# Patient Record
Sex: Male | Born: 1935 | Race: White | Hispanic: No | Marital: Married | Smoking: Never smoker
Health system: Southern US, Community
[De-identification: ages and names within clinical notes are randomized; demographics above are authoritative.]

## PROBLEM LIST (undated history)

## (undated) DIAGNOSIS — Z950 Presence of cardiac pacemaker: Secondary | ICD-10-CM

## (undated) DIAGNOSIS — R7881 Bacteremia: Secondary | ICD-10-CM

## (undated) DIAGNOSIS — I4892 Unspecified atrial flutter: Secondary | ICD-10-CM

## (undated) DIAGNOSIS — Z9981 Dependence on supplemental oxygen: Secondary | ICD-10-CM

## (undated) DIAGNOSIS — I219 Acute myocardial infarction, unspecified: Secondary | ICD-10-CM

## (undated) DIAGNOSIS — J189 Pneumonia, unspecified organism: Secondary | ICD-10-CM

## (undated) DIAGNOSIS — I2510 Atherosclerotic heart disease of native coronary artery without angina pectoris: Secondary | ICD-10-CM

## (undated) DIAGNOSIS — K5790 Diverticulosis of intestine, part unspecified, without perforation or abscess without bleeding: Secondary | ICD-10-CM

## (undated) DIAGNOSIS — Z87442 Personal history of urinary calculi: Secondary | ICD-10-CM

## (undated) DIAGNOSIS — M1990 Unspecified osteoarthritis, unspecified site: Secondary | ICD-10-CM

## (undated) DIAGNOSIS — G629 Polyneuropathy, unspecified: Secondary | ICD-10-CM

## (undated) DIAGNOSIS — C61 Malignant neoplasm of prostate: Secondary | ICD-10-CM

## (undated) DIAGNOSIS — F32A Depression, unspecified: Secondary | ICD-10-CM

## (undated) DIAGNOSIS — T8859XA Other complications of anesthesia, initial encounter: Secondary | ICD-10-CM

## (undated) DIAGNOSIS — I10 Essential (primary) hypertension: Secondary | ICD-10-CM

## (undated) DIAGNOSIS — J45909 Unspecified asthma, uncomplicated: Secondary | ICD-10-CM

## (undated) DIAGNOSIS — I472 Ventricular tachycardia: Secondary | ICD-10-CM

## (undated) DIAGNOSIS — F329 Major depressive disorder, single episode, unspecified: Secondary | ICD-10-CM

## (undated) DIAGNOSIS — I5020 Unspecified systolic (congestive) heart failure: Secondary | ICD-10-CM

## (undated) DIAGNOSIS — D649 Anemia, unspecified: Secondary | ICD-10-CM

## (undated) DIAGNOSIS — E785 Hyperlipidemia, unspecified: Secondary | ICD-10-CM

## (undated) DIAGNOSIS — G4700 Insomnia, unspecified: Secondary | ICD-10-CM

## (undated) DIAGNOSIS — T4145XA Adverse effect of unspecified anesthetic, initial encounter: Secondary | ICD-10-CM

## (undated) DIAGNOSIS — K759 Inflammatory liver disease, unspecified: Secondary | ICD-10-CM

## (undated) DIAGNOSIS — K219 Gastro-esophageal reflux disease without esophagitis: Secondary | ICD-10-CM

## (undated) DIAGNOSIS — K449 Diaphragmatic hernia without obstruction or gangrene: Secondary | ICD-10-CM

## (undated) DIAGNOSIS — I739 Peripheral vascular disease, unspecified: Secondary | ICD-10-CM

## (undated) HISTORY — DX: Acute myocardial infarction, unspecified: I21.9

## (undated) HISTORY — PX: FRACTURE SURGERY: SHX138

## (undated) HISTORY — PX: COLONOSCOPY: SHX174

## (undated) HISTORY — DX: Polyneuropathy, unspecified: G62.9

## (undated) HISTORY — PX: CATARACT EXTRACTION W/ INTRAOCULAR LENS  IMPLANT, BILATERAL: SHX1307

## (undated) HISTORY — PX: TRANSURETHRAL RESECTION OF PROSTATE: SHX73

## (undated) HISTORY — PX: CORONARY ARTERY BYPASS GRAFT: SHX141

## (undated) HISTORY — PX: KNEE ARTHROSCOPY: SHX127

## (undated) HISTORY — PX: HAMMER TOE SURGERY: SHX385

## (undated) HISTORY — PX: RHINOPLASTY: SUR1284

## (undated) HISTORY — PX: OTHER SURGICAL HISTORY: SHX169

## (undated) HISTORY — PX: JOINT REPLACEMENT: SHX530

---
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
## 1959-05-01 DIAGNOSIS — J189 Pneumonia, unspecified organism: Secondary | ICD-10-CM

## 1959-05-01 HISTORY — DX: Pneumonia, unspecified organism: J18.9

## 1998-02-28 ENCOUNTER — Other Ambulatory Visit: Admission: RE | Admit: 1998-02-28 | Discharge: 1998-02-28 | Payer: Self-pay | Admitting: Cardiology

## 1998-08-30 HISTORY — PX: FOOT FUSION: SHX956

## 1999-04-17 ENCOUNTER — Encounter: Payer: Self-pay | Admitting: *Deleted

## 1999-04-17 ENCOUNTER — Ambulatory Visit (HOSPITAL_COMMUNITY): Admission: RE | Admit: 1999-04-17 | Discharge: 1999-04-17 | Payer: Self-pay | Admitting: *Deleted

## 1999-09-25 ENCOUNTER — Encounter: Payer: Self-pay | Admitting: Orthopedic Surgery

## 1999-09-25 ENCOUNTER — Ambulatory Visit (HOSPITAL_COMMUNITY): Admission: RE | Admit: 1999-09-25 | Discharge: 1999-09-25 | Payer: Self-pay | Admitting: Orthopedic Surgery

## 1999-10-19 ENCOUNTER — Encounter: Payer: Self-pay | Admitting: Orthopedic Surgery

## 1999-10-23 ENCOUNTER — Inpatient Hospital Stay (HOSPITAL_COMMUNITY): Admission: RE | Admit: 1999-10-23 | Discharge: 1999-10-24 | Payer: Self-pay | Admitting: Orthopedic Surgery

## 1999-10-23 ENCOUNTER — Encounter: Payer: Self-pay | Admitting: Orthopedic Surgery

## 2000-08-30 HISTORY — PX: HARDWARE REMOVAL: SHX979

## 2001-08-03 ENCOUNTER — Ambulatory Visit (HOSPITAL_COMMUNITY): Admission: RE | Admit: 2001-08-03 | Discharge: 2001-08-03 | Payer: Self-pay | Admitting: Cardiology

## 2001-10-02 ENCOUNTER — Encounter: Payer: Self-pay | Admitting: Emergency Medicine

## 2001-10-02 ENCOUNTER — Emergency Department (HOSPITAL_COMMUNITY): Admission: EM | Admit: 2001-10-02 | Discharge: 2001-10-02 | Payer: Self-pay | Admitting: Emergency Medicine

## 2001-10-03 ENCOUNTER — Ambulatory Visit (HOSPITAL_COMMUNITY): Admission: RE | Admit: 2001-10-03 | Discharge: 2001-10-03 | Payer: Self-pay | Admitting: Gastroenterology

## 2001-10-03 ENCOUNTER — Encounter: Payer: Self-pay | Admitting: Gastroenterology

## 2001-10-24 ENCOUNTER — Encounter: Payer: Self-pay | Admitting: Gastroenterology

## 2001-10-24 ENCOUNTER — Ambulatory Visit (HOSPITAL_COMMUNITY): Admission: RE | Admit: 2001-10-24 | Discharge: 2001-10-24 | Payer: Self-pay | Admitting: Gastroenterology

## 2001-11-28 ENCOUNTER — Encounter: Admission: RE | Admit: 2001-11-28 | Discharge: 2001-12-21 | Payer: Self-pay | Admitting: Family Medicine

## 2003-12-27 ENCOUNTER — Encounter: Admission: RE | Admit: 2003-12-27 | Discharge: 2003-12-27 | Payer: Self-pay | Admitting: Family Medicine

## 2004-04-02 ENCOUNTER — Ambulatory Visit (HOSPITAL_BASED_OUTPATIENT_CLINIC_OR_DEPARTMENT_OTHER): Admission: RE | Admit: 2004-04-02 | Discharge: 2004-04-02 | Payer: Self-pay | Admitting: Orthopedic Surgery

## 2004-04-02 ENCOUNTER — Ambulatory Visit (HOSPITAL_COMMUNITY): Admission: RE | Admit: 2004-04-02 | Discharge: 2004-04-02 | Payer: Self-pay | Admitting: Orthopedic Surgery

## 2004-05-21 ENCOUNTER — Ambulatory Visit (HOSPITAL_COMMUNITY): Admission: RE | Admit: 2004-05-21 | Discharge: 2004-05-21 | Payer: Self-pay | Admitting: Orthopedic Surgery

## 2004-07-15 ENCOUNTER — Ambulatory Visit (HOSPITAL_BASED_OUTPATIENT_CLINIC_OR_DEPARTMENT_OTHER): Admission: RE | Admit: 2004-07-15 | Discharge: 2004-07-15 | Payer: Self-pay | Admitting: Orthopedic Surgery

## 2004-07-15 ENCOUNTER — Ambulatory Visit (HOSPITAL_COMMUNITY): Admission: RE | Admit: 2004-07-15 | Discharge: 2004-07-15 | Payer: Self-pay | Admitting: Orthopedic Surgery

## 2004-12-29 ENCOUNTER — Ambulatory Visit: Payer: Self-pay | Admitting: Family Medicine

## 2005-01-05 ENCOUNTER — Ambulatory Visit: Payer: Self-pay | Admitting: Family Medicine

## 2005-01-07 ENCOUNTER — Ambulatory Visit: Payer: Self-pay | Admitting: Family Medicine

## 2005-01-22 ENCOUNTER — Ambulatory Visit: Payer: Self-pay | Admitting: Gastroenterology

## 2005-02-09 ENCOUNTER — Ambulatory Visit: Payer: Self-pay | Admitting: Gastroenterology

## 2005-12-31 ENCOUNTER — Ambulatory Visit: Payer: Self-pay | Admitting: Family Medicine

## 2007-01-05 DIAGNOSIS — K219 Gastro-esophageal reflux disease without esophagitis: Secondary | ICD-10-CM

## 2007-01-05 DIAGNOSIS — I2583 Coronary atherosclerosis due to lipid rich plaque: Secondary | ICD-10-CM

## 2007-01-05 DIAGNOSIS — E785 Hyperlipidemia, unspecified: Secondary | ICD-10-CM | POA: Insufficient documentation

## 2007-01-05 DIAGNOSIS — I2510 Atherosclerotic heart disease of native coronary artery without angina pectoris: Secondary | ICD-10-CM

## 2007-01-05 DIAGNOSIS — K469 Unspecified abdominal hernia without obstruction or gangrene: Secondary | ICD-10-CM | POA: Insufficient documentation

## 2007-04-04 ENCOUNTER — Ambulatory Visit: Payer: Self-pay | Admitting: Family Medicine

## 2007-04-04 DIAGNOSIS — G609 Hereditary and idiopathic neuropathy, unspecified: Secondary | ICD-10-CM | POA: Insufficient documentation

## 2007-04-04 DIAGNOSIS — L408 Other psoriasis: Secondary | ICD-10-CM

## 2007-04-04 DIAGNOSIS — M810 Age-related osteoporosis without current pathological fracture: Secondary | ICD-10-CM | POA: Insufficient documentation

## 2007-04-04 DIAGNOSIS — Z8546 Personal history of malignant neoplasm of prostate: Secondary | ICD-10-CM | POA: Insufficient documentation

## 2007-04-04 DIAGNOSIS — M1990 Unspecified osteoarthritis, unspecified site: Secondary | ICD-10-CM | POA: Insufficient documentation

## 2007-04-04 DIAGNOSIS — I10 Essential (primary) hypertension: Secondary | ICD-10-CM

## 2007-04-04 LAB — CONVERTED CEMR LAB
Basophils Relative: 1.4 % — ABNORMAL HIGH (ref 0.0–1.0)
Eosinophils Absolute: 0.3 10*3/uL (ref 0.0–0.6)
Eosinophils Relative: 4.6 % (ref 0.0–5.0)
HCT: 43.3 % (ref 39.0–52.0)
Neutro Abs: 3.8 10*3/uL (ref 1.4–7.7)
TSH: 2.25 microintl units/mL (ref 0.35–5.50)
WBC: 6.9 10*3/uL (ref 4.5–10.5)

## 2007-06-18 ENCOUNTER — Inpatient Hospital Stay (HOSPITAL_COMMUNITY): Admission: EM | Admit: 2007-06-18 | Discharge: 2007-06-21 | Payer: Self-pay | Admitting: *Deleted

## 2007-06-18 ENCOUNTER — Ambulatory Visit: Payer: Self-pay | Admitting: Cardiology

## 2008-10-21 ENCOUNTER — Inpatient Hospital Stay (HOSPITAL_COMMUNITY): Admission: EM | Admit: 2008-10-21 | Discharge: 2008-10-25 | Payer: Self-pay | Admitting: Emergency Medicine

## 2009-04-14 ENCOUNTER — Inpatient Hospital Stay (HOSPITAL_COMMUNITY): Admission: AD | Admit: 2009-04-14 | Discharge: 2009-04-18 | Payer: Self-pay | Admitting: Cardiology

## 2009-04-14 ENCOUNTER — Ambulatory Visit: Payer: Self-pay | Admitting: Pulmonary Disease

## 2009-04-15 ENCOUNTER — Ambulatory Visit: Payer: Self-pay | Admitting: Internal Medicine

## 2009-04-17 ENCOUNTER — Encounter: Payer: Self-pay | Admitting: Cardiology

## 2009-05-27 ENCOUNTER — Ambulatory Visit: Payer: Self-pay | Admitting: Family Medicine

## 2010-03-30 ENCOUNTER — Ambulatory Visit: Payer: Self-pay | Admitting: Cardiology

## 2010-05-25 ENCOUNTER — Ambulatory Visit: Payer: Self-pay | Admitting: Cardiology

## 2010-06-04 ENCOUNTER — Ambulatory Visit: Payer: Self-pay | Admitting: Family Medicine

## 2010-06-29 ENCOUNTER — Ambulatory Visit: Payer: Self-pay | Admitting: Family Medicine

## 2010-07-02 LAB — CONVERTED CEMR LAB
Basophils Absolute: 0.1 10*3/uL (ref 0.0–0.1)
HCT: 39.8 % (ref 39.0–52.0)
Hemoglobin: 13.7 g/dL (ref 13.0–17.0)
Lymphs Abs: 1.5 10*3/uL (ref 0.7–4.0)
MCHC: 34.4 g/dL (ref 30.0–36.0)
Monocytes Absolute: 0.5 10*3/uL (ref 0.1–1.0)
Monocytes Relative: 9.1 % (ref 3.0–12.0)
RBC: 3.97 M/uL — ABNORMAL LOW (ref 4.22–5.81)
WBC: 5 10*3/uL (ref 4.5–10.5)

## 2010-07-29 ENCOUNTER — Ambulatory Visit: Payer: Self-pay | Admitting: Cardiology

## 2010-07-30 ENCOUNTER — Ambulatory Visit: Payer: Self-pay | Admitting: Cardiology

## 2010-09-23 ENCOUNTER — Other Ambulatory Visit: Payer: Self-pay | Admitting: Dermatology

## 2010-09-29 NOTE — Assessment & Plan Note (Signed)
Summary: FLU-SHOT/RCD  Nurse Visit   Review of Systems       Flu Vaccine Consent Questions     Do you have a history of severe allergic reactions to this vaccine? no    Any prior history of allergic reactions to egg and/or gelatin? no    Do you have a sensitivity to the preservative Thimersol? no    Do you have a past history of Guillan-Barre Syndrome? no    Do you currently have an acute febrile illness? no    Have you ever had a severe reaction to latex? no    Vaccine information given and explained to patient? yes    Are you currently pregnant? no    Lot Number:[id number]   Exp Date:02/27/2011   Site Given  Left Deltoid IM    Allergies: No Known Drug Allergies  Orders Added: 1)  Flu Vaccine 3yrs + MEDICARE PATIENTS [Q2039] 2)  Administration Flu vaccine - MCR [G0008] 

## 2010-09-29 NOTE — Assessment & Plan Note (Signed)
Summary: CPX (PT WILL COME IN FASTING) // RS   Vital Signs:  Patient profile:   77 year old male Height:      57 inches Weight:      171 pounds BMI:     37.14 Temp:     97.8 degrees F oral BP sitting:   108 / 68  (left arm) Cuff size:   regular  Vitals Entered By: Kern Reap CMA Duncan Dull) (June 29, 2010 9:26 AM) CC: annual wellness exam Is Patient Diabetic? No Pain Assessment Patient in pain? no        CC:  annual wellness exam.  History of Present Illness: [name] is an 77 year old, married man nonsmoker, who comes in today for Medicare wellness exam.  He has a history of underlying coronary artery disease.  He had bypass surgery and stents.  His cardiologist currently has him on Lasix 20 mg daily, Plavix, 75 mg daily, nitroglycerin daily, lisinopril, 10 mg daily, beta-blocker, 25 mg daily, nitroglycerin sublingual p.r.n., aspirin, potassium 20 mEq daily.  He also takes finasteride 5 mg for BPH and urinary retention, Zantac, 150 mg daily for reflux, Crestor 20 mg nightly for hyperlipidemia, Tegretol 600 mg b.i.d. for peripheral neuropathy.  He takes temazepam 15 mg nightly for sleep along with Zoloft 100 mg nightly.  He sees his dermatologist on a regular basis because history of skin cancers.  Both basal cells and squamous cells.  He was recently hospitalized in August for bacteremia, group G. strep.  Tetanus 2003, seasonal flu 2011, Pneumovax 2007, information given on shingles Here for Medicare AWV:  1.   Risk factors based on Past M, S, F history:........Marland Kitchensee above 2.   Physical Activities: walks daily, but has to use nitroglycerin after one block 3.   Depression/mood: good mood.  No depression 4.   Hearing: diminished referred to ENT 5.   ADL's: walks daily functions independently 6.   Fall Risk: reviewed.  None identified 7.   Home Safety: reviewed.  No guns in the house 8.   Height, weight, &visual acuity:height weight, normal bilateral cataracts removed with lens  implants 9.   Counseling: continue good health habits 10.   Labs ordered based on risk factors: done today 11.           Referral Coordination...Marland KitchenMarland KitchenMarland Kitchennone indicated 12.           Care Plan......Marland Kitchenreviewed all medications.  He also goes to the [name] [facility].            Cognitive Assessment   Preventive Screening-Counseling & Management  Alcohol-Tobacco     Smoking Status: never      Drug Use:  no.    Allergies (verified): No Known Drug Allergies  Past History:  Past medical, surgical, family and social histories (including risk factors) reviewed, and no changes noted (except as noted below).  Past Medical History: Reviewed history from 04/04/2007 and no changes required. GERD Hyperlipidemia Coronary artery disease Prostate cancer, hx of Hypertension Peripheral neuropathy Osteoarthritis Osteoporosis  Past Surgical History: Reviewed history from 01/05/2007 and no changes required. Transurethral resection of prostate CABS X2 PROSE CANCER L FOOT SURG R TOE SURG   Family History: Reviewed history and no changes required.  Social History: Reviewed history and no changes required. Retired Married Never Smoked Alcohol use-no Drug use-no  Review of Systems      See HPI  Physical Exam  General:  Well-developed,well-nourished,in no acute distress; alert,appropriate and cooperative throughout examination Head:  Normocephalic and atraumatic without obvious  abnormalities. No apparent alopecia or balding. Eyes:  No corneal or conjunctival inflammation noted. EOMI. Perrla. Funduscopic exam benign, without hemorrhages, exudates or papilledema. Vision grossly normal...Marland KitchenMarland KitchenMarland Kitchenbilateral lens implants Ears:  External ear exam shows no significant lesions or deformities.  Otoscopic examination reveals clear canals, tympanic membranes are intact bilaterally without bulging, retraction, inflammation or discharge. Hearing is grossly normal bilaterally. Nose:  External nasal examination shows no  deformity or inflammation. Nasal mucosa are pink and moist without lesions or exudates. Mouth:  Oral mucosa and oropharynx without lesions or exudates.  Teeth in good repair. Neck:  No deformities, masses, or tenderness noted. Chest Wall:  No deformities, masses, tenderness or gynecomastia noted. Breasts:  No masses or gynecomastia noted Lungs:  Normal respiratory effort, chest expands symmetrically. Lungs are clear to auscultation, no crackles or wheezes. Heart:  PMI not palpable.  Heart sounds distant.  No obvious murmur Abdomen:  Bowel sounds positive,abdomen soft and non-tender without masses, organomegaly or hernias noted. Msk:  No deformity or scoliosis noted of thoracic or lumbar spine.   Pulses:  R and L carotid,radial,femoral,dorsalis pedis and posterior tibial pulses are full and equal bilaterally Extremities:  No clubbing, cyanosis, edema, or deformity noted with normal full range of motion of all joints.   Neurologic:  No cranial nerve deficits noted. Station and gait are normal. Plantar reflexes are down-going bilaterally. DTRs are symmetrical throughout. Sensory, motor and coordinative functions appear intact. Skin:  Intact without suspicious lesions or rashes Cervical Nodes:  No lymphadenopathy noted Axillary Nodes:  No palpable lymphadenopathy Inguinal Nodes:  No significant adenopathy Psych:  Cognition and judgment appear intact. Alert and cooperative with normal attention span and concentration. No apparent delusions, illusions, hallucinations   Impression & Recommendations:  Problem # 1:  HYPERTENSION (ICD-401.9) Assessment Improved  His updated medication list for this problem includes:    Lisinopril 20 Mg Tabs (Lisinopril) .[name]... Take o1/2 tablet by mouth daily    Metoprolol Succinate 50 Mg Tb24 (Metoprolol succinate) .[name]... Take 1/2 tablet by mouth daily    Furosemide 20 Mg Tabs (Furosemide) .[name]... Take one tab by mouth once daily  Orders: Venipuncture  (29562) TLB-CBC Platelet - w/Differential (85025-CBCD) TLB-TSH (Thyroid Stimulating Hormone) (84443-TSH) Medicare -1st Annual Wellness Visit [phone])  Problem # 2:  PERIPHERAL NEUROPATHY (ICD-356.9) Assessment: Improved  Orders: Venipuncture (57846) TLB-CBC Platelet - w/Differential (85025-CBCD) TLB-TSH (Thyroid Stimulating Hormone) (84443-TSH) Medicare -1st Annual Wellness Visit [phone])  Problem # 3:  CORONARY ARTERY DISEASE (ICD-414.00) Assessment: Unchanged  His updated medication list for this problem includes:    Isosorbide Mononitrate Cr 60 Mg Tb24 (Isosorbide mononitrate) .[name]... Take one tablet by mouth daily    Lisinopril 20 Mg Tabs (Lisinopril) .[name]... Take o1/2 tablet by mouth daily    Metoprolol Succinate 50 Mg Tb24 (Metoprolol succinate) .[name]... Take 1/2 tablet by mouth daily    Nitro-dur 0.4 Mg/hr Pt24 (Nitroglycerin) ..... One sl as needed    [employer] Aspirin 325 Mg Tabs (Aspirin) ..... One daily    Furosemide 20 Mg Tabs (Furosemide) .[name]... Take one tab by mouth once daily    Plavix 75 Mg Tabs (Clopidogrel bisulfate) .[name]... Take one tab by mouth once daily  Orders: Venipuncture (28413) TLB-CBC Platelet - w/Differential (85025-CBCD) TLB-TSH (Thyroid Stimulating Hormone) (84443-TSH) Medicare -1st Annual Wellness Visit [phone])  Problem # 4:  HYPERLIPIDEMIA (ICD-272.4) Assessment: Improved  The following medications were removed from the medication list:    Simvastatin 80 Mg Tabs (Simvastatin) .[name]... Take 1/2 tablet by mouth daily His updated medication list  for this problem includes:    Crestor 20 Mg Tabs (Rosuvastatin calcium) .[name]... Take one tab by mouth once daily  Orders: Venipuncture (09811) TLB-CBC Platelet - w/Differential (85025-CBCD) TLB-TSH (Thyroid Stimulating Hormone) (84443-TSH) Medicare -1st Annual Wellness Visit [phone])  Problem # 5:  Preventive Health Care (ICD-V70.0) Assessment: Unchanged  Complete Medication List: 1)  Isosorbide Mononitrate Cr 60  Mg Tb24 (Isosorbide mononitrate) .... Take one tablet by mouth daily 2)  Lisinopril 20 Mg Tabs (Lisinopril) .... Take o1/2 tablet by mouth daily 3)  Metoprolol Succinate 50 Mg Tb24 (Metoprolol succinate) .... Take 1/2 tablet by mouth daily 4)  Omeprazole 20 Mg Tbec (Omeprazole) .... Take one tablet by mouth q am 5)  Temazepam 15 Mg Caps (Temazepam) .... Take one capsule by mouth at bedtime 6)  Nitro-dur 0.4 Mg/hr Pt24 (Nitroglycerin) .... One sl as needed 7)  [employer] Aspirin 325 Mg Tabs (Aspirin) .... One daily 8)  Calcium 600/vitamin D 600-200 Mg-unit Tabs (Calcium carbonate-vitamin d) .... One daily 9)  Triamcinolone Acetonide 0.1 % Lotn (Triamcinolone acetonide) .... Twice daily 10)  Glucosamine-chondroitin 1500-1200 Mg/88ml Liqd (Glucosamine-chondroitin) .... One daily 11)  Furosemide 20 Mg Tabs (Furosemide) .... Take one tab by mouth once daily 12)  Finasteride 5 Mg Tabs (Finasteride) .... Take one tab by mouth once daily 13)  Ranitidine Hcl 150 Mg Caps (Ranitidine hcl) .... Take one cap by mouth once daily 14)  Crestor 20 Mg Tabs (Rosuvastatin calcium) .... Take one tab by mouth once daily 15)  Plavix 75 Mg Tabs (Clopidogrel bisulfate) .... Take one tab by mouth once daily 16)  Carbatrol 300 Mg Xr12h-cap (Carbamazepine) .... Take 2 tabs by mouth two times a day 17)  Klor-con M20 20 Meq Cr-tabs (Potassium chloride crys cr) .... Take one tab by mouth once daily 18)  Sertraline Hcl 100 Mg Tabs (Sertraline hcl) .... Take one tab by mouth once daily 19)  Atrovent Hfa 17 Mcg/act Aers (Ipratropium bromide hfa) .... Use 2 puffs as needed  Patient Instructions: 1)  Please schedule a follow-up appointment in 1 year.   Orders Added: 1)  Venipuncture [29562] 2)  TLB-CBC Platelet - w/Differential [85025-CBCD] 3)  TLB-TSH (Thyroid Stimulating Hormone) [84443-TSH] 4)  Medicare -1st Annual Wellness Visit [G0438]

## 2010-11-03 ENCOUNTER — Encounter: Payer: Self-pay | Admitting: Cardiology

## 2010-11-03 DIAGNOSIS — I5021 Acute systolic (congestive) heart failure: Secondary | ICD-10-CM | POA: Insufficient documentation

## 2010-11-03 DIAGNOSIS — J45909 Unspecified asthma, uncomplicated: Secondary | ICD-10-CM | POA: Insufficient documentation

## 2010-11-03 DIAGNOSIS — I259 Chronic ischemic heart disease, unspecified: Secondary | ICD-10-CM | POA: Insufficient documentation

## 2010-11-03 DIAGNOSIS — J9610 Chronic respiratory failure, unspecified whether with hypoxia or hypercapnia: Secondary | ICD-10-CM | POA: Insufficient documentation

## 2010-11-03 DIAGNOSIS — R42 Dizziness and giddiness: Secondary | ICD-10-CM | POA: Insufficient documentation

## 2010-11-03 DIAGNOSIS — G629 Polyneuropathy, unspecified: Secondary | ICD-10-CM | POA: Insufficient documentation

## 2010-11-03 DIAGNOSIS — I219 Acute myocardial infarction, unspecified: Secondary | ICD-10-CM | POA: Insufficient documentation

## 2010-11-03 DIAGNOSIS — E785 Hyperlipidemia, unspecified: Secondary | ICD-10-CM | POA: Insufficient documentation

## 2010-11-25 ENCOUNTER — Other Ambulatory Visit (INDEPENDENT_AMBULATORY_CARE_PROVIDER_SITE_OTHER): Payer: Medicare Other | Admitting: *Deleted

## 2010-11-25 DIAGNOSIS — R0789 Other chest pain: Secondary | ICD-10-CM

## 2010-11-25 DIAGNOSIS — E7800 Pure hypercholesterolemia, unspecified: Secondary | ICD-10-CM

## 2010-11-25 LAB — COMPREHENSIVE METABOLIC PANEL
ALT: 14 U/L (ref 0–53)
AST: 26 U/L (ref 0–37)
Albumin: 4.1 g/dL (ref 3.5–5.2)
Alkaline Phosphatase: 73 U/L (ref 39–117)
Creat: 0.98 mg/dL (ref 0.40–1.50)
Sodium: 138 mEq/L (ref 135–145)
Total Protein: 7.4 g/dL (ref 6.0–8.3)

## 2010-11-25 LAB — LIPID PANEL
HDL: 62 mg/dL (ref >39)
LDL Cholesterol: 93 mg/dL (ref 0–99)
Total CHOL/HDL Ratio: 2.8 Ratio

## 2010-11-30 ENCOUNTER — Ambulatory Visit (INDEPENDENT_AMBULATORY_CARE_PROVIDER_SITE_OTHER): Payer: Medicare Other | Admitting: Cardiology

## 2010-11-30 ENCOUNTER — Encounter: Payer: Self-pay | Admitting: Cardiology

## 2010-11-30 DIAGNOSIS — R42 Dizziness and giddiness: Secondary | ICD-10-CM

## 2010-11-30 DIAGNOSIS — I259 Chronic ischemic heart disease, unspecified: Secondary | ICD-10-CM

## 2010-11-30 DIAGNOSIS — I2510 Atherosclerotic heart disease of native coronary artery without angina pectoris: Secondary | ICD-10-CM

## 2010-11-30 DIAGNOSIS — E785 Hyperlipidemia, unspecified: Secondary | ICD-10-CM

## 2010-11-30 NOTE — Assessment & Plan Note (Signed)
We reviewed his labs which are satisfactory.  Continue same dose of Crestor 20 mg daily.

## 2010-11-30 NOTE — Progress Notes (Signed)
History of Present Illness: This 77 year old gentleman is seen for a scheduled 4 month followup office visit.  He has a history of significant ischemic heart disease he has had a history of multiple myocardial infarctions in the past.  He had his initial CABG in 1986.  He had a redo CABG in 1993.  He had PTCA with stent in October of 2008.  And he had a drug-eluting stent in February 2010.  That was his last cardiac catheterization.  Her last stress test was 10/27/09 and he does have a known abnormal scan with moderate area of reversible ischemia in the inferolateral wall with an ejection fraction of 64%.  Slight the findings on stress test we felt further cardiac catheter was not indicated since his symptoms were being well controlled with medication including sublingual he is known to have limited targets remaining.  Current Outpatient Prescriptions  Medication Sig Dispense Refill  . ALBUTEROL IN Inhale into the lungs as needed.        [name] aspirin 325 MG tablet Take 325 mg by mouth daily.        . calcium-vitamin D (OSCAL WITH D 500-200) 500-200 MG-UNIT per tablet Take 1 tablet by mouth 2 (two) times daily.        . CarBAMazepine (TEGRETOL PO) Take 300 mg by mouth 2 (two) times daily.       . clopidogrel (PLAVIX) 75 MG tablet Take 75 mg by mouth daily.        . finasteride (PROSCAR) 5 MG tablet Take 5 mg by mouth daily.        . furosemide (LASIX) 40 MG tablet Take 20 mg by mouth daily.        . GuaiFENesin (MUCINEX PO) Take by mouth as needed.        . isosorbide mononitrate (IMDUR) 60 MG 24 hr tablet Take 60 mg by mouth daily.        [name] L-Methylfolate-B6-B12 (METANX PO) Take by mouth 2 (two) times daily as needed.        . metoprolol (TOPROL-XL) 50 MG 24 hr tablet Take 50 mg by mouth daily.        . nitroGLYCERIN (NITROSTAT) 0.4 MG SL tablet Place 0.4 mg under the tongue every 5 (five) minutes as needed.        . Polyethylene Glycol 3350 (MIRALAX PO) Take by mouth as needed.        . potassium  chloride SA (K-DUR,KLOR-CON) 20 MEQ tablet Take 20 mEq by mouth daily.        . ranitidine (ZANTAC) 150 MG tablet Take 150 mg by mouth daily.        . rosuvastatin (CRESTOR) 20 MG tablet Take 20 mg by mouth daily.        . sertraline (ZOLOFT) 100 MG tablet Take 100 mg by mouth daily.        . temazepam (RESTORIL) 15 MG capsule Take 15 mg by mouth at bedtime.        . Pseudoephedrine-Guaifenesin (MUCINEX D PO) Take by mouth.          No Known Allergies  Patient Active Problem List  Diagnoses  . HYPERLIPIDEMIA  . PERIPHERAL NEUROPATHY  . HYPERTENSION  . Cor Athrscl-Uns Vessel  . GERD  . HERNIA  . PSORIASIS  . OSTEOARTHRITIS  . OSTEOPOROSIS  . PROSTATE CANCER, HX OF  . Ischemic heart disease  . Myocardial infarction  . CHF (congestive heart failure)  . Hypoxia  . COPD (  chronic obstructive pulmonary disease)  . Peripheral neuropathy  . Dizziness  . Dyslipidemia (high LDL; low HDL)    History  Smoking status  . Never Smoker   Smokeless tobacco  . Not on file    History  Alcohol Use No    Family History  Problem Relation Age of Onset  . Heart attack Mother   . Addison's disease Father   . Heart attack Sister     Review of Systems: Constitutional: no fever chills diaphoresis or fatigue or change in weight.  Head and neck: no hearing loss, no epistaxis, no photophobia or visual disturbance. Respiratory: No cough, shortness of breath or wheezing. Cardiovascular: No chest pain peripheral edema, palpitations. Gastrointestinal: No abdominal distention, no abdominal pain, no change in bowel habits hematochezia or melena. Genitourinary: No dysuria, no frequency, no urgency, no nocturia. Musculoskeletal:No arthralgias, no back pain, no gait disturbance or myalgias. Neurological: No dizziness, no headaches, no numbness, no seizures, no syncope, no weakness, no tremors. Hematologic: No lymphadenopathy, no easy bruising. Psychiatric: No confusion, no hallucinations, no sleep  disturbance.    Physical Exam: Filed Vitals:   11/30/10 0921  BP: 110/60  Pulse: 64  Weight is 167, down 4 pounds.Pupils equal and reactive.   Extraocular Movements are full.  There is no scleral icterus.  The mouth and pharynx are normal.  The neck is supple.  The carotids reveal no bruits.  The jugular venous pressure is normal.  The thyroid is not enlarged.  There is no lymphadenopathy.The chest is clear to percussion and auscultation. There are no rales or rhonchi. Expansion of the chest is symmetrical.The precordium is quiet.  The first heart sound is normal.  The second heart sound is physiologically split.  There is no murmur gallop rub or click.  There is no abnormal lift or heave.The abdomen is soft and nontender. Bowel sounds are normal. The liver and spleen are not enlarged. There Are no abdominal masses. There are no bruits.The pedal pulses are good.  There is no phlebitis or edema.  There is no cyanosis or clubbing.The skin is warm and dry.  There is no rash.Strength is normal and symmetrical in all extremities.  There is no lateralizing weakness.  There are no sensory deficits.   Assessment / Plan:

## 2010-11-30 NOTE — Assessment & Plan Note (Addendum)
The patient has been having episodes of dizziness off and in the morning after he takes his medication frequently his blood pressure systolic would drop to 85.  Sometimes he will become diaphoretic and had to lie down on the bed.  He has not been experiencing any increase in chest pain or angina.He wonders whether a change in the timing of his medicines might help and I certainly agree that that would be reasonable.  Instead of taking his and/or first thing in the morning he will take it at lunchtime and see if the hypotensive episodes decrease.

## 2010-11-30 NOTE — Assessment & Plan Note (Signed)
The frequency and severity of his angina pectoris has remained stable.  Continue present regimen.

## 2010-12-05 LAB — COMPREHENSIVE METABOLIC PANEL
ALT: 21 U/L (ref 0–53)
AST: 42 U/L — ABNORMAL HIGH (ref 0–37)
AST: 43 U/L — ABNORMAL HIGH (ref 0–37)
BUN: 14 mg/dL (ref 6–23)
CO2: 26 mEq/L (ref 19–32)
CO2: 26 mEq/L (ref 19–32)
Calcium: 8.8 mg/dL (ref 8.4–10.5)
Chloride: 103 mEq/L (ref 96–112)
Chloride: 105 mEq/L (ref 96–112)
Creatinine, Ser: 1.16 mg/dL (ref 0.4–1.5)
Creatinine, Ser: 1.23 mg/dL (ref 0.4–1.5)
GFR calc Af Amer: >60 mL/min (ref >60)
GFR calc Af Amer: >60 mL/min (ref >60)
GFR calc non Af Amer: 56 mL/min — ABNORMAL LOW (ref >60)
GFR calc non Af Amer: >60 mL/min (ref >60)
Glucose, Bld: 119 mg/dL — ABNORMAL HIGH (ref 70–99)
Total Bilirubin: 0.6 mg/dL (ref 0.3–1.2)
Total Bilirubin: 0.8 mg/dL (ref 0.3–1.2)

## 2010-12-05 LAB — BASIC METABOLIC PANEL
BUN: 11 mg/dL (ref 6–23)
CO2: 28 mEq/L (ref 19–32)
Calcium: 8.2 mg/dL — ABNORMAL LOW (ref 8.4–10.5)
Calcium: 8.4 mg/dL (ref 8.4–10.5)
Creatinine, Ser: 0.88 mg/dL (ref 0.4–1.5)
Creatinine, Ser: 1 mg/dL (ref 0.4–1.5)
GFR calc Af Amer: >60 mL/min (ref >60)
Glucose, Bld: 121 mg/dL — ABNORMAL HIGH (ref 70–99)

## 2010-12-05 LAB — CBC
HCT: 35.6 % — ABNORMAL LOW (ref 39.0–52.0)
HCT: 35.8 % — ABNORMAL LOW (ref 39.0–52.0)
HCT: 36.8 % — ABNORMAL LOW (ref 39.0–52.0)
Hemoglobin: 12.2 g/dL — ABNORMAL LOW (ref 13.0–17.0)
Hemoglobin: 12.4 g/dL — ABNORMAL LOW (ref 13.0–17.0)
MCHC: 34.2 g/dL (ref 30.0–36.0)
MCHC: 34.3 g/dL (ref 30.0–36.0)
MCHC: 34.8 g/dL (ref 30.0–36.0)
MCV: 100.6 fL — ABNORMAL HIGH (ref 78.0–100.0)
MCV: 101.3 fL — ABNORMAL HIGH (ref 78.0–100.0)
MCV: 99.3 fL (ref 78.0–100.0)
Platelets: 168 10*3/uL (ref 150–400)
RBC: 3.55 MIL/uL — ABNORMAL LOW (ref 4.22–5.81)
RBC: 3.59 MIL/uL — ABNORMAL LOW (ref 4.22–5.81)
RDW: 13.3 % (ref 11.5–15.5)
WBC: 11.7 10*3/uL — ABNORMAL HIGH (ref 4.0–10.5)
WBC: 13 10*3/uL — ABNORMAL HIGH (ref 4.0–10.5)

## 2010-12-05 LAB — C-REACTIVE PROTEIN: CRP: 0.7 mg/dL — ABNORMAL HIGH (ref <0.6)

## 2010-12-05 LAB — URINE CULTURE
Colony Count: NO GROWTH
Culture: NO GROWTH

## 2010-12-05 LAB — BLOOD GAS, ARTERIAL
Acid-base deficit: 0.4 mmol/L (ref 0.0–2.0)
Drawn by: 24485
FIO2: 0.36 %
O2 Content: 4 L/min
pCO2 arterial: 32.7 mmHg — ABNORMAL LOW (ref 35.0–45.0)
pH, Arterial: 7.46 — ABNORMAL HIGH (ref 7.350–7.450)

## 2010-12-05 LAB — BRAIN NATRIURETIC PEPTIDE
Pro B Natriuretic peptide (BNP): 213 pg/mL — ABNORMAL HIGH (ref 0.0–100.0)
Pro B Natriuretic peptide (BNP): 373 pg/mL — ABNORMAL HIGH (ref 0.0–100.0)

## 2010-12-05 LAB — URINALYSIS, ROUTINE W REFLEX MICROSCOPIC
Glucose, UA: NEGATIVE mg/dL
Hgb urine dipstick: NEGATIVE
pH: 5.5 (ref 5.0–8.0)

## 2010-12-05 LAB — CULTURE, BLOOD (ROUTINE X 2): Culture: NO GROWTH

## 2010-12-05 LAB — SEDIMENTATION RATE: Sed Rate: 4 mm/hr (ref 0–16)

## 2010-12-05 LAB — CARDIAC PANEL(CRET KIN+CKTOT+MB+TROPI): Troponin I: 0.52 ng/mL (ref 0.00–0.06)

## 2010-12-05 LAB — ROCKY MTN SPOTTED FVR AB, IGG-BLOOD: RMSF IgG: 0.1 IV

## 2010-12-05 LAB — ROCKY MTN SPOTTED FVR AB, IGM-BLOOD: RMSF IgM: 0.35 IV (ref 0.00–0.89)

## 2010-12-15 LAB — CBC
HCT: 35.9 % — ABNORMAL LOW (ref 39.0–52.0)
HCT: 37.2 % — ABNORMAL LOW (ref 39.0–52.0)
HCT: 39.6 % (ref 39.0–52.0)
HCT: 40.3 % (ref 39.0–52.0)
HCT: 41.4 % (ref 39.0–52.0)
Hemoglobin: 13.8 g/dL (ref 13.0–17.0)
Hemoglobin: 14.1 g/dL (ref 13.0–17.0)
Hemoglobin: 14.4 g/dL (ref 13.0–17.0)
MCV: 99.5 fL (ref 78.0–100.0)
MCV: 99.7 fL (ref 78.0–100.0)
Platelets: 165 10*3/uL (ref 150–400)
Platelets: 178 10*3/uL (ref 150–400)
Platelets: 243 10*3/uL (ref 150–400)
RBC: 4.08 MIL/uL — ABNORMAL LOW (ref 4.22–5.81)
RDW: 13 % (ref 11.5–15.5)
RDW: 13.2 % (ref 11.5–15.5)
RDW: 13.3 % (ref 11.5–15.5)
WBC: 11.8 10*3/uL — ABNORMAL HIGH (ref 4.0–10.5)
WBC: 13.2 10*3/uL — ABNORMAL HIGH (ref 4.0–10.5)
WBC: 5.9 10*3/uL (ref 4.0–10.5)
WBC: 6.7 10*3/uL (ref 4.0–10.5)
WBC: 6.9 10*3/uL (ref 4.0–10.5)

## 2010-12-15 LAB — COMPREHENSIVE METABOLIC PANEL
ALT: 33 U/L (ref 0–53)
AST: 71 U/L — ABNORMAL HIGH (ref 0–37)
Albumin: 3 g/dL — ABNORMAL LOW (ref 3.5–5.2)
Albumin: 3.3 g/dL — ABNORMAL LOW (ref 3.5–5.2)
Alkaline Phosphatase: 74 U/L (ref 39–117)
BUN: 10 mg/dL (ref 6–23)
BUN: 14 mg/dL (ref 6–23)
Chloride: 106 mEq/L (ref 96–112)
Chloride: 99 mEq/L (ref 96–112)
Creatinine, Ser: 0.9 mg/dL (ref 0.4–1.5)
GFR calc Af Amer: >60 mL/min (ref >60)
Glucose, Bld: 93 mg/dL (ref 70–99)
Potassium: 3.9 mEq/L (ref 3.5–5.1)
Sodium: 131 mEq/L — ABNORMAL LOW (ref 135–145)
Total Bilirubin: 0.6 mg/dL (ref 0.3–1.2)
Total Bilirubin: 0.9 mg/dL (ref 0.3–1.2)

## 2010-12-15 LAB — BASIC METABOLIC PANEL
BUN: 8 mg/dL (ref 6–23)
Chloride: 105 mEq/L (ref 96–112)
Potassium: 3.9 mEq/L (ref 3.5–5.1)

## 2010-12-15 LAB — URINE MICROSCOPIC-ADD ON

## 2010-12-15 LAB — DIFFERENTIAL
Basophils Absolute: 0.1 10*3/uL (ref 0.0–0.1)
Basophils Relative: 1 % (ref 0–1)
Lymphocytes Relative: 22 % (ref 12–46)
Monocytes Absolute: 0.5 10*3/uL (ref 0.1–1.0)
Neutro Abs: 3.9 10*3/uL (ref 1.7–7.7)
Neutrophils Relative %: 67 % (ref 43–77)

## 2010-12-15 LAB — URINALYSIS, ROUTINE W REFLEX MICROSCOPIC
Bilirubin Urine: NEGATIVE
Protein, ur: 30 mg/dL — AB
Specific Gravity, Urine: 1.019 (ref 1.005–1.030)
Urobilinogen, UA: 1 mg/dL (ref 0.0–1.0)

## 2010-12-15 LAB — URINE CULTURE

## 2010-12-15 LAB — LIPID PANEL
HDL: 51 mg/dL (ref >39)
Total CHOL/HDL Ratio: 3.2 RATIO
Triglycerides: 65 mg/dL (ref <150)
VLDL: 13 mg/dL (ref 0–40)

## 2010-12-15 LAB — CARDIAC PANEL(CRET KIN+CKTOT+MB+TROPI)
CK, MB: 59.9 ng/mL — ABNORMAL HIGH (ref 0.3–4.0)
CK, MB: 91.9 ng/mL — ABNORMAL HIGH (ref 0.3–4.0)
Relative Index: 11.6 — ABNORMAL HIGH (ref 0.0–2.5)
Relative Index: 5.9 — ABNORMAL HIGH (ref 0.0–2.5)
Total CK: 659 U/L — ABNORMAL HIGH (ref 7–232)
Troponin I: 12.67 ng/mL (ref 0.00–0.06)
Troponin I: 16.57 ng/mL (ref 0.00–0.06)

## 2010-12-15 LAB — BRAIN NATRIURETIC PEPTIDE: Pro B Natriuretic peptide (BNP): 97 pg/mL (ref 0.0–100.0)

## 2010-12-15 LAB — D-DIMER, QUANTITATIVE: D-Dimer, Quant: 0.67 ug/mL-FEU — ABNORMAL HIGH (ref 0.00–0.48)

## 2010-12-15 LAB — PROTIME-INR: Prothrombin Time: 14.2 seconds (ref 11.6–15.2)

## 2010-12-15 LAB — HEPARIN LEVEL (UNFRACTIONATED): Heparin Unfractionated: 0.34 IU/mL (ref 0.30–0.70)

## 2010-12-15 LAB — HEMOGLOBIN A1C
Hgb A1c MFr Bld: 5.7 % (ref 4.6–6.1)
Mean Plasma Glucose: 117 mg/dL

## 2010-12-15 LAB — CK TOTAL AND CKMB (NOT AT ARMC): Total CK: 124 U/L (ref 7–232)

## 2010-12-16 ENCOUNTER — Encounter: Payer: Self-pay | Admitting: Cardiology

## 2010-12-30 ENCOUNTER — Other Ambulatory Visit: Payer: Self-pay | Admitting: *Deleted

## 2010-12-31 ENCOUNTER — Other Ambulatory Visit: Payer: Self-pay | Admitting: *Deleted

## 2010-12-31 DIAGNOSIS — G4700 Insomnia, unspecified: Secondary | ICD-10-CM

## 2010-12-31 MED ORDER — TEMAZEPAM 15 MG PO CAPS: 15.0000 mg | ORAL_CAPSULE | Freq: Every evening | ORAL | Status: DC | PRN

## 2010-12-31 NOTE — Telephone Encounter (Signed)
Refilled meds per fax request.  

## 2011-01-08 ENCOUNTER — Other Ambulatory Visit: Payer: Self-pay | Admitting: *Deleted

## 2011-01-08 MED ORDER — NITROGLYCERIN 0.4 MG SL SUBL: 0.4000 mg | SUBLINGUAL_TABLET | SUBLINGUAL | Status: DC | PRN

## 2011-01-08 NOTE — Telephone Encounter (Signed)
Refilled meds per fax request.  

## 2011-01-12 NOTE — Discharge Summary (Signed)
NAMEMarland Kitchen  [name], [name] NO.:  [id number]   MEDICAL RECORD NO.:  [id number]          PATIENT TYPE:  INP   LOCATION:  2011                         FACILITY:  [facility]   PHYSICIAN:  Cassell Clement, M.D. DATE OF BIRTH:  [birth year]   DATE OF ADMISSION:  06/18/2007  DATE OF DISCHARGE:  06/21/2007                               DISCHARGE SUMMARY   FINAL DIAGNOSES:  1. Coronary artery disease with acute myocardial infarction secondary      to occluded saphenous vein graft to the obtuse marginal 2/3 and      with Fetch thrombectomy and insertion of a 3.0 Vision stent to the      occluded saphenous vein graft to the obtuse marginal.  2. Known coronary artery disease with previous coronary artery bypass      graft surgery in 1986 with a redo coronary artery bypass grafting      in 1993.  3. Severe disease of native coronary arteries.  4. Hyperlipidemia.  5. Osteoarthritis and osteoporosis.  6. Past history of prostate cancer status post transurethral resection      of the prostate.  7. Gastroesophageal reflux disease.  8. Chronic constipation.   OPERATIONS PERFORMED:  Emergency cardiac catheterization by Dr. Donato Schultz and interventional cardiology PCI by Dr. Bonnee Quin.   HISTORY:  This pleasant 77 year old gentleman with known coronary artery  disease came to [facility] emergency room with chest pain which began  while he was at church at about 9:00 a.m. Sunday morning, October 19.  Pain began while walking and failed to resolve when he sat down.  He  took nitroglycerin with very little relief, and after taking 2  nitroglycerin, he left church and called the on-call physician, Dr.  Anne Fu, who advised him to come to the emergency department.  In the  emergency apartment, pain had reduced from 9/10 to 4-5/10.  He was  started on the IV nitroglycerin drip and received additional aspirin in  the emergency room.   The details of the history, past medical history, past  surgical history,  operations, allergies, medications, social history and review of systems  are as noted on Dr. Anne Fu detailed note.   PHYSICAL EXAMINATION:  On admission showed a blood pressure of 154/78  which was high for him, pulse 58, O2 saturations were 99%.  He is alert  and oriented.  SKIN:  Is well-perfused.  LUNGS:  Are clear.  The heart reveals a soft systolic murmur at the apex and base.  There  was no gallop.  ABDOMEN:  Negative.  EXTREMITIES:  No phlebitis.  Pedal pulses were difficult to palpate.   His electrocardiogram showed a chronic left bundle branch block with  right axis deviation.  Repeat showed normal axis with a left bundle  branch block.  His initial set of biomarkers were drawn at 12:41 p.m.  He was initially felt to be a satisfactory candidate for admission and  for catheterization the following day.  However, the biomarkers did show  elevation with a troponin of 2.5 and a MB of 50, and at 7:00 p.m. in  the  evening, he was still having pain of 5/10 on a nitroglycerin drip, and  therefore, a discussion was made with interventional cardiologist, Dr.  Pete Glatter and Dr. Anne Fu, and it was felt that he should be taken  urgently to the catheterization lab.  He was given Plavix precath and  was taken to the catheterization lab where he was found to have an  occluded saphenous vein graft to the obtuse marginal 2/3.  Dr. Riley Kill  intervened with a Fetch thrombectomy, removed a large thrombus from the  saphenous vein graft and then applied a 3.0 Vision stent with reduction  of his occlusion from 100% to 0%.  The patient tolerated the procedure  well.  He did have a large bump in his enzymes, however, with CK-MB of  305 and a troponin of more than 100 at 1:00 a.m. on Monday morning,  June 19, 2007.  Clinically, he did not show any evidence of  congestive heart failure or cardiogenic shock.  His activity was  gradually increased.  His rhythm remained stable, left  bundle branch  block.  He was placed back on Tegretol which he had been on  preoperatively for his peripheral neuropathy in a dose of 200 mg twice a  day, and with the Tegretol, he had a slight increase in his PR interval,  but it increased only to a maximum of 202 milliseconds.  Phase 1 cardiac  rehab was instituted.  A two-dimensional echocardiogram was requested on  June 20, 2007 but was not accomplished and will be done as an  outpatient.  At the time of discharge, vital signs are stable. At  discharge, blood pressure is 107/63, pulse 68, temperature 97, O2 sat is  96% on room air.   ACCESSORY LABORATORY DATA:  CBC on discharge is 13.3.  Sodium 134,  potassium 4.3, BUN 12, creatinine 1.12.  INR is 1.0.  Liver function  studies are normal.  One day prior to discharge, troponin was down to  19, total CK of 562 with CK-MB of 25.  TSH was 2.587.  B natriuretic  peptide is 112.   The patient is being discharged on a low-sodium, heart-healthy diet on  June 21, 2007.  He was seen in the office in 1 week for follow-up  office visit and EKG.   DISCHARGE MEDICATIONS:  1. Toprol XL 25 one daily.  2. Aspirin 325 daily.  3. Plavix 75 mg daily.  4. Zocor 40 mg daily.  5. Imdur 60 mg daily.  6. Stool softener 100 mg 2 daily.  7. Actonel 35 mg weekly.  8. Calcium with vitamin D 500 mg twice a day.  9. MiraLax 17 grams daily p.r.n. for constipation.  10.Plavix 75 mg daily.  11.Generic Restoril 15 mg h.s. p.r.n. for sleep.  12.Omeprazole 20 mg p.o. daily.  13.Tegretol generic 200 mg twice a day for peripheral neuropathy.  14.Senokot-S 1 daily at bedtime.  15.Nitrostat 150 sublingual p.r.n.   On this admission, his cholesterol was 131, LDL 85, HDL 35,  triglycerides 57.   The patient is not being discharged on ACE inhibitor because of a  previous intolerance with hypotension.   CONDITION ON DISCHARGE:  Is improved.           ______________________________  Cassell Clement,  M.D.     TB/MEDQ  D:  06/21/2007  T:  06/21/2007  Job:  [id number]   cc:   Gilmer Mor, M.D.

## 2011-01-12 NOTE — Discharge Summary (Signed)
NAMEMarland Perez  [name], [name] NO.:  [id number]   MEDICAL RECORD NO.:  [id number]          PATIENT TYPE:  INP   LOCATION:  2023                         FACILITY:  [facility]   PHYSICIAN:  Cassell Clement, M.D. DATE OF BIRTH:  1925-04-23   DATE OF ADMISSION:  04/14/2009  DATE OF DISCHARGE:  04/18/2009                               DISCHARGE SUMMARY   DISCHARGE DIAGNOSES:  1. Bacteremia and fever secondary to group G streptococcus, PENICILLIN      sensitive.  2. Chest pain with slight bump in cardiac enzymes consistent with very      small non-Q myocardial infarction.  3. Dyspnea with elevated B-natriuretic peptide consistent with      congestive heart failure with normal left ventricular systolic      function and mild mitral regurgitation.  4. Known ischemic heart disease, status post coronary artery bypass      graft surgery in 1986 and redo coronary artery bypass graft surgery      in 1993, and stenting of vein graft in February 2010, to his      diagonal using a drug-eluting stent.  5. Hyperlipidemia.  6. Past history of prostate cancer and past history of prostatitis and      epididymitis, followed by Dr. Aldean Ast.   OPERATIONS PERFORMED:  CT angiogram of the chest.   HISTORY:  This 77 year old married Caucasian gentleman presented as an  urgent walk-in in my office at noon on April 14, 2009.  He complained  of sudden onset of left-sided chest pain radiating to the center of his  chest and also into the left upper back.  Pain was sudden in onset, but  grew gradually worse.  He did not take any nitroglycerin at home because  he could not find his nitroglycerin bottle.  He presented to our office  with weakness and shaking chills and his temperature in our office was  101.9.  He denied any dysuria, but he has had a slow urinary stream.  He  has had no cough.  He has had no change in bowel habits, nausea,  vomiting, or diarrhea, and he has not had any history of tick  bite  exposure.  He had been to our office earlier that day for fasting blood  work, which showed normal function of the liver and kidneys.  Blood  sugar of 93, total cholesterol 168, triglycerides 94, HDL 52, LDL 88,  and VLDL of 19, on Crestor 20 mg daily.   PHYSICAL EXAMINATION:  VITAL SIGNS:  Blood pressure was 140/60, pulse  was 90 and regular, and temperature was 101.9.  GENERAL:  He was uncomfortable having a shaking chill.  There was no  skin rash.  He complained of left-sided chest pain.  HEAD AND NECK:  Unremarkable.  LUNGS:  Clear.  HEART:  No gallop.  ABDOMEN:  Soft and nontender.  EXTREMITIES:  No phlebitis or edema.  Pedal pulses were good.   HOSPITAL COURSE:  The patient was admitted to telemetry.  Serial enzymes  were obtained.  The patient was cultured blood cultures x2 and urine  culture,  and then started empirically on antibiotics in the form of  Avelox.  Rickettsial antibodies were also obtained.  His EKG on  admission showed left bundle-branch block with secondary ST-T wave  changes, and a left bundle-branch block is old.  His initial cardiac  enzymes were not elevated, but a D-dimer was elevated, and we went ahead  and got a CT angiogram of the chest.  This was negative for pulmonary  emboli or dissection.  Cardiac enzymes came back subsequently borderline  positive with CK-MB of 0.9, 3.3, and 3.1 with troponins of 0.3, 0.42,  and 0.53 consistent with a small myocardial enzyme leak secondary to the  stress of his acute illness.  The patient did have a white count  elevation of 18,000 on the morning after admission.  One of two blood  cultures grew Gram-positive cocci, which grew out a group G strep.  Infectious Disease was asked to see the patient, and Dr. Orvan Falconer saw  the patient and continued the Avelox pending final blood culture results  since the patient was improving clinically.  On the afternoon of August  17, the patient developed acute dyspnea with  wheezes and was seen as an  emergency by Dr. Reyes Ivan on-call for Dr. Patty Sermons.  Dr. Reyes Ivan started  him on Xopenex nebulizer and gave him one dose of Solu-Medrol, and he  did improve.  The next day, we did ask Pulmonary to see him.  The blood  culture results came back as group G strep, and Infectious Disease  changed him from Avelox to Rocephin to narrow the spectrum.  Pulmonary  noted that his B-natriuretic peptide was 400, and felt that a lot of his  shortness of breath was multifactorial, but probably primarily cardiac  and secondary also to his bacteremia, and also related was his  deconditioning, and then did not find any inherent pulmonary disease.  His followup chest x-ray was unremarkable.  We checked his oxygen while  walking and his O2 saturations dropped to 69%, so he did qualify for  home oxygen.  His white count remained normal.  Two-dimensional  echocardiogram showed left ventricular ejection fraction of 60-65% with  mild paradoxical septal motion secondary to his left bundle and mild  mitral regurgitation and mild left atrial enlargement.   By April 18, 2009, the patient had improved to the point that he could  be discharged home.  Infectious Disease recommended an additional 10  days of oral amoxicillin 500 mg 3 times a day.  During the latter  portion of the hospital course, the patient did have some blotchy red  areas on his lower legs, which might indicate some low-grade skin  cellulitis.  The patient does swim in public swimming pools, and may  have contracted a skin infection in that regard.  B-natriuretic peptide  responded to oral Lasix.   The patient is being discharged improved on the following regimen.  Low-  sodium diet.  Oxygen at 2 L a minute.  He will be seen by Dr. Patty Sermons  back in the office on September 1 or September 2 for an office visit, B-  natriuretic peptide, CBC, and CMET.   DISCHARGE MEDICATIONS:  1. Tegretol 300 mg twice a day.  2.  Ranitidine 150 mg daily.  3. Aspirin 325 one daily.  4. Plavix 75 mg 1 daily.  5. Imdur 60 mg 1 daily.  6. Os-Cal with vitamin D 500 mg twice a day.  7. Toprol-XL 50 mg daily.  8. Crestor  20 mg 1 daily.  9. Finasteride 5 mg daily.  10.Mucinex 600 mg twice a day p.r.n.  11.Atrovent metered-dose inhaler 2 puffs 4 times a day.  12.Albuterol metered-dose inhaler 2 puffs every 6 hours p.r.n. for      acute wheezing.  13.MiraLax 17 g daily if needed for constipation.  14.Furosemide 40 mg 1 daily.  15.K-Dur 20 mEq twice a day.  16.Amoxicillin 500 mg 3 times a day for 10 days.  17.Temazepam 15 mg p.o. for sleep.  18.Nitrostat 0.4 mg p.r.n.  19.Stool softener 1 or 2 daily p.r.n.   CONDITION ON DISCHARGE:  Improved.   TIME SPENT IN PREPARATION OF DISCHARGE PAPERS:  More than 30 minutes.           ______________________________  Cassell Clement, M.D.     TB/MEDQ  D:  04/18/2009  T:  04/18/2009  Job:  [id number]   cc:   Cliffton Asters, M.D.  Pulmonary Critical Care

## 2011-01-12 NOTE — Cardiovascular Report (Signed)
[name], [name] NO.:  [id number]   MEDICAL RECORD NO.:  [id number]          PATIENT TYPE:  INP   LOCATION:  2905                         FACILITY:  [facility]   PHYSICIAN:  Shane Bathe, MD      DATE OF BIRTH:  02/11/1925   DATE OF PROCEDURE:  06/18/2007  DATE OF DISCHARGE:                            CARDIAC CATHETERIZATION   PROCEDURE:  Left heart catheterization, selective coronary angiography,  selective bypass angiography, LIMA angiography and left ventriculogram.   OPERATOR:  Shane Bathe, MD   INTERVENTIONALIST:  Arturo Morton. Riley Kill, MD, [facility]   INDICATIONS:  [name] is an 77 year old male with coronary artery  disease status post bypass with non-ST elevation MI.  Troponin elevated  at 2.4, MB elevated at 58.  He had ongoing pain despite nitroglycerin  and was taken emergently to the cardiac catheterization lab.   DESCRIPTION OF PROCEDURE:  The patient was consented, risks and benefits  explained.  He as prepped and draped in a sterile fashion, placed on the  catheterization table, 1% lidocaine was used to infiltrate the right  groin.  A 6 French sheath was placed in the right femoral artery.  Utilizing fluoroscopic guidance, a Judkins left #4 catheter was used to  selectively cannulate the left main artery.  Several views with hand  injection of Omnipaque were obtained.  This catheter was then exchanged  from a Judkins right #4 catheter which was used to selectively cannulate  the right coronary artery.  SVG to obtuse marginal graft and the free  RIMA to diagonal graft.  Several views with hand injection were used.  This catheter was exchanged for a LIMA catheter and wire was passed down  the left subclavian artery under fluoroscopic guidance.  The LIMA  catheter was selectively engaged into the LIMA graft.  Multiple views  were obtained with hand injection.  This catheter was exchanged for an  angled pigtail which was used to cross the aortic valve  into the left  ventricle.  Hemodynamics were obtained. Left ventriculogram using 30 mL  of dye was power injected in the RAO position.  Catheters were then  removed and findings were discussed with Dr. Shawnie Pons and  intervention was planned.   FINDINGS:  Native vessels:  1. Left main artery, distal 60% stenosis.  2. Left anterior descending artery, 100% proximal stenosis. Native      vessel continues after supply from the LIMA to LAD to wrap around      the apex.  There are 2 diagonal branches of small caliber.  There      is late collateral flow from the LAD territory into the obtuse      marginal territory supplied by the SVG graft.  3. Left circumflex flex artery, there is 80% proximal stenosis.  There      are 4 obtuse marginal branches.  Branches 2 and 3 are supplied by      the vein graft.  The first obtuse marginal is small in caliber and      diffusely diseased.  The 4th obtuse marginal also  is small in      caliber distally and has minor irregularities.  The circumflex then      continues in the AV groove.  There is a large atrial branch from      the mid-circumflex.  4. Right coronary artery, this is 100% proximally occluded with right-      to-right collaterals filling 2 acute marginal branches distally.      The distal right coronary artery also appears to be occluded.   GRAFTS:  1. Free RIMA graft to first diagonal, this graft is patent with 40%      proximal and distal stenosis. At the anastomosis site, there is      some tenting of the first diagonal artery and some minor narrowing.      This graft also fills via collaterals, the second diagonal branch      which is proximally occluded.  2. SVG to obtuse marginal 2 and 3, sequential graft, proximally      occluded with heavy thrombus burden.  3. SVG to PDA, this graft is widely patent.  There is 40% stenosis at      the anastomosis site of the PDA.  This graft also supplies a large      posterolateral branch.   4. LIMA to LAD, widely patent.  The LAD then continues to wrap around      the apex.  There are mild irregularities of the remainder of the      LAD artery.  This graft also fills the retrograde flow, the second      diagonal branch.   LEFT VENTRICULOGRAM:  Normal left ventricular ejection fraction of 60%  with no wall-motion abnormalities.  No mitral regurgitation noted.   HEMODYNAMICS:  Left ventricular systolic pressure was 123.  Left  ventricular end-diastolic pressure was 20 mmHg.  Aortic pressure was  123/65 mmHg.  There was no gradient detected.   IMPRESSION:  1. Severe native 3-vessel coronary artery disease.  2. Occluded saphenous vein graft to obtuse marginal 2 and 3 with heavy      thrombus burden.  3. Patent free right internal mammary artery to first diagonal graft      and patent saphenous vein graft to patent ductus arteriosus graft.  4. Normal left ventricular ejection fraction of 60% with no wall-      motion abnormalities.   RECOMMENDATIONS:  The film was discussed with Dr. Bonnee Quin who will  perform intervention on the saphenous vein graft to obtuse marginal 2  and 3.  Plavix load was administered as well as Angiomax.      Shane Bathe, MD  Electronically Signed     MCS/MEDQ  D:  06/18/2007  T:  06/19/2007  Job:  [id number]   cc:   Cassell Clement, M.D.  Arturo Morton. Riley Kill, MD, [facility]

## 2011-01-12 NOTE — H&P (Signed)
NAME:  KAIMEN, PEINE NO.:  [id number]   MEDICAL RECORD NO.:  [id number]          PATIENT TYPE:  INP   LOCATION:  2921                         FACILITY:  [facility]   PHYSICIAN:  Cassell Clement, M.D. DATE OF BIRTH:  1925-05-10   DATE OF ADMISSION:  10/21/2008  DATE OF DISCHARGE:                              HISTORY & PHYSICAL   CHIEF COMPLAINT:  Chest pain.   HISTORY:  This is an 77 year old married Caucasian gentleman who  presents with 2-3 days history of stuttering chest pain.  He does have  known coronary artery disease.  On Saturday, October 19, 2008, he was  feeling well and did some yard work including clipping some bushes with  an Estate agent.  That night, he felt well but the next morning had  chest pain getting ready to go to church.  He was able to go to church  nonetheless and felt okay the rest of the day, but last night he again  had chest pain in the substernal area with radiation down the left arm.  There was no vomiting but there was mild nausea.  There was no  diaphoresis.  He had some old nitroglycerin in hand which he took  without much improvement.  He also took aspirin and Zantac without much  improvement.  Called my home this morning at about 7 with the history  and was advised to come to the emergency room.  He has also taken his  Imdur and Toprol this morning with some moderation of pain.  The patient  has a history of having had coronary artery bypass graft surgery in 1986  and a redo CABG in 1993.  His last cardiac catheterization was in  October 2008 by Dr. Anne Fu and Dr. Riley Kill did intervention with finding  of a large clot burden in a saphenous vein graft to the obtuse marginal  1 and obtuse marginal 2 and the clot was removed and he also had a Multi-  Link Vision stent to the saphenous vein graft.  The patient has done  well since that time.  He has been on Plavix for a year.  He gets his  medicines through the [name].  After 1  year, the [name] would no longer allowed  him to stay on the Plavix.  They removed the Plavix since October 2009.   FAMILY HISTORY:  Positive for heart problems.  He has a brother who has  also had coronary artery disease.   SOCIAL HISTORY:  He is married.  He is active in his local church.  He  does not use alcohol or tobacco.   ALLERGIES:  He denies any allergies but he is intolerant to ACE  inhibitors which cause excessive hypotension and orthostatic  hypotension.   CURRENT MEDICATIONS:  1. Aspirin 325 daily.  2. Generic Imdur 60 mg daily.  3. Generic Zocor 80 mg daily.  4. OsCal 500 with vitamin D twice a day.  5. Toprol-XL 25 mg daily.  6. Nitrostat sublingually p.r.n.  7. Restoril 15 mg at bedtime p.r.n.  8. Stool softener p.r.n.  9. MiraLax  p.r.n.  10.Tegretol 300 mg b.i.d. for neuropathy.  11.Ranitidine 150 mg daily p.r.n.   REVIEW OF SYSTEMS:  The patient has had normal bowel movements.  He has  had no melena.  He does have a history of hyperacidity and symptoms of  GERD.  Genitourinary history reveals that he had prostate cancer  approximately 17 years ago treated with radiation therapy.  He sees Dr.  Aldean Ast.  He still has a lot of problem with recurrent prostatitis and  he has nocturia 1-2 times a night.  Respiratory reveals no recent chest  x-ray.  He has had a recent cough with congestion.  Neuro history  reveals that he has been on Tegretol for neuropathy.  All other systems  negative in detail.   PHYSICAL EXAMINATION:  VITAL SIGNS:  Blood pressure 137/71, pulse 60 and  normal sinus rhythm, respirations normal, and O2 saturation 94% on room  air.  GENERAL:  He is a well-developed, well-nourished white male in no acute  distress.  Color is good.  HEENT:  Negative.  The pupils are equal and reactive.  Sclerae  nonicteric.  Mouth and pharynx are normal.  NECK:  Jugular venous pressure normal.  Carotids normal.  The thyroid is  not enlarged.  There is no  lymphadenopathy.  LUNGS:  Clear to percussion and auscultation.  HEART:  A quiet precordium without murmur, gallop, rub, or click.  ABDOMEN:  Soft.  Bowel sounds are active.  There is no  hepatosplenomegaly or mass.  EXTREMITIES:  Good peripheral pulses.  There is no phlebitis.  No edema.  NEUROLOGIC:  Exam is physiologic.  SKIN:  No rash.   LABORATORY DATA:  Laboratory studies are pending.  Chest x-ray is  pending at the time of this dictation.  EKG shows normal sinus rhythm on  the old left bundle-branch block and no significant change since prior  tracing.   IMPRESSION:  1. Chest pain consistent with unstable angina.  2. Status post coronary artery bypass graft in 1986, redo coronary      artery bypass graft in 1993 with last catheterization in October      2008, at which time an occluded saphenous vein graft was opened up      and stented with a Multi-Link Vision stent.  3. Hyperlipidemia.  4. Gastroesophageal reflux disease.  5. Remote history of prostate cancer.   DISPOSITION:  Admit to telemetry.  Treat with oxygen, statins,  metoprolol, nitrates, IV heparin, and aspirin.  We will get serial  cardiac enzymes and consider possible cardiac catheterization likely  this admission.           ______________________________  Cassell Clement, M.D.     TB/MEDQ  D:  10/21/2008  T:  10/21/2008  Job:  [id number]

## 2011-01-12 NOTE — Discharge Summary (Signed)
NAMEMarland Kitchen  MONTA, POLICE NO.:  [id number]   MEDICAL RECORD NO.:  [id number]          PATIENT TYPE:  INP   LOCATION:  2028                         FACILITY:  [facility]   PHYSICIAN:  Cassell Clement, M.D. DATE OF BIRTH:  [birth year]   DATE OF ADMISSION:  10/21/2008  DATE OF DISCHARGE:  10/25/2008                               DISCHARGE SUMMARY   FINAL DIAGNOSES:  1. Non-Q-wave acute myocardial infarction.  2. Status post coronary artery bypass graft in 1986 and redo coronary      artery bypass graft in 1993.  3. Hyperlipidemia.  4. Gastroesophageal reflux disease.  5. Remote history of prostate cancer.  6. Urinary tract infection with prostatitis.   OPERATIONS PERFORMED:  Cardiac catheterization by Dr. Peter Swaziland on  October 22, 2008, with the finding of an occluded saphenous vein graft  to the second and third marginal branches, a patent left internal  mammary artery graft to the left anterior descending, a patent right  internal mammary artery graft to the right coronary artery as a free  graft, and a patent vein graft to the first diagonal with 2 sequential  high-grade stenoses.  The patient underwent successful intracoronary  stenting of the 2 sequential high-grade stenoses and the patent vein  graft to the first diagonal using a drug-eluting stent.   HISTORY:  This 77 year old gentleman presents with a 2- to 3-day history  of stuttering chest pain.  He does have known coronary artery disease as  noted above.  He came to the emergency room and was admitted with a  suspicion of acute coronary syndrome.  His electrocardiogram on  admission showed an old left bundle-branch block.  Of note is the fact  that the patient had been on Plavix until October 2009 when he was no  longer able to obtain Plavix from his [facility] because he had  been on it for 12 months.  The patient was admitted to telemetry.  He  was placed on IV heparin, IV nitroglycerin, and  aspirin.  He continued  to have pain and was also given IV Integrilin.  On the day after  admission, the patient was taken to the cath lab by Dr. Peter Swaziland,  and underwent successful stenting of saphenous vein graft stenoses to  the diagonal x2 with drug-eluting stents.  Post-cath, the patient did  well.  He was treated with IV Integrilin for 18 hours.  He was begun on  aspirin and Plavix.  The patient did well and was gradually ambulated.  He did have some chest wall pain, which delayed his discharge for a day.  He also complained of urinary frequency and urinalysis and culture were  obtained.   By October 25, 2008, the patient was able to be discharged home.   ACCESSORY LABORATORY STUDIES:  CBC at discharge:  Hemoglobin of 12.5,  white count 11,800, and platelets 165000.  Sodium 131, potassium 3.9,  BUN 14, creatinine 0.98, blood sugar 103, AST 71, and bilirubin 0.9.  Cardiac enzymes showed a peak troponin of 23, peak CK-MB of 91, and  total peak CK  of 790.  At discharge, troponin was 3.3 and CK-MB was  15.8.  His cholesterol was 162, LDL 98, HDL 51, and triglycerides 65,  and we are increasing his Zocor from 40-80 mg to get his LDL down  further.  Glycosylated hemoglobin was 5.7.  B-natriuretic peptide was  97.  Fibrin derivatives were 0.67.  Sed rate was 20.   The patient is being discharged improved on a low-sodium heart-healthy  diet.  He is to avoid heavy lifting.  His groin is fine at the time of  discharge.   He will be discharged on:  1. Simvastatin 80 mg daily.  2. Tegretol 300 mg twice a day.  3. Ranitidine 150 mg daily.  4. Aspirin 325 daily.  5. Plavix 75 daily.  6. Imdur 60 mg daily.  7. Mucinex 600 mg twice a day p.r.n.  8. Os-Cal 500 with vitamin D twice a day.  9. Toprol-XL 50 mg one daily.  10.Restoril 15 mg at bedtime p.r.n. for sleep.  11.Nitrostat 1/150 p.r.n.  12.He will also be given a 5-day course of Cipro 500 mg b.i.d.   CONDITION ON DISCHARGE:   Improved.   TIME SPENT IN PREPARATION DISCHARGE:  30 minutes.           ______________________________  Cassell Clement, M.D.     TB/MEDQ  D:  10/25/2008  T:  10/25/2008  Job:  [id number]   cc:   Peter M. Swaziland, M.D.  Courtney Paris, M.D.  Dr. Gilmer Mor

## 2011-01-12 NOTE — Cardiovascular Report (Signed)
NAMEMarland Kitchen  JAEVIN, MEDEARIS NO.:  [id number]   MEDICAL RECORD NO.:  [id number]          PATIENT TYPE:  INP   LOCATION:  2011                         FACILITY:  [facility]   PHYSICIAN:  Arturo Morton. Riley Kill, MD, FACCDATE OF BIRTH:  [birth year]   DATE OF PROCEDURE:  06/18/2007  DATE OF DISCHARGE:                            CARDIAC CATHETERIZATION   INDICATIONS:  Mr. Cothron is an 77 year old gentleman well-known to [place].  He has had prior revascularization surgery.  He had a coronary bypass x5  in March 1986, which at time he had a left internal mammary to the LAD,  saphenous vein graft to the diagonal, sequential saphenous vein graft to  the OM-1 and OM-2 and saphenous vein graft to the PDA.  He had redo CABG  in 1993, at which time he had free RIMA placed to the right coronary  artery.  He presented with some ongoing chest pain which was not  relieved and enzymes turned out to be positive.  As a result, diagnostic  catheterization was recommended.  He was brought to the catheterization  lab emergently.  The patient underwent diagnostic cardiac  catheterization by Dr. Anne Fu with graft to the diagonal, free RIMA to  the right and internal mammary were all patent.  His overall LV function  was preserved.  The vein graft to the OM-1 and OM-2 was occluded  proximally.  As a result, urgent intervention was recommended.   PROCEDURE:  Percutaneous stenting of the saphenous vein graft to the  obtuse marginal utilizing distal protection with the EV-3 spider device.   DESCRIPTION OF PROCEDURE:  A JR-4 guiding catheter with side holes was  utilized.  Bivalirudin was given according to protocol.  ACT was checked  and found to be appropriate.  Clopidogrel was administered in the  appropriate fashion.  We initially crossed with a 180 cm, Prowater wire.  Following this, predilatation was done with a 2 x 15-mm, Maverick  balloon to 6 atmospheres.  Following this, we successfully deployed an  EV-3 spider, FX, 4-mm, distal protection catheter.  We then performed  fetch aspiration using a fetch catheter and significant clot was removed  from the hazy thrombotic lesion.  Beyond this, the vessel was then  dilated with a 2.5-mm balloon and then definitively stented using a  Multilink, 30 x 28, nondrug-eluting stent platform.  This was taken to  16 atmospheres.  As per our protocol, this was not post dilated in an  old atheromatous saphenous vein graft.  There was filling of the spider  device.  We subsequently removed the spider device without difficulty.  There was marked improvement in the appearance of the artery with  excellent TIMI-3 flow.  Intracoronary Verapamil was administered.   ANGIOGRAPHIC DATA:  The saphenous vein graft was occluded proximally.  Following reperfusion with the wire and small balloon, there was  evidence of extensive thrombus.  Utilizing distal protection, we were  able to aspirate a significant amount of the thrombus.  Following this,  dilatation was redone with a balloon and subsequently with a stent.  Some material was retrieved with the  spider catheter.  At the completion  of the procedure there was TIMI-3 flow into the distal vessel without  evidence of no reflow.  The lesion site went from 100% to 0% residual  luminal narrowing.  There is about a 40% area of narrowing just distal  to the stent site and this was followed by a valve.  The vessel then  opens up with a beautiful sequential graft into an OM-1 and OM-2 and has  some modest distal disease.   CONCLUSION:  Successful percutaneous stenting emergently of the  saphenous vein graft to the obtuse marginal branches.   DISPOSITION:  The patient will need aspirin and Plavix.  The minimum  period for Plavix will be 1 month, but likely, the patient would benefit  from continued use given the old age of his grafts.  We will defer this  to Dr. Patty Sermons.      Arturo Morton. Riley Kill, MD, [facility]   Electronically Signed     TDS/MEDQ  D:  06/28/2007  T:  06/29/2007  Job:  [id number]

## 2011-01-12 NOTE — Cardiovascular Report (Signed)
NAME:  [name], [name] NO.:  [id number]   MEDICAL RECORD NO.:  [id number]          PATIENT TYPE:  INP   LOCATION:  2921                         FACILITY:  [facility]   PHYSICIAN:  [name], M.D.  DATE OF BIRTH:  Jun 03, 1925   DATE OF PROCEDURE:  10/22/2008  DATE OF DISCHARGE:                            CARDIAC CATHETERIZATION   INDICATION FOR PROCEDURE:  [name] is an 77 year old white male who  has known history of coronary artery disease.  He is status post initial  bypass surgery in 1986 and subsequent redo surgery in 1993.  He presents  with a non-Q-wave myocardial infarction.  He had had previous myocardial  infarction on October 2008 and underwent stenting of the vein graft to  the marginal branches at that time with a bare-metal stent.   PROCEDURE:  Left heart catheterization, coronary and left ventricular  angiography, saphenous vein graft angiography x3, LIMA graft angiography  and intracoronary stenting of the saphenous vein graft to the diagonal  branch.   ACCESS:  Via the right femoral artery using the standard Seldinger  technique.   EQUIPMENT:  6-French 4 cm right and left Judkins catheter, 6-French  pigtail catheter, 6-French Multipurpose catheter, 6-French arterial  sheath, 6-French left Amplatz 1 guide, a 2.0 x 12-mm Apex balloon, a 2.5  x 15-mm Xience stent and a Prowater wire.   MEDICATIONS:  Local anesthesia 1% Xylocaine, Versed 2 mg IV, Integrilin  was continued to infuse during the procedure.  He received a heparin  bolus of 3800 units with subsequent ACT of 331 seconds.   CONTRAST:  225 mL of Omnipaque.   HEMODYNAMIC DATA:  Aortic pressure was 124/61 with a mean of 88 mmHg,  left ventricular pressure was 119 with an EDP of 11 mmHg.   ANGIOGRAPHIC DATA:  Left coronary artery arises and distributes  normally.  The left main coronary has a 50% stenosis proximally.   Left anterior descending artery is occluded proximally.   There  is a very small intermediate branch which has a 95% stenosis  proximally.   The left circumflex coronary artery gives rise to very small first  marginal branch and then terminates in two small marginal branches.  The  second and third marginal branches were occluded.  There is an 80%  stenosis in the proximal circumflex after the takeoff of the first small  marginal branch.   The right coronary artery is occluded proximally.  It gives bridging  collaterals to the mid right coronary artery and right ventricular  marginal branch.   The saphenous vein graft to the diagonal branch supplies the first  diagonal.  There is retrograde flow to the mid-LAD and there is  collateral flow to a second diagonal branch.  The vein graft to the  diagonal has two sequential high-grade stenoses.  There is an 80%  stenosis in the proximal graft at the aortic end.  There is a 90% focal  stenosis in the distal vein graft.   The saphenous vein graft to the first and second obtuse marginal vessels  is occluded proximally within the stented  segment.  No collaterals were  seen in any view to the marginal branches.   The free RIMA graft to the distal right coronary artery is widely  patent.   The LIMA graft to the LAD is widely patent.   Left ventricular angiography was performed in the RAO view.  This  demonstrates normal left ventricular size and contractility with normal  systolic function.  Ejection fraction is estimated at 60%.  No regional  wall motion abnormalities were seen.   It was felt at this point that the patient's culprit lesion for his  admission was occlusion of the vein graft to the obtuse marginal  vessels.  I do not feel that it was beneficial at this point to open  this graft since it was a 75 year old graft and already had previously  been stented at one time.  There were no collaterals to indicate any  residual healthy myocardium in this region.  We next addressed the vein  graft  to the diagonal which was suitable for intervention.  Using a left  Amplatz guide, we were able to cross these lesions easily with a  Prowater wire.  We predilated the lesion using a 2.0 x 12-mm Apex  balloon up to 6 atmospheres each.  We then stented the more distal  lesion in the vein graft using a 2.5 x 12-mm Xience stent deploying this  at 8 atmospheres and then 12 atmospheres to the stent balloon.  This  yielded excellent angiographic result with 0% residual stenosis and TIMI  grade 3 flow.  We next stented the more proximal or aortic lesion using  a 2.5 x-15 mm Xience stent also deploying this at 8 and then 12  atmospheres.  This again gave an excellent angiographic result with 0%  residual stenosis and TIMI grade 3 flow.  In keeping with protocol for  vein grafts, we did not postdilate to high inflation pressures.   FINAL INTERPRETATION:  1. Severe three-vessel obstructive atherosclerotic coronary artery      disease.  2. Patent LIMA graft to LAD.  3. Patent RIMA graft to the right coronary artery as a free graft.  4. Occluded saphenous vein graft to the second and third marginal      branches.  5. Patent vein graft to the first diagonal with two sequential high-      grade stenoses.  6. Good left ventricular systolic function.  7. Successful intracoronary stenting of the two lesions in the vein      graft to the diagonal branch using drug-eluting stents.           ______________________________  [name], M.D.     PMJ/MEDQ  D:  10/22/2008  T:  10/23/2008  Job:  [id number]

## 2011-01-12 NOTE — Consult Note (Signed)
NAME:  [name], [name] NO.:  [id number]   MEDICAL RECORD NO.:  [id number]          PATIENT TYPE:  INP   LOCATION:  2023                         FACILITY:  [facility]   PHYSICIAN:  Felipa Evener, MD  DATE OF BIRTH:  03/11/1925   DATE OF CONSULTATION:  DATE OF DISCHARGE:                                 CONSULTATION   IDENTIFICATION:  The patient is 77 year old male with past medical  history significant for cardiac disease who presented to Dr. Yevonne Pax  office with a chief complaint of chest pain and he was unable to locate  his nitroglycerin pills, but the patient was found to be febrile in the  office and he was sent to the [facility] for inpatient  admission for his further evaluation and treatment.  While in the  hospital, the patient became afebrile; however, his cultures did grow a  group B strep for which an Infectious Disease Service is following.  The  patient continues to have wheezing and persistent shortness of breath in  need of oxygen for which pulmonary critical care was consulted.  Upon  evaluating the patient, the patient denies any recurrent fever, chills,  nausea, vomiting, or abdominal pain, does report intermittent chest  pain.  Denies any cough or any sputum production.  Does report some  orthopnea and paroxysmal nocturnal dyspnea.  Now, the shortness of  breath is mostly on exertion.   MEDICATIONS:  Tegretol 30 mg twice a day for peripheral neuropathy,  aspirin 325 mg daily, Plavix 75 mg daily, Imdur 60 mg daily, Os-Cal,  Vitamin D, Toprol-XL 50 mg once a day, Restoril 50 mg nightly,  ranitidine 150 mg daily on a p.r.n. basis, finasteride 5 mg daily,  Crestor 20 mg daily.   ALLERGIES:  No known drug allergies.   FAMILY HISTORY:  Noncontributory at this stage for the patient's life.   SOCIAL HISTORY:  She is a lifelong nonsmoker, nondrinker.  No history of  drug use.  He is a Charity fundraiser, used to work in the lab at [employer].   REVIEW OF SYSTEMS:  Twelve-point review of system was performed that was  negative other than mentioned in the HPI.   PHYSICAL EXAMINATION:  GENERAL:  This is an elderly-appearing male  sitting comfortably in exam bed, in no acute distress.  VITAL SIGNS:  He is afebrile.  Temperature 98.4, heart rate is 79,  respiratory rate is 20, blood pressure 135/70, and saturation 95% on  room air.  HEENT:  Normocephalic and atraumatic.  Pupils equal, round, and reactive  to light.  Extraocular movements are intact.  Oral and nasal mucosa  within normal limits.  NECK:  No thyromegaly.  No lymphadenopathy.  Normal jugular reflux  appreciated.  HEART:  Regular rate and rhythm.  Normal S1 and S2.  No murmurs, rubs,  or gallops appreciated.  LUNGS:  Diffuse end expiratory wheezes that are not localizable, minimal  changes with fremitus.  ABDOMEN:  Soft, nontender, and nondistended.  Positive bowel sounds.  EXTREMITIES:  A 1+ edema and no tenderness.   LABORATORY STUDIES:  Reviewed, significant blood culture that showed the  group B strep.  His urinalysis was negative.  [facility] spotted  fever was negative.  His CRP was 0.70.  ESR was 4.  Most recent lab had  a BNP 409.  Troponin is 0.48.  CMP, sodium 136, potassium 3.7, chloride  is 105, CO2 is 26, glucose is 114, BUN is 12, creatinine is 1.16, AST  43, ALT 21, total protein is 6.2, calcium is 8.6, creatinine kinase  total was 305, MB is 2.8, index of 0.9 and ABG was reviewed that showed  a pH 7.46, CO2 of 33, PO2 of 63, and a bicarb of 23 with the patient on  4 L nasal cannula.  Chest x-ray reviewed by myself showed pulmonary  vascular congestion, known cardiomegaly, had a questionable evidence of  right lower lobe infiltrate.  Chest CT were reviewed showed no evidence  of PE.  No eczematous changes, essentially some minimal bronchiectasis,  and some dependent atelectasis.  No other abnormalities.   ASSESSMENT AND  PLAN:  The patient is an 77 year old male with past  medical history significant for cardiac disease presenting to the  pulmonary service with fever, chest pain, wheezing, and shortness of  breath.  His BNP and troponins are positive indicating cardiac event.  The patient has been moved back to cardiology service.  The shortness of  breath is multifactorial, primarily cardiac, secondary reason will be  septicemia as evident by the blood culture, fever, and elevated white  counts and deconditioning is the third consideration.  Therefore, at  this point, we will diurese the patient with the Lasix 40 mg IV x1.  We  will replace potassium preoperatively.  We will change the nebs to  Xopenex q.6 h. scheduled q.4 h. p.r.n.  We will titrate O2 for  saturation of 88-92.  After diuresis if the patient remains hypoxic, we  will need to improve the saturation to assist the hypoxia.      Felipa Evener, MD  Electronically Signed     WJY/MEDQ  D:  04/16/2009  T:  04/17/2009  Job:  [id number]

## 2011-01-12 NOTE — H&P (Signed)
NAME:  ITALO, BANTON NO.:  [id number]   MEDICAL RECORD NO.:  [id number]          PATIENT TYPE:  EMS   LOCATION:  MAJO                         FACILITY:  [facility]   PHYSICIAN:  Jake Bathe, MD      DATE OF BIRTH:  03/16/1925   DATE OF ADMISSION:  06/18/2007  DATE OF DISCHARGE:                              HISTORY & PHYSICAL   PRIMARY CARDIOLOGIST:  Cassell Clement, M.D.   CHIEF COMPLAINT:  Chest pain.   HISTORY OF PRESENT ILLNESS:  Mr. Heppler is a pleasant 77 year old male  with coronary artery disease status post coronary artery bypass graft x2  with hyperlipidemia who reports to [facility] ED with chest pain.  Earlier today at church at 9 a.m. while walking, he developed left-sided  chest pain with radiation down his left arm which was intense, rated as  an 8/10.  Usually when he has his stable angina, he is able to sit down  and it goes away after 5 or 10 minutes.  After rest here, the pain  persisted, and he took a nitroglycerin from his pill bottle in his  pocket with very subtle relief.  After 2 nitroglycerins, he left church  and gave me a call.  I asked him to come to the emergency department.  He had subtle shortness of breath and diaphoresis with this discomfort  but denies any palpitations or presyncope, syncope.  He felt as though  he had to slow up and was with increased fatigue.  Currently in the  emergency department, pain was from an 8-9/10 down to a 4-5/10 and  improving.   Here in the emergency department, he received aspirin once again and was  started on nitroglycerin drip.   PAST MEDICAL HISTORY:  1. Coronary artery disease.  Coronary artery bypass graft x2, first      one in 1986, second in 1993.  LIMA to LAD, free RIMA to RCA.  SVG      to diagonal, sequential SVG to obtuse marginal 1 and 2.  Last      catheterization in 2002 by Dr. Peter Swaziland showed severe three-      vessel coronary artery disease with all four grafts patent and      normal left ventricular function.  2. Hyperlipidemia - on Zocor.  3. Osteoarthritis - calcaneus fracture.  4. History of prostate cancer status post TURP.   PAST SURGICAL HISTORY:  1. CABG (see above for details).  2. Hernia repair.  3. Appendectomy.   ALLERGIES:  NO KNOWN DRUG ALLERGIES.   MEDICATIONS:  1. Aspirin 325 mg once a day.  2. Imdur 60 mg once a day.  3. Toprol-XL 25 mg once a day.  4. Omeprazole 20 mg once a day.  5. Zocor 40 mg once a day.  6. Calcium/vitamin D/glucosamine.  7. Temazepam 15 mg q.h.s.  8. Risedronate 35 mg once a week for osteoporosis.  9. Stopped lisinopril recently secondary to low blood pressure.   SOCIAL HISTORY:  Denies any former or current tobacco use, alcohol use,  or illicit drug use.  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  He has known Dr. Patty Sermons for several years.  His wife is Georgeann Oppenheim  and is here with him.  They have been married for 52 years.   REVIEW OF SYSTEMS:  No fevers, chills, cough, headache.  Has noted  constipation and chest pain, as noted above.  Unless stated above, all  other 12 review of systems negative.   PHYSICAL EXAMINATION:  VITAL SIGNS:  On arrival, blood pressure 154/78,  pulse 58, respirations 20, saturating 99% on room air, afebrile.  GENERAL:  Alert and oriented x3.  No acute distress.  Pleasant.  Laying  in bed.  HEENT:  Eyes with well-perfused conjunctivae.  EOMI.  No scleral  icterus.  Moist mucous membranes.  NECK:  Supple.  No thyromegaly.  No carotid bruits heard.  CARDIOVASCULAR:  Bradycardic, regular rhythm, with soft systolic murmur  heard at apex.  Quite distant heart sounds.  Laterally displaced PMI.  No JVD.  LUNGS:  Clear to auscultation bilaterally.  Normal respiratory effort.  ABDOMEN:  Soft, nontender.  Normoactive bowel sounds.  No bruits.  EXTREMITIES:  No clubbing, cyanosis, or edema.  Support stockings in  place.  Difficult to palpate distal pulses.  NEUROLOGIC:   Nonfocal.  No tremors.  SKIN:  Warm, dry, and intact.   DATA:  ECG demonstrates sinus bradycardia with left bundle branch block  and right axis deviation.  I doubt this is true right axis deviation,  may be limb lead reversal.  When compared to prior ECG from August of  2005, there is no significant change, except for the right axis  deviation in lead I.  Chest x-ray noted mild stable cardiomegaly without  any other active disease.   Hgb 14.  TSH pending.  First set of cardiac biomarkers normal at 12:41  p.m.  Sodium 137, potassium 4.5, BUN 15, creatinine 1.0, glucose 117.  Prior cardiac catheterization, as described above.   ASSESSMENT AND PLAN:  77 year old male with coronary artery disease  status post bypass with hyperlipidemia and osteoporosis with current  chest pain, possible unstable angina.  1. Unstable angina.  Has received aspirin, beta-blocker, oxygen as      needed, nitroglycerin drip which I will increase to help alleviate      chest pain, Zocor.  No angiotensin-converting enzyme inhibitor      secondary to recent hypotension.  Pain has improved with      nitroglycerin.  Will continue Imdur in the morning.  I will make      nothing by mouth past midnight in case further procedure, that is      cardiac catheterization or nuclear stress test, is requested.  Will      continue to cycle cardiac enzymes.  Request a telemetry bed.  Note      that left bundle branch block is old.  Once again, prior      catheterization in 2002 demonstrated all 4 grafts patent.  2. Hyperlipidemia.  Zocor.  3. Osteoporosis.  Risedronate weekly.  4. Gastroesophageal reflux disease.  Prilosec.      Jake Bathe, MD  Electronically Signed     MCS/MEDQ  D:  06/18/2007  T:  06/18/2007  Job:  [id number]   cc:   Cassell Clement, M.D.

## 2011-01-12 NOTE — H&P (Signed)
NAMEMarland Kitchen  STEEL, KERNEY NO.:  [id number]   MEDICAL RECORD NO.:  [id number]          PATIENT TYPE:  INP   LOCATION:  2023                         FACILITY:  [facility]   PHYSICIAN:  Cassell Clement, M.D. DATE OF BIRTH:  1925/06/28   DATE OF ADMISSION:  04/14/2009  DATE OF DISCHARGE:                              HISTORY & PHYSICAL   CHIEF COMPLAINT:  Chest pain.   HISTORY OF PRESENT ILLNESS:  This is an 77 year old married Caucasian  gentleman who was seen in my office at noontime as an urgent walk-in.  He came to the office because of sudden onset of left-sided chest pain  radiating to the center of his chest and into the left upper back.  The  pain was sudden in onset, but grew gradually worse.  He did not take any  nitroglycerin at home because he could not find his nitroglycerin  bottle.  He has not had any cough or hemoptysis or sputum production.  He checked his temperature at home and it was low, but we rechecked it  in our office and it was elevated at 101.9.  When the patient arrived in  our office, he was having shaking chills.  He denies any dysuria but has  had a slow urinary stream, and he has had a past history of prostate  cancer treated with radiation and he has also had 2 prior transurethral  resections of the prostate.  He has also had a past history of  epididymitis.  Dr. Aldean Ast is his urologist.  The patient has not had  any change in bowel habits, nausea, or vomiting.  He has had no  diarrhea.  There is no history of any tick bite exposure.   His past cardiac history reveals that he had his first coronary artery  bypass graft surgery in 1986, and he had a redo coronary artery bypass  graft surgery in 1993.  His last Cardiolite stress test was in February  2008 and showed no reversible ischemia.  However, later that same year,  he had to have stenting of the vein graft and then in February 2010, he  had recurrent angina and had stenting of a  saphenous vein graft to his  diagonal by Dr. Swaziland using a drug-eluting stent.  Since then, he has  had no awareness of any exertional chest pain.  The pain today occurred  at rest while he was working at his computer in the middle of the  morning.  He had been to our office earlier this morning to get fasting  blood work and that fasting blood work came out okay including normal  liver function, normal kidney function, normal blood sugar of 93, and a  total cholesterol of 168, triglycerides of 94, HDL of 62, LDL of 88, and  a VLDL of 19.   PRESENT MEDICATIONS:  1. Tegretol 300 mg twice a day for peripheral neuropathy.  2. Aspirin 325 mg daily.  3. Plavix 75 mg daily.  4. Imdur 60 mg daily.  5. Os-Cal with vitamin D one twice a day.  6. Toprol-XL 50 mg  one daily.  7. Restoril 15 mg nightly.  8. Ranitidine 150 mg daily p.r.n.  9. Finasteride 5 mg daily.  10.Crestor 20 mg daily.   FAMILY HISTORY:  His mother died of heart attack.  Father died of  Addison disease.  He has one sister who died of a heart attack.   REVIEW OF SYSTEMS:  Negative except as noted in the present illness.  All other systems negative in detail.   ALLERGIES:  The patient has no known drug allergies.   PHYSICAL EXAMINATION:  VITAL SIGNS:  Blood pressure is 140/60, pulse is  90 and regular, temperature is 101.9.  GENERAL:  This is a middle-aged gentleman who is acutely uncomfortable  having a shaking chill and skin is moist.  He is not nauseated.  Complains of left-sided chest pain.  SKIN:  No rash.  HEAD AND NECK:  Pupils were equal and reactive.  Extraocular movements  were full.  Fundi showed no hemorrhages or exudates.  Ears are normal.  Mouth and pharynx normal.  Tongue normal.  Carotids normal.  No bruits.  CHEST:  Clear.  The thyroid is not enlarged.  There is no  lymphadenopathy.  HEART:  Quiet precordium.  There is no murmur, gallop, or pericardial  rub.  ABDOMEN:  Soft and nontender.  There is  no hepatosplenomegaly or mass.  EXTREMITIES:  No phlebitis or edema.  Pedal pulses are good.  NEUROLOGIC:  Physiologic.  Integument is negative for rash.   IMPRESSION:  Chest pain, uncertain etiology.  I suspect that this is  noncardiac versus cardiac.  The pain is different from his usual angina  pectoris.  We are going to consider other etiologies such as pulmonary  embolus and/or dissection.  The presence of the shaking chills is also  somewhat atypical.  The etiology of the fever is not presently known.  We will rule out urinary tract infection and/or consider pulmonary  source.   DISPOSITION:  The patient is being admitted urgently to telemetry.  Serial cardiac enzymes will be obtained.  We will culture blood and  urine and start empiric antibiotics in the form of Avelox.  We will get  rickettsial antibodies even though there is no history of tick bite.  We  will continue his other home medications at this point.           ______________________________  Cassell Clement, M.D.     TB/MEDQ  D:  04/14/2009  T:  04/15/2009  Job:  [id number]

## 2011-01-15 NOTE — H&P (Signed)
[place]  Patient:    [name], [name] Visit Number: [id number] MRN: [id number]          Service Type: Attending:  Peter M. Swaziland, M.D. Dictated by:   Peter M. [place]azil[place], M.D.   CC:         Clovis Pu. Patty Sermons, M.D.  Terald Sleeper, M.D.   History and Physical  HISTORY OF PRESENT ILLNESS:  Mr. Whitham is a 77 year old white male who presents with symptoms of increasing chest pain.  Patient has known history of coronary artery disease, is status post coronary artery bypass surgery in 1986.  He had redo coronary artery bypass surgery in 1993 with placement at that time of a free right internal mammary artery graft to the right coronary artery.  His last cardiac evaluation in 1997 with cardiac catheterization showed that all these grafts were patent.  He did have some moderate disease in the posterolateral branch of the distal right coronary artery.  He has been treated medically since then.  His last stress test was an adenosine Cardiolite study in January 2000 which was normal.  The patient now presents with a three-week history of increasing chest pain.  His pain is described in the left lower anterior chest, radiating up the side and into his left shoulder and arm, as well as his left neck.  Sometimes he has had associated jaw pain and a couple of occasions he has had some mild shortness of breath. He initially felt that this may have been related to Fosamax - which he was taking for osteoporosis.  He tried antacids without relief.  He stopped his Fosamax two weeks ago but still has had no relief and feels that his pain is worsening.  He did try two sublingual nitroglycerin last night and he did get relief.  He also reports that he had severe shortness of breath when he was hauling leaves two weeks ago.  Because of his refractory symptoms, patient is now admitted for cardiac catheterization.  PAST MEDICAL HISTORY:  Significant for ASCAD  status post CABG in 1986 with revision in 1993.  Patient has a LIMA graft to the LAD, free RIMA graft to the right coronary artery,  saphenous vein graft to the diagonal branch, and a sequential saphenous vein graft to the first and second obtuse marginal branches.  The patient has a history of prostate CA and is status post radiation therapy for that.  He has a history of diverticulosis, history of appendectomy.  He has a chronic left bundle-branch block.  History of hypercholesterolemia.  He has had previous arthroscopic surgery on his knee. He has history of osteoporosis.  He is status post fusion of his left heel for fracture.  He has chronic low back pain.  ALLERGIES:  No known allergies.  CURRENT MEDICATIONS: 1. Cardizem CD 180 mg per day. 2. Multivitamin daily. 3. Aspirin 325 mg per day. 4. Imdur 60 mg per day. 5. Restoril 15 mg q.h.s. 6. Zocor 40 mg per day.  The patient was on Fosamax until two weeks ago.  He is also on glucosamine chondroitin and B vitamins.  SOCIAL HISTORY:  Patient is married.  Denies history of tobacco or alcohol use.  He has two children.  FAMILY HISTORY:  Father died at age 13 with Addisons disease.  Mother died at age 58 with heart disease.  One brother died with lung cancer and one brother with prostate cancer.  One brother has had open heart surgery and  two sisters have also had heart disease.  REVIEW OF SYSTEMS:  Is as noted in HPI.  Otherwise, review of systems is negative.  PHYSICAL EXAMINATION:  GENERAL:  Patient is a pleasant white male in no apparent distress.  VITAL SIGNS:  Weight is 178.  Blood pressure 130/80, pulse 65 and regular.  HEENT:  Pupils equal, round, and reactive to light and accommodation. Extraocular movements are full.  Conjunctivae are clear.  Oropharynx is clear.  NECK:  Without JVD, adenopathy, thyromegaly, or bruits.  LUNGS:  Clear to auscultation and percussion.  CARDIAC:  Reveals a regular rate and rhythm  without gallops, murmurs, or clicks.  ABDOMEN:  Soft and nontender.  He has no hepatosplenomegaly, masses, or bruits.  EXTREMITIES:  Femoral and pedal pulses are 2+ and symmetric.  He had no edema.  NEUROLOGIC:  Nonfocal.  LABORATORY DATA:  Chest x-ray shows changes of old bypass surgery with no active disease.  ECG shows normal sinus rhythm, left bundle-branch block.  IMPRESSION: 1. Crescendo angina. 2. Status post coronary artery bypass grafting with revision in 1993. 3. History of prostate cancer. 4. Hypercholesterolemia. 5. Hypertension. 6. Osteoporosis.  PLAN:  The patient will be admitted for cardiac catheterization with further therapy pending these results. Dictated by:   Peter M. [place]azil[place], M.D. Attending:  Peter M. [place], M.D. DD:  08/02/01 TD:  08/02/01 Job: [id number]

## 2011-01-15 NOTE — Op Note (Signed)
NAME:  [name], [name]                         ACCOUNT NO.:  [id number]   MEDICAL RECORD NO.:  [id number]                   PATIENT TYPE:  AMB   LOCATION:  DSC                                  FACILITY:  [facility]   PHYSICIAN:  Nadara Mustard, M.D.                DATE OF BIRTH:  1924/10/19   DATE OF PROCEDURE:  04/02/2004  DATE OF DISCHARGE:                                 OPERATIVE REPORT   PREOPERATIVE DIAGNOSIS:  Painful hallux rigidus deformity, right great toe.   POSTOPERATIVE DIAGNOSIS:  Painful hallux rigidus deformity, right great toe.   PROCEDURE:  Right great toe metatarsophalangeal fusion.   SURGEON:  Nadara Mustard, M.D.   ANESTHESIA:  Popliteal block.   ESTIMATED BLOOD LOSS:  Minimal.   ANTIBIOTICS:  1 g Kefzol.   TOURNIQUET TIME:  Esmarch at the ankle for approximately 30 minutes.   DISPOSITION:  To PACU in stable condition.  Plan for discharge to home when  stable.   HISTORY OF PRESENT ILLNESS:  The patient is a 77 year old gentleman with a  painful hallux rigidus of the right great toe.  He has good dorsalis pedis  pulse.  He has pain with maximum plantar flexion and dorsiflexion of 20  degrees.  The patient has failed conservative care and presents at this time  for surgical intervention.  The risks and benefits were discussed, including  infection, neurovascular injury, persistent pain, failure of the fusion,  need for additional surgery.  The patient states he understands and wishes  to proceed at this time.   DESCRIPTION OF PROCEDURE:  The patient was brought to OR room 5 after  undergoing a popliteal block.  After adequate level of anesthesia obtained,  the patient's right lower extremity was prepped using Duraprep and draped  into a sterile field.  A dorsal medial incision was made over the great toe.  This was carried down through the retinaculum medially of the EHL.  This was  carried down to the retinaculum and the subperiosteal dissection was used  to  free up the joint.  Using the cup and cone reamers, 20 mm reamer, the cup  reamer was used on the metatarsal head and the cone reamer used on the  proximal phalanx.  These were aligned and the precontoured plate was placed  and provisionally fixed with a K-wire.  This was secured proximally and  distally with three screws and a compression screw was placed perpendicular  to the compression plate.  C-arm fluoroscopy was used to verify reduction in  both AP and lateral planes.  The deep fascia was closed using 2-0 Vicryl,  the skin was closed using 2-0 nylon in vertical mattress.  The wounds were  covered with Adaptic, orthopedic sponges, sterile Webril, and a Coban  dressing.  Esmarch was released after approximately 30 minutes. The patient  was then taken to the PACU in stable condition, planned for  discharge to  home, nonweightbearing for two weeks, prescription for Vicodin for  postoperative pain.                                               Nadara Mustard, M.D.    MVD/MEDQ  D:  04/02/2004  T:  04/03/2004  Job:  [id number]

## 2011-01-15 NOTE — Op Note (Signed)
NAME:  [name], [name]               ACCOUNT NO.:  [id number]   MEDICAL RECORD NO.:  [id number]          PATIENT TYPE:  AMB   LOCATION:  DSC                          FACILITY:  [facility]   PHYSICIAN:  Nadara Mustard, MD     DATE OF BIRTH:  Oct 27, 1924   DATE OF PROCEDURE:  07/15/2004  DATE OF DISCHARGE:                                 OPERATIVE REPORT   PREOPERATIVE DIAGNOSIS:  Infection, right great toe metatarsophalangeal  joint, status post open reduction internal fixation with fusion and a dorsal  plate.   POSTOPERATIVE DIAGNOSIS:  Infection, right great toe metatarsophalangeal  joint, status post open reduction internal fixation with fusion and a dorsal  plate.   OPERATION PERFORMED:  1.  Removal of retained hardware.  2.  Debridement of skin, soft tissue and bone.  3.  Obtain aerobic and anaerobic as well as fungal cultures.  4.  Fixation of metatarsophalangeal joint with two K-wires.   SURGEON:  Nadara Mustard, M.D.   ANESTHESIA:  Ankle block.   ESTIMATED BLOOD LOSS:  Minimal.   ANTIBIOTICS:  Vancomycin 1 g preoperatively.   TOURNIQUET TIME:  Esmarch to the ankle for approximately 35 minutes.   DISPOSITION:  To post anesthesia care unit in stable condition.  Plan for 23  hour observation and discharge by nursing in the morning.   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  He underwent previous internal fixation and fusion.  The patient did  develop some infection postoperatively and this was treated with IV  antibiotics.  The patient had all symptoms resolve.  However, he has now had  a recurrent swelling, redness and presents at this time with chronic  infection for removal of retained hardware.  Risks and benefits were  discussed including infection, neurovascular injury, persistent pain, need  for additional surgery.  Patient states he understands and  wishes to proceed  at this time.   DESCRIPTION OF PROCEDURE:  The patient underwent ankle block and then was  brought to operating room 5.  After adequate level of anesthesia obtained,  the patient's right lower extremity was prepped using DuraPrep and draped  into a sterile field.  The leg was elevated and an Esmarch was wrapped  around the ankle for tourniquet control.  His previous dorsal incision was  used and this was carried down to the hardware. There was a serous fluid  collection.  No gross purulence, but there was serous fluid around the  plate.  This was cultured both aerobic, anaerobically and fungal.  The plate  was removed.  The fusion site had a nonunion.  Using the cup and cone  reamers, the ends of the bone were again reamed with the cup and cone 16 mm  reamers.  All soft tissue was debrided.  The bone and soft tissue was  irrigated with normal saline.  The fusion site was then aligned and  stabilized with two K-wires.  C-arm fluoroscopy verified reduction.  The  wound was again irrigated.  The Esmarch was released and hemostasis was  obtained.  Total tourniquet time approximately 35 minutes.  Using a far-near-  near-far suture with 2-0 nylon the wound was closed without tension on the  skin.  The wound was covered with Adaptic orthopedic sponges, sterile Webril  and a Coban dressing.  The patient was then taken to PACU in stable  condition, plan for 23 hour observation and discharge by nursing in the  morning.      Vernia Buff   MVD/MEDQ  D:  07/15/2004  T:  07/15/2004  Job:  [id number]

## 2011-01-15 NOTE — Cardiovascular Report (Signed)
[place]  Patient:    [name], [name] Visit Number: [id number] MRN: [id number]          Service Type: CAT Location: [facility] 2859 [id number] Attending Physician:  Swazil[place], Peter Manning Dictated by:   Peter M. Sw[place]il[place], M.D. Proc. Date: 08/03/01 Admit Date:  08/03/2001 Discharge Date: 08/03/2001   CC:         Terald Sleeper, M.D.  Thomas A. Patty Sermons, M.D.   Cardiac Catheterization  INDICATIONS FOR PROCEDURE: The patient is a 77 year old white male, who is status post coronary artery bypass surgery in 1986 with re-do in 1993 who presents with symptoms of refractory chest pain.  ACCESS: Via the right femoral artery using the standard Seldinger technique.  EQUIPMENT: The 6 French 4 cm right and left Judkins catheter, 6 French pigtail catheter, 6 French arterial sheath.  MEDICATIONS: Local anesthesia with 1% Xylocaine.  CONTRAST: Omnipaque 150 cc.  HEMODYNAMIC DATA: Aortic pressure is 112/65 with a mean of 87.  Left ventricular pressure is 124 with an EDP of 9 mmHg.  ANGIOGRAPHIC DATA: Left coronary artery: The left coronary artery arises and distributes normally.  Left main: The left main coronary artery is short without significant disease.  Left anterior descending: The left anterior descending artery is occluded at the ostium.  Left circumflex: The left circumflex coronary artery demonstrates 60-70% stenosis proximally. The first and second obtuse marginal branches are occluded.  Right coronary artery: The right coronary artery is occluded proximally with some bridging collaterals to the mid right coronary artery.  The saphenous vein graft to the first and second obtuse marginal branch is widely patent.  The saphenous vein graft to the first diagonal branch is widely patent.  There is a free right internal mammary artery graft to the right coronary artery and this is widely patent as well.  There is a 70% stenosis in  the posterolateral branch of the right coronary artery.  The LIMA graft to the LAD is widely patent.  LEFT VENTRICULAR ANGIOGRAPHY: Left ventricular angiography performed in the RAO view demonstrates normal left ventricular size and contractility with normal systolic function. Ejection fraction is estimated at 65%. There is no mitral regurgitation or prolapse.  FINAL INTERPRETATION: 1. Severe three-vessel obstructive atherosclerotic coronary artery disease. 2. All bypass grafts remain patent. 3. Normal left ventricular function. 4. No significant change since prior study in August of 1997. Dictated by:   Peter M. Swaziland, M.D. Attending Physician:  [place], Peter Manning DD:  08/03/01 TD:  08/03/01 Job: [phone]

## 2011-03-09 ENCOUNTER — Other Ambulatory Visit: Payer: Self-pay | Admitting: *Deleted

## 2011-03-09 DIAGNOSIS — E785 Hyperlipidemia, unspecified: Secondary | ICD-10-CM

## 2011-03-26 ENCOUNTER — Other Ambulatory Visit (INDEPENDENT_AMBULATORY_CARE_PROVIDER_SITE_OTHER): Payer: Medicare Other | Admitting: *Deleted

## 2011-03-26 DIAGNOSIS — E785 Hyperlipidemia, unspecified: Secondary | ICD-10-CM

## 2011-03-26 LAB — LIPID PANEL
Cholesterol: 177 mg/dL (ref 0–200)
LDL Cholesterol: 90 mg/dL (ref 0–99)
Triglycerides: 83 mg/dL (ref 0.0–149.0)
VLDL: 16.6 mg/dL (ref 0.0–40.0)

## 2011-03-26 LAB — BASIC METABOLIC PANEL
BUN: 17 mg/dL (ref 6–23)
Calcium: 9.2 mg/dL (ref 8.4–10.5)
Creatinine, Ser: 1 mg/dL (ref 0.4–1.5)
GFR: 77.44 mL/min (ref >60.00)
Glucose, Bld: 95 mg/dL (ref 70–99)

## 2011-03-26 LAB — HEPATIC FUNCTION PANEL
Albumin: 4.1 g/dL (ref 3.5–5.2)
Total Bilirubin: 0.5 mg/dL (ref 0.3–1.2)

## 2011-03-29 ENCOUNTER — Ambulatory Visit (INDEPENDENT_AMBULATORY_CARE_PROVIDER_SITE_OTHER): Payer: Medicare Other | Admitting: Cardiology

## 2011-03-29 ENCOUNTER — Encounter: Payer: Self-pay | Admitting: Cardiology

## 2011-03-29 VITALS — BP 130/70 | HR 55 | Wt 165.0 lb

## 2011-03-29 DIAGNOSIS — E785 Hyperlipidemia, unspecified: Secondary | ICD-10-CM

## 2011-03-29 DIAGNOSIS — I509 Heart failure, unspecified: Secondary | ICD-10-CM

## 2011-03-29 DIAGNOSIS — Z951 Presence of aortocoronary bypass graft: Secondary | ICD-10-CM

## 2011-03-29 DIAGNOSIS — I519 Heart disease, unspecified: Secondary | ICD-10-CM

## 2011-03-29 DIAGNOSIS — Z9889 Other specified postprocedural states: Secondary | ICD-10-CM

## 2011-03-29 DIAGNOSIS — I447 Left bundle-branch block, unspecified: Secondary | ICD-10-CM

## 2011-03-29 NOTE — Assessment & Plan Note (Signed)
This patient has a history of known ischemic heart disease.  He has a history of a left bundle-branch block.  EKG today shows left bundle-branch block unchanged from 03/30/10.  The patient is not having any Stokes-Adams symptoms

## 2011-03-29 NOTE — Assessment & Plan Note (Signed)
The patient has a past history of dyslipidemia.  We reviewed his blood work from 03/26/11 which is excellent and shows a good response to therapy.  He is not having any myalgias from his therapy

## 2011-03-29 NOTE — Progress Notes (Signed)
[name] Date of Birth:  [birth year] [facility] / [facility] [address]   [address] [place], [place]  [address] [phone]           Fax   [phone]  History of Present Illness: This pleasant 77 year old gentleman is seen for a scheduled 4 month followup office visit.  He has a history of ischemic ischemic heart disease.  He had an initial CABG in 1986 and a redo CABG in 1993.  In October of 2008 he had a PTCA with stent.  In February 2010 he was treated with a drug-eluting stent.  In August 2010 he had a bacteremia secondary to streptococcal skin infection at that time had a slight bump in his cardiac enzymes consistent with a small non-Q-wave myocardial infarction.  At that time he also had some evidence of congestive heart failure and chronic hypoxia secondary to COPD and went home on home oxygen which she no longer requires.  He is a nonsmoker.  His last nuclear stress test was 10/27/09 and did show moderate area of reversible ischemia in the inferolateral wall.  His ejection fraction is 64%.  He has had very little angina recently.  He does get some slight angina with walking relieved by a single nitroglycerin.  His had problems with low blood pressure secondary to medication associated with symptoms of dizziness but he has not had syncope.  He admits to a lot of recent stress.  He's had some very close family members and close friends who have died in the past several months.  Current Outpatient Prescriptions  Medication Sig Dispense Refill  . ALBUTEROL IN Inhale into the lungs as needed.        [name] aspirin 325 MG tablet Take 325 mg by mouth daily.        . calcium-vitamin D (OSCAL WITH D 500-200) 500-200 MG-UNIT per tablet Take 1 tablet by mouth 2 (two) times daily.        . CarBAMazepine (TEGRETOL PO) Take 300 mg by mouth 2 (two) times daily.       . clopidogrel (PLAVIX) 75 MG tablet Take 75 mg by mouth daily.        . finasteride (PROSCAR) 5 MG tablet Take 5 mg by  mouth daily.        . furosemide (LASIX) 40 MG tablet Take 20 mg by mouth daily.        . GuaiFENesin (MUCINEX PO) Take by mouth as needed.        . isosorbide mononitrate (IMDUR) 60 MG 24 hr tablet Take 60 mg by mouth daily.        [name] L-Methylfolate-B6-B12 (METANX PO) Take by mouth 2 (two) times daily as needed.        . metoprolol (TOPROL-XL) 50 MG 24 hr tablet Take 50 mg by mouth daily.        . nitroGLYCERIN (NITROSTAT) 0.4 MG SL tablet Place 1 tablet (0.4 mg total) under the tongue every 5 (five) minutes as needed.  25 tablet  11  . Polyethylene Glycol 3350 (MIRALAX PO) Take by mouth as needed.        . potassium chloride SA (K-DUR,KLOR-CON) 20 MEQ tablet Take 20 mEq by mouth every Monday, Wednesday, and Friday.       . Pseudoephedrine-Guaifenesin (MUCINEX D PO) Take by mouth.        . ranitidine (ZANTAC) 150 MG tablet Take 150 mg by mouth daily.        [name]  rosuvastatin (CRESTOR) 20 MG tablet Take 20 mg by mouth daily.        . sertraline (ZOLOFT) 100 MG tablet Take 150 mg by mouth daily.       . temazepam (RESTORIL) 15 MG capsule Take 1 capsule (15 mg total) by mouth at bedtime as needed for sleep.  30 capsule  5    No Known Allergies  Patient Active Problem List  Diagnoses  . HYPERLIPIDEMIA  . PERIPHERAL NEUROPATHY  . HYPERTENSION  . Cor Athrscl-Uns Vessel  . GERD  . HERNIA  . PSORIASIS  . OSTEOARTHRITIS  . OSTEOPOROSIS  . PROSTATE CANCER, HX OF  . Ischemic heart disease  . Myocardial infarction  . CHF (congestive heart failure)  . Hypoxia  . COPD (chronic obstructive pulmonary disease)  . Peripheral neuropathy  . Dizziness  . Dyslipidemia (high LDL; low HDL)  . LBBB (left bundle branch block)  . Hx of CABG    History  Smoking status  . Never Smoker   Smokeless tobacco  . Not on file    History  Alcohol Use No    Family History  Problem Relation Age of Onset  . Heart attack Mother   . Addison's disease Father   . Heart attack Sister     Review of  Systems: Constitutional: no fever chills diaphoresis or fatigue or change in weight.  Head and neck: no hearing loss, no epistaxis, no photophobia or visual disturbance. Respiratory: No cough, shortness of breath or wheezing. Cardiovascular: No chest pain peripheral edema, palpitations. Gastrointestinal: No abdominal distention, no abdominal pain, no change in bowel habits hematochezia or melena. Genitourinary: No dysuria, no frequency, no urgency, He has nocturia 2-3 times a night and is followed by urology.  He recently finished up a one-month course of antibiotic treatment for a bladder infection.  He has had some urinary incontinence which he attributes to the furosemide. Musculoskeletal:No arthralgias, no back pain, no gait disturbance or myalgias. Neurological: No dizziness, no headaches, no numbness, no seizures, no syncope, no weakness, no tremors. Hematologic: No lymphadenopathy, no easy bruising. Psychiatric: No confusion, no hallucinations, no sleep disturbance.    Physical Exam: Filed Vitals:   03/29/11 0925  BP: 130/70  Pulse: 55   The general appearance reveals a well-developed well-nourished gentleman in no distress.Pupils equal and reactive.   Extraocular Movements are full.  There is no scleral icterus.  The mouth and pharynx are normal.  The neck is supple.  The carotids reveal no bruits.  The jugular venous pressure is normal.  The thyroid is not enlarged.  There is no lymphadenopathy.  The chest is clear to percussion and auscultation. There are no rales or rhonchi. Expansion of the chest is symmetrical.  The precordium is quiet.  The first heart sound is normal.  The second heart sound is physiologically split.  There is no murmur gallop rub or click.  There is no abnormal lift or heave.The heart rate is 55 and regular The abdomen is soft and nontender. Bowel sounds are normal. The liver and spleen are not enlarged. There Are no abdominal masses. There are no  bruits.  Normal extremity without phlebitis or edema and the pedal pulses are present.Strength is normal and symmetrical in all extremities.  There is no lateralizing weakness.  There are no sensory deficits.  The skin is warm and dry.  There is no rash.    EKG today shows sinus bradycardia and left bundle branch block    Assessment /  Plan:  He is going to reduce his dose of furosemide by half.  At the present time he does not have any signs or symptoms of congestive heart failure.  He will check on his present dose of furosemide at home and bring all of his pills with him next time.  We will also cut back on his potassium tablets to half the present dose.  He'll be rechecked in 4 months for followup office visit

## 2011-03-29 NOTE — Assessment & Plan Note (Signed)
The patient has known ischemic heart disease.  He had his first CABG in 1986 and he had a redo CABG in 1993.  He had PTCA with a stent in October 2008.  In February 2010 he had a drug-eluting stent.He has not been expressing any recent significant chest pain or angina.  He has had some mild dizziness which occurs in the afternoon now that he takes his isosorbide mononitrate at previously when he took it at breakfast his blood pressure in the morning was much too low.

## 2011-03-29 NOTE — Assessment & Plan Note (Signed)
The patient has a past history of congestive heart failure.  Recently he has not been experiencing any symptoms of heart failure.  The Lasix causes him to have a lot of urinary urgency and nocturia.  He would like to cut back on the dose.  He is not certain whether he is taking 40 mg of Lasix or 20 mg of Lasix at the present time.  He will check at home and which ever he is taking we will have him take just half his present dose and observe response.  If he is taking just 20 mg of Lasix daily now, he will decrease that to every other day.  At the same time he will decrease his potassium supplement to 3 times a week.

## 2011-03-30 ENCOUNTER — Encounter: Payer: Self-pay | Admitting: Cardiology

## 2011-05-13 ENCOUNTER — Ambulatory Visit: Payer: Medicare Other | Attending: Family Medicine | Admitting: Physical Therapy

## 2011-05-13 DIAGNOSIS — M545 Low back pain, unspecified: Secondary | ICD-10-CM | POA: Insufficient documentation

## 2011-05-13 DIAGNOSIS — M6281 Muscle weakness (generalized): Secondary | ICD-10-CM | POA: Insufficient documentation

## 2011-05-13 DIAGNOSIS — IMO0001 Reserved for inherently not codable concepts without codable children: Secondary | ICD-10-CM | POA: Insufficient documentation

## 2011-05-13 DIAGNOSIS — R262 Difficulty in walking, not elsewhere classified: Secondary | ICD-10-CM | POA: Insufficient documentation

## 2011-05-18 ENCOUNTER — Ambulatory Visit: Payer: Medicare Other | Admitting: Physical Therapy

## 2011-05-20 ENCOUNTER — Telehealth: Payer: Self-pay | Admitting: Cardiology

## 2011-05-20 ENCOUNTER — Encounter: Payer: Medicare Other | Admitting: Physical Therapy

## 2011-05-20 NOTE — Telephone Encounter (Signed)
Is it ok for him to come off Plavix prior to epidural?  How long?

## 2011-05-20 NOTE — Telephone Encounter (Signed)
[name], stated she would let patient know

## 2011-05-20 NOTE — Telephone Encounter (Signed)
Stop Plavix one week prior to epidural

## 2011-05-20 NOTE — Telephone Encounter (Signed)
Per [place] calling pt need an epidural injection. Need to discuss coming off plavix.

## 2011-05-21 ENCOUNTER — Other Ambulatory Visit: Payer: Self-pay | Admitting: Family Medicine

## 2011-05-21 DIAGNOSIS — M5126 Other intervertebral disc displacement, lumbar region: Secondary | ICD-10-CM

## 2011-05-25 ENCOUNTER — Encounter: Payer: Medicare Other | Admitting: Physical Therapy

## 2011-05-26 ENCOUNTER — Ambulatory Visit
Admission: RE | Admit: 2011-05-26 | Discharge: 2011-05-26 | Disposition: A | Discharge status: Home / Self Care | Payer: Medicare Other | Source: Ambulatory Visit | Attending: Family Medicine | Admitting: Family Medicine

## 2011-05-26 DIAGNOSIS — M5126 Other intervertebral disc displacement, lumbar region: Secondary | ICD-10-CM

## 2011-05-26 MED ORDER — METHYLPREDNISOLONE ACETATE 40 MG/ML INJ SUSP (RADIOLOG
120.0000 mg | Freq: Once | INTRAMUSCULAR | Status: AC
  Administered 2011-05-26: 120 mg via EPIDURAL

## 2011-05-26 MED ORDER — IOHEXOL 180 MG/ML  SOLN
1.0000 mL | Freq: Once | INTRAMUSCULAR | Status: AC | PRN
  Administered 2011-05-26: 1 mL via EPIDURAL

## 2011-05-27 ENCOUNTER — Encounter: Payer: Medicare Other | Admitting: Physical Therapy

## 2011-06-01 ENCOUNTER — Other Ambulatory Visit: Payer: Self-pay | Admitting: Cardiology

## 2011-06-01 ENCOUNTER — Encounter: Payer: Medicare Other | Admitting: Physical Therapy

## 2011-06-01 ENCOUNTER — Other Ambulatory Visit: Payer: Self-pay | Admitting: Family Medicine

## 2011-06-01 DIAGNOSIS — M549 Dorsalgia, unspecified: Secondary | ICD-10-CM

## 2011-06-01 MED ORDER — RANITIDINE HCL 150 MG PO TABS: 150.0000 mg | ORAL_TABLET | Freq: Every day | ORAL | Status: DC

## 2011-06-03 ENCOUNTER — Encounter: Payer: Medicare Other | Admitting: Physical Therapy

## 2011-06-08 ENCOUNTER — Encounter: Payer: Medicare Other | Admitting: Physical Therapy

## 2011-06-09 ENCOUNTER — Ambulatory Visit
Admission: RE | Admit: 2011-06-09 | Discharge: 2011-06-09 | Disposition: A | Discharge status: Home / Self Care | Payer: Medicare Other | Source: Ambulatory Visit | Attending: Family Medicine | Admitting: Family Medicine

## 2011-06-09 ENCOUNTER — Other Ambulatory Visit: Payer: Self-pay | Admitting: Family Medicine

## 2011-06-09 DIAGNOSIS — M549 Dorsalgia, unspecified: Secondary | ICD-10-CM

## 2011-06-09 LAB — URINE CULTURE: Colony Count: 10000

## 2011-06-09 LAB — CBC
HCT: 39.5
HCT: 42.5
Hemoglobin: 14.5
MCHC: 33.7
MCV: 96.3
Platelets: 211
Platelets: 212
RDW: 13.4
RDW: 13.5
WBC: 6.8

## 2011-06-09 LAB — BASIC METABOLIC PANEL
BUN: 12
BUN: 8
CO2: 28
Calcium: 8.8
GFR calc non Af Amer: >60
Glucose, Bld: 119 — ABNORMAL HIGH
Glucose, Bld: 96
Potassium: 4.3
Sodium: 134 — ABNORMAL LOW
Sodium: 137

## 2011-06-09 LAB — I-STAT 8, (EC8 V) (CONVERTED LAB)
Acid-Base Excess: 2
Bicarbonate: 27.7 — ABNORMAL HIGH
Glucose, Bld: 117 — ABNORMAL HIGH
Potassium: 4.5
TCO2: 29
pCO2, Ven: 48
pH, Ven: 7.37 — ABNORMAL HIGH

## 2011-06-09 LAB — LIPID PANEL
Cholesterol: 131
HDL: 35 — ABNORMAL LOW
LDL Cholesterol: 85
Triglycerides: 57

## 2011-06-09 LAB — COMPREHENSIVE METABOLIC PANEL
ALT: 16
BUN: 9
CO2: 28
Calcium: 9.6
GFR calc non Af Amer: >60
Glucose, Bld: 97
Sodium: 139
Total Protein: 6.7

## 2011-06-09 LAB — CK TOTAL AND CKMB (NOT AT ARMC)
CK, MB: 56.4 — ABNORMAL HIGH
Relative Index: 11.4 — ABNORMAL HIGH
Relative Index: 12.4 — ABNORMAL HIGH
Relative Index: 13.1 — ABNORMAL HIGH

## 2011-06-09 LAB — POCT CARDIAC MARKERS
CKMB, poc: 1.3
Myoglobin, poc: 157
Troponin i, poc: <0.05

## 2011-06-09 LAB — CARDIAC PANEL(CRET KIN+CKTOT+MB+TROPI)
CK, MB: 141.1 — ABNORMAL HIGH
CK, MB: 25.4 — ABNORMAL HIGH
Relative Index: 8.8 — ABNORMAL HIGH
Troponin I: 19.86

## 2011-06-09 LAB — URINALYSIS, ROUTINE W REFLEX MICROSCOPIC
Glucose, UA: NEGATIVE
Hgb urine dipstick: NEGATIVE
Specific Gravity, Urine: 1.015
pH: 6

## 2011-06-09 LAB — PROTIME-INR: Prothrombin Time: 13.5

## 2011-06-09 LAB — TROPONIN I: Troponin I: >100

## 2011-06-09 LAB — VITAMIN B12: Vitamin B-12: 316 (ref 211–911)

## 2011-06-09 LAB — APTT: aPTT: 28

## 2011-06-09 MED ORDER — METHYLPREDNISOLONE ACETATE 40 MG/ML INJ SUSP (RADIOLOG
120.0000 mg | Freq: Once | INTRAMUSCULAR | Status: AC
  Administered 2011-06-09: 120 mg via INTRA_ARTICULAR

## 2011-06-09 MED ORDER — IOHEXOL 180 MG/ML  SOLN
1.0000 mL | Freq: Once | INTRAMUSCULAR | Status: AC | PRN
  Administered 2011-06-09: 1 mL via INTRA_ARTICULAR

## 2011-06-10 ENCOUNTER — Other Ambulatory Visit: Payer: Self-pay | Admitting: Family Medicine

## 2011-06-10 ENCOUNTER — Encounter: Payer: Medicare Other | Admitting: Physical Therapy

## 2011-06-15 ENCOUNTER — Other Ambulatory Visit: Payer: Self-pay | Admitting: Family Medicine

## 2011-06-15 ENCOUNTER — Encounter: Payer: Medicare Other | Admitting: Physical Therapy

## 2011-06-15 ENCOUNTER — Ambulatory Visit (INDEPENDENT_AMBULATORY_CARE_PROVIDER_SITE_OTHER): Payer: Medicare Other

## 2011-06-15 DIAGNOSIS — Z23 Encounter for immunization: Secondary | ICD-10-CM

## 2011-06-15 DIAGNOSIS — M545 Low back pain, unspecified: Secondary | ICD-10-CM

## 2011-06-17 ENCOUNTER — Encounter: Payer: Medicare Other | Admitting: Physical Therapy

## 2011-06-22 ENCOUNTER — Other Ambulatory Visit: Payer: Self-pay | Admitting: Family Medicine

## 2011-06-22 ENCOUNTER — Encounter: Payer: Medicare Other | Admitting: Physical Therapy

## 2011-06-22 ENCOUNTER — Ambulatory Visit
Admission: RE | Admit: 2011-06-22 | Discharge: 2011-06-22 | Disposition: A | Discharge status: Home / Self Care | Payer: Medicare Other | Source: Ambulatory Visit | Attending: Family Medicine | Admitting: Family Medicine

## 2011-06-22 DIAGNOSIS — M545 Low back pain: Secondary | ICD-10-CM

## 2011-06-22 DIAGNOSIS — M79604 Pain in right leg: Secondary | ICD-10-CM

## 2011-06-22 DIAGNOSIS — M25551 Pain in right hip: Secondary | ICD-10-CM

## 2011-06-24 ENCOUNTER — Encounter: Payer: Medicare Other | Admitting: Physical Therapy

## 2011-06-25 ENCOUNTER — Other Ambulatory Visit: Payer: Self-pay | Admitting: Family Medicine

## 2011-06-25 ENCOUNTER — Ambulatory Visit
Admission: RE | Admit: 2011-06-25 | Discharge: 2011-06-25 | Disposition: A | Discharge status: Home / Self Care | Payer: Medicare Other | Source: Ambulatory Visit | Attending: Family Medicine | Admitting: Family Medicine

## 2011-06-25 DIAGNOSIS — M25551 Pain in right hip: Secondary | ICD-10-CM

## 2011-06-25 DIAGNOSIS — M79604 Pain in right leg: Secondary | ICD-10-CM

## 2011-06-25 DIAGNOSIS — G609 Hereditary and idiopathic neuropathy, unspecified: Secondary | ICD-10-CM

## 2011-06-25 DIAGNOSIS — M549 Dorsalgia, unspecified: Secondary | ICD-10-CM

## 2011-06-25 MED ORDER — CEFAZOLIN SODIUM 1-5 GM-% IV SOLN
1.0000 g | Freq: Three times a day (TID) | INTRAVENOUS | Status: DC
  Administered 2011-06-25: 1 g via INTRAVENOUS

## 2011-06-25 MED ORDER — ONDANSETRON HCL 4 MG/2ML IJ SOLN
4.0000 mg | Freq: Once | INTRAMUSCULAR | Status: AC
  Administered 2011-06-25: 4 mg via INTRAMUSCULAR

## 2011-06-25 MED ORDER — KETOROLAC TROMETHAMINE 30 MG/ML IJ SOLN
30.0000 mg | Freq: Once | INTRAMUSCULAR | Status: AC
  Administered 2011-06-25: 30 mg via INTRAVENOUS

## 2011-06-25 MED ORDER — MEPERIDINE HCL 100 MG/ML IJ SOLN
75.0000 mg | Freq: Once | INTRAMUSCULAR | Status: AC
  Administered 2011-06-25: 75 mg via INTRAMUSCULAR

## 2011-06-25 MED ORDER — SODIUM CHLORIDE 0.9 % IV SOLN
INTRAVENOUS | Status: DC
  Administered 2011-06-25: 09:00:00 via INTRAVENOUS

## 2011-06-25 MED ORDER — FENTANYL CITRATE 0.05 MG/ML IJ SOLN
25.0000 ug | INTRAMUSCULAR | Status: DC | PRN
  Administered 2011-06-25: 25 ug via INTRAVENOUS

## 2011-06-25 MED ORDER — METHYLPREDNISOLONE ACETATE 40 MG/ML INJ SUSP (RADIOLOG: 120.0000 mg | Freq: Once | INTRAMUSCULAR | Status: DC

## 2011-06-25 MED ORDER — MIDAZOLAM HCL 2 MG/2ML IJ SOLN
1.0000 mg | INTRAMUSCULAR | Status: DC | PRN
  Administered 2011-06-25: 1 mg via INTRAVENOUS

## 2011-06-25 NOTE — Patient Instructions (Signed)
Radio Frequency Ablation Post Procedure Discharge Instructions  1. May resume a regular diet and any medications that you routinely take (including pain medications). 2. No driving day of procedure. 3. Upon discharge go home and rest for at least 4 hours.  May use an ice pack as needed to injection sites on back.    Please contact our office at [phone] for the following symptoms:   Fever greater than 100 degrees  Increased swelling, pain, or redness at injection site.  May remove bandades later today.   Thank you for visiting [facility].

## 2011-07-01 ENCOUNTER — Telehealth: Payer: Self-pay

## 2011-07-01 ENCOUNTER — Other Ambulatory Visit: Payer: Self-pay | Admitting: Cardiology

## 2011-07-01 ENCOUNTER — Other Ambulatory Visit: Payer: Self-pay | Admitting: Family Medicine

## 2011-07-01 DIAGNOSIS — M549 Dorsalgia, unspecified: Secondary | ICD-10-CM

## 2011-07-01 NOTE — Telephone Encounter (Signed)
The patient does not need Lovenox bridge.  Just stop Plavix 6 days before surgery and resume one day after surgery

## 2011-07-01 NOTE — Telephone Encounter (Signed)
Dr Jorge Mandril called the office to get Cardiology's opinion about the pt having a spinal injection.  The pt is having low back pain and Dr Jorge Mandril feels that the pt has a pinched nerve.  The pt has had multiple injections in the past and has previously held his plavix 6 days prior to injection.  Dr Jorge Mandril was wondering if the pt needs to be bridged with Lovenox 6 days prior to injection, stop Lovenox the day of injection and resume plavix the day after injection.  I will forward this information to Dr Patty Sermons and his nurse Juliette Alcide to review.

## 2011-07-02 ENCOUNTER — Other Ambulatory Visit: Payer: Self-pay | Admitting: *Deleted

## 2011-07-02 DIAGNOSIS — G4700 Insomnia, unspecified: Secondary | ICD-10-CM

## 2011-07-02 MED ORDER — TEMAZEPAM 15 MG PO CAPS: 15.0000 mg | ORAL_CAPSULE | Freq: Every evening | ORAL | Status: DC | PRN

## 2011-07-02 NOTE — Telephone Encounter (Signed)
Advised Dr Jorge Mandril office and faxed

## 2011-07-09 ENCOUNTER — Ambulatory Visit
Admission: RE | Admit: 2011-07-09 | Discharge: 2011-07-09 | Disposition: A | Discharge status: Home / Self Care | Payer: Medicare Other | Source: Ambulatory Visit | Attending: Family Medicine | Admitting: Family Medicine

## 2011-07-09 DIAGNOSIS — M549 Dorsalgia, unspecified: Secondary | ICD-10-CM

## 2011-07-09 MED ORDER — METHYLPREDNISOLONE ACETATE 40 MG/ML INJ SUSP (RADIOLOG
120.0000 mg | Freq: Once | INTRAMUSCULAR | Status: AC
  Administered 2011-07-09: 120 mg via EPIDURAL

## 2011-07-09 MED ORDER — IOHEXOL 180 MG/ML  SOLN
1.0000 mL | Freq: Once | INTRAMUSCULAR | Status: AC | PRN
  Administered 2011-07-09: 1 mL via EPIDURAL

## 2011-07-26 ENCOUNTER — Ambulatory Visit (INDEPENDENT_AMBULATORY_CARE_PROVIDER_SITE_OTHER): Payer: Medicare Other | Admitting: *Deleted

## 2011-07-26 DIAGNOSIS — I509 Heart failure, unspecified: Secondary | ICD-10-CM

## 2011-07-26 LAB — LIPID PANEL
HDL: 61.8 mg/dL (ref >39.00)
Total CHOL/HDL Ratio: 3
VLDL: 26 mg/dL (ref 0.0–40.0)

## 2011-07-26 LAB — HEPATIC FUNCTION PANEL
Bilirubin, Direct: 0 mg/dL (ref 0.0–0.3)
Total Bilirubin: 0.6 mg/dL (ref 0.3–1.2)

## 2011-07-26 LAB — BASIC METABOLIC PANEL
BUN: 14 mg/dL (ref 6–23)
CO2: 27 mEq/L (ref 19–32)
Glucose, Bld: 99 mg/dL (ref 70–99)
Potassium: 4.2 mEq/L (ref 3.5–5.1)
Sodium: 141 mEq/L (ref 135–145)

## 2011-07-27 ENCOUNTER — Other Ambulatory Visit: Payer: Self-pay | Admitting: Dermatology

## 2011-07-28 ENCOUNTER — Encounter: Payer: Self-pay | Admitting: Cardiology

## 2011-07-28 ENCOUNTER — Ambulatory Visit (INDEPENDENT_AMBULATORY_CARE_PROVIDER_SITE_OTHER): Payer: Medicare Other | Admitting: Cardiology

## 2011-07-28 VITALS — BP 120/70 | HR 60 | Ht 70.0 in | Wt 166.0 lb

## 2011-07-28 DIAGNOSIS — I259 Chronic ischemic heart disease, unspecified: Secondary | ICD-10-CM

## 2011-07-28 DIAGNOSIS — E7800 Pure hypercholesterolemia, unspecified: Secondary | ICD-10-CM

## 2011-07-28 DIAGNOSIS — M21371 Foot drop, right foot: Secondary | ICD-10-CM | POA: Insufficient documentation

## 2011-07-28 DIAGNOSIS — M216X9 Other acquired deformities of unspecified foot: Secondary | ICD-10-CM

## 2011-07-28 DIAGNOSIS — I10 Essential (primary) hypertension: Secondary | ICD-10-CM

## 2011-07-28 DIAGNOSIS — I509 Heart failure, unspecified: Secondary | ICD-10-CM

## 2011-07-28 DIAGNOSIS — I447 Left bundle-branch block, unspecified: Secondary | ICD-10-CM

## 2011-07-28 NOTE — Assessment & Plan Note (Signed)
No recent symptoms of congestive failure or orthopnea or paroxysmal nocturnal dyspnea.

## 2011-07-28 NOTE — Patient Instructions (Signed)
Your physician recommends that you continue on your current medications as directed. Please refer to the Current Medication list given to you today.  Your physician recommends that you schedule a follow-up appointment in: 4 months with fasting labs (lp/bmet/hfp)  

## 2011-07-28 NOTE — Assessment & Plan Note (Signed)
Blood pressure has been stable on current therapy.  No dizziness or syncope. 

## 2011-07-28 NOTE — Progress Notes (Signed)
[name] Date of Birth:  06/27/1925 [facility] / [facility] [address]   [address] [place], [place]  [address] [phone]           Fax   [phone]  History of Present Illness: This pleasant 77 year old gentleman is seen for a scheduled followup office visit.  He has known ischemic heart disease per he had coronary artery bypass graft surgery in 1986.  He had a redo CABG in 1993.  He had PTCA with stent in October 2008.  He had a drug-eluting stent in February 2010.  He had a small non-Q myocardial infarction in August 2010.  History of congestive heart failure and COPD.  His last stress test was 10/27/09 there was a moderate area of reversible ischemia in the inferolateral wall with an ejection fraction of 64% and he is being treated medically.  Symptoms are well controlled with sublingual nitroglycerin.  He has not been expressing any recent chest pain and has been less physically active.  Current Outpatient Prescriptions  Medication Sig Dispense Refill  . ALBUTEROL IN Inhale into the lungs as needed.        [name] aspirin 325 MG tablet Take 325 mg by mouth daily.        . calcium-vitamin D (OSCAL WITH D 500-200) 500-200 MG-UNIT per tablet Take 1 tablet by mouth 2 (two) times daily.        . CarBAMazepine (TEGRETOL PO) Take 300 mg by mouth 2 (two) times daily.       . clopidogrel (PLAVIX) 75 MG tablet Take 75 mg by mouth daily.        . finasteride (PROSCAR) 5 MG tablet Take 5 mg by mouth daily.        . furosemide (LASIX) 40 MG tablet Take 20 mg by mouth daily. 20mg  every other day      . GuaiFENesin (MUCINEX PO) Take by mouth as needed.        . isosorbide mononitrate (IMDUR) 60 MG 24 hr tablet TAKE 1 TABLET BY MOUTH EVERY MORNING  30 tablet  6  . metoprolol (TOPROL-XL) 50 MG 24 hr tablet Take 50 mg by mouth daily.        . nitroGLYCERIN (NITROSTAT) 0.4 MG SL tablet Place 1 tablet (0.4 mg total) under the tongue every 5 (five) minutes as needed.  25 tablet  11  .  Polyethylene Glycol 3350 (MIRALAX PO) Take by mouth as needed.        . potassium chloride SA (K-DUR,KLOR-CON) 20 MEQ tablet Take 20 mEq by mouth every Monday, Wednesday, and Friday.       . ranitidine (ZANTAC) 150 MG tablet Take 1 tablet (150 mg total) by mouth daily.  90 tablet  3  . rosuvastatin (CRESTOR) 20 MG tablet Take 20 mg by mouth daily.        . sertraline (ZOLOFT) 100 MG tablet Take 150 mg by mouth daily.       . temazepam (RESTORIL) 15 MG capsule Take 1 capsule (15 mg total) by mouth at bedtime as needed for sleep.  30 capsule  5    No Known Allergies  Patient Active Problem List  Diagnoses  . HYPERLIPIDEMIA  . PERIPHERAL NEUROPATHY  . HYPERTENSION  . Cor Athrscl-Uns Vessel  . GERD  . HERNIA  . PSORIASIS  . OSTEOARTHRITIS  . OSTEOPOROSIS  . PROSTATE CANCER, HX OF  . Ischemic heart disease  . Myocardial infarction  . CHF (congestive heart failure)  .  Hypoxia  . COPD (chronic obstructive pulmonary disease)  . Peripheral neuropathy  . Dizziness  . Dyslipidemia (high LDL; low HDL)  . LBBB (left bundle branch block)  . Hx of CABG    History  Smoking status  . Never Smoker   Smokeless tobacco  . Not on file    History  Alcohol Use No    Family History  Problem Relation Age of Onset  . Heart attack Mother   . Addison's disease Father   . Heart attack Sister     Review of Systems: Constitutional: no fever chills diaphoresis or fatigue or change in weight.  Head and neck: no hearing loss, no epistaxis, no photophobia or visual disturbance. Respiratory: No cough, shortness of breath or wheezing. Cardiovascular: No chest pain peripheral edema, palpitations. Gastrointestinal: No abdominal distention, no abdominal pain, no change in bowel habits hematochezia or melena. Genitourinary: No dysuria, no frequency, no urgency, no nocturia. Musculoskeletal:No arthralgias, no back pain, no gait disturbance or myalgias. Neurological: No dizziness, no headaches, no  numbness, no seizures, no syncope, no weakness, no tremors. Hematologic: No lymphadenopathy, no easy bruising. Psychiatric: No confusion, no hallucinations, no sleep disturbance.    Physical Exam: Filed Vitals:   07/28/11 0922  BP: 120/70  Pulse: 60   general appearance reveals a well-developed elderly gentleman in no distress.  He has a right foot drop.Pupils equal and reactive.   Extraocular Movements are full.  There is no scleral icterus.  The mouth and pharynx are normal.  The neck is supple.  The carotids reveal no bruits.  The jugular venous pressure is normal.  The thyroid is not enlarged.  There is no lymphadenopathy.  The chest is clear to percussion and auscultation. There are no rales or rhonchi. Expansion of the chest is symmetrical.  The precordium is quiet.  The first heart sound is normal.  The second heart sound is physiologically split.  There is no murmur gallop rub or click.  There is no abnormal lift or heave.  The abdomen is soft and nontender. Bowel sounds are normal. The liver and spleen are not enlarged. There Are no abdominal masses. There are no bruits.  The pedal pulses are good.  There is no phlebitis or edema.  There is no cyanosis or clubbing.  His right ankle is swollen from a recent sprain suffered a recent fall. Neurologic is unremarkable except for right foot drop.The skin is warm and dry.  There is no rash.     Assessment / Plan: Continue same cardiac meds.  Recheck in 4 months for followup office visit lipid panel and chemistries.  His lipids this time are not as good but he has been inactive because of his neurosurgical problems.

## 2011-07-28 NOTE — Assessment & Plan Note (Signed)
The patient has had severe back pain for the past several months.  He has had 3 epidural injections which have been ineffective.  He has developed a right foot drop.  He is scheduled to see a neurosurgeon Dr. Dutch Quint next week.  The cardiac standpoint he has been stable for a possible surgery.

## 2011-08-05 ENCOUNTER — Encounter (HOSPITAL_COMMUNITY): Payer: Self-pay

## 2011-08-09 ENCOUNTER — Ambulatory Visit (HOSPITAL_COMMUNITY)
Admission: RE | Admit: 2011-08-09 | Discharge: 2011-08-09 | Disposition: A | Discharge status: Home / Self Care | Payer: Medicare Other | Source: Ambulatory Visit | Attending: Anesthesiology | Admitting: Anesthesiology

## 2011-08-09 ENCOUNTER — Encounter (HOSPITAL_COMMUNITY): Payer: Self-pay

## 2011-08-09 ENCOUNTER — Encounter (HOSPITAL_COMMUNITY)
Admission: RE | Admit: 2011-08-09 | Discharge: 2011-08-09 | Disposition: A | Discharge status: Home / Self Care | Payer: Medicare Other | Source: Ambulatory Visit | Attending: Neurosurgery | Admitting: Neurosurgery

## 2011-08-09 HISTORY — DX: Inflammatory liver disease, unspecified: K75.9

## 2011-08-09 HISTORY — DX: Atherosclerotic heart disease of native coronary artery without angina pectoris: I25.10

## 2011-08-09 HISTORY — DX: Unspecified osteoarthritis, unspecified site: M19.90

## 2011-08-09 HISTORY — DX: Essential (primary) hypertension: I10

## 2011-08-09 HISTORY — DX: Depression, unspecified: F32.A

## 2011-08-09 HISTORY — DX: Diaphragmatic hernia without obstruction or gangrene: K44.9

## 2011-08-09 HISTORY — DX: Major depressive disorder, single episode, unspecified: F32.9

## 2011-08-09 HISTORY — DX: Peripheral vascular disease, unspecified: I73.9

## 2011-08-09 HISTORY — DX: Pneumonia, unspecified organism: J18.9

## 2011-08-09 HISTORY — DX: Gastro-esophageal reflux disease without esophagitis: K21.9

## 2011-08-09 LAB — COMPREHENSIVE METABOLIC PANEL
ALT: 14 U/L (ref 0–53)
AST: 25 U/L (ref 0–37)
Albumin: 3.4 g/dL — ABNORMAL LOW (ref 3.5–5.2)
Alkaline Phosphatase: 76 U/L (ref 39–117)
BUN: 15 mg/dL (ref 6–23)
Chloride: 106 mEq/L (ref 96–112)
Potassium: 4.2 mEq/L (ref 3.5–5.1)
Sodium: 141 mEq/L (ref 135–145)
Total Bilirubin: 0.3 mg/dL (ref 0.3–1.2)

## 2011-08-09 LAB — CBC
MCH: 33.6 pg (ref 26.0–34.0)
MCHC: 33.8 g/dL (ref 30.0–36.0)
MCV: 99.5 fL (ref 78.0–100.0)
Platelets: 228 10*3/uL (ref 150–400)
RDW: 12.5 % (ref 11.5–15.5)
WBC: 5.6 10*3/uL (ref 4.0–10.5)

## 2011-08-09 NOTE — Pre-Procedure Instructions (Signed)
20 [name]  08/09/2011   Your procedure is scheduled on:  08/10/11  Report to [facility] at 10:30 AM.  Call this number if you have problems the morning of surgery: [phone]   Remember:   Do not eat food:After Midnight.  May have clear liquids: up to 4 Hours before arrival.  Clear liquids include soda, tea, black coffee, apple or grape juice, broth.  Take these medicines the morning of surgery with A SIP OF WATER: metoprolol, carbatrol, proscar, zantac, zoloft   Do not wear jewelry, make-up or nail polish.  Do not wear lotions, powders, or perfumes. You may wear deodorant.  Do not shave 48 hours prior to surgery.  Do not bring valuables to the hospital.  Contacts, dentures or bridgework may not be worn into surgery.  Leave suitcase in the car. After surgery it may be brought to your room.  For patients admitted to the hospital, checkout time is 11:00 AM the day of discharge.   Patients discharged the day of surgery will not be allowed to drive home.  Name and phone number of your driver: [name]  Special Instructions: CHG Shower Use Special Wash: 1/2 bottle night before surgery and 1/2 bottle morning of surgery.   Please read over the following fact sheets that you were given: Pain Booklet, Coughing and Deep Breathing, MRSA Information and Surgical Site Infection Prevention

## 2011-08-09 NOTE — Progress Notes (Signed)
Call to Dr.Pool's office for orders, Erie Noe is aware that this chart  Still needs orders

## 2011-08-10 ENCOUNTER — Ambulatory Visit (HOSPITAL_COMMUNITY)
Admission: RE | Admit: 2011-08-10 | Discharge: 2011-08-11 | Disposition: A | Discharge status: Home / Self Care | Payer: Medicare Other | Source: Ambulatory Visit | Attending: Neurosurgery | Admitting: Neurosurgery

## 2011-08-10 ENCOUNTER — Inpatient Hospital Stay (HOSPITAL_COMMUNITY): Payer: Medicare Other

## 2011-08-10 ENCOUNTER — Encounter (HOSPITAL_COMMUNITY): Payer: Self-pay | Admitting: Anesthesiology

## 2011-08-10 ENCOUNTER — Encounter (HOSPITAL_COMMUNITY): Payer: Self-pay | Admitting: Surgery

## 2011-08-10 ENCOUNTER — Inpatient Hospital Stay (HOSPITAL_COMMUNITY): Payer: Medicare Other | Admitting: Anesthesiology

## 2011-08-10 ENCOUNTER — Encounter (HOSPITAL_COMMUNITY)
Admission: RE | Disposition: A | Discharge status: Home / Self Care | Payer: Self-pay | Source: Ambulatory Visit | Attending: Neurosurgery

## 2011-08-10 DIAGNOSIS — Z01812 Encounter for preprocedural laboratory examination: Secondary | ICD-10-CM | POA: Insufficient documentation

## 2011-08-10 DIAGNOSIS — Z01818 Encounter for other preprocedural examination: Secondary | ICD-10-CM | POA: Insufficient documentation

## 2011-08-10 DIAGNOSIS — K219 Gastro-esophageal reflux disease without esophagitis: Secondary | ICD-10-CM | POA: Insufficient documentation

## 2011-08-10 DIAGNOSIS — M5126 Other intervertebral disc displacement, lumbar region: Principal | ICD-10-CM | POA: Insufficient documentation

## 2011-08-10 DIAGNOSIS — M216X9 Other acquired deformities of unspecified foot: Secondary | ICD-10-CM | POA: Insufficient documentation

## 2011-08-10 DIAGNOSIS — K759 Inflammatory liver disease, unspecified: Secondary | ICD-10-CM | POA: Insufficient documentation

## 2011-08-10 DIAGNOSIS — J4489 Other specified chronic obstructive pulmonary disease: Secondary | ICD-10-CM | POA: Insufficient documentation

## 2011-08-10 DIAGNOSIS — I209 Angina pectoris, unspecified: Secondary | ICD-10-CM | POA: Insufficient documentation

## 2011-08-10 DIAGNOSIS — M47817 Spondylosis without myelopathy or radiculopathy, lumbosacral region: Secondary | ICD-10-CM | POA: Insufficient documentation

## 2011-08-10 DIAGNOSIS — F329 Major depressive disorder, single episode, unspecified: Secondary | ICD-10-CM | POA: Insufficient documentation

## 2011-08-10 DIAGNOSIS — I252 Old myocardial infarction: Secondary | ICD-10-CM | POA: Insufficient documentation

## 2011-08-10 DIAGNOSIS — Q762 Congenital spondylolisthesis: Secondary | ICD-10-CM | POA: Insufficient documentation

## 2011-08-10 DIAGNOSIS — I509 Heart failure, unspecified: Secondary | ICD-10-CM | POA: Insufficient documentation

## 2011-08-10 DIAGNOSIS — K449 Diaphragmatic hernia without obstruction or gangrene: Secondary | ICD-10-CM | POA: Insufficient documentation

## 2011-08-10 DIAGNOSIS — J449 Chronic obstructive pulmonary disease, unspecified: Secondary | ICD-10-CM | POA: Insufficient documentation

## 2011-08-10 DIAGNOSIS — M5116 Intervertebral disc disorders with radiculopathy, lumbar region: Secondary | ICD-10-CM | POA: Diagnosis present

## 2011-08-10 DIAGNOSIS — I499 Cardiac arrhythmia, unspecified: Secondary | ICD-10-CM | POA: Insufficient documentation

## 2011-08-10 DIAGNOSIS — G4700 Insomnia, unspecified: Secondary | ICD-10-CM

## 2011-08-10 DIAGNOSIS — R0602 Shortness of breath: Secondary | ICD-10-CM | POA: Insufficient documentation

## 2011-08-10 DIAGNOSIS — I2510 Atherosclerotic heart disease of native coronary artery without angina pectoris: Secondary | ICD-10-CM | POA: Insufficient documentation

## 2011-08-10 DIAGNOSIS — I10 Essential (primary) hypertension: Secondary | ICD-10-CM | POA: Insufficient documentation

## 2011-08-10 DIAGNOSIS — F3289 Other specified depressive episodes: Secondary | ICD-10-CM | POA: Insufficient documentation

## 2011-08-10 HISTORY — PX: LUMBAR LAMINECTOMY/DECOMPRESSION MICRODISCECTOMY: SHX5026

## 2011-08-10 LAB — CBC
HCT: 36.8 % — ABNORMAL LOW (ref 39.0–52.0)
Hemoglobin: 12.7 g/dL — ABNORMAL LOW (ref 13.0–17.0)
MCH: 34 pg (ref 26.0–34.0)
MCHC: 34.5 g/dL (ref 30.0–36.0)
RBC: 3.73 MIL/uL — ABNORMAL LOW (ref 4.22–5.81)

## 2011-08-10 LAB — DIFFERENTIAL
Basophils Relative: 1 % (ref 0–1)
Eosinophils Absolute: 0.3 10*3/uL (ref 0.0–0.7)
Eosinophils Relative: 6 % — ABNORMAL HIGH (ref 0–5)
Lymphs Abs: 1.2 10*3/uL (ref 0.7–4.0)
Monocytes Absolute: 0.5 10*3/uL (ref 0.1–1.0)
Monocytes Relative: 10 % (ref 3–12)

## 2011-08-10 SURGERY — LUMBAR LAMINECTOMY/DECOMPRESSION MICRODISCECTOMY
Anesthesia: General | Site: Back | Laterality: Right | Wound class: Clean

## 2011-08-10 MED ORDER — POTASSIUM CHLORIDE CRYS ER 20 MEQ PO TBCR
20.0000 meq | EXTENDED_RELEASE_TABLET | ORAL | Status: DC
  Administered 2011-08-11: 20 meq via ORAL
  Filled 2011-08-10: qty 1

## 2011-08-10 MED ORDER — DEXAMETHASONE SODIUM PHOSPHATE 10 MG/ML IJ SOLN
INTRAMUSCULAR | Status: DC | PRN
  Administered 2011-08-10: 10 mg via INTRAVENOUS

## 2011-08-10 MED ORDER — SODIUM CHLORIDE 0.9 % IR SOLN
Status: DC | PRN
  Administered 2011-08-10: 14:00:00

## 2011-08-10 MED ORDER — PHENYLEPHRINE HCL 10 MG/ML IJ SOLN
10.0000 mg | INTRAVENOUS | Status: DC | PRN
  Administered 2011-08-10: 20 ug/min via INTRAVENOUS

## 2011-08-10 MED ORDER — ROCURONIUM BROMIDE 100 MG/10ML IV SOLN
INTRAVENOUS | Status: DC | PRN
  Administered 2011-08-10: 35 mg via INTRAVENOUS

## 2011-08-10 MED ORDER — CEFAZOLIN SODIUM 1-5 GM-% IV SOLN
1.0000 g | Freq: Three times a day (TID) | INTRAVENOUS | Status: AC
  Administered 2011-08-10 – 2011-08-11 (×2): 1 g via INTRAVENOUS
  Filled 2011-08-10 (×2): qty 50

## 2011-08-10 MED ORDER — ZOLPIDEM TARTRATE 5 MG PO TABS
5.0000 mg | ORAL_TABLET | Freq: Every evening | ORAL | Status: DC | PRN
  Administered 2011-08-10: 5 mg via ORAL
  Filled 2011-08-10: qty 1

## 2011-08-10 MED ORDER — THROMBIN 5000 UNITS EX KIT
PACK | CUTANEOUS | Status: DC | PRN
  Administered 2011-08-10 (×2): 5000 [IU] via TOPICAL

## 2011-08-10 MED ORDER — HYDROMORPHONE HCL PF 1 MG/ML IJ SOLN: 0.5000 mg | INTRAMUSCULAR | Status: DC | PRN

## 2011-08-10 MED ORDER — FAMOTIDINE 20 MG PO TABS
20.0000 mg | ORAL_TABLET | Freq: Every day | ORAL | Status: DC
  Administered 2011-08-11: 20 mg via ORAL
  Filled 2011-08-10 (×2): qty 1

## 2011-08-10 MED ORDER — ACETAMINOPHEN 325 MG PO TABS: 650.0000 mg | ORAL_TABLET | ORAL | Status: DC | PRN

## 2011-08-10 MED ORDER — PROMETHAZINE HCL 25 MG/ML IJ SOLN: 6.2500 mg | INTRAMUSCULAR | Status: DC | PRN

## 2011-08-10 MED ORDER — FINASTERIDE 5 MG PO TABS
5.0000 mg | ORAL_TABLET | Freq: Every day | ORAL | Status: DC
  Administered 2011-08-11: 5 mg via ORAL
  Filled 2011-08-10 (×2): qty 1

## 2011-08-10 MED ORDER — TEMAZEPAM 15 MG PO CAPS: 15.0000 mg | ORAL_CAPSULE | Freq: Every evening | ORAL | Status: DC | PRN

## 2011-08-10 MED ORDER — PROPOFOL 10 MG/ML IV EMUL
INTRAVENOUS | Status: DC | PRN
  Administered 2011-08-10: 90 mg via INTRAVENOUS

## 2011-08-10 MED ORDER — BUPIVACAINE HCL (PF) 0.25 % IJ SOLN
INTRAMUSCULAR | Status: DC | PRN
  Administered 2011-08-10: 20 mL

## 2011-08-10 MED ORDER — ISOSORBIDE MONONITRATE ER 60 MG PO TB24
60.0000 mg | ORAL_TABLET | Freq: Every day | ORAL | Status: DC
  Administered 2011-08-10 – 2011-08-11 (×2): 60 mg via ORAL
  Filled 2011-08-10 (×2): qty 1

## 2011-08-10 MED ORDER — SERTRALINE HCL 50 MG PO TABS
150.0000 mg | ORAL_TABLET | Freq: Every day | ORAL | Status: DC
  Administered 2011-08-11: 150 mg via ORAL
  Filled 2011-08-10 (×2): qty 1

## 2011-08-10 MED ORDER — PHENOL 1.4 % MT LIQD: 1.0000 | OROMUCOSAL | Status: DC | PRN

## 2011-08-10 MED ORDER — ALUM & MAG HYDROXIDE-SIMETH 400-400-40 MG/5ML PO SUSP
30.0000 mL | Freq: Four times a day (QID) | ORAL | Status: DC | PRN
  Filled 2011-08-10: qty 30

## 2011-08-10 MED ORDER — BACITRACIN 50000 UNITS IM SOLR
INTRAMUSCULAR | Status: AC
  Filled 2011-08-10: qty 50000

## 2011-08-10 MED ORDER — SODIUM CHLORIDE 0.9 % IJ SOLN: 3.0000 mL | INTRAMUSCULAR | Status: DC | PRN

## 2011-08-10 MED ORDER — OXYCODONE-ACETAMINOPHEN 5-325 MG PO TABS: 1.0000 | ORAL_TABLET | ORAL | Status: DC | PRN

## 2011-08-10 MED ORDER — MENTHOL 3 MG MT LOZG: 1.0000 | LOZENGE | OROMUCOSAL | Status: DC | PRN

## 2011-08-10 MED ORDER — SODIUM CHLORIDE 0.9 % IJ SOLN
3.0000 mL | Freq: Two times a day (BID) | INTRAMUSCULAR | Status: DC
  Administered 2011-08-10 – 2011-08-11 (×2): 3 mL via INTRAVENOUS

## 2011-08-10 MED ORDER — HYDROMORPHONE HCL PF 1 MG/ML IJ SOLN: 0.2500 mg | INTRAMUSCULAR | Status: DC | PRN

## 2011-08-10 MED ORDER — KETOROLAC TROMETHAMINE 15 MG/ML IJ SOLN
INTRAMUSCULAR | Status: DC | PRN
  Administered 2011-08-10: 15 mg via INTRAVENOUS

## 2011-08-10 MED ORDER — CARBAMAZEPINE ER 100 MG PO TB12
300.0000 mg | ORAL_TABLET | Freq: Two times a day (BID) | ORAL | Status: DC
  Administered 2011-08-10 – 2011-08-11 (×2): 300 mg via ORAL
  Filled 2011-08-10 (×3): qty 3

## 2011-08-10 MED ORDER — NEOSTIGMINE METHYLSULFATE 1 MG/ML IJ SOLN
INTRAMUSCULAR | Status: DC | PRN
  Administered 2011-08-10: 3 mg via INTRAVENOUS

## 2011-08-10 MED ORDER — DIPHENHYDRAMINE HCL 50 MG/ML IJ SOLN
INTRAMUSCULAR | Status: AC
  Filled 2011-08-10: qty 1

## 2011-08-10 MED ORDER — CEFAZOLIN SODIUM 1-5 GM-% IV SOLN
1.0000 g | INTRAVENOUS | Status: AC
  Administered 2011-08-10: 1 g via INTRAVENOUS

## 2011-08-10 MED ORDER — FENTANYL CITRATE 0.05 MG/ML IJ SOLN
INTRAMUSCULAR | Status: DC | PRN
  Administered 2011-08-10: 100 ug via INTRAVENOUS

## 2011-08-10 MED ORDER — NITROGLYCERIN 0.4 MG SL SUBL: 0.4000 mg | SUBLINGUAL_TABLET | SUBLINGUAL | Status: DC | PRN

## 2011-08-10 MED ORDER — HYDROCODONE-ACETAMINOPHEN 5-325 MG PO TABS
1.0000 | ORAL_TABLET | ORAL | Status: DC | PRN
  Administered 2011-08-11: 2 via ORAL
  Filled 2011-08-10: qty 2

## 2011-08-10 MED ORDER — MEPERIDINE HCL 25 MG/ML IJ SOLN: 6.2500 mg | INTRAMUSCULAR | Status: DC | PRN

## 2011-08-10 MED ORDER — PHENYLEPHRINE HCL 10 MG/ML IJ SOLN
INTRAMUSCULAR | Status: DC | PRN
  Administered 2011-08-10 (×3): 40 ug via INTRAVENOUS

## 2011-08-10 MED ORDER — CLOPIDOGREL BISULFATE 75 MG PO TABS
75.0000 mg | ORAL_TABLET | Freq: Every day | ORAL | Status: DC
  Administered 2011-08-10 – 2011-08-11 (×2): 75 mg via ORAL
  Filled 2011-08-10 (×2): qty 1

## 2011-08-10 MED ORDER — CEFAZOLIN SODIUM 1-5 GM-% IV SOLN
INTRAVENOUS | Status: AC
  Filled 2011-08-10: qty 50

## 2011-08-10 MED ORDER — HYDROXYZINE HCL 50 MG/ML IM SOLN: 50.0000 mg | INTRAMUSCULAR | Status: DC | PRN

## 2011-08-10 MED ORDER — HEMOSTATIC AGENTS (NO CHARGE) OPTIME
TOPICAL | Status: DC | PRN
  Administered 2011-08-10: 1 via TOPICAL

## 2011-08-10 MED ORDER — CYCLOBENZAPRINE HCL 10 MG PO TABS: 10.0000 mg | ORAL_TABLET | Freq: Three times a day (TID) | ORAL | Status: DC | PRN

## 2011-08-10 MED ORDER — GLYCOPYRROLATE 0.2 MG/ML IJ SOLN
INTRAMUSCULAR | Status: DC | PRN
  Administered 2011-08-10: .6 mg via INTRAVENOUS

## 2011-08-10 MED ORDER — CALCIUM CARBONATE-VITAMIN D 500-200 MG-UNIT PO TABS
1.0000 | ORAL_TABLET | Freq: Two times a day (BID) | ORAL | Status: DC
  Administered 2011-08-10 – 2011-08-11 (×2): 1 via ORAL
  Filled 2011-08-10 (×3): qty 1

## 2011-08-10 MED ORDER — ROSUVASTATIN CALCIUM 20 MG PO TABS
20.0000 mg | ORAL_TABLET | Freq: Every day | ORAL | Status: DC
  Administered 2011-08-10 – 2011-08-11 (×2): 20 mg via ORAL
  Filled 2011-08-10 (×2): qty 1

## 2011-08-10 MED ORDER — EPHEDRINE SULFATE 50 MG/ML IJ SOLN
INTRAMUSCULAR | Status: DC | PRN
  Administered 2011-08-10: 5 mg via INTRAVENOUS

## 2011-08-10 MED ORDER — ASPIRIN 325 MG PO TABS
325.0000 mg | ORAL_TABLET | Freq: Every day | ORAL | Status: DC
  Administered 2011-08-10 – 2011-08-11 (×2): 325 mg via ORAL
  Filled 2011-08-10 (×2): qty 1

## 2011-08-10 MED ORDER — FUROSEMIDE 20 MG PO TABS
20.0000 mg | ORAL_TABLET | ORAL | Status: DC
  Administered 2011-08-10: 20 mg via ORAL
  Filled 2011-08-10: qty 1

## 2011-08-10 MED ORDER — METOPROLOL SUCCINATE ER 50 MG PO TB24
50.0000 mg | ORAL_TABLET | Freq: Every day | ORAL | Status: DC
  Administered 2011-08-11: 50 mg via ORAL
  Filled 2011-08-10 (×2): qty 1

## 2011-08-10 MED ORDER — SODIUM CHLORIDE 0.9 % IV SOLN: 250.0000 mL | INTRAVENOUS | Status: DC

## 2011-08-10 MED ORDER — SODIUM CHLORIDE 0.9 % IR SOLN
Status: DC | PRN
  Administered 2011-08-10: 1000 mL

## 2011-08-10 MED ORDER — ALBUTEROL SULFATE HFA 108 (90 BASE) MCG/ACT IN AERS
2.0000 | INHALATION_SPRAY | Freq: Four times a day (QID) | RESPIRATORY_TRACT | Status: DC | PRN
  Filled 2011-08-10: qty 6.7

## 2011-08-10 MED ORDER — LACTATED RINGERS IV SOLN
INTRAVENOUS | Status: DC | PRN
  Administered 2011-08-10 (×2): via INTRAVENOUS

## 2011-08-10 MED ORDER — SODIUM CHLORIDE 0.9 % IV SOLN
INTRAVENOUS | Status: AC
  Filled 2011-08-10: qty 500

## 2011-08-10 MED ORDER — ACETAMINOPHEN 650 MG RE SUPP: 650.0000 mg | RECTAL | Status: DC | PRN

## 2011-08-10 MED ORDER — HYDROXYZINE HCL 25 MG PO TABS: 50.0000 mg | ORAL_TABLET | ORAL | Status: DC | PRN

## 2011-08-10 SURGICAL SUPPLY — 56 items
ADH SKN CLS APL DERMABOND .7 (GAUZE/BANDAGES/DRESSINGS) ×1
APL SKNCLS STERI-STRIP NONHPOA (GAUZE/BANDAGES/DRESSINGS) ×1
BAG DECANTER FOR FLEXI CONT (MISCELLANEOUS) ×2 IMPLANT
BENZOIN TINCTURE PRP APPL 2/3 (GAUZE/BANDAGES/DRESSINGS) ×2 IMPLANT
BLADE SURG ROTATE 9660 (MISCELLANEOUS) IMPLANT
BRUSH SCRUB EZ PLAIN DRY (MISCELLANEOUS) ×2 IMPLANT
BUR CUTTER 7.0 ROUND (BURR) ×2 IMPLANT
BUR MATCHSTICK NEURO 3.0 LAGG (BURR) ×1 IMPLANT
CANISTER SUCTION 2500CC (MISCELLANEOUS) ×2 IMPLANT
CLOTH BEACON ORANGE TIMEOUT ST (SAFETY) ×2 IMPLANT
CONT SPEC 4OZ CLIKSEAL STRL BL (MISCELLANEOUS) ×2 IMPLANT
DECANTER SPIKE VIAL GLASS SM (MISCELLANEOUS) ×2 IMPLANT
DERMABOND ADVANCED (GAUZE/BANDAGES/DRESSINGS) ×1
DERMABOND ADVANCED .7 DNX12 (GAUZE/BANDAGES/DRESSINGS) ×1 IMPLANT
DRAPE LAPAROTOMY 100X72X124 (DRAPES) ×2 IMPLANT
DRAPE MICROSCOPE ZEISS OPMI (DRAPES) ×2 IMPLANT
DRAPE POUCH INSTRU U-SHP 10X18 (DRAPES) ×2 IMPLANT
DRAPE PROXIMA HALF (DRAPES) IMPLANT
DRAPE SURG 17X23 STRL (DRAPES) ×4 IMPLANT
ELECT REM PT RETURN 9FT ADLT (ELECTROSURGICAL) ×2
ELECTRODE REM PT RTRN 9FT ADLT (ELECTROSURGICAL) ×1 IMPLANT
GAUZE SPONGE 4X4 12PLY STRL LF (GAUZE/BANDAGES/DRESSINGS) ×1 IMPLANT
GAUZE SPONGE 4X4 16PLY XRAY LF (GAUZE/BANDAGES/DRESSINGS) IMPLANT
GLOVE BIOGEL PI IND STRL 7.0 (GLOVE) IMPLANT
GLOVE BIOGEL PI INDICATOR 7.0 (GLOVE) ×1
GLOVE ECLIPSE 7.5 STRL STRAW (GLOVE) ×1 IMPLANT
GLOVE ECLIPSE 8.5 STRL (GLOVE) ×2 IMPLANT
GLOVE EXAM NITRILE LRG STRL (GLOVE) IMPLANT
GLOVE EXAM NITRILE MD LF STRL (GLOVE) ×1 IMPLANT
GLOVE EXAM NITRILE XL STR (GLOVE) IMPLANT
GLOVE EXAM NITRILE XS STR PU (GLOVE) IMPLANT
GLOVE SS BIOGEL STRL SZ 6.5 (GLOVE) IMPLANT
GLOVE SUPERSENSE BIOGEL SZ 6.5 (GLOVE) ×1
GLOVE SURG SS PI 6.5 STRL IVOR (GLOVE) ×2 IMPLANT
GOWN BRE IMP SLV AUR LG STRL (GOWN DISPOSABLE) ×1 IMPLANT
GOWN BRE IMP SLV AUR XL STRL (GOWN DISPOSABLE) ×3 IMPLANT
GOWN STRL REIN 2XL LVL4 (GOWN DISPOSABLE) IMPLANT
KIT BASIN OR (CUSTOM PROCEDURE TRAY) ×2 IMPLANT
KIT ROOM TURNOVER OR (KITS) ×2 IMPLANT
NDL SPNL 22GX3.5 QUINCKE BK (NEEDLE) ×1 IMPLANT
NEEDLE HYPO 22GX1.5 SAFETY (NEEDLE) ×2 IMPLANT
NEEDLE SPNL 22GX3.5 QUINCKE BK (NEEDLE) ×2 IMPLANT
NS IRRIG 1000ML POUR BTL (IV SOLUTION) ×2 IMPLANT
PACK LAMINECTOMY NEURO (CUSTOM PROCEDURE TRAY) ×2 IMPLANT
PAD ARMBOARD 7.5X6 YLW CONV (MISCELLANEOUS) ×6 IMPLANT
RUBBERBAND STERILE (MISCELLANEOUS) ×4 IMPLANT
SPONGE GAUZE 4X4 12PLY (GAUZE/BANDAGES/DRESSINGS) ×2 IMPLANT
SPONGE SURGIFOAM ABS GEL SZ50 (HEMOSTASIS) ×2 IMPLANT
STRIP CLOSURE SKIN 1/2X4 (GAUZE/BANDAGES/DRESSINGS) ×2 IMPLANT
SUT VIC AB 2-0 CT1 18 (SUTURE) ×2 IMPLANT
SUT VIC AB 3-0 SH 8-18 (SUTURE) ×2 IMPLANT
SYR 20ML ECCENTRIC (SYRINGE) ×2 IMPLANT
TAPE HYPAFIX 4 X10 (GAUZE/BANDAGES/DRESSINGS) ×1 IMPLANT
TOWEL OR 17X24 6PK STRL BLUE (TOWEL DISPOSABLE) ×2 IMPLANT
TOWEL OR 17X26 10 PK STRL BLUE (TOWEL DISPOSABLE) ×2 IMPLANT
WATER STERILE IRR 1000ML POUR (IV SOLUTION) ×2 IMPLANT

## 2011-08-10 NOTE — Progress Notes (Signed)
Pt reports difficulty urinating at times due to enlarged prostate-normally has to catherized.//L. Leone Mobley,rn

## 2011-08-10 NOTE — Brief Op Note (Signed)
08/10/2011  3:16 PM  PATIENT:  [name]  77 y.o. male  PRE-OPERATIVE DIAGNOSIS:  Right lumbar five-sacral one herniated nucleus pulposus RIGHT SIDE  POST-OPERATIVE DIAGNOSIS:  Right lumbar five-sacral one herniated nucleus pulposus   PROCEDURE:  Procedure(s): LUMBAR LAMINECTOMY/DECOMPRESSION MICRODISCECTOMY  SURGEON:  Surgeon(s): Sherilyn Cooter A Jarius Dieudonne Randy O Kritzer  PHYSICIAN ASSISTANT:   ASSISTANTS: Kritzer   ANESTHESIA:   general  EBL:  Total I/O In: 1000 [I.V.:1000] Out: 100 [Blood:100]  BLOOD ADMINISTERED:none  DRAINS: none   LOCAL MEDICATIONS USED:  MARCAINE 20CC  SPECIMEN:  No Specimen  DISPOSITION OF SPECIMEN:  N/A  COUNTS:  YES  TOURNIQUET:  * No tourniquets in log *  DICTATION: .Dragon Dictation  PLAN OF CARE: Admit for overnight observation  PATIENT DISPOSITION:  PACU - hemodynamically stable.   Delay start of Pharmacological VTE agent (>24hrs) due to surgical blood loss or risk of bleeding:  Yes

## 2011-08-10 NOTE — Transfer of Care (Signed)
Immediate Anesthesia Transfer of Care Note  Patient: [name]  Procedure(s) Performed:  LUMBAR LAMINECTOMY/DECOMPRESSION MICRODISCECTOMY - RIGHT Lumbar five-sacral one LAMINECTOMY, MICRODISCECTOMY  Patient Location: PACU  Anesthesia Type: General  Level of Consciousness: awake, alert  and oriented  Airway & Oxygen Therapy: Patient Spontanous Breathing  Post-op Assessment: Report given to PACU RN, Post -op Vital signs reviewed and stable and Patient moving all extremities  Post vital signs: Reviewed and stable  Complications: No apparent anesthesia complications

## 2011-08-10 NOTE — Op Note (Signed)
Date of procedure: 08/10/2011  Date of dictation: Same  Service: Neurosurgery  Preoperative diagnosis: Right L5-S1 herniated nucleus pulposus with radiculopathy.  Postoperative diagnosis: Same  Procedure Name: Right L5-S1 laminotomy and microdiscectomy.   Surgeon:Larry Alcock A.Kirstin Kugler, M.D.  Asst. Surgeon: Gerlene Fee  Anesthesia: General  Indication: 78 year old male with right lower chamois pain and a complete right-sided foot drop. 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 Patient has minimal back pain. He has no symptoms of instability. Plan is for simple decompressive surgery in hopes of improving his situation.  Operative note: Patient taken operating placed on table in the supine position. After adequate level of anesthesia achieved patient is positioned prone on the Wilson frame appropriate padded. Patient lumbar regions prepped draped sterilely. 10 blade used to make a linear skin incision overlying the L5-S1 interspace. This carried down sharply in the midline. Subperiosteal section of the form of the right side was the lamina facet joints of L5 and S1. Deep self-retaining retractors placed intraoperative x-rays taken level was confirmed. Right-sided hemilaminectomy of L5 was then performed using high-speed drill and Kerrison rongeurs to remove the hemilamina of L5 and the medial aspect the L5-S1 facet joint. Overgrown tissue within the pars interarticularis defect was resected. Underlying thecal sac and exiting right L5 and S1 nerve roots were decompressed. Microscope brought field these might resection of the right-sided L5 nerve root and underlying disc.[name] Disc space was identified. A superior disc herniation was dissected free and removed using pituitary rongeurs. All meds of the compression upon the exiting L5 nerve root were removed. This point very thorough  discectomy and foraminotomy been achieved. There is no varus of injury to thecal sac and nerve roots. Wound is then copiously. Out solution. Gelfoam postoperative hemostasis the needed. Microscope and retractor system were removed. Hemostasis of the muscle was achieved with electric cautery. Wounds and close in layers. Steri-Strips and sterile dressing were applied. There were no apparent locations. Patient tolerated the procedure well and returned to recovery postop.

## 2011-08-10 NOTE — Anesthesia Postprocedure Evaluation (Signed)
  Anesthesia Post-op Note  Patient: [name]  Procedure(s) Performed:  LUMBAR LAMINECTOMY/DECOMPRESSION MICRODISCECTOMY - RIGHT Lumbar five-sacral one LAMINECTOMY, MICRODISCECTOMY  Patient Location: PACU and Short Stay  Anesthesia Type: General  Level of Consciousness: awake  Airway and Oxygen Therapy: Patient Spontanous Breathing  Post-op Pain: mild  Post-op Assessment: Post-op Vital signs reviewed  Post-op Vital Signs: stable  Complications: No apparent anesthesia complications

## 2011-08-10 NOTE — Anesthesia Preprocedure Evaluation (Addendum)
Anesthesia Evaluation  Patient identified by MRN, date of birth, ID band Patient awake    Reviewed: Allergy & Precautions, H&P , NPO status , Patient's Chart, lab work & pertinent test results  History of Anesthesia Complications Negative for: history of anesthetic complications  Airway Mallampati: I  Neck ROM: Full    Dental  (+) Teeth Intact and Dental Advisory Given   Pulmonary shortness of breath, pneumonia , COPD COPD inhaler,  clear to auscultation        Cardiovascular hypertension, Pt. on medications + angina + CAD, + Past MI and +CHF + dysrhythmias + Valvular Problems/Murmurs MR Regular Normal+ Systolic murmurs    Neuro/Psych  Neuromuscular disease    GI/Hepatic hiatal hernia, GERD-  ,(+) Hepatitis -, Unspecified  Endo/Other    Renal/GU      Musculoskeletal   Abdominal   Peds  Hematology   Anesthesia Other Findings   Reproductive/Obstetrics                          Anesthesia Physical Anesthesia Plan  ASA: III  Anesthesia Plan: General   Post-op Pain Management:    Induction: Intravenous  Airway Management Planned: Oral ETT  Additional Equipment:   Intra-op Plan:   Post-operative Plan: Extubation in OR  Informed Consent: I have reviewed the patients History and Physical, chart, labs and discussed the procedure including the risks, benefits and alternatives for the proposed anesthesia with the patient or authorized representative who has indicated his/her understanding and acceptance.   Dental advisory given  Plan Discussed with: CRNA and Surgeon  Anesthesia Plan Comments: (Multiple cardiac interventions, significant cardiac history. Dr Yevonne Pax notes reviewed. Stabe to proceed.)        Anesthesia Quick Evaluation

## 2011-08-10 NOTE — H&P (Signed)
[name] is an 77 y.o. male.   Chief Complaint: Right lower extremity pain and weakness. HPI: 77 year old male patient with right lower chamois pain and weakness consistent with a right-sided L5 radiculopathy. Patient has a near-total footdrop on the right side. He has no left-sided symptoms. He has failed conservative management. Workup demonstrates evidence of a right-sided L5-S1 disc herniation with superior free fragment causing compression of the right-sided L5 nerve root. Patient is admitted for microdiscectomy in hopes of improving his symptoms.  Past Medical History  Diagnosis Date  . Ischemic heart disease   . Myocardial infarction   . CHF (congestive heart failure)   . Hypoxia     chronic  . Angina     when walking  . Dizziness     with low blood pressure  . Dyslipidemia (high LDL; low HDL)   . Hypertension   . Coronary artery disease   . Heart murmur   . Peripheral vascular disease     thrombus- in leg- many yrs. ago- ?R- coumadin x1 mth  . Pneumonia     hosp. long time ago   . Shortness of breath   . COPD (chronic obstructive pulmonary disease)     Dec. 2010- d/c O2(had been using for 2 mths)  . Blood transfusion     post CABG  . Hepatitis     hepatitis- long time ago, occupational contamination   . GERD (gastroesophageal reflux disease)   . Cancer     prostate - Ca- radiation therapy- 2002  . Peripheral neuropathy   . Hiatal hernia   . Arthritis     hnp, lumbar, ankle (foot drop-R), wears a splint, arthritis - "all over", low bone density   . Depression     uses Zoloft     Past Surgical History  Procedure Date  . Coronary artery bypass graft 1986 and 1993  . Ptca 2008  . Cardiac catheterization     2010- last cath, two stents at that time  . Eye surgery     cataracts - bilateral , IOL  . Hernia repair     bilateral inguiinal repair   . Tonsillectomy   . Fracture surgery     heel- crushed -2002,  (hardware)   . Joint replacement     R knee-  arthroscopic, x2, R great toe- 2 surgeries- /w hardware & thenlater fused by Dr. Lajoyce Corners    . Appendectomy   . Rhinoplasty     as a teen, followed by same procedure at 1970's     Family History  Problem Relation Age of Onset  . Heart attack Mother   . Addison's disease Father   . Heart attack Sister   . Anesthesia problems Neg Hx   . Hypotension Neg Hx   . Malignant hyperthermia Neg Hx   . Pseudochol deficiency Neg Hx    Social History:  reports that he has never smoked. He does not have any smokeless tobacco history on file. He reports that he does not drink alcohol or use illicit drugs.  Allergies: No Known Allergies  No current facility-administered medications on file as of 08/10/2011.   Medications Prior to Admission  Medication Sig Dispense Refill  . calcium-vitamin D (OSCAL WITH D 500-200) 500-200 MG-UNIT per tablet Take 1 tablet by mouth 2 (two) times daily.        . clopidogrel (PLAVIX) 75 MG tablet Take 75 mg by mouth daily.        . finasteride (  PROSCAR) 5 MG tablet Take 5 mg by mouth daily.        . isosorbide mononitrate (IMDUR) 60 MG 24 hr tablet TAKE 1 TABLET BY MOUTH EVERY MORNING  30 tablet  6  . metoprolol (TOPROL-XL) 50 MG 24 hr tablet Take 50 mg by mouth daily.        . nitroGLYCERIN (NITROSTAT) 0.4 MG SL tablet Place 1 tablet (0.4 mg total) under the tongue every 5 (five) minutes as needed.  25 tablet  11  . potassium chloride SA (K-DUR,KLOR-CON) 20 MEQ tablet Take 20 mEq by mouth every Monday, Wednesday, and Friday.       . ranitidine (ZANTAC) 150 MG tablet Take 1 tablet (150 mg total) by mouth daily.  90 tablet  3  . rosuvastatin (CRESTOR) 20 MG tablet Take 20 mg by mouth daily.        . sertraline (ZOLOFT) 100 MG tablet Take 150 mg by mouth daily.       . temazepam (RESTORIL) 15 MG capsule Take 1 capsule (15 mg total) by mouth at bedtime as needed for sleep.  30 capsule  5  . aspirin 325 MG tablet Take 325 mg by mouth daily.          Results for orders  placed during the hospital encounter of 08/09/11 (from the past 48 hour(s))  COMPREHENSIVE METABOLIC PANEL     Status: Abnormal   Collection Time   08/09/11  9:06 AM      Component Value Range Comment   Sodium 141  135 - 145 (mEq/L)    Potassium 4.2  3.5 - 5.1 (mEq/L)    Chloride 106  96 - 112 (mEq/L)    CO2 28  19 - 32 (mEq/L)    Glucose, Bld 97  70 - 99 (mg/dL)    BUN 15  6 - 23 (mg/dL)    Creatinine, Ser 4.09  0.50 - 1.35 (mg/dL)    Calcium 9.9  8.4 - 10.5 (mg/dL)    Total Protein 6.9  6.0 - 8.3 (g/dL)    Albumin 3.4 (*) 3.5 - 5.2 (g/dL)    AST 25  0 - 37 (U/L)    ALT 14  0 - 53 (U/L)    Alkaline Phosphatase 76  39 - 117 (U/L)    Total Bilirubin 0.3  0.3 - 1.2 (mg/dL)    GFR calc non Af Amer 80 (*) >90 (mL/min)    GFR calc Af Amer >90  >90 (mL/min)   CBC     Status: Abnormal   Collection Time   08/09/11  9:06 AM      Component Value Range Comment   WBC 5.6  4.0 - 10.5 (K/uL)    RBC 3.90 (*) 4.22 - 5.81 (MIL/uL)    Hemoglobin 13.1  13.0 - 17.0 (g/dL)    HCT 81.1 (*) 91.4 - 52.0 (%)    MCV 99.5  78.0 - 100.0 (fL)    MCH 33.6  26.0 - 34.0 (pg)    MCHC 33.8  30.0 - 36.0 (g/dL)    RDW 78.2  95.6 - 21.3 (%)    Platelets 228  150 - 400 (K/uL)   SURGICAL PCR SCREEN     Status: Normal   Collection Time   08/09/11  9:08 AM      Component Value Range Comment   MRSA, PCR NEGATIVE  NEGATIVE     Staphylococcus aureus NEGATIVE  NEGATIVE     Dg Chest 2 View  08/09/2011  *RADIOLOGY REPORT*  Clinical Data: Preop for lumbar laminectomy.  CHEST - 2 VIEW  Comparison: Chest x-ray 04/17/2009.  Findings: The heart is borderline enlarged but stable.  There is tortuosity and mild calcification of the thoracic aorta.  Stable surgical changes from bypass surgery.  The lungs demonstrate mild chronic bronchitic changes and right basilar scarring.  No acute overlying pulmonary process.  No pleural effusion.  The bony thorax is intact.  IMPRESSION: Chronic lung changes but no acute overlying pulmonary  process.  Original Report Authenticated By: P. Loralie Champagne, M.D.    Review of Systems  All other systems reviewed and are negative.    Blood pressure 155/73, pulse 56, temperature 97.4 F (36.3 C), temperature source Oral, resp. rate 16, SpO2 97.00%. Physical Exam  Constitutional: He is oriented to person, place, and time. He appears well-developed and well-nourished.  HENT:  Head: Normocephalic and atraumatic.  Right Ear: External ear normal.  Left Ear: External ear normal.  Eyes: Conjunctivae and EOM are normal. Pupils are equal, round, and reactive to light.  Neck: Normal range of motion. Neck supple. No tracheal deviation present. No thyromegaly present.  Cardiovascular: Normal rate, regular rhythm, normal heart sounds and intact distal pulses.   Respiratory: Effort normal and breath sounds normal. No stridor. No respiratory distress. He has no wheezes.  GI: Soft. Bowel sounds are normal. He exhibits no distension. There is no tenderness.  Musculoskeletal: Normal range of motion. He exhibits no edema and no tenderness.  Neurological: He is alert and oriented to person, place, and time. He has normal reflexes. He displays normal reflexes. No cranial nerve deficit. He exhibits normal muscle tone. Coordination normal.  Skin: Skin is warm. No rash noted. No erythema. No pallor.  Psychiatric: He has a normal mood and affect. His behavior is normal. Judgment and thought content normal.   motor strength reveals marked weakness of dorsiflexion his right lower chamois. Right anterior tibialis..At 0/5. Right extensor hallucis longus graded out at 0/5. Sensory examination was decreased sensation over light touch in his right L5 dermatome.  Assessment/Plan Right L5-S1 herniated nucleus pulposus with radiculopathy. Situation complicated by long-standing spondylolysis with and early grade 1 L5-S1 spondylolisthesis. Given advanced age and current functionality plan is for simple decompressive  surgery by means of a microdiscectomy in hopes of improving his symptoms. Patient aware the risks and benefits and wishes to proceed.  Dusten Ellinwood A 08/10/2011, 11:14 AM

## 2011-08-11 MED ORDER — HYDROCODONE-ACETAMINOPHEN 5-325 MG PO TABS: 1.0000 | ORAL_TABLET | ORAL | Status: DC | PRN

## 2011-08-11 MED ORDER — CYCLOBENZAPRINE HCL 10 MG PO TABS: 10.0000 mg | ORAL_TABLET | Freq: Three times a day (TID) | ORAL | Status: DC | PRN

## 2011-08-11 NOTE — Discharge Summary (Signed)
Physician Discharge Summary  Patient ID: [name] MRN: [id number] DOB/AGE: Dec 14, 1924 77 y.o.  Admit date: 08/10/2011 Discharge date: 08/11/2011  Admission Diagnoses:  Discharge Diagnoses:  Active Problems:  * No active hospital problems. *    Discharged Condition: good  Hospital Course: Patient is admitted and taken to the operating room where he underwent an uncomplicated right-sided L5-S1 foraminotomies and microdiscectomy. Postoperative patient has done well. His right lower extremity pain is improved. Still has a near-complete right-sided foot drop which is unchanged from preop. He has diminished sensation in his right L5 dermatome otherwise he is neurologically intact. His wound is healing well. No other complaints.  Consults: none  Significant Diagnostic Studies: None  Treatments: surgery: Right L5-S1 laminotomy and microdiscectomy.  Discharge Exam: Blood pressure 116/62, pulse 57, temperature 97.9 F (36.6 C), temperature source Oral, resp. rate 20, SpO2 97.00%. Patient is awake and alert oriented appropriate. Cranial nerve function is intact. Motor examination is intact except his right anterior tibialis graded out at 0-1/5 and his right extensor hallucis longus grade sided 0/5. Sensory examination reveals decreased sensation to pinprick and light touch in his right L5 dermatome. Wound is healing well. Chest and abdominal examination are  unremarkable.  Disposition:   Discharge Orders    Future Appointments: Provider: Department: Dept Phone: Center:   11/02/2011 9:00 AM Lorenda Cahill [facility] [phone] [facility]   11/09/2011 9:45 AM Cassell Clement, MD [facility] [phone] None     Current Discharge Medication List    START taking these medications   Details  cyclobenzaprine (FLEXERIL) 10 MG tablet Take 1 tablet (10 mg total) by mouth 3 (three) times daily as needed for muscle spasms. Qty: 30 tablet, Refills: 1      HYDROcodone-acetaminophen (NORCO) 5-325 MG per tablet Take 1-2 tablets by mouth every 4 (four) hours as needed. Qty: 60 tablet, Refills: 1      CONTINUE these medications which have NOT CHANGED   Details  albuterol (PROVENTIL HFA;VENTOLIN HFA) 108 (90 BASE) MCG/ACT inhaler Inhale 2 puffs into the lungs every 6 (six) hours as needed. For shortness of breath.     calcium-vitamin D (OSCAL WITH D 500-200) 500-200 MG-UNIT per tablet Take 1 tablet by mouth 2 (two) times daily.      carbamazepine (CARBATROL) 300 MG 12 hr capsule Take 300 mg by mouth 2 (two) times daily.      clopidogrel (PLAVIX) 75 MG tablet Take 75 mg by mouth daily.      finasteride (PROSCAR) 5 MG tablet Take 5 mg by mouth daily.      isosorbide mononitrate (IMDUR) 60 MG 24 hr tablet TAKE 1 TABLET BY MOUTH EVERY MORNING Qty: 30 tablet, Refills: 6    metoprolol (TOPROL-XL) 50 MG 24 hr tablet Take 50 mg by mouth daily.      nitroGLYCERIN (NITROSTAT) 0.4 MG SL tablet Place 1 tablet (0.4 mg total) under the tongue every 5 (five) minutes as needed. Qty: 25 tablet, Refills: 11   Associated Diagnoses: Chest pain    potassium chloride SA (K-DUR,KLOR-CON) 20 MEQ tablet Take 20 mEq by mouth every Monday, Wednesday, and Friday.     ranitidine (ZANTAC) 150 MG tablet Take 1 tablet (150 mg total) by mouth daily. Qty: 90 tablet, Refills: 3    rosuvastatin (CRESTOR) 20 MG tablet Take 20 mg by mouth daily.      sertraline (ZOLOFT) 100 MG tablet Take 150 mg by mouth daily.     temazepam (RESTORIL) 15 MG capsule  Take 1 capsule (15 mg total) by mouth at bedtime as needed for sleep. Qty: 30 capsule, Refills: 5   Associated Diagnoses: Insomnia    aspirin 325 MG tablet Take 325 mg by mouth daily.      furosemide (LASIX) 20 MG tablet Take 20 mg by mouth every other day.         Follow-up Information    Follow up with Eleftheria Taborn A. Call in 1 week.   Contact information:   [address] [place]  [address] [phone]          Signed: Temple Pacini 08/11/2011, 8:25 AM

## 2011-08-12 ENCOUNTER — Encounter (HOSPITAL_COMMUNITY): Payer: Self-pay | Admitting: Neurosurgery

## 2011-08-22 ENCOUNTER — Inpatient Hospital Stay (HOSPITAL_COMMUNITY): Payer: Medicare Other | Admitting: Anesthesiology

## 2011-08-22 ENCOUNTER — Other Ambulatory Visit: Payer: Self-pay

## 2011-08-22 ENCOUNTER — Inpatient Hospital Stay (HOSPITAL_COMMUNITY)
Admission: AD | Admit: 2011-08-22 | Discharge: 2011-08-25 | DRG: 030 | Disposition: A | Discharge status: Home / Self Care | Payer: Medicare Other | Source: Ambulatory Visit | Attending: Neurosurgery | Admitting: Neurosurgery

## 2011-08-22 ENCOUNTER — Encounter (HOSPITAL_COMMUNITY)
Admission: AD | Disposition: A | Discharge status: Home / Self Care | Payer: Self-pay | Source: Ambulatory Visit | Attending: Neurological Surgery

## 2011-08-22 ENCOUNTER — Encounter (HOSPITAL_COMMUNITY): Payer: Self-pay | Admitting: Anesthesiology

## 2011-08-22 DIAGNOSIS — Z7902 Long term (current) use of antithrombotics/antiplatelets: Secondary | ICD-10-CM

## 2011-08-22 DIAGNOSIS — I2510 Atherosclerotic heart disease of native coronary artery without angina pectoris: Secondary | ICD-10-CM | POA: Diagnosis present

## 2011-08-22 DIAGNOSIS — Z951 Presence of aortocoronary bypass graft: Secondary | ICD-10-CM

## 2011-08-22 DIAGNOSIS — K219 Gastro-esophageal reflux disease without esophagitis: Secondary | ICD-10-CM | POA: Diagnosis present

## 2011-08-22 DIAGNOSIS — I10 Essential (primary) hypertension: Secondary | ICD-10-CM | POA: Diagnosis present

## 2011-08-22 DIAGNOSIS — F329 Major depressive disorder, single episode, unspecified: Secondary | ICD-10-CM | POA: Diagnosis present

## 2011-08-22 DIAGNOSIS — E785 Hyperlipidemia, unspecified: Secondary | ICD-10-CM | POA: Diagnosis present

## 2011-08-22 DIAGNOSIS — G609 Hereditary and idiopathic neuropathy, unspecified: Secondary | ICD-10-CM | POA: Diagnosis present

## 2011-08-22 DIAGNOSIS — Z7982 Long term (current) use of aspirin: Secondary | ICD-10-CM

## 2011-08-22 DIAGNOSIS — Z96659 Presence of unspecified artificial knee joint: Secondary | ICD-10-CM

## 2011-08-22 DIAGNOSIS — G969 Disorder of central nervous system, unspecified: Principal | ICD-10-CM | POA: Diagnosis present

## 2011-08-22 DIAGNOSIS — Y92009 Unspecified place in unspecified non-institutional (private) residence as the place of occurrence of the external cause: Secondary | ICD-10-CM

## 2011-08-22 DIAGNOSIS — F3289 Other specified depressive episodes: Secondary | ICD-10-CM | POA: Diagnosis present

## 2011-08-22 DIAGNOSIS — J4489 Other specified chronic obstructive pulmonary disease: Secondary | ICD-10-CM | POA: Diagnosis present

## 2011-08-22 DIAGNOSIS — Z9889 Other specified postprocedural states: Secondary | ICD-10-CM

## 2011-08-22 DIAGNOSIS — Z79899 Other long term (current) drug therapy: Secondary | ICD-10-CM

## 2011-08-22 DIAGNOSIS — G96198 Other disorders of meninges, not elsewhere classified: Secondary | ICD-10-CM | POA: Diagnosis present

## 2011-08-22 DIAGNOSIS — Z9861 Coronary angioplasty status: Secondary | ICD-10-CM

## 2011-08-22 DIAGNOSIS — J449 Chronic obstructive pulmonary disease, unspecified: Secondary | ICD-10-CM | POA: Diagnosis present

## 2011-08-22 DIAGNOSIS — I252 Old myocardial infarction: Secondary | ICD-10-CM

## 2011-08-22 DIAGNOSIS — I739 Peripheral vascular disease, unspecified: Secondary | ICD-10-CM | POA: Diagnosis present

## 2011-08-22 DIAGNOSIS — G4700 Insomnia, unspecified: Secondary | ICD-10-CM

## 2011-08-22 DIAGNOSIS — Y834 Other reconstructive surgery as the cause of abnormal reaction of the patient, or of later complication, without mention of misadventure at the time of the procedure: Secondary | ICD-10-CM | POA: Diagnosis present

## 2011-08-22 HISTORY — PX: LUMBAR WOUND DEBRIDEMENT: SHX1988

## 2011-08-22 LAB — POCT I-STAT 4, (NA,K, GLUC, HGB,HCT)
HCT: 41 % (ref 39.0–52.0)
Sodium: 139 mEq/L (ref 135–145)

## 2011-08-22 SURGERY — LUMBAR WOUND DEBRIDEMENT
Anesthesia: General | Site: Back | Wound class: Clean

## 2011-08-22 MED ORDER — METOPROLOL SUCCINATE ER 50 MG PO TB24
50.0000 mg | ORAL_TABLET | Freq: Every day | ORAL | Status: DC
  Administered 2011-08-24: 50 mg via ORAL
  Filled 2011-08-22 (×4): qty 1

## 2011-08-22 MED ORDER — FINASTERIDE 5 MG PO TABS
5.0000 mg | ORAL_TABLET | Freq: Every day | ORAL | Status: DC
  Administered 2011-08-23 – 2011-08-24 (×2): 5 mg via ORAL
  Filled 2011-08-22 (×4): qty 1

## 2011-08-22 MED ORDER — PROPOFOL 10 MG/ML IV EMUL
INTRAVENOUS | Status: DC | PRN
  Administered 2011-08-22: 90 mg via INTRAVENOUS

## 2011-08-22 MED ORDER — ONDANSETRON HCL 4 MG/2ML IJ SOLN
4.0000 mg | INTRAMUSCULAR | Status: DC | PRN
  Filled 2011-08-22: qty 2

## 2011-08-22 MED ORDER — EPHEDRINE SULFATE 50 MG/ML IJ SOLN
INTRAMUSCULAR | Status: DC | PRN
  Administered 2011-08-22: 10 mg via INTRAVENOUS

## 2011-08-22 MED ORDER — MENTHOL 3 MG MT LOZG
1.0000 | LOZENGE | OROMUCOSAL | Status: DC | PRN
  Filled 2011-08-22: qty 9

## 2011-08-22 MED ORDER — POTASSIUM CHLORIDE IN NACL 20-0.9 MEQ/L-% IV SOLN
INTRAVENOUS | Status: DC
  Filled 2011-08-22 (×5): qty 1000

## 2011-08-22 MED ORDER — FAMOTIDINE 20 MG PO TABS
20.0000 mg | ORAL_TABLET | Freq: Every day | ORAL | Status: DC
  Administered 2011-08-22 – 2011-08-24 (×3): 20 mg via ORAL
  Filled 2011-08-22 (×4): qty 1

## 2011-08-22 MED ORDER — SODIUM CHLORIDE 0.9 % IV SOLN: 250.0000 mL | INTRAVENOUS | Status: DC

## 2011-08-22 MED ORDER — NEOSTIGMINE METHYLSULFATE 1 MG/ML IJ SOLN
INTRAMUSCULAR | Status: DC | PRN
  Administered 2011-08-22: 4 mg via INTRAVENOUS

## 2011-08-22 MED ORDER — SODIUM CHLORIDE 0.9 % IJ SOLN: 3.0000 mL | INTRAMUSCULAR | Status: DC | PRN

## 2011-08-22 MED ORDER — LACTATED RINGERS IV SOLN: INTRAVENOUS | Status: DC

## 2011-08-22 MED ORDER — ACETAMINOPHEN 650 MG RE SUPP: 650.0000 mg | RECTAL | Status: DC | PRN

## 2011-08-22 MED ORDER — FENTANYL CITRATE 0.05 MG/ML IJ SOLN
INTRAMUSCULAR | Status: DC | PRN
  Administered 2011-08-22: 100 ug via INTRAVENOUS

## 2011-08-22 MED ORDER — CEFAZOLIN SODIUM-DEXTROSE 2-3 GM-% IV SOLR
INTRAVENOUS | Status: AC
  Administered 2011-08-22: 2 g via INTRAVENOUS
  Filled 2011-08-22: qty 50

## 2011-08-22 MED ORDER — ISOSORBIDE MONONITRATE ER 60 MG PO TB24
60.0000 mg | ORAL_TABLET | Freq: Every day | ORAL | Status: DC
  Administered 2011-08-22 – 2011-08-24 (×3): 60 mg via ORAL
  Filled 2011-08-22 (×4): qty 1

## 2011-08-22 MED ORDER — SODIUM CHLORIDE 0.9 % IJ SOLN
3.0000 mL | Freq: Two times a day (BID) | INTRAMUSCULAR | Status: DC
  Administered 2011-08-23 – 2011-08-24 (×4): 3 mL via INTRAVENOUS

## 2011-08-22 MED ORDER — PHENOL 1.4 % MT LIQD
1.0000 | OROMUCOSAL | Status: DC | PRN
  Filled 2011-08-22: qty 177

## 2011-08-22 MED ORDER — POTASSIUM CHLORIDE CRYS ER 20 MEQ PO TBCR
20.0000 meq | EXTENDED_RELEASE_TABLET | ORAL | Status: DC
  Administered 2011-08-23: 20 meq via ORAL
  Filled 2011-08-22 (×2): qty 1

## 2011-08-22 MED ORDER — CALCIUM CARBONATE-VITAMIN D 500-200 MG-UNIT PO TABS
1.0000 | ORAL_TABLET | Freq: Two times a day (BID) | ORAL | Status: DC
  Administered 2011-08-23 – 2011-08-24 (×4): 1 via ORAL
  Filled 2011-08-22 (×7): qty 1

## 2011-08-22 MED ORDER — PHENYLEPHRINE HCL 10 MG/ML IJ SOLN
INTRAMUSCULAR | Status: DC | PRN
  Administered 2011-08-22 (×2): 80 ug via INTRAVENOUS

## 2011-08-22 MED ORDER — TEMAZEPAM 15 MG PO CAPS: 15.0000 mg | ORAL_CAPSULE | Freq: Every evening | ORAL | Status: DC | PRN

## 2011-08-22 MED ORDER — HYDROCODONE-ACETAMINOPHEN 5-325 MG PO TABS
1.0000 | ORAL_TABLET | Freq: Four times a day (QID) | ORAL | Status: DC | PRN
  Administered 2011-08-22 (×2): 1 via ORAL
  Administered 2011-08-23 – 2011-08-24 (×3): 2 via ORAL
  Filled 2011-08-22: qty 1
  Filled 2011-08-22: qty 2
  Filled 2011-08-22: qty 1
  Filled 2011-08-22 (×2): qty 2
  Filled 2011-08-22: qty 1

## 2011-08-22 MED ORDER — NITROGLYCERIN 0.4 MG SL SUBL
0.4000 mg | SUBLINGUAL_TABLET | SUBLINGUAL | Status: DC | PRN
  Administered 2011-08-22 (×2): 0.4 mg via SUBLINGUAL

## 2011-08-22 MED ORDER — FUROSEMIDE 20 MG PO TABS
20.0000 mg | ORAL_TABLET | ORAL | Status: DC
Start: 2011-08-23 — End: 2011-08-25
  Administered 2011-08-23: 20 mg via ORAL
  Filled 2011-08-22 (×2): qty 1

## 2011-08-22 MED ORDER — CARBAMAZEPINE ER 300 MG PO CP12
300.0000 mg | ORAL_CAPSULE | Freq: Two times a day (BID) | ORAL | Status: DC
  Administered 2011-08-22 – 2011-08-23 (×2): 300 mg via ORAL
  Filled 2011-08-22 (×3): qty 1

## 2011-08-22 MED ORDER — 0.9 % SODIUM CHLORIDE (POUR BTL) OPTIME
TOPICAL | Status: DC | PRN
  Administered 2011-08-22: 1000 mL

## 2011-08-22 MED ORDER — NITROGLYCERIN 0.4 MG SL SUBL: 0.4000 mg | SUBLINGUAL_TABLET | SUBLINGUAL | Status: DC | PRN

## 2011-08-22 MED ORDER — ASPIRIN 325 MG PO TABS
325.0000 mg | ORAL_TABLET | Freq: Every day | ORAL | Status: DC
  Administered 2011-08-22 – 2011-08-24 (×3): 325 mg via ORAL
  Filled 2011-08-22 (×4): qty 1

## 2011-08-22 MED ORDER — ALBUTEROL SULFATE HFA 108 (90 BASE) MCG/ACT IN AERS
2.0000 | INHALATION_SPRAY | Freq: Four times a day (QID) | RESPIRATORY_TRACT | Status: DC | PRN
  Administered 2011-08-23: 2 via RESPIRATORY_TRACT
  Filled 2011-08-22: qty 6.7

## 2011-08-22 MED ORDER — CEFAZOLIN SODIUM 1-5 GM-% IV SOLN
1.0000 g | Freq: Three times a day (TID) | INTRAVENOUS | Status: AC
  Administered 2011-08-23: 1 g via INTRAVENOUS
  Filled 2011-08-22 (×2): qty 50

## 2011-08-22 MED ORDER — FENTANYL CITRATE 0.05 MG/ML IJ SOLN: 25.0000 ug | INTRAMUSCULAR | Status: DC | PRN

## 2011-08-22 MED ORDER — LACTATED RINGERS IV SOLN
INTRAVENOUS | Status: DC | PRN
  Administered 2011-08-22: 13:00:00 via INTRAVENOUS

## 2011-08-22 MED ORDER — MORPHINE SULFATE 4 MG/ML IJ SOLN
1.0000 mg | INTRAMUSCULAR | Status: DC | PRN
  Administered 2011-08-22: 2 mg via INTRAVENOUS
  Administered 2011-08-23 (×2): 4 mg via INTRAVENOUS
  Administered 2011-08-23: 2 mg via INTRAVENOUS
  Filled 2011-08-22 (×3): qty 1

## 2011-08-22 MED ORDER — GLYCOPYRROLATE 0.2 MG/ML IJ SOLN
INTRAMUSCULAR | Status: DC | PRN
  Administered 2011-08-22: .6 mg via INTRAVENOUS

## 2011-08-22 MED ORDER — ACETAMINOPHEN 325 MG PO TABS: 650.0000 mg | ORAL_TABLET | ORAL | Status: DC | PRN

## 2011-08-22 MED ORDER — SUCCINYLCHOLINE CHLORIDE 20 MG/ML IJ SOLN
INTRAMUSCULAR | Status: DC | PRN
  Administered 2011-08-22: 100 mg via INTRAVENOUS

## 2011-08-22 MED ORDER — ROSUVASTATIN CALCIUM 20 MG PO TABS
20.0000 mg | ORAL_TABLET | Freq: Every day | ORAL | Status: DC
  Administered 2011-08-22 – 2011-08-24 (×3): 20 mg via ORAL
  Filled 2011-08-22 (×4): qty 1

## 2011-08-22 MED ORDER — ONDANSETRON HCL 4 MG/2ML IJ SOLN
INTRAMUSCULAR | Status: DC | PRN
  Administered 2011-08-22: 4 mg via INTRAVENOUS

## 2011-08-22 MED ORDER — ZOLPIDEM TARTRATE 5 MG PO TABS
5.0000 mg | ORAL_TABLET | Freq: Every evening | ORAL | Status: DC | PRN
  Administered 2011-08-22: 5 mg via ORAL
  Filled 2011-08-22: qty 1

## 2011-08-22 MED ORDER — PROMETHAZINE HCL 25 MG/ML IJ SOLN
6.2500 mg | INTRAMUSCULAR | Status: DC | PRN
  Filled 2011-08-22: qty 1

## 2011-08-22 MED ORDER — ROCURONIUM BROMIDE 100 MG/10ML IV SOLN
INTRAVENOUS | Status: DC | PRN
  Administered 2011-08-22: 30 mg via INTRAVENOUS

## 2011-08-22 MED ORDER — THROMBIN 20000 UNITS EX KIT
PACK | CUTANEOUS | Status: DC | PRN
  Administered 2011-08-22: 14:00:00 via TOPICAL

## 2011-08-22 MED ORDER — SERTRALINE HCL 50 MG PO TABS
150.0000 mg | ORAL_TABLET | Freq: Every day | ORAL | Status: DC
  Administered 2011-08-23 – 2011-08-24 (×2): 150 mg via ORAL
  Filled 2011-08-22 (×4): qty 1

## 2011-08-22 SURGICAL SUPPLY — 45 items
APL SKNCLS STERI-STRIP NONHPOA (GAUZE/BANDAGES/DRESSINGS) ×1
BAG DECANTER FOR FLEXI CONT (MISCELLANEOUS) ×4 IMPLANT
BENZOIN TINCTURE PRP APPL 2/3 (GAUZE/BANDAGES/DRESSINGS) ×2 IMPLANT
CANISTER SUCTION 2500CC (MISCELLANEOUS) ×2 IMPLANT
CLOTH BEACON ORANGE TIMEOUT ST (SAFETY) ×2 IMPLANT
DRAPE LAPAROTOMY 100X72X124 (DRAPES) ×2 IMPLANT
DRAPE POUCH INSTRU U-SHP 10X18 (DRAPES) ×2 IMPLANT
DRESSING TELFA 8X3 (GAUZE/BANDAGES/DRESSINGS) ×2 IMPLANT
DRSG OPSITE 4X5.5 SM (GAUZE/BANDAGES/DRESSINGS) ×2 IMPLANT
DURAPREP 26ML APPLICATOR (WOUND CARE) ×1 IMPLANT
DURAPREP 6ML APPLICATOR 50/CS (WOUND CARE) IMPLANT
ELECT REM PT RETURN 9FT ADLT (ELECTROSURGICAL) ×2
ELECTRODE REM PT RTRN 9FT ADLT (ELECTROSURGICAL) ×1 IMPLANT
GAUZE SPONGE 4X4 16PLY XRAY LF (GAUZE/BANDAGES/DRESSINGS) IMPLANT
GLOVE BIO SURGEON STRL SZ7 (GLOVE) ×1 IMPLANT
GLOVE BIO SURGEON STRL SZ8 (GLOVE) ×2 IMPLANT
GLOVE INDICATOR 7.5 STRL GRN (GLOVE) ×1 IMPLANT
GOWN BRE IMP SLV AUR LG STRL (GOWN DISPOSABLE) ×1 IMPLANT
GOWN BRE IMP SLV AUR XL STRL (GOWN DISPOSABLE) ×1 IMPLANT
GOWN STRL REIN 2XL LVL4 (GOWN DISPOSABLE) ×1 IMPLANT
KIT BASIN OR (CUSTOM PROCEDURE TRAY) ×2 IMPLANT
KIT ROOM TURNOVER OR (KITS) ×2 IMPLANT
NDL HYPO 18GX1.5 BLUNT FILL (NEEDLE) IMPLANT
NDL HYPO 25X1 1.5 SAFETY (NEEDLE) ×1 IMPLANT
NDL SPNL 20GX3.5 QUINCKE YW (NEEDLE) IMPLANT
NEEDLE HYPO 18GX1.5 BLUNT FILL (NEEDLE) IMPLANT
NEEDLE HYPO 25X1 1.5 SAFETY (NEEDLE) ×2 IMPLANT
NEEDLE SPNL 20GX3.5 QUINCKE YW (NEEDLE) IMPLANT
NS IRRIG 1000ML POUR BTL (IV SOLUTION) ×2 IMPLANT
PACK LAMINECTOMY NEURO (CUSTOM PROCEDURE TRAY) ×2 IMPLANT
PAD ARMBOARD 7.5X6 YLW CONV (MISCELLANEOUS) ×7 IMPLANT
SEALANT DURASEAL SPINE 3ML (Neuro Prosthesis/Implant) ×1 IMPLANT
STRIP CLOSURE SKIN 1/2X4 (GAUZE/BANDAGES/DRESSINGS) ×2 IMPLANT
SUT PROLENE 6 0 BV (SUTURE) ×1 IMPLANT
SUT VIC AB 0 CT1 18XCR BRD8 (SUTURE) ×1 IMPLANT
SUT VIC AB 0 CT1 8-18 (SUTURE) ×2
SUT VIC AB 2-0 CP2 18 (SUTURE) ×2 IMPLANT
SUT VIC AB 3-0 SH 8-18 (SUTURE) ×2 IMPLANT
SWAB CULTURE LIQ STUART DBL (MISCELLANEOUS) ×1 IMPLANT
SYR 20ML ECCENTRIC (SYRINGE) ×2 IMPLANT
SYR 3ML LL SCALE MARK (SYRINGE) IMPLANT
TOWEL OR 17X24 6PK STRL BLUE (TOWEL DISPOSABLE) ×2 IMPLANT
TOWEL OR 17X26 10 PK STRL BLUE (TOWEL DISPOSABLE) ×2 IMPLANT
TUBE ANAEROBIC SPECIMEN COL (MISCELLANEOUS) ×1 IMPLANT
WATER STERILE IRR 1000ML POUR (IV SOLUTION) ×3 IMPLANT

## 2011-08-22 NOTE — Addendum Note (Signed)
Addendum  created 08/22/11 1516 by Germaine Pomfret, MD   Modules edited:Orders

## 2011-08-22 NOTE — Anesthesia Postprocedure Evaluation (Signed)
  Anesthesia Post-op Note  Patient: [name]  Procedure(s) Performed:  LUMBAR WOUND DEBRIDEMENT - Repair of CSF Leak requiring laminectomy  Patient Location: PACU  Anesthesia Type: General  Level of Consciousness: awake, alert  and oriented  Airway and Oxygen Therapy: Patient Spontanous Breathing and Patient connected to nasal cannula oxygen  Post-op Pain: none  Post-op Assessment: Post-op Vital signs reviewed, Patient's Cardiovascular Status Stable, Respiratory Function Stable, Patent Airway, No signs of Nausea or vomiting and Pain level controlled  Post-op Vital Signs: Reviewed and stable  Complications: No apparent anesthesia complications

## 2011-08-22 NOTE — Transfer of Care (Signed)
Immediate Anesthesia Transfer of Care Note  Patient: [name]  Procedure(s) Performed:  LUMBAR WOUND DEBRIDEMENT - Repair of CSF Leak requiring laminectomy  Patient Location: PACU  Anesthesia Type: General  Level of Consciousness: awake, alert  and oriented  Airway & Oxygen Therapy: Patient Spontanous Breathing and Patient connected to nasal cannula oxygen  Post-op Assessment: Report given to PACU RN, Post -op Vital signs reviewed and stable and Patient moving all extremities X 4  Post vital signs: Reviewed and stable  Complications: No apparent anesthesia complications

## 2011-08-22 NOTE — Progress Notes (Signed)
3000 staff to send family to central waiting 2nd floor.

## 2011-08-22 NOTE — Progress Notes (Signed)
Pt c/o "heart burn" in the left chest. No heart rhythm changes noticed. Pt states it is the same that he experiences at home and if he was at home he would take a nitroglycerin tablet. Called Dr. Jean Rosenthal who came to the bedside. Orders rec'd.

## 2011-08-22 NOTE — Preoperative (Signed)
Beta Blockers   Reason not to administer Beta Blockers:Not Applicable 

## 2011-08-22 NOTE — H&P (Signed)
Subjective: Patient is a 77 y.o. male admitted for CSF leak. Onset of symptoms was a few days ago, gradually worsening since that time.  The pain is rated mild, and is located at the head when standing. The pain is described as aching and occurs all day. The symptoms have been progressive. Symptoms are exacerbated by standing. S/P R L5-S1 microdiskectomy 11 days ago.  Past Medical History  Diagnosis Date  . Ischemic heart disease   . Myocardial infarction   . CHF (congestive heart failure)   . Hypoxia     chronic  . Angina     when walking  . Dizziness     with low blood pressure  . Dyslipidemia (high LDL; low HDL)   . Hypertension   . Coronary artery disease   . Heart murmur   . Peripheral vascular disease     thrombus- in leg- many yrs. ago- ?R- coumadin x1 mth  . Pneumonia     hosp. long time ago   . Shortness of breath   . COPD (chronic obstructive pulmonary disease)     Dec. 2010- d/c O2(had been using for 2 mths)  . Blood transfusion     post CABG  . Hepatitis     hepatitis- long time ago, occupational contamination   . GERD (gastroesophageal reflux disease)   . Cancer     prostate - Ca- radiation therapy- 2002  . Peripheral neuropathy   . Hiatal hernia   . Arthritis     hnp, lumbar, ankle (foot drop-R), wears a splint, arthritis - "all over", low bone density   . Depression     uses Zoloft     Past Surgical History  Procedure Date  . Coronary artery bypass graft 1986 and 1993  . Ptca 2008  . Cardiac catheterization     2010- last cath, two stents at that time  . Eye surgery     cataracts - bilateral , IOL  . Hernia repair     bilateral inguiinal repair   . Tonsillectomy   . Fracture surgery     heel- crushed -2002,  (hardware)   . Joint replacement     R knee- arthroscopic, x2, R great toe- 2 surgeries- /w hardware & thenlater fused by Dr. Lajoyce Corners    . Appendectomy   . Rhinoplasty     as a teen, followed by same procedure at 1970's   . Lumbar  laminectomy/decompression microdiscectomy 08/10/2011    Procedure: LUMBAR LAMINECTOMY/DECOMPRESSION MICRODISCECTOMY;  Surgeon: Kathaleen Maser Pool;  Location: [facility];  Service: Neurosurgery;  Laterality: Right;  RIGHT Lumbar five-sacral one LAMINECTOMY, MICRODISCECTOMY    Prior to Admission medications   Medication Sig Start Date End Date Taking? Authorizing Provider  albuterol (PROVENTIL HFA;VENTOLIN HFA) 108 (90 BASE) MCG/ACT inhaler Inhale 2 puffs into the lungs every 6 (six) hours as needed. For shortness of breath.     Historical Provider, MD  aspirin 325 MG tablet Take 325 mg by mouth daily.      Historical Provider, MD  calcium-vitamin D (OSCAL WITH D 500-200) 500-200 MG-UNIT per tablet Take 1 tablet by mouth 2 (two) times daily.      Historical Provider, MD  carbamazepine (CARBATROL) 300 MG 12 hr capsule Take 300 mg by mouth 2 (two) times daily.      Historical Provider, MD  clopidogrel (PLAVIX) 75 MG tablet Take 75 mg by mouth daily.      Historical Provider, MD  cyclobenzaprine (FLEXERIL) 10 MG tablet  Take 1 tablet (10 mg total) by mouth 3 (three) times daily as needed for muscle spasms. 08/11/11 08/21/11  Sherilyn Cooter A Pool  finasteride (PROSCAR) 5 MG tablet Take 5 mg by mouth daily.      Historical Provider, MD  furosemide (LASIX) 20 MG tablet Take 20 mg by mouth every other day.      Historical Provider, MD  HYDROcodone-acetaminophen (NORCO) 5-325 MG per tablet Take 1-2 tablets by mouth every 4 (four) hours as needed. 08/11/11 08/21/11  Sherilyn Cooter A Pool  isosorbide mononitrate (IMDUR) 60 MG 24 hr tablet TAKE 1 TABLET BY MOUTH EVERY MORNING 07/01/11   Cassell Clement, MD  metoprolol (TOPROL-XL) 50 MG 24 hr tablet Take 50 mg by mouth daily.      Historical Provider, MD  nitroGLYCERIN (NITROSTAT) 0.4 MG SL tablet Place 1 tablet (0.4 mg total) under the tongue every 5 (five) minutes as needed. 01/08/11   Cassell Clement, MD  potassium chloride SA (K-DUR,KLOR-CON) 20 MEQ tablet Take 20 mEq by mouth  every Monday, Wednesday, and Friday.     Historical Provider, MD  ranitidine (ZANTAC) 150 MG tablet Take 1 tablet (150 mg total) by mouth daily. 06/01/11   Cassell Clement, MD  rosuvastatin (CRESTOR) 20 MG tablet Take 20 mg by mouth daily.      Historical Provider, MD  sertraline (ZOLOFT) 100 MG tablet Take 150 mg by mouth daily.     Historical Provider, MD  temazepam (RESTORIL) 15 MG capsule Take 1 capsule (15 mg total) by mouth at bedtime as needed for sleep. 07/02/11   Cassell Clement, MD   No Known Allergies  History  Substance Use Topics  . Smoking status: Never Smoker   . Smokeless tobacco: Not on file  . Alcohol Use: No    Family History  Problem Relation Age of Onset  . Heart attack Mother   . Addison's disease Father   . Heart attack Sister   . Anesthesia problems Neg Hx   . Hypotension Neg Hx   . Malignant hyperthermia Neg Hx   . Pseudochol deficiency Neg Hx      Review of Systems  Positive ROS: neg  All other systems have been reviewed and were otherwise negative with the exception of those mentioned in the HPI and as above.  Objective: Vital signs in last 24 hours:    General Appearance: Alert, cooperative, no distress, appears stated age Head: Normocephalic, without obvious abnormality, atraumatic Eyes: PERRL, conjunctiva/corneas clear, EOM's intact, fundi benign, both eyes      Ears: Normal TM's and external ear canals, both ears Throat: Lips, mucosa, and tongue normal; teeth and gums normal Neck: Supple, symmetrical, trachea midline, no adenopathy; thyroid: No enlargement/tenderness/nodules; no carotid bruit or JVD Back: clear liquid draining from wound Lungs: Clear to auscultation bilaterally, respirations unlabored Heart: Regular rate and rhythm, S1 and S2 normal, no murmur, rub or gallop Abdomen: Soft, non-tender, bowel sounds active all four quadrants, no masses, no organomegaly Extremities: Extremities normal, atraumatic, no cyanosis or edema Pulses:  2+ and symmetric all extremities Skin: Skin color, texture, turgor normal, no rashes or lesions  NEUROLOGIC:   Mental status: Alert and oriented x4,  no aphasia, good attention span, fund of knowledge, and memory Motor Exam - grossly normal except R foot drop Sensory Exam - grossly normal Reflexes: decreased Coordination - grossly normal Gait - grossly normal except R foot drop/ walker Balance - grossly normal Cranial Nerves: I: smell Not tested  II: visual acuity  OS: nl  OD: nl  II: visual fields Full to confrontation  II: pupils Equal, round, reactive to light  III,VII: ptosis None  III,IV,VI: extraocular muscles  Full ROM  V: mastication Normal  V: facial light touch sensation  Normal  V,VII: corneal reflex  Present  VII: facial muscle function - upper  Normal  VII: facial muscle function - lower Normal  VIII: hearing Not tested  IX: soft palate elevation  Normal  IX,X: gag reflex Present  XI: trapezius strength  5/5  XI: sternocleidomastoid strength 5/5  XI: neck flexion strength  5/5  XII: tongue strength  Normal    Data Review Lab Results  Component Value Date   WBC 5.2 08/10/2011   HGB 12.7* 08/10/2011   HCT 36.8* 08/10/2011   MCV 98.7 08/10/2011   PLT 219 08/10/2011   Lab Results  Component Value Date   NA 141 08/09/2011   K 4.2 08/09/2011   CL 106 08/09/2011   CO2 28 08/09/2011   BUN 15 08/09/2011   CREATININE 0.80 08/09/2011   GLUCOSE 97 08/09/2011   Lab Results  Component Value Date   INR 1.2 04/14/2009    Assessment/Plan: Patient admitted for lumbar exploration for CSF leak.  I explained the condition and procedure to the patient and answered any questions.  Patient wishes to proceed with procedure as planned. Understands risks/ benefits and typical outcomes of procedure.   Myishia Kasik S 08/22/2011 12:18 PM

## 2011-08-22 NOTE — Op Note (Signed)
08/22/2011  2:03 PM  PATIENT:  [name]  77 y.o. male  PRE-OPERATIVE DIAGNOSIS:  Lumbar pseudomeningocele  POST-OPERATIVE DIAGNOSIS:  Same  PROCEDURE:  Lumbar reexploration with repair of pseudomeningocele requiring laminectomy  SURGEON:  Marikay Alar, MD  ASSISTANTS: None  ANESTHESIA:   General  EBL: Minimal ml  Total I/O In: 700 [I.V.:700] Out: 20 [Blood:20]  BLOOD ADMINISTERED:none  DRAINS: None   SPECIMEN:  No Specimen  INDICATION FOR PROCEDURE: 77 year old male who underwent a microdiscectomy at L5-S1 on the right by Dr. Jordan Likes about 11 days ago. Patient presented with a several-day history of drainage from his wound, with development of a clear drainage today with associated positional headache. Recommended a lumbar reexploration with repair of a probable pseudomeningocele. Patient understood the risks, benefits, and alternatives and potential outcomes and wished to proceed.  PROCEDURE DETAILS: Patient was taken to the operating room and after induction of adequate left endotracheal anesthesia was rolled into the prone position on the Wilson frame. All pressure points were padded. His lumbar region was prepped with DuraPrep and draped usual sterile fashion. His old incision was opened with release of some clear CSF. The fascia was opened, the laminotomy was exposed, and a self-retaining retractor was placed. I removed the Gelfoam and found that the leak was coming from the midline under the spinous process. Therefore I removed the retractor and took down the paraspinous musculature on the left and replaced my retractor. I removed the inferior half of the spinous process and undercut the spinous process and the left-sided lamina to expose the durotomy. I was then able to close the durotomy with a single 6-0 Prolene suture. Valsalva maneuver ensured good closure. I lined the dura with Gelfoam and Tisseel fibrin glue after irrigating with saline solution. I then closed the  fascia with 0 Vicryl, subcutaneous tissues with 2-0 Vicryl, and the subcuticular tissues with 3-0 Vicryl. Skin was closed with benzoin Steri-Strips. A sterile dressing was applied. She was awake of joint space and transferred to the recovery in stable condition. at the end of the procedure all sponge, needle and instrument counts are correct.  PLAN OF CARE: Admit to inpatient   PATIENT DISPOSITION:  PACU - hemodynamically stable.   Delay start of Pharmacological VTE agent (>24hrs) due to surgical blood loss or risk of bleeding:  yes

## 2011-08-22 NOTE — Anesthesia Preprocedure Evaluation (Addendum)
Anesthesia Evaluation  Patient identified by MRN, date of birth, ID band Patient awake    Reviewed: Allergy & Precautions, H&P , NPO status , Patient's Chart, lab work & pertinent test results, reviewed documented beta blocker date and time   Airway Mallampati: II TM Distance: >3 FB Neck ROM: Full    Dental No notable dental hx. (+) Teeth Intact and Dental Advisory Given   Pulmonary COPD (albuterol almost daily, off home O2 since 2010) COPD inhaler,  clear to auscultation  Pulmonary exam normal       Cardiovascular hypertension (took metoprolol today), Pt. on medications and Pt. on home beta blockers + angina (last NTG a few months ago) with exertion + CAD (extensive ASCADZ, last cath 2010 showed 3 vessel dz and received stents to vein grafts, remains on Plavix), + Past MI (s/p MI 2010, has LBBB) and +CHF (h/o chronic CHF, well controlled since 2010, ECHO 03/2009 EF >50%) - dysrhythmias Regular Normal    Neuro/Psych Depression    GI/Hepatic hiatal hernia, GERD-  Medicated and Controlled,(+) Hepatitis -  Endo/Other  Negative Endocrine ROS  Renal/GU negative Renal ROS     Musculoskeletal   Abdominal   Peds  Hematology   Anesthesia Other Findings   Reproductive/Obstetrics                          Anesthesia Physical Anesthesia Plan  ASA: III and Emergent  Anesthesia Plan: General   Post-op Pain Management:    Induction: Intravenous  Airway Management Planned: Oral ETT  Additional Equipment:   Intra-op Plan:   Post-operative Plan: Extubation in OR  Informed Consent: I have reviewed the patients History and Physical, chart, labs and discussed the procedure including the risks, benefits and alternatives for the proposed anesthesia with the patient or authorized representative who has indicated his/her understanding and acceptance.   Dental advisory given  Plan Discussed with: CRNA and  Surgeon  Anesthesia Plan Comments: (Plan routine monitors, GETA)       Anesthesia Quick Evaluation

## 2011-08-22 NOTE — Anesthesia Procedure Notes (Signed)
Procedure Name: Intubation Date/Time: 08/22/2011 1:11 PM Performed by: Rosita Fire Pre-anesthesia Checklist: Emergency Drugs available, Suction available, Patient being monitored, Patient identified and Timeout performed Patient Re-evaluated:Patient Re-evaluated prior to inductionOxygen Delivery Method: Circle System Utilized Preoxygenation: Pre-oxygenation with 100% oxygen Intubation Type: IV induction, Circoid Pressure applied and Rapid sequence Laryngoscope Size: Miller and 2 Grade View: Grade II Tube type: Oral Tube size: 7.5 mm Number of attempts: 1 Placement Confirmation: ETT inserted through vocal cords under direct vision,  breath sounds checked- equal and bilateral and positive ETCO2 Secured at: 23 cm Tube secured with: Tape Dental Injury: Teeth and Oropharynx as per pre-operative assessment

## 2011-08-23 MED ORDER — CARBAMAZEPINE ER 200 MG PO TB12
300.0000 mg | ORAL_TABLET | Freq: Two times a day (BID) | ORAL | Status: DC
  Administered 2011-08-23 – 2011-08-24 (×3): 300 mg via ORAL
  Filled 2011-08-23 (×5): qty 1

## 2011-08-23 NOTE — Progress Notes (Signed)
Patient ID: [name], male   DOB: Jan 22, 1925, 77 y.o.   MRN: [id number] Subjective: Patient reports back is quite sore. Mild headache. No leg pain.  Objective: Vital signs in last 24 hours: Temp:  [97.4 F (36.3 C)-98.4 F (36.9 C)] 98 F (36.7 C) (12/24 0458) Pulse Rate:  [59-72] 59  (12/24 0458) Resp:  [6-22] 16  (12/24 0458) BP: (95-131)/(32-67) 108/50 mmHg (12/24 0635) SpO2:  [92 %-100 %] 93 % (12/24 0458) Weight:  [74.844 kg (165 lb)] 165 lb (74.844 kg) (12/24 0635)  Intake/Output from previous day: 12/23 0701 - 12/24 0700 In: 800 [I.V.:800] Out: 220 [Urine:200; Blood:20] Intake/Output this shift: Total I/O In: -  Out: 1100 [Urine:1100]  Neurologic: Grossly normal Dressing dry old R foot drop unchanged  Lab Results: Lab Results  Component Value Date   WBC 5.2 08/10/2011   HGB 13.9 08/22/2011   HCT 41.0 08/22/2011   MCV 98.7 08/10/2011   PLT 219 08/10/2011   Lab Results  Component Value Date   INR 1.2 04/14/2009   BMET Lab Results  Component Value Date   NA 139 08/22/2011   K 4.3 08/22/2011   CL 106 08/09/2011   CO2 28 08/09/2011   GLUCOSE 98 08/22/2011   BUN 15 08/09/2011   CREATININE 0.80 08/09/2011   CALCIUM 9.9 08/09/2011    Studies/Results: No results found.  Assessment/Plan: Doing ok. Wound dry. Flat one more day, then OOB.   LOS: 1 day    Yexalen Deike S 08/23/2011, 8:12 AM

## 2011-08-24 NOTE — Progress Notes (Signed)
Subjective: Patient reports No complaints. Anxious to mobilize  Objective: Vital signs in last 24 hours: Temp:  [96.8 F (36 C)-98.3 F (36.8 C)] 97.8 F (36.6 C) (12/25 1100) Pulse Rate:  [58-64] 60  (12/25 1100) Resp:  [19-20] 20  (12/25 1100) BP: (105-154)/(54-77) 154/77 mmHg (12/25 1100) SpO2:  [93 %-96 %] 96 % (12/25 1100)  Intake/Output from previous day: 12/24 0701 - 12/25 0700 In: -  Out: 2900 [Urine:2900] Intake/Output this shift:    OpSite dressing with blood soaked Telfa intact. Motor function intact in lower extremities.  Lab Results:  Basename 08/22/11 1236  WBC --  HGB 13.9  HCT 41.0  PLT --   BMET  Basename 08/22/11 1236  NA 139  K 4.3  CL --  CO2 --  GLUCOSE 98  BUN --  CREATININE --  CALCIUM --    Studies/Results: No results found.  Assessment/Plan: Stable on bedrest. To start mobilization today.  LOS: 2 days  Mobilize progressively. Maintain on telemetry until ambulatory.   Zaion Hreha J 08/24/2011, 12:59 PM

## 2011-08-24 NOTE — Progress Notes (Signed)
Pt had tolerated slow progression from lay flat orders to dangle feet orders over the past few hours.  Slight nausea and abdominal discomfort from lack of BM, otherwise no complaints.  VSS, pt resting in bed.  Will continue to monitor and encourage. Ave Filter

## 2011-08-25 NOTE — Discharge Summary (Signed)
Physician Discharge Summary  Patient ID: [name] MRN: [id number] DOB/AGE: 10/21/1924 77 y.o.  Admit date: 08/22/2011 Discharge date: 08/25/2011  Admission Diagnoses: CSF leak after microdiskectomy   Discharge Diagnoses: same   Discharged Condition: good  Hospital Course: The patient was admitted on 08/22/2011 and taken to the operating room where the patient underwent laminectomy for pseudomeningocele repair. The patient tolerated the procedure well and was taken to the recovery room and then to the floor in stable condition. The hospital course was routine. Remained at flat bedrest for 2 days, then mobilized. No headache or drainage.  There were no complications. The wound remained clean dry and intact. The patient remained afebrile with stable vital signs, and tolerated a regular diet. The patient continued to increase activities, and pain was well controlled with oral pain medications.   Consults: none  Significant Diagnostic Studies:  Results for orders placed during the hospital encounter of 08/22/11  POCT I-STAT 4, (NA,K, GLUC, HGB,HCT)      Component Value Range   Sodium 139  135 - 145 (mEq/L)   Potassium 4.3  3.5 - 5.1 (mEq/L)   Glucose, Bld 98  70 - 99 (mg/dL)   HCT 27.2  53.6 - 64.4 (%)   Hemoglobin 13.9  13.0 - 17.0 (g/dL)    Dg Chest 2 View  03/47/4259  *RADIOLOGY REPORT*  Clinical Data: Preop for lumbar laminectomy.  CHEST - 2 VIEW  Comparison: Chest x-ray 04/17/2009.  Findings: The heart is borderline enlarged but stable.  There is tortuosity and mild calcification of the thoracic aorta.  Stable surgical changes from bypass surgery.  The lungs demonstrate mild chronic bronchitic changes and right basilar scarring.  No acute overlying pulmonary process.  No pleural effusion.  The bony thorax is intact.  IMPRESSION: Chronic lung changes but no acute overlying pulmonary process.  Original Report Authenticated By: P. Loralie Champagne, M.D.   Dg Lumbar Spine 1  View  08/10/2011  *RADIOLOGY REPORT*  Clinical Data: L5-S1 microdiskectomy.  LUMBAR SPINE - 1 VIEW  Comparison: Lower 05/26/2011  Findings: Posterior surgical instruments are directed at the L5-S1 level.  IMPRESSION: Posterior surgical instruments at L5 S1.  Original Report Authenticated By: Cyndie Chime, M.D.    Antibiotics:  Anti-infectives     Start     Dose/Rate Route Frequency Ordered Stop   08/22/11 1800   ceFAZolin (ANCEF) IVPB 1 g/50 mL premix        1 g 100 mL/hr over 30 Minutes Intravenous Every 8 hours 08/22/11 1711 08/23/11 0959   08/22/11 1251   ceFAZolin (ANCEF) 2-3 GM-% IVPB SOLR     Comments: BECKNER, ZACK: cabinet override         08/22/11 1251 08/22/11 1257          Discharge Exam: Blood pressure 106/58, pulse 54, temperature 97.5 F (36.4 C), temperature source Oral, resp. rate 18, height 5\' 10"  (1.778 m), weight 74.844 kg (165 lb), SpO2 95.00%.  Neurologic: Grossly normal Except old footdrop on R  Discharge Medications:   Current Discharge Medication List    CONTINUE these medications which have NOT CHANGED   Details  albuterol (PROVENTIL HFA;VENTOLIN HFA) 108 (90 BASE) MCG/ACT inhaler Inhale 2 puffs into the lungs every 6 (six) hours as needed. For shortness of breath.     aspirin 325 MG tablet Take 325 mg by mouth daily.      calcium-vitamin D (OSCAL WITH D 500-200) 500-200 MG-UNIT per tablet Take 1 tablet by mouth 2 (  two) times daily.      carbamazepine (CARBATROL) 300 MG 12 hr capsule Take 300 mg by mouth 2 (two) times daily.      clopidogrel (PLAVIX) 75 MG tablet Take 75 mg by mouth daily.      finasteride (PROSCAR) 5 MG tablet Take 5 mg by mouth daily.      furosemide (LASIX) 20 MG tablet Take 20 mg by mouth every other day.      isosorbide mononitrate (IMDUR) 60 MG 24 hr tablet TAKE 1 TABLET BY MOUTH EVERY MORNING Qty: 30 tablet, Refills: 6    metoprolol (TOPROL-XL) 50 MG 24 hr tablet Take 50 mg by mouth daily.      nitroGLYCERIN  (NITROSTAT) 0.4 MG SL tablet Place 0.4 mg under the tongue every 5 (five) minutes as needed. For chest pain      potassium chloride SA (K-DUR,KLOR-CON) 20 MEQ tablet Take 20 mEq by mouth every Monday, Wednesday, and Friday.     ranitidine (ZANTAC) 150 MG tablet Take 1 tablet (150 mg total) by mouth daily. Qty: 90 tablet, Refills: 3    rosuvastatin (CRESTOR) 20 MG tablet Take 40 mg by mouth daily.      sertraline (ZOLOFT) 100 MG tablet Take 150 mg by mouth daily.     temazepam (RESTORIL) 15 MG capsule Take 1 capsule (15 mg total) by mouth at bedtime as needed for sleep. Qty: 30 capsule, Refills: 5   Associated Diagnoses: Insomnia        Disposition: home   Final Dx: pseudomeningocele repair  Discharge Orders    Future Appointments: Provider: Department: Dept Phone: Center:   11/02/2011 9:00 AM Lorenda Cahill [facility] [phone] [facility]   11/09/2011 9:45 AM Cassell Clement, MD [facility] [phone] None     Future Orders Please Complete By Expires   Diet - low sodium heart healthy      Increase activity slowly      No wound care      Call MD for:  temperature >100.4      Call MD for:  persistant nausea and vomiting      Call MD for:  severe uncontrolled pain      Call MD for:  redness, tenderness, or signs of infection (pain, swelling, redness, odor or green/yellow discharge around incision site)         Follow-up Information    Follow up with POOL,HENRY A in 1 week.   Contact information:   [address] [place] [address] [phone]           Signed: Tia Alert 08/25/2011, 8:44 AM

## 2011-08-25 NOTE — Progress Notes (Signed)
Foley removed at 0645 per MD order and per facility policy. Pt tolerated well. No complaints. Will continue to monitor

## 2011-08-26 ENCOUNTER — Encounter (HOSPITAL_COMMUNITY): Payer: Self-pay | Admitting: Neurological Surgery

## 2011-09-08 ENCOUNTER — Ambulatory Visit: Payer: Medicare Other | Attending: Neurosurgery | Admitting: Physical Therapy

## 2011-09-08 DIAGNOSIS — IMO0001 Reserved for inherently not codable concepts without codable children: Secondary | ICD-10-CM | POA: Insufficient documentation

## 2011-09-08 DIAGNOSIS — M545 Low back pain, unspecified: Secondary | ICD-10-CM | POA: Insufficient documentation

## 2011-09-08 DIAGNOSIS — R262 Difficulty in walking, not elsewhere classified: Secondary | ICD-10-CM | POA: Insufficient documentation

## 2011-09-08 DIAGNOSIS — M6281 Muscle weakness (generalized): Secondary | ICD-10-CM | POA: Insufficient documentation

## 2011-09-14 ENCOUNTER — Ambulatory Visit: Payer: Medicare Other | Admitting: Physical Therapy

## 2011-09-16 ENCOUNTER — Ambulatory Visit: Payer: Medicare Other | Admitting: Physical Therapy

## 2011-09-17 ENCOUNTER — Ambulatory Visit: Payer: Medicare Other | Admitting: Physical Therapy

## 2011-09-20 ENCOUNTER — Ambulatory Visit: Payer: Medicare Other | Admitting: Physical Therapy

## 2011-09-22 ENCOUNTER — Ambulatory Visit: Payer: Medicare Other | Admitting: Physical Therapy

## 2011-09-24 ENCOUNTER — Encounter: Payer: Medicare Other | Admitting: Physical Therapy

## 2011-09-27 ENCOUNTER — Ambulatory Visit: Payer: Medicare Other | Admitting: Physical Therapy

## 2011-09-29 ENCOUNTER — Ambulatory Visit: Payer: Medicare Other | Admitting: Physical Therapy

## 2011-10-01 ENCOUNTER — Encounter: Payer: Medicare Other | Admitting: Physical Therapy

## 2011-10-05 ENCOUNTER — Ambulatory Visit: Payer: Medicare Other | Attending: Neurosurgery | Admitting: Physical Therapy

## 2011-10-05 DIAGNOSIS — IMO0001 Reserved for inherently not codable concepts without codable children: Secondary | ICD-10-CM | POA: Insufficient documentation

## 2011-10-05 DIAGNOSIS — M545 Low back pain, unspecified: Secondary | ICD-10-CM | POA: Insufficient documentation

## 2011-10-05 DIAGNOSIS — M6281 Muscle weakness (generalized): Secondary | ICD-10-CM | POA: Insufficient documentation

## 2011-10-05 DIAGNOSIS — R262 Difficulty in walking, not elsewhere classified: Secondary | ICD-10-CM | POA: Insufficient documentation

## 2011-10-07 ENCOUNTER — Encounter: Payer: Medicare Other | Admitting: Physical Therapy

## 2011-11-02 ENCOUNTER — Ambulatory Visit (INDEPENDENT_AMBULATORY_CARE_PROVIDER_SITE_OTHER): Payer: Medicare Other | Admitting: *Deleted

## 2011-11-02 DIAGNOSIS — I10 Essential (primary) hypertension: Secondary | ICD-10-CM

## 2011-11-02 DIAGNOSIS — E785 Hyperlipidemia, unspecified: Secondary | ICD-10-CM

## 2011-11-02 LAB — HEPATIC FUNCTION PANEL
AST: 23 U/L (ref 0–37)
Albumin: 3.7 g/dL (ref 3.5–5.2)

## 2011-11-02 LAB — LIPID PANEL
HDL: 60.5 mg/dL (ref >39.00)
LDL Cholesterol: 89 mg/dL (ref 0–99)
Total CHOL/HDL Ratio: 3
Triglycerides: 93 mg/dL (ref 0.0–149.0)

## 2011-11-02 LAB — BASIC METABOLIC PANEL
CO2: 26 mEq/L (ref 19–32)
Chloride: 102 mEq/L (ref 96–112)
Potassium: 3.8 mEq/L (ref 3.5–5.1)
Sodium: 133 mEq/L — ABNORMAL LOW (ref 135–145)

## 2011-11-02 NOTE — Progress Notes (Signed)
Quick Note:  Please make copy of labs for patient visit. ______ 

## 2011-11-09 ENCOUNTER — Encounter: Payer: Self-pay | Admitting: Cardiology

## 2011-11-09 ENCOUNTER — Ambulatory Visit (INDEPENDENT_AMBULATORY_CARE_PROVIDER_SITE_OTHER): Payer: Medicare Other | Admitting: Cardiology

## 2011-11-09 VITALS — BP 130/68 | HR 54 | Ht 70.0 in | Wt 167.6 lb

## 2011-11-09 DIAGNOSIS — I509 Heart failure, unspecified: Secondary | ICD-10-CM

## 2011-11-09 DIAGNOSIS — M216X9 Other acquired deformities of unspecified foot: Secondary | ICD-10-CM

## 2011-11-09 DIAGNOSIS — M21371 Foot drop, right foot: Secondary | ICD-10-CM

## 2011-11-09 DIAGNOSIS — E7800 Pure hypercholesterolemia, unspecified: Secondary | ICD-10-CM

## 2011-11-09 DIAGNOSIS — I2510 Atherosclerotic heart disease of native coronary artery without angina pectoris: Secondary | ICD-10-CM

## 2011-11-09 DIAGNOSIS — E785 Hyperlipidemia, unspecified: Secondary | ICD-10-CM

## 2011-11-09 NOTE — Assessment & Plan Note (Signed)
The patient has a history of chronic diastolic congestive heart failure.  He is not having any active symptoms of CHF at this time.  No orthopnea or paroxysmal nocturnal dyspnea and he has only trace right ankle edema.

## 2011-11-09 NOTE — Progress Notes (Signed)
[name] Date of Birth:  [birth year] [facility] [address] [place], [place]  [address] [phone]         Fax   [phone]  History of Present Illness: This pleasant 78 year old gentleman is seen for a scheduled four-month followup office visit.  He has a history of extensive ischemic heart disease.  He had coronary artery bypass graft surgery in 1986.  He had a redo CABG in 1993.  He had a PTCA with stent in October of 2008.  He had a drug-eluting stent in February 2010.  He had a small non-Q MI in August 2010 he said history of congestive heart failure and COPD.  His last nuclear stress test was 10/27/09 showing a moderate area of reversible ischemia in the inferolateral wall with an ejection fraction of 64% and we aren't treating this medically.  No further cardiac interventions are planned.  His symptoms are well controlled with sublingual nitroglycerin.  Since her last saw him he had lumbosacral back surgery in early December.  Following the surgery he had a spinal fluid leak and had to be rehospitalized.  He did have angina postoperatively but none since.  Current Outpatient Prescriptions  Medication Sig Dispense Refill  . albuterol (PROVENTIL HFA;VENTOLIN HFA) 108 (90 BASE) MCG/ACT inhaler Inhale 2 puffs into the lungs every 6 (six) hours as needed. For shortness of breath.       [name] aspirin 325 MG tablet Take 325 mg by mouth daily.        . calcium-vitamin D (OSCAL WITH D 500-200) 500-200 MG-UNIT per tablet Take 1 tablet by mouth 2 (two) times daily.        . carbamazepine (CARBATROL) 300 MG 12 hr capsule Take 300 mg by mouth 2 (two) times daily.        . clopidogrel (PLAVIX) 75 MG tablet Take 75 mg by mouth daily.        . finasteride (PROSCAR) 5 MG tablet Take 5 mg by mouth daily.        . furosemide (LASIX) 20 MG tablet Take 20 mg by mouth every other day.        [name] glucosamine-chondroitin 500-400 MG tablet Take 1 tablet by mouth 2 (two) times daily.        . isosorbide mononitrate (IMDUR) 60 MG 24 hr tablet TAKE 1 TABLET BY MOUTH EVERY MORNING  30 tablet  6  . metoprolol (TOPROL-XL) 50 MG 24 hr tablet Take 50 mg by mouth daily.        . nitroGLYCERIN (NITROSTAT) 0.4 MG SL tablet Place 0.4 mg under the tongue every 5 (five) minutes as needed. For chest pain        . potassium chloride SA (K-DUR,KLOR-CON) 20 MEQ tablet Take 20 mEq by mouth every Monday, Wednesday, and Friday.       . ranitidine (ZANTAC) 150 MG tablet Take 1 tablet (150 mg total) by mouth daily.  90 tablet  3  . rosuvastatin (CRESTOR) 20 MG tablet Take 40 mg by mouth daily.        . sertraline (ZOLOFT) 100 MG tablet Take 150 mg by mouth daily.       . temazepam (RESTORIL) 15 MG capsule Take 1 capsule (15 mg total) by mouth at bedtime as needed for sleep.  30 capsule  5    No Known Allergies  Patient Active Problem List  Diagnoses  . HYPERLIPIDEMIA  . PERIPHERAL NEUROPATHY  . HYPERTENSION  . Cor Athrscl-Uns  Vessel  . GERD  . HERNIA  . PSORIASIS  . OSTEOARTHRITIS  . OSTEOPOROSIS  . PROSTATE CANCER, HX OF  . Ischemic heart disease  . Myocardial infarction  . CHF (congestive heart failure)  . Hypoxia  . COPD (chronic obstructive pulmonary disease)  . Peripheral neuropathy  . Dizziness  . Dyslipidemia (high LDL; low HDL)  . LBBB (left bundle branch block)  . Hx of CABG  . Foot drop, right    History  Smoking status  . Never Smoker   Smokeless tobacco  . Not on file    History  Alcohol Use No    Family History  Problem Relation Age of Onset  . Heart attack Mother   . Addison's disease Father   . Heart attack Sister   . Anesthesia problems Neg Hx   . Hypotension Neg Hx   . Malignant hyperthermia Neg Hx   . Pseudochol deficiency Neg Hx     Review of Systems: Constitutional: no fever chills diaphoresis or fatigue or change in weight.  Head and neck: no hearing loss, no epistaxis, no photophobia or visual disturbance. Respiratory: No cough, shortness  of breath or wheezing. Cardiovascular: No chest pain peripheral edema, palpitations. Gastrointestinal: No abdominal distention, no abdominal pain, no change in bowel habits hematochezia or melena. Genitourinary: No dysuria, no frequency, no urgency, no nocturia. Musculoskeletal:No arthralgias, no back pain, no gait disturbance or myalgias. Neurological: No dizziness, no headaches, no numbness, no seizures, no syncope, no weakness, no tremors. Hematologic: No lymphadenopathy, no easy bruising. Psychiatric: No confusion, no hallucinations, no sleep disturbance.    Physical Exam: Filed Vitals:   11/09/11 0950  BP: 130/68  Pulse: 54   the general appearance feels a well-developed well-nourished elderly gentleman in no distress.Pupils equal and reactive.   Extraocular Movements are full.  There is no scleral icterus.  The mouth and pharynx are normal.  The neck is supple.  The carotids reveal no bruits.  The jugular venous pressure is normal.  The thyroid is not enlarged.  There is no lymphadenopathy.  The chest is clear to percussion and auscultation. There are no rales or rhonchi. Expansion of the chest is symmetrical.  The precordium is quiet.  The first heart sound is normal.  The second heart sound is physiologically split.  There is no murmur gallop rub or click.  There is no abnormal lift or heave.  The abdomen is soft and nontender. Bowel sounds are normal. The liver and spleen are not enlarged. There Are no abdominal masses. There are no bruits.  Extremities reveal right foot drop and trace right ankle edema.The skin is warm and dry.  There is no rash. Strength is normal and symmetrical in all extremities.  There is no lateralizing weakness.  There are no sensory deficits.     Assessment / Plan:  continue same medication.  Recheck in 4 months for office visit and he'll get fasting lab work ahead of time

## 2011-11-09 NOTE — Assessment & Plan Note (Signed)
Patient has a history of dyslipidemia.  He remains on Crestor 20 mg daily which he is tolerating

## 2011-11-09 NOTE — Assessment & Plan Note (Signed)
The patient continues to have a right foot drop postoperatively.  He wears a foot brace and uses a rolling walker.  He is able to get exercise by walking up a hill in his neighborhood.  He has not been experiencing any recurrent angina with walking uphill.

## 2011-11-09 NOTE — Patient Instructions (Signed)
Your physician recommends that you continue on your current medications as directed. Please refer to the Current Medication list given to you today. Your physician wants you to follow-up in: 4 months You will receive a reminder letter in the mail two months in advance. If you don't receive a letter, please call our office to schedule the follow-up appointment.  

## 2012-01-05 ENCOUNTER — Other Ambulatory Visit: Payer: Self-pay | Admitting: *Deleted

## 2012-01-05 DIAGNOSIS — G4700 Insomnia, unspecified: Secondary | ICD-10-CM

## 2012-01-12 MED ORDER — TEMAZEPAM 15 MG PO CAPS: 15.0000 mg | ORAL_CAPSULE | Freq: Every evening | ORAL | Status: DC | PRN

## 2012-02-01 ENCOUNTER — Other Ambulatory Visit: Payer: Self-pay | Admitting: Cardiology

## 2012-02-02 NOTE — Telephone Encounter (Signed)
Refilled isosorbide  

## 2012-03-06 ENCOUNTER — Other Ambulatory Visit (INDEPENDENT_AMBULATORY_CARE_PROVIDER_SITE_OTHER): Payer: Medicare Other

## 2012-03-06 DIAGNOSIS — I2510 Atherosclerotic heart disease of native coronary artery without angina pectoris: Secondary | ICD-10-CM

## 2012-03-06 DIAGNOSIS — E7800 Pure hypercholesterolemia, unspecified: Secondary | ICD-10-CM

## 2012-03-06 LAB — BASIC METABOLIC PANEL
BUN: 16 mg/dL (ref 6–23)
Calcium: 9.2 mg/dL (ref 8.4–10.5)
Creatinine, Ser: 1 mg/dL (ref 0.4–1.5)
GFR: 78.18 mL/min (ref >60.00)
Potassium: 4.3 mEq/L (ref 3.5–5.1)

## 2012-03-06 LAB — HEPATIC FUNCTION PANEL
ALT: 12 U/L (ref 0–53)
Bilirubin, Direct: 0 mg/dL (ref 0.0–0.3)
Total Protein: 7.1 g/dL (ref 6.0–8.3)

## 2012-03-06 LAB — LIPID PANEL
Cholesterol: 179 mg/dL (ref 0–200)
HDL: 67.6 mg/dL (ref >39.00)
VLDL: 17.4 mg/dL (ref 0.0–40.0)

## 2012-03-06 NOTE — Progress Notes (Signed)
Quick Note:  Please make copy of labs for patient visit. ______ 

## 2012-03-08 ENCOUNTER — Encounter: Payer: Self-pay | Admitting: Cardiology

## 2012-03-08 ENCOUNTER — Ambulatory Visit (INDEPENDENT_AMBULATORY_CARE_PROVIDER_SITE_OTHER): Payer: Medicare Other | Admitting: Cardiology

## 2012-03-08 ENCOUNTER — Other Ambulatory Visit: Payer: Self-pay | Admitting: *Deleted

## 2012-03-08 VITALS — BP 128/70 | HR 60 | Ht 70.0 in | Wt 169.0 lb

## 2012-03-08 DIAGNOSIS — I10 Essential (primary) hypertension: Secondary | ICD-10-CM

## 2012-03-08 DIAGNOSIS — E785 Hyperlipidemia, unspecified: Secondary | ICD-10-CM

## 2012-03-08 DIAGNOSIS — I2510 Atherosclerotic heart disease of native coronary artery without angina pectoris: Secondary | ICD-10-CM

## 2012-03-08 DIAGNOSIS — I259 Chronic ischemic heart disease, unspecified: Secondary | ICD-10-CM

## 2012-03-08 DIAGNOSIS — E7800 Pure hypercholesterolemia, unspecified: Secondary | ICD-10-CM

## 2012-03-08 DIAGNOSIS — M6281 Muscle weakness (generalized): Secondary | ICD-10-CM

## 2012-03-08 NOTE — Assessment & Plan Note (Signed)
The patient walks each day for exercise.  He tries to walk when the temperature is less than 80 outside.  Normally partway through his walk he will have to take 1 or 2 sublingual nitroglycerin which relieved his chest pain.

## 2012-03-08 NOTE — Progress Notes (Signed)
[name] Date of Birth:  [birth year] [facility] [address] [place], [place]  [address] [phone]         Fax   [phone]  History of Present Illness: This pleasant 78 year old gentleman is seen for a scheduled four-month followup visit.  He has a history of extensive ischemic heart disease.  He had coronary artery bypass graft surgery in 1986.  He had a redo CABG in 1993.  He had PTCA with stent in October 2008.  He had catheter with a drug-eluting stent in February 2010.  He had a small non-Q MI in August 2010.  His last nuclear stress test was 10/27/09 showing a moderate area of reversible ischemia in the inferolateral wall with an ejection fraction of 64% he does not have good target vessels for any additional interventions and we are treating him medically.  Symptoms are responsive to sublingual nitroglycerin.  The patient also has a history of diastolic congestive heart failure and a history of COPD.  The patient has had chronic back pain and has had lumbosacral surgery in December 2012.  Following the surgery he had a spinal fluid leak and had to be rehospitalized.  He is now seeing a chiropractor in [facility] for his back.  Patient is also followed at the [facility] in [place] and was seen yesterday.  [facility] was complaining of some weakness [facility] results are pending.  Current Outpatient Prescriptions  Medication Sig Dispense Refill  . albuterol (PROVENTIL HFA;VENTOLIN HFA) 108 (90 BASE) MCG/ACT inhaler Inhale 2 puffs into the lungs every 6 (six) hours as needed. For shortness of breath.       [name] aspirin 325 MG tablet Take 325 mg by mouth daily.        . calcium-vitamin D (OSCAL WITH D 500-200) 500-200 MG-UNIT per tablet Take 1 tablet by mouth 2 (two) times daily.        . carbamazepine (CARBATROL) 300 MG 12 hr capsule Take 300 mg by mouth 2 (two) times daily.        . clopidogrel (PLAVIX) 75 MG tablet Take 75 mg by mouth  daily.        . finasteride (PROSCAR) 5 MG tablet Take 5 mg by mouth daily.        . furosemide (LASIX) 20 MG tablet Take 20 mg by mouth every other day.        [name] glucosamine-chondroitin 500-400 MG tablet Take 1 tablet by mouth 2 (two) times daily.      . isosorbide mononitrate (IMDUR) 60 MG 24 hr tablet TAKE 1 TABLET BY MOUTH EVERY MORNING  30 tablet  6  . metoprolol (TOPROL-XL) 50 MG 24 hr tablet Take 50 mg by mouth daily.        . nitroGLYCERIN (NITROSTAT) 0.4 MG SL tablet Place 0.4 mg under the tongue every 5 (five) minutes as needed. For chest pain        . potassium chloride SA (K-DUR,KLOR-CON) 20 MEQ tablet Take 20 mEq by mouth every Monday, Wednesday, and Friday.       . ranitidine (ZANTAC) 150 MG tablet Take 1 tablet (150 mg total) by mouth daily.  90 tablet  3  . rosuvastatin (CRESTOR) 40 MG tablet Take 40 mg by mouth daily.      . sertraline (ZOLOFT) 100 MG tablet Take 150 mg by mouth daily.       . temazepam (RESTORIL) 15 MG capsule Take 1  capsule (15 mg total) by mouth at bedtime as needed for sleep.  30 capsule  2    No Known Allergies  Patient Active Problem List  Diagnosis  . HYPERLIPIDEMIA  . PERIPHERAL NEUROPATHY  . HYPERTENSION  . Cor Athrscl-Uns Vessel  . GERD  . HERNIA  . PSORIASIS  . OSTEOARTHRITIS  . OSTEOPOROSIS  . PROSTATE CANCER, HX OF  . Ischemic heart disease  . Myocardial infarction  . CHF (congestive heart failure)  . Hypoxia  . COPD (chronic obstructive pulmonary disease)  . Peripheral neuropathy  . Dizziness  . Dyslipidemia (high LDL; low HDL)  . LBBB (left bundle branch block)  . Hx of CABG  . Foot drop, right    History  Smoking status  . Never Smoker   Smokeless tobacco  . Not on file    History  Alcohol Use No    Family History  Problem Relation Age of Onset  . Heart attack Mother   . Addison's disease Father   . Heart attack Sister   . Anesthesia problems Neg Hx   . Hypotension Neg Hx   . Malignant hyperthermia Neg Hx     . Pseudochol deficiency Neg Hx     Review of Systems: Constitutional: no fever chills diaphoresis or fatigue or change in weight.  Head and neck: no hearing loss, no epistaxis, no photophobia or visual disturbance. Respiratory: No cough, shortness of breath or wheezing. Cardiovascular: No chest pain peripheral edema, palpitations. Gastrointestinal: No abdominal distention, no abdominal pain, no change in bowel habits hematochezia or melena. Genitourinary: No dysuria, no frequency, no urgency, no nocturia. Musculoskeletal:No arthralgias, no back pain, no gait disturbance or myalgias. Neurological: No dizziness, no headaches, no numbness, no seizures, no syncope, no weakness, no tremors. Hematologic: No lymphadenopathy, no easy bruising. Psychiatric: No confusion, no hallucinations, no sleep disturbance.    Physical Exam: Filed Vitals:   03/08/12 0903  BP: 128/70  Pulse: 60   the general appearance reveals a well-developed well-nourished elderly gentleman in no distress.  He has gained 2 pounds since last visit.The head and neck exam reveals pupils equal and reactive.  Extraocular movements are full.  There is no scleral icterus.  The mouth and pharynx are normal.  The neck is supple.  The carotids reveal no bruits.  The jugular venous pressure is normal.  The  thyroid is not enlarged.  There is no lymphadenopathy.  The chest is clear to percussion and auscultation.  There are no rales or rhonchi.  Expansion of the chest is symmetrical.  The precordium is quiet.  The first heart sound is normal.  The second heart sound is physiologically split.  There is no murmur gallop rub or click.  There is no abnormal lift or heave.  The abdomen is soft and nontender.  The bowel sounds are normal.  The liver and spleen are not enlarged.  There are no abdominal masses.  There are no abdominal bruits.  Extremities reveal good pedal pulses.  There is no phlebitis or edema.  There is no cyanosis or clubbing.   Strength is normal and symmetrical in all extremities.  There is no lateralizing weakness.  There are no sensory deficits.  The skin is warm and dry.  There is no rash.     Assessment / Plan: The patient is to continue same medication.  Recheck in 4 months for followup office visit lipid panel hepatic function panel nasal metabolic panel and total CK to evaluate muscle weakness.

## 2012-03-08 NOTE — Assessment & Plan Note (Signed)
The blood pressure is stable on current therapy.  He is not having any active symptoms of congestive heart failure at this time.  No dizziness or syncope no awareness of tachycardia palpitations

## 2012-03-08 NOTE — Assessment & Plan Note (Signed)
The patient is on high-dose Crestor 40 mg daily.  He does complain of some weakness which is probably multifactorial.  Is not having any muscle tenderness.  His lipids are satisfactory on this dose.

## 2012-03-08 NOTE — Patient Instructions (Signed)
Your physician recommends that you continue on your current medications as directed. Please refer to the Current Medication list given to you today.  Your physician recommends that you schedule a follow-up appointment in: 4 months with fasting labs (lp/bmet/hfp)  

## 2012-03-13 ENCOUNTER — Other Ambulatory Visit: Payer: Self-pay | Admitting: Cardiology

## 2012-03-13 NOTE — Telephone Encounter (Signed)
Fax Received. Refill Completed. Tullio Chausse Chowoe (R.M.A)   

## 2012-04-05 ENCOUNTER — Other Ambulatory Visit: Payer: Self-pay | Admitting: Cardiology

## 2012-04-05 ENCOUNTER — Other Ambulatory Visit: Payer: Self-pay | Admitting: Dermatology

## 2012-04-05 DIAGNOSIS — G4700 Insomnia, unspecified: Secondary | ICD-10-CM

## 2012-04-05 MED ORDER — TEMAZEPAM 15 MG PO CAPS: 15.0000 mg | ORAL_CAPSULE | Freq: Every evening | ORAL | Status: DC | PRN

## 2012-04-05 NOTE — Telephone Encounter (Signed)
Refilled temazepam and notified patient

## 2012-04-05 NOTE — Telephone Encounter (Signed)
Pt has been trying to get his refill since the weekend and he is almost out of medication pls let him know when this is done

## 2012-05-25 ENCOUNTER — Other Ambulatory Visit: Payer: Self-pay | Admitting: Cardiology

## 2012-05-25 NOTE — Telephone Encounter (Signed)
Refilled zantac.

## 2012-05-26 ENCOUNTER — Ambulatory Visit: Payer: Medicare Other

## 2012-05-26 ENCOUNTER — Ambulatory Visit (INDEPENDENT_AMBULATORY_CARE_PROVIDER_SITE_OTHER): Payer: Medicare Other

## 2012-05-26 DIAGNOSIS — Z23 Encounter for immunization: Secondary | ICD-10-CM

## 2012-06-22 ENCOUNTER — Other Ambulatory Visit: Payer: Self-pay | Admitting: *Deleted

## 2012-06-22 DIAGNOSIS — G4700 Insomnia, unspecified: Secondary | ICD-10-CM

## 2012-06-25 MED ORDER — TEMAZEPAM 15 MG PO CAPS: 15.0000 mg | ORAL_CAPSULE | Freq: Every evening | ORAL | Status: DC | PRN

## 2012-06-26 ENCOUNTER — Telehealth: Payer: Self-pay | Admitting: Family Medicine

## 2012-06-26 NOTE — Telephone Encounter (Signed)
Also we do have a new physician that they might be interested in Dr. Selena Batten

## 2012-06-26 NOTE — Telephone Encounter (Signed)
Pt called and is req a call back from Dr Tawanna Cooler re: personal matter. Pls call.

## 2012-06-26 NOTE — Telephone Encounter (Signed)
Explain,,,,,,,,,,,,,i am  not taking any new patients.

## 2012-06-26 NOTE — Telephone Encounter (Signed)
Patient would like to know if you could possibly take his sister as a new patient.  She will have [employer] medicare in a few months.

## 2012-06-27 NOTE — Telephone Encounter (Signed)
Spoke with wife

## 2012-06-30 DIAGNOSIS — I472 Ventricular tachycardia, unspecified: Secondary | ICD-10-CM

## 2012-06-30 HISTORY — DX: Ventricular tachycardia: I47.2

## 2012-06-30 HISTORY — DX: Ventricular tachycardia, unspecified: I47.20

## 2012-07-04 ENCOUNTER — Other Ambulatory Visit (INDEPENDENT_AMBULATORY_CARE_PROVIDER_SITE_OTHER): Payer: Medicare Other

## 2012-07-04 DIAGNOSIS — M6281 Muscle weakness (generalized): Secondary | ICD-10-CM

## 2012-07-04 DIAGNOSIS — E7800 Pure hypercholesterolemia, unspecified: Secondary | ICD-10-CM

## 2012-07-04 LAB — BASIC METABOLIC PANEL
BUN: 21 mg/dL (ref 6–23)
Calcium: 9.3 mg/dL (ref 8.4–10.5)
Creatinine, Ser: 0.9 mg/dL (ref 0.4–1.5)
GFR: 85.17 mL/min (ref >60.00)

## 2012-07-04 LAB — LIPID PANEL
Cholesterol: 181 mg/dL (ref 0–200)
VLDL: 17.8 mg/dL (ref 0.0–40.0)

## 2012-07-04 LAB — HEPATIC FUNCTION PANEL
ALT: 14 U/L (ref 0–53)
AST: 26 U/L (ref 0–37)
Alkaline Phosphatase: 67 U/L (ref 39–117)
Bilirubin, Direct: 0.1 mg/dL (ref 0.0–0.3)
Total Protein: 6.8 g/dL (ref 6.0–8.3)

## 2012-07-04 LAB — CK: Total CK: 135 U/L (ref 7–232)

## 2012-07-04 NOTE — Progress Notes (Signed)
Quick Note:  Please make copy of labs for patient visit. ______ 

## 2012-07-06 ENCOUNTER — Ambulatory Visit (INDEPENDENT_AMBULATORY_CARE_PROVIDER_SITE_OTHER): Payer: Medicare Other | Admitting: Cardiology

## 2012-07-06 ENCOUNTER — Encounter: Payer: Self-pay | Admitting: Cardiology

## 2012-07-06 VITALS — BP 130/62 | HR 61 | Resp 18 | Ht 70.0 in | Wt 169.1 lb

## 2012-07-06 DIAGNOSIS — M1990 Unspecified osteoarthritis, unspecified site: Secondary | ICD-10-CM

## 2012-07-06 DIAGNOSIS — E785 Hyperlipidemia, unspecified: Secondary | ICD-10-CM

## 2012-07-06 DIAGNOSIS — I259 Chronic ischemic heart disease, unspecified: Secondary | ICD-10-CM

## 2012-07-06 NOTE — Patient Instructions (Addendum)
Your physician recommends that you continue on your current medications as directed. Please refer to the Current Medication list given to you today. Your physician recommends that you schedule a follow-up appointment in: 4 months with fasting labs (lp/bmet/hfp) and ekg  

## 2012-07-06 NOTE — Assessment & Plan Note (Signed)
The patient continues to be disabled by his generalized osteoarthritis.  He walks with a walker.  He will continue to use Tylenol on a when necessary basis

## 2012-07-06 NOTE — Addendum Note (Signed)
Addended by: Regis Bill B on: 07/06/2012 10:10 AM   Modules accepted: Orders

## 2012-07-06 NOTE — Progress Notes (Signed)
[name] Date of Birth:  April 16, 1925 [facility] [address] [place], [place]  [address] [phone]         Fax   [phone]  History of Present Illness:  This pleasant 78 year old gentleman is seen for a scheduled four-month followup visit. He has a history of extensive ischemic heart disease. He had coronary artery bypass graft surgery in 1986. He had a redo CABG in 1993. He had PTCA with stent in October 2008. He had catheter with a drug-eluting stent in February 2010. He had a small non-Q MI in August 2010. His last nuclear stress test was 10/27/09 showing a moderate area of reversible ischemia in the inferolateral wall with an ejection fraction of 64% he does not have good target vessels for any additional interventions and we are treating him medically. Symptoms are responsive to sublingual nitroglycerin. The patient also has a history of diastolic congestive heart failure and a history of COPD. The patient has had chronic back pain and has had lumbosacral surgery in December 2012. Following the surgery he had a spinal fluid leak and had to be rehospitalized.  He has had a lot of osteoarthritis.  He is on high-dose Crestor but his CK levels are normal.  Current Outpatient Prescriptions  Medication Sig Dispense Refill  . albuterol (PROVENTIL HFA;VENTOLIN HFA) 108 (90 BASE) MCG/ACT inhaler Inhale 2 puffs into the lungs every 6 (six) hours as needed. For shortness of breath.       [name] aspirin 325 MG tablet Take 325 mg by mouth daily.        . calcium-vitamin D (OSCAL WITH D 500-200) 500-200 MG-UNIT per tablet Take 1 tablet by mouth 2 (two) times daily.        . carbamazepine (CARBATROL) 300 MG 12 hr capsule Take 300 mg by mouth 2 (two) times daily.        . clopidogrel (PLAVIX) 75 MG tablet Take 75 mg by mouth daily.        . finasteride (PROSCAR) 5 MG tablet Take 5 mg by mouth daily.        . furosemide (LASIX) 20 MG tablet Take 20 mg by mouth every other day.         [name] glucosamine-chondroitin 500-400 MG tablet Take 1 tablet by mouth 2 (two) times daily.      . isosorbide mononitrate (IMDUR) 60 MG 24 hr tablet TAKE 1 TABLET BY MOUTH EVERY MORNING  30 tablet  6  . metoprolol (TOPROL-XL) 50 MG 24 hr tablet Take 50 mg by mouth daily.        [name] NITROSTAT 0.4 MG SL tablet PLACE 1 TABLET UNDER THE TONGUE EVERY 5 MINUTES ASDIRECTED BY PHYSICIAN  25 tablet  11  . potassium chloride SA (K-DUR,KLOR-CON) 20 MEQ tablet Take 20 mEq by mouth every Monday, Wednesday, and Friday.       . ranitidine (ZANTAC) 150 MG tablet TAKE 1 TABLET BY MOUTH ONCE A DAY  90 tablet  3  . rosuvastatin (CRESTOR) 40 MG tablet Take 40 mg by mouth daily.      . sertraline (ZOLOFT) 100 MG tablet Take 150 mg by mouth daily.       . temazepam (RESTORIL) 15 MG capsule Take 1 capsule (15 mg total) by mouth at bedtime as needed for sleep.  30 capsule  5    No Known Allergies  Patient Active Problem List  Diagnosis  . HYPERLIPIDEMIA  . PERIPHERAL NEUROPATHY  . HYPERTENSION  .  Cor Athrscl-Uns Vessel  . GERD  . HERNIA  . PSORIASIS  . OSTEOARTHRITIS  . OSTEOPOROSIS  . PROSTATE CANCER, HX OF  . Ischemic heart disease  . Myocardial infarction  . CHF (congestive heart failure)  . Hypoxia  . COPD (chronic obstructive pulmonary disease)  . Peripheral neuropathy  . Dizziness  . Dyslipidemia (high LDL; low HDL)  . LBBB (left bundle branch block)  . Hx of CABG  . Foot drop, right    History  Smoking status  . Never Smoker   Smokeless tobacco  . Not on file    History  Alcohol Use No    Family History  Problem Relation Age of Onset  . Heart attack Mother   . Addison's disease Father   . Heart attack Sister   . Anesthesia problems Neg Hx   . Hypotension Neg Hx   . Malignant hyperthermia Neg Hx   . Pseudochol deficiency Neg Hx     Review of Systems: Constitutional: no fever chills diaphoresis or fatigue or change in weight.  Head and neck: no hearing loss, no epistaxis, no  photophobia or visual disturbance. Respiratory: No cough, shortness of breath or wheezing. Cardiovascular: No chest pain peripheral edema, palpitations. Gastrointestinal: No abdominal distention, no abdominal pain, no change in bowel habits hematochezia or melena. Genitourinary: No dysuria, no frequency, no urgency, no nocturia. Musculoskeletal:No arthralgias, no back pain, no gait disturbance or myalgias. Neurological: No dizziness, no headaches, no numbness, no seizures, no syncope, no weakness, no tremors. Hematologic: No lymphadenopathy, no easy bruising. Psychiatric: No confusion, no hallucinations, no sleep disturbance.    Physical Exam: Filed Vitals:   07/06/12 0853  BP: 130/62  Pulse: 61  Resp: 18   the general appearance reveals an elderly gentleman who is using a rolling walker.The head and neck exam reveals pupils equal and reactive.  Extraocular movements are full.  There is no scleral icterus.  The mouth and pharynx are normal.  The neck is supple.  The carotids reveal no bruits.  The jugular venous pressure is normal.  The  thyroid is not enlarged.  There is no lymphadenopathy.  The chest is clear to percussion and auscultation.  There are no rales or rhonchi.  Expansion of the chest is symmetrical.  The precordium is quiet.  The first heart sound is normal.  The second heart sound is physiologically split.  There is no murmur gallop rub or click.  There is no abnormal lift or heave.  The abdomen is soft and nontender.  The bowel sounds are normal.  The liver and spleen are not enlarged.  There are no abdominal masses.  There are no abdominal bruits.  Extremities reveal good pedal pulses.  There is no phlebitis or edema.  There is no cyanosis or clubbing.  Strength is normal and symmetrical in all extremities.  There is no lateralizing weakness.  There are no sensory deficits.  The skin is warm and dry.  There is no rash.     Assessment / Plan: The patient is to continue same  medication.  Recheck in 4 months for followup office visit EKG lipid panel hepatic function panel and basal metabolic panel

## 2012-07-06 NOTE — Assessment & Plan Note (Signed)
Blood work is satisfactory on current dose of Crestor 40 mg daily.  He is not having any myalgias and his CK level and liver function studies are normal

## 2012-07-06 NOTE — Assessment & Plan Note (Signed)
The patient continues to have occasional chest pain which is responsive to sublingual nitroglycerin.  There has been no pattern of an increased chest pain over the past 4 months

## 2012-07-15 ENCOUNTER — Encounter (HOSPITAL_COMMUNITY): Payer: Self-pay | Admitting: Emergency Medicine

## 2012-07-15 ENCOUNTER — Ambulatory Visit (HOSPITAL_COMMUNITY): Admit: 2012-07-15 | Payer: Self-pay | Admitting: Interventional Cardiology

## 2012-07-15 ENCOUNTER — Encounter (HOSPITAL_COMMUNITY)
Admission: EM | Disposition: A | Discharge status: Home / Self Care | Payer: Self-pay | Source: Home / Self Care | Attending: Cardiology

## 2012-07-15 ENCOUNTER — Inpatient Hospital Stay (HOSPITAL_COMMUNITY)
Admission: EM | Admit: 2012-07-15 | Discharge: 2012-07-22 | DRG: 248 | Disposition: A | Discharge status: Home / Self Care | Payer: Medicare Other | Attending: Cardiology | Admitting: Cardiology

## 2012-07-15 ENCOUNTER — Emergency Department (HOSPITAL_COMMUNITY): Payer: Medicare Other

## 2012-07-15 ENCOUNTER — Inpatient Hospital Stay (HOSPITAL_COMMUNITY): Payer: Medicare Other

## 2012-07-15 DIAGNOSIS — R7302 Impaired glucose tolerance (oral): Secondary | ICD-10-CM | POA: Diagnosis present

## 2012-07-15 DIAGNOSIS — I2589 Other forms of chronic ischemic heart disease: Secondary | ICD-10-CM | POA: Diagnosis present

## 2012-07-15 DIAGNOSIS — I214 Non-ST elevation (NSTEMI) myocardial infarction: Principal | ICD-10-CM | POA: Diagnosis present

## 2012-07-15 DIAGNOSIS — J45909 Unspecified asthma, uncomplicated: Secondary | ICD-10-CM | POA: Diagnosis present

## 2012-07-15 DIAGNOSIS — I2109 ST elevation (STEMI) myocardial infarction involving other coronary artery of anterior wall: Secondary | ICD-10-CM | POA: Diagnosis present

## 2012-07-15 DIAGNOSIS — F3289 Other specified depressive episodes: Secondary | ICD-10-CM | POA: Diagnosis present

## 2012-07-15 DIAGNOSIS — Z8546 Personal history of malignant neoplasm of prostate: Secondary | ICD-10-CM

## 2012-07-15 DIAGNOSIS — K449 Diaphragmatic hernia without obstruction or gangrene: Secondary | ICD-10-CM | POA: Diagnosis present

## 2012-07-15 DIAGNOSIS — S0101XA Laceration without foreign body of scalp, initial encounter: Secondary | ICD-10-CM

## 2012-07-15 DIAGNOSIS — Z955 Presence of coronary angioplasty implant and graft: Secondary | ICD-10-CM

## 2012-07-15 DIAGNOSIS — D649 Anemia, unspecified: Secondary | ICD-10-CM | POA: Diagnosis present

## 2012-07-15 DIAGNOSIS — I252 Old myocardial infarction: Secondary | ICD-10-CM

## 2012-07-15 DIAGNOSIS — Z96659 Presence of unspecified artificial knee joint: Secondary | ICD-10-CM

## 2012-07-15 DIAGNOSIS — Z7902 Long term (current) use of antithrombotics/antiplatelets: Secondary | ICD-10-CM

## 2012-07-15 DIAGNOSIS — A419 Sepsis, unspecified organism: Secondary | ICD-10-CM | POA: Diagnosis present

## 2012-07-15 DIAGNOSIS — E785 Hyperlipidemia, unspecified: Secondary | ICD-10-CM | POA: Diagnosis present

## 2012-07-15 DIAGNOSIS — I509 Heart failure, unspecified: Secondary | ICD-10-CM | POA: Diagnosis present

## 2012-07-15 DIAGNOSIS — N419 Inflammatory disease of prostate, unspecified: Secondary | ICD-10-CM | POA: Diagnosis present

## 2012-07-15 DIAGNOSIS — A4151 Sepsis due to Escherichia coli [E. coli]: Secondary | ICD-10-CM | POA: Diagnosis present

## 2012-07-15 DIAGNOSIS — Z7982 Long term (current) use of aspirin: Secondary | ICD-10-CM

## 2012-07-15 DIAGNOSIS — R7881 Bacteremia: Secondary | ICD-10-CM

## 2012-07-15 DIAGNOSIS — R6510 Systemic inflammatory response syndrome (SIRS) of non-infectious origin without acute organ dysfunction: Secondary | ICD-10-CM

## 2012-07-15 DIAGNOSIS — W19XXXA Unspecified fall, initial encounter: Secondary | ICD-10-CM | POA: Diagnosis present

## 2012-07-15 DIAGNOSIS — G609 Hereditary and idiopathic neuropathy, unspecified: Secondary | ICD-10-CM | POA: Diagnosis present

## 2012-07-15 DIAGNOSIS — I2510 Atherosclerotic heart disease of native coronary artery without angina pectoris: Secondary | ICD-10-CM | POA: Diagnosis present

## 2012-07-15 DIAGNOSIS — F329 Major depressive disorder, single episode, unspecified: Secondary | ICD-10-CM | POA: Diagnosis present

## 2012-07-15 DIAGNOSIS — N390 Urinary tract infection, site not specified: Secondary | ICD-10-CM | POA: Diagnosis present

## 2012-07-15 DIAGNOSIS — I213 ST elevation (STEMI) myocardial infarction of unspecified site: Secondary | ICD-10-CM

## 2012-07-15 DIAGNOSIS — I10 Essential (primary) hypertension: Secondary | ICD-10-CM | POA: Diagnosis present

## 2012-07-15 DIAGNOSIS — S0191XA Laceration without foreign body of unspecified part of head, initial encounter: Secondary | ICD-10-CM | POA: Diagnosis present

## 2012-07-15 DIAGNOSIS — Z951 Presence of aortocoronary bypass graft: Secondary | ICD-10-CM

## 2012-07-15 DIAGNOSIS — S0990XA Unspecified injury of head, initial encounter: Secondary | ICD-10-CM

## 2012-07-15 DIAGNOSIS — B9620 Unspecified Escherichia coli [E. coli] as the cause of diseases classified elsewhere: Secondary | ICD-10-CM

## 2012-07-15 DIAGNOSIS — J4489 Other specified chronic obstructive pulmonary disease: Secondary | ICD-10-CM | POA: Diagnosis present

## 2012-07-15 DIAGNOSIS — K219 Gastro-esophageal reflux disease without esophagitis: Secondary | ICD-10-CM | POA: Diagnosis present

## 2012-07-15 DIAGNOSIS — I472 Ventricular tachycardia, unspecified: Secondary | ICD-10-CM | POA: Diagnosis present

## 2012-07-15 DIAGNOSIS — E876 Hypokalemia: Secondary | ICD-10-CM | POA: Diagnosis present

## 2012-07-15 DIAGNOSIS — S0100XA Unspecified open wound of scalp, initial encounter: Secondary | ICD-10-CM | POA: Diagnosis present

## 2012-07-15 DIAGNOSIS — D72829 Elevated white blood cell count, unspecified: Secondary | ICD-10-CM | POA: Diagnosis present

## 2012-07-15 DIAGNOSIS — I5023 Acute on chronic systolic (congestive) heart failure: Secondary | ICD-10-CM | POA: Diagnosis present

## 2012-07-15 DIAGNOSIS — Z9861 Coronary angioplasty status: Secondary | ICD-10-CM

## 2012-07-15 DIAGNOSIS — I5021 Acute systolic (congestive) heart failure: Secondary | ICD-10-CM | POA: Diagnosis present

## 2012-07-15 DIAGNOSIS — J449 Chronic obstructive pulmonary disease, unspecified: Secondary | ICD-10-CM | POA: Diagnosis present

## 2012-07-15 DIAGNOSIS — I4891 Unspecified atrial fibrillation: Secondary | ICD-10-CM | POA: Diagnosis present

## 2012-07-15 DIAGNOSIS — I4729 Other ventricular tachycardia: Secondary | ICD-10-CM | POA: Diagnosis present

## 2012-07-15 DIAGNOSIS — R079 Chest pain, unspecified: Secondary | ICD-10-CM

## 2012-07-15 HISTORY — DX: Anemia, unspecified: D64.9

## 2012-07-15 HISTORY — DX: Bacteremia: R78.81

## 2012-07-15 HISTORY — PX: PERCUTANEOUS CORONARY STENT INTERVENTION (PCI-S): SHX5485

## 2012-07-15 HISTORY — DX: Ventricular tachycardia: I47.2

## 2012-07-15 HISTORY — PX: LEFT AND RIGHT HEART CATHETERIZATION WITH CORONARY ANGIOGRAM: SHX5449

## 2012-07-15 HISTORY — DX: Unspecified systolic (congestive) heart failure: I50.20

## 2012-07-15 LAB — CBC WITH DIFFERENTIAL/PLATELET
Basophils Absolute: 0 10*3/uL (ref 0.0–0.1)
Basophils Absolute: 0 10*3/uL (ref 0.0–0.1)
Basophils Relative: 0 % (ref 0–1)
Eosinophils Relative: 0 % (ref 0–5)
Eosinophils Relative: 0 % (ref 0–5)
HCT: 36.8 % — ABNORMAL LOW (ref 39.0–52.0)
HCT: 33.9 % — ABNORMAL LOW (ref 39.0–52.0)
Lymphocytes Relative: 2 % — ABNORMAL LOW (ref 12–46)
Lymphs Abs: 0.3 10*3/uL — ABNORMAL LOW (ref 0.7–4.0)
MCHC: 34.5 g/dL (ref 30.0–36.0)
MCV: 97.4 fL (ref 78.0–100.0)
MCV: 97.4 fL (ref 78.0–100.0)
Monocytes Absolute: 1 10*3/uL (ref 0.1–1.0)
Monocytes Absolute: 0.2 10*3/uL (ref 0.1–1.0)
Neutro Abs: 8.1 10*3/uL — ABNORMAL HIGH (ref 1.7–7.7)
Neutro Abs: 12.5 10*3/uL — ABNORMAL HIGH (ref 1.7–7.7)
Platelets: 198 10*3/uL (ref 150–400)
RBC: 3.48 MIL/uL — ABNORMAL LOW (ref 4.22–5.81)
RDW: 12.6 % (ref 11.5–15.5)
RDW: 12.8 % (ref 11.5–15.5)
WBC: 13 10*3/uL — ABNORMAL HIGH (ref 4.0–10.5)

## 2012-07-15 LAB — POCT I-STAT 3, ART BLOOD GAS (G3+)
O2 Saturation: 96 %
pCO2 arterial: 29.3 mmHg — ABNORMAL LOW (ref 35.0–45.0)
pH, Arterial: 7.445 (ref 7.350–7.450)

## 2012-07-15 LAB — BASIC METABOLIC PANEL
Calcium: 9.3 mg/dL (ref 8.4–10.5)
Creatinine, Ser: 1.06 mg/dL (ref 0.50–1.35)
GFR calc Af Amer: 72 mL/min — ABNORMAL LOW (ref >90)
Sodium: 135 mEq/L (ref 135–145)

## 2012-07-15 LAB — COMPREHENSIVE METABOLIC PANEL
Albumin: 2.7 g/dL — ABNORMAL LOW (ref 3.5–5.2)
BUN: 16 mg/dL (ref 6–23)
Calcium: 8.3 mg/dL — ABNORMAL LOW (ref 8.4–10.5)
Creatinine, Ser: 1.04 mg/dL (ref 0.50–1.35)
GFR calc Af Amer: 73 mL/min — ABNORMAL LOW (ref >90)
Glucose, Bld: 144 mg/dL — ABNORMAL HIGH (ref 70–99)
Total Protein: 6 g/dL (ref 6.0–8.3)

## 2012-07-15 LAB — CK TOTAL AND CKMB (NOT AT ARMC)
CK, MB: 3.9 ng/mL (ref 0.3–4.0)
Relative Index: 1.5 (ref 0.0–2.5)

## 2012-07-15 LAB — URINALYSIS, ROUTINE W REFLEX MICROSCOPIC
Ketones, ur: NEGATIVE mg/dL
Nitrite: POSITIVE — AB
Protein, ur: NEGATIVE mg/dL
Urobilinogen, UA: 0.2 mg/dL (ref 0.0–1.0)

## 2012-07-15 LAB — TROPONIN I
Troponin I: <0.3 ng/mL (ref <0.30)
Troponin I: 1.22 ng/mL (ref <0.30)

## 2012-07-15 LAB — LACTIC ACID, PLASMA
Lactic Acid, Venous: 1.2 mmol/L (ref 0.5–2.2)
Lactic Acid, Venous: 2.1 mmol/L (ref 0.5–2.2)

## 2012-07-15 SURGERY — LEFT AND RIGHT HEART CATHETERIZATION WITH CORONARY ANGIOGRAM
Anesthesia: LOCAL

## 2012-07-15 MED ORDER — POTASSIUM CHLORIDE CRYS ER 20 MEQ PO TBCR
20.0000 meq | EXTENDED_RELEASE_TABLET | ORAL | Status: DC
  Administered 2012-07-17 – 2012-07-21 (×3): 20 meq via ORAL
  Filled 2012-07-15: qty 2
  Filled 2012-07-15 (×3): qty 1

## 2012-07-15 MED ORDER — NITROGLYCERIN 0.2 MG/ML ON CALL CATH LAB
INTRAVENOUS | Status: AC
  Filled 2012-07-15: qty 1

## 2012-07-15 MED ORDER — SODIUM CHLORIDE 0.9 % IV SOLN
INTRAVENOUS | Status: DC
  Administered 2012-07-15: 09:00:00 via INTRAVENOUS

## 2012-07-15 MED ORDER — ASPIRIN 81 MG PO CHEW
CHEWABLE_TABLET | ORAL | Status: AC
  Administered 2012-07-15: 324 mg via ORAL
  Filled 2012-07-15: qty 4

## 2012-07-15 MED ORDER — ALBUTEROL SULFATE (5 MG/ML) 0.5% IN NEBU
INHALATION_SOLUTION | RESPIRATORY_TRACT | Status: AC
  Administered 2012-07-15: 5 mg via RESPIRATORY_TRACT
  Filled 2012-07-15: qty 1

## 2012-07-15 MED ORDER — CLOPIDOGREL BISULFATE 75 MG PO TABS
ORAL_TABLET | ORAL | Status: AC
  Filled 2012-07-15: qty 2

## 2012-07-15 MED ORDER — IPRATROPIUM BROMIDE 0.02 % IN SOLN
0.5000 mg | Freq: Once | RESPIRATORY_TRACT | Status: AC
  Administered 2012-07-15: 0.5 mg via RESPIRATORY_TRACT

## 2012-07-15 MED ORDER — ALBUTEROL SULFATE (5 MG/ML) 0.5% IN NEBU
5.0000 mg | INHALATION_SOLUTION | Freq: Once | RESPIRATORY_TRACT | Status: AC
  Administered 2012-07-15: 5 mg via RESPIRATORY_TRACT

## 2012-07-15 MED ORDER — CIPROFLOXACIN IN D5W 400 MG/200ML IV SOLN
400.0000 mg | Freq: Two times a day (BID) | INTRAVENOUS | Status: DC
  Administered 2012-07-15 – 2012-07-17 (×4): 400 mg via INTRAVENOUS
  Filled 2012-07-15 (×4): qty 200

## 2012-07-15 MED ORDER — NITROGLYCERIN IN D5W 200-5 MCG/ML-% IV SOLN
2.0000 ug/min | Freq: Once | INTRAVENOUS | Status: AC
  Administered 2012-07-15: 10 ug/min via INTRAVENOUS

## 2012-07-15 MED ORDER — TEMAZEPAM 15 MG PO CAPS: 15.0000 mg | ORAL_CAPSULE | Freq: Every evening | ORAL | Status: DC | PRN

## 2012-07-15 MED ORDER — CARBAMAZEPINE ER 300 MG PO CP12: 300.0000 mg | ORAL_CAPSULE | Freq: Two times a day (BID) | ORAL | Status: DC

## 2012-07-15 MED ORDER — NITROGLYCERIN IN D5W 200-5 MCG/ML-% IV SOLN
INTRAVENOUS | Status: AC
  Administered 2012-07-15: 10 ug/min via INTRAVENOUS
  Filled 2012-07-15: qty 250

## 2012-07-15 MED ORDER — CLOPIDOGREL BISULFATE 75 MG PO TABS: 75.0000 mg | ORAL_TABLET | Freq: Every day | ORAL | Status: DC

## 2012-07-15 MED ORDER — NITROGLYCERIN 0.4 MG SL SUBL
SUBLINGUAL_TABLET | SUBLINGUAL | Status: AC
  Administered 2012-07-15: 0.4 mg via SUBLINGUAL
  Filled 2012-07-15: qty 25

## 2012-07-15 MED ORDER — AMIODARONE HCL IN DEXTROSE 360-4.14 MG/200ML-% IV SOLN
60.0000 mg/h | INTRAVENOUS | Status: AC
  Administered 2012-07-15 – 2012-07-16 (×3): 60 mg/h via INTRAVENOUS
  Filled 2012-07-15 (×4): qty 200

## 2012-07-15 MED ORDER — ONDANSETRON HCL 4 MG/2ML IJ SOLN: 4.0000 mg | Freq: Four times a day (QID) | INTRAMUSCULAR | Status: DC | PRN

## 2012-07-15 MED ORDER — FAMOTIDINE 20 MG PO TABS
20.0000 mg | ORAL_TABLET | Freq: Every day | ORAL | Status: DC
  Administered 2012-07-15 – 2012-07-22 (×8): 20 mg via ORAL
  Filled 2012-07-15 (×10): qty 1

## 2012-07-15 MED ORDER — DEXTROSE 5 % IV SOLN: 300.0000 mg | Freq: Once | INTRAVENOUS | Status: DC

## 2012-07-15 MED ORDER — TEMAZEPAM 15 MG PO CAPS
15.0000 mg | ORAL_CAPSULE | Freq: Every evening | ORAL | Status: DC | PRN
  Administered 2012-07-16 – 2012-07-21 (×7): 15 mg via ORAL
  Filled 2012-07-15 (×7): qty 1

## 2012-07-15 MED ORDER — ASPIRIN 81 MG PO CHEW
81.0000 mg | CHEWABLE_TABLET | Freq: Every day | ORAL | Status: DC
  Administered 2012-07-16 – 2012-07-22 (×7): 81 mg via ORAL
  Filled 2012-07-15 (×7): qty 1

## 2012-07-15 MED ORDER — LIDOCAINE HCL (PF) 1 % IJ SOLN
INTRAMUSCULAR | Status: AC
  Filled 2012-07-15: qty 30

## 2012-07-15 MED ORDER — NITROGLYCERIN 0.4 MG SL SUBL: 0.4000 mg | SUBLINGUAL_TABLET | SUBLINGUAL | Status: DC | PRN

## 2012-07-15 MED ORDER — ATROPINE SULFATE 1 MG/ML IJ SOLN
INTRAMUSCULAR | Status: AC
  Filled 2012-07-15: qty 1

## 2012-07-15 MED ORDER — SODIUM CHLORIDE 0.9 % IV BOLUS (SEPSIS)
500.0000 mL | Freq: Once | INTRAVENOUS | Status: AC
  Administered 2012-07-15: 500 mL via INTRAVENOUS

## 2012-07-15 MED ORDER — FINASTERIDE 5 MG PO TABS
5.0000 mg | ORAL_TABLET | Freq: Every day | ORAL | Status: DC
  Administered 2012-07-16 – 2012-07-22 (×7): 5 mg via ORAL
  Filled 2012-07-15 (×7): qty 1

## 2012-07-15 MED ORDER — IPRATROPIUM BROMIDE 0.02 % IN SOLN
RESPIRATORY_TRACT | Status: AC
  Administered 2012-07-15: 0.5 mg via RESPIRATORY_TRACT
  Filled 2012-07-15: qty 2.5

## 2012-07-15 MED ORDER — ALBUTEROL SULFATE HFA 108 (90 BASE) MCG/ACT IN AERS: 2.0000 | INHALATION_SPRAY | Freq: Four times a day (QID) | RESPIRATORY_TRACT | Status: DC | PRN

## 2012-07-15 MED ORDER — SODIUM CHLORIDE 0.9 % IV SOLN
INTRAVENOUS | Status: AC
  Administered 2012-07-15: 16:00:00 via INTRAVENOUS

## 2012-07-15 MED ORDER — ACETAMINOPHEN 325 MG PO TABS
650.0000 mg | ORAL_TABLET | ORAL | Status: DC | PRN
  Administered 2012-07-17: 650 mg via ORAL
  Filled 2012-07-15: qty 2

## 2012-07-15 MED ORDER — NITROGLYCERIN 0.4 MG SL SUBL
0.4000 mg | SUBLINGUAL_TABLET | SUBLINGUAL | Status: DC | PRN
  Administered 2012-07-20 (×3): 0.4 mg via SUBLINGUAL
  Filled 2012-07-15 (×2): qty 25

## 2012-07-15 MED ORDER — HEPARIN (PORCINE) IN NACL 2-0.9 UNIT/ML-% IJ SOLN
INTRAMUSCULAR | Status: AC
  Filled 2012-07-15: qty 1000

## 2012-07-15 MED ORDER — METOPROLOL SUCCINATE ER 50 MG PO TB24
50.0000 mg | ORAL_TABLET | Freq: Every day | ORAL | Status: DC
  Administered 2012-07-16 – 2012-07-20 (×5): 50 mg via ORAL
  Filled 2012-07-15 (×6): qty 1

## 2012-07-15 MED ORDER — CALCIUM CARBONATE-VITAMIN D 500-200 MG-UNIT PO TABS
1.0000 | ORAL_TABLET | Freq: Two times a day (BID) | ORAL | Status: DC
  Administered 2012-07-15 – 2012-07-22 (×14): 1 via ORAL
  Filled 2012-07-15 (×15): qty 1

## 2012-07-15 MED ORDER — SERTRALINE HCL 50 MG PO TABS
150.0000 mg | ORAL_TABLET | Freq: Every day | ORAL | Status: DC
  Administered 2012-07-16 – 2012-07-22 (×7): 150 mg via ORAL
  Filled 2012-07-15 (×7): qty 1

## 2012-07-15 MED ORDER — ASPIRIN 81 MG PO CHEW
324.0000 mg | CHEWABLE_TABLET | Freq: Once | ORAL | Status: AC
  Administered 2012-07-15: 324 mg via ORAL

## 2012-07-15 MED ORDER — BIVALIRUDIN 250 MG IV SOLR
INTRAVENOUS | Status: AC
  Filled 2012-07-15: qty 250

## 2012-07-15 MED ORDER — ISOSORBIDE MONONITRATE ER 60 MG PO TB24
60.0000 mg | ORAL_TABLET | Freq: Every day | ORAL | Status: DC
  Administered 2012-07-16 – 2012-07-20 (×5): 60 mg via ORAL
  Filled 2012-07-15 (×5): qty 1

## 2012-07-15 MED ORDER — CLOPIDOGREL BISULFATE 75 MG PO TABS
75.0000 mg | ORAL_TABLET | Freq: Every day | ORAL | Status: DC
  Administered 2012-07-16 – 2012-07-22 (×7): 75 mg via ORAL
  Filled 2012-07-15 (×7): qty 1

## 2012-07-15 MED ORDER — CIPROFLOXACIN HCL 500 MG PO TABS
500.0000 mg | ORAL_TABLET | Freq: Two times a day (BID) | ORAL | Status: DC
  Filled 2012-07-15 (×2): qty 1

## 2012-07-15 MED ORDER — CARBAMAZEPINE ER 100 MG PO TB12
300.0000 mg | ORAL_TABLET | Freq: Two times a day (BID) | ORAL | Status: DC
Start: 2012-07-15 — End: 2012-07-22
  Administered 2012-07-15 – 2012-07-22 (×14): 300 mg via ORAL
  Filled 2012-07-15 (×15): qty 1

## 2012-07-15 MED ORDER — ATORVASTATIN CALCIUM 80 MG PO TABS
80.0000 mg | ORAL_TABLET | Freq: Every day | ORAL | Status: DC
  Administered 2012-07-15 – 2012-07-21 (×6): 80 mg via ORAL
  Filled 2012-07-15 (×8): qty 1

## 2012-07-15 NOTE — CV Procedure (Signed)
PROCEDURE:  Left heart catheterization with selective coronary angiography, left ventriculogram. Arterial conduit angiography, vein graft angiography  INDICATIONS:  Cardiac arrest; NSTEMI  The risks, benefits, and details of the procedure were explained to the patient.  The patient verbalized understanding and wanted to proceed.  Informed written consent was obtained.  PROCEDURE TECHNIQUE:  After Xylocaine anesthesia a 58F sheath was placed in the right femoral artery with a single anterior needle wall stick.   Left coronary angiography was done using a Judkins L4 guide catheter.  Right coronary angiography was done using a Judkins R4 guide catheter.  Left ventriculography was done using a pigtail catheter.    CONTRAST:  Total of 160 cc.  COMPLICATIONS:  None.    HEMODYNAMICS:  Aortic pressure was 94/42; LV pressure was 102/3; LVEDP 11.  There was no gradient between the left ventricle and aorta.    ANGIOGRAPHIC DATA:   The left main coronary artery is diffusely diseased, including an ostial 50% lesion.  The left anterior descending artery is occluded proximally.  The distal vessel fills from the LIMA to LAD.  There is an 90% stenosis in the mid LAD past the insertion of the LIMA to the LAD.  The left circumflex artery has a hazy, proximal 50-60% stenosis.  There is a large OM 2 that fills by left to left collaterals, retrograde.  The right coronary artery is occluded in the proximal portion.  There are bridging collaterals filling several RV marginal branches.  The distal RCA territory fills by a free RIMA graft.  The SVG to OM is occluded proximally.  The previously placed stents are occluded.    The SVG to diagonal is occluded proximally.  Of note, this was the vessel that was revascularized in 2010.  The RIMA to RCA appears widely patent.  The posterior descending artery has mild disease.  The posterior lateral artery has moderate stenosis proximally.  LEFT VENTRICULOGRAM:  Left  ventricular angiogram was done in the 30 RAO projection and revealed mid anterior wall hypokinesis with overall normal systolic function with an estimated ejection fraction of 50%.  LVEDP was 11 mmHg.  PCI NARRATIVE: Angiomax was used for anticoagulation.  An ACT was used to ensure therapeutic result.  An ALT guide was used to engage the SVG to diagonal.  It was thought that this was the culprit initially.  We tried a Designer, industrial/product and a Nature conservation officer.  Both wires were unable to cross the occlusion, as it appeared chronically occluded.  An IMA guide was used to engage the LIMA.  A pro-water wire was placed through the LIMA and across the stenosis in the mid LAD.  A 2.0 x 12 balloon was used to predilate the lesion.  This is inflated 12 atmospheres.  A 2.5 x 15 vision bare metal stent was deployed at 10 atmospheres.  The midportion of the stent was postdilated with a 2.5 x 8 noncompliant balloon, inflated to 14 atmospheres.  There is no residual stenosis.  Several doses of intracoronary nitroglycerin were used to treat vasospasm in the distal LAD.  TIMI 3 flow was maintained throughout.  A bare metal stent was used to prevent the need for prolonged dual antiplatelet therapy, given his age and comorbidities.    IMPRESSIONS:  1. Moderate disease in the left main coronary artery. 2. Occluded proximal left anterior descending artery.  LIMA to LAD is widely patent.  90% mid LAD lesion was successfully stented with a 2.5 x 15 vision bare metal  stent. 3. Moderate disease in the proximal left circumflex.  Large OM 2 is occluded and fills retrograde, by left to left collaterals. 4. Occluded native proximal right coronary artery.  Patent free RIMA to distal RCA.  Moderate disease in the posterior lateral artery. 5. Overall normal left ventricular systolic function.  Mild, mid anterior hypokinesis.  LVEDP 11 mmHg.  Ejection fraction 50 %.  RECOMMENDATION:  He'll be watched in the ICU.  Continue IV amiodarone  until tomorrow morning.  He requested a Foley catheter so this was placed.  He believes he has prostatitis.  Once the urinalysis is back, an antibiotic may need to be started.  Continue beta blocker and statin.  Post cath hydration.

## 2012-07-15 NOTE — Progress Notes (Signed)
Chaplain checked with cath lab and learned procedure had just been completed. Pt was smiling and talking with medical staff. Doctor said he would come soon to consult with family. I informed family of this and stayed with them while doctor gave his report. They were very pleased pt had tolerated the procedure so well. Pt's pastor was with family and led prayer of thanks. I escorted family to 2900 waiting area and coordinated with 2900 secretary to let family know once pt is situated in his room. I informed pt's wife that pt will be in 2908. Pt's family thanked chaplain for his assistance.

## 2012-07-15 NOTE — Progress Notes (Signed)
Chaplain responded to Code STEMI page for pt in room 4 in ED. Chaplain provided emotional support for pt's wife, son, and grandson. Pt had fallen at home this morning and cut his head. Head wound was stitched up here. Pt has history of heart issues. His heart stopped here in ED today. [facility] staff shocked his heart and restarted it. Plans are to send pt to cath lab. Chaplain learned family had some reservations about sending pt to cath lab and facilitated discussion with Dr. Daleen Squibb. After this consultation, family was on board with cath lab. Pt was taken to cath lab and chaplain escorted family to consult room 4 to await cath lab results. Chaplain informed cath lab of family's location.

## 2012-07-15 NOTE — ED Notes (Signed)
Pt tried but was unable to void.

## 2012-07-15 NOTE — Progress Notes (Signed)
Pt c/o chest pressure.Pt noted to be chattering teeth and c/o suddenly  feeling very cold.Skin color w/grayish hue.Hr dropped to 58 ( from 80's).B/P = 94/43 (sudden drop in sb/p from 120's) . NTG not given SL due to b/p .E-link Dr called and primary Dr paged.12-lead EKG done. Then , @ 17:59,HR increased to 90's w/PVC couplets ,sbp increased to > 100 .Amiodarone drip begun at 60mg /hr. Rt groin sheath still in place .

## 2012-07-15 NOTE — ED Notes (Signed)
Received pt from home with c/o family was going to bring pt to Dr for fever, urinary symptoms and intermittent left sided chest pain. Pt was on toilet and fell forward. Pt denies +LOC. Per EMS blood noted on tub. Pt has laceration to top of head and avulsion to scalp area. Pt took 1 Nitro at home and was given 324 mg of ASA by EMS.

## 2012-07-15 NOTE — Progress Notes (Signed)
Patient ID: [name], male   DOB: [birth year], 78 y.o.   MRN: [id number]   Patient ID: [name] MRN: [id number], DOB/AGE: [birth year]   Admit date: 07/15/2012   Primary Physician: Evette Georges, MD Primary Cardiologist: Cassell Clement MD  Pt. Profile:  78 year old married white male presents with a fall, subsequent chest pain, and sustained V. tach requiring DC cardioversion. EKG is worrisome as is his story for an acute MI. He is baseline interventricular conduction delay but ST segments are now elevated anterio- laterally.  Problem List  Past Medical History  Diagnosis Date  . Ischemic heart disease   . Myocardial infarction   . CHF (congestive heart failure)   . Hypoxia     chronic  . Angina     when walking  . Dizziness     with low blood pressure  . Dyslipidemia (high LDL; low HDL)   . Hypertension   . Coronary artery disease   . Heart murmur   . Peripheral vascular disease     thrombus- in leg- many yrs. ago- ?R- coumadin x1 mth  . Pneumonia     hosp. long time ago   . Shortness of breath   . COPD (chronic obstructive pulmonary disease)     Dec. 2010- d/c O2(had been using for 2 mths)  . Blood transfusion     post CABG  . Hepatitis     hepatitis- long time ago, occupational contamination   . GERD (gastroesophageal reflux disease)   . Cancer     prostate - Ca- radiation therapy- 2002  . Peripheral neuropathy   . Hiatal hernia   . Arthritis     hnp, lumbar, ankle (foot drop-R), wears a splint, arthritis - "all over", low bone density   . Depression     uses Zoloft     Past Surgical History  Procedure Date  . Coronary artery bypass graft 1986 and 1993  . Ptca 2008  . Cardiac catheterization     2010- last cath, two stents at that time  . Eye surgery     cataracts - bilateral , IOL  . Hernia repair     bilateral inguiinal repair   . Tonsillectomy   . Fracture surgery     heel- crushed -2002,  (hardware)   . Joint replacement     R  knee- arthroscopic, x2, R great toe- 2 surgeries- /w hardware & thenlater fused by Dr. Lajoyce Corners    . Appendectomy   . Rhinoplasty     as a teen, followed by same procedure at 1970's   . Lumbar laminectomy/decompression microdiscectomy 08/10/2011    Procedure: LUMBAR LAMINECTOMY/DECOMPRESSION MICRODISCECTOMY;  Surgeon: Kathaleen Maser Pool;  Location: [facility];  Service: Neurosurgery;  Laterality: Right;  RIGHT Lumbar five-sacral one LAMINECTOMY, MICRODISCECTOMY  . Lumbar wound debridement 08/22/2011    Procedure: LUMBAR WOUND DEBRIDEMENT;  Surgeon: Tia Alert;  Location: [facility];  Service: Neurosurgery;  Laterality: N/A;  Repair of CSF Leak requiring laminectomy     Allergies  No Known Allergies  HPI  This morning, while bathing, he fell backwards out of the tub. He struck his head with subsequent laceration. His wife says that they were going to call 911 E. before he fell because of a temperature as high as 103 this week. He apparently was evaluated by urology this week and told he had prostatitis. However he is not on any antibiotics.   In  the emergency room, he developed chest pain.  EKG was repeated and though he has baseline interventricular conduction delay he had ST elevation increased in 1 aVL and V  2 and 3. Code STEMI was called and I responded. Shortly after I arrived, he went into sustained V. tach and lost his pulse. He was cardioverted with 120 J by transcutaneous pads. He was loaded with amiodarone 300mg  stabilized. He is now A. fib. Taken directly to the catheterization lab thereafter for emergent catheterization and PCI.  Chest x-ray shows no acute cardiopulmonary disease. His white blood cell count is not elevated. His creatinine is relatively normal. He is not acidotic.  His CT showed no acute pathology or bleed  Home Medications  Prior to Admission medications   Medication Sig Start Date End Date Taking? Authorizing Provider  albuterol (PROVENTIL HFA;VENTOLIN HFA) 108  (90 BASE) MCG/ACT inhaler Inhale 2 puffs into the lungs every 6 (six) hours as needed. For shortness of breath.    Yes Historical Provider, MD  calcium-vitamin D (OSCAL WITH D 500-200) 500-200 MG-UNIT per tablet Take 1 tablet by mouth 2 (two) times daily.     Yes Historical Provider, MD  carbamazepine (CARBATROL) 300 MG 12 hr capsule Take 300 mg by mouth 2 (two) times daily.     Yes Historical Provider, MD  clopidogrel (PLAVIX) 75 MG tablet Take 75 mg by mouth daily.     Yes Historical Provider, MD  finasteride (PROSCAR) 5 MG tablet Take 5 mg by mouth daily.     Yes Historical Provider, MD  furosemide (LASIX) 20 MG tablet Take 20 mg by mouth every other day.     Yes Historical Provider, MD  glucosamine-chondroitin 500-400 MG tablet Take 1 tablet by mouth 2 (two) times daily.   Yes Historical Provider, MD  isosorbide mononitrate (IMDUR) 60 MG 24 hr tablet Take 60 mg by mouth daily.   Yes Historical Provider, MD  metoprolol (TOPROL-XL) 50 MG 24 hr tablet Take 50 mg by mouth daily.    Yes Historical Provider, MD  nitroGLYCERIN (NITROSTAT) 0.4 MG SL tablet Place 0.4 mg under the tongue every 5 (five) minutes as needed. For chest pain   Yes Historical Provider, MD  potassium chloride SA (K-DUR,KLOR-CON) 20 MEQ tablet Take 20 mEq by mouth every Monday, Wednesday, and Friday.    Yes Historical Provider, MD  ranitidine (ZANTAC) 150 MG capsule Take 150 mg by mouth daily.   Yes Historical Provider, MD  rosuvastatin (CRESTOR) 40 MG tablet Take 40 mg by mouth daily.   Yes Historical Provider, MD  sertraline (ZOLOFT) 100 MG tablet Take 150 mg by mouth daily.    Yes Historical Provider, MD  temazepam (RESTORIL) 15 MG capsule Take 15 mg by mouth at bedtime as needed. For sleep 06/22/12  Yes Cassell Clement, MD    Family History  Family History  Problem Relation Age of Onset  . Heart attack Mother   . Addison's disease Father   . Heart attack Sister   . Anesthesia problems Neg Hx   . Hypotension Neg Hx     . Malignant hyperthermia Neg Hx   . Pseudochol deficiency Neg Hx     Social History  History   Social History  . Marital Status: Married    Spouse Name: N/A    Number of Children: N/A  . Years of Education: N/A   Occupational History  . Not on file.   Social History Main Topics  . Smoking status: Never Smoker   . Smokeless tobacco: Not on file  .  Alcohol Use: No  . Drug Use: No  . Sexually Active:    Other Topics Concern  . Not on file   Social History Narrative  . No narrative on file     Review of Systems General:  No chills,  night sweats or weight changes.  Cardiovascular: No dyspnea on exertion, edema, orthopnea, palpitations, paroxysmal nocturnal dyspnea. Dermatological: No rash, lesions/masses Respiratory: No cough, dyspnea Urologic: No hematuria, dysuria Abdominal:   No nausea, vomiting, diarrhea, bright red blood per rectum, melena, or hematemesis Neurologic:  No visual changes, wkns, changes in mental status. All other systems reviewed and are otherwise negative except as noted above.  Physical Exam  Blood pressure 92/52, pulse 99, temperature 100.3 F (37.9 C), temperature source Rectal, resp. rate 32, SpO2 99.00%.  General: Pleasant, NAD, frail Psych: Normal affect. Neuro: Alert and oriented X 3. Moves all extremities spontaneously. HEENT: Normal, teeth are all intact  Neck: Supple without bruits or JVD. Lungs:  Resp regular and unlabored, CTA. Heart: RRR no s3, s4, or murmurs. Abdomen: Soft, non-tender, non-distended, BS + x 4.  Extremities: No clubbing, cyanosis or edema. DP/PT/Radials 2+ except 1+ dorsalis pedis the right lower extremity.  Labs   Basename 07/15/12 0805  CKTOTAL --  CKMB --  TROPONINI <0.30   Lab Results  Component Value Date   WBC 9.6 07/15/2012   HGB 12.7* 07/15/2012   HCT 36.8* 07/15/2012   MCV 97.4 07/15/2012   PLT 198 07/15/2012    Lab 07/15/12 0804  NA 135  K 4.0  CL 100  CO2 26  BUN 14  CREATININE  1.06  CALCIUM 9.3  PROT --  BILITOT --  ALKPHOS --  ALT --  AST --  GLUCOSE 131*   Lab Results  Component Value Date   CHOL 181 07/04/2012   HDL 57.20 07/04/2012   LDLCALC 106* 07/04/2012   TRIG 89.0 07/04/2012   Lab Results  Component Value Date   DDIMER  Value: 1.56        AT THE INHOUSE ESTABLISHED CUTOFF VALUE OF 0.48 ug/mL FEU, THIS ASSAY HAS BEEN DOCUMENTED IN THE LITERATURE TO HAVE A SENSITIVITY AND NEGATIVE PREDICTIVE VALUE OF AT LEAST 98 TO 99%.  THE TEST RESULT SHOULD BE CORRELATED WITH AN ASSESSMENT OF THE CLINICAL PROBABILITY OF DVT / VTE.* 04/14/2009     Radiology/Studies  Dg Chest 2 View  07/15/2012  *RADIOLOGY REPORT*  Clinical Data: Weakness.  Fall.  Head laceration.  Chest pain and shortness of breath.  CHEST - 2 VIEW  Comparison: 08/09/2011.  Findings: Chronic bronchitic changes are present at the lung bases. CABG/median sternotomy.  Cardiopericardial silhouette is upper limits of normal for projection and appears similar to the prior exam. Monitoring leads are projected over the chest.  There is no airspace disease.  No pleural effusion.  Aortic atherosclerosis. Small high area of pulmonary parenchymal scarring lateral to the left hilum appears similar to the prior exams.  No displaced rib fractures. No pneumothorax.  IMPRESSION: Chronic changes of the chest without acute cardiopulmonary disease.   Original Report Authenticated By: Andreas Newport, M.D.    Ct Head Wo Contrast  07/15/2012  *RADIOLOGY REPORT*  Clinical Data: The patient fell hitting head.  Positive loss of consciousness with a frontal scalp laceration.  CT HEAD WITHOUT CONTRAST,CT CERVICAL SPINE WITHOUT CONTRAST  Technique:  Contiguous axial images were obtained from the base of the skull through the vertex without contrast.,Technique: Multidetector CT imaging of the cervical spine was performed.  Multiplanar CT image reconstructions were also generated.  Comparison: None.  Findings: Head CT:  The ventricles  are normal in configuration. There is ventricular and sulcal enlargement reflecting moderate age related atrophy.  No parenchymal masses or mass effect.  Mild patchy white matter hypoattenuation is noted most consistent with chronic microvascular ischemic change.  There is no evidence of a recent infarct.  No intracranial hemorrhage.  No extra-axial masses or abnormal fluid collections.  No skull fracture.  The visualized sinuses and mastoid air cells are clear.  There is a fracture of the base of the left nasal bone.  This could be chronic or acute.  Cervical spine CT:  There is no fracture or spondylolisthesis. There is some resorption at the tip of the odontoid process that appears chronic.  There is loss of disc height with endplate osteophytes throughout most of the cervical spine with endplate spurring and facet spurring leading to varying degrees of neural foraminal narrowing.  Neural foraminal narrowing is greatest bilaterally at C5-C6, moderate to severe bilaterally.  The bones are demineralized.  The surrounding soft tissues are unremarkable.  The lung apices are clear.  IMPRESSION: Head CT:  No acute intracranial abnormality.  Fracture at the base of the left nasal bone may be acute or chronic.  Cervical spine CT:  No fracture or acute finding   Original Report Authenticated By: Amie Portland, M.D.    Ct Cervical Spine Wo Contrast  07/15/2012  *RADIOLOGY REPORT*  Clinical Data: The patient fell hitting head.  Positive loss of consciousness with a frontal scalp laceration.  CT HEAD WITHOUT CONTRAST,CT CERVICAL SPINE WITHOUT CONTRAST  Technique:  Contiguous axial images were obtained from the base of the skull through the vertex without contrast.,Technique: Multidetector CT imaging of the cervical spine was performed. Multiplanar CT image reconstructions were also generated.  Comparison: None.  Findings: Head CT:  The ventricles are normal in configuration. There is ventricular and sulcal enlargement  reflecting moderate age related atrophy.  No parenchymal masses or mass effect.  Mild patchy white matter hypoattenuation is noted most consistent with chronic microvascular ischemic change.  There is no evidence of a recent infarct.  No intracranial hemorrhage.  No extra-axial masses or abnormal fluid collections.  No skull fracture.  The visualized sinuses and mastoid air cells are clear.  There is a fracture of the base of the left nasal bone.  This could be chronic or acute.  Cervical spine CT:  There is no fracture or spondylolisthesis. There is some resorption at the tip of the odontoid process that appears chronic.  There is loss of disc height with endplate osteophytes throughout most of the cervical spine with endplate spurring and facet spurring leading to varying degrees of neural foraminal narrowing.  Neural foraminal narrowing is greatest bilaterally at C5-C6, moderate to severe bilaterally.  The bones are demineralized.  The surrounding soft tissues are unremarkable.  The lung apices are clear.  IMPRESSION: Head CT:  No acute intracranial abnormality.  Fracture at the base of the left nasal bone may be acute or chronic.  Cervical spine CT:  No fracture or acute finding   Original Report Authenticated By: Amie Portland, M.D.     ECG  Sinus rhythm with ST segment elevation in 1 aVL V2 through V3 with baseline interventricular conduction delay  ASSESSMENT AND PLAN an [name] is an 78 year old very fragile white male who presents with a weeklong of fever of unknown etiology, subsequent fall this morning with scalp laceration,  now with chest pain, sustained V. tach requiring cardioversion in the emergency department with ST segment elevation anterolaterally. His complex three-vessel disease and has had previous bypass and stents as late as 2010. At long discussion with the patient as well as his wife and son. They agree with emergent catheterization with Dr Eldridge Dace for possible culprit PCI.  They  understand increased risk secondary to his clinical instability as well as his other multiple comorbidities.    Signed, Valera Castle, MD 07/15/2012, @[facility]

## 2012-07-15 NOTE — ED Notes (Signed)
Dr. Daleen Squibb and Dr. Effie Shy at bedside, pt in V-tach. Pt shocked with sync at 150J. Pt given amiodorone 300mg  at 1335. Nitro started at 27mcg/min at 1345.

## 2012-07-15 NOTE — ED Provider Notes (Signed)
History     CSN: [id number]  Arrival date & time 07/15/12  6578   First MD Initiated Contact with Patient 07/15/12 0805      Chief Complaint  Patient presents with  . Fall  . Head Injury    (Consider location/radiation/quality/duration/timing/severity/associated sxs/prior treatment) HPI Comments: [name] is a 78 y.o. Male who was sitting on a commode, and fell forward. He has been weak for several days due to to what he suspects is a prostate infection. He saw his PCP, had a urine sent, and it did not show infection. He was treated with urinary analgesic. He had a fever. During the night of 102. He took Tylenol for the fever. The fever got better. He denies nausea, vomiting, chest pain, shortness of breath. There has been no seizure or suspected syncope. There are no other modifying factors.  Patient is a 78 y.o. male presenting with fall and head injury. The history is provided by the patient.  Fall  Head Injury     Past Medical History  Diagnosis Date  . Ischemic heart disease   . Myocardial infarction   . CHF (congestive heart failure)   . Hypoxia     chronic  . Angina     when walking  . Dizziness     with low blood pressure  . Dyslipidemia (high LDL; low HDL)   . Hypertension   . Coronary artery disease   . Heart murmur   . Peripheral vascular disease     thrombus- in leg- many yrs. ago- ?R- coumadin x1 mth  . Pneumonia     hosp. long time ago   . Shortness of breath   . COPD (chronic obstructive pulmonary disease)     Dec. 2010- d/c O2(had been using for 2 mths)  . Blood transfusion     post CABG  . Hepatitis     hepatitis- long time ago, occupational contamination   . GERD (gastroesophageal reflux disease)   . Cancer     prostate - Ca- radiation therapy- 2002  . Peripheral neuropathy   . Hiatal hernia   . Arthritis     hnp, lumbar, ankle (foot drop-R), wears a splint, arthritis - "all over", low bone density   . Depression     uses Zoloft      Past Surgical History  Procedure Date  . Coronary artery bypass graft 1986 and 1993  . Ptca 2008  . Cardiac catheterization     2010- last cath, two stents at that time  . Eye surgery     cataracts - bilateral , IOL  . Hernia repair     bilateral inguiinal repair   . Tonsillectomy   . Fracture surgery     heel- crushed -2002,  (hardware)   . Joint replacement     R knee- arthroscopic, x2, R great toe- 2 surgeries- /w hardware & thenlater fused by Dr. Lajoyce Corners    . Appendectomy   . Rhinoplasty     as a teen, followed by same procedure at 1970's   . Lumbar laminectomy/decompression microdiscectomy 08/10/2011    Procedure: LUMBAR LAMINECTOMY/DECOMPRESSION MICRODISCECTOMY;  Surgeon: Kathaleen Maser Pool;  Location: [facility];  Service: Neurosurgery;  Laterality: Right;  RIGHT Lumbar five-sacral one LAMINECTOMY, MICRODISCECTOMY  . Lumbar wound debridement 08/22/2011    Procedure: LUMBAR WOUND DEBRIDEMENT;  Surgeon: Tia Alert;  Location: [facility];  Service: Neurosurgery;  Laterality: N/A;  Repair of CSF Leak requiring laminectomy  Family History  Problem Relation Age of Onset  . Heart attack Mother   . Addison's disease Father   . Heart attack Sister   . Anesthesia problems Neg Hx   . Hypotension Neg Hx   . Malignant hyperthermia Neg Hx   . Pseudochol deficiency Neg Hx     History  Substance Use Topics  . Smoking status: Never Smoker   . Smokeless tobacco: Not on file  . Alcohol Use: No      Review of Systems  All other systems reviewed and are negative.    Allergies  Review of patient's allergies indicates no known allergies.  Home Medications   No current outpatient prescriptions on file.  BP 104/44  Pulse 84  Temp 99.7 F (37.6 C) (Oral)  Resp 21  SpO2 95%  Physical Exam  Nursing note and vitals reviewed. Constitutional: He is oriented to person, place, and time. He appears well-developed and well-nourished.       Frail and elderly  HENT:  Head:  Normocephalic and atraumatic.  Right Ear: External ear normal.  Left Ear: External ear normal.       Gaping laceration, left parietal, with palpable subcutaneous hematoma. Midline laceration, midfrontal area.   Eyes: Conjunctivae normal and EOM are normal. Pupils are equal, round, and reactive to light.  Neck: Normal range of motion and phonation normal. Neck supple.  Cardiovascular: Normal rate, regular rhythm, normal heart sounds and intact distal pulses.   Pulmonary/Chest: Effort normal and breath sounds normal. He exhibits no bony tenderness.  Abdominal: Soft. Normal appearance. There is no tenderness.  Genitourinary:       Normal anus. Prostate without swelling, nodularity or tenderness  Musculoskeletal: Normal range of motion.  Neurological: He is alert and oriented to person, place, and time. He has normal strength. No cranial nerve deficit or sensory deficit. He exhibits normal muscle tone. Coordination normal.  Skin: Skin is warm, dry and intact.  Psychiatric: He has a normal mood and affect. His behavior is normal. Judgment and thought content normal.    ED Course  Procedures (including critical care time)  Patient was removed from cervical immobilization on a backboard by myself with assistance of nursing staff.  LACERATION REPAIR Performed by: Flint Melter Consent: Verbal consent obtained. Risks and benefits: risks, benefits and alternatives were discussed Patient identity confirmed: provided demographic data Time out performed prior to procedure Prepped and Draped in normal sterile fashion Wound explored Laceration Location: left parietal scalp Laceration Length: 8.5cm No Foreign Bodies seen or palpated Anesthesia: local infiltration Local anesthetic: lidocaine 2% with epinephrine Anesthetic total: 8 ml Irrigation method: syringe Amount of cleaning: standard Skin closure: regular staples Number of sutures or staples: 13 Technique: staple Patient tolerance:  Patient tolerated the procedure well with no immediate complications.  LACERATION REPAIR Performed by: Flint Melter Consent: Verbal consent obtained. Risks and benefits: risks, benefits and alternatives were discussed Patient identity confirmed: provided demographic data Time out performed prior to procedure Prepped and Draped in normal sterile fashion Wound explored Laceration Location: mid-forehead/scalp Laceration Length: 2.5cm No Foreign Bodies seen or palpated Anesthesia: local infiltration Local anesthetic: lidocaine 2% with epinephrine Anesthetic total: 2 ml Irrigation method: syringe Amount of cleaning: standard Skin closure: suture Number of sutures or staples: 2 Technique: simple Patient tolerance: Patient tolerated the procedure well with no immediate complications.    Date: 06/16/2012- 08:03  Rate: 86  Rhythm: normal sinus rhythm  QRS Axis: normal  PR and QT Intervals: normal  ST/T Wave  abnormalities: nonspecific ST changes  PR and QRS Conduction Disutrbances:left bundle branch block  Narrative Interpretation:   Old EKG Reviewed: unchanged    Date: 06/16/2012- # 2- 13:12  Rate: 102  Rhythm: sinus tachy  QRS Axis: normal  PR and QT Intervals: normal  ST/T Wave abnormalities: Anterior ST elevation and inferior ST depression  PR and QRS Conduction Disutrbances: LBBB  Narrative Interpretation:   Old EKG Reviewed: compared to EKG at 0803, new ST elevation anteriorly and inferior reciprocal ST depression.  13:20- patient has been wheezing for about 30 minutes. He is having gradually worsening, shortness of breath. His chest feels tight. On anterior lung auscultation his air movement is diminished left greater than right, and he has end-expiratory wheezes. He, states that he has 8/10, chest pain, now. Repeat 12-lead EKG shows ST elevation in V2 and 3 greater than earlier. This would be consistent with STEMI. Initial treatment, aspirin, and nitroglycerin,  sublingual. Constantly, paged.  Additional interventions, check ABG, chest x-ray. Give albuterol nebulizer.   Cardiopulmonary Resuscitation (CPR) Procedure Note Directed/Performed by: Flint Melter I personally directed ancillary staff and/or performed CPR in an effort to regain return of spontaneous circulation and to maintain cardiac, neuro and systemic perfusion.    Patient developed cardiac 160 beats per minute (Ventricular Tachycardia). He he continued to have a pulse, but it became thready. He was cardioverted successfully, to junctional bradycardia. He was given amiodarone 300 mg IVP.  ABG shows mild decreased CO2, and normal pO2.  CRITICAL CARE Performed by: Flint Melter   Total critical care time: 75 minutes  Critical care time was exclusive of separately billable procedures and treating other patients.  Critical care was necessary to treat or prevent imminent or life-threatening deterioration.  Critical care was time spent personally by me on the following activities: development of treatment plan with patient and/or surrogate as well as nursing, discussions with consultants, evaluation of patient's response to treatment, examination of patient, obtaining history from patient or surrogate, ordering and performing treatments and interventions, ordering and review of laboratory studies, ordering and review of radiographic studies, pulse oximetry and re-evaluation of patient's condition.   Labs Reviewed  CBC WITH DIFFERENTIAL - Abnormal; Notable for the following:    RBC 3.78 (*)     Hemoglobin 12.7 (*)     HCT 36.8 (*)     Neutrophils Relative 85 (*)     Neutro Abs 8.1 (*)     Lymphocytes Relative 5 (*)     Lymphs Abs 0.4 (*)     All other components within normal limits  BASIC METABOLIC PANEL - Abnormal; Notable for the following:    Glucose, Bld 131 (*)     GFR calc non Af Amer 62 (*)     GFR calc Af Amer 72 (*)     All other components within normal limits    URINALYSIS, ROUTINE W REFLEX MICROSCOPIC - Abnormal; Notable for the following:    Color, Urine GREEN (*)  BIOCHEMICALS MAY BE AFFECTED BY COLOR   APPearance CLOUDY (*)     Hgb urine dipstick LARGE (*)     Nitrite POSITIVE (*)     Leukocytes, UA MODERATE (*)     All other components within normal limits  POCT I-STAT 3, BLOOD GAS (G3+) - Abnormal; Notable for the following:    pCO2 arterial 29.3 (*)     pO2, Arterial 79.0 (*)     Acid-base deficit 3.0 (*)     All other components  within normal limits  CK TOTAL AND CKMB - Abnormal; Notable for the following:    Total CK 259 (*)     All other components within normal limits  TROPONIN I - Abnormal; Notable for the following:    Troponin I 1.22 (*)     All other components within normal limits  URINE MICROSCOPIC-ADD ON - Abnormal; Notable for the following:    Bacteria, UA MANY (*)     All other components within normal limits  COMPREHENSIVE METABOLIC PANEL - Abnormal; Notable for the following:    Glucose, Bld 144 (*)     Calcium 8.3 (*)     Albumin 2.7 (*)     AST 49 (*)     GFR calc non Af Amer 63 (*)     GFR calc Af Amer 73 (*)     All other components within normal limits  CBC WITH DIFFERENTIAL - Abnormal; Notable for the following:    WBC 13.0 (*)     RBC 3.48 (*)     Hemoglobin 11.5 (*)     HCT 33.9 (*)     Neutrophils Relative 96 (*)     Neutro Abs 12.5 (*)     Lymphocytes Relative 2 (*)     Lymphs Abs 0.3 (*)     Monocytes Relative 2 (*)     All other components within normal limits  LACTIC ACID, PLASMA  TROPONIN I  LACTIC ACID, PLASMA  URINE CULTURE  CK TOTAL AND CKMB  CK TOTAL AND CKMB  CBC  BASIC METABOLIC PANEL  CULTURE, BLOOD (ROUTINE X 2)  CULTURE, BLOOD (ROUTINE X 2)  URINE CULTURE  TROPONIN I  TROPONIN I   Dg Chest 2 View  07/15/2012  *RADIOLOGY REPORT*  Clinical Data: Weakness.  Fall.  Head laceration.  Chest pain and shortness of breath.  CHEST - 2 VIEW  Comparison: 08/09/2011.  Findings:  Chronic bronchitic changes are present at the lung bases. CABG/median sternotomy.  Cardiopericardial silhouette is upper limits of normal for projection and appears similar to the prior exam. Monitoring leads are projected over the chest.  There is no airspace disease.  No pleural effusion.  Aortic atherosclerosis. Small high area of pulmonary parenchymal scarring lateral to the left hilum appears similar to the prior exams.  No displaced rib fractures. No pneumothorax.  IMPRESSION: Chronic changes of the chest without acute cardiopulmonary disease.   Original Report Authenticated By: Andreas Newport, M.D.    Ct Head Wo Contrast  07/15/2012  *RADIOLOGY REPORT*  Clinical Data: The patient fell hitting head.  Positive loss of consciousness with a frontal scalp laceration.  CT HEAD WITHOUT CONTRAST,CT CERVICAL SPINE WITHOUT CONTRAST  Technique:  Contiguous axial images were obtained from the base of the skull through the vertex without contrast.,Technique: Multidetector CT imaging of the cervical spine was performed. Multiplanar CT image reconstructions were also generated.  Comparison: None.  Findings: Head CT:  The ventricles are normal in configuration. There is ventricular and sulcal enlargement reflecting moderate age related atrophy.  No parenchymal masses or mass effect.  Mild patchy white matter hypoattenuation is noted most consistent with chronic microvascular ischemic change.  There is no evidence of a recent infarct.  No intracranial hemorrhage.  No extra-axial masses or abnormal fluid collections.  No skull fracture.  The visualized sinuses and mastoid air cells are clear.  There is a fracture of the base of the left nasal bone.  This could be chronic or acute.  Cervical  spine CT:  There is no fracture or spondylolisthesis. There is some resorption at the tip of the odontoid process that appears chronic.  There is loss of disc height with endplate osteophytes throughout most of the cervical spine with  endplate spurring and facet spurring leading to varying degrees of neural foraminal narrowing.  Neural foraminal narrowing is greatest bilaterally at C5-C6, moderate to severe bilaterally.  The bones are demineralized.  The surrounding soft tissues are unremarkable.  The lung apices are clear.  IMPRESSION: Head CT:  No acute intracranial abnormality.  Fracture at the base of the left nasal bone may be acute or chronic.  Cervical spine CT:  No fracture or acute finding   Original Report Authenticated By: Amie Portland, M.D.    Ct Cervical Spine Wo Contrast  07/15/2012  *RADIOLOGY REPORT*  Clinical Data: The patient fell hitting head.  Positive loss of consciousness with a frontal scalp laceration.  CT HEAD WITHOUT CONTRAST,CT CERVICAL SPINE WITHOUT CONTRAST  Technique:  Contiguous axial images were obtained from the base of the skull through the vertex without contrast.,Technique: Multidetector CT imaging of the cervical spine was performed. Multiplanar CT image reconstructions were also generated.  Comparison: None.  Findings: Head CT:  The ventricles are normal in configuration. There is ventricular and sulcal enlargement reflecting moderate age related atrophy.  No parenchymal masses or mass effect.  Mild patchy white matter hypoattenuation is noted most consistent with chronic microvascular ischemic change.  There is no evidence of a recent infarct.  No intracranial hemorrhage.  No extra-axial masses or abnormal fluid collections.  No skull fracture.  The visualized sinuses and mastoid air cells are clear.  There is a fracture of the base of the left nasal bone.  This could be chronic or acute.  Cervical spine CT:  There is no fracture or spondylolisthesis. There is some resorption at the tip of the odontoid process that appears chronic.  There is loss of disc height with endplate osteophytes throughout most of the cervical spine with endplate spurring and facet spurring leading to varying degrees of neural  foraminal narrowing.  Neural foraminal narrowing is greatest bilaterally at C5-C6, moderate to severe bilaterally.  The bones are demineralized.  The surrounding soft tissues are unremarkable.  The lung apices are clear.  IMPRESSION: Head CT:  No acute intracranial abnormality.  Fracture at the base of the left nasal bone may be acute or chronic.  Cervical spine CT:  No fracture or acute finding   Original Report Authenticated By: Amie Portland, M.D.    Dg Chest Portable 1 View  07/15/2012  *RADIOLOGY REPORT*  Clinical Data: Fall.  Head injury  PORTABLE CHEST - 1 VIEW  Comparison: .  07/15/2012.  Findings: Rotation.  Tortuous aorta.  Cardiomegaly.  Central pulmonary vascular prominence.  No segmental consolidation, pulmonary edema or gross pneumothorax.  IMPRESSION:  Tortuous aorta.  Cardiomegaly.  Central pulmonary vascular prominence.  No segmental consolidation, pulmonary edema or gross pneumothorax.   Original Report Authenticated By: Lacy Duverney, M.D.    Nursing notes, applicable records and vitals reviewed.  Radiologic Images/Reports reviewed.   1. STEMI (ST elevation myocardial infarction)   2. Head injury   3. Laceration of scalp   4. Prostatitis   5. Fall       MDM  Fall with head injury. No evidence for syncope. Patient improved, with treatment in the ED. No clear source of infection, with most likely being prostatitis. Sudden worsening in ED, with Acute MI, and  EKG changes c/w STEMI. Pt developed unstable cardiac rhythm and was chemically and cardioversion controlled. He required urgent assessment by Cardiology services, and diagnostic/therapeutic cardiac catheterization.    Plan: Admit- Admit to Cardiology.   Note to care team: Staple removal recommended, in 10 days       Flint Melter, MD 07/16/12 1031

## 2012-07-15 NOTE — ED Notes (Signed)
Patient transported to X-ray 

## 2012-07-16 ENCOUNTER — Inpatient Hospital Stay (HOSPITAL_COMMUNITY): Payer: Medicare Other

## 2012-07-16 DIAGNOSIS — S0191XA Laceration without foreign body of unspecified part of head, initial encounter: Secondary | ICD-10-CM | POA: Diagnosis present

## 2012-07-16 DIAGNOSIS — I2109 ST elevation (STEMI) myocardial infarction involving other coronary artery of anterior wall: Secondary | ICD-10-CM | POA: Diagnosis present

## 2012-07-16 DIAGNOSIS — N419 Inflammatory disease of prostate, unspecified: Secondary | ICD-10-CM | POA: Diagnosis present

## 2012-07-16 DIAGNOSIS — N390 Urinary tract infection, site not specified: Secondary | ICD-10-CM | POA: Diagnosis present

## 2012-07-16 DIAGNOSIS — D72829 Elevated white blood cell count, unspecified: Secondary | ICD-10-CM | POA: Diagnosis present

## 2012-07-16 LAB — CBC
HCT: 34.7 % — ABNORMAL LOW (ref 39.0–52.0)
Hemoglobin: 11.4 g/dL — ABNORMAL LOW (ref 13.0–17.0)
MCV: 98.6 fL (ref 78.0–100.0)
RDW: 13.3 % (ref 11.5–15.5)
WBC: 13.8 10*3/uL — ABNORMAL HIGH (ref 4.0–10.5)

## 2012-07-16 LAB — BASIC METABOLIC PANEL
BUN: 19 mg/dL (ref 6–23)
Chloride: 103 mEq/L (ref 96–112)
Creatinine, Ser: 1.26 mg/dL (ref 0.50–1.35)
GFR calc Af Amer: 58 mL/min — ABNORMAL LOW (ref >90)
Glucose, Bld: 117 mg/dL — ABNORMAL HIGH (ref 70–99)

## 2012-07-16 LAB — TSH: TSH: 2.667 u[IU]/mL (ref 0.350–4.500)

## 2012-07-16 LAB — TROPONIN I: Troponin I: 1.97 ng/mL (ref <0.30)

## 2012-07-16 LAB — CK TOTAL AND CKMB (NOT AT ARMC)
Relative Index: 1.1 (ref 0.0–2.5)
Total CK: 407 U/L — ABNORMAL HIGH (ref 7–232)

## 2012-07-16 LAB — HEMOGLOBIN A1C
Hgb A1c MFr Bld: 5.8 % — ABNORMAL HIGH (ref <5.7)
Mean Plasma Glucose: 120 mg/dL — ABNORMAL HIGH (ref <117)

## 2012-07-16 MED ORDER — ACETAMINOPHEN 325 MG PO TABS
650.0000 mg | ORAL_TABLET | Freq: Once | ORAL | Status: AC
  Administered 2012-07-16: 650 mg via ORAL
  Filled 2012-07-16: qty 2

## 2012-07-16 MED ORDER — LEVALBUTEROL HCL 0.63 MG/3ML IN NEBU
0.6300 mg | INHALATION_SOLUTION | Freq: Four times a day (QID) | RESPIRATORY_TRACT | Status: DC
  Administered 2012-07-16 (×2): 0.63 mg via RESPIRATORY_TRACT
  Filled 2012-07-16 (×4): qty 3

## 2012-07-16 MED ORDER — LEVALBUTEROL HCL 0.63 MG/3ML IN NEBU: 0.6300 mg | INHALATION_SOLUTION | Freq: Four times a day (QID) | RESPIRATORY_TRACT | Status: DC | PRN

## 2012-07-16 MED ORDER — MOMETASONE FURO-FORMOTEROL FUM 100-5 MCG/ACT IN AERO
2.0000 | INHALATION_SPRAY | Freq: Two times a day (BID) | RESPIRATORY_TRACT | Status: DC
  Administered 2012-07-17 – 2012-07-20 (×7): 2 via RESPIRATORY_TRACT
  Filled 2012-07-16: qty 8.8

## 2012-07-16 MED ORDER — IPRATROPIUM BROMIDE 0.02 % IN SOLN: 0.5000 mg | Freq: Once | RESPIRATORY_TRACT | Status: DC

## 2012-07-16 MED ORDER — LEVALBUTEROL HCL 0.63 MG/3ML IN NEBU
0.6300 mg | INHALATION_SOLUTION | Freq: Four times a day (QID) | RESPIRATORY_TRACT | Status: DC | PRN
  Administered 2012-07-17: 0.63 mg via RESPIRATORY_TRACT
  Filled 2012-07-16: qty 3

## 2012-07-16 MED ORDER — ALPRAZOLAM 0.25 MG PO TABS
0.2500 mg | ORAL_TABLET | Freq: Two times a day (BID) | ORAL | Status: DC | PRN
  Administered 2012-07-16: 0.25 mg via ORAL
  Filled 2012-07-16: qty 1

## 2012-07-16 MED ORDER — TIOTROPIUM BROMIDE MONOHYDRATE 18 MCG IN CAPS
18.0000 ug | ORAL_CAPSULE | Freq: Every day | RESPIRATORY_TRACT | Status: DC
  Administered 2012-07-17 – 2012-07-18 (×2): 18 ug via RESPIRATORY_TRACT
  Filled 2012-07-16: qty 5

## 2012-07-16 MED ORDER — IPRATROPIUM BROMIDE 0.02 % IN SOLN
0.5000 mg | Freq: Four times a day (QID) | RESPIRATORY_TRACT | Status: DC
  Administered 2012-07-16: 0.5 mg via RESPIRATORY_TRACT
  Filled 2012-07-16: qty 2.5

## 2012-07-16 NOTE — Progress Notes (Signed)
CRITICAL VALUE ALERT  Critical value received: blood culture Date of notification:  07/16/2012 Time of notification:  2040  Critical value read back:yes   Nurse who received alert:  Anselm Lis  MD notified (1st page):  Tereso Newcomer  Time of first page:  2045 MD notified (2nd page):  Time of second page:  Responding MD:  Tereso Newcomer  Time MD responded:  2055

## 2012-07-16 NOTE — Progress Notes (Signed)
After pt was educated about procedure and pt's questions answered, Rt femoral arterial sheath was taken out at 2332 and pressure held at site until 2350. Hemostasis was achieved promptly and  groin is soft and non tender to touch.

## 2012-07-16 NOTE — Progress Notes (Signed)
Patient: [name] Date of Encounter: 07/16/2012, 7:25 AM Admit date: 07/15/2012     Subjective  Patient had transient episode of hypotension and bradycardia last night before sheath was pulled. Non acute EKG. His symptoms were resolved spontaneously and Amiodarone drip was started for PVCs.  Patient doing well since sheath removal. He denies chest pain, chest pressure or  Dyspnea on rest. He reports to me mild SOB when eating breakfast this am. Denies dysphagia or any other discomfort    Objective  Physical Exam: Vitals: BP 115/45  Pulse 53  Temp 97.5 F (36.4 C) (Oral)  Resp 26  Ht 5' 10.08" (1.78 m)  Wt 172 lb 6.4 oz (78.2 kg)  BMI 24.68 kg/m2  SpO2 95% General: Pleasant, NAD, frail  Psych: Normal affect.  Neuro: Alert and oriented X 3. Moves all extremities spontaneously.  HEENT: Normal, teeth are all intact  Neck: Supple without bruits or JVD.  Lungs: Resp regular and unlabored, CTA.  Heart: RRR no s3, s4, or murmurs.  Abdomen: Soft, non-tender, non-distended, BS + x 4.  Extremities: No clubbing, cyanosis or edema. DP/PT/Radials 2+ except 1+ dorsalis pedis the right lower extremity.   Intake/Output:  Intake/Output Summary (Last 24 hours) at 07/16/12 0725 Last data filed at 07/16/12 0600  Gross per 24 hour  Intake 2057.9 ml  Output    920 ml  Net 1137.9 ml    Inpatient Medications:     . [COMPLETED] albuterol  5 mg Nebulization Once  . amiodarone  300 mg Intravenous Once  . [COMPLETED] aspirin  324 mg Oral Once  . aspirin  81 mg Oral Daily  . atorvastatin  80 mg Oral q1800  . [COMPLETED] bivalirudin      . calcium-vitamin D  1 tablet Oral BID  . carbamazepine  300 mg Oral BID  . ciprofloxacin  400 mg Intravenous Q12H  . [COMPLETED] clopidogrel      . clopidogrel  75 mg Oral QAC breakfast  . famotidine  20 mg Oral Daily  . finasteride  5 mg Oral Daily  . [COMPLETED] heparin      . [COMPLETED] ipratropium  0.5 mg Nebulization Once  .  isosorbide mononitrate  60 mg Oral Daily  . [COMPLETED] lidocaine      . metoprolol succinate  50 mg Oral Daily  . [COMPLETED] nitroGLYCERIN      . [COMPLETED] nitroGLYCERIN      . [COMPLETED] nitroGLYCERIN  2-200 mcg/min Intravenous Once  . potassium chloride SA  20 mEq Oral Q M,W,F  . sertraline  150 mg Oral Daily  . [COMPLETED] sodium chloride  500 mL Intravenous Once  . [DISCONTINUED] amiodarone (CORDARONE) infusion  300 mg Intravenous Once  . [DISCONTINUED] carbamazepine  300 mg Oral BID  . [DISCONTINUED] ciprofloxacin  500 mg Oral BID  . [DISCONTINUED] clopidogrel  75 mg Oral Q breakfast      . sodium chloride 125 mL/hr at 07/16/12 0000  . [EXPIRED] sodium chloride 75 mL/hr at 07/15/12 1600  . amiodarone (NEXTERONE PREMIX) 360 mg/200 mL dextrose 30 mg/hr (07/16/12 0717)    Labs:  [facility] 07/16/12 0605 07/15/12 1854  NA 137 136  K 4.3 3.9  CL 103 103  CO2 24 19  GLUCOSE 117* 144*  BUN 19 16  CREATININE 1.26 1.04  CALCIUM 8.4 8.3*  MG -- --  PHOS -- --    Basename 07/15/12 1854  AST 49*  ALT 30  ALKPHOS 64  BILITOT 0.4  PROT 6.0  ALBUMIN 2.7*    Basename 07/16/12 0605 07/15/12 1854 07/15/12 0804  WBC 13.8* 13.0* --  NEUTROABS -- 12.5* 8.1*  HGB 11.4* 11.5* --  HCT 34.7* 33.9* --  MCV 98.6 97.4 --  PLT 168 162 --    Basename 07/16/12 0605 07/15/12 2350 07/15/12 1840 07/15/12 0805  CKTOTAL 407* -- 259* --  CKMB 4.5* -- 3.9 --  TROPONINI 1.94* 1.97* 1.22* <0.30    Radiology/Studies: Dg Chest 2 View  07/15/2012  *RADIOLOGY REPORT*  Clinical Data: Weakness.  Fall.  Head laceration.  Chest pain and shortness of breath.  CHEST - 2 VIEW  Comparison: 08/09/2011.  Findings: Chronic bronchitic changes are present at the lung bases. CABG/median sternotomy.  Cardiopericardial silhouette is upper limits of normal for projection and appears similar to the prior exam. Monitoring leads are projected over the chest.  There is no airspace disease.  No pleural  effusion.  Aortic atherosclerosis. Small high area of pulmonary parenchymal scarring lateral to the left hilum appears similar to the prior exams.  No displaced rib fractures. No pneumothorax.  IMPRESSION: Chronic changes of the chest without acute cardiopulmonary disease.   Original Report Authenticated By: Andreas Newport, M.D.    Ct Head Wo Contrast  07/15/2012  *RADIOLOGY REPORT*  Clinical Data: The patient fell hitting head.  Positive loss of consciousness with a frontal scalp laceration.  CT HEAD WITHOUT CONTRAST,CT CERVICAL SPINE WITHOUT CONTRAST  Technique:  Contiguous axial images were obtained from the base of the skull through the vertex without contrast.,Technique: Multidetector CT imaging of the cervical spine was performed. Multiplanar CT image reconstructions were also generated.  Comparison: None.  Findings: Head CT:  The ventricles are normal in configuration. There is ventricular and sulcal enlargement reflecting moderate age related atrophy.  No parenchymal masses or mass effect.  Mild patchy white matter hypoattenuation is noted most consistent with chronic microvascular ischemic change.  There is no evidence of a recent infarct.  No intracranial hemorrhage.  No extra-axial masses or abnormal fluid collections.  No skull fracture.  The visualized sinuses and mastoid air cells are clear.  There is a fracture of the base of the left nasal bone.  This could be chronic or acute.  Cervical spine CT:  There is no fracture or spondylolisthesis. There is some resorption at the tip of the odontoid process that appears chronic.  There is loss of disc height with endplate osteophytes throughout most of the cervical spine with endplate spurring and facet spurring leading to varying degrees of neural foraminal narrowing.  Neural foraminal narrowing is greatest bilaterally at C5-C6, moderate to severe bilaterally.  The bones are demineralized.  The surrounding soft tissues are unremarkable.  The lung apices  are clear.  IMPRESSION: Head CT:  No acute intracranial abnormality.  Fracture at the base of the left nasal bone may be acute or chronic.  Cervical spine CT:  No fracture or acute finding   Original Report Authenticated By: Amie Portland, M.D.    Ct Cervical Spine Wo Contrast  07/15/2012  *RADIOLOGY REPORT*  Clinical Data: The patient fell hitting head.  Positive loss of consciousness with a frontal scalp laceration.  CT HEAD WITHOUT CONTRAST,CT CERVICAL SPINE WITHOUT CONTRAST  Technique:  Contiguous axial images were obtained from the base of the skull through the vertex without contrast.,Technique: Multidetector CT imaging of the cervical spine was performed. Multiplanar CT image reconstructions were also generated.  Comparison: None.  Findings: Head CT:  The ventricles are  normal in configuration. There is ventricular and sulcal enlargement reflecting moderate age related atrophy.  No parenchymal masses or mass effect.  Mild patchy white matter hypoattenuation is noted most consistent with chronic microvascular ischemic change.  There is no evidence of a recent infarct.  No intracranial hemorrhage.  No extra-axial masses or abnormal fluid collections.  No skull fracture.  The visualized sinuses and mastoid air cells are clear.  There is a fracture of the base of the left nasal bone.  This could be chronic or acute.  Cervical spine CT:  There is no fracture or spondylolisthesis. There is some resorption at the tip of the odontoid process that appears chronic.  There is loss of disc height with endplate osteophytes throughout most of the cervical spine with endplate spurring and facet spurring leading to varying degrees of neural foraminal narrowing.  Neural foraminal narrowing is greatest bilaterally at C5-C6, moderate to severe bilaterally.  The bones are demineralized.  The surrounding soft tissues are unremarkable.  The lung apices are clear.  IMPRESSION: Head CT:  No acute intracranial abnormality.   Fracture at the base of the left nasal bone may be acute or chronic.  Cervical spine CT:  No fracture or acute finding   Original Report Authenticated By: Amie Portland, M.D.    Dg Chest Portable 1 View  07/15/2012  *RADIOLOGY REPORT*  Clinical Data: Fall.  Head injury  PORTABLE CHEST - 1 VIEW  Comparison: .  07/15/2012.  Findings: Rotation.  Tortuous aorta.  Cardiomegaly.  Central pulmonary vascular prominence.  No segmental consolidation, pulmonary edema or gross pneumothorax.  IMPRESSION:  Tortuous aorta.  Cardiomegaly.  Central pulmonary vascular prominence.  No segmental consolidation, pulmonary edema or gross pneumothorax.   Original Report Authenticated By: Lacy Duverney, M.D.     Echocardiogram: Telemetry: NSR, LBBB   Assessment and Plan  78 year old white male with PMH of CAD/MI/CABG x 2 /CHF with EF 60-65% in 2010, HTN, HLD, COPD and prostate cancer presents with a fall, subsequent chest pain, and sustained V. tach and lost pulse requiring DC cardioversion 120 J by transcutaneous pads. He is baseline interventricular conduction delay but ST segments elevated anterio- laterally on Leads I, AVL, V1-3.  Cath on 07/15/12-  1. Moderate disease in the left main coronary artery. 2. Occluded proximal left anterior descending artery. LIMA to LAD is widely patent. 90% mid LAD lesion was successfully stented with a 2.5 x 15 vision bare metal stent. 3. Moderate disease in the proximal left circumflex. Large OM 2 is occluded and fills retrograde, by left to left collaterals. 4. Occluded native proximal right coronary artery. Patent free RIMA to distal RCA. Moderate disease in the posterior lateral artery. 5. Overall normal left ventricular systolic function. Mild, mid anterior hypokinesis. LVEDP 11 mmHg. Ejection fraction 50 %.  1. Acute STEMI, anterial lateral Mikail Goostree. Mildly elevated troponin 1.94< 1.97< 1.22)     Cath and BMS to mild LAD 07/15/12. Tolerated well. Good EF 50%  - ASA, Plavix, Statin,  Betablocker, Nitrates ( Isosorbide) - risk stratification--LDL goal < 70, BP < 140/90, < 130/80 if DM or CKD, TSH, HBA1C, exercise and weight control. - Will D/C Amiodarone gtt ( had x 16 hours and no PVCs noted) and no oral for now-- if need antiarrhythmic medication in future, avoid Amiodarone give history of lung disease.   2. Head laceration 2/2 fall, sutured and stapled at ED on 07/15/12     Will need to remove sutures and Staple in ~ 1  weeks.   3. Prostitis/UTI--Cipro  4. Leukocytosis, likely associated with de margination and/or AMI, Mild fever on admission. Aferile > 12 hours.  No leukocytosis on admission in the setting of self-reported fever of 103 F and possible diagnosis of prostatitis at home. ABX started for Prostatitis/UTI on admission.   5. Elevated transaminase, likely associated with AMI. Will trend  6. COPD. Only on PRN Atrovent neb at home   Never had PFTs or pulmonary followup. He reports that his SOB is at his baseline and does not wear O2 at home.  -  O2 - Bronchodilator -Xopenex and Atrovent PRN - Will start inhalers-- Dulera (Steroids/LABA)  and Spiriva ( LA anticholinergic)  - will need evaluation of his Oxygen status upon discharge ( rest and ambulating O2 Sat to determine whether he needs home O2) - Will need PFT as outpatient in near future - He will benefit from cardiopulmonary rehab as outpatient.   5. HTN, on Beta blocker and Nitrates (Home meds)  6. HLD, LDL 106, Goal is < 70. On Lipitor  7. Code status: Full  8. VTE: SCDs  Patient reports mild SOB which is at his baseline. He is asymptomatic at rest. No s/s fluid overload and his EF 50%. No chest pain or chest pressure. Non acute PCXR. Given his history of COPD with only treatment of Atrovent Neb, the etiology of his SOB is most likely associated with his COPD. See above discussion  Signed, LI, NA PGY-2 7:25 AM   Patient examined and agree except changes made. Discussed plan with patient and  family.  Valera Castle, MD 08/02/2012 9:49 AM

## 2012-07-17 ENCOUNTER — Inpatient Hospital Stay (HOSPITAL_COMMUNITY): Payer: Medicare Other

## 2012-07-17 DIAGNOSIS — I219 Acute myocardial infarction, unspecified: Secondary | ICD-10-CM

## 2012-07-17 DIAGNOSIS — R6510 Systemic inflammatory response syndrome (SIRS) of non-infectious origin without acute organ dysfunction: Secondary | ICD-10-CM

## 2012-07-17 DIAGNOSIS — I509 Heart failure, unspecified: Secondary | ICD-10-CM

## 2012-07-17 DIAGNOSIS — N390 Urinary tract infection, site not specified: Secondary | ICD-10-CM

## 2012-07-17 DIAGNOSIS — R7881 Bacteremia: Secondary | ICD-10-CM

## 2012-07-17 DIAGNOSIS — J449 Chronic obstructive pulmonary disease, unspecified: Secondary | ICD-10-CM

## 2012-07-17 LAB — HEPATIC FUNCTION PANEL
AST: 60 U/L — ABNORMAL HIGH (ref 0–37)
Albumin: 2.5 g/dL — ABNORMAL LOW (ref 3.5–5.2)
Total Bilirubin: 0.2 mg/dL — ABNORMAL LOW (ref 0.3–1.2)

## 2012-07-17 LAB — BASIC METABOLIC PANEL
Chloride: 103 mEq/L (ref 96–112)
GFR calc Af Amer: 69 mL/min — ABNORMAL LOW (ref >90)
GFR calc non Af Amer: 60 mL/min — ABNORMAL LOW (ref >90)
Glucose, Bld: 115 mg/dL — ABNORMAL HIGH (ref 70–99)
Potassium: 4 mEq/L (ref 3.5–5.1)
Sodium: 136 mEq/L (ref 135–145)

## 2012-07-17 LAB — POCT ACTIVATED CLOTTING TIME: Activated Clotting Time: 299 seconds

## 2012-07-17 LAB — URINE CULTURE

## 2012-07-17 LAB — CBC
HCT: 32.4 % — ABNORMAL LOW (ref 39.0–52.0)
Hemoglobin: 10.9 g/dL — ABNORMAL LOW (ref 13.0–17.0)
RBC: 3.33 MIL/uL — ABNORMAL LOW (ref 4.22–5.81)

## 2012-07-17 MED ORDER — PIPERACILLIN-TAZOBACTAM 3.375 G IVPB
3.3750 g | Freq: Three times a day (TID) | INTRAVENOUS | Status: DC
  Administered 2012-07-17 – 2012-07-18 (×4): 3.375 g via INTRAVENOUS
  Filled 2012-07-17 (×6): qty 50

## 2012-07-17 MED ORDER — BIOTENE DRY MOUTH MT LIQD
15.0000 mL | Freq: Two times a day (BID) | OROMUCOSAL | Status: DC
  Administered 2012-07-17 – 2012-07-22 (×10): 15 mL via OROMUCOSAL

## 2012-07-17 MED ORDER — LEVALBUTEROL HCL 0.63 MG/3ML IN NEBU
0.6300 mg | INHALATION_SOLUTION | RESPIRATORY_TRACT | Status: DC
  Administered 2012-07-17 – 2012-07-21 (×24): 0.63 mg via RESPIRATORY_TRACT
  Filled 2012-07-17 (×30): qty 3

## 2012-07-17 MED ORDER — PIPERACILLIN-TAZOBACTAM 3.375 G IVPB: 3.3750 g | Freq: Four times a day (QID) | INTRAVENOUS | Status: DC

## 2012-07-17 MED FILL — Medication: Qty: 1 | Status: AC

## 2012-07-17 NOTE — Progress Notes (Signed)
Patient: [name] Date of Encounter: 07/17/2012, 6:47 AM Admit date: 07/15/2012     Subjective  Patient was noted to have elevated temp 102.1 yesterday afternoon. Blood cultures overnight reports GNR 2/2. On Cipro now. Patient continues to reports SOB and wheezing. States that his symptoms are a little better with Bronchodilator therapy.    Objective  Physical Exam: Vitals: BP 137/56  Pulse 76  Temp 99.6 F (37.6 C) (Oral)  Resp 24  Ht 5' 10.08" (1.78 m)  Wt 173 lb 8 oz (78.7 kg)  BMI 24.84 kg/m2  SpO2 92% General: Pleasant, NAD, frail .  He feels very warm to the touch ( I suspect his temp is higher than the above 99.6) Psych: Normal affect.  Neuro: Alert and oriented X 3. Moves all extremities spontaneously.  HEENT: Normal, teeth are all intact  Neck: Supple without bruits or JVD.  Lungs: Bibasialr lungs sound diminished with rales noted. Tight wheezing noted. Heart: RRR no s3, s4, or murmurs.  Abdomen: Soft, non-tender, non-distended, BS + x 4.  Extremities: No clubbing, cyanosis or edema. DP/PT/Radials 2+ except 1+ dorsalis pedis the right lower extremity.   Intake/Output:  Intake/Output Summary (Last 24 hours) at 07/17/12 0647 Last data filed at 07/17/12 0600  Gross per 24 hour  Intake 1646.8 ml  Output    985 ml  Net  661.8 ml    Inpatient Medications:     . [COMPLETED] acetaminophen  650 mg Oral Once  . amiodarone  300 mg Intravenous Once  . aspirin  81 mg Oral Daily  . atorvastatin  80 mg Oral q1800  . calcium-vitamin D  1 tablet Oral BID  . carbamazepine  300 mg Oral BID  . clopidogrel  75 mg Oral QAC breakfast  . famotidine  20 mg Oral Daily  . finasteride  5 mg Oral Daily  . isosorbide mononitrate  60 mg Oral Daily  . metoprolol succinate  50 mg Oral Daily  . mometasone-formoterol  2 puff Inhalation BID  . piperacillin-tazobactam (ZOSYN)  IV  3.375 g Intravenous Q6H  . potassium chloride SA  20 mEq Oral Q M,W,F  . sertraline  150 mg  Oral Daily  . tiotropium  18 mcg Inhalation Daily  . [DISCONTINUED] ciprofloxacin  400 mg Intravenous Q12H  . [DISCONTINUED] ipratropium  0.5 mg Nebulization Q6H  . [DISCONTINUED] ipratropium  0.5 mg Nebulization Once  . [DISCONTINUED] levalbuterol  0.63 mg Nebulization Q6H      . [EXPIRED] amiodarone (NEXTERONE PREMIX) 360 mg/200 mL dextrose 30 mg/hr (07/16/12 0717)  . [DISCONTINUED] sodium chloride 125 mL/hr at 07/16/12 0000    Labs:  [facility] 07/17/12 0520 07/16/12 0605  NA 136 137  K 4.0 4.3  CL 103 103  CO2 26 24  GLUCOSE 115* 117*  BUN 21 19  CREATININE 1.09 1.26  CALCIUM 8.7 8.4  MG -- --  PHOS -- --    Basename 07/17/12 0520 07/15/12 1854  AST 60* 49*  ALT 35 30  ALKPHOS 56 64  BILITOT 0.2* 0.4  PROT 6.0 6.0  ALBUMIN 2.5* 2.7*    Basename 07/17/12 0520 07/16/12 0605 07/15/12 1854 07/15/12 0804  WBC 7.4 13.8* -- --  NEUTROABS -- -- 12.5* 8.1*  HGB 10.9* 11.4* -- --  HCT 32.4* 34.7* -- --  MCV 97.3 98.6 -- --  PLT 175 168 -- --    Basename 07/16/12 0605 07/15/12 2350 07/15/12 1840 07/15/12 0805  CKTOTAL 407* -- 259* --  CKMB 4.5* -- 3.9 --  TROPONINI 1.94* 1.97* 1.22* <0.30    Basename 07/16/12 1028  HGBA1C 5.8*    Basename 07/16/12 1028  TSH 2.667  T4TOTAL --  T3FREE --  THYROIDAB --    Radiology/Studies: Dg Chest 2 View  07/15/2012  *RADIOLOGY REPORT*  Clinical Data: Weakness.  Fall.  Head laceration.  Chest pain and shortness of breath.  CHEST - 2 VIEW  Comparison: 08/09/2011.  Findings: Chronic bronchitic changes are present at the lung bases. CABG/median sternotomy.  Cardiopericardial silhouette is upper limits of normal for projection and appears similar to the prior exam. Monitoring leads are projected over the chest.  There is no airspace disease.  No pleural effusion.  Aortic atherosclerosis. Small high area of pulmonary parenchymal scarring lateral to the left hilum appears similar to the prior exams.  No displaced rib fractures. No  pneumothorax.  IMPRESSION: Chronic changes of the chest without acute cardiopulmonary disease.   Original Report Authenticated By: Andreas Newport, M.D.    Ct Head Wo Contrast  07/15/2012  *RADIOLOGY REPORT*  Clinical Data: The patient fell hitting head.  Positive loss of consciousness with a frontal scalp laceration.  CT HEAD WITHOUT CONTRAST,CT CERVICAL SPINE WITHOUT CONTRAST  Technique:  Contiguous axial images were obtained from the base of the skull through the vertex without contrast.,Technique: Multidetector CT imaging of the cervical spine was performed. Multiplanar CT image reconstructions were also generated.  Comparison: None.  Findings: Head CT:  The ventricles are normal in configuration. There is ventricular and sulcal enlargement reflecting moderate age related atrophy.  No parenchymal masses or mass effect.  Mild patchy white matter hypoattenuation is noted most consistent with chronic microvascular ischemic change.  There is no evidence of a recent infarct.  No intracranial hemorrhage.  No extra-axial masses or abnormal fluid collections.  No skull fracture.  The visualized sinuses and mastoid air cells are clear.  There is a fracture of the base of the left nasal bone.  This could be chronic or acute.  Cervical spine CT:  There is no fracture or spondylolisthesis. There is some resorption at the tip of the odontoid process that appears chronic.  There is loss of disc height with endplate osteophytes throughout most of the cervical spine with endplate spurring and facet spurring leading to varying degrees of neural foraminal narrowing.  Neural foraminal narrowing is greatest bilaterally at C5-C6, moderate to severe bilaterally.  The bones are demineralized.  The surrounding soft tissues are unremarkable.  The lung apices are clear.  IMPRESSION: Head CT:  No acute intracranial abnormality.  Fracture at the base of the left nasal bone may be acute or chronic.  Cervical spine CT:  No fracture or  acute finding   Original Report Authenticated By: Amie Portland, M.D.    Ct Cervical Spine Wo Contrast  07/15/2012  *RADIOLOGY REPORT*  Clinical Data: The patient fell hitting head.  Positive loss of consciousness with a frontal scalp laceration.  CT HEAD WITHOUT CONTRAST,CT CERVICAL SPINE WITHOUT CONTRAST  Technique:  Contiguous axial images were obtained from the base of the skull through the vertex without contrast.,Technique: Multidetector CT imaging of the cervical spine was performed. Multiplanar CT image reconstructions were also generated.  Comparison: None.  Findings: Head CT:  The ventricles are normal in configuration. There is ventricular and sulcal enlargement reflecting moderate age related atrophy.  No parenchymal masses or mass effect.  Mild patchy white matter hypoattenuation is noted most consistent with chronic microvascular ischemic change.  There is no evidence of a recent infarct.  No intracranial hemorrhage.  No extra-axial masses or abnormal fluid collections.  No skull fracture.  The visualized sinuses and mastoid air cells are clear.  There is a fracture of the base of the left nasal bone.  This could be chronic or acute.  Cervical spine CT:  There is no fracture or spondylolisthesis. There is some resorption at the tip of the odontoid process that appears chronic.  There is loss of disc height with endplate osteophytes throughout most of the cervical spine with endplate spurring and facet spurring leading to varying degrees of neural foraminal narrowing.  Neural foraminal narrowing is greatest bilaterally at C5-C6, moderate to severe bilaterally.  The bones are demineralized.  The surrounding soft tissues are unremarkable.  The lung apices are clear.  IMPRESSION: Head CT:  No acute intracranial abnormality.  Fracture at the base of the left nasal bone may be acute or chronic.  Cervical spine CT:  No fracture or acute finding   Original Report Authenticated By: Amie Portland, M.D.    Dg  Chest Port 1 View  07/16/2012  *RADIOLOGY REPORT*  Clinical Data: Shortness of breath.  PORTABLE CHEST - 1 VIEW  Comparison: Chest x-ray 07/15/2012.  Findings: Lung volumes are low.  No definite consolidative airspace disease.  No definite pleural effusions.  No evidence of pulmonary edema.  Heart size is borderline enlarged (likely accentuated by low lung volumes).  Mediastinal contours are unremarkable. Atherosclerosis in the thoracic aorta.  Status post median sternotomy for CABG.  Transcutaneous defibrillator pads are seen projecting over the left hemithorax.  IMPRESSION: 1.  Low lung volumes without radiographic evidence of acute cardiopulmonary disease. 2.  Atherosclerosis. 3.  Postoperative changes and support apparatus, as above.   Original Report Authenticated By: Trudie Reed, M.D.    Dg Chest Portable 1 View  07/15/2012  *RADIOLOGY REPORT*  Clinical Data: Fall.  Head injury  PORTABLE CHEST - 1 VIEW  Comparison: .  07/15/2012.  Findings: Rotation.  Tortuous aorta.  Cardiomegaly.  Central pulmonary vascular prominence.  No segmental consolidation, pulmonary edema or gross pneumothorax.  IMPRESSION:  Tortuous aorta.  Cardiomegaly.  Central pulmonary vascular prominence.  No segmental consolidation, pulmonary edema or gross pneumothorax.   Original Report Authenticated By: Lacy Duverney, M.D.    Telemetry: NSR, LBBB   Assessment and Plan   78 year old white male with PMH of CAD/MI/CABG x 2 /CHF with EF 60-65% in 2010, HTN, HLD, COPD and prostate cancer presents with a fall, subsequent chest pain, and sustained V. tach and lost pulse requiring DC cardioversion 120 J by transcutaneous pads. He is baseline interventricular conduction delay but ST segments elevated anterio- laterally on Leads I, AVL, V1-3.  Cath on 07/15/12-  1. Moderate disease in the left main coronary artery. 2. Occluded proximal left anterior descending artery. LIMA to LAD is widely patent. 90% mid LAD lesion was successfully  stented with a 2.5 x 15 vision bare metal stent. 3. Moderate disease in the proximal left circumflex. Large OM 2 is occluded and fills retrograde, by left to left collaterals. 4. Occluded native proximal right coronary artery. Patent free RIMA to distal RCA. Moderate disease in the posterior lateral artery. 5. Overall normal left ventricular systolic function. Mild, mid anterior hypokinesis. LVEDP 11 mmHg. Ejection fraction 50 %. 6.  1. Acute STEMI, anterial lateral wall. Mildly elevated troponin 1.94< 1.97< 1.22)  Cath and BMS to mild LAD 07/15/12. Tolerated well. Good EF 50%   -  ASA, Plavix, Statin, Betablocker, Nitrates ( Isosorbide)  - risk stratification--LDL goal < 70, BP < 140/90, < 130/80 if DM or CKD, TSH, HBA1C, exercise and weight control.  - off amiodarone drip. No ectopies.   2. SIRS and Bacteremia, GNR    Blood culture 2/2 positive for GNR. Fever 102.1 on Cipro yesterday. WBC trending down. No acidosis noted. - D/C Cipro and change it to Zosyn. Pending sensitivity to further narrow.   3. Prostitis/UTI--Zosyn  4. Leukocytosis, likely associated with bacteremia, de margination and AMI,  5. Head laceration 2/2 fall, sutured and stapled at ED on 07/15/12  Will need to remove sutures and Staple in ~ 1 weeks.   6. Elevated transaminase, likely associated with AMI. Will trend   7. COPD. Only on PRN Atrovent neb at home  Never had PFTs or pulmonary followup. He reports that his SOB is at his baseline and does not wear O2 at home.   - O2  - Bronchodilator -Xopenex and Atrovent PRN  - Dulera (Steroids/LABA) and Spiriva ( LA anticholinergic)  - will need evaluation of his home oxygen needs upon discharge ( rest and ambulating O2 Sat) - Will need PFT as outpatient in near future  - He will benefit from cardiopulmonary rehab as outpatient.   8. HTN, on Beta blocker and Nitrates (Home meds)   9. HLD, LDL 106, Goal is < 70. On Lipitor   10. Prediabetes. HB A1C 5.8  11. Code  status: Full   12. VTE: SCDs  Patient denies chest pain or chest pressure. Reports SOB and wheezing. Fever 102.1 noted yesterday with positive blood cultures 2/2 for GNR. Wheezing and rales noted on exam today. His QTc 502/484 yesterday. Will D/C Cipro and start Zosyn for more broaden coverage for bacteremia and avoid QT prolongation. Will repeat PCXR today for evaluation of his respiratory symptoms.    His is stable at cardiac stand point.   Signed, LI, NA PGY-2 7:09 AM  Attending Note:   The patient was seen and examined.  Agree with assessment and plan as noted above.  Changes made to the H&P as needed.  Pt remains quite uncomfortable from a pulmonary standpoint.  He is working fairly hard to breath.  Stable from CAD standpoint - no angina.  SIRS:  Will ask PCCM to help [place] manage his COPD.  I do not think we should transfer him out of the ICU yet given his pulmonary issues.     His recheck temp is 102.6.    Vesta Mixer, [name]., MD, [facility] 07/17/2012, 8:34 AM

## 2012-07-17 NOTE — Progress Notes (Signed)
[id number] Introduced self to pt and let him know we will walk with him as activity progresses. Education with pt and wife started. Reviewed NTG use, plavix,stent, MI restrictions. Gave MI booklet. Will followup tomorrow. Pt states he does not need foot splint to walk in hospital. Westley Hummer DunlapRN

## 2012-07-17 NOTE — Care Management Note (Addendum)
    Page 1 of 1   07/20/2012     11:29:52 AM   CARE MANAGEMENT NOTE 07/20/2012  Patient:  [name], [name]   Account Number:  [id number]  Date Initiated:  07/17/2012  Documentation initiated by:  Junius Creamer  Subjective/Objective Assessment:   adm w mi     Action/Plan:   lives w wife, pcp dr Tinnie Gens todd      DC Planning Services  CM consult      [facility] [place] Choice  HOME HEALTH   Choice offered to / List presented to:  C-1 Patient    HH arranged  HH-1 RN  HH-10 DISEASE MANAGEMENT  HH-2 PT      HH agency  [facility]   Per UR Regulation:  Reviewed for med. necessity/level of care/duration of stay  Comments:  07/20/12 1100 Haitham Dolinsky RN BSN MSN CCM Discussed post-d/c options with pt and spouse.  Pt requests home health RN and PT safety eval as he prefers to remain home until he feels stronger, reports that [facility] community case manager will assist with arrangements for transportation to cardiac rehab in the future.  Provided list of [facility] home health agencies, pt states [facility] provided services previously and he requests referral to that agency.  [facility] liaison notified. Will assess need for home O2 tonight and in a.m.  11/18 10:17a debbie dowell rn,bsn [phone]

## 2012-07-17 NOTE — Consult Note (Signed)
Name: [name] MRN: [id number] DOB: 03-30-1925    LOS: 2  Referring Physician: Dr. Elease Hashimoto, Cardiology  History of Present Illness: [name] is a 78 year old man with a PMH significant for CAD, prior MI, CABG x2, chronic diastolic CHF with an EF of 60-65% in 2010, HTN, HLD, COPD, and prostate cancer s/p TURP x2 and radiation therapy sometime prior to 2002.  He presented to the [facility] ED on 11/16 after a fall at home during which he struck his head and had a laceration.  During his ED stay he developed progressive chest pain and was noted to have ST elevation in leads I, aVL, V2 and V3.  He subsequently developed V tach and was give 300 mg IV amiodarone and was converted with DC cardioversion.  He was then urgently taken to the cath lab by Dr. Eldridge Dace which found diffuse disease with a 90% mid LAD lesion that was stented with a BMS.  He had patent grafts.  EF of 50% on the catheterization.    Since admission he has had some mild progressive SOB.  He has a diagnosis of COPD in his chart but no previous PFTs.  He uses atrovent at home and states that over the last several weeks he has had to increase his usage of the atrovent to 2-3 times daily.  He denies cough but has had fevers and dyuria for several days prior to admission.  He states that he sometimes get short of breath with chest pain when he would have fevers or when he walks around his block.  He denies PND or orthopnea.    Lines / Drains: none  Cultures: Urine 11/16: >100k E. Coli sensitivities pending BCx2 11/16: GNR UC 11/17: >100k E. Coli  Sensitivites pending BCx2 11/17: NGTD  Antibiotics: Cipro: 11/16 --> 11/18 Zosyn: 11/18  Tests / Events: Cath 11/16:  1. Moderate disease in the left main coronary artery. 2. Occluded proximal left anterior descending artery. LIMA to LAD is widely patent. 90% mid LAD lesion was successfully stented with a 2.5 x 15 vision bare metal stent. 3. Moderate disease in the proximal left  circumflex. Large OM 2 is occluded and fills retrograde, by left to left collaterals. 4. Occluded native proximal right coronary artery. Patent free RIMA to distal RCA. Moderate disease in the posterior lateral artery. 5. Overall normal left ventricular systolic function. Mild, mid anterior hypokinesis. LVEDP 11 mmHg. Ejection fraction 50 %.  Past Medical History  Diagnosis Date  . Ischemic heart disease   . Myocardial infarction   . CHF (congestive heart failure)   . Hypoxia     chronic  . Angina     when walking  . Dizziness     with low blood pressure  . Dyslipidemia (high LDL; low HDL)   . Hypertension   . Coronary artery disease   . Heart murmur   . Peripheral vascular disease     thrombus- in leg- many yrs. ago- ?R- coumadin x1 mth  . Pneumonia     hosp. long time ago   . Shortness of breath   . COPD (chronic obstructive pulmonary disease)     Dec. 2010- d/c O2(had been using for 2 mths)  . Blood transfusion     post CABG  . Hepatitis     hepatitis- long time ago, occupational contamination   . GERD (gastroesophageal reflux disease)   . Cancer     prostate - Ca- radiation therapy-  2002  . Peripheral neuropathy   . Hiatal hernia   . Arthritis     hnp, lumbar, ankle (foot drop-R), wears a splint, arthritis - "all over", low bone density   . Depression     uses Zoloft    Past Surgical History  Procedure Date  . Coronary artery bypass graft 1986 and 1993  . Ptca 2008  . Cardiac catheterization     2010- last cath, two stents at that time  . Eye surgery     cataracts - bilateral , IOL  . Hernia repair     bilateral inguiinal repair   . Tonsillectomy   . Fracture surgery     heel- crushed -2002,  (hardware)   . Joint replacement     R knee- arthroscopic, x2, R great toe- 2 surgeries- /w hardware & thenlater fused by Dr. Lajoyce Corners    . Appendectomy   . Rhinoplasty     as a teen, followed by same procedure at 1970's   . Lumbar laminectomy/decompression  microdiscectomy 08/10/2011    Procedure: LUMBAR LAMINECTOMY/DECOMPRESSION MICRODISCECTOMY;  Surgeon: Kathaleen Maser Pool;  Location: [facility];  Service: Neurosurgery;  Laterality: Right;  RIGHT Lumbar five-sacral one LAMINECTOMY, MICRODISCECTOMY  . Lumbar wound debridement 08/22/2011    Procedure: LUMBAR WOUND DEBRIDEMENT;  Surgeon: Tia Alert;  Location: [facility];  Service: Neurosurgery;  Laterality: N/A;  Repair of CSF Leak requiring laminectomy   Prior to Admission medications   Medication Sig Start Date End Date Taking? Authorizing Provider  albuterol (PROVENTIL HFA;VENTOLIN HFA) 108 (90 BASE) MCG/ACT inhaler Inhale 2 puffs into the lungs every 6 (six) hours as needed. For shortness of breath.    Yes Historical Provider, MD  calcium-vitamin D (OSCAL WITH D 500-200) 500-200 MG-UNIT per tablet Take 1 tablet by mouth 2 (two) times daily.     Yes Historical Provider, MD  carbamazepine (CARBATROL) 300 MG 12 hr capsule Take 300 mg by mouth 2 (two) times daily.     Yes Historical Provider, MD  clopidogrel (PLAVIX) 75 MG tablet Take 75 mg by mouth daily.     Yes Historical Provider, MD  finasteride (PROSCAR) 5 MG tablet Take 5 mg by mouth daily.     Yes Historical Provider, MD  furosemide (LASIX) 20 MG tablet Take 20 mg by mouth every other day.     Yes Historical Provider, MD  glucosamine-chondroitin 500-400 MG tablet Take 1 tablet by mouth 2 (two) times daily.   Yes Historical Provider, MD  isosorbide mononitrate (IMDUR) 60 MG 24 hr tablet Take 60 mg by mouth daily.   Yes Historical Provider, MD  metoprolol (TOPROL-XL) 50 MG 24 hr tablet Take 50 mg by mouth daily.    Yes Historical Provider, MD  nitroGLYCERIN (NITROSTAT) 0.4 MG SL tablet Place 0.4 mg under the tongue every 5 (five) minutes as needed. For chest pain   Yes Historical Provider, MD  potassium chloride SA (K-DUR,KLOR-CON) 20 MEQ tablet Take 20 mEq by mouth every Monday, Wednesday, and Friday.    Yes Historical Provider, MD  ranitidine  (ZANTAC) 150 MG capsule Take 150 mg by mouth daily.   Yes Historical Provider, MD  rosuvastatin (CRESTOR) 40 MG tablet Take 40 mg by mouth daily.   Yes Historical Provider, MD  sertraline (ZOLOFT) 100 MG tablet Take 150 mg by mouth daily.    Yes Historical Provider, MD  temazepam (RESTORIL) 15 MG capsule Take 15 mg by mouth at bedtime as needed. For sleep 06/22/12  Yes Cassell Clement, MD   Allergies No Known Allergies  Family History Family History  Problem Relation Age of Onset  . Heart attack Mother   . Addison's disease Father   . Heart attack Sister   . Anesthesia problems Neg Hx   . Hypotension Neg Hx   . Malignant hyperthermia Neg Hx   . Pseudochol deficiency Neg Hx    Social History  reports that he has never smoked. He does not have any smokeless tobacco history on file. He reports that he does not drink alcohol or use illicit drugs.  Review Of Systems  11 points review of systems is negative with an exception of listed in HPI.  Vital Signs: BP 149/59  Pulse 79  Temp 98.4 F (36.9 C) (Oral)  Resp 18  Ht 5' 10.08" (1.78 m)  Wt 173 lb 8 oz (78.7 kg)  BMI 24.84 kg/m2  SpO2 96%       . [EXPIRED] amiodarone (NEXTERONE PREMIX) 360 mg/200 mL dextrose 30 mg/hr (07/16/12 0717)  . [DISCONTINUED] sodium chloride 125 mL/hr at 07/16/12 0000     Intake/Output Summary (Last 24 hours) at 07/17/12 0950 Last data filed at 07/17/12 0600  Gross per 24 hour  Intake 1101.7 ml  Output    985 ml  Net  116.7 ml   Physical Examination: Vitals reviewed. General: resting in bed, speaking in shortened sentences secondary to respiratory distress HEENT: PERRL, EOMI, no scleral icterus Cardiac: Midline sternotomy scar noted.  RRR, 3/6 systolic ejection murmur best heard in the LLSB. no rubs, or gallops Pulm: clear to auscultation bilaterally, no wheezes, rales, or rhonchi Abd: soft, nontender, nondistended, BS present Ext: warm and well perfused, no pedal edema,  Neuro: alert and  oriented X3, cranial nerves II-XII grossly intact, strength and sensation to light touch equal in bilateral upper and lower extremities  Labs and Imaging:   Lab 07/17/12 0520 07/16/12 0605 07/15/12 1854  NA 136 137 136  K 4.0 4.3 3.9  CL 103 103 103  CO2 26 24 19   BUN 21 19 16   CREATININE 1.09 1.26 1.04  GLUCOSE 115* 117* 144*    Lab 07/17/12 0520 07/16/12 0605 07/15/12 1854  HGB 10.9* 11.4* 11.5*  HCT 32.4* 34.7* 33.9*  WBC 7.4 13.8* 13.0*  PLT 175 168 162   Assessment and Plan: 1. Increased SOB:   We were asked to evaluate Mr. Smellie for increased shortness of breath with concern for COPD.  He has no current indication for intubation or other respiratory support.  He states he has not had a cough, though he does have increased shortness of breath on exertion and currently at rest.  He has had a fever but there is no definite infiltrate or indication of edema on his chest x-ray.  Differential diagnosis for his increased SOB include COPD exacerbation vs acute heart failure secondary to his MI.  He had an EF of about 50% on the Cath which is less then his previous echo which was from 2010.  His history of heart failure is diastolic heart failure.  He has not had formal PFTs but states that he has used Atrovent for sometime.  On my exam there were no wheezes but prior notes indicate wheezes which seem to be well controlled with his nebulizer treatments.  He has no indication for systemic corticosteroid treatment at this time for a COPD exacerbation.    Recommendations:  1. Continue inhaled corticosteroid treatment as well as Spiriva.   2. Outpatient  PFTs when more stable 3. Agree with need for cardiopulmonary rehabilitation. 4. Continue to monitor for possible need for home oxygen. He states that he has had to have home oxygen previously following a CHF exacerbation.  PRIBULA,CHRISTOPHER 07/17/2012, 9:50 AM  Patient seen and examined, agree with above note, no indication for steroids  at this time, continue bronchodilators and supplemental O2.  Zosyn per primary service.  Will f/u.  Patient seen and examined, agree with above note.  I dictated the care and orders written for this patient under my direction.  Koren Bound, M.D. [phone]

## 2012-07-18 DIAGNOSIS — I2109 ST elevation (STEMI) myocardial infarction involving other coronary artery of anterior wall: Secondary | ICD-10-CM

## 2012-07-18 DIAGNOSIS — I517 Cardiomegaly: Secondary | ICD-10-CM

## 2012-07-18 DIAGNOSIS — I10 Essential (primary) hypertension: Secondary | ICD-10-CM

## 2012-07-18 DIAGNOSIS — N419 Inflammatory disease of prostate, unspecified: Secondary | ICD-10-CM

## 2012-07-18 LAB — PRO B NATRIURETIC PEPTIDE: Pro B Natriuretic peptide (BNP): 5879 pg/mL — ABNORMAL HIGH (ref 0–450)

## 2012-07-18 LAB — COMPREHENSIVE METABOLIC PANEL
Alkaline Phosphatase: 60 U/L (ref 39–117)
BUN: 18 mg/dL (ref 6–23)
CO2: 25 mEq/L (ref 19–32)
Chloride: 103 mEq/L (ref 96–112)
GFR calc Af Amer: 86 mL/min — ABNORMAL LOW (ref >90)
GFR calc non Af Amer: 74 mL/min — ABNORMAL LOW (ref >90)
Glucose, Bld: 100 mg/dL — ABNORMAL HIGH (ref 70–99)
Potassium: 3.9 mEq/L (ref 3.5–5.1)
Total Bilirubin: 0.2 mg/dL — ABNORMAL LOW (ref 0.3–1.2)

## 2012-07-18 LAB — URINE CULTURE: Colony Count: >=100000

## 2012-07-18 LAB — CULTURE, BLOOD (ROUTINE X 2)

## 2012-07-18 LAB — CBC
HCT: 32.1 % — ABNORMAL LOW (ref 39.0–52.0)
Hemoglobin: 11.1 g/dL — ABNORMAL LOW (ref 13.0–17.0)
MCV: 97.6 fL (ref 78.0–100.0)
WBC: 5.6 10*3/uL (ref 4.0–10.5)

## 2012-07-18 MED ORDER — FUROSEMIDE 10 MG/ML IJ SOLN
40.0000 mg | Freq: Every day | INTRAMUSCULAR | Status: DC
  Filled 2012-07-18 (×2): qty 4

## 2012-07-18 MED ORDER — FUROSEMIDE 10 MG/ML IJ SOLN
40.0000 mg | Freq: Three times a day (TID) | INTRAMUSCULAR | Status: AC
  Administered 2012-07-18: 40 mg via INTRAVENOUS

## 2012-07-18 MED ORDER — FUROSEMIDE 10 MG/ML IJ SOLN
40.0000 mg | Freq: Once | INTRAMUSCULAR | Status: AC
  Administered 2012-07-18: 40 mg via INTRAVENOUS
  Filled 2012-07-18: qty 4

## 2012-07-18 MED ORDER — IPRATROPIUM BROMIDE 0.02 % IN SOLN
0.5000 mg | RESPIRATORY_TRACT | Status: DC
  Administered 2012-07-18 – 2012-07-21 (×18): 0.5 mg via RESPIRATORY_TRACT
  Filled 2012-07-18 (×18): qty 2.5

## 2012-07-18 MED ORDER — CEFTRIAXONE SODIUM 2 G IJ SOLR
2.0000 g | Freq: Every day | INTRAMUSCULAR | Status: DC
  Administered 2012-07-18 – 2012-07-19 (×2): 2 g via INTRAVENOUS
  Filled 2012-07-18 (×3): qty 2

## 2012-07-18 MED ORDER — PREDNISONE 50 MG PO TABS
60.0000 mg | ORAL_TABLET | Freq: Every day | ORAL | Status: DC
  Administered 2012-07-18 – 2012-07-20 (×3): 60 mg via ORAL
  Filled 2012-07-18 (×4): qty 1

## 2012-07-18 MED ORDER — POTASSIUM CHLORIDE CRYS ER 20 MEQ PO TBCR
40.0000 meq | EXTENDED_RELEASE_TABLET | Freq: Once | ORAL | Status: AC
  Administered 2012-07-18: 40 meq via ORAL

## 2012-07-18 NOTE — Progress Notes (Signed)
  Echocardiogram 2D Echocardiogram has been performed.  [name] 07/18/2012, 2:57 PM

## 2012-07-18 NOTE — Progress Notes (Signed)
CARDIAC REHAB PHASE I   PRE:  Rate/Rhythm: 63 SR    BP: sitting 103/50    SaO2: 91-92 RA, 94 3L  MODE:  Ambulation: 190 ft   POST:  Rate/Rhythm: 77 SR    BP: sitting 111/56     SaO2: 100 4L  Pt did surprisingly well. Used rollator, 4L O2, assist x2. Steady with rollator. Breathing well. No c/o except right calf soreness. Return to recliner, SaO2 excellent on 4L. Will decrease O2 next walk.  [id number]  Harriet Masson CES, ACSM

## 2012-07-18 NOTE — Progress Notes (Signed)
Patient: [name] Date of Encounter: 07/18/2012, 6:44 AM Admit date: 07/15/2012     Subjective  Afebrile x 24 hours. States SOB is better. Denies cough, chest pain or chest pressure. He has good O2 Sat at rest. However, he desat to 70% with minimum activity on 2 L O2.   Objective  Physical Exam: Vitals: BP 133/59  Pulse 78  Temp 98.8 F (37.1 C) (Oral)  Resp 19  Ht 5' 10.08" (1.78 m)  Wt 173 lb 8 oz (78.7 kg)  BMI 24.84 kg/m2  SpO2 92% General: Pleasant, NAD, frail Psych: Normal affect.  Neuro: Alert and oriented X 3. Moves all extremities spontaneously.  HEENT: Normal, teeth are all intact  Neck: Supple without bruits or JVD.  Lungs: Bibasialr lungs sound diminished with rales noted. Scattered exp. wheezing noted.  Heart: RRR no s3, s4, or murmurs.  Abdomen: Soft, non-tender, non-distended, BS + x 4.  Extremities: No clubbing, cyanosis or edema. DP/PT/Radials 2+ except 1+ dorsalis pedis the right lower extremity.   Intake/Output:  Intake/Output Summary (Last 24 hours) at 07/18/12 0644 Last data filed at 07/18/12 0300  Gross per 24 hour  Intake   1100 ml  Output    600 ml  Net    500 ml    Inpatient Medications:     . amiodarone  300 mg Intravenous Once  . antiseptic oral rinse  15 mL Mouth Rinse BID  . aspirin  81 mg Oral Daily  . atorvastatin  80 mg Oral q1800  . calcium-vitamin D  1 tablet Oral BID  . carbamazepine  300 mg Oral BID  . clopidogrel  75 mg Oral QAC breakfast  . famotidine  20 mg Oral Daily  . finasteride  5 mg Oral Daily  . isosorbide mononitrate  60 mg Oral Daily  . levalbuterol  0.63 mg Nebulization Q4H  . metoprolol succinate  50 mg Oral Daily  . mometasone-formoterol  2 puff Inhalation BID  . piperacillin-tazobactam (ZOSYN)  IV  3.375 g Intravenous Q8H  . potassium chloride SA  20 mEq Oral Q M,W,F  . sertraline  150 mg Oral Daily  . tiotropium  18 mcg Inhalation Daily  . [DISCONTINUED] ciprofloxacin  400 mg Intravenous Q12H   . [DISCONTINUED] piperacillin-tazobactam (ZOSYN)  IV  3.375 g Intravenous Q6H      Labs:  [facility] 07/17/12 0520 07/16/12 0605  NA 136 137  K 4.0 4.3  CL 103 103  CO2 26 24  GLUCOSE 115* 117*  BUN 21 19  CREATININE 1.09 1.26  CALCIUM 8.7 8.4  MG -- --  PHOS -- --    Basename 07/17/12 0520 07/15/12 1854  AST 60* 49*  ALT 35 30  ALKPHOS 56 64  BILITOT 0.2* 0.4  PROT 6.0 6.0  ALBUMIN 2.5* 2.7*    Basename 07/17/12 0520 07/16/12 0605 07/15/12 1854 07/15/12 0804  WBC 7.4 13.8* -- --  NEUTROABS -- -- 12.5* 8.1*  HGB 10.9* 11.4* -- --  HCT 32.4* 34.7* -- --  MCV 97.3 98.6 -- --  PLT 175 168 -- --    Basename 07/16/12 0605 07/15/12 2350 07/15/12 1840 07/15/12 0805  CKTOTAL 407* -- 259* --  CKMB 4.5* -- 3.9 --  TROPONINI 1.94* 1.97* 1.22* <0.30    Basename 07/16/12 1028  HGBA1C 5.8*    Basename 07/16/12 1028  TSH 2.667  T4TOTAL --  T3FREE --  THYROIDAB --   Radiology/Studies: Dg Chest 2 View  07/15/2012  *RADIOLOGY  REPORT*  Clinical Data: Weakness.  Fall.  Head laceration.  Chest pain and shortness of breath.  CHEST - 2 VIEW  Comparison: 08/09/2011.  Findings: Chronic bronchitic changes are present at the lung bases. CABG/median sternotomy.  Cardiopericardial silhouette is upper limits of normal for projection and appears similar to the prior exam. Monitoring leads are projected over the chest.  There is no airspace disease.  No pleural effusion.  Aortic atherosclerosis. Small high area of pulmonary parenchymal scarring lateral to the left hilum appears similar to the prior exams.  No displaced rib fractures. No pneumothorax.  IMPRESSION: Chronic changes of the chest without acute cardiopulmonary disease.   Original Report Authenticated By: Andreas Newport, M.D.    Ct Head Wo Contrast  07/15/2012  *RADIOLOGY REPORT*  Clinical Data: The patient fell hitting head.  Positive loss of consciousness with a frontal scalp laceration.  CT HEAD WITHOUT CONTRAST,CT CERVICAL  SPINE WITHOUT CONTRAST  Technique:  Contiguous axial images were obtained from the base of the skull through the vertex without contrast.,Technique: Multidetector CT imaging of the cervical spine was performed. Multiplanar CT image reconstructions were also generated.  Comparison: None.  Findings: Head CT:  The ventricles are normal in configuration. There is ventricular and sulcal enlargement reflecting moderate age related atrophy.  No parenchymal masses or mass effect.  Mild patchy white matter hypoattenuation is noted most consistent with chronic microvascular ischemic change.  There is no evidence of a recent infarct.  No intracranial hemorrhage.  No extra-axial masses or abnormal fluid collections.  No skull fracture.  The visualized sinuses and mastoid air cells are clear.  There is a fracture of the base of the left nasal bone.  This could be chronic or acute.  Cervical spine CT:  There is no fracture or spondylolisthesis. There is some resorption at the tip of the odontoid process that appears chronic.  There is loss of disc height with endplate osteophytes throughout most of the cervical spine with endplate spurring and facet spurring leading to varying degrees of neural foraminal narrowing.  Neural foraminal narrowing is greatest bilaterally at C5-C6, moderate to severe bilaterally.  The bones are demineralized.  The surrounding soft tissues are unremarkable.  The lung apices are clear.  IMPRESSION: Head CT:  No acute intracranial abnormality.  Fracture at the base of the left nasal bone may be acute or chronic.  Cervical spine CT:  No fracture or acute finding   Original Report Authenticated By: Amie Portland, M.D.    Ct Cervical Spine Wo Contrast  07/15/2012  *RADIOLOGY REPORT*  Clinical Data: The patient fell hitting head.  Positive loss of consciousness with a frontal scalp laceration.  CT HEAD WITHOUT CONTRAST,CT CERVICAL SPINE WITHOUT CONTRAST  Technique:  Contiguous axial images were obtained  from the base of the skull through the vertex without contrast.,Technique: Multidetector CT imaging of the cervical spine was performed. Multiplanar CT image reconstructions were also generated.  Comparison: None.  Findings: Head CT:  The ventricles are normal in configuration. There is ventricular and sulcal enlargement reflecting moderate age related atrophy.  No parenchymal masses or mass effect.  Mild patchy white matter hypoattenuation is noted most consistent with chronic microvascular ischemic change.  There is no evidence of a recent infarct.  No intracranial hemorrhage.  No extra-axial masses or abnormal fluid collections.  No skull fracture.  The visualized sinuses and mastoid air cells are clear.  There is a fracture of the base of the left nasal bone.  This could  be chronic or acute.  Cervical spine CT:  There is no fracture or spondylolisthesis. There is some resorption at the tip of the odontoid process that appears chronic.  There is loss of disc height with endplate osteophytes throughout most of the cervical spine with endplate spurring and facet spurring leading to varying degrees of neural foraminal narrowing.  Neural foraminal narrowing is greatest bilaterally at C5-C6, moderate to severe bilaterally.  The bones are demineralized.  The surrounding soft tissues are unremarkable.  The lung apices are clear.  IMPRESSION: Head CT:  No acute intracranial abnormality.  Fracture at the base of the left nasal bone may be acute or chronic.  Cervical spine CT:  No fracture or acute finding   Original Report Authenticated By: Amie Portland, M.D.    Dg Chest Port 1 View  07/17/2012  *RADIOLOGY REPORT*  Clinical Data: 78 year old male with fever, wheezing, chronic obstructive pulmonary disease.  Myocardial infarction.  PORTABLE CHEST - 1 VIEW  Comparison: 07/16/2012 and earlier.  Findings: Portable semi upright AP view 0715 hours.  Resuscitation pad remain in place.  Stable sequelae of CABG.  Stable  cardiac size and mediastinal contours.  No pneumothorax or pulmonary edema. Small right pleural effusion.  No consolidation.  Stable ventilation.  IMPRESSION: Small right pleural effusion.  No other acute cardiopulmonary abnormality.   Original Report Authenticated By: Erskine Speed, M.D.    Dg Chest Port 1 View  07/16/2012  *RADIOLOGY REPORT*  Clinical Data: Shortness of breath.  PORTABLE CHEST - 1 VIEW  Comparison: Chest x-ray 07/15/2012.  Findings: Lung volumes are low.  No definite consolidative airspace disease.  No definite pleural effusions.  No evidence of pulmonary edema.  Heart size is borderline enlarged (likely accentuated by low lung volumes).  Mediastinal contours are unremarkable. Atherosclerosis in the thoracic aorta.  Status post median sternotomy for CABG.  Transcutaneous defibrillator pads are seen projecting over the left hemithorax.  IMPRESSION: 1.  Low lung volumes without radiographic evidence of acute cardiopulmonary disease. 2.  Atherosclerosis. 3.  Postoperative changes and support apparatus, as above.   Original Report Authenticated By: Trudie Reed, M.D.    Dg Chest Portable 1 View  07/15/2012  *RADIOLOGY REPORT*  Clinical Data: Fall.  Head injury  PORTABLE CHEST - 1 VIEW  Comparison: .  07/15/2012.  Findings: Rotation.  Tortuous aorta.  Cardiomegaly.  Central pulmonary vascular prominence.  No segmental consolidation, pulmonary edema or gross pneumothorax.  IMPRESSION:  Tortuous aorta.  Cardiomegaly.  Central pulmonary vascular prominence.  No segmental consolidation, pulmonary edema or gross pneumothorax.   Original Report Authenticated By: Lacy Duverney, M.D.    Telemetry: SB   Assessment and Plan  78 year old white male with PMH of CAD/MI/CABG x 2 /CHF with EF 60-65% in 2010, HTN, HLD, COPD and prostate cancer presents with a fall, subsequent chest pain, and sustained V. tach and lost pulse requiring DC cardioversion 120 J by transcutaneous pads. He is baseline  interventricular conduction delay but ST segments elevated anterio- laterally on Leads I, AVL, V1-3.   Cath on 07/15/12-  LIMA to LAD is widely patent. BMS to 90% mid LADLVEDP 11 mmHg. Ejection fraction 50 %  1. 11. Acute STEMI, anterial lateral wall. Mildly elevated troponin 1.94< 1.97< 1.22)   - ASA, Plavix, Statin, Betablocker, Nitrates ( Isosorbide)  - risk stratification--LDL goal < 70, BP < 140/90, < 130/80 if DM or CKD, TSH, HBA1C, exercise and weight control.  - off amiodarone drip. No ectopies.   2.  SIRS and Bacteremia, E-coli  Bld Cx 2/2 GNR. Afebrile x 24 hours, WBC trending down. No acidosis noted.  - IV Zosyn. Pending sensitivity to further narrow.   3. Prostitis/UTI--Zosyn   4. Leukocytosis, resolved, likely associated with bacteremia, de margination and AMI,   5. Head laceration 2/2 fall, sutured and stapled at ED on 07/15/12  Will need to remove sutures and Staple in ~ 7-10 days.   6. Elevated transaminase, likely associated with AMI. Will trend   7. COPD. Only on PRN Atrovent neb at home  Never had PFTs or pulmonary followup. He reports that his SOB is at his baseline and does not wear O2 at home.  - O2  - Bronchodilator -Xopenex and Atrovent PRN  - Dulera (Steroids/LABA) and Spiriva ( LA anticholinergic)  - will need evaluation of his home oxygen needs upon discharge ( rest and ambulating O2 Sat)  - Will need PFT as outpatient in near future  - He will benefit from cardiopulmonary rehab as outpatient.   8. HTN, on Beta blocker and Nitrates (Home meds)   9. HLD, LDL 106, Goal is < 70. On Lipitor   10. Prediabetes. HB A1C 5.8   11. Code status: Full   12. VTE: SCDs   13. Disposition- will consult triad hospitalist for management of medical issues. Can transfer to SDU today  Patient denies chest pain or chest pressure. His is stable at cardiac stand point. With regards to his SOB, most likely due to COPD exacerbation, and he is better with treatment of COPD.  No S/S of PNA. However, He is ~ 5-10 lbs over his baseline with total net positive ~ 2500 since admission. Recs Lasix today and echo to evaluate his diastolic function.   His fever and bacteremia are likely related to his prostatitis/UTI. On Zosyn pending sensitivity report.  Signed, LI, NA PGY-2 6:45 AM   History and all data above reviewed.  Patient examined.  I agree with the findings as above. He is weak and still gets SOB with mild activity.  Desats.  No chest painThe patient exam reveals COR:RRR, systolic murmur  ,  Lungs: Wheezing  ,  Abd: Positive bowel sounds, no rebound no guarding, Ext No edema  .  All available labs, radiology testing, previous records reviewed. Agree with documented assessment and plan. Post v fib arrest.  PCI of LAD.  Continue current cardiac therapy.  The limiting issues will be lungs and weakness.  CCM help is appreciated.  Consult hospitalist service for help with management of chronic issues and ID issues.  Changing to Rocephin for treatment of E  Coli.  Check echocardiogram and proBNP. Transfer to step down.    Rollene Rotunda  9:25 AM  07/18/2012

## 2012-07-18 NOTE — Progress Notes (Signed)
Pulmonary Critical Care Progress Note  Referring Physician: Dr. Elease Hashimoto, Cardiology  Subjective: SOB better today but still desaturates with minimal exertion.  No cough, chills.  Mild fever yesterday morning but afebrile since then.  UC grew out E. Coli that was pan-sensitive.    Objective: Vital signs in last 24 hours: Filed Vitals:   07/18/12 0700 07/18/12 0731 07/18/12 0800 07/18/12 0844  BP: 133/61  128/64   Pulse: 71  71   Temp:  98.7 F (37.1 C)    TempSrc:  Oral    Resp:      Height:      Weight: 175 lb 11.3 oz (79.7 kg)     SpO2: 91%  90% 97%   Weight change: 2 lb 3.3 oz (1 kg)  Intake/Output Summary (Last 24 hours) at 07/18/12 1103 Last data filed at 07/18/12 0800  Gross per 24 hour  Intake  912.5 ml  Output    775 ml  Net  137.5 ml   Lines and Drains: None  ABX:  Cipro: 11/16 --> 11/18 Zosyn: 11/18 --> 11/19 Rocephin: 11/19 -->  Cultures:  Urine 11/16: >100k E. Coli pan- sensitive BCx2 11/16: E. Coli pan-sensitive UC 11/17: >100k E. Coli pan-sensitive BCx2 11/17: NGTD  Tests / Events:  Cath 11/16:  1. Moderate disease in the left main coronary artery.  2. Occluded proximal left anterior descending artery. LIMA to LAD is widely patent. 90% mid LAD lesion was successfully stented with a 2.5 x 15 vision bare metal stent.  3. Moderate disease in the proximal left circumflex. Large OM 2 is occluded and fills retrograde, by left to left collaterals.  4. Occluded native proximal right coronary artery. Patent free RIMA to distal RCA. Moderate disease in the posterior lateral artery.  5. Overall normal left ventricular systolic function. Mild, mid anterior hypokinesis. LVEDP 11 mmHg. Ejection fraction 50 %.  Physical Exam:  Vitals reviewed. General: resting in bed, NAD, speaking in full sentences but still occasionally using accessory muscles to breath.  HEENT: PERRL, EOMI, no scleral icterus Cardiac: Midline sternotomy scar noted. RRR, 3/6 systolic ejection  murmur best heard in the LLSB. no rubs, or gallops Pulm: mild bibasilar crackles noted today.  Very mild end expiratory wheeze, no rhonchi Abd: soft, nontender, nondistended, BS present Ext: warm and well perfused, no pedal edema Neuro: alert and oriented X3, cranial nerves II-XII grossly intact, strength and sensation to light touch equal in bilateral upper and lower extremities  Lab Results: Basic Metabolic Panel:  Lab 07/18/12 1610 07/17/12 0520  NA 136 136  K 3.9 4.0  CL 103 103  CO2 25 26  GLUCOSE 100* 115*  BUN 18 21  CREATININE 0.93 1.09  CALCIUM 8.9 8.7  MG -- --  PHOS -- --   Liver Function Tests:  Lab 07/18/12 0841 07/17/12 0520  AST 78* 60*  ALT 50 35  ALKPHOS 60 56  BILITOT 0.2* 0.2*  PROT 6.2 6.0  ALBUMIN 2.4* 2.5*   CBC:  Lab 07/18/12 0841 07/17/12 0520 07/15/12 1854 07/15/12 0804  WBC 5.6 7.4 -- --  NEUTROABS -- -- 12.5* 8.1*  HGB 11.1* 10.9* -- --  HCT 32.1* 32.4* -- --  MCV 97.6 97.3 -- --  PLT 179 175 -- --   Cardiac Enzymes:  Lab 07/16/12 0605 07/15/12 2350 07/15/12 1840  CKTOTAL 407* -- 259*  CKMB 4.5* -- 3.9  CKMBINDEX -- -- --  TROPONINI 1.94* 1.97* 1.22*   BNP:  Lab 07/18/12 0921  PROBNP  5879.0*   Hemoglobin A1C:  Lab 07/16/12 1028  HGBA1C 5.8*   Thyroid Function Tests:  Lab 07/16/12 1028  TSH 2.667  T4TOTAL --  FREET4 --  T3FREE --  THYROIDAB --   Urinalysis:  Lab 07/15/12 1516  COLORURINE GREEN*  LABSPEC 1.017  PHURINE 6.0  GLUCOSEU NEGATIVE  HGBUR LARGE*  BILIRUBINUR NEGATIVE  KETONESUR NEGATIVE  PROTEINUR NEGATIVE  UROBILINOGEN 0.2  NITRITE POSITIVE*  LEUKOCYTESUR MODERATE*   Micro Results: Recent Results (from the past 240 hour(s))  URINE CULTURE     Status: Normal   Collection Time   07/15/12  3:16 PM      Component Value Range Status Comment   Specimen Description URINE, CLEAN CATCH   Final    Special Requests NONE   Final    Culture  Setup Time 07/15/2012 21:42   Final    Colony Count >=100,000  COLONIES/ML   Final    Culture ESCHERICHIA COLI   Final    Report Status 07/17/2012 FINAL   Final    Organism ID, Bacteria ESCHERICHIA COLI   Final   CULTURE, BLOOD (ROUTINE X 2)     Status: Normal   Collection Time   07/15/12  6:39 PM      Component Value Range Status Comment   Specimen Description BLOOD RIGHT HAND   Final    Special Requests BOTTLES DRAWN AEROBIC AND ANAEROBIC 10 CC   Final    Culture  Setup Time 07/16/2012 13:16   Final    Culture     Final    Value: ESCHERICHIA COLI     Note: Gram Stain Report Called to,Read Back By and Verified With: NOLAN KUFFOUL 07/16/12 @ 8:30PM BY RUSCA.   Report Status 07/18/2012 FINAL   Final    Organism ID, Bacteria ESCHERICHIA COLI   Final   CULTURE, BLOOD (ROUTINE X 2)     Status: Normal   Collection Time   07/15/12  6:44 PM      Component Value Range Status Comment   Specimen Description BLOOD RIGHT ARM   Final    Special Requests BOTTLES DRAWN AEROBIC AND ANAEROBIC 10CC   Final    Culture  Setup Time 07/16/2012 13:16   Final    Culture     Final    Value: ESCHERICHIA COLI     Note: SUSCEPTIBILITIES PERFORMED ON PREVIOUS CULTURE WITHIN THE LAST 5 DAYS.     Note: Gram Stain Report Called to,Read Back By and Verified With: NOLAN KUFFOUL 07/16/12 @ 8:30PM BY RUSCA.   Report Status 07/18/2012 FINAL   Final   URINE CULTURE     Status: Normal   Collection Time   07/15/12  7:57 PM      Component Value Range Status Comment   Specimen Description URINE, CATHETERIZED   Final    Special Requests NONE   Final    Culture  Setup Time 07/16/2012 13:26   Final    Colony Count >=100,000 COLONIES/ML   Final    Culture ESCHERICHIA COLI   Final    Report Status 07/18/2012 FINAL   Final    Organism ID, Bacteria ESCHERICHIA COLI   Final   MRSA PCR SCREENING     Status: Normal   Collection Time   07/16/12  9:57 AM      Component Value Range Status Comment   MRSA by PCR NEGATIVE  NEGATIVE Final   CULTURE, BLOOD (ROUTINE X 2)     Status: Normal  (Preliminary  result)   Collection Time   07/16/12  5:06 PM      Component Value Range Status Comment   Specimen Description BLOOD RIGHT ARM   Final    Special Requests BOTTLES DRAWN AEROBIC AND ANAEROBIC 10CC   Final    Culture  Setup Time 07/16/2012 23:40   Final    Culture     Final    Value:        BLOOD CULTURE RECEIVED NO GROWTH TO DATE CULTURE WILL BE HELD FOR 5 DAYS BEFORE ISSUING A FINAL NEGATIVE REPORT   Report Status PENDING   Incomplete   CULTURE, BLOOD (ROUTINE X 2)     Status: Normal (Preliminary result)   Collection Time   07/16/12  5:15 PM      Component Value Range Status Comment   Specimen Description BLOOD RIGHT HAND   Final    Special Requests BOTTLES DRAWN AEROBIC ONLY 3CC   Final    Culture  Setup Time 07/16/2012 23:40   Final    Culture     Final    Value:        BLOOD CULTURE RECEIVED NO GROWTH TO DATE CULTURE WILL BE HELD FOR 5 DAYS BEFORE ISSUING A FINAL NEGATIVE REPORT   Report Status PENDING   Incomplete    Studies/Results: Dg Chest Port 1 View  07/17/2012  *RADIOLOGY REPORT*  Clinical Data: 78 year old male with fever, wheezing, chronic obstructive pulmonary disease.  Myocardial infarction.  PORTABLE CHEST - 1 VIEW  Comparison: 07/16/2012 and earlier.  Findings: Portable semi upright AP view 0715 hours.  Resuscitation pad remain in place.  Stable sequelae of CABG.  Stable cardiac size and mediastinal contours.  No pneumothorax or pulmonary edema. Small right pleural effusion.  No consolidation.  Stable ventilation.  IMPRESSION: Small right pleural effusion.  No other acute cardiopulmonary abnormality.   Original Report Authenticated By: Erskine Speed, M.D.    Medications: I have reviewed the patient's current medications. Scheduled Meds:   . amiodarone  300 mg Intravenous Once  . antiseptic oral rinse  15 mL Mouth Rinse BID  . aspirin  81 mg Oral Daily  . atorvastatin  80 mg Oral q1800  . calcium-vitamin D  1 tablet Oral BID  . carbamazepine  300 mg Oral  BID  . cefTRIAXone (ROCEPHIN)  IV  2 g Intravenous Daily  . clopidogrel  75 mg Oral QAC breakfast  . famotidine  20 mg Oral Daily  . finasteride  5 mg Oral Daily  . furosemide  40 mg Intravenous Once  . ipratropium  0.5 mg Nebulization Q4H  . isosorbide mononitrate  60 mg Oral Daily  . levalbuterol  0.63 mg Nebulization Q4H  . metoprolol succinate  50 mg Oral Daily  . mometasone-formoterol  2 puff Inhalation BID  . potassium chloride SA  20 mEq Oral Q M,W,F  . potassium chloride  40 mEq Oral Once  . predniSONE  60 mg Oral Q breakfast  . sertraline  150 mg Oral Daily  . [DISCONTINUED] piperacillin-tazobactam (ZOSYN)  IV  3.375 g Intravenous Q8H  . [DISCONTINUED] tiotropium  18 mcg Inhalation Daily   Continuous Infusions:  PRN Meds:.acetaminophen, ALPRAZolam, nitroGLYCERIN, ondansetron (ZOFRAN) IV, temazepam Assessment/Plan: 1. Increased SOB: Appears more comfortable today.  Still having problems with desaturations with minimal exertion.  Still no cough.  Likely cause of his increased SOB is an acute CHF exacerbation caused by his recent MI as well as exacerbation of his underlying COPD.  He  is improving on his current therapy.   Recommendations:  1. Continue inhaled corticosteroid treatment as well as Spiriva.  2. Outpatient PFTs when more stable  3. Agree with need for cardiopulmonary rehabilitation.  4. Continue to monitor for possible need for home oxygen. He states that he has had to have home oxygen previously following a CHF exacerbation.  Thank you for this consult.  PCCM will sign off today.    LOS: 3 days   PRIBULA,CHRISTOPHER 07/18/2012, 11:03 AM  PCCM signing off, please call back if needed.  Patient seen and examined, agree with above note.  I dictated the care and orders written for this patient under my direction.  Koren Bound, M.D. [phone]

## 2012-07-18 NOTE — Consult Note (Signed)
[facility] Medical Consultation  [name] ZOX:[id number] DOB: 12-23-1924 DOA: 07/15/2012 PCP: Evette Georges, MD   Requesting physician: Dr. Rollene Rotunda  Date of consultation: 07/18/2012.  Reason for consultation: Evaluation and management of bacteremia and other medical conditions.  Primary Urologist: Dr. Mena Goes.  Impression/Recommendations Principal Problem:  *Acute MI, anterolateral wall Active Problems:  HYPERLIPIDEMIA  HYPERTENSION  CHF (congestive heart failure)  COPD (chronic obstructive pulmonary disease)  Laceration of head  Prostatitis  UTI (lower urinary tract infection)  Leukocytosis  Elevated transaminase level  SIRS (systemic inflammatory response syndrome)  Bacteremia    1. Escherichia coli bacteremia/SIRS on admission, likely secondary to UTI/prostatitis: Urine cultures x1 and blood cultures x2 on 11/16 showed pansensitive Escherichia coli. Surveillance blood cultures x2 from 11/17 are negative to date. Was initially on IV Zosyn and Cipro and changed to Rocephin 2 g IV daily on 11/19. Patient had temperature of 102F on 11/18 at 9 AM and has been afebrile since. No leukocytosis. Patient does not look septic or toxic. If no further fevers, consider changing to oral antibiotics on 11/20. Discussed with ID MD on call: recommended total 2 weeks of oral Bactrim DS one twice a day.  2. Hypertension: Controlled. Continue beta blockers. 3. COPD: Patient still with mild dyspnea but better than this a.m., probably complicated by acute diastolic CHF. Diuresing per cardiology. Pulmonary recommendations appreciated-continue inhaled corticosteroids, Spiriva and evaluate need for home oxygen. Outpatient PFTs. 4. Anemia: Stable 5. Mild transaminitis: Unclear etiology. 6. Hyperlipidemia: Continue Lipitor. 7. Prediabetes: Hemoglobin A1c: 5.8. Diet and weight loss when able to 8. Acute STEMI, anterior lateral wall, status post PCI, NSVT on admission, acute  diastolic CHF: Management per primary/cardiology service.  The Hospitalist service will followup again tomorrow. Please contact us if we can be of assistance in the meanwhile. Thank you for this consultation.  Chief Complaint: Feels tired  HPI:  78 year old male patient with history of CAD, MI, status post CABG, chronic diastolic CHF with EF 60-65% in 2010, HTN, HL, COPD-not on home O2, chronic right foot drop, prior? Bacteremia, chronic back pain presented on 11/16 after a fall, subsequent chest pain and sustained V. tach requiring DC cardioversion. He was found to have acute STEMI-Cath on 07/15/12- LIMA to LAD is widely patent. BMS to 90% mid LADLVEDP 11 mmHg. Ejection fraction 50 %. Patient and family indicate that he had been having fevers and dysuria prior to admission, had been seen by urology and was told to have prostatitis but had not yet been started on antibiotics. In the hospital, he was initially started on Cipro and then switched to Zosyn on 11/18 due to fever 102F. Patient currently indicates that he feels tired. His dyspnea is significantly better compared to this morning. He denies chest pain. He denies any further dysuria.    Review of Systems:   All systems reviewed and apart from history of presenting illness, are negative   Past Medical History  Diagnosis Date  . Ischemic heart disease   . Myocardial infarction   . CHF (congestive heart failure)   . Hypoxia     chronic  . Angina     when walking  . Dizziness     with low blood pressure  . Dyslipidemia (high LDL; low HDL)   . Hypertension   . Coronary artery disease   . Heart murmur   . Peripheral vascular disease     thrombus- in leg- many yrs. ago- ?R- coumadin x1 mth  . Pneumonia  hosp. long time ago   . Shortness of breath   . COPD (chronic obstructive pulmonary disease)     Dec. 2010- d/c O2(had been using for 2 mths)  . Blood transfusion     post CABG  . Hepatitis     hepatitis- long time ago,  occupational contamination   . GERD (gastroesophageal reflux disease)   . Cancer     prostate - Ca- radiation therapy- 2002  . Peripheral neuropathy   . Hiatal hernia   . Arthritis     hnp, lumbar, ankle (foot drop-R), wears a splint, arthritis - "all over", low bone density   . Depression     uses Zoloft    Past Surgical History  Procedure Date  . Coronary artery bypass graft 1986 and 1993  . Ptca 2008  . Cardiac catheterization     2010- last cath, two stents at that time  . Eye surgery     cataracts - bilateral , IOL  . Hernia repair     bilateral inguiinal repair   . Tonsillectomy   . Fracture surgery     heel- crushed -2002,  (hardware)   . Joint replacement     R knee- arthroscopic, x2, R great toe- 2 surgeries- /w hardware & thenlater fused by Dr. Lajoyce Corners    . Appendectomy   . Rhinoplasty     as a teen, followed by same procedure at 1970's   . Lumbar laminectomy/decompression microdiscectomy 08/10/2011    Procedure: LUMBAR LAMINECTOMY/DECOMPRESSION MICRODISCECTOMY;  Surgeon: Kathaleen Maser Pool;  Location: [facility];  Service: Neurosurgery;  Laterality: Right;  RIGHT Lumbar five-sacral one LAMINECTOMY, MICRODISCECTOMY  . Lumbar wound debridement 08/22/2011    Procedure: LUMBAR WOUND DEBRIDEMENT;  Surgeon: Tia Alert;  Location: [facility];  Service: Neurosurgery;  Laterality: N/A;  Repair of CSF Leak requiring laminectomy   Social History:  reports that he has never smoked. He does not have any smokeless tobacco history on file. He reports that he does not drink alcohol or use illicit drugs.  Patient lives with his wife, grandson and ambulates with the help of a walker.  No Known Allergies Family History  Problem Relation Age of Onset  . Heart attack Mother   . Addison's disease Father   . Heart attack Sister   . Anesthesia problems Neg Hx   . Hypotension Neg Hx   . Malignant hyperthermia Neg Hx   . Pseudochol deficiency Neg Hx     Prior to Admission medications    Medication Sig Start Date End Date Taking? Authorizing Provider  albuterol (PROVENTIL HFA;VENTOLIN HFA) 108 (90 BASE) MCG/ACT inhaler Inhale 2 puffs into the lungs every 6 (six) hours as needed. For shortness of breath.    Yes Historical Provider, MD  calcium-vitamin D (OSCAL WITH D 500-200) 500-200 MG-UNIT per tablet Take 1 tablet by mouth 2 (two) times daily.     Yes Historical Provider, MD  carbamazepine (CARBATROL) 300 MG 12 hr capsule Take 300 mg by mouth 2 (two) times daily.     Yes Historical Provider, MD  clopidogrel (PLAVIX) 75 MG tablet Take 75 mg by mouth daily.     Yes Historical Provider, MD  finasteride (PROSCAR) 5 MG tablet Take 5 mg by mouth daily.     Yes Historical Provider, MD  furosemide (LASIX) 20 MG tablet Take 20 mg by mouth every other day.     Yes Historical Provider, MD  glucosamine-chondroitin 500-400 MG tablet Take 1 tablet by  mouth 2 (two) times daily.   Yes Historical Provider, MD  isosorbide mononitrate (IMDUR) 60 MG 24 hr tablet Take 60 mg by mouth daily.   Yes Historical Provider, MD  metoprolol (TOPROL-XL) 50 MG 24 hr tablet Take 50 mg by mouth daily.    Yes Historical Provider, MD  nitroGLYCERIN (NITROSTAT) 0.4 MG SL tablet Place 0.4 mg under the tongue every 5 (five) minutes as needed. For chest pain   Yes Historical Provider, MD  potassium chloride SA (K-DUR,KLOR-CON) 20 MEQ tablet Take 20 mEq by mouth every Monday, Wednesday, and Friday.    Yes Historical Provider, MD  ranitidine (ZANTAC) 150 MG capsule Take 150 mg by mouth daily.   Yes Historical Provider, MD  rosuvastatin (CRESTOR) 40 MG tablet Take 40 mg by mouth daily.   Yes Historical Provider, MD  sertraline (ZOLOFT) 100 MG tablet Take 150 mg by mouth daily.    Yes Historical Provider, MD  temazepam (RESTORIL) 15 MG capsule Take 15 mg by mouth at bedtime as needed. For sleep 06/22/12  Yes Cassell Clement, MD   Physical Exam: Blood pressure 116/60, pulse 86, temperature 98.4 F (36.9 C), temperature  source Oral, resp. rate 19, height 5' 10.08" (1.78 m), weight 79.7 kg (175 lb 11.3 oz), SpO2 94.00%. Filed Vitals:   07/18/12 1118 07/18/12 1200 07/18/12 1553 07/18/12 1633  BP: 114/48   116/60  Pulse:      Temp:  98 F (36.7 C)  98.4 F (36.9 C)  TempSrc:  Oral  Oral  Resp:      Height:      Weight:      SpO2:  93% 93% 94%     General exam: Moderately built and nourished male patient, lying comfortably supine in bed without any distress.  Head, eyes and ENT: Large upper parietal scalp laceration with staples and dried blood around-not actively bleeding. Pupils equally reacting to light and accommodation. Oral mucosa is moist.   Neck: Supple. No JVD, carotid bruit or thyromegaly  Lymphatics: no lymphadenopathy  Respiratory System: reduced breath sounds in bases with few basal crackles. Rest of lung files are clear to auscultation. No increased work of breathing.  Cardiovascular System: First and second heart sounds heard, regular rate and rhythm. No JVD, murmurs or pedal edema.  Gastrointestinal system: Abdomen is nondistended, soft and nontender. Normal bowel sounds heard.  Central nervous system: Alert and oriented. No focal neurological deficits.  Extremities: Symmetric 5 x 5 power. Right chronic foot drop. Peripheral pulses symmetrically felt.  Skin: No other acute findings.  Musculoskeletal system: Negative exam.  Psychiatric: Pleasant and cooperative.  Labs on Admission:  Basic Metabolic Panel:  Lab 07/18/12 9811 07/17/12 0520 07/16/12 0605 07/15/12 1854 07/15/12 0804  NA 136 136 137 136 135  K 3.9 4.0 4.3 3.9 4.0  CL 103 103 103 103 100  CO2 25 26 24 19 26   GLUCOSE 100* 115* 117* 144* 131*  BUN 18 21 19 16 14   CREATININE 0.93 1.09 1.26 1.04 1.06  CALCIUM 8.9 8.7 8.4 8.3* 9.3  MG 2.0 -- -- -- --  PHOS -- -- -- -- --   Liver Function Tests:  Lab 07/18/12 0841 07/17/12 0520 07/15/12 1854  AST 78* 60* 49*  ALT 50 35 30  ALKPHOS 60 56 64  BILITOT 0.2*  0.2* 0.4  PROT 6.2 6.0 6.0  ALBUMIN 2.4* 2.5* 2.7*   No results found for this basename: LIPASE:5,AMYLASE:5 in the last 168 hours No results found for this  basename: AMMONIA:5 in the last 168 hours CBC:  Lab 07/18/12 0841 07/17/12 0520 07/16/12 0605 07/15/12 1854 07/15/12 0804  WBC 5.6 7.4 13.8* 13.0* 9.6  NEUTROABS -- -- -- 12.5* 8.1*  HGB 11.1* 10.9* 11.4* 11.5* 12.7*  HCT 32.1* 32.4* 34.7* 33.9* 36.8*  MCV 97.6 97.3 98.6 97.4 97.4  PLT 179 175 168 162 198   Cardiac Enzymes:  Lab 07/16/12 0605 07/15/12 2350 07/15/12 1840 07/15/12 0805  CKTOTAL 407* -- 259* --  CKMB 4.5* -- 3.9 --  CKMBINDEX -- -- -- --  TROPONINI 1.94* 1.97* 1.22* <0.30   BNP: No components found with this basename: POCBNP:5 CBG: No results found for this basename: GLUCAP:5 in the last 168 hours  Radiological Exams on Admission: Dg Chest Port 1 View  07/17/2012  *RADIOLOGY REPORT*  Clinical Data: 78 year old male with fever, wheezing, chronic obstructive pulmonary disease.  Myocardial infarction.  PORTABLE CHEST - 1 VIEW  Comparison: 07/16/2012 and earlier.  Findings: Portable semi upright AP view 0715 hours.  Resuscitation pad remain in place.  Stable sequelae of CABG.  Stable cardiac size and mediastinal contours.  No pneumothorax or pulmonary edema. Small right pleural effusion.  No consolidation.  Stable ventilation.  IMPRESSION: Small right pleural effusion.  No other acute cardiopulmonary abnormality.   Original Report Authenticated By: Erskine Speed, M.D.       Time spent: 65 minutes.  [facility] Pager [phone]  If 7PM-7AM, please contact night-coverage [email or web address] Password [email or web address] 07/18/2012, 5:20 PM

## 2012-07-18 NOTE — Progress Notes (Signed)
Patient evaluated for long-term disease management services with [facility] Care Management Program as benefit of his [employer]. Spoke with patient and wife at bedside to explain [facility] Care Management services and that he will receive a post discharge transition of care call and will be evaluated for monthly home visits for assessments and for education. Explained how [facility] Care Management services do not interfere with any services that are arranged by inpatient case management or social work. Left [facility] Care Management packet and contact information at bedside. Mr Frazzini was appreciative of visit.  Raiford Noble, RN,BSN, MSN [facility] Liaison [phone]

## 2012-07-18 NOTE — Progress Notes (Signed)
#   fluid overload and ? Acute diastolic heart failure  SOB is most likely contributed by COPD exacerbation, which is better after threatment. However, given his weight gain and total positive Net and elevated Pro BNP, I suspect that he also develops acute diastolic heart failure ( EF 50% on cath).  - Will check his Mg level - will give him lasix 40 mg IV x 1 now along with K supplement - May increase diuretic therapy depending on UOP.

## 2012-07-19 DIAGNOSIS — R7302 Impaired glucose tolerance (oral): Secondary | ICD-10-CM | POA: Diagnosis present

## 2012-07-19 DIAGNOSIS — A498 Other bacterial infections of unspecified site: Secondary | ICD-10-CM

## 2012-07-19 DIAGNOSIS — D649 Anemia, unspecified: Secondary | ICD-10-CM | POA: Diagnosis present

## 2012-07-19 LAB — BASIC METABOLIC PANEL
BUN: 22 mg/dL (ref 6–23)
Chloride: 100 mEq/L (ref 96–112)
Creatinine, Ser: 0.94 mg/dL (ref 0.50–1.35)
GFR calc Af Amer: 86 mL/min — ABNORMAL LOW (ref >90)
Glucose, Bld: 104 mg/dL — ABNORMAL HIGH (ref 70–99)
Potassium: 3.7 mEq/L (ref 3.5–5.1)

## 2012-07-19 MED ORDER — DOCUSATE SODIUM 100 MG PO CAPS
200.0000 mg | ORAL_CAPSULE | Freq: Every day | ORAL | Status: DC
  Administered 2012-07-19 – 2012-07-21 (×3): 200 mg via ORAL
  Filled 2012-07-19 (×5): qty 2

## 2012-07-19 MED ORDER — FUROSEMIDE 40 MG PO TABS
40.0000 mg | ORAL_TABLET | Freq: Every day | ORAL | Status: DC
  Administered 2012-07-19: 40 mg via ORAL
  Filled 2012-07-19 (×2): qty 1

## 2012-07-19 NOTE — Progress Notes (Signed)
Patient: [name] / Admit Date: 07/15/2012 / Date of Encounter: 07/19/2012, 6:41 AM   Subjective  Feels well. No CP. SOB is better. He feels he is wheezing less.  Echo results yesterday: - Left ventricle: The cavity size was normal. Wall thickness was normal. Systolic function was mildly to moderately reduced. The estimated ejection fraction was in the range of 40% to 45%. - Left atrium: The atrium was moderately dilated.   Objective   Telemetry: NSR Physical Exam: Filed Vitals:   07/19/12 0430  BP: 126/60  Pulse: 65  Temp: 96.8 F (36 C)  Resp: 18   General: Well developed elderly WM in no acute distress. Head: Normocephalic, sclera non-icteric, no xanthomas, nares are without discharge. L semicircle scalp incision with staples, dried blood, no dehiscence or suppuration. Neck: JVD mildly elevated. Lungs: Faint crackles at bases. No wheezing or rhonchi. Breathing is unlabored. Heart: RRR S1 S2 without murmurs, rubs, or gallops.  Abdomen: Soft, non-tender, non-distended with normoactive bowel sounds. No hepatomegaly. No rebound/guarding. No obvious abdominal masses. Msk:  Strength and tone appear normal for age. Extremities: No clubbing or cyanosis. No edema.  Distal pedal pulses are 2+ and equal bilaterally. R groin with very minor bruising, no hematoma/suppuration/bruit/oozing Neuro: Alert and oriented X 3. Moves all extremities spontaneously. Psych:  Responds to questions appropriately with a normal affect.    Intake/Output Summary (Last 24 hours) at 07/19/12 0641 Last data filed at 07/19/12 0500  Gross per 24 hour  Intake    840 ml  Output   3460 ml  Net  -2620 ml    Inpatient Medications:    . antiseptic oral rinse  15 mL Mouth Rinse BID  . aspirin  81 mg Oral Daily  . atorvastatin  80 mg Oral q1800  . calcium-vitamin D  1 tablet Oral BID  . carbamazepine  300 mg Oral BID  . cefTRIAXone (ROCEPHIN)  IV  2 g Intravenous Daily  . clopidogrel  75 mg Oral QAC  breakfast  . famotidine  20 mg Oral Daily  . finasteride  5 mg Oral Daily  . [COMPLETED] furosemide  40 mg Intravenous Once  . furosemide  40 mg Intravenous Q8H  . furosemide  40 mg Intravenous Daily  . ipratropium  0.5 mg Nebulization Q4H  . isosorbide mononitrate  60 mg Oral Daily  . levalbuterol  0.63 mg Nebulization Q4H  . metoprolol succinate  50 mg Oral Daily  . mometasone-formoterol  2 puff Inhalation BID  . potassium chloride SA  20 mEq Oral Q M,W,F  . [COMPLETED] potassium chloride  40 mEq Oral Once  . predniSONE  60 mg Oral Q breakfast  . sertraline  150 mg Oral Daily  . [DISCONTINUED] amiodarone  300 mg Intravenous Once  . [DISCONTINUED] piperacillin-tazobactam (ZOSYN)  IV  3.375 g Intravenous Q8H  . [DISCONTINUED] tiotropium  18 mcg Inhalation Daily    Labs:  [facility] 07/19/12 0450 07/18/12 0841  NA 135 136  K 3.7 3.9  CL 100 103  CO2 27 25  GLUCOSE 104* 100*  BUN 22 18  CREATININE 0.94 0.93  CALCIUM 9.3 8.9  MG 2.2 2.0  PHOS -- --    Basename 07/18/12 0841 07/17/12 0520  AST 78* 60*  ALT 50 35  ALKPHOS 60 56  BILITOT 0.2* 0.2*  PROT 6.2 6.0  ALBUMIN 2.4* 2.5*    Basename 07/18/12 0841 07/17/12 0520  WBC 5.6 7.4  NEUTROABS -- --  HGB 11.1* 10.9*  HCT  32.1* 32.4*  MCV 97.6 97.3  PLT 179 175    Basename 07/16/12 1028  HGBA1C 5.8*    Radiology/Studies:  Echo as above  Ct Head Wo Contrast 07/15/2012  *RADIOLOGY REPORT*  Clinical Data: The patient fell hitting head.  Positive loss of consciousness with a frontal scalp laceration.  CT HEAD WITHOUT CONTRAST,CT CERVICAL SPINE WITHOUT CONTRAST  Technique:  Contiguous axial images were obtained from the base of the skull through the vertex without contrast.,Technique: Multidetector CT imaging of the cervical spine was performed. Multiplanar CT image reconstructions were also generated.  Comparison: None.  Findings: Head CT:  The ventricles are normal in configuration. There is ventricular and sulcal  enlargement reflecting moderate age related atrophy.  No parenchymal masses or mass effect.  Mild patchy white matter hypoattenuation is noted most consistent with chronic microvascular ischemic change.  There is no evidence of a recent infarct.  No intracranial hemorrhage.  No extra-axial masses or abnormal fluid collections.  No skull fracture.  The visualized sinuses and mastoid air cells are clear.  There is a fracture of the base of the left nasal bone.  This could be chronic or acute.  Cervical spine CT:  There is no fracture or spondylolisthesis. There is some resorption at the tip of the odontoid process that appears chronic.  There is loss of disc height with endplate osteophytes throughout most of the cervical spine with endplate spurring and facet spurring leading to varying degrees of neural foraminal narrowing.  Neural foraminal narrowing is greatest bilaterally at C5-C6, moderate to severe bilaterally.  The bones are demineralized.  The surrounding soft tissues are unremarkable.  The lung apices are clear.  IMPRESSION: Head CT:  No acute intracranial abnormality.  Fracture at the base of the left nasal bone may be acute or chronic.  Cervical spine CT:  No fracture or acute finding   Original Report Authenticated By: Amie Portland, M.D.    Ct Cervical Spine Wo Contrast 07/15/2012  *RADIOLOGY REPORT*  Clinical Data: The patient fell hitting head.  Positive loss of consciousness with a frontal scalp laceration.  CT HEAD WITHOUT CONTRAST,CT CERVICAL SPINE WITHOUT CONTRAST  Technique:  Contiguous axial images were obtained from the base of the skull through the vertex without contrast.,Technique: Multidetector CT imaging of the cervical spine was performed. Multiplanar CT image reconstructions were also generated.  Comparison: None.  Findings: Head CT:  The ventricles are normal in configuration. There is ventricular and sulcal enlargement reflecting moderate age related atrophy.  No parenchymal masses or  mass effect.  Mild patchy white matter hypoattenuation is noted most consistent with chronic microvascular ischemic change.  There is no evidence of a recent infarct.  No intracranial hemorrhage.  No extra-axial masses or abnormal fluid collections.  No skull fracture.  The visualized sinuses and mastoid air cells are clear.  There is a fracture of the base of the left nasal bone.  This could be chronic or acute.  Cervical spine CT:  There is no fracture or spondylolisthesis. There is some resorption at the tip of the odontoid process that appears chronic.  There is loss of disc height with endplate osteophytes throughout most of the cervical spine with endplate spurring and facet spurring leading to varying degrees of neural foraminal narrowing.  Neural foraminal narrowing is greatest bilaterally at C5-C6, moderate to severe bilaterally.  The bones are demineralized.  The surrounding soft tissues are unremarkable.  The lung apices are clear.  IMPRESSION: Head CT:  No acute  intracranial abnormality.  Fracture at the base of the left nasal bone may be acute or chronic.  Cervical spine CT:  No fracture or acute finding   Original Report Authenticated By: Amie Portland, M.D.    Dg Chest Port 1 View 07/17/2012  *RADIOLOGY REPORT*  Clinical Data: 78 year old male with fever, wheezing, chronic obstructive pulmonary disease.  Myocardial infarction.  PORTABLE CHEST - 1 VIEW  Comparison: 07/16/2012 and earlier.  Findings: Portable semi upright AP view 0715 hours.  Resuscitation pad remain in place.  Stable sequelae of CABG.  Stable cardiac size and mediastinal contours.  No pneumothorax or pulmonary edema. Small right pleural effusion.  No consolidation.  Stable ventilation.  IMPRESSION: Small right pleural effusion.  No other acute cardiopulmonary abnormality.   Original Report Authenticated By: Erskine Speed, M.D.     Assessment and Plan  1. Acute anterolateral STEMI s/p BMS to LAD 07/15/12 (hx of CABG) - continue ASA,  Lipitor, Plavix, Imdur, Toprol. 2. VT on admission s/p defib/amiodarone - quiescent, likely ischemic s/p revascularization. 3. Acute CHF ?systolic with EF 16-10% by echo 07/18/12 - 2 doses of Lasix yesterday - not yet weighed today, but downtrend noted by weights yesterday. Net -2620 yesterday. Continue IV Lasix through this morning - further diuretic mgmt per MD. 4. Escherichia coli bacteremia/SIRS on admission, likely secondary to UTI/prostatitis: appreciate IM assistance.  5. HTN - controlled. 6. Head laceration 2/2 fall s/p sutures/stables in ED 07/15/12 - will need removal of sutures 7-10 days (11/23-11/26). Will ask nursing to gently clean area today. 7. COPD - per PCCM, continue inhaled corticosteroid/Spiriva, plan outpatient PFTs, cardiopulm rehab, and follow for possible home oxygen needs (normal O2 sat at rest but reported desat with ambulation - patient has required home O2 in past after hospitalizations). 8. Anemia- stable. 9. Transaminitis - felt due to MI. 10. Hyperlipidemia - continue statin.  Signed, Ronie Spies PA-C  Agree with assessment and plan as noted above.  No respiratory distress this am.  Rhythm NSR .  Lungs no wheezing. Heart reveals grade 2/6 systolic murmur upper LSE and base. Extremities no edema.. Will change lasix to po today.  Continue cardiac rehab.

## 2012-07-19 NOTE — Progress Notes (Signed)
CARDIAC REHAB PHASE I   PRE:  Rate/Rhythm: 67 SR    BP: sitting 130/70    SaO2: 92 RA  MODE:  Ambulation: 240 ft   POST:  Rate/Rhythm: 77    BP: sitting 127/73     SaO2: 94 RA  Tolerated fairly well with assist x1, rollator. No O2 needed. No major c/o. Pt upset due to sick family member, quiet. To recliner. Will f/u. [id number]  [name] CES, ACSM

## 2012-07-19 NOTE — Consult Note (Signed)
[facility] Consult Note [place] TEAM 1 - Stepdown/ICU TEAM   FERN MARTHA UJW:[id number] DOB: [birth year] DOA: 07/15/2012 PCP: Evette Georges, MD  Brief narrative: 78 year old male patient with history of CAD, MI, status post CABG, chronic diastolic CHF with EF 60-65% in 2010, HTN, HL, COPD-not on home O2, chronic right foot drop, prior? Bacteremia, chronic back pain presented on 11/16 after a fall, subsequent chest pain and sustained V. tach requiring DC cardioversion. He was found to have acute STEMI-Cath on 07/15/12- LIMA to LAD is widely patent. BMS to 90% mid LAD. Ejection fraction by ventriculogram was 50 %. Patient and family indicated that he had been having fevers and dysuria prior to admission. He had been seen by urology and was diagnosed with prostatitis but had not yet been started on antibiotics. After admission to the hospital, he was initially started on Cipro and then switched to Zosyn on 11/18 due to fever 102F.    Assessment/Plan:  Acute MI, anterolateral wall/STEMI/status post bare-metal stent to LAD *Management per cardiology  Acute systolic congestive heart failure, NYHA class 2-EF 40-45% *Management per cardiology  Prostatitis/ E. coli UTI (urinary tract infection) *Continue Rocephin for now *Consulting internal medicine physician did discuss the case with infectious disease physician and recommendation is transition to oral Bactrim DS twice a day with a total of 2 weeks therapy to cover for prostatitis *Outpatient urologist is Dr. Mena Goes  SIRS (systemic inflammatory response syndrome) [place] due to Escherichia coli *Hemodynamically stable *Continue antibiotic coverage as above  HYPERLIPIDEMIA *Continue statin  HYPERTENSION *Continue metoprolol  Elevated transaminase level *Likely related to cardiac arrest time of admission and passive congestion secondary to heart failure   COPD (chronic obstructive pulmonary disease) *Continue  prednisone, dulera, and nebulizers  Laceration of head *Site unremarkable. Will need staple removal 7-10 days post initial laceration repair  Glucose intolerance (impaired glucose tolerance) *Hemoglobin A1c 5.8 - will need additional followup by primary care physician after discharge - follow CBG during hospital stay   Normocytic anemia *Hemoglobin stable and no evidence of active bleeding  Peripheral neuropathy *Continue Tegretol  DVT prophylaxis: SCDs Code Status: Full Family Communication: Spoke with patient and his wife Disposition Plan: At discretion of primary team. From an internal medicine standpoint he is appropriate to transfer to telemetry  Consultants: Internal medicine/[facility] Care medicine-signed off  Procedures: Left heart catheterization with selective coronary angiography and left ventriculogram with placement of bare-metal stent on 07/15/2012 by Dr. Eldridge Dace  Antibiotics: Zosyn 11/16 >>>11/19 Cipro 11/16 >>>11/19 Rocephin 11/19 >>>  HPI/Subjective: Patient alert and complaining of wheezing sensation when mouth breathing. Denies chest pain or shortness of breath.   Objective: Blood pressure 117/69, pulse 65, temperature 97.9 F (36.6 C), temperature source Oral, resp. rate 16, height 5' 10.08" (1.78 m), weight 77 kg (169 lb 12.1 oz), SpO2 92.00%.  Intake/Output Summary (Last 24 hours) at 07/19/12 1328 Last data filed at 07/19/12 1200  Gross per 24 hour  Intake    290 ml  Output   3060 ml  Net  -2770 ml     Exam: General: No acute respiratory distress Lungs: Clear to auscultation bilaterally without wheezes or crackles - upper airway wheezing, room air Cardiovascular: Regular rate and rhythm without murmur gallop or rub normal S1 and S2, sinus rhythm, trace peripheral edema of lower extremities Abdomen: Nontender, nondistended, soft, bowel sounds positive, no rebound, no ascites, no appreciable mass Musculoskeletal: No significant  cyanosis, clubbing of bilateral lower extremities Neurological: Patient is alert and  oriented x3, moves all extremities x4, exam is nonfocal  Data Reviewed: Basic Metabolic Panel:  Lab 07/19/12 4540 07/18/12 0841 07/17/12 0520 07/16/12 0605 07/15/12 1854  NA 135 136 136 137 136  K 3.7 3.9 4.0 4.3 3.9  CL 100 103 103 103 103  CO2 27 25 26 24 19   GLUCOSE 104* 100* 115* 117* 144*  BUN 22 18 21 19 16   CREATININE 0.94 0.93 1.09 1.26 1.04  CALCIUM 9.3 8.9 8.7 8.4 8.3*  MG 2.2 2.0 -- -- --  PHOS -- -- -- -- --   Liver Function Tests:  Lab 07/18/12 0841 07/17/12 0520 07/15/12 1854  AST 78* 60* 49*  ALT 50 35 30  ALKPHOS 60 56 64  BILITOT 0.2* 0.2* 0.4  PROT 6.2 6.0 6.0  ALBUMIN 2.4* 2.5* 2.7*   CBC:  Lab 07/18/12 0841 07/17/12 0520 07/16/12 0605 07/15/12 1854 07/15/12 0804  WBC 5.6 7.4 13.8* 13.0* 9.6  NEUTROABS -- -- -- 12.5* 8.1*  HGB 11.1* 10.9* 11.4* 11.5* 12.7*  HCT 32.1* 32.4* 34.7* 33.9* 36.8*  MCV 97.6 97.3 98.6 97.4 97.4  PLT 179 175 168 162 198   Cardiac Enzymes:  Lab 07/16/12 0605 07/15/12 2350 07/15/12 1840 07/15/12 0805  CKTOTAL 407* -- 259* --  CKMB 4.5* -- 3.9 --  CKMBINDEX -- -- -- --  TROPONINI 1.94* 1.97* 1.22* <0.30   BNP (last 3 results)  Basename 07/18/12 0921 07/26/11 1324  PROBNP 5879.0* 56.0    Recent Results (from the past 240 hour(s))  URINE CULTURE     Status: Normal   Collection Time   07/15/12  3:16 PM      Component Value Range Status Comment   Specimen Description URINE, CLEAN CATCH   Final    Special Requests NONE   Final    Culture  Setup Time 07/15/2012 21:42   Final    Colony Count >=100,000 COLONIES/ML   Final    Culture ESCHERICHIA COLI   Final    Report Status 07/17/2012 FINAL   Final    Organism ID, Bacteria ESCHERICHIA COLI   Final   CULTURE, BLOOD (ROUTINE X 2)     Status: Normal   Collection Time   07/15/12  6:39 PM      Component Value Range Status Comment   Specimen Description BLOOD RIGHT HAND   Final    Special  Requests BOTTLES DRAWN AEROBIC AND ANAEROBIC 10 CC   Final    Culture  Setup Time 07/16/2012 13:16   Final    Culture     Final    Value: ESCHERICHIA COLI     Note: Gram Stain Report Called to,Read Back By and Verified With: NOLAN KUFFOUL 07/16/12 @ 8:30PM BY RUSCA.   Report Status 07/18/2012 FINAL   Final    Organism ID, Bacteria ESCHERICHIA COLI   Final   CULTURE, BLOOD (ROUTINE X 2)     Status: Normal   Collection Time   07/15/12  6:44 PM      Component Value Range Status Comment   Specimen Description BLOOD RIGHT ARM   Final    Special Requests BOTTLES DRAWN AEROBIC AND ANAEROBIC 10CC   Final    Culture  Setup Time 07/16/2012 13:16   Final    Culture     Final    Value: ESCHERICHIA COLI     Note: SUSCEPTIBILITIES PERFORMED ON PREVIOUS CULTURE WITHIN THE LAST 5 DAYS.     Note: Gram Stain Report Called to,Read Back By  and Verified With: NOLAN KUFFOUL 07/16/12 @ 8:30PM BY RUSCA.   Report Status 07/18/2012 FINAL   Final   URINE CULTURE     Status: Normal   Collection Time   07/15/12  7:57 PM      Component Value Range Status Comment   Specimen Description URINE, CATHETERIZED   Final    Special Requests NONE   Final    Culture  Setup Time 07/16/2012 13:26   Final    Colony Count >=100,000 COLONIES/ML   Final    Culture ESCHERICHIA COLI   Final    Report Status 07/18/2012 FINAL   Final    Organism ID, Bacteria ESCHERICHIA COLI   Final   MRSA PCR SCREENING     Status: Normal   Collection Time   07/16/12  9:57 AM      Component Value Range Status Comment   MRSA by PCR NEGATIVE  NEGATIVE Final   CULTURE, BLOOD (ROUTINE X 2)     Status: Normal (Preliminary result)   Collection Time   07/16/12  5:06 PM      Component Value Range Status Comment   Specimen Description BLOOD RIGHT ARM   Final    Special Requests BOTTLES DRAWN AEROBIC AND ANAEROBIC 10CC   Final    Culture  Setup Time 07/16/2012 23:40   Final    Culture     Final    Value:        BLOOD CULTURE RECEIVED NO GROWTH TO  DATE CULTURE WILL BE HELD FOR 5 DAYS BEFORE ISSUING A FINAL NEGATIVE REPORT   Report Status PENDING   Incomplete   CULTURE, BLOOD (ROUTINE X 2)     Status: Normal (Preliminary result)   Collection Time   07/16/12  5:15 PM      Component Value Range Status Comment   Specimen Description BLOOD RIGHT HAND   Final    Special Requests BOTTLES DRAWN AEROBIC ONLY 3CC   Final    Culture  Setup Time 07/16/2012 23:40   Final    Culture     Final    Value:        BLOOD CULTURE RECEIVED NO GROWTH TO DATE CULTURE WILL BE HELD FOR 5 DAYS BEFORE ISSUING A FINAL NEGATIVE REPORT   Report Status PENDING   Incomplete      Studies:  Recent x-ray studies have been reviewed in detail by the Attending Physician  Scheduled Meds:  Reviewed in detail by the Attending Physician   [name], ANP [facility] Office  [phone] Pager [phone]  On-Call/Text Page:      [email or web address]      password [email or web address]  If 7PM-7AM, please contact night-coverage [email or web address] Password [email or web address] 07/19/2012, 1:28 PM   LOS: 4 days   I have personally examined this patient and reviewed the entire database. I have reviewed the above note, made any necessary editorial changes, and agree with its content.  Lonia Blood, MD [facility]

## 2012-07-20 ENCOUNTER — Inpatient Hospital Stay (HOSPITAL_COMMUNITY): Payer: Medicare Other

## 2012-07-20 LAB — BASIC METABOLIC PANEL
BUN: 23 mg/dL (ref 6–23)
CO2: 28 mEq/L (ref 19–32)
Chloride: 102 mEq/L (ref 96–112)
Creatinine, Ser: 0.82 mg/dL (ref 0.50–1.35)
GFR calc Af Amer: >90 mL/min (ref >90)
Glucose, Bld: 97 mg/dL (ref 70–99)
Potassium: 3.5 mEq/L (ref 3.5–5.1)

## 2012-07-20 LAB — GLUCOSE, CAPILLARY

## 2012-07-20 MED ORDER — PREDNISONE 20 MG PO TABS
30.0000 mg | ORAL_TABLET | Freq: Every day | ORAL | Status: DC
  Administered 2012-07-21: 30 mg via ORAL
  Filled 2012-07-20 (×3): qty 1

## 2012-07-20 MED ORDER — POTASSIUM CHLORIDE CRYS ER 20 MEQ PO TBCR
20.0000 meq | EXTENDED_RELEASE_TABLET | Freq: Every day | ORAL | Status: DC
  Administered 2012-07-20 – 2012-07-22 (×3): 20 meq via ORAL
  Filled 2012-07-20 (×3): qty 1

## 2012-07-20 MED ORDER — BISACODYL 10 MG RE SUPP: 10.0000 mg | Freq: Every day | RECTAL | Status: DC | PRN

## 2012-07-20 MED ORDER — MORPHINE SULFATE 2 MG/ML IJ SOLN
INTRAMUSCULAR | Status: AC
  Filled 2012-07-20: qty 1

## 2012-07-20 MED ORDER — ISOSORBIDE MONONITRATE ER 60 MG PO TB24
90.0000 mg | ORAL_TABLET | Freq: Every day | ORAL | Status: DC
  Administered 2012-07-21 – 2012-07-22 (×2): 90 mg via ORAL
  Filled 2012-07-20 (×2): qty 1

## 2012-07-20 MED ORDER — POLYETHYLENE GLYCOL 3350 17 G PO PACK
17.0000 g | PACK | Freq: Every day | ORAL | Status: DC
  Administered 2012-07-21: 17 g via ORAL
  Filled 2012-07-20 (×3): qty 1

## 2012-07-20 MED ORDER — MORPHINE SULFATE 2 MG/ML IJ SOLN
1.0000 mg | INTRAMUSCULAR | Status: DC | PRN
  Administered 2012-07-20 (×2): 1 mg via INTRAVENOUS
  Filled 2012-07-20: qty 1

## 2012-07-20 MED ORDER — MAGNESIUM HYDROXIDE 400 MG/5ML PO SUSP: 15.0000 mL | Freq: Every day | ORAL | Status: DC | PRN

## 2012-07-20 MED ORDER — SULFAMETHOXAZOLE-TMP DS 800-160 MG PO TABS
1.0000 | ORAL_TABLET | Freq: Two times a day (BID) | ORAL | Status: DC
  Administered 2012-07-20 – 2012-07-22 (×5): 1 via ORAL
  Filled 2012-07-20 (×6): qty 1

## 2012-07-20 MED ORDER — FLEET ENEMA 7-19 GM/118ML RE ENEM
1.0000 | ENEMA | Freq: Every day | RECTAL | Status: DC | PRN
  Filled 2012-07-20: qty 1

## 2012-07-20 MED ORDER — ISOSORBIDE MONONITRATE ER 30 MG PO TB24
30.0000 mg | ORAL_TABLET | Freq: Once | ORAL | Status: AC
  Administered 2012-07-20: 30 mg via ORAL
  Filled 2012-07-20 (×2): qty 1

## 2012-07-20 NOTE — Progress Notes (Signed)
No EKG changes, Pt c/o radiating, sharp pain to R neck. [name], PA in room now. Will cont to monitor pt. [name]

## 2012-07-20 NOTE — Progress Notes (Signed)
Pt c/o 5/10 left sided CP. VS stable. Nitro given x3, Morphine 1mg  given. Pt on 2L O2, EKG done, [place] notified.

## 2012-07-20 NOTE — Progress Notes (Signed)
Patient: [name] / Admit Date: 07/15/2012 / Date of Encounter: 07/20/2012, 7:54 AM   Subjective  Feels well. No CP.  Patient is still dyspneic with activity.  He states that his oxygen level dropped somewhat during the night.  This morning O2 saturations are satisfactory  Echo results yesterday: - Left ventricle: The cavity size was normal. Wall thickness was normal. Systolic function was mildly to moderately reduced. The estimated ejection fraction was in the range of 40% to 45%. - Left atrium: The atrium was moderately dilated.   Objective   Telemetry: NSR Physical Exam: Filed Vitals:   07/20/12 0700  BP: 142/64  Pulse: 66  Temp: 98 F (36.7 C)  Resp: 17   General: Well developed elderly WM in no acute distress. Head: Normocephalic, sclera non-icteric, no xanthomas, nares are without discharge. L semicircle scalp incision with staples, dried blood, no dehiscence or suppuration. Neck: JVD mildly elevated. Lungs: Faint crackles at bases.  Breathing is unlabored. Heart: RRR S1 S2 with soft systolic ejection murmur at the base, no rub  or gallops.  Abdomen: Soft, non-tender, non-distended with normoactive bowel sounds. No hepatomegaly. No rebound/guarding. No obvious abdominal masses. Msk:  Strength and tone appear normal for age. Extremities: No clubbing or cyanosis. No edema.  Distal pedal pulses are 2+ and equal bilaterally.  Neuro: Alert and oriented X 3. Moves all extremities spontaneously. Psych:  Responds to questions appropriately with a normal affect.    Intake/Output Summary (Last 24 hours) at 07/20/12 0754 Last data filed at 07/20/12 0400  Gross per 24 hour  Intake    770 ml  Output   1700 ml  Net   -930 ml    Inpatient Medications:     . antiseptic oral rinse  15 mL Mouth Rinse BID  . aspirin  81 mg Oral Daily  . atorvastatin  80 mg Oral q1800  . calcium-vitamin D  1 tablet Oral BID  . carbamazepine  300 mg Oral BID  . cefTRIAXone (ROCEPHIN)  IV  2  g Intravenous Daily  . clopidogrel  75 mg Oral QAC breakfast  . docusate sodium  200 mg Oral QHS  . famotidine  20 mg Oral Daily  . finasteride  5 mg Oral Daily  . furosemide  40 mg Oral Daily  . ipratropium  0.5 mg Nebulization Q4H  . isosorbide mononitrate  60 mg Oral Daily  . levalbuterol  0.63 mg Nebulization Q4H  . metoprolol succinate  50 mg Oral Daily  . mometasone-formoterol  2 puff Inhalation BID  . potassium chloride SA  20 mEq Oral Q M,W,F  . potassium chloride  20 mEq Oral Daily  . predniSONE  60 mg Oral Q breakfast  . sertraline  150 mg Oral Daily    Labs:  [facility] 07/20/12 0500 07/19/12 0450  NA 139 135  K 3.5 3.7  CL 102 100  CO2 28 27  GLUCOSE 97 104*  BUN 23 22  CREATININE 0.82 0.94  CALCIUM 9.7 9.3  MG 2.1 2.2  PHOS -- --    Basename 07/18/12 0841  AST 78*  ALT 50  ALKPHOS 60  BILITOT 0.2*  PROT 6.2  ALBUMIN 2.4*    Basename 07/18/12 0841  WBC 5.6  NEUTROABS --  HGB 11.1*  HCT 32.1*  MCV 97.6  PLT 179   No results found for this basename: HGBA1C in the last 72 hours  Radiology/Studies:  Echo as above  Ct Head Wo Contrast 07/15/2012  *RADIOLOGY  REPORT*  Clinical Data: The patient fell hitting head.  Positive loss of consciousness with a frontal scalp laceration.  CT HEAD WITHOUT CONTRAST,CT CERVICAL SPINE WITHOUT CONTRAST  Technique:  Contiguous axial images were obtained from the base of the skull through the vertex without contrast.,Technique: Multidetector CT imaging of the cervical spine was performed. Multiplanar CT image reconstructions were also generated.  Comparison: None.  Findings: Head CT:  The ventricles are normal in configuration. There is ventricular and sulcal enlargement reflecting moderate age related atrophy.  No parenchymal masses or mass effect.  Mild patchy white matter hypoattenuation is noted most consistent with chronic microvascular ischemic change.  There is no evidence of a recent infarct.  No intracranial  hemorrhage.  No extra-axial masses or abnormal fluid collections.  No skull fracture.  The visualized sinuses and mastoid air cells are clear.  There is a fracture of the base of the left nasal bone.  This could be chronic or acute.  Cervical spine CT:  There is no fracture or spondylolisthesis. There is some resorption at the tip of the odontoid process that appears chronic.  There is loss of disc height with endplate osteophytes throughout most of the cervical spine with endplate spurring and facet spurring leading to varying degrees of neural foraminal narrowing.  Neural foraminal narrowing is greatest bilaterally at C5-C6, moderate to severe bilaterally.  The bones are demineralized.  The surrounding soft tissues are unremarkable.  The lung apices are clear.  IMPRESSION: Head CT:  No acute intracranial abnormality.  Fracture at the base of the left nasal bone may be acute or chronic.  Cervical spine CT:  No fracture or acute finding   Original Report Authenticated By: Amie Portland, M.D.    Ct Cervical Spine Wo Contrast 07/15/2012  *RADIOLOGY REPORT*  Clinical Data: The patient fell hitting head.  Positive loss of consciousness with a frontal scalp laceration.  CT HEAD WITHOUT CONTRAST,CT CERVICAL SPINE WITHOUT CONTRAST  Technique:  Contiguous axial images were obtained from the base of the skull through the vertex without contrast.,Technique: Multidetector CT imaging of the cervical spine was performed. Multiplanar CT image reconstructions were also generated.  Comparison: None.  Findings: Head CT:  The ventricles are normal in configuration. There is ventricular and sulcal enlargement reflecting moderate age related atrophy.  No parenchymal masses or mass effect.  Mild patchy white matter hypoattenuation is noted most consistent with chronic microvascular ischemic change.  There is no evidence of a recent infarct.  No intracranial hemorrhage.  No extra-axial masses or abnormal fluid collections.  No skull  fracture.  The visualized sinuses and mastoid air cells are clear.  There is a fracture of the base of the left nasal bone.  This could be chronic or acute.  Cervical spine CT:  There is no fracture or spondylolisthesis. There is some resorption at the tip of the odontoid process that appears chronic.  There is loss of disc height with endplate osteophytes throughout most of the cervical spine with endplate spurring and facet spurring leading to varying degrees of neural foraminal narrowing.  Neural foraminal narrowing is greatest bilaterally at C5-C6, moderate to severe bilaterally.  The bones are demineralized.  The surrounding soft tissues are unremarkable.  The lung apices are clear.  IMPRESSION: Head CT:  No acute intracranial abnormality.  Fracture at the base of the left nasal bone may be acute or chronic.  Cervical spine CT:  No fracture or acute finding   Original Report Authenticated By: Amie Portland,  M.D.    Dg Chest Port 1 View 07/17/2012  *RADIOLOGY REPORT*  Clinical Data: 78 year old male with fever, wheezing, chronic obstructive pulmonary disease.  Myocardial infarction.  PORTABLE CHEST - 1 VIEW  Comparison: 07/16/2012 and earlier.  Findings: Portable semi upright AP view 0715 hours.  Resuscitation pad remain in place.  Stable sequelae of CABG.  Stable cardiac size and mediastinal contours.  No pneumothorax or pulmonary edema. Small right pleural effusion.  No consolidation.  Stable ventilation.  IMPRESSION: Small right pleural effusion.  No other acute cardiopulmonary abnormality.   Original Report Authenticated By: Erskine Speed, M.D.     Assessment and Plan  1. Acute anterolateral STEMI s/p BMS to LAD 07/15/12 (hx of CABG) - continue ASA, Lipitor, Plavix, Imdur, Toprol. 2. VT on admission s/p defib/amiodarone - quiescent, likely ischemic s/p revascularization. 3. Acute CHF ?systolic with EF 45-40% by echo 07/18/12 - 2 doses of Lasix yesterday - not yet weighed today, but downtrend noted by  weights yesterday. Net -2620 yesterday. Continue IV Lasix through this morning - further diuretic mgmt per MD. 4. Escherichia coli bacteremia/SIRS on admission, likely secondary to UTI/prostatitis: appreciate IM assistance.  5. HTN - controlled. 6. Head laceration 2/2 fall s/p sutures/stables in ED 07/15/12 - will need removal of sutures 7-10 days (11/23-11/26). Will ask nursing to gently clean area today. 7. COPD - per PCCM, continue inhaled corticosteroid/Spiriva, plan outpatient PFTs, cardiopulm rehab, and follow for possible home oxygen needs (normal O2 sat at rest but reported desat with ambulation - patient has required home O2 in past after hospitalizations). 8. Anemia- stable. 9. Transaminitis - felt due to MI. 10. Hyperlipidemia - continue statin.  Plan: Will increase potassium repletion.  Potassium today is 3.5 We'll get chest x-ray PA and lateral chest x-ray today. I discussed the outpatient cardiac rehabilitation with the patient.  He may have transportation issues.  He will discuss further with cardiac rehabilitation nurse today.  We will arrange for home health RN after discharge.  We will plan for outpatient pulmonary function studies and pulmonary followup after discharge.  We will switch him to Bactrim today.  He will need 2 weeks of Bactrim for his prostatitis.  Anticipate probable discharge home Friday, November 22.

## 2012-07-20 NOTE — Progress Notes (Signed)
Called by RN because patient was having chest pain. The pain had started prior to exertion. It was radiating up in to the right side of his neck. It was a 5/10 at its worst. He was started on oxygen and given sublingual nitroglycerin glycerin x2 as well as morphine. He is also currently getting a scheduled nebulizer. The patient states the pain has decreased significantly and almost completely resolved. It was his usual angina. ECG was performed and reviewed with no significant changes noted. His systolic blood pressure has been in the 120s to the 140s but decreased to the 90s after the nitroglycerin. His oxygen saturation was 89% but now is up to 93%. Results from his cardiac catheterization on 11/16 are summarized below. The full cath report also mentions distal vasospasm in the LAD treated with intracardiac nitroglycerin.   1. Moderate disease in the left main coronary artery. 2. Occluded proximal left anterior descending artery. LIMA to LAD is widely patent. 90% mid LAD lesion was successfully stented with a 2.5 x 15 vision bare metal stent. 3. Moderate disease in the proximal left circumflex. Large OM 2 is occluded and fills retrograde, by left to left collaterals. 4. Occluded native proximal right coronary artery. Patent free RIMA to distal RCA. Moderate disease in the posterior lateral artery. 5. Overall normal left ventricular systolic function. Mild, mid anterior hypokinesis. LVEDP 11 mmHg. Ejection fraction 50 %   Plan: Continue current medical therapy, will increase Imdur to 90 mg daily. Add continuous oxygen for now. Continue to follow and treat symptomatically.

## 2012-07-20 NOTE — Consult Note (Signed)
[facility] Consult Note [place] TEAM 1 - Stepdown/ICU TEAM   CIRO TASHIRO ZOX:[id number] DOB: 08-07-1925 DOA: 07/15/2012 PCP: Evette Georges, MD  Brief narrative: 78 year old male patient with history of CAD, MI, status post CABG, chronic diastolic CHF with EF 60-65% in 2010, HTN, HL, COPD-not on home O2, chronic right foot drop, prior? Bacteremia, chronic back pain presented on 11/16 after a fall, subsequent chest pain and sustained V. tach requiring DC cardioversion. He was found to have acute STEMI-Cath on 07/15/12- LIMA to LAD is widely patent. BMS to 90% mid LAD. Ejection fraction by ventriculogram was 50 %. Patient and family indicated that he had been having fevers and dysuria prior to admission. He had been seen by urology and was diagnosed with prostatitis but had not yet been started on antibiotics. After admission to the hospital, he was initially started on Cipro and then switched to Zosyn on 11/18 due to fever 102F.    Assessment/Plan:  Acute MI, anterolateral wall/STEMI/status post bare-metal stent to LAD *Management per cardiology  Acute systolic congestive heart failure, NYHA class 2-EF 40-45% *Lungs CTA and AM CXR shows improved aeration with small pleural effusions therefore will dc Lasix *Hypoxia improving and likely has component of deconditioning contributing- RA sats >90%  Prostatitis/ E. coli UTI (urinary tract infection) *Continue Rocephin for now *Consulting internal medicine physician did discuss the case with infectious disease physician and recommendation is transition to oral Bactrim DS twice a day with a total of 2 weeks therapy to cover for prostatitis *Outpatient urologist is Dr. Eskridge-recommend OP follow up  SIRS (systemic inflammatory response syndrome) [place] due to Escherichia coli *Hemodynamically stable *Continue antibiotic coverage as above  HYPERLIPIDEMIA *Continue statin  HYPERTENSION *Continue metoprolol  Elevated  transaminase level *Likely related to cardiac arrest time of admission and passive congestion secondary to heart failure  *AST remains elevated o/w LFT's WNL  ?COPD (chronic obstructive pulmonary disease) *Taper prednisone, dc dulera and nebulizers *No prior h/o smoking, asthma or COPD  Laceration of head *Site unremarkable. Will need staple removal 7-10 days post initial laceration repair estimated due 07/25/12  Glucose intolerance (impaired glucose tolerance) *Hemoglobin A1c 5.8 - will need additional followup by primary care physician after discharge - follow CBG during hospital stay   Normocytic anemia *Hemoglobin stable and no evidence of active bleeding  Peripheral neuropathy *Continue Tegretol  DVT prophylaxis: SCDs Code Status: Full Family Communication: Spoke with patient and his wife Disposition Plan: At discretion of primary team. From an internal medicine standpoint he is appropriate to transfer to telemetry  Consultants: Internal medicine/[facility] Care medicine-signed off  Procedures: Left heart catheterization with selective coronary angiography and left ventriculogram with placement of bare-metal stent on 07/15/2012 by Dr. Eldridge Dace  Antibiotics: Zosyn 11/16 >>>11/19 Cipro 11/16 >>>11/19 Rocephin 11/19 >>>11/21 Bactrim 11/21 >>>  HPI/Subjective: Patient alert and sitting on side of bed eating breakfast- no complaints.  Objective: Blood pressure 134/64, pulse 66, temperature 98 F (36.7 C), temperature source Oral, resp. rate 18, height 5' 10.08" (1.78 m), weight 79.1 kg (174 lb 6.1 oz), SpO2 93.00%.  Intake/Output Summary (Last 24 hours) at 07/20/12 1026 Last data filed at 07/20/12 0900  Gross per 24 hour  Intake    840 ml  Output   1700 ml  Net   -860 ml     Exam: General: No acute respiratory distress Lungs: Clear to auscultation bilaterally without wheezes or crackles - upper airway wheezing, room air Cardiovascular: Regular rate and  rhythm without murmur  gallop or rub normal S1 and S2, sinus rhythm, trace peripheral edema of lower extremities Abdomen: Nontender, nondistended, soft, bowel sounds positive, no rebound, no ascites, no appreciable mass Musculoskeletal: No significant cyanosis, clubbing of bilateral lower extremities Neurological: Patient is alert and oriented x3, moves all extremities x4, exam is nonfocal  Data Reviewed: Basic Metabolic Panel:  Lab 07/20/12 1610 07/19/12 0450 07/18/12 0841 07/17/12 0520 07/16/12 0605  NA 139 135 136 136 137  K 3.5 3.7 3.9 4.0 4.3  CL 102 100 103 103 103  CO2 28 27 25 26 24   GLUCOSE 97 104* 100* 115* 117*  BUN 23 22 18 21 19   CREATININE 0.82 0.94 0.93 1.09 1.26  CALCIUM 9.7 9.3 8.9 8.7 8.4  MG 2.1 2.2 2.0 -- --  PHOS -- -- -- -- --   Liver Function Tests:  Lab 07/18/12 0841 07/17/12 0520 07/15/12 1854  AST 78* 60* 49*  ALT 50 35 30  ALKPHOS 60 56 64  BILITOT 0.2* 0.2* 0.4  PROT 6.2 6.0 6.0  ALBUMIN 2.4* 2.5* 2.7*   CBC:  Lab 07/18/12 0841 07/17/12 0520 07/16/12 0605 07/15/12 1854 07/15/12 0804  WBC 5.6 7.4 13.8* 13.0* 9.6  NEUTROABS -- -- -- 12.5* 8.1*  HGB 11.1* 10.9* 11.4* 11.5* 12.7*  HCT 32.1* 32.4* 34.7* 33.9* 36.8*  MCV 97.6 97.3 98.6 97.4 97.4  PLT 179 175 168 162 198   Cardiac Enzymes:  Lab 07/16/12 0605 07/15/12 2350 07/15/12 1840 07/15/12 0805  CKTOTAL 407* -- 259* --  CKMB 4.5* -- 3.9 --  CKMBINDEX -- -- -- --  TROPONINI 1.94* 1.97* 1.22* <0.30   BNP (last 3 results)  Basename 07/18/12 0921 07/26/11 1324  PROBNP 5879.0* 56.0    Recent Results (from the past 240 hour(s))  URINE CULTURE     Status: Normal   Collection Time   07/15/12  3:16 PM      Component Value Range Status Comment   Specimen Description URINE, CLEAN CATCH   Final    Special Requests NONE   Final    Culture  Setup Time 07/15/2012 21:42   Final    Colony Count >=100,000 COLONIES/ML   Final    Culture ESCHERICHIA COLI   Final    Report Status 07/17/2012 FINAL    Final    Organism ID, Bacteria ESCHERICHIA COLI   Final   CULTURE, BLOOD (ROUTINE X 2)     Status: Normal   Collection Time   07/15/12  6:39 PM      Component Value Range Status Comment   Specimen Description BLOOD RIGHT HAND   Final    Special Requests BOTTLES DRAWN AEROBIC AND ANAEROBIC 10 CC   Final    Culture  Setup Time 07/16/2012 13:16   Final    Culture     Final    Value: ESCHERICHIA COLI     Note: Gram Stain Report Called to,Read Back By and Verified With: NOLAN KUFFOUL 07/16/12 @ 8:30PM BY RUSCA.   Report Status 07/18/2012 FINAL   Final    Organism ID, Bacteria ESCHERICHIA COLI   Final   CULTURE, BLOOD (ROUTINE X 2)     Status: Normal   Collection Time   07/15/12  6:44 PM      Component Value Range Status Comment   Specimen Description BLOOD RIGHT ARM   Final    Special Requests BOTTLES DRAWN AEROBIC AND ANAEROBIC 10CC   Final    Culture  Setup Time 07/16/2012 13:16  Final    Culture     Final    Value: ESCHERICHIA COLI     Note: SUSCEPTIBILITIES PERFORMED ON PREVIOUS CULTURE WITHIN THE LAST 5 DAYS.     Note: Gram Stain Report Called to,Read Back By and Verified With: NOLAN KUFFOUL 07/16/12 @ 8:30PM BY RUSCA.   Report Status 07/18/2012 FINAL   Final   URINE CULTURE     Status: Normal   Collection Time   07/15/12  7:57 PM      Component Value Range Status Comment   Specimen Description URINE, CATHETERIZED   Final    Special Requests NONE   Final    Culture  Setup Time 07/16/2012 13:26   Final    Colony Count >=100,000 COLONIES/ML   Final    Culture ESCHERICHIA COLI   Final    Report Status 07/18/2012 FINAL   Final    Organism ID, Bacteria ESCHERICHIA COLI   Final   MRSA PCR SCREENING     Status: Normal   Collection Time   07/16/12  9:57 AM      Component Value Range Status Comment   MRSA by PCR NEGATIVE  NEGATIVE Final   CULTURE, BLOOD (ROUTINE X 2)     Status: Normal (Preliminary result)   Collection Time   07/16/12  5:06 PM      Component Value Range Status  Comment   Specimen Description BLOOD RIGHT ARM   Final    Special Requests BOTTLES DRAWN AEROBIC AND ANAEROBIC 10CC   Final    Culture  Setup Time 07/16/2012 23:40   Final    Culture     Final    Value:        BLOOD CULTURE RECEIVED NO GROWTH TO DATE CULTURE WILL BE HELD FOR 5 DAYS BEFORE ISSUING A FINAL NEGATIVE REPORT   Report Status PENDING   Incomplete   CULTURE, BLOOD (ROUTINE X 2)     Status: Normal (Preliminary result)   Collection Time   07/16/12  5:15 PM      Component Value Range Status Comment   Specimen Description BLOOD RIGHT HAND   Final    Special Requests BOTTLES DRAWN AEROBIC ONLY 3CC   Final    Culture  Setup Time 07/16/2012 23:40   Final    Culture     Final    Value:        BLOOD CULTURE RECEIVED NO GROWTH TO DATE CULTURE WILL BE HELD FOR 5 DAYS BEFORE ISSUING A FINAL NEGATIVE REPORT   Report Status PENDING   Incomplete      Studies:  Recent x-ray studies have been reviewed in detail by the Attending Physician  Scheduled Meds:  Reviewed in detail by the Attending Physician   [name], ANP [facility] Office  [phone] Pager [phone]  On-Call/Text Page:      [email or web address]      password [email or web address]  If 7PM-7AM, please contact night-coverage [email or web address] Password [email or web address] 07/20/2012, 10:26 AM   LOS: 5 days   I have examined the patient, reviewed the chart and modified the above note which I agree with. D/c lasix as he is no longer fluid overloaded. No h/o COPD or asthma therefore will stop inhalers, nebs and start rapid taper of prednisone. Agree with cardiac rehab.    Calvert Cantor, MD [phone]

## 2012-07-20 NOTE — Progress Notes (Signed)
Chaplain visited pt while rounding in 2600. Pt's wife and son present also. Chaplain had been with pt last Saturday in ED and with family while pt was in Cath Lab. Chaplain offered spiritual and emotional support. Pt stated he anticipates going home tomorrow.

## 2012-07-20 NOTE — Progress Notes (Signed)
Agree with above assessment and plan 

## 2012-07-20 NOTE — Progress Notes (Signed)
CARDIAC REHAB PHASE I   PRE:  Rate/Rhythm: 68 SR    BP: sitting 131/64    SaO2: 92 1L, 89-91 RA on EOB  MODE:  Ambulation: 200 ft   POST:  Rate/Rhythm: 77 SR    BP: sitting 135/76     SaO2: 91-95 RA  Pt needing O2 this am in bed. On EOB SaO2 borderline. Pt sts also he is having left chest tightness. Worse with deep breath. Sts 5/10. Appears well, comfortable. Able to walk with his walker off O2, SaO2 good. Pt sts chest tightness did increase with walk but still sts 5/10. Pt seems to feel comfortable. To recliner. When further asked pt sts this sensation is like his previous angina and that he would like to try NTG. Applied 2L O2 and called RN who gave NTG x2. At this point no relief per pt. Interested in CRPII and will send referral to [place], who will call in 3 weeks to discuss logistics. [phone]  Harriet Masson CES, ACSM

## 2012-07-21 LAB — BASIC METABOLIC PANEL
BUN: 23 mg/dL (ref 6–23)
CO2: 27 mEq/L (ref 19–32)
Chloride: 100 mEq/L (ref 96–112)
GFR calc non Af Amer: 77 mL/min — ABNORMAL LOW (ref >90)
Glucose, Bld: 81 mg/dL (ref 70–99)
Potassium: 3.9 mEq/L (ref 3.5–5.1)
Sodium: 136 mEq/L (ref 135–145)

## 2012-07-21 LAB — GLUCOSE, CAPILLARY: Glucose-Capillary: 127 mg/dL — ABNORMAL HIGH (ref 70–99)

## 2012-07-21 MED ORDER — PREDNISONE 20 MG PO TABS
20.0000 mg | ORAL_TABLET | Freq: Once | ORAL | Status: DC
  Filled 2012-07-21: qty 1

## 2012-07-21 MED ORDER — LEVALBUTEROL HCL 0.63 MG/3ML IN NEBU
0.6300 mg | INHALATION_SOLUTION | Freq: Three times a day (TID) | RESPIRATORY_TRACT | Status: DC
  Administered 2012-07-21 – 2012-07-22 (×4): 0.63 mg via RESPIRATORY_TRACT
  Filled 2012-07-21 (×6): qty 3

## 2012-07-21 MED ORDER — METOPROLOL SUCCINATE ER 50 MG PO TB24
75.0000 mg | ORAL_TABLET | Freq: Every day | ORAL | Status: DC
  Administered 2012-07-21 – 2012-07-22 (×2): 75 mg via ORAL
  Filled 2012-07-21 (×2): qty 1

## 2012-07-21 MED ORDER — PREDNISONE 10 MG PO TABS
10.0000 mg | ORAL_TABLET | Freq: Once | ORAL | Status: DC
  Filled 2012-07-21: qty 1

## 2012-07-21 MED ORDER — FUROSEMIDE 40 MG PO TABS
40.0000 mg | ORAL_TABLET | Freq: Every day | ORAL | Status: DC
  Administered 2012-07-21 – 2012-07-22 (×2): 40 mg via ORAL
  Filled 2012-07-21 (×2): qty 1

## 2012-07-21 MED ORDER — IPRATROPIUM BROMIDE 0.02 % IN SOLN
0.5000 mg | Freq: Three times a day (TID) | RESPIRATORY_TRACT | Status: DC
  Administered 2012-07-21 – 2012-07-22 (×4): 0.5 mg via RESPIRATORY_TRACT
  Filled 2012-07-21 (×3): qty 2.5

## 2012-07-21 MED ORDER — PREDNISONE 20 MG PO TABS
20.0000 mg | ORAL_TABLET | Freq: Once | ORAL | Status: AC
  Administered 2012-07-22: 20 mg via ORAL
  Filled 2012-07-21: qty 1

## 2012-07-21 NOTE — Progress Notes (Signed)
Patient: [name] / Admit Date: 07/15/2012 / Date of Encounter: 07/21/2012, 8:37 AM   Subjective  Yesterday the patient had some chest pain not responsive to several nitroglycerin and he required morphine on 2 occasions yesterday for relief of chest pain.  This morning he has had no pain at rest.  His rhythm remains stable.  Blood pressure satisfactory.  Echo results yesterday: - Left ventricle: The cavity size was normal. Wall thickness was normal. Systolic function was mildly to moderately reduced. The estimated ejection fraction was in the range of 40% to 45%. - Left atrium: The atrium was moderately dilated.   Objective   Telemetry: NSR Physical Exam: Filed Vitals:   07/21/12 0826  BP: 138/97  Pulse: 70  Temp: 97.5 F (36.4 C)  Resp: 16   General: Well developed elderly WM in no acute distress. Head: Normocephalic, sclera non-icteric, no xanthomas, nares are without discharge. L semicircle scalp incision with staples, dried blood, no dehiscence or suppuration. Neck: JVD mildly elevated. Lungs: Faint crackles at bases.  Breathing is unlabored. Heart: RRR S1 S2 with soft systolic ejection murmur at the base, no rub  or gallops.  Abdomen: Soft, non-tender, non-distended with normoactive bowel sounds. No hepatomegaly. No rebound/guarding. No obvious abdominal masses. Msk:  Strength and tone appear normal for age. Extremities: No clubbing or cyanosis. No edema.  Distal pedal pulses are 2+ and equal bilaterally.  Neuro: Alert and oriented X 3. Moves all extremities spontaneously. Psych:  Responds to questions appropriately with a normal affect.    Intake/Output Summary (Last 24 hours) at 07/21/12 0837 Last data filed at 07/21/12 0418  Gross per 24 hour  Intake    720 ml  Output   1100 ml  Net   -380 ml    Inpatient Medications:     . antiseptic oral rinse  15 mL Mouth Rinse BID  . aspirin  81 mg Oral Daily  . atorvastatin  80 mg Oral q1800  . calcium-vitamin D  1  tablet Oral BID  . carbamazepine  300 mg Oral BID  . clopidogrel  75 mg Oral QAC breakfast  . docusate sodium  200 mg Oral QHS  . famotidine  20 mg Oral Daily  . finasteride  5 mg Oral Daily  . ipratropium  0.5 mg Nebulization TID  . [COMPLETED] isosorbide mononitrate  30 mg Oral Once  . isosorbide mononitrate  90 mg Oral Daily  . levalbuterol  0.63 mg Nebulization TID  . metoprolol succinate  50 mg Oral Daily  . [COMPLETED] morphine      . polyethylene glycol  17 g Oral Daily  . potassium chloride SA  20 mEq Oral Q M,W,F  . potassium chloride  20 mEq Oral Daily  . predniSONE  30 mg Oral Q breakfast  . sertraline  150 mg Oral Daily  . sulfamethoxazole-trimethoprim  1 tablet Oral Q12H  . [DISCONTINUED] furosemide  40 mg Oral Daily  . [DISCONTINUED] ipratropium  0.5 mg Nebulization Q4H  . [DISCONTINUED] isosorbide mononitrate  60 mg Oral Daily  . [DISCONTINUED] levalbuterol  0.63 mg Nebulization Q4H  . [DISCONTINUED] mometasone-formoterol  2 puff Inhalation BID  . [DISCONTINUED] predniSONE  60 mg Oral Q breakfast    Labs:  [facility] 07/21/12 0508 07/20/12 0500  NA 136 139  K 3.9 3.5  CL 100 102  CO2 27 28  GLUCOSE 81 97  BUN 23 23  CREATININE 0.87 0.82  CALCIUM 9.7 9.7  MG 2.1 2.1  PHOS -- --  Basename 07/18/12 0841  AST 78*  ALT 50  ALKPHOS 60  BILITOT 0.2*  PROT 6.2  ALBUMIN 2.4*    Basename 07/18/12 0841  WBC 5.6  NEUTROABS --  HGB 11.1*  HCT 32.1*  MCV 97.6  PLT 179   No results found for this basename: HGBA1C in the last 72 hours  Radiology/Studies:  Echo as above  Ct Head Wo Contrast 07/15/2012  *RADIOLOGY REPORT*  Clinical Data: The patient fell hitting head.  Positive loss of consciousness with a frontal scalp laceration.  CT HEAD WITHOUT CONTRAST,CT CERVICAL SPINE WITHOUT CONTRAST  Technique:  Contiguous axial images were obtained from the base of the skull through the vertex without contrast.,Technique: Multidetector CT imaging of the cervical  spine was performed. Multiplanar CT image reconstructions were also generated.  Comparison: None.  Findings: Head CT:  The ventricles are normal in configuration. There is ventricular and sulcal enlargement reflecting moderate age related atrophy.  No parenchymal masses or mass effect.  Mild patchy white matter hypoattenuation is noted most consistent with chronic microvascular ischemic change.  There is no evidence of a recent infarct.  No intracranial hemorrhage.  No extra-axial masses or abnormal fluid collections.  No skull fracture.  The visualized sinuses and mastoid air cells are clear.  There is a fracture of the base of the left nasal bone.  This could be chronic or acute.  Cervical spine CT:  There is no fracture or spondylolisthesis. There is some resorption at the tip of the odontoid process that appears chronic.  There is loss of disc height with endplate osteophytes throughout most of the cervical spine with endplate spurring and facet spurring leading to varying degrees of neural foraminal narrowing.  Neural foraminal narrowing is greatest bilaterally at C5-C6, moderate to severe bilaterally.  The bones are demineralized.  The surrounding soft tissues are unremarkable.  The lung apices are clear.  IMPRESSION: Head CT:  No acute intracranial abnormality.  Fracture at the base of the left nasal bone may be acute or chronic.  Cervical spine CT:  No fracture or acute finding   Original Report Authenticated By: Amie Portland, M.D.    Ct Cervical Spine Wo Contrast 07/15/2012  *RADIOLOGY REPORT*  Clinical Data: The patient fell hitting head.  Positive loss of consciousness with a frontal scalp laceration.  CT HEAD WITHOUT CONTRAST,CT CERVICAL SPINE WITHOUT CONTRAST  Technique:  Contiguous axial images were obtained from the base of the skull through the vertex without contrast.,Technique: Multidetector CT imaging of the cervical spine was performed. Multiplanar CT image reconstructions were also generated.   Comparison: None.  Findings: Head CT:  The ventricles are normal in configuration. There is ventricular and sulcal enlargement reflecting moderate age related atrophy.  No parenchymal masses or mass effect.  Mild patchy white matter hypoattenuation is noted most consistent with chronic microvascular ischemic change.  There is no evidence of a recent infarct.  No intracranial hemorrhage.  No extra-axial masses or abnormal fluid collections.  No skull fracture.  The visualized sinuses and mastoid air cells are clear.  There is a fracture of the base of the left nasal bone.  This could be chronic or acute.  Cervical spine CT:  There is no fracture or spondylolisthesis. There is some resorption at the tip of the odontoid process that appears chronic.  There is loss of disc height with endplate osteophytes throughout most of the cervical spine with endplate spurring and facet spurring leading to varying degrees of neural foraminal narrowing.  Neural foraminal narrowing is greatest bilaterally at C5-C6, moderate to severe bilaterally.  The bones are demineralized.  The surrounding soft tissues are unremarkable.  The lung apices are clear.  IMPRESSION: Head CT:  No acute intracranial abnormality.  Fracture at the base of the left nasal bone may be acute or chronic.  Cervical spine CT:  No fracture or acute finding   Original Report Authenticated By: Amie Portland, M.D.    Dg Chest Port 1 View 07/17/2012  *RADIOLOGY REPORT*  Clinical Data: 78 year old male with fever, wheezing, chronic obstructive pulmonary disease.  Myocardial infarction.  PORTABLE CHEST - 1 VIEW  Comparison: 07/16/2012 and earlier.  Findings: Portable semi upright AP view 0715 hours.  Resuscitation pad remain in place.  Stable sequelae of CABG.  Stable cardiac size and mediastinal contours.  No pneumothorax or pulmonary edema. Small right pleural effusion.  No consolidation.  Stable ventilation.  IMPRESSION: Small right pleural effusion.  No other acute  cardiopulmonary abnormality.   Original Report Authenticated By: Erskine Speed, M.D.     Assessment and Plan  1. Acute anterolateral STEMI s/p BMS to LAD 07/15/12 (hx of CABG) - continue ASA, Lipitor, Plavix, Imdur, Toprol. 2. VT on admission s/p defib/amiodarone - quiescent, likely ischemic s/p revascularization. 3. Acute CHF ?systolic with EF 16-10% by echo 07/18/12 - Continue lasix 40 mg daily. 4. Escherichia coli bacteremia/SIRS on admission, likely secondary to UTI/prostatitis: To have 2 weeks of Bactrim. 5. HTN - controlled. 6. Head laceration 2/2 fall s/p sutures/stables in ED 07/15/12 - will need removal of sutures 7-10 days (11/23-11/26). Will ask nursing to gently clean area today. 7. COPD - per PCCM, continue inhaled corticosteroid/Spiriva, plan outpatient PFTs, cardiopulm rehab, and follow for possible home oxygen needs (normal O2 sat at rest but reported desat with ambulation - patient has required home O2 in past after hospitalizations). 8. Anemia- stable. 9. Transaminitis - felt due to MI. 10. Hyperlipidemia - continue statin.  Plan: Will increase potassium repletion.  Potassium today is 3.9 Continue lasix 40 mg po. I discussed the outpatient cardiac rehabilitation with the patient.  He may have transportation issues.  He will discuss further with cardiac rehabilitation nurse today.  We will arrange for home health RN after discharge.  We will plan for outpatient pulmonary function studies and pulmonary followup after discharge.    He will need 2 weeks of Bactrim for his prostatitis.  With chest pain requiring morphine yesterday will delay discharge until this weekend. Will increase Toprol to 75 mg daily.

## 2012-07-21 NOTE — Progress Notes (Signed)
Stable from a medicine standpoint. Regarding the UTI/Prostatitis/Bacteremia: He will need a total of 14 days of twice daily Bactrim DS with last dose due 08/03/12. For his COPD we will order a taper of the Prednisone over next two days then stop. For his head laceration staples need to be removed on 07/25/12. We will signoff for now. He will need to see his PCP in 2 weeks for routine hospital follow up.  [name], ANP [phone]

## 2012-07-21 NOTE — Progress Notes (Signed)
.  CARDIAC REHAB PHASE I   PRE:  Rate/Rhythm: 67 SR    BP: sitting 130/61    SaO2: 92 2L RA  MODE:  Ambulation: 340 ft   POST:  Rate/Rhythm: 77    BP: sitting 137/65     SaO2: 87-91 RA  SATURATION QUALIFICATIONS:  Patient Saturations on Room Air at Rest = 91%  Patient Saturations on Room Air while Ambulating = 87%  Patient Saturations on 2 Liters of oxygen while Ambulating = 92%  Statement of medical necessity for home oxygen:  Pt able to ambulate today without CP using his rollator, assist x1. Walked without O2 but SaO2 down. Pt will benefit from home O2 for hypoxia and ischemia. No major c/o. Return to recliner. Discussed ed for home including body mechanics for standing as that is when pt is most unsteady. To have HHRN and PT. Gave HF packet and discussed sodium and daily wts. Pt feels well. [id number]  Harriet Masson CES, ACSM

## 2012-07-22 ENCOUNTER — Encounter (HOSPITAL_COMMUNITY): Payer: Self-pay | Admitting: Cardiology

## 2012-07-22 DIAGNOSIS — I2109 ST elevation (STEMI) myocardial infarction involving other coronary artery of anterior wall: Secondary | ICD-10-CM

## 2012-07-22 DIAGNOSIS — I5021 Acute systolic (congestive) heart failure: Secondary | ICD-10-CM

## 2012-07-22 LAB — BASIC METABOLIC PANEL
BUN: 23 mg/dL (ref 6–23)
CO2: 27 mEq/L (ref 19–32)
Glucose, Bld: 86 mg/dL (ref 70–99)
Potassium: 4.2 mEq/L (ref 3.5–5.1)
Sodium: 136 mEq/L (ref 135–145)

## 2012-07-22 LAB — CULTURE, BLOOD (ROUTINE X 2): Culture: NO GROWTH

## 2012-07-22 MED ORDER — MOMETASONE FURO-FORMOTEROL FUM 100-5 MCG/ACT IN AERO: 2.0000 | INHALATION_SPRAY | Freq: Two times a day (BID) | RESPIRATORY_TRACT | Status: DC

## 2012-07-22 MED ORDER — TIOTROPIUM BROMIDE MONOHYDRATE 18 MCG IN CAPS: 18.0000 ug | ORAL_CAPSULE | Freq: Every day | RESPIRATORY_TRACT | Status: DC

## 2012-07-22 MED ORDER — SULFAMETHOXAZOLE-TMP DS 800-160 MG PO TABS: 1.0000 | ORAL_TABLET | Freq: Two times a day (BID) | ORAL | Status: DC

## 2012-07-22 MED ORDER — FUROSEMIDE 40 MG PO TABS: 40.0000 mg | ORAL_TABLET | Freq: Every day | ORAL | Status: DC

## 2012-07-22 MED ORDER — ISOSORBIDE MONONITRATE ER 60 MG PO TB24: 90.0000 mg | ORAL_TABLET | Freq: Every day | ORAL | Status: DC

## 2012-07-22 MED ORDER — ASPIRIN 81 MG PO CHEW: 81.0000 mg | CHEWABLE_TABLET | Freq: Every day | ORAL | Status: DC

## 2012-07-22 MED ORDER — POTASSIUM CHLORIDE CRYS ER 20 MEQ PO TBCR: 20.0000 meq | EXTENDED_RELEASE_TABLET | Freq: Every day | ORAL | Status: DC

## 2012-07-22 MED ORDER — PREDNISONE 10 MG PO TABS: 10.0000 mg | ORAL_TABLET | Freq: Every day | ORAL | Status: DC

## 2012-07-22 MED ORDER — METOPROLOL SUCCINATE ER 50 MG PO TB24: ORAL_TABLET | ORAL | Status: DC

## 2012-07-22 NOTE — Progress Notes (Signed)
Pt discharged at 16:00 home in care of wife and adult grandson. Prior to d/c pt tolerated OOB in chair for 4 fours, two lengthy walks in unit with RN. Pt uses     3L liters oxygen continuously at home.

## 2012-07-22 NOTE — Progress Notes (Signed)
Cardiac Rehab Phase 1, to patient's room for ambulation.  Assessment prior to walk showed irregular heart rhythm.  Unable to detect p wave in most beats, question if this is a fib.  Reviewed with nurse at monitor and she is to let patients nurse know of this change.  She is currently busy with another patient.  We will check later in the day after physician visit and assessment.  Cathie Olden RN

## 2012-07-22 NOTE — Discharge Summary (Signed)
Discharge Summary   Patient ID: [name] MRN: [id number], DOB/AGE: February 20, 1925 78 y.o.  Primary MD: Evette Georges, MD Primary Cardiologist: Dr. Patty Sermons Admit date: 07/15/2012 D/C date:     07/22/2012      Primary Discharge Diagnoses:  1. Acute anterolateral STEMI  - s/p BMS to mid LAD 07/15/12   2. Ventricular Tachycardia  - s/p defib/amio, likely ischemic, quiescent s/p revascularization.  3. Acute systolic CHF   - EF 40-45% by echo 07/18/12   - ACEI not initiated due to soft BP, Will need to start as outpatient 4. Escherichia coli bacteremia/SIRS  - likely secondary to UTI/prostatitis  - To complete 2 weeks of Bactrim (08/02/12)   - F/u w/ Urologist Dr. Mena Goes 5. Hypertension 6. Head laceration 2/2 fall   s/p sutures/stables in ED 07/15/12 - will need removal of sutures 7-10 days (11/23-11/26) 7. COPD  - continue inhaled corticosteroid/Spiriva, plan outpatient PFTs, cardiopulm rehab, and home oxygen.  8. Anemia 9. Transaminitis  - felt due to MI.  10. Hyperlipidemia  11. Glucose Intolerance  - A1c 5.8, f/u w/ PCP 12. Hypokalemia, resolved   Secondary Discharge Diagnoses:  . Heart murmur   . Peripheral vascular disease     thrombus- in leg- many yrs. ago- ?R- coumadin x1 mth  . Pneumonia     hosp. long time ago   . Blood transfusion     post CABG  . Hepatitis     hepatitis- long time ago, occupational contamination   . GERD (gastroesophageal reflux disease)   . Cancer     prostate - Ca- radiation therapy- 2002  . Peripheral neuropathy   . Hiatal hernia   . Arthritis     hnp, lumbar, ankle (foot drop-R), wears a splint, arthritis - "all over", low bone density   . Depression     uses Zoloft      Allergies No Known Allergies  Diagnostic Studies/Procedures:   07/15/2012 -  CT HEAD WITHOUT CONTRAST,CT CERVICAL SPINE WITHOUT CONTRAST  Findings: Head CT:  The ventricles are normal in configuration. There is ventricular and sulcal enlargement  reflecting moderate age related atrophy.  No parenchymal masses or mass effect.  Mild patchy white matter hypoattenuation is noted most consistent with chronic microvascular ischemic change.  There is no evidence of a recent infarct.  No intracranial hemorrhage.  No extra-axial masses or abnormal fluid collections.  No skull fracture.  The visualized sinuses and mastoid air cells are clear.  There is a fracture of the base of the left nasal bone.  This could be chronic or acute.  Cervical spine CT:  There is no fracture or spondylolisthesis. There is some resorption at the tip of the odontoid process that appears chronic.  There is loss of disc height with endplate osteophytes throughout most of the cervical spine with endplate spurring and facet spurring leading to varying degrees of neural foraminal narrowing.  Neural foraminal narrowing is greatest bilaterally at C5-C6, moderate to severe bilaterally.  The bones are demineralized.  The surrounding soft tissues are unremarkable.  The lung apices are clear.  IMPRESSION: Head CT:  No acute intracranial abnormality.  Fracture at the base of the left nasal bone may be acute or chronic.  Cervical spine CT:  No fracture or acute finding   07/15/12 - Cardiac Cath HEMODYNAMICS: Aortic pressure was 94/42; LV pressure was 102/3; LVEDP 11. There was no gradient between the left ventricle and aorta.  ANGIOGRAPHIC DATA: The left main coronary artery  is diffusely diseased, including an ostial 50% lesion.  The left anterior descending artery is occluded proximally. The distal vessel fills from the LIMA to LAD. There is an 90% stenosis in the mid LAD past the insertion of the LIMA to the LAD.  The left circumflex artery has a hazy, proximal 50-60% stenosis. There is a large OM 2 that fills by left to left collaterals, retrograde.  The right coronary artery is occluded in the proximal portion. There are bridging collaterals filling several RV marginal branches. The distal  RCA territory fills by a free RIMA graft.  The SVG to OM is occluded proximally. The previously placed stents are occluded.  The SVG to diagonal is occluded proximally. Of note, this was the vessel that was revascularized in 2010.  The RIMA to RCA appears widely patent. The posterior descending artery has mild disease. The posterior lateral artery has moderate stenosis proximally.  LEFT VENTRICULOGRAM: Left ventricular angiogram was done in the 30 RAO projection and revealed mid anterior wall hypokinesis with overall normal systolic function with an estimated ejection fraction of 50%. LVEDP was 11 mmHg.  IMPRESSIONS:  1. Moderate disease in the left main coronary artery. 2. Occluded proximal left anterior descending artery. LIMA to LAD is widely patent. 90% mid LAD lesion was successfully stented with a 2.5 x 15 vision bare metal stent. 3. Moderate disease in the proximal left circumflex. Large OM 2 is occluded and fills retrograde, by left to left collaterals. 4. Occluded native proximal right coronary artery. Patent free RIMA to distal RCA. Moderate disease in the posterior lateral artery. 5. Overall normal left ventricular systolic function. Mild, mid anterior hypokinesis. LVEDP 11 mmHg. Ejection fraction 50 %. RECOMMENDATION: He'll be watched in the ICU. Continue IV amiodarone until tomorrow morning. He requested a Foley catheter so this was placed. He believes he has prostatitis. Once the urinalysis is back, an antibiotic may need to be started. Continue beta blocker and statin. Post cath hydration.  07/18/12 - Echo Study Conclusions: - Left ventricle: The cavity size was normal. Wall thickness was normal. Systolic function was mildly to moderately reduced. The estimated ejection fraction was in the range of 40% to 45%.    History of Present Illness: 78 y.o. male w/ the above medical problems who presented to [facility] on 07/15/12 after falling at home with subsequent head  laceration. While in the ED he developed chest pain followed by VT requiring cardioversion and an EKG showing anterolateral ST elevation. Earlier that week he was diagnosed with prostatitis and was febrile at home with temp up to 103.   Hospital Course: In the ED, initial EKG was without acute ST/T changes. CXR was without acute cardiopulmonary abnormalities. Head/Cervical CT were without acute abnormalities. He received sutures/staples to his head laceration. He complained of chest pain for which a repeat EKG was obtained showing ST elevation in 1, aVL, V2-3. He developed sustained VT without a pulse requiring cardioversion and amiodarone with return of spontaneous circulation. He was taken emergently to the cath lab (see findings above) and treated with BMS to the mid LAD. He tolerated the procedure well without complications. He went to the ICU and was continued on IV amiodarone. Amiodarone was stopped and he remained without further VT. Echo showed EF 40-45%, mod LAE.   He was started on inhaled Dulera and Spiriva for treatment of his COPD. He was also diuresed with improvement in volume statis and shortness of breath. Weight 167lbs at discharge. He continued to  require supplemental O2 due to desaturation with minimal activity. This will be continued at dc with plans to follow up with pulmonology for PFTs and cardiopulmonary rehab.   Given recent diagnosis of prostatitis and patient report of fevers he was started on Cipro. UCx revealed E.coli and blood cultures grew gram negative rods for which he was switched to Zosyn and later Rocephin. After consultation by ID he was placed on Bactrim on 11/21 and is to complete a 14 day course (12/4). He is to follow up with his Urologist (Dr. Mena Goes).   He was seen and evaluated by Dr. Shirlee Latch who felt he was stable for discharge home with plans for follow up as scheduled below. He will need his sutures/staples removed between 11/23-26. Home health RN and PT were  arranged.   Discharge Vitals: Blood pressure 107/57, pulse 60, temperature 97.7 F (36.5 C), temperature source Axillary, resp. rate 12, height 5' 10.08" (1.78 m), weight 167 lb 8.8 oz (76 kg), SpO2 96.00%.  Labs: Component Value Date   WBC 5.6 07/18/2012   HGB 11.1* 07/18/2012   HCT 32.1* 07/18/2012   MCV 97.6 07/18/2012   PLT 179 07/18/2012    Lab 07/22/12 0513 07/18/12 0841  NA 136 --  K 4.2 --  CL 101 --  CO2 27 --  BUN 23 --  CREATININE 0.99 --  CALCIUM 9.8 --  PROT -- 6.2  BILITOT -- 0.2*  ALKPHOS -- 60  ALT -- 50  AST -- 78*  GLUCOSE 86 --     07/15/2012 08:05 07/15/2012 18:40 07/15/2012 23:50 07/16/2012 06:05  CK, MB  3.9  4.5 (H)  CK Total  259 (H)  407 (H)  Troponin I <0.30 1.22 (HH) 1.97 (HH) 1.94 (HH)     07/18/2012 09:21  Pro B Natriuretic peptide (BNP) 5879.0 (H)     07/16/2012 10:28  TSH 2.667     07/16/2012 10:28  Hemoglobin A1C 5.8 (H)    Discharge Medications     Medication List     As of 07/22/2012  2:33 PM    TAKE these medications         albuterol 108 (90 BASE) MCG/ACT inhaler   Commonly known as: PROVENTIL HFA;VENTOLIN HFA   Inhale 2 puffs into the lungs every 6 (six) hours as needed. For shortness of breath.      aspirin 81 MG chewable tablet   Chew 1 tablet (81 mg total) by mouth daily.      calcium-vitamin D 500-200 MG-UNIT per tablet   Commonly known as: OSCAL WITH D   Take 1 tablet by mouth 2 (two) times daily.      carbamazepine 300 MG 12 hr capsule   Commonly known as: CARBATROL   Take 300 mg by mouth 2 (two) times daily.      clopidogrel 75 MG tablet   Commonly known as: PLAVIX   Take 75 mg by mouth daily.      finasteride 5 MG tablet   Commonly known as: PROSCAR   Take 5 mg by mouth daily.      furosemide 40 MG tablet   Commonly known as: LASIX   Take 1 tablet (40 mg total) by mouth daily.      glucosamine-chondroitin 500-400 MG tablet   Take 1 tablet by mouth 2 (two) times daily.      isosorbide  mononitrate 60 MG 24 hr tablet   Commonly known as: IMDUR   Take 1.5 tablets (90 mg total) by mouth  daily.      metoprolol succinate 50 MG 24 hr tablet   Commonly known as: TOPROL-XL   75mg  daily. Take 1 1/2 tabs daily for a total of 75mg .      mometasone-formoterol 100-5 MCG/ACT Aero   Commonly known as: DULERA   Inhale 2 puffs into the lungs 2 (two) times daily.      nitroGLYCERIN 0.4 MG SL tablet   Commonly known as: NITROSTAT   Place 0.4 mg under the tongue every 5 (five) minutes as needed. For chest pain      potassium chloride SA 20 MEQ tablet   Commonly known as: K-DUR,KLOR-CON   Take 1 tablet (20 mEq total) by mouth daily.      predniSONE 10 MG tablet   Commonly known as: DELTASONE   Take 1 tablet (10 mg total) by mouth daily.   Start taking on: 07/23/2012      ranitidine 150 MG capsule   Commonly known as: ZANTAC   Take 150 mg by mouth daily.      rosuvastatin 40 MG tablet   Commonly known as: CRESTOR   Take 40 mg by mouth daily.      sertraline 100 MG tablet   Commonly known as: ZOLOFT   Take 150 mg by mouth daily.      sulfamethoxazole-trimethoprim 800-160 MG per tablet   Commonly known as: BACTRIM DS   Take 1 tablet by mouth every 12 (twelve) hours.      temazepam 15 MG capsule   Commonly known as: RESTORIL   Take 15 mg by mouth at bedtime as needed. For sleep      tiotropium 18 MCG inhalation capsule   Commonly known as: SPIRIVA   Place 1 capsule (18 mcg total) into inhaler and inhale daily.          Disposition   Discharge Orders    Future Appointments: Provider: Department: Dept Phone: Center:   11/06/2012 8:00 AM [facility] Main Office [place]) [phone] [facility]   11/08/2012 9:00 AM Cassell Clement, MD [facility] Main Office [place]) [phone] [facility]     Future Orders Please Complete By Expires   Amb Referral to Cardiac Rehabilitation      Diet - low sodium heart healthy      Increase  activity slowly      Discharge instructions      Comments:   * You will need to have your stitches/staples removed by 07/25/12. Please go to your primary care office or return to the ED to have these removed. Keep clean and dry.   * Please take your antibiotics (Bactrim) as prescribed for your UTI/prostatitis and follow up with your urologist.  * KEEP GROIN SITE CLEAN AND DRY. Call the office for any signs of bleedings, pus, swelling, increased pain, or any other concerns. * NO HEAVY LIFTING (>10lbs) X 3 WEEKS. * NO SEXUAL ACTIVITY X 3 WEEKS. * NO DRIVING X 1 WEEK. * NO SOAKING BATHS, HOT TUBS, POOLS, ETC., X 1 DAY     Follow-up Information    Follow up with Cassell Clement, MD. In 1 week. (Our office will call you with your appointment time and lab work (BMET))    Contact information:   [employer] [address] [place] [address] [phone]       Schedule an appointment as soon as possible for a visit with [facility].   Contact information:   [address]  [place] [address] [phone]      Schedule an appointment as soon as possible for a visit with TODD,JEFFREY Freida Busman, MD.   Contact information:   [address] [place] [address] [phone]       Schedule an appointment as soon as possible for a visit with Antony Haste, MD.   Contact information:   [address] [place] [address] [phone]           Outstanding Labs/Studies:  BMET PFTs  Duration of Discharge Encounter: Greater than 30 minutes including physician and PA time.  Signed, Devyn Sheerin PA-C 07/22/2012, 2:33 PM

## 2012-07-22 NOTE — Progress Notes (Signed)
Patient ID: [name], male   DOB: 03/31/1925, 78 y.o.   MRN: [id number]      Subjective  No chest pain.  Patient has been walking with his walker.  Still on oxygen.   Echo results: - Left ventricle: The cavity size was normal. Wall thickness was normal. Systolic function was mildly to moderately reduced. The estimated ejection fraction was in the range of 40% to 45%. - Left atrium: The atrium was moderately dilated.   Objective   Telemetry: NSR Physical Exam: Filed Vitals:   07/22/12 0716  BP: 124/58  Pulse: 60  Temp: 98.4 F (36.9 C)  Resp: 12   General: Well developed elderly WM in no acute distress. Head: Normocephalic, sclera non-icteric, no xanthomas, nares are without discharge. L semicircle scalp incision with staples, dried blood, no dehiscence or suppuration. Neck: No JVD. Lungs: Faint crackles at bases.  Breathing is unlabored. Heart: RRR S1 S2 with soft systolic ejection murmur at the base, no rub  or gallops.  Abdomen: Soft, non-tender, non-distended with normoactive bowel sounds. No hepatomegaly. No rebound/guarding. No obvious abdominal masses. Extremities: No clubbing or cyanosis. No edema.   Neuro: Alert and oriented X 3. Moves all extremities spontaneously. Psych:  Responds to questions appropriately with a normal affect.    Intake/Output Summary (Last 24 hours) at 07/22/12 1105 Last data filed at 07/22/12 0900  Gross per 24 hour  Intake    720 ml  Output   2075 ml  Net  -1355 ml    Inpatient Medications:     . antiseptic oral rinse  15 mL Mouth Rinse BID  . aspirin  81 mg Oral Daily  . atorvastatin  80 mg Oral q1800  . calcium-vitamin D  1 tablet Oral BID  . carbamazepine  300 mg Oral BID  . clopidogrel  75 mg Oral QAC breakfast  . docusate sodium  200 mg Oral QHS  . famotidine  20 mg Oral Daily  . finasteride  5 mg Oral Daily  . furosemide  40 mg Oral Daily  . ipratropium  0.5 mg Nebulization TID  . isosorbide mononitrate  90 mg Oral  Daily  . levalbuterol  0.63 mg Nebulization TID  . metoprolol succinate  75 mg Oral Daily  . polyethylene glycol  17 g Oral Daily  . potassium chloride SA  20 mEq Oral Q M,W,F  . potassium chloride  20 mEq Oral Daily  . predniSONE  10 mg Oral Once  . [COMPLETED] predniSONE  20 mg Oral Once  . sertraline  150 mg Oral Daily  . sulfamethoxazole-trimethoprim  1 tablet Oral Q12H    Labs:  [facility] 07/22/12 0513 07/21/12 0508  NA 136 136  K 4.2 3.9  CL 101 100  CO2 27 27  GLUCOSE 86 81  BUN 23 23  CREATININE 0.99 0.87  CALCIUM 9.8 9.7  MG 2.2 2.1  PHOS -- --   No results found for this basename: AST:2,ALT:2,ALKPHOS:2,BILITOT:2,PROT:2,ALBUMIN:2 in the last 72 hours No results found for this basename: WBC:2,NEUTROABS:2,HGB:2,HCT:2,MCV:2,PLT:2 in the last 72 hours No results found for this basename: HGBA1C in the last 72 hours  Radiology/Studies:  Echo as above  Ct Head Wo Contrast 07/15/2012  *RADIOLOGY REPORT*  Clinical Data: The patient fell hitting head.  Positive loss of consciousness with a frontal scalp laceration.  CT HEAD WITHOUT CONTRAST,CT CERVICAL SPINE WITHOUT CONTRAST  Technique:  Contiguous axial images were obtained from the base of the skull through the vertex without contrast.,Technique: Multidetector  CT imaging of the cervical spine was performed. Multiplanar CT image reconstructions were also generated.  Comparison: None.  Findings: Head CT:  The ventricles are normal in configuration. There is ventricular and sulcal enlargement reflecting moderate age related atrophy.  No parenchymal masses or mass effect.  Mild patchy white matter hypoattenuation is noted most consistent with chronic microvascular ischemic change.  There is no evidence of a recent infarct.  No intracranial hemorrhage.  No extra-axial masses or abnormal fluid collections.  No skull fracture.  The visualized sinuses and mastoid air cells are clear.  There is a fracture of the base of the left nasal bone.   This could be chronic or acute.  Cervical spine CT:  There is no fracture or spondylolisthesis. There is some resorption at the tip of the odontoid process that appears chronic.  There is loss of disc height with endplate osteophytes throughout most of the cervical spine with endplate spurring and facet spurring leading to varying degrees of neural foraminal narrowing.  Neural foraminal narrowing is greatest bilaterally at C5-C6, moderate to severe bilaterally.  The bones are demineralized.  The surrounding soft tissues are unremarkable.  The lung apices are clear.  IMPRESSION: Head CT:  No acute intracranial abnormality.  Fracture at the base of the left nasal bone may be acute or chronic.  Cervical spine CT:  No fracture or acute finding   Original Report Authenticated By: Amie Portland, M.D.    Ct Cervical Spine Wo Contrast 07/15/2012  *RADIOLOGY REPORT*  Clinical Data: The patient fell hitting head.  Positive loss of consciousness with a frontal scalp laceration.  CT HEAD WITHOUT CONTRAST,CT CERVICAL SPINE WITHOUT CONTRAST  Technique:  Contiguous axial images were obtained from the base of the skull through the vertex without contrast.,Technique: Multidetector CT imaging of the cervical spine was performed. Multiplanar CT image reconstructions were also generated.  Comparison: None.  Findings: Head CT:  The ventricles are normal in configuration. There is ventricular and sulcal enlargement reflecting moderate age related atrophy.  No parenchymal masses or mass effect.  Mild patchy white matter hypoattenuation is noted most consistent with chronic microvascular ischemic change.  There is no evidence of a recent infarct.  No intracranial hemorrhage.  No extra-axial masses or abnormal fluid collections.  No skull fracture.  The visualized sinuses and mastoid air cells are clear.  There is a fracture of the base of the left nasal bone.  This could be chronic or acute.  Cervical spine CT:  There is no fracture or  spondylolisthesis. There is some resorption at the tip of the odontoid process that appears chronic.  There is loss of disc height with endplate osteophytes throughout most of the cervical spine with endplate spurring and facet spurring leading to varying degrees of neural foraminal narrowing.  Neural foraminal narrowing is greatest bilaterally at C5-C6, moderate to severe bilaterally.  The bones are demineralized.  The surrounding soft tissues are unremarkable.  The lung apices are clear.  IMPRESSION: Head CT:  No acute intracranial abnormality.  Fracture at the base of the left nasal bone may be acute or chronic.  Cervical spine CT:  No fracture or acute finding   Original Report Authenticated By: Amie Portland, M.D.    Dg Chest Port 1 View 07/17/2012  *RADIOLOGY REPORT*  Clinical Data: 78 year old male with fever, wheezing, chronic obstructive pulmonary disease.  Myocardial infarction.  PORTABLE CHEST - 1 VIEW  Comparison: 07/16/2012 and earlier.  Findings: Portable semi upright AP view 0715 hours.  Resuscitation pad remain in place.  Stable sequelae of CABG.  Stable cardiac size and mediastinal contours.  No pneumothorax or pulmonary edema. Small right pleural effusion.  No consolidation.  Stable ventilation.  IMPRESSION: Small right pleural effusion.  No other acute cardiopulmonary abnormality.   Original Report Authenticated By: Erskine Speed, M.D.     Assessment and Plan  1. Acute anterolateral STEMI s/p BMS to LAD 07/15/12 (hx of CABG) - continue ASA, Lipitor, Plavix, Imdur, Toprol.  No further chest pain.  2. VT on admission s/p defib/amiodarone - quiescent, likely ischemic s/p revascularization. 3. Acute systolic CHF with EF 40-45% by echo 07/18/12 - Continue lasix 40 mg daily, volume status looks good. He is also on Toprol XL.  Will hold off on ACEI initiation today with SBP in the 90s-100s.  Will need to start as outpatient.  4. Escherichia coli bacteremia/SIRS on admission, likely secondary to  UTI/prostatitis: To have 2 weeks of Bactrim. 5. HTN - controlled. 6. Head laceration 2/2 fall s/p sutures/stables in ED 07/15/12 - will need removal of sutures 7-10 days (11/23-11/26). Will ask nursing to gently clean area today. 7. COPD - per PCCM, continue inhaled corticosteroid/Spiriva, plan outpatient PFTs, cardiopulm rehab, and home oxygen. 8. Anemia- stable. 9. Transaminitis - felt due to MI. 10. Hyperlipidemia - continue statin. 11. Patient stable for discharge today.  Will need home health RN, PT, and home oxygen.  Should followup with Dr. Patty Sermons within a week.  Meds: ASA 81, atorva 80, Plavix 75, Tegretol per home dose, Lasix 40 daily, KCl 20 daily, Spiriva daily, current inhaled corticosteroid, prednisone 10 mg daily x 2 more days then stop completely, Imdur 90, Toprol XL 75, Bactrim to complete 14 day course.   Marca Ancona 07/22/2012 11:10 AM

## 2012-07-24 ENCOUNTER — Telehealth: Payer: Self-pay | Admitting: Cardiology

## 2012-07-24 ENCOUNTER — Telehealth: Payer: Self-pay | Admitting: Pharmacist

## 2012-07-24 MED ORDER — MOMETASONE FURO-FORMOTEROL FUM 100-5 MCG/ACT IN AERO: 2.0000 | INHALATION_SPRAY | Freq: Two times a day (BID) | RESPIRATORY_TRACT | Status: DC

## 2012-07-24 MED ORDER — TIOTROPIUM BROMIDE MONOHYDRATE 18 MCG IN CAPS: 18.0000 ug | ORAL_CAPSULE | Freq: Every day | RESPIRATORY_TRACT | Status: DC

## 2012-07-24 NOTE — Telephone Encounter (Signed)
Pt aware samples left upfront for pick up. Nothing further was needed 

## 2012-07-24 NOTE — Telephone Encounter (Signed)
New Problem:    Called in wanting to know which inhaler the patient was supposed to have.  Please call back.

## 2012-07-24 NOTE — Telephone Encounter (Signed)
Ok to give him samples for a week pending ov

## 2012-07-24 NOTE — Telephone Encounter (Signed)
**  Transitional Care Patient**  I spoke with the pt and made him aware of all his upcoming appointments with Cardiology, Dr Tawanna Cooler and Dr Sherene Sires.  The only medications that the pt did not get after his discharge were Dulera and Spiriva.  The pt states that he cannot afford these medications and is going to discuss these inhalers with Dr Sherene Sires at his upcoming appointment on 07/26/12.  The pt states that he is doing okay since discharge from the hospital.   I will forward this message to Vernie Murders CMA with Dr Sherene Sires to see if she can provide this pt with samples of Dulera and Spiriva prior to his appointment on 07/26/12.

## 2012-07-24 NOTE — Telephone Encounter (Signed)
Question on whether or not patient should be taking Spiriva and Dulera both. Reviewed by  Dr. Patty Sermons and said ok for him to have, discuss further with pulmonologist at follow up appointment

## 2012-07-24 NOTE — Telephone Encounter (Signed)
Samples up front for the pt ATC the pt and line busy, [facility]

## 2012-07-24 NOTE — Telephone Encounter (Signed)
Message copied by Velda Shell on Mon Jul 24, 2012 11:21 AM ------      Message from: [place] Miners B      Created: Mon Jul 24, 2012 10:09 AM      Regarding: 7 day transition of care appointment       I have scheduled the patient to be seen on 07/25/12 @ 10:30am by Jacolyn Reedy and to have a Bmet per After Hours Voicemail from [place].

## 2012-07-25 ENCOUNTER — Ambulatory Visit (INDEPENDENT_AMBULATORY_CARE_PROVIDER_SITE_OTHER): Payer: Medicare Other | Admitting: *Deleted

## 2012-07-25 ENCOUNTER — Encounter: Payer: Self-pay | Admitting: Physician Assistant

## 2012-07-25 ENCOUNTER — Encounter: Payer: Self-pay | Admitting: Family Medicine

## 2012-07-25 ENCOUNTER — Ambulatory Visit (INDEPENDENT_AMBULATORY_CARE_PROVIDER_SITE_OTHER): Payer: Medicare Other | Admitting: Physician Assistant

## 2012-07-25 ENCOUNTER — Ambulatory Visit (INDEPENDENT_AMBULATORY_CARE_PROVIDER_SITE_OTHER): Payer: Medicare Other | Admitting: Family Medicine

## 2012-07-25 ENCOUNTER — Telehealth: Payer: Self-pay | Admitting: *Deleted

## 2012-07-25 VITALS — BP 120/70 | Temp 97.9°F | Wt 167.0 lb

## 2012-07-25 VITALS — BP 110/58 | HR 60 | Ht 70.0 in | Wt 165.0 lb

## 2012-07-25 DIAGNOSIS — I498 Other specified cardiac arrhythmias: Secondary | ICD-10-CM

## 2012-07-25 DIAGNOSIS — I5021 Acute systolic (congestive) heart failure: Secondary | ICD-10-CM

## 2012-07-25 DIAGNOSIS — I509 Heart failure, unspecified: Secondary | ICD-10-CM

## 2012-07-25 DIAGNOSIS — S0190XA Unspecified open wound of unspecified part of head, initial encounter: Secondary | ICD-10-CM

## 2012-07-25 DIAGNOSIS — R9431 Abnormal electrocardiogram [ECG] [EKG]: Secondary | ICD-10-CM

## 2012-07-25 DIAGNOSIS — I259 Chronic ischemic heart disease, unspecified: Secondary | ICD-10-CM

## 2012-07-25 DIAGNOSIS — S0191XA Laceration without foreign body of unspecified part of head, initial encounter: Secondary | ICD-10-CM

## 2012-07-25 DIAGNOSIS — E785 Hyperlipidemia, unspecified: Secondary | ICD-10-CM

## 2012-07-25 DIAGNOSIS — R001 Bradycardia, unspecified: Secondary | ICD-10-CM | POA: Insufficient documentation

## 2012-07-25 DIAGNOSIS — I447 Left bundle-branch block, unspecified: Secondary | ICD-10-CM

## 2012-07-25 LAB — BASIC METABOLIC PANEL
BUN: 21 mg/dL (ref 6–23)
CO2: 24 mEq/L (ref 19–32)
Chloride: 102 mEq/L (ref 96–112)
Glucose, Bld: 98 mg/dL (ref 70–99)
Potassium: 4.1 mEq/L (ref 3.5–5.1)

## 2012-07-25 MED ORDER — METOPROLOL SUCCINATE ER 25 MG PO TB24: 25.0000 mg | ORAL_TABLET | Freq: Every day | ORAL | Status: DC

## 2012-07-25 NOTE — Patient Instructions (Addendum)
Your physician recommends that you schedule a follow-up appointment early next week with Dr Patty Sermons Your physician has recommended you make the following change in your medication: HOLD Metoprolol tonight and then RESUME at 25 mg daily Your physician recommends that you return for lab work in: WE WILL RESCHEDULE FASTING LABS (LIPID AND LIVER PROFILE)

## 2012-07-25 NOTE — Progress Notes (Signed)
  Subjective:    Patient ID: [name], male    DOB: 23-Dec-1924, 78 y.o.   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 It was closed with 14 staples. He's in today for followup and removal.   Review of Systems Review of systems otherwise negative    Objective:   Physical Exam Well-developed well-nourished male O2 dependent with staples in his scalp which were removed without complication       Assessment & Plan:  Staples removed no charge

## 2012-07-25 NOTE — Assessment & Plan Note (Signed)
chronic

## 2012-07-25 NOTE — Progress Notes (Signed)
HPI:  78 y.o. male who presented to [facility] on 07/15/12 after falling at home with subsequent head laceration. While in the ED he developed chest pain followed by VT requiring cardioversion and an EKG showing anterolateral ST elevation. Earlier that week he was diagnosed with prostatitis and was febrile at home with temp up to 103.     In the ED, initial EKG was without acute ST/T changes. CXR was without acute cardiopulmonary abnormalities. Head/Cervical CT were without acute abnormalities. He received sutures/staples to his head laceration. He complained of chest pain for which a repeat EKG was obtained showing ST elevation in 1, aVL, V2-3. He developed sustained VT without a pulse requiring cardioversion and amiodarone with return of spontaneous circulation. He was taken emergently to the cath lab (see findings below) and treated with BMS to the mid LAD. He tolerated the procedure well without complications. He went to the ICU and was continued on IV amiodarone. Amiodarone was stopped and he remained without further VT. Echo showed EF 40-45%, mod LAE.   He was started on inhaled Dulera and Spiriva for treatment of his COPD. He was also diuresed with improvement in volume statis and shortness of breath. Weight 167lbs at discharge. He continued to require supplemental O2 due to desaturation with minimal activity. This will be continued at dc with plans to follow up with pulmonology for PFTs and cardiopulmonary rehab.    Given recent diagnosis of prostatitis and patient report of fevers he was started on Cipro. UCx revealed E.coli and blood cultures grew gram negative rods for which he was switched to Zosyn and later Rocephin. After consultation by ID he was placed on Bactrim on 11/21 and is to complete a 14 day course (12/4). He is to follow up with his Urologist (Dr. Mena Goes).   The patient comes in today in a wheelchair on home oxygen. He says he has a little fullness in his chest if he  rushes around but overall he is doing well without chest pain. He has some dizziness which is not unusual for him. He is due to see the pulmonologist tomorrow and urologist he will schedule. He denies any significant dyspnea on exertion or orthopnea. He is using oxygen 24 hours a day. He denies any syncope or palpitations.  No Known Allergies  Current Outpatient Prescriptions on File Prior to Visit: albuterol (PROVENTIL HFA;VENTOLIN HFA) 108 (90 BASE) MCG/ACT inhaler, Inhale 2 puffs into the lungs every 6 (six) hours as needed. For shortness of breath. , Disp: , Rfl:  aspirin 81 MG chewable tablet, Chew 1 tablet (81 mg total) by mouth daily., Disp: , Rfl:  calcium-vitamin D (OSCAL WITH D 500-200) 500-200 MG-UNIT per tablet, Take 1 tablet by mouth 2 (two) times daily.  , Disp: , Rfl:  carbamazepine (CARBATROL) 300 MG 12 hr capsule, Take 300 mg by mouth 2 (two) times daily.  , Disp: , Rfl:  clopidogrel (PLAVIX) 75 MG tablet, Take 75 mg by mouth daily.  , Disp: , Rfl:  finasteride (PROSCAR) 5 MG tablet, Take 5 mg by mouth daily.  , Disp: , Rfl:  furosemide (LASIX) 40 MG tablet, Take 1 tablet (40 mg total) by mouth daily., Disp: 30 tablet, Rfl: 6 glucosamine-chondroitin 500-400 MG tablet, Take 1 tablet by mouth 2 (two) times daily., Disp: , Rfl:  isosorbide mononitrate (IMDUR) 60 MG 24 hr tablet, Take 1.5 tablets (90 mg total) by mouth daily., Disp: 60 tablet, Rfl: 6 metoprolol succinate (TOPROL-XL) 50 MG 24 hr tablet,  75mg  daily. Take 1 1/2 tabs daily for a total of 75mg ., Disp: 60 tablet, Rfl: 6 mometasone-formoterol (DULERA) 100-5 MCG/ACT AERO, Inhale 2 puffs into the lungs 2 (two) times daily., Disp: 1 Inhaler, Rfl: 0 nitroGLYCERIN (NITROSTAT) 0.4 MG SL tablet, Place 0.4 mg under the tongue every 5 (five) minutes as needed. For chest pain, Disp: , Rfl:  potassium chloride SA (K-DUR,KLOR-CON) 20 MEQ tablet, Take 1 tablet (20 mEq total) by mouth daily., Disp: 30 tablet, Rfl: 6 predniSONE (DELTASONE)  10 MG tablet, Take 1 tablet (10 mg total) by mouth daily., Disp: 2 tablet, Rfl: 0 ranitidine (ZANTAC) 150 MG capsule, Take 150 mg by mouth daily., Disp: , Rfl:  rosuvastatin (CRESTOR) 40 MG tablet, Take 40 mg by mouth daily., Disp: , Rfl:  sertraline (ZOLOFT) 100 MG tablet, Take 150 mg by mouth daily. , Disp: , Rfl:  sulfamethoxazole-trimethoprim (BACTRIM DS) 800-160 MG per tablet, Take 1 tablet by mouth every 12 (twelve) hours., Disp: 23 tablet, Rfl: 0 temazepam (RESTORIL) 15 MG capsule, Take 15 mg by mouth at bedtime as needed. For sleep, Disp: , Rfl:  tiotropium (SPIRIVA HANDIHALER) 18 MCG inhalation capsule, Place 1 capsule (18 mcg total) into inhaler and inhale daily., Disp: 10 capsule, Rfl: 0    Past Medical History:   Systolic CHF                                                   Comment:EF 40-45% by echo 07/18/12   Dyslipidemia (high LDL; low HDL)                             Hypertension                                                 Coronary artery disease                                        Comment:s/p CABG; Anterolateral STEMI 06/2012 s/p BMS               to mid LAD    Heart murmur                                                 Peripheral vascular disease                                    Comment:thrombus- in leg- many yrs. ago- ?R- coumadin               x1 mth   Pneumonia                                                      Comment:hosp.  long time ago    COPD (chronic obstructive pulmonary disease)                   Comment:dc'd on home O2 06/2012   Blood transfusion                                              Comment:post CABG   Hepatitis                                                      Comment:hepatitis- long time ago, occupational               contamination    GERD (gastroesophageal reflux disease)                       Cancer                                                         Comment:prostate - Ca- radiation therapy- 2002   Peripheral neuropathy                                         Hiatal hernia                                                Arthritis                                                      Comment:hnp, lumbar, ankle (foot drop-R), wears a               splint, arthritis - "all over", low bone               density    Depression                                                     Comment:uses Zoloft    VT (ventricular tachycardia)                    06/2012        Comment:in the setting of STEMI   Bacteremia                                      06/2012        Comment:in the setting of UTI/Prostatits, Blood Cx  E.Coli tx w/ Bactrim   HTN (hypertension)                                           Anemia                                                       Glucose intolerance (impaired glucose toleranc* 06/2012        Comment:A1C 5.8  Past Surgical History:   CORONARY ARTERY BYPASS GRAFT                    1986 and *   PTCA                                            2008         CARDIAC CATHETERIZATION                                        Comment:2010- last cath, two stents at that time   EYE SURGERY                                                    Comment:cataracts - bilateral , IOL   HERNIA REPAIR                                                  Comment:bilateral inguiinal repair    TONSILLECTOMY                                                FRACTURE SURGERY                                               Comment:heel- crushed -2002,  (hardware)    JOINT REPLACEMENT                                              Comment:R knee- arthroscopic, x2, R great toe- 2               surgeries- /w hardware & thenlater fused by Dr.              Lajoyce Corners     APPENDECTOMY  RHINOPLASTY                                                    Comment:as a teen, followed by same procedure at 1970's   LUMBAR LAMINECTOMY/DECOMPRESSION MICRODISCECTO* 08/10/2011      Comment:Procedure: LUMBAR LAMINECTOMY/DECOMPRESSION               MICRODISCECTOMY;  Surgeon: Temple Pacini;                Location: [facility];  Service: Neurosurgery;              Laterality: Right;  RIGHT Lumbar five-sacral               one LAMINECTOMY, MICRODISCECTOMY   LUMBAR WOUND DEBRIDEMENT                        08/22/2011     Comment:Procedure: LUMBAR WOUND DEBRIDEMENT;  Surgeon:               Tia Alert;  Location: [facility];                Service: Neurosurgery;  Laterality: N/A;                Repair of CSF Leak requiring laminectomy  Review of patient's family history indicates:   Heart attack                   Mother                   Addison's disease              Father                   Heart attack                   Sister                   Anesthesia problems            Neg Hx                   Hypotension                    Neg Hx                   Malignant hyperthermia         Neg Hx                   Pseudochol deficiency          Neg Hx                   Social History   Marital Status: Married             Spouse Name:                      Years of Education:                 Number of children:             Occupational History   None on file  Social History Main Topics  Smoking Status: Never Smoker                     Smokeless Status: Not on file                      Alcohol Use: No             Drug Use: No             Sexual Activity:                    Other Topics            Concern   None on file  Social History Narrative   None on file    ROS:See history of present illness otherwise negative   PHYSICAL EXAM: Well-nournished, in no acute distress, in wheelchair on O2. Neck: No JVD, HJR, Bruit, or thyroid enlargement  Lungs: Decreased breath sounds but No tachypnea, clear without wheezing, rales, or rhonchi  Cardiovascular: slow and irregular, PMI not displaced, 2/6 systolic murmur at the left sternal border and apex,no bruit,  thrill, or heave.  Abdomen: BS normal. Soft without organomegaly, masses, lesions or tenderness.  Extremities:right groin hematoma or hemorrhage the catheter site, lower extremities without cyanosis, clubbing or edema. Good distal pulses bilateral  SKin: Warm, no lesions or rashes   Musculoskeletal: No deformities  Neuro: no focal signs  BP 110/58  Pulse 60  Ht 5\' 10"  (1.778 m)  Wt 165 lb (74.844 kg)  BMI 23.68 kg/m2  SpO2 96%    MVH:QION junctional rhythm with left bundle branch block. This was reviewed with Dr. Patty Sermons who interpreted the EKG  Cardiac cath:07/15/12: PROCEDURE:  Left heart catheterization with selective coronary angiography, left ventriculogram. Arterial conduit angiography, vein graft angiography  INDICATIONS:  Cardiac arrest; NSTEMI  The risks, benefits, and details of the procedure were explained to the patient.  The patient verbalized understanding and wanted to proceed.  Informed written consent was obtained.  PROCEDURE TECHNIQUE:  After Xylocaine anesthesia a 28F sheath was placed in the right femoral artery with a single anterior needle wall stick.   Left coronary angiography was done using a Judkins L4 guide catheter.  Right coronary angiography was done using a Judkins R4 guide catheter.  Left ventriculography was done using a pigtail catheter.      CONTRAST:  Total of 160 cc.  COMPLICATIONS:  None.    HEMODYNAMICS:  Aortic pressure was 94/42; LV pressure was 102/3; LVEDP 11.  There was no gradient between the left ventricle and aorta.    ANGIOGRAPHIC DATA:   The left main coronary artery is diffusely diseased, including an ostial 50% lesion.  The left anterior descending artery is occluded proximally.  The distal vessel fills from the LIMA to LAD.  There is an 90% stenosis in the mid LAD past the insertion of the LIMA to the LAD.  The left circumflex artery has a hazy, proximal 50-60% stenosis.  There is a large OM 2 that fills by left to left  collaterals, retrograde.  The right coronary artery is occluded in the proximal portion.  There are bridging collaterals filling several RV marginal branches.  The distal RCA territory fills by a free RIMA graft.  The SVG to OM is occluded proximally.  The previously placed stents are occluded.    The SVG to diagonal is occluded proximally.  Of note, this was the vessel that was revascularized in 2010.  The RIMA to RCA appears widely patent.  The posterior descending artery has mild disease.  The posterior lateral artery has moderate stenosis proximally.  LEFT VENTRICULOGRAM:  Left ventricular angiogram was done in the 30 RAO projection and revealed mid anterior wall hypokinesis with overall normal systolic function with an estimated ejection fraction of 50%.  LVEDP was 11 mmHg.  PCI NARRATIVE: Angiomax was used for anticoagulation.  An ACT was used to ensure therapeutic result.  An ALT guide was used to engage the SVG to diagonal.  It was thought that this was the culprit initially.  We tried a Designer, industrial/product and a Nature conservation officer.  Both wires were unable to cross the occlusion, as it appeared chronically occluded.  An IMA guide was used to engage the LIMA.  A pro-water wire was placed through the LIMA and across the stenosis in the mid LAD.  A 2.0 x 12 balloon was used to predilate the lesion.  This is inflated 12 atmospheres.  A 2.5 x 15 vision bare metal stent was deployed at 10 atmospheres.  The midportion of the stent was postdilated with a 2.5 x 8 noncompliant balloon, inflated to 14 atmospheres.  There is no residual stenosis.  Several doses of intracoronary nitroglycerin were used to treat vasospasm in the distal LAD.  TIMI 3 flow was maintained throughout.  A bare metal stent was used to prevent the need for prolonged dual antiplatelet therapy, given his age and comorbidities.    IMPRESSIONS:    1. Moderate disease in the left main coronary artery.  2. Occluded proximal left anterior  descending artery.  LIMA to LAD is widely patent.  90% mid LAD lesion was successfully stented with a 2.5 x 15 vision bare metal stent.  3. Moderate disease in the proximal left circumflex.  Large OM 2 is occluded and fills retrograde, by left to left collaterals.  4. Occluded native proximal right coronary artery.  Patent free RIMA to distal RCA.  Moderate disease in the posterior lateral artery.  5. Overall normal left ventricular systolic function.  Mild, mid anterior hypokinesis.  LVEDP 11 mmHg.  Ejection fraction 50 %.  RECOMMENDATION:  He'll be watched in the ICU.  Continue IV amiodarone until tomorrow morning.  He requested a Foley catheter so this was placed.  He believes he has prostatitis.  Once the urinalysis is back, an antibiotic may need to be started.  Continue beta blocker and statin.  Post cath hydration.

## 2012-07-25 NOTE — Assessment & Plan Note (Signed)
Ejection fraction was 40-45% by echo on 07/18/12. He is well compensated today. ACE Inhibitor was initiated due to low blood pressure and dizziness. We'll need to continue to reassess.

## 2012-07-25 NOTE — Telephone Encounter (Signed)
Message copied by Burnell Blanks on Tue Jul 25, 2012  6:31 PM ------      Message from: Cassell Clement      Created: Tue Jul 25, 2012  5:10 PM       Please report.  Blood work is normal  Potassium is normal.  Kidney function is stable with normal creatinine of 1.2

## 2012-07-25 NOTE — Telephone Encounter (Signed)
Advised patient of lab results  

## 2012-07-25 NOTE — Assessment & Plan Note (Signed)
Patient suffered an acute anterolateral ST elevation MI treated with bare-metal stent to the mid LAD on 07/15/12. This was complicated by ventricular tachycardia status post defibrillation and amiodarone. Amiodarone was stopped and he was treated with beta blocker. Now he is in a slow junctional bradycardia with left bundle branch block. I reviewed his EKGs with Dr. Patty Sermons who recommends decreasing his Toprol from 75 mg daily to 25 mg daily after holding it for one day. We will also check electrolytes. He will be seen in the office early next week and is advised to call if he has any symptoms

## 2012-07-26 ENCOUNTER — Ambulatory Visit (INDEPENDENT_AMBULATORY_CARE_PROVIDER_SITE_OTHER): Payer: Medicare Other | Admitting: Internal Medicine

## 2012-07-26 ENCOUNTER — Encounter: Payer: Self-pay | Admitting: Internal Medicine

## 2012-07-26 VITALS — BP 104/58 | HR 56 | Temp 97.3°F | Ht 65.0 in | Wt 167.2 lb

## 2012-07-26 DIAGNOSIS — J449 Chronic obstructive pulmonary disease, unspecified: Secondary | ICD-10-CM

## 2012-07-26 DIAGNOSIS — R0902 Hypoxemia: Secondary | ICD-10-CM

## 2012-07-26 DIAGNOSIS — J4489 Other specified chronic obstructive pulmonary disease: Secondary | ICD-10-CM

## 2012-07-26 NOTE — Assessment & Plan Note (Signed)
Placed on 02 at discharge from [facility] 07/22/12 @ 2lpm 24/7 - 07/26/2012  Walked 2 lpm x 2 laps @ 185 ft each stopped due to weak> sob and  no desat  Adequate control on present rx, reviewed

## 2012-07-26 NOTE — Patient Instructions (Signed)
Stop spiriva Dulera 200 Take 2 puffs first thing in am and then another 2 puffs about 12 hours later until return Stay on 02 2lpm 24 h per day Only use atrovent if needed up to every 4 hours for difficulty breathing/wheezing  Work on inhaler technique:  relax and gently blow all the way out then take a nice smooth deep breath back in, triggering the inhaler at same time you start breathing in.  Hold for up to 5 seconds if you can.  Rinse and gargle with water when done   If your mouth or throat starts to bother you,   I suggest you time the inhaler to your dental care and after using the inhaler(s) brush teeth and tongue with a baking soda containing toothpaste and when you rinse this out, gargle with it first to see if this helps your mouth and throat.     Please schedule a follow up office visit in 6 weeks, call sooner if needed with pfts and cxr

## 2012-07-26 NOTE — Progress Notes (Signed)
Subjective:     Patient ID: [name], male   DOB: 06-22-1925   MRN: [id number]  HPI  78 yowm never smoker carries dx of copd since around 2010  MRN: [id number], DOB/AGE: 01/01/1925 78 y.o.  Primary MD: Evette Georges, MD Primary Cardiologist: Dr. Patty Sermons  Admit date: 07/15/2012  D/C date: 07/22/2012   Primary Discharge Diagnoses:  1. Acute anterolateral STEMI  - s/p BMS to mid LAD 07/15/12  2. Ventricular Tachycardia  - s/p defib/amio, likely ischemic, quiescent s/p revascularization.  3. Acute systolic CHF  - EF 40-45% by echo 07/18/12  - ACEI not initiated due to soft BP, Will need to start as outpatient  4. Escherichia coli bacteremia/SIRS  - likely secondary to UTI/prostatitis  - To complete 2 weeks of Bactrim (08/02/12)  - F/u w/ Urologist Dr. Mena Goes  5. Hypertension  6. Head laceration 2/2 fall  s/p sutures/stables in ED 07/15/12 - will need removal of sutures 7-10 days (11/23-11/26)  7. COPD  - continue inhaled corticosteroid/Spiriva, plan outpatient PFTs, cardiopulm rehab, and home oxygen.  8. Anemia  9. Transaminitis  - felt due to MI.  10. Hyperlipidemia  11. Glucose Intolerance  - A1c 5.8, f/u w/ PCP  12. Hypokalemia, resolved  07/26/2012 post hosp f/u newly on 02 2lpm 24 h per day.  Main issue is weak.  Previous to hosp atrovent prn > discharge on dulera and spiriva cc doe x 50 ft even on 02. No obvious daytime variabilty or assoc chronic cough or cp or chest tightness, subjective wheeze overt sinus or hb symptoms. No unusual exp hx or h/o childhood pna/ asthma or premature birth to his knowledge.   Sleeping ok without nocturnal  or early am exacerbation  of respiratory  c/o's or need for noct saba. Also denies any obvious fluctuation of symptoms with weather or environmental changes or other aggravating or alleviating factors except as outlined above  ROS  The following are not active complaints unless bolded sore throat, dysphagia, dental problems,  itching, sneezing,  nasal congestion or excess/ purulent secretions, ear ache,   fever, chills, sweats, unintended wt loss, pleuritic or exertional cp, hemoptysis,  orthopnea pnd or leg swelling, presyncope, palpitations, heartburn, abdominal pain, anorexia, nausea, vomiting, diarrhea  or change in bowel or urinary habits, change in stools or urine, dysuria,hematuria,  rash, arthralgias, visual complaints, headache, numbness weakness or ataxia or problems with walking or coordination,  change in mood/affect or memory.      Review of Systems     Objective:   Physical Exam     Wt Readings from Last 3 Encounters:  07/26/12 167 lb 3.2 oz (75.841 kg)  07/25/12 167 lb (75.751 kg)  07/25/12 165 lb (74.844 kg)    HEENT: nl dentition, turbinates, and orophanx. Nl external ear canals without cough reflex   NECK :  without JVD/Nodes/TM/ nl carotid upstrokes bilaterally   LUNGS: no acc muscle use, clear to A and P bilaterally without cough on insp or exp maneuvers   CV:  RRR  no s3 or murmur or increase in P2, no edema   ABD:  soft and nontender with nl excursion in the supine position. No bruits or organomegaly, bowel sounds nl  MS:  warm without deformities, calf tenderness, cyanosis or clubbing  SKIN: warm and dry without lesions    NEURO:  alert, approp, no deficits   07/01/12 Improved aeration. Small pleural effusions remain.   Assessment:  Plan:

## 2012-07-26 NOTE — Assessment & Plan Note (Addendum)
-   Spirometry 07/26/2012  1.77 (69%) ratio 65    - HFA 50% 07/26/2012   Doubt copd since never smoked - more likely chronic asthma ? exac by beta blocker use in past vs cardiac asthma component and that's why he feels atrovent helps but on the other hand feels dulera better now so should continue until bring back for full pft's before and after B2 to sort out.  The proper method of use, as well as anticipated side effects, of a metered-dose inhaler are discussed and demonstrated to the patient. Improved effectiveness after extensive coaching during this visit to a level of approximately  50% so since feels dulera helped rec 200 2bid and stop spiriva and just use prn atrovent    Each maintenance medication was reviewed in detail including most importantly the difference between maintenance and as needed and under what circumstances the prns are to be used.  Please see instructions for details which were reviewed in writing and the patient given a copy.

## 2012-08-01 ENCOUNTER — Other Ambulatory Visit: Payer: Self-pay | Admitting: *Deleted

## 2012-08-01 ENCOUNTER — Other Ambulatory Visit (INDEPENDENT_AMBULATORY_CARE_PROVIDER_SITE_OTHER): Payer: Medicare Other

## 2012-08-01 ENCOUNTER — Ambulatory Visit (INDEPENDENT_AMBULATORY_CARE_PROVIDER_SITE_OTHER): Payer: Medicare Other | Admitting: Cardiology

## 2012-08-01 ENCOUNTER — Encounter: Payer: Self-pay | Admitting: Cardiology

## 2012-08-01 VITALS — BP 134/63 | HR 67 | Ht 70.0 in | Wt 165.0 lb

## 2012-08-01 DIAGNOSIS — I2109 ST elevation (STEMI) myocardial infarction involving other coronary artery of anterior wall: Secondary | ICD-10-CM

## 2012-08-01 DIAGNOSIS — A498 Other bacterial infections of unspecified site: Secondary | ICD-10-CM

## 2012-08-01 DIAGNOSIS — I2510 Atherosclerotic heart disease of native coronary artery without angina pectoris: Secondary | ICD-10-CM

## 2012-08-01 DIAGNOSIS — I10 Essential (primary) hypertension: Secondary | ICD-10-CM

## 2012-08-01 DIAGNOSIS — J449 Chronic obstructive pulmonary disease, unspecified: Secondary | ICD-10-CM

## 2012-08-01 DIAGNOSIS — I447 Left bundle-branch block, unspecified: Secondary | ICD-10-CM

## 2012-08-01 DIAGNOSIS — R42 Dizziness and giddiness: Secondary | ICD-10-CM

## 2012-08-01 DIAGNOSIS — N390 Urinary tract infection, site not specified: Secondary | ICD-10-CM

## 2012-08-01 DIAGNOSIS — I259 Chronic ischemic heart disease, unspecified: Secondary | ICD-10-CM

## 2012-08-01 DIAGNOSIS — B9620 Unspecified Escherichia coli [E. coli] as the cause of diseases classified elsewhere: Secondary | ICD-10-CM

## 2012-08-01 LAB — BASIC METABOLIC PANEL
BUN: 15 mg/dL (ref 6–23)
Calcium: 9.2 mg/dL (ref 8.4–10.5)
GFR: 61.69 mL/min (ref >60.00)
Potassium: 4.1 mEq/L (ref 3.5–5.1)
Sodium: 137 mEq/L (ref 135–145)

## 2012-08-01 LAB — HEPATIC FUNCTION PANEL
ALT: 36 U/L (ref 0–53)
AST: 41 U/L — ABNORMAL HIGH (ref 0–37)
Bilirubin, Direct: 0.1 mg/dL (ref 0.0–0.3)
Total Protein: 7 g/dL (ref 6.0–8.3)

## 2012-08-01 NOTE — Assessment & Plan Note (Signed)
The patient remains on Bactrim DS one tablet twice a day for urinary tract infection.  He will finish Bactrim on December 5 and he has an appointment to see his urologist Dr. Mena Goes on December 6.  He is not currently having any UTI symptoms

## 2012-08-01 NOTE — Progress Notes (Signed)
Quick Note:  Please report to patient. The recent labs are stable. Continue same medication and careful diet. Potassium is normal. Liver functions are improving ______

## 2012-08-01 NOTE — Assessment & Plan Note (Signed)
When the patient was seen in the office last week he had marked bradycardia.  He was on Toprol 75 mg daily at the time and his dose was decreased down to 25 mg daily.  His dizziness has cleared and his electrocardiogram today shows normal sinus rhythm at 64 per minute with a known left bundle branch block

## 2012-08-01 NOTE — Assessment & Plan Note (Signed)
The patient is presently on dulera 2 puffs twice a day.  He hopes to be able to obtain this from the [facility] tomorrow at his visit there.  In about 6 weeks Dr. Sherene Sires plans to do pulmonary function tests.

## 2012-08-01 NOTE — Assessment & Plan Note (Signed)
The patient has not had to take any sublingual nitroglycerin for angina pectoris since his last visit

## 2012-08-01 NOTE — Patient Instructions (Addendum)
Your physician recommends that you continue on your current medications as directed. Please refer to the Current Medication list given to you today.  Your physician recommends that you schedule a follow-up appointment in: 4 weeks ov/bmet

## 2012-08-01 NOTE — Progress Notes (Signed)
[name] Date of Birth:  Jan 25, 1925 [facility] [address] [place], [place]  [address] [phone]         Fax   [phone]  History of Present Illness: 78 y.o. male who presented to [facility] on 07/15/12 after falling at home with subsequent head laceration. While in the ED he developed chest pain followed by VT requiring cardioversion and an EKG showing anterolateral ST elevation. Earlier that week he was diagnosed with prostatitis and was febrile at home with temp up to 103. [name] He complained of chest pain for which a repeat EKG was obtained showing ST elevation in 1, aVL, V2-3. He developed sustained VT without a pulse requiring cardioversion and amiodarone with return of spontaneous circulation. He was taken emergently to the cath lab  and treated with BMS to the mid LAD. He tolerated the procedure well without complications. He went to the ICU and was continued on IV amiodarone. Amiodarone was stopped and he remained without further VT. Echo showed EF 40-45%, mod LAE.  He has a history of extensive ischemic heart disease. He had coronary artery bypass graft surgery in 1986. He had a redo CABG in 1993. He had PTCA with stent in October 2008. He had catheter with a drug-eluting stent in February 2010. He had a small non-Q MI in August 2010. His last nuclear stress test was 10/27/09 showing a moderate area of reversible ischemia in the inferolateral wall with an ejection fraction of 64%.    Current Outpatient Prescriptions  Medication Sig Dispense Refill  . aspirin 325 MG tablet Take 325 mg by mouth daily.      . calcium-vitamin D (OSCAL WITH D 500-200) 500-200 MG-UNIT per tablet Take 1 tablet by mouth 2 (two) times daily.        . carbamazepine (CARBATROL) 300 MG 12 hr capsule Take 300 mg by mouth 2 (two) times daily.        . clopidogrel (PLAVIX) 75 MG tablet Take 75 mg by mouth daily.        . finasteride (PROSCAR) 5 MG tablet Take 5 mg by mouth  daily.        . furosemide (LASIX) 40 MG tablet Take 1 tablet (40 mg total) by mouth daily.  30 tablet  6  . ipratropium (ATROVENT HFA) 17 MCG/ACT inhaler Inhale 2 puffs into the lungs as needed.      . isosorbide mononitrate (IMDUR) 60 MG 24 hr tablet Take 1.5 tablets (90 mg total) by mouth daily.  60 tablet  6  . metoprolol succinate (TOPROL-XL) 25 MG 24 hr tablet Take 1 tablet (25 mg total) by mouth daily.  30 tablet  6  . mometasone-formoterol (DULERA) 100-5 MCG/ACT AERO Inhale 2 puffs into the lungs 2 (two) times daily.      . nitroGLYCERIN (NITROSTAT) 0.4 MG SL tablet Place 0.4 mg under the tongue every 5 (five) minutes as needed. For chest pain      . potassium chloride SA (K-DUR,KLOR-CON) 20 MEQ tablet Take 1 tablet (20 mEq total) by mouth daily.  30 tablet  6  . ranitidine (ZANTAC) 150 MG capsule Take 150 mg by mouth daily.      . rosuvastatin (CRESTOR) 40 MG tablet Take 40 mg by mouth daily.      . sertraline (ZOLOFT) 100 MG tablet Take 150 mg by mouth daily.       [name] sulfamethoxazole-trimethoprim (BACTRIM DS) 800-160 MG per tablet Take 1 tablet by  mouth every 12 (twelve) hours.  23 tablet  0  . temazepam (RESTORIL) 15 MG capsule Take 15 mg by mouth at bedtime as needed. For sleep        No Known Allergies  Patient Active Problem List  Diagnosis  . HYPERLIPIDEMIA  . PERIPHERAL NEUROPATHY  . HYPERTENSION  . Cor Athrscl-Uns Vessel  . GERD  . HERNIA  . PSORIASIS  . OSTEOARTHRITIS  . OSTEOPOROSIS  . PROSTATE CANCER, HX OF  . Ischemic heart disease  . Myocardial infarction  . Acute systolic congestive heart failure, NYHA class 2-EF 40-45%  . Hypoxia  . Chronic asthma  . Peripheral neuropathy  . Dizziness  . Dyslipidemia (high LDL; low HDL)  . LBBB (left bundle branch block)  . Hx of CABG  . Foot drop, right  . Acute MI, anterolateral wall  . Laceration of head  . Prostatitis  . E. coli UTI (urinary tract infection)  . Elevated transaminase level  . SIRS (systemic  inflammatory response syndrome)  . Bacteremia due to Escherichia coli  . Glucose intolerance (impaired glucose tolerance)  . Normocytic anemia  . Junctional bradycardia    History  Smoking status  . Never Smoker   Smokeless tobacco  . Never Used    History  Alcohol Use No    Family History  Problem Relation Age of Onset  . Heart attack Mother   . Addison's disease Father   . Heart attack Sister   . Anesthesia problems Neg Hx   . Hypotension Neg Hx   . Malignant hyperthermia Neg Hx   . Pseudochol deficiency Neg Hx     Review of Systems: Constitutional: no fever chills diaphoresis or fatigue or change in weight.  Head and neck: no hearing loss, no epistaxis, no photophobia or visual disturbance. Respiratory: No cough, shortness of breath or wheezing. Cardiovascular: No chest pain peripheral edema, palpitations. Gastrointestinal: No abdominal distention, no abdominal pain, no change in bowel habits hematochezia or melena. Genitourinary: No dysuria, no frequency, no urgency, no nocturia. Musculoskeletal:No arthralgias, no back pain, no gait disturbance or myalgias. Neurological: No dizziness, no headaches, no numbness, no seizures, no syncope, no weakness, no tremors. Hematologic: No lymphadenopathy, no easy bruising. Psychiatric: No confusion, no hallucinations, no sleep disturbance.    Physical Exam: Filed Vitals:   08/01/12 0804  BP: 134/63  Pulse: 67   the general appearance reveals a well-developed well-nourished gentleman in no distress.  He is wearing nasal oxygen prongs.The head and neck exam reveals pupils equal and reactive.  Extraocular movements are full.  There is no scleral icterus.  The mouth and pharynx are normal.  The neck is supple.  The carotids reveal no bruits.  The jugular venous pressure is normal.  The  thyroid is not enlarged.  There is no lymphadenopathy.  The chest is clear to percussion and auscultation.  There are no rales or rhonchi.   Expansion of the chest is symmetrical.  The precordium is quiet.  The first heart sound is normal.  The second heart sound is paradoxically split.  There is a soft systolic ejection murmur at the base.  There is no abnormal lift or heave.  The abdomen is soft and nontender.  The bowel sounds are normal.  The liver and spleen are not enlarged.  There are no abdominal masses.  There are no abdominal bruits.  Extremities reveal good pedal pulses.  There is no phlebitis or edema.  There is no cyanosis or clubbing.  Strength  is normal and symmetrical in all extremities.  There is no lateralizing weakness.  The patient has a right foot drop and wears a foot brace.  There are no sensory deficits.  The skin is warm and dry.  There is no rash.  EKG today shows normal sinus rhythm with left bundle branch block  Assessment / Plan: Overall the patient appears to be doing well and will continue on same medication.  Recheck in 4 weeks for followup office visit and basal metabolic panel.

## 2012-08-03 ENCOUNTER — Telehealth: Payer: Self-pay | Admitting: *Deleted

## 2012-08-03 NOTE — Telephone Encounter (Signed)
Message copied by Burnell Blanks on Thu Aug 03, 2012  3:41 PM ------      Message from: Cassell Clement      Created: Tue Aug 01, 2012  4:30 PM       Please report to patient.  The recent labs are stable. Continue same medication and careful diet.  Potassium is normal.  Liver functions are improving

## 2012-08-03 NOTE — Telephone Encounter (Signed)
Advised patient of lab results  

## 2012-08-10 ENCOUNTER — Telehealth: Payer: Self-pay | Admitting: Cardiology

## 2012-08-10 NOTE — Telephone Encounter (Signed)
Agree with advice given

## 2012-08-10 NOTE — Telephone Encounter (Signed)
Spoke with pt's home health nurse. She states pt told her about chest and left arm pain he had Tuesday night. It lasted about 2 hours. He did not take NTG. He did take TUMS. Noticed increased heart rate with pain. No shortness of breath or sweating. He has not had any pain since.  Home Health RN has instructed him on use of NTG and that he needs to let us know if he has more episodes of pain.  RN is asking if she can continue home health visits.  She would like to continue to see him once per week for 3 weeks.  I gave approval to continue home health visits.

## 2012-08-10 NOTE — Telephone Encounter (Signed)
plz return call to Sinclair Grooms [phone] regarding additional orders for patient

## 2012-08-18 ENCOUNTER — Telehealth: Payer: Self-pay | Admitting: Family Medicine

## 2012-08-18 NOTE — Telephone Encounter (Signed)
FYI:  Pt had a fall Wed evening. No injury. A little sore, but that all. Pt fell on his bottem.

## 2012-08-25 ENCOUNTER — Other Ambulatory Visit: Payer: Self-pay | Admitting: *Deleted

## 2012-08-25 DIAGNOSIS — I10 Essential (primary) hypertension: Secondary | ICD-10-CM

## 2012-08-28 ENCOUNTER — Telehealth: Payer: Self-pay | Admitting: Cardiology

## 2012-08-28 NOTE — Telephone Encounter (Signed)
Left message to call back  

## 2012-08-28 NOTE — Telephone Encounter (Signed)
PT with [facility],. Pt felt faint and had to lay down, morning time has dizzy spells

## 2012-08-28 NOTE — Telephone Encounter (Signed)
We'll discuss medications at office visit

## 2012-08-28 NOTE — Telephone Encounter (Signed)
Follow-up: ° ° ° °Called in returning your call.  Please call back. °

## 2012-08-28 NOTE — Telephone Encounter (Signed)
1) When he gets up in am and takes his medications he gets lightheaded and dizzy but usually subsides.  PT gets at home around 10:30 am and he does fine with her. Today patient was ok with PT but earlier he felt like he was going to pass out.  Tolerated PT fine  2)Still having occasional chest pains radiating down left arm.    Beth PT just wanted for  Dr. Patty Sermons to be aware for patients appointment

## 2012-08-29 ENCOUNTER — Other Ambulatory Visit (INDEPENDENT_AMBULATORY_CARE_PROVIDER_SITE_OTHER): Payer: Medicare Other

## 2012-08-29 ENCOUNTER — Encounter: Payer: Self-pay | Admitting: Cardiology

## 2012-08-29 ENCOUNTER — Ambulatory Visit (INDEPENDENT_AMBULATORY_CARE_PROVIDER_SITE_OTHER): Payer: Medicare Other | Admitting: Cardiology

## 2012-08-29 VITALS — BP 110/62 | HR 57 | Resp 18 | Wt 165.0 lb

## 2012-08-29 DIAGNOSIS — E785 Hyperlipidemia, unspecified: Secondary | ICD-10-CM

## 2012-08-29 DIAGNOSIS — I10 Essential (primary) hypertension: Secondary | ICD-10-CM

## 2012-08-29 DIAGNOSIS — I447 Left bundle-branch block, unspecified: Secondary | ICD-10-CM

## 2012-08-29 DIAGNOSIS — R42 Dizziness and giddiness: Secondary | ICD-10-CM

## 2012-08-29 DIAGNOSIS — I259 Chronic ischemic heart disease, unspecified: Secondary | ICD-10-CM

## 2012-08-29 DIAGNOSIS — I209 Angina pectoris, unspecified: Secondary | ICD-10-CM

## 2012-08-29 LAB — BASIC METABOLIC PANEL
CO2: 27 mEq/L (ref 19–32)
Calcium: 9.5 mg/dL (ref 8.4–10.5)
Sodium: 138 mEq/L (ref 135–145)

## 2012-08-29 NOTE — Progress Notes (Signed)
[name] Date of Birth:  01/14/1925 [facility] [address] [place], [place]  [address] [phone]         Fax   [phone]  History of Present Illness: 78 y.o. male who presented to [facility] on 07/15/12 after falling at home with subsequent head laceration. While in the ED he developed chest pain followed by VT requiring cardioversion and an EKG showing anterolateral ST elevation. Earlier that week he was diagnosed with prostatitis and was febrile at home with temp up to 103. [name] He complained of chest pain for which a repeat EKG was obtained showing ST elevation in 1, aVL, V2-3. He developed sustained VT without a pulse requiring cardioversion and amiodarone with return of spontaneous circulation. He was taken emergently to the cath lab and treated with BMS to the mid LAD. He tolerated the procedure well without complications. He went to the ICU and was continued on IV amiodarone. Amiodarone was stopped and he remained without further VT. Echo showed EF 40-45%, mod LAE.  He has a history of extensive ischemic heart disease. He had coronary artery bypass graft surgery in 1986. He had a redo CABG in 1993. He had PTCA with stent in October 2008. He had catheter with a drug-eluting stent in February 2010. He had a small non-Q MI in August 2010. His last nuclear stress test was 10/27/09 showing a moderate area of reversible ischemia in the inferolateral wall with an ejection fraction of 64%.    Current Outpatient Prescriptions  Medication Sig Dispense Refill  . aspirin 325 MG tablet Take 325 mg by mouth daily.      . calcium-vitamin D (OSCAL WITH D 500-200) 500-200 MG-UNIT per tablet Take 1 tablet by mouth 2 (two) times daily.        . carbamazepine (CARBATROL) 300 MG 12 hr capsule Take 300 mg by mouth 2 (two) times daily.        . clopidogrel (PLAVIX) 75 MG tablet Take 75 mg by mouth daily.        . finasteride (PROSCAR) 5 MG tablet Take 5 mg by mouth daily.         . furosemide (LASIX) 40 MG tablet Take 1 tablet (40 mg total) by mouth daily.  30 tablet  6  . ipratropium (ATROVENT HFA) 17 MCG/ACT inhaler Inhale 2 puffs into the lungs as needed.      . isosorbide mononitrate (IMDUR) 60 MG 24 hr tablet Take 1.5 tablets (90 mg total) by mouth daily.  60 tablet  6  . metoprolol succinate (TOPROL-XL) 25 MG 24 hr tablet Take 1 tablet (25 mg total) by mouth daily.  30 tablet  6  . mometasone-formoterol (DULERA) 100-5 MCG/ACT AERO Inhale 2 puffs into the lungs 2 (two) times daily.      . nitroGLYCERIN (NITROSTAT) 0.4 MG SL tablet Place 0.4 mg under the tongue every 5 (five) minutes as needed. For chest pain      . potassium chloride SA (K-DUR,KLOR-CON) 20 MEQ tablet Take 1 tablet (20 mEq total) by mouth daily.  30 tablet  6  . ranitidine (ZANTAC) 150 MG capsule Take 150 mg by mouth daily.      . rosuvastatin (CRESTOR) 40 MG tablet Take 40 mg by mouth daily.      . sertraline (ZOLOFT) 100 MG tablet Take 150 mg by mouth daily.       [name] sulfamethoxazole-trimethoprim (BACTRIM DS) 800-160 MG per tablet Take 1 tablet by mouth  every 12 (twelve) hours.  23 tablet  0  . temazepam (RESTORIL) 15 MG capsule Take 15 mg by mouth at bedtime as needed. For sleep        No Known Allergies  Patient Active Problem List  Diagnosis  . HYPERLIPIDEMIA  . PERIPHERAL NEUROPATHY  . HYPERTENSION  . Cor Athrscl-Uns Vessel  . GERD  . HERNIA  . PSORIASIS  . OSTEOARTHRITIS  . OSTEOPOROSIS  . PROSTATE CANCER, HX OF  . Ischemic heart disease  . Myocardial infarction  . Acute systolic congestive heart failure, NYHA class 2-EF 40-45%  . Hypoxia  . Chronic asthma  . Peripheral neuropathy  . Dizziness  . Dyslipidemia (high LDL; low HDL)  . LBBB (left bundle branch block)  . Hx of CABG  . Foot drop, right  . Acute MI, anterolateral wall  . Laceration of head  . Prostatitis  . E. coli UTI (urinary tract infection)  . Elevated transaminase level  . SIRS (systemic  inflammatory response syndrome)  . Bacteremia due to Escherichia coli  . Glucose intolerance (impaired glucose tolerance)  . Normocytic anemia  . Junctional bradycardia  . COPD (chronic obstructive pulmonary disease)    History  Smoking status  . Never Smoker   Smokeless tobacco  . Never Used    History  Alcohol Use No    Family History  Problem Relation Age of Onset  . Heart attack Mother   . Addison's disease Father   . Heart attack Sister   . Anesthesia problems Neg Hx   . Hypotension Neg Hx   . Malignant hyperthermia Neg Hx   . Pseudochol deficiency Neg Hx     Review of Systems: Constitutional: no fever chills diaphoresis or fatigue or change in weight.  Head and neck: no hearing loss, no epistaxis, no photophobia or visual disturbance. Respiratory: No cough, shortness of breath or wheezing. Cardiovascular: No chest pain peripheral edema, palpitations. Gastrointestinal: No abdominal distention, no abdominal pain, no change in bowel habits hematochezia or melena. Genitourinary: No dysuria, no frequency, no urgency, no nocturia. Musculoskeletal:No arthralgias, no back pain, no gait disturbance or myalgias. Neurological: No dizziness, no headaches, no numbness, no seizures, no syncope, no weakness, no tremors. Hematologic: No lymphadenopathy, no easy bruising. Psychiatric: No confusion, no hallucinations, no sleep disturbance.    Physical Exam: Filed Vitals:   08/29/12 1049  BP: 110/62  Pulse: 57  Resp: 18   the general appearance reveals a thin elderly gentleman in no acute distress.The head and neck exam reveals pupils equal and reactive.  Extraocular movements are full.  There is no scleral icterus.  The mouth and pharynx are normal.  The neck is supple.  The carotids reveal no bruits.  The jugular venous pressure is normal.  The  thyroid is not enlarged.  There is no lymphadenopathy.  The chest is clear to percussion and auscultation.  There are no rales or  rhonchi.  Expansion of the chest is symmetrical.  The precordium is quiet.  The first heart sound is normal.  The second heart sound is physiologically split.  There is no murmur gallop rub or click.  There is no abnormal lift or heave.  The abdomen is soft and nontender.  The bowel sounds are normal.  The liver and spleen are not enlarged.  There are no abdominal masses.  There are no abdominal bruits.  Extremities reveal good pedal pulses.  There is no phlebitis or edema.  There is no cyanosis or clubbing.  Strength reveals a foot drop of his right foot and he wears a brace.  There is no lateralizing weakness.  There are no sensory deficits.  The skin is warm and dry.  There is no rash.  EKG today shows sinus bradycardia at 54 per minute with left bundle branch block and is unchanged since 08/01/12.   Assessment / Plan: Continue same medication except change the timing of indoor to lunchtime.  Continue to use sublingual nitroglycerin when necessary. Recheck in 2 months for followup office visit CBC lipid panel hepatic function panel and basal metabolic panel.

## 2012-08-29 NOTE — Assessment & Plan Note (Signed)
EKG today shows chronic left bundle-branch block which is asymptomatic

## 2012-08-29 NOTE — Assessment & Plan Note (Signed)
The patient has had several episodes of mild angina pectoris relieved by a single nitroglycerin.  Normally the angina is brought on by physical exertion or by emotional stress.  Today we considered adding Ranexa to his regimen which he might be able to obtain from the [facility].  However the ranexa would be prone to cause further low blood pressure situation and so we will reserve Ranexa for more refractory chest pain.

## 2012-08-29 NOTE — Assessment & Plan Note (Signed)
The patient has been experiencing dizzy spells in the morning.  Normally after breakfast he would be standing in the kitchen trying the breakfast dishes.  However for the past 2 mornings he has had to go back to bed because of dizziness.  He feels that the dizziness may be related to the fact that he has been taking his isosorbide mononitrate early in the morning causing low blood pressure.  He took his blood pressure on 2 successive mornings and his systolic pressure was only 89 and 84.  We will change the timing of his isosorbide mononitrate from breakfast to lunchtime and see if this makes a difference.

## 2012-08-29 NOTE — Progress Notes (Signed)
Quick Note:  Please report to patient. The recent labs are stable. Continue same medication and careful diet. ______ 

## 2012-08-29 NOTE — Patient Instructions (Addendum)
Your physician recommends that you continue on your current medications as directed. Please refer to the Current Medication list given to you today.  Your physician recommends that you return for lab work in: at next visit in 2 months  Your physician recommends that you schedule a follow-up appointment in: Feb. 28 (Dont eat or drink after midnight the night before labs are drawn)

## 2012-08-30 DIAGNOSIS — D649 Anemia, unspecified: Secondary | ICD-10-CM

## 2012-08-30 HISTORY — DX: Anemia, unspecified: D64.9

## 2012-09-01 NOTE — Addendum Note (Signed)
Addended by: Andrey Cota A on: 09/01/2012 03:11 PM   Modules accepted: Orders

## 2012-09-05 ENCOUNTER — Encounter: Payer: Self-pay | Admitting: Cardiology

## 2012-09-06 ENCOUNTER — Telehealth: Payer: Self-pay | Admitting: Internal Medicine

## 2012-09-06 MED ORDER — MOMETASONE FURO-FORMOTEROL FUM 100-5 MCG/ACT IN AERO: 2.0000 | INHALATION_SPRAY | Freq: Two times a day (BID) | RESPIRATORY_TRACT | Status: DC

## 2012-09-06 NOTE — Telephone Encounter (Signed)
Sample left at front. Pt aware according to pt instruct he is to continue dulera until f/u on 09-12-12. Carron Curie, CMA

## 2012-09-07 ENCOUNTER — Telehealth: Payer: Self-pay | Admitting: *Deleted

## 2012-09-07 NOTE — Telephone Encounter (Signed)
-----   Message from [employer] to Cassell Clement, MD sent at 09/05/2012 10:24 AM ----- I had blood work on 08/29/12 and my daughter took a message from someone to let me know that my results were okay, but I have yet to see these results on my chart. For MyChart to work, all results need to be on it. Could you please enlighten me on this? Thank you.  Spoke with patient and gave him the number to call technical support. I did review the labs and they had still not been released

## 2012-09-12 ENCOUNTER — Encounter: Payer: Self-pay | Admitting: Internal Medicine

## 2012-09-12 ENCOUNTER — Ambulatory Visit (INDEPENDENT_AMBULATORY_CARE_PROVIDER_SITE_OTHER): Payer: Medicare Other | Admitting: Internal Medicine

## 2012-09-12 ENCOUNTER — Ambulatory Visit (INDEPENDENT_AMBULATORY_CARE_PROVIDER_SITE_OTHER)
Admission: RE | Admit: 2012-09-12 | Discharge: 2012-09-12 | Disposition: A | Discharge status: Home / Self Care | Payer: Medicare Other | Source: Ambulatory Visit | Attending: Internal Medicine | Admitting: Internal Medicine

## 2012-09-12 VITALS — BP 140/80 | HR 65 | Temp 98.1°F | Ht 70.0 in | Wt 165.0 lb

## 2012-09-12 DIAGNOSIS — J45909 Unspecified asthma, uncomplicated: Secondary | ICD-10-CM

## 2012-09-12 DIAGNOSIS — R0902 Hypoxemia: Secondary | ICD-10-CM

## 2012-09-12 DIAGNOSIS — J449 Chronic obstructive pulmonary disease, unspecified: Secondary | ICD-10-CM

## 2012-09-12 LAB — PULMONARY FUNCTION TEST

## 2012-09-12 MED ORDER — BUDESONIDE-FORMOTEROL FUMARATE 80-4.5 MCG/ACT IN AERO: 2.0000 | INHALATION_SPRAY | Freq: Two times a day (BID) | RESPIRATORY_TRACT | Status: AC

## 2012-09-12 NOTE — Progress Notes (Signed)
PFT done today. 

## 2012-09-12 NOTE — Assessment & Plan Note (Signed)
Placed on 02 at discharge from [facility] 07/22/12 @ 2lpm 24/7 - 07/26/2012  Walked 2 lpm x 2 laps @ 185 ft each stopped due to weak> sob and  no desat - 09/12/2012  Walked 2lpm   2 laps @ 185 ft each stopped due to sob/ fatigue but sats still 97%   Due to chf he should probably stay on 24 h 02 for now

## 2012-09-12 NOTE — Assessment & Plan Note (Addendum)
-   Spirometry 07/26/2012  1.77 (69%) ratio 65    - PFT's 09/12/2012 FEV1 1.96 (79%) ratio 64 normalized p B2 and DLCO 94%  This is classic chronic asthma, not copd, with the distinction important because his lung function can be normalized on LABA/ICS and maintained nl if he is more consistent with use of hfa and the 80 symbicort better choice due to hoarseness on the higher strength ics = symbicort 160/ dulera 200 - and can only get symbicort from [name], not dulera 100.  The proper method of use, as well as anticipated side effects, of a metered-dose inhaler are discussed and demonstrated to the patient. Improved effectiveness after extensive coaching during this visit to a level of approximately  90%    Each maintenance medication was reviewed in detail including most importantly the difference between maintenance and as needed and under what circumstances the prns are to be used.  Please see instructions for details which were reviewed in writing and the patient given a copy.

## 2012-09-12 NOTE — Patient Instructions (Addendum)
Symbicort 80 Take 2 puffs first thing in am and then another 2 puffs about 12 hours later (ok to use up  the 160 strength but probably not needed in your case)   Ok to use atrovent if needed but if you learn to use symbicort effectively you won't find you need much atrovent  You do not have significant copd and you never will as you never smoked and your normalizes after bronchodilators.  Work on inhaler technique:  relax and gently blow all the way out then take a nice smooth deep breath back in, triggering the inhaler at same time you start breathing in.  Hold for up to 5 seconds if you can.  Rinse and gargle with water when done   If your mouth or throat starts to bother you,   I suggest you time the inhaler to your dental care and after using the inhaler(s) brush teeth and tongue with a baking soda containing toothpaste and when you rinse this out, gargle with it first to see if this helps your mouth and throat.     Please schedule a follow up visit in 3 months but call sooner if needed

## 2012-09-12 NOTE — Progress Notes (Signed)
Subjective:     Patient ID: [name], male   DOB: Jun 27, 1925   MRN: [id number]  HPI  79 yowm never smoker carries dx of copd since around 2010 but pft's  nl p B2 09/12/2012   MRN: [id number], DOB/AGE: 12/27/1924 79 y.o.  Primary MD: Evette Georges, MD Primary Cardiologist: Dr. Patty Sermons  Admit date: 07/15/2012  D/C date: 07/22/2012   Primary Discharge Diagnoses:  1. Acute anterolateral STEMI  - s/p BMS to mid LAD 07/15/12  2. Ventricular Tachycardia  - s/p defib/amio, likely ischemic, quiescent s/p revascularization.  3. Acute systolic CHF  - EF 40-45% by echo 07/18/12  - ACEI not initiated due to soft BP, Will need to start as outpatient  4. Escherichia coli bacteremia/SIRS  - likely secondary to UTI/prostatitis  - To complete 2 weeks of Bactrim (08/02/12)  - F/u w/ Urologist Dr. Mena Goes  5. Hypertension  6. Head laceration 2/2 fall  s/p sutures/stables in ED 07/15/12 - will need removal of sutures 7-10 days (11/23-11/26)  7. COPD  - continue inhaled corticosteroid/Spiriva, plan outpatient PFTs, cardiopulm rehab, and home oxygen.  8. Anemia  9. Transaminitis  - felt due to MI.  10. Hyperlipidemia  11. Glucose Intolerance  - A1c 5.8, f/u w/ PCP  12. Hypokalemia, resolved  07/26/2012 post hosp f/u newly on 02 2lpm 24 h per day.  Main issue is weak.  Previous to hosp atrovent prn > discharge on dulera and spiriva cc doe x 50 ft even on 02. No obvious daytime variabilty or assoc chronic cough or cp or chest tightness, subjective wheeze overt sinus or hb symptoms. No unusual exp hx or h/o childhood pna/ asthma or premature birth to his knowledge.  rec Stop spiriva Dulera 200 Take 2 puffs first thing in am and then another 2 puffs about 12 hours later until return Stay on 02 2lpm 24 h per day Only use atrovent if needed up to every 4 hours for difficulty breathing/wheezing Work on inhaler technique Please schedule a follow up office visit in 6 weeks, call sooner if needed  with pfts and cxr   09/12/2012 f/u ov/Talor Desrosiers cc doe x 77ft even on 02 (not reproducible on 02 in office). Some hoarseness on dulera 200 and va changed to symbicort 160.  No obvious daytime variabilty or assoc chronic cough or cp or chest tightness, subjective wheeze overt sinus or hb symptoms. No unusual exp hx or h/o childhood pna/ asthma or premature birth to his knowledge.    Sleeping ok without nocturnal  or early am exacerbation  of respiratory  c/o's or need for noct saba. Also denies any obvious fluctuation of symptoms with weather or environmental changes or other aggravating or alleviating factors except as outlined above  ROS  The following are not active complaints unless bolded sore throat, dysphagia, dental problems, itching, sneezing,  nasal congestion or excess/ purulent secretions, ear ache,   fever, chills, sweats, unintended wt loss, pleuritic or exertional cp, hemoptysis,  orthopnea pnd or leg swelling, presyncope, palpitations, heartburn, abdominal pain, anorexia, nausea, vomiting, diarrhea  or change in bowel or urinary habits, change in stools or urine, dysuria,hematuria,  rash, arthralgias, visual complaints, headache, numbness weakness or ataxia or problems with walking or coordination,  change in mood/affect or memory.            Objective:   Physical Exam    Wt 165 09/12/2012  Wt Readings from Last 3 Encounters:  07/26/12 167 lb 3.2 oz (75.841 kg)  07/25/12 167 lb (75.751 kg)  07/25/12 165 lb (74.844 kg)    HEENT: nl dentition, turbinates, and orophanx. Nl external ear canals without cough reflex   NECK :  without JVD/Nodes/TM/ nl carotid upstrokes bilaterally   LUNGS: no acc muscle use, clear to A and P bilaterally without cough on insp or exp maneuvers   CV:  RRR  no s3 or murmur or increase in P2, no edema   ABD:  soft and nontender with nl excursion in the supine position. No bruits or organomegaly, bowel sounds nl  MS:  warm without deformities, calf  tenderness, cyanosis or clubbing  SKIN: warm and dry without lesions    NEURO:  alert, approp, no deficits   CXR  09/12/2012 : Mod CE no effusions   Assessment:          Plan:

## 2012-09-13 ENCOUNTER — Telehealth: Payer: Self-pay | Admitting: Internal Medicine

## 2012-09-13 NOTE — Telephone Encounter (Signed)
I did not try to call,  Probably someone from the office trying to report results of studies ? cxr

## 2012-09-14 NOTE — Telephone Encounter (Signed)
Pt notified of the results below and verbalized understanding.    Notes Recorded by Christen Butter, CMA on 09/12/2012 at 4:48 PM [facility] ------  Notes Recorded by Nyoka Cowden, MD on 09/12/2012 at 3:49 PM Call pt: Reviewed cxr and no acute change so no change in recommendations made at [facility]

## 2012-10-02 ENCOUNTER — Encounter: Payer: Self-pay | Admitting: Internal Medicine

## 2012-10-12 ENCOUNTER — Encounter: Payer: Self-pay | Admitting: Internal Medicine

## 2012-10-16 NOTE — Telephone Encounter (Signed)
I spoke with the pt and he would like clarification on his last PFT. He cannot see all of the values and does not understand what he is seeing. MW, pls advise.

## 2012-10-27 ENCOUNTER — Encounter: Payer: Self-pay | Admitting: Internal Medicine

## 2012-10-27 ENCOUNTER — Other Ambulatory Visit (INDEPENDENT_AMBULATORY_CARE_PROVIDER_SITE_OTHER): Payer: Medicare Other

## 2012-10-27 ENCOUNTER — Ambulatory Visit (INDEPENDENT_AMBULATORY_CARE_PROVIDER_SITE_OTHER): Payer: Medicare Other | Admitting: Cardiology

## 2012-10-27 ENCOUNTER — Encounter: Payer: Self-pay | Admitting: Cardiology

## 2012-10-27 VITALS — BP 146/73 | HR 61 | Ht >= 80 in | Wt 165.8 lb

## 2012-10-27 DIAGNOSIS — D649 Anemia, unspecified: Secondary | ICD-10-CM

## 2012-10-27 DIAGNOSIS — E785 Hyperlipidemia, unspecified: Secondary | ICD-10-CM

## 2012-10-27 DIAGNOSIS — I209 Angina pectoris, unspecified: Secondary | ICD-10-CM

## 2012-10-27 DIAGNOSIS — R0989 Other specified symptoms and signs involving the circulatory and respiratory systems: Secondary | ICD-10-CM

## 2012-10-27 DIAGNOSIS — R001 Bradycardia, unspecified: Secondary | ICD-10-CM

## 2012-10-27 DIAGNOSIS — I447 Left bundle-branch block, unspecified: Secondary | ICD-10-CM

## 2012-10-27 DIAGNOSIS — I259 Chronic ischemic heart disease, unspecified: Secondary | ICD-10-CM

## 2012-10-27 DIAGNOSIS — I498 Other specified cardiac arrhythmias: Secondary | ICD-10-CM

## 2012-10-27 DIAGNOSIS — R42 Dizziness and giddiness: Secondary | ICD-10-CM

## 2012-10-27 LAB — HEPATIC FUNCTION PANEL
ALT: 16 U/L (ref 0–53)
Albumin: 3.9 g/dL (ref 3.5–5.2)
Alkaline Phosphatase: 68 U/L (ref 39–117)
Bilirubin, Direct: 0.1 mg/dL (ref 0.0–0.3)
Total Protein: 7.1 g/dL (ref 6.0–8.3)

## 2012-10-27 LAB — LIPID PANEL
Cholesterol: 192 mg/dL (ref 0–200)
LDL Cholesterol: 119 mg/dL — ABNORMAL HIGH (ref 0–99)
Triglycerides: 91 mg/dL (ref 0.0–149.0)
VLDL: 18.2 mg/dL (ref 0.0–40.0)

## 2012-10-27 LAB — CBC WITH DIFFERENTIAL/PLATELET
Basophils Absolute: 0 10*3/uL (ref 0.0–0.1)
Eosinophils Relative: 1 % (ref 0.0–5.0)
Monocytes Relative: 7.2 % (ref 3.0–12.0)
Neutrophils Relative %: 58.4 % (ref 43.0–77.0)
Platelets: 224 10*3/uL (ref 150.0–400.0)
RDW: 13.6 % (ref 11.5–14.6)
WBC: 4.8 10*3/uL (ref 4.5–10.5)

## 2012-10-27 LAB — BASIC METABOLIC PANEL
BUN: 19 mg/dL (ref 6–23)
Chloride: 101 mEq/L (ref 96–112)
Creatinine, Ser: 1 mg/dL (ref 0.4–1.5)
GFR: 73.66 mL/min (ref >60.00)
Potassium: 4 mEq/L (ref 3.5–5.1)

## 2012-10-27 NOTE — Assessment & Plan Note (Signed)
The patient continues to have occasional episodes of chest discomfort.  The episodes have been relieved by one or 2 sublingual nitroglycerin.  The episodes sometimes occur at rest and sometimes with exertion or with emotional stress.

## 2012-10-27 NOTE — Assessment & Plan Note (Signed)
The patient has a history of coronary disease and hyperlipidemia.  He previously had been on Crestor.  Recently the [facility] switched him from the Crestor to Lipitor 80 mg daily.  So far he is not experiencing any adverse effects in terms of increased myalgias.

## 2012-10-27 NOTE — Assessment & Plan Note (Addendum)
The patient is doing well on the decreased dose of Toprol 25 mg once a day.  He has not been aware of any bradycardia.

## 2012-10-27 NOTE — Progress Notes (Signed)
[name] Date of Birth:  [birth year] [facility] [address] [place], [place]  [address] [phone]         Fax   [phone]  History of Present Illness: 79 y.o. male who presented to [facility] on 07/15/12 after falling at home with subsequent head laceration. While in the ED he developed chest pain followed by VT requiring cardioversion and an EKG showing anterolateral ST elevation. Earlier that week he was diagnosed with prostatitis and was febrile at home with temp up to 103. [name] He complained of chest pain for which a repeat EKG was obtained showing ST elevation in 1, aVL, V2-3. He developed sustained VT without a pulse requiring cardioversion and amiodarone with return of spontaneous circulation. He was taken emergently to the cath lab and treated with BMS to the mid LAD. He tolerated the procedure well without complications. He went to the ICU and was continued on IV amiodarone. Amiodarone was stopped and he remained without further VT. Echo showed EF 40-45%, mod LAE.  He has a history of extensive ischemic heart disease. He had coronary artery bypass graft surgery in 1986. He had a redo CABG in 1993. He had PTCA with stent in October 2008. He had catheter with a drug-eluting stent in February 2010. He had a small non-Q MI in August 2010. His last nuclear stress test was 10/27/09 showing a moderate area of reversible ischemia in the inferolateral wall with an ejection fraction of 64%.  Since last visit he has had no new cardiac events.  Current Outpatient Prescriptions  Medication Sig Dispense Refill  . budesonide-formoterol (SYMBICORT) 80-4.5 MCG/ACT inhaler Inhale 2 puffs into the lungs 2 (two) times daily.      . calcium-vitamin D (OSCAL WITH D 500-200) 500-200 MG-UNIT per tablet Take 1 tablet by mouth 2 (two) times daily.        . carbamazepine (CARBATROL) 300 MG 12 hr capsule Take 300 mg by mouth 2 (two) times daily.        . clopidogrel (PLAVIX)  75 MG tablet Take 75 mg by mouth daily.        . finasteride (PROSCAR) 5 MG tablet Take 5 mg by mouth daily.        . furosemide (LASIX) 40 MG tablet Take 1 tablet (40 mg total) by mouth daily.  30 tablet  6  . ipratropium (ATROVENT HFA) 17 MCG/ACT inhaler Inhale 2 puffs into the lungs as needed.      . isosorbide mononitrate (IMDUR) 60 MG 24 hr tablet Take 1.5 tablets (90 mg total) by mouth daily.  60 tablet  6  . metoprolol succinate (TOPROL-XL) 25 MG 24 hr tablet Take 1 tablet (25 mg total) by mouth daily.  30 tablet  6  . nitroGLYCERIN (NITROSTAT) 0.4 MG SL tablet Place 0.4 mg under the tongue every 5 (five) minutes as needed. For chest pain      . potassium chloride SA (K-DUR,KLOR-CON) 20 MEQ tablet Take 1 tablet (20 mEq total) by mouth daily.  30 tablet  6  . ranitidine (ZANTAC) 150 MG capsule Take 150 mg by mouth daily.      . sertraline (ZOLOFT) 100 MG tablet Take 150 mg by mouth daily.       . temazepam (RESTORIL) 15 MG capsule Take 15 mg by mouth at bedtime as needed. For sleep      . aspirin 81 MG tablet Take 1 tablet (81 mg total) by mouth daily.  30 tablet  6  . atorvastatin (LIPITOR) 80 MG tablet Take 1 tablet (80 mg total) by mouth daily.  30 tablet  6   No current facility-administered medications for this visit.    No Known Allergies  Patient Active Problem List  Diagnosis  . HYPERLIPIDEMIA  . PERIPHERAL NEUROPATHY  . HYPERTENSION  . Cor Athrscl-Uns Vessel  . GERD  . HERNIA  . PSORIASIS  . OSTEOARTHRITIS  . OSTEOPOROSIS  . PROSTATE CANCER, HX OF  . Ischemic heart disease  . Myocardial infarction  . Acute systolic congestive heart failure, NYHA class 2-EF 40-45%  . Chronic respiratory failure  . Chronic asthma  . Peripheral neuropathy  . Dizziness  . Dyslipidemia (high LDL; low HDL)  . LBBB (left bundle branch block)  . Hx of CABG  . Foot drop, right  . Acute MI, anterolateral wall  . Laceration of head  . Prostatitis  . E. coli UTI (urinary tract  infection)  . Elevated transaminase level  . SIRS (systemic inflammatory response syndrome)  . Bacteremia due to Escherichia coli  . Glucose intolerance (impaired glucose tolerance)  . Normocytic anemia  . Junctional bradycardia    History  Smoking status  . Never Smoker   Smokeless tobacco  . Never Used    History  Alcohol Use No    Family History  Problem Relation Age of Onset  . Heart attack Mother   . Addison's disease Father   . Heart attack Sister   . Anesthesia problems Neg Hx   . Hypotension Neg Hx   . Malignant hyperthermia Neg Hx   . Pseudochol deficiency Neg Hx     Review of Systems: Constitutional: no fever chills diaphoresis or fatigue or change in weight.  Head and neck: no hearing loss, no epistaxis, no photophobia or visual disturbance. Respiratory: No cough, shortness of breath or wheezing. Cardiovascular: No chest pain peripheral edema, palpitations. Gastrointestinal: No abdominal distention, no abdominal pain, no change in bowel habits hematochezia or melena. Genitourinary: No dysuria, no frequency, no urgency, no nocturia. Musculoskeletal:No arthralgias, no back pain, no gait disturbance or myalgias. Neurological: No dizziness, no headaches, no numbness, no seizures, no syncope, no weakness, no tremors. Hematologic: No lymphadenopathy, no easy bruising. Psychiatric: No confusion, no hallucinations, no sleep disturbance.    Physical Exam: Filed Vitals:   10/27/12 1048  BP: 146/73  Pulse: 61   the general appearance reveals a well-developed well-nourished elderly gentleman in no distress.The head and neck exam reveals pupils equal and reactive.  Extraocular movements are full.  There is no scleral icterus.  The mouth and pharynx are normal.  The neck is supple.  The carotids reveal no bruits.  The jugular venous pressure is normal.  The  thyroid is not enlarged.  There is no lymphadenopathy.  The chest is clear to percussion and auscultation.  There  are no rales or rhonchi.  Expansion of the chest is symmetrical.  The precordium is quiet.  The first heart sound is normal.  The second heart sound is physiologically split.  There is no murmur gallop rub or click.  There is no abnormal lift or heave.  The abdomen is soft and nontender.  The bowel sounds are normal.  The liver and spleen are not enlarged.  There are no abdominal masses.  There are no abdominal bruits.  Extremities reveal good pedal pulses.  There is no phlebitis or edema.  There is no cyanosis or clubbing.  Strength is normal and symmetrical  in all extremities.  There is no lateralizing weakness.  There are no sensory deficits.  The skin is warm and dry.  There is no rash.     Assessment / Plan: Continue on same medication.  He is on Plavix and his aspirin was recently reduced to 81 mg daily. Recheck in 3 months for followup office visit EKG lipid panel hepatic function panel basal metabolic panel and CBC

## 2012-10-27 NOTE — Assessment & Plan Note (Signed)
Patient has a history of chronic anemia and we are checking a CBC today.  He has not been having a change in bowel habits.  He alternates between constipation and diarrhea.  He has had no hematochezia or melena.

## 2012-10-27 NOTE — Patient Instructions (Addendum)
Lipitor 80 mg daily   Aspirin 81 mg daily   Your physician recommends that you schedule a follow-up appointment in: 3 months with fasting lab.

## 2012-10-29 NOTE — Progress Notes (Signed)
Quick Note:  Please report to patient. The recent labs are stable. Continue same medication and careful diet. Hgb is better. LDL higher. Watch diet. Continue lipitor 80 daily ______

## 2012-11-06 ENCOUNTER — Other Ambulatory Visit: Payer: Medicare Other

## 2012-11-08 ENCOUNTER — Ambulatory Visit: Payer: Medicare Other | Admitting: Cardiology

## 2012-11-29 ENCOUNTER — Ambulatory Visit (INDEPENDENT_AMBULATORY_CARE_PROVIDER_SITE_OTHER): Payer: Medicare Other | Admitting: Cardiology

## 2012-11-29 ENCOUNTER — Encounter: Payer: Self-pay | Admitting: Cardiology

## 2012-11-29 VITALS — BP 150/71 | HR 59 | Ht 67.0 in | Wt 165.0 lb

## 2012-11-29 DIAGNOSIS — I509 Heart failure, unspecified: Secondary | ICD-10-CM

## 2012-11-29 DIAGNOSIS — I255 Ischemic cardiomyopathy: Secondary | ICD-10-CM

## 2012-11-29 DIAGNOSIS — I5021 Acute systolic (congestive) heart failure: Secondary | ICD-10-CM

## 2012-11-29 DIAGNOSIS — I2589 Other forms of chronic ischemic heart disease: Secondary | ICD-10-CM

## 2012-11-29 DIAGNOSIS — I259 Chronic ischemic heart disease, unspecified: Secondary | ICD-10-CM

## 2012-11-29 DIAGNOSIS — I447 Left bundle-branch block, unspecified: Secondary | ICD-10-CM

## 2012-11-29 NOTE — Progress Notes (Signed)
[name] Date of Birth:  [birth year] [facility] [address] [place], [place]  [address] [phone]         Fax   [phone]  History of Present Illness: 79 y.o. male who presented to [facility] on 07/15/12 after falling at home with subsequent head laceration. While in the ED he developed chest pain followed by VT requiring cardioversion and an EKG showing anterolateral ST elevation. Earlier that week he was diagnosed with prostatitis and was febrile at home with temp up to 103. [name] He complained of chest pain for which a repeat EKG was obtained showing ST elevation in 1, aVL, V2-3. He developed sustained VT without a pulse requiring cardioversion and amiodarone with return of spontaneous circulation. He was taken emergently to the cath lab and treated with BMS to the mid LAD. He tolerated the procedure well without complications. He went to the ICU and was continued on IV amiodarone. Amiodarone was stopped and he remained without further VT. Echo showed EF 40-45%, mod LAE.  He has a history of extensive ischemic heart disease. He had coronary artery bypass graft surgery in 1986. He had a redo CABG in 1993. He had PTCA with stent in October 2008. He had catheter with a drug-eluting stent in February 2010. He had a small non-Q MI in August 2010. His last nuclear stress test was 10/27/09 showing a moderate area of reversible ischemia in the inferolateral wall with an ejection fraction of 64%.  Last week the patient was at the [facility] getting a routine checkup and during exam developed chest pain.  They moved him to the emergency room and then admitted him overnight for observation.  His enzymes were negative and he was released the next day.  No change in his medications.   Current Outpatient Prescriptions  Medication Sig Dispense Refill  . aspirin 81 MG tablet Take 1 tablet (81 mg total) by mouth daily.  30 tablet  6  . atorvastatin (LIPITOR) 80 MG  tablet Take 1 tablet (80 mg total) by mouth daily.  30 tablet  6  . budesonide-formoterol (SYMBICORT) 80-4.5 MCG/ACT inhaler Inhale 2 puffs into the lungs 2 (two) times daily.      . calcium-vitamin D (OSCAL WITH D 500-200) 500-200 MG-UNIT per tablet Take 1 tablet by mouth 2 (two) times daily.        . carbamazepine (CARBATROL) 300 MG 12 hr capsule Take 300 mg by mouth 2 (two) times daily.        . clopidogrel (PLAVIX) 75 MG tablet Take 75 mg by mouth daily.        . finasteride (PROSCAR) 5 MG tablet Take 5 mg by mouth daily.        . furosemide (LASIX) 40 MG tablet Take 1 tablet (40 mg total) by mouth daily.  30 tablet  6  . ipratropium (ATROVENT HFA) 17 MCG/ACT inhaler Inhale 2 puffs into the lungs as needed.      . isosorbide mononitrate (IMDUR) 60 MG 24 hr tablet Take 1.5 tablets (90 mg total) by mouth daily.  60 tablet  6  . metoprolol succinate (TOPROL-XL) 25 MG 24 hr tablet Take 1 tablet (25 mg total) by mouth daily.  30 tablet  6  . nitroGLYCERIN (NITROSTAT) 0.4 MG SL tablet Place 0.4 mg under the tongue every 5 (five) minutes as needed. For chest pain      . potassium chloride SA (K-DUR,KLOR-CON) 20 MEQ tablet Take 1 tablet (  20 mEq total) by mouth daily.  30 tablet  6  . ranitidine (ZANTAC) 150 MG capsule Take 150 mg by mouth daily.      . sertraline (ZOLOFT) 100 MG tablet Take 150 mg by mouth daily.       . temazepam (RESTORIL) 15 MG capsule Take 15 mg by mouth at bedtime as needed. For sleep       No current facility-administered medications for this visit.    No Known Allergies  Patient Active Problem List  Diagnosis  . HYPERLIPIDEMIA  . PERIPHERAL NEUROPATHY  . HYPERTENSION  . Cor Athrscl-Uns Vessel  . GERD  . HERNIA  . PSORIASIS  . OSTEOARTHRITIS  . OSTEOPOROSIS  . PROSTATE CANCER, HX OF  . Ischemic heart disease  . Myocardial infarction  . Acute systolic congestive heart failure, NYHA class 2-EF 40-45%  . Chronic respiratory failure  . Chronic asthma  . Peripheral  neuropathy  . Dizziness  . Dyslipidemia (high LDL; low HDL)  . LBBB (left bundle branch block)  . Hx of CABG  . Foot drop, right  . Acute MI, anterolateral wall  . Laceration of head  . Prostatitis  . E. coli UTI (urinary tract infection)  . Elevated transaminase level  . SIRS (systemic inflammatory response syndrome)  . Bacteremia due to Escherichia coli  . Glucose intolerance (impaired glucose tolerance)  . Normocytic anemia  . Junctional bradycardia    History  Smoking status  . Never Smoker   Smokeless tobacco  . Never Used    History  Alcohol Use No    Family History  Problem Relation Age of Onset  . Heart attack Mother   . Addison's disease Father   . Heart attack Sister   . Anesthesia problems Neg Hx   . Hypotension Neg Hx   . Malignant hyperthermia Neg Hx   . Pseudochol deficiency Neg Hx     Review of Systems: Constitutional: no fever chills diaphoresis or fatigue or change in weight.  Head and neck: no hearing loss, no epistaxis, no photophobia or visual disturbance. Respiratory: No cough, shortness of breath or wheezing. Cardiovascular: No chest pain peripheral edema, palpitations. Gastrointestinal: No abdominal distention, no abdominal pain, no change in bowel habits hematochezia or melena. Genitourinary: No dysuria, no frequency, no urgency, no nocturia. Musculoskeletal:No arthralgias, no back pain, no gait disturbance or myalgias. Neurological: No dizziness, no headaches, no numbness, no seizures, no syncope, no weakness, no tremors. Hematologic: No lymphadenopathy, no easy bruising. Psychiatric: No confusion, no hallucinations, no sleep disturbance.    Physical Exam: Filed Vitals:   11/29/12 1138  BP: 150/71  Pulse: 59   the general appearance reveals a thin elderly gentleman in no acute distress.The head and neck exam reveals pupils equal and reactive.  Extraocular movements are full.  There is no scleral icterus.  The mouth and pharynx are  normal.  The neck is supple.  The carotids reveal no bruits.  The jugular venous pressure is normal.  The  thyroid is not enlarged.  There is no lymphadenopathy.  The chest is clear to percussion and auscultation.  There are no rales or rhonchi.  Expansion of the chest is symmetrical.  The precordium is quiet.  The first heart sound is normal.  The second heart sound is paradoxically split.  There is no murmur gallop rub or click.  There is no abnormal lift or heave.  The abdomen is soft and nontender.  The bowel sounds are normal.  The liver  and spleen are not enlarged.  There are no abdominal masses.  There are no abdominal bruits.  Extremities reveal good pedal pulses.  There is no phlebitis or edema.  There is no cyanosis or clubbing.  Strength is normal and symmetrical in all extremities.  There is no lateralizing weakness.  There are no sensory deficits.  The skin is warm and dry.  There is no rash.  EKG shows sinus bradycardia and left bundle branch block with right axis deviation   Assessment / Plan:  Continue same medication.  Keep appointment already scheduled for early June.  We will repeat his EKG then.  He will get a two-dimensional echocardiogram prior to his June visit.  His last ejection fraction by echo on 07/18/12 was 40-45%

## 2012-11-29 NOTE — Assessment & Plan Note (Signed)
EKG today shows normal sinus rhythm with left bundle branch block and right axis deviation.  Patient has not been having any Stokes-Adams attacks.

## 2012-11-29 NOTE — Assessment & Plan Note (Signed)
The patient continues to be limited in terms of how active he can be because of his angina pectoris.  This morning he had angina which was relieved after a total of 3 nitroglycerin.  The patient will continue on his current medical regimen.

## 2012-11-29 NOTE — Patient Instructions (Addendum)
Your physician recommends that you continue on your current medications as directed. Please refer to the Current Medication list given to you today.  Keep your June appointment. Will have you get 2 D Echo before that office visit  Your physician has requested that you have an echocardiogram. Echocardiography is a painless test that uses sound waves to create images of your heart. It provides your doctor with information about the size and shape of your heart and how well your heart's chambers and valves are working. This procedure takes approximately one hour. There are no restrictions for this procedure.

## 2012-11-29 NOTE — Assessment & Plan Note (Signed)
The patient reports that when he was hospitalized at the [facility] the physicians noted a positive hepatojugular reflux.  That is not present on today's exam and his weight is unchanged at 165 pounds.  He appears to be euvolemic today

## 2012-12-05 ENCOUNTER — Encounter: Payer: Self-pay | Admitting: Cardiology

## 2012-12-08 ENCOUNTER — Telehealth: Payer: Self-pay | Admitting: *Deleted

## 2012-12-08 NOTE — Telephone Encounter (Signed)
F/u    Pt calling because he still has not recv'd records that he requested

## 2012-12-08 NOTE — Telephone Encounter (Signed)
Spoke with patient and advised mailed again

## 2012-12-12 ENCOUNTER — Emergency Department (HOSPITAL_COMMUNITY)
Admission: EM | Admit: 2012-12-12 | Discharge: 2012-12-12 | Disposition: A | Discharge status: Home / Self Care | Payer: Medicare Other | Attending: Emergency Medicine | Admitting: Emergency Medicine

## 2012-12-12 ENCOUNTER — Encounter (HOSPITAL_COMMUNITY): Payer: Self-pay | Admitting: Emergency Medicine

## 2012-12-12 DIAGNOSIS — Z7902 Long term (current) use of antithrombotics/antiplatelets: Secondary | ICD-10-CM | POA: Insufficient documentation

## 2012-12-12 DIAGNOSIS — I5020 Unspecified systolic (congestive) heart failure: Secondary | ICD-10-CM | POA: Insufficient documentation

## 2012-12-12 DIAGNOSIS — F329 Major depressive disorder, single episode, unspecified: Secondary | ICD-10-CM | POA: Insufficient documentation

## 2012-12-12 DIAGNOSIS — Z8619 Personal history of other infectious and parasitic diseases: Secondary | ICD-10-CM | POA: Insufficient documentation

## 2012-12-12 DIAGNOSIS — F3289 Other specified depressive episodes: Secondary | ICD-10-CM | POA: Insufficient documentation

## 2012-12-12 DIAGNOSIS — R109 Unspecified abdominal pain: Secondary | ICD-10-CM | POA: Insufficient documentation

## 2012-12-12 DIAGNOSIS — I2510 Atherosclerotic heart disease of native coronary artery without angina pectoris: Secondary | ICD-10-CM | POA: Insufficient documentation

## 2012-12-12 DIAGNOSIS — Z8669 Personal history of other diseases of the nervous system and sense organs: Secondary | ICD-10-CM | POA: Insufficient documentation

## 2012-12-12 DIAGNOSIS — Z8679 Personal history of other diseases of the circulatory system: Secondary | ICD-10-CM | POA: Insufficient documentation

## 2012-12-12 DIAGNOSIS — Z9861 Coronary angioplasty status: Secondary | ICD-10-CM | POA: Insufficient documentation

## 2012-12-12 DIAGNOSIS — R3915 Urgency of urination: Secondary | ICD-10-CM | POA: Insufficient documentation

## 2012-12-12 DIAGNOSIS — Z951 Presence of aortocoronary bypass graft: Secondary | ICD-10-CM | POA: Insufficient documentation

## 2012-12-12 DIAGNOSIS — M549 Dorsalgia, unspecified: Secondary | ICD-10-CM | POA: Insufficient documentation

## 2012-12-12 DIAGNOSIS — Z923 Personal history of irradiation: Secondary | ICD-10-CM | POA: Insufficient documentation

## 2012-12-12 DIAGNOSIS — Z7982 Long term (current) use of aspirin: Secondary | ICD-10-CM | POA: Insufficient documentation

## 2012-12-12 DIAGNOSIS — Z8546 Personal history of malignant neoplasm of prostate: Secondary | ICD-10-CM | POA: Insufficient documentation

## 2012-12-12 DIAGNOSIS — R350 Frequency of micturition: Secondary | ICD-10-CM | POA: Insufficient documentation

## 2012-12-12 DIAGNOSIS — Z8739 Personal history of other diseases of the musculoskeletal system and connective tissue: Secondary | ICD-10-CM | POA: Insufficient documentation

## 2012-12-12 DIAGNOSIS — I10 Essential (primary) hypertension: Secondary | ICD-10-CM | POA: Insufficient documentation

## 2012-12-12 DIAGNOSIS — R5381 Other malaise: Secondary | ICD-10-CM | POA: Insufficient documentation

## 2012-12-12 DIAGNOSIS — Z8719 Personal history of other diseases of the digestive system: Secondary | ICD-10-CM | POA: Insufficient documentation

## 2012-12-12 DIAGNOSIS — J449 Chronic obstructive pulmonary disease, unspecified: Secondary | ICD-10-CM | POA: Insufficient documentation

## 2012-12-12 DIAGNOSIS — Z79899 Other long term (current) drug therapy: Secondary | ICD-10-CM | POA: Insufficient documentation

## 2012-12-12 DIAGNOSIS — E785 Hyperlipidemia, unspecified: Secondary | ICD-10-CM | POA: Insufficient documentation

## 2012-12-12 DIAGNOSIS — Z8701 Personal history of pneumonia (recurrent): Secondary | ICD-10-CM | POA: Insufficient documentation

## 2012-12-12 DIAGNOSIS — N419 Inflammatory disease of prostate, unspecified: Secondary | ICD-10-CM | POA: Insufficient documentation

## 2012-12-12 DIAGNOSIS — J4489 Other specified chronic obstructive pulmonary disease: Secondary | ICD-10-CM | POA: Insufficient documentation

## 2012-12-12 DIAGNOSIS — K219 Gastro-esophageal reflux disease without esophagitis: Secondary | ICD-10-CM | POA: Insufficient documentation

## 2012-12-12 DIAGNOSIS — Z862 Personal history of diseases of the blood and blood-forming organs and certain disorders involving the immune mechanism: Secondary | ICD-10-CM | POA: Insufficient documentation

## 2012-12-12 DIAGNOSIS — R011 Cardiac murmur, unspecified: Secondary | ICD-10-CM | POA: Insufficient documentation

## 2012-12-12 LAB — URINE MICROSCOPIC-ADD ON

## 2012-12-12 LAB — URINALYSIS, ROUTINE W REFLEX MICROSCOPIC
Glucose, UA: NEGATIVE mg/dL
Protein, ur: NEGATIVE mg/dL
Specific Gravity, Urine: 1.022 (ref 1.005–1.030)
pH: 7.5 (ref 5.0–8.0)

## 2012-12-12 LAB — BASIC METABOLIC PANEL
BUN: 20 mg/dL (ref 6–23)
Calcium: 10 mg/dL (ref 8.4–10.5)
GFR calc non Af Amer: 40 mL/min — ABNORMAL LOW (ref >90)
Glucose, Bld: 140 mg/dL — ABNORMAL HIGH (ref 70–99)

## 2012-12-12 LAB — CBC
MCH: 32.6 pg (ref 26.0–34.0)
MCHC: 35 g/dL (ref 30.0–36.0)
Platelets: 196 10*3/uL (ref 150–400)

## 2012-12-12 MED ORDER — CIPROFLOXACIN HCL 500 MG PO TABS: 500.0000 mg | ORAL_TABLET | Freq: Two times a day (BID) | ORAL | Status: DC

## 2012-12-12 MED ORDER — CIPROFLOXACIN HCL 500 MG PO TABS
500.0000 mg | ORAL_TABLET | Freq: Once | ORAL | Status: AC
  Administered 2012-12-12: 500 mg via ORAL
  Filled 2012-12-12: qty 1

## 2012-12-12 MED ORDER — OXYCODONE-ACETAMINOPHEN 5-325 MG PO TABS: 1.0000 | ORAL_TABLET | Freq: Three times a day (TID) | ORAL | Status: DC | PRN

## 2012-12-12 MED ORDER — OXYCODONE-ACETAMINOPHEN 5-325 MG PO TABS
1.0000 | ORAL_TABLET | Freq: Once | ORAL | Status: AC
  Administered 2012-12-12: 1 via ORAL
  Filled 2012-12-12: qty 1

## 2012-12-12 NOTE — ED Notes (Signed)
Pt. States that this past Saturday he started having pain in his suprapubic and lower back that felt like spasms. He put heating pad on his groin and drank water and took some meds and it helped and felt better Sunday. It started again yesterday and he again used heating pad and drank fluids. He has a hx. Of Prostrate Ca. They did radiation only 48yrs ago.

## 2012-12-12 NOTE — ED Provider Notes (Signed)
History     CSN: [id number]  Arrival date & time 12/12/12  0609   First MD Initiated Contact with Patient 12/12/12 [phone]      Chief Complaint  Patient presents with  . Prostate Check    (Consider location/radiation/quality/duration/timing/severity/associated sxs/prior treatment) Patient is a 79 y.o. male presenting with dysuria. The history is provided by the patient. No language interpreter was used.  Dysuria  This is a recurrent problem. The current episode started 2 days ago. The problem occurs every urination. The problem has been gradually worsening. The quality of the pain is described as burning. The pain is at a severity of 5/10. The pain is moderate. There has been no fever. There is no history of pyelonephritis. Associated symptoms include frequency and urgency. Pertinent negatives include no chills, no sweats, no nausea, no vomiting, no discharge, no hematuria, no hesitancy and no flank pain. He has tried home medications and acetaminophen (heatng pad and increased fluid intake) for the symptoms. Past medical history comments: prostatitis, UTI.    Past Medical History  Diagnosis Date  . Systolic CHF     EF 40-45% by echo 07/18/12  . Dyslipidemia (high LDL; low HDL)   . Hypertension   . Coronary artery disease     s/p CABG; Anterolateral STEMI 06/2012 s/p BMS to mid LAD   . Heart murmur   . Peripheral vascular disease     thrombus- in leg- many yrs. ago- ?R- coumadin x1 mth  . Pneumonia     hosp. long time ago   . COPD (chronic obstructive pulmonary disease)     dc'd on home O2 06/2012  . Blood transfusion     post CABG  . Hepatitis     hepatitis- long time ago, occupational contamination   . GERD (gastroesophageal reflux disease)   . Cancer     prostate - Ca- radiation therapy- 2002  . Peripheral neuropathy   . Hiatal hernia   . Arthritis     hnp, lumbar, ankle (foot drop-R), wears a splint, arthritis - "all over", low bone density   . Depression     uses  Zoloft   . VT (ventricular tachycardia) 06/2012    in the setting of STEMI  . Bacteremia 06/2012    in the setting of UTI/Prostatits, Blood Cx E.Coli tx w/ Bactrim  . HTN (hypertension)   . Anemia   . Glucose intolerance (impaired glucose tolerance) 06/2012    A1C 5.8  . Prostatitis     Past Surgical History  Procedure Laterality Date  . Coronary artery bypass graft  1986 and 1993  . Ptca  2008  . Cardiac catheterization      2010- last cath, two stents at that time  . Eye surgery      cataracts - bilateral , IOL  . Hernia repair      bilateral inguiinal repair   . Tonsillectomy    . Fracture surgery      heel- crushed -2002,  (hardware)   . Joint replacement      R knee- arthroscopic, x2, R great toe- 2 surgeries- /w hardware & thenlater fused by Dr. Lajoyce Corners    . Appendectomy    . Rhinoplasty      as a teen, followed by same procedure at 1970's   . Lumbar laminectomy/decompression microdiscectomy  08/10/2011    Procedure: LUMBAR LAMINECTOMY/DECOMPRESSION MICRODISCECTOMY;  Surgeon: Kathaleen Maser Pool;  Location: [facility];  Service: Neurosurgery;  Laterality: Right;  RIGHT Lumbar  five-sacral one LAMINECTOMY, MICRODISCECTOMY  . Lumbar wound debridement  08/22/2011    Procedure: LUMBAR WOUND DEBRIDEMENT;  Surgeon: Tia Alert;  Location: [facility];  Service: Neurosurgery;  Laterality: N/A;  Repair of CSF Leak requiring laminectomy  . Coronary angioplasty with stent placement  X 4    Family History  Problem Relation Age of Onset  . Heart attack Mother   . Addison's disease Father   . Heart attack Sister   . Anesthesia problems Neg Hx   . Hypotension Neg Hx   . Malignant hyperthermia Neg Hx   . Pseudochol deficiency Neg Hx     History  Substance Use Topics  . Smoking status: Never Smoker   . Smokeless tobacco: Never Used  . Alcohol Use: No      Review of Systems  Constitutional: Positive for fatigue. Negative for fever, chills, activity change and appetite change.   HENT: Negative for congestion, sore throat, rhinorrhea, neck pain and neck stiffness.   Eyes: Negative for visual disturbance.  Respiratory: Negative for cough, choking, shortness of breath and wheezing.   Cardiovascular: Negative for chest pain, palpitations and leg swelling.  Gastrointestinal: Positive for abdominal pain. Negative for nausea, vomiting, constipation and abdominal distention.  Endocrine: Negative for polyuria.  Genitourinary: Positive for dysuria, urgency and frequency. Negative for hesitancy, hematuria, flank pain, decreased urine volume, discharge and penile pain.  Musculoskeletal: Positive for back pain (dull aching). Negative for myalgias and arthralgias.  Skin: Negative for pallor, rash and wound.  Neurological: Negative for dizziness, tremors, weakness and light-headedness.  Psychiatric/Behavioral: Negative for sleep disturbance (last 2 days secondary to discomfort).    Allergies  Review of patient's allergies indicates no known allergies.  Home Medications   Current Outpatient Rx  Name  Route  Sig  Dispense  Refill  . aspirin 81 MG tablet   Oral   Take 1 tablet (81 mg total) by mouth daily.   30 tablet   6   . atorvastatin (LIPITOR) 80 MG tablet   Oral   Take 1 tablet (80 mg total) by mouth daily.   30 tablet   6   . budesonide-formoterol (SYMBICORT) 80-4.5 MCG/ACT inhaler   Inhalation   Inhale 2 puffs into the lungs 2 (two) times daily.         . calcium-vitamin D (OSCAL WITH D 500-200) 500-200 MG-UNIT per tablet   Oral   Take 1 tablet by mouth 2 (two) times daily.           . carbamazepine (CARBATROL) 300 MG 12 hr capsule   Oral   Take 300 mg by mouth 2 (two) times daily.           . clopidogrel (PLAVIX) 75 MG tablet   Oral   Take 75 mg by mouth daily.           . finasteride (PROSCAR) 5 MG tablet   Oral   Take 5 mg by mouth daily.           . furosemide (LASIX) 40 MG tablet   Oral   Take 1 tablet (40 mg total) by mouth  daily.   30 tablet   6   . ipratropium (ATROVENT HFA) 17 MCG/ACT inhaler   Inhalation   Inhale 2 puffs into the lungs as needed.         . metoprolol succinate (TOPROL-XL) 25 MG 24 hr tablet   Oral   Take 1 tablet (25 mg total) by mouth daily.  30 tablet   6   . nitroGLYCERIN (NITROSTAT) 0.4 MG SL tablet   Sublingual   Place 0.4 mg under the tongue every 5 (five) minutes as needed. For chest pain         . potassium chloride SA (K-DUR,KLOR-CON) 20 MEQ tablet   Oral   Take 1 tablet (20 mEq total) by mouth daily.   30 tablet   6   . ranitidine (ZANTAC) 150 MG capsule   Oral   Take 150 mg by mouth daily.         . sertraline (ZOLOFT) 100 MG tablet   Oral   Take 150 mg by mouth daily.          . temazepam (RESTORIL) 15 MG capsule   Oral   Take 15 mg by mouth at bedtime as needed. For sleep         . isosorbide mononitrate (IMDUR) 60 MG 24 hr tablet   Oral   Take 1.5 tablets (90 mg total) by mouth daily.   60 tablet   6     Dispense as written.     BP 133/60  Pulse 62  Temp(Src) 97.6 F (36.4 C) (Oral)  SpO2 98%  Physical Exam  Nursing note and vitals reviewed. Constitutional: He is oriented to person, place, and time. He appears well-nourished. He is cooperative.  Non-toxic appearance. He has a sickly appearance. He does not appear ill. No distress. Nasal cannula in place.  Pt appears chronically ill but not acutely toxic.  HENT:  Head: Normocephalic and atraumatic.  Right Ear: External ear normal.  Left Ear: External ear normal.  Nose: Nose normal.  Mouth/Throat: Oropharynx is clear and moist. No oropharyngeal exudate.  Eyes: Conjunctivae are normal. Pupils are equal, round, and reactive to light. Right eye exhibits no discharge. Left eye exhibits no discharge. No scleral icterus.  Neck: Normal range of motion. Neck supple. No JVD present. No tracheal deviation present.  Cardiovascular: Normal rate, regular rhythm and intact distal pulses.  Exam  reveals no gallop and no friction rub.   Murmur heard. Pulmonary/Chest: Effort normal and breath sounds normal. No stridor. No respiratory distress. He has no wheezes. He has no rales. He exhibits no tenderness.  Abdominal: Soft. Normal appearance. He exhibits no distension, no ascites, no pulsatile midline mass and no mass. There is tenderness in the suprapubic area. There is no rigidity, no rebound, no guarding, no CVA tenderness, no tenderness at McBurney's point and negative Murphy's sign. No hernia.  Musculoskeletal: Normal range of motion. He exhibits no edema and no tenderness.  Lymphadenopathy:    He has no cervical adenopathy.  Neurological: He is alert and oriented to person, place, and time. No cranial nerve deficit.  Skin: Skin is warm and dry. No rash noted. He is not diaphoretic. No erythema. No pallor.  Psychiatric: He has a normal mood and affect. His behavior is normal.    ED Course  Procedures (including critical care time)  Labs Reviewed  URINALYSIS, ROUTINE W REFLEX MICROSCOPIC - Abnormal; Notable for the following:    Color, Urine AMBER (*)    APPearance HAZY (*)    Hgb urine dipstick SMALL (*)    Leukocytes, UA SMALL (*)    All other components within normal limits  URINE MICROSCOPIC-ADD ON - Abnormal; Notable for the following:    Bacteria, UA FEW (*)    Casts HYALINE CASTS (*)    Crystals CA OXALATE CRYSTALS (*)    All  other components within normal limits  URINE CULTURE  URINE CULTURE  BASIC METABOLIC PANEL  CBC   No results found.   No diagnosis found.    MDM  Pt presents for evaluation of dysuria and "prostate pain".  He appears nontoxic, note stable VS, NAD.  He has a hx of recurrent prostatitis.  Will obtain basic labs and a urinalysis + culture.   1100.  Pt stable, NAD.  Pain is improved.  Pt's H&P point towards prostatitis even if the urinalysis is not consistent with a UTI.   Will treat with a prolonged course of ciprofloxacin.  Encouraged  close outpt f/u with his PMD and urologist.  Discussed indications for immediate return to the ER.      Tobin Chad, MD 12/12/12 [phone]

## 2012-12-13 ENCOUNTER — Ambulatory Visit: Payer: Medicare Other | Admitting: Internal Medicine

## 2012-12-14 ENCOUNTER — Ambulatory Visit: Payer: Medicare Other | Admitting: Internal Medicine

## 2012-12-14 LAB — URINE CULTURE

## 2012-12-20 ENCOUNTER — Encounter: Payer: Self-pay | Admitting: Internal Medicine

## 2012-12-20 ENCOUNTER — Ambulatory Visit (INDEPENDENT_AMBULATORY_CARE_PROVIDER_SITE_OTHER): Payer: Medicare Other | Admitting: Internal Medicine

## 2012-12-20 VITALS — BP 110/60 | HR 54 | Temp 97.6°F | Ht 70.0 in | Wt 167.0 lb

## 2012-12-20 DIAGNOSIS — J45909 Unspecified asthma, uncomplicated: Secondary | ICD-10-CM

## 2012-12-20 NOTE — Progress Notes (Signed)
Subjective:     Patient ID: [name], male   DOB: 1924/09/07   MRN: [id number]  HPI  72 yowm never smoker carries dx of copd since around 2010 but pft's normalized p saba 09/12/12  MRN: [id number], DOB/AGE: May 30, 1925 79 y.o.  Primary MD: Evette Georges, MD Primary Cardiologist: Dr. Patty Sermons  Admit date: 07/15/2012  D/C date: 07/22/2012   Primary Discharge Diagnoses:  1. Acute anterolateral STEMI  - s/p BMS to mid LAD 07/15/12  2. Ventricular Tachycardia  - s/p defib/amio, likely ischemic, quiescent s/p revascularization.  3. Acute systolic CHF  - EF 40-45% by echo 07/18/12  - ACEI not initiated due to soft BP, Will need to start as outpatient  4. Escherichia coli bacteremia/SIRS  - likely secondary to UTI/prostatitis  - To complete 2 weeks of Bactrim (08/02/12)  - F/u w/ Urologist Dr. Mena Goes  5. Hypertension  6. Head laceration 2/2 fall  s/p sutures/stables in ED 07/15/12 - will need removal of sutures 7-10 days (11/23-11/26)  7. COPD  - continue inhaled corticosteroid/Spiriva, plan outpatient PFTs, cardiopulm rehab, and home oxygen.  8. Anemia  9. Transaminitis  - felt due to MI.  10. Hyperlipidemia  11. Glucose Intolerance  - A1c 5.8, f/u w/ PCP  12. Hypokalemia, resolved  07/26/2012 post hosp f/u newly on 02 2lpm 24 h per day.  Main issue is weak.  Previous to hosp atrovent prn > discharge on dulera and spiriva cc doe x 50 ft even on 02. rec Symbicort 80 Take 2 puffs first thing in am and then another 2 puffs about 12 hours later (ok to use up  the 160 strength but probably not needed in your case)  Ok to use atrovent if needed but if you learn to use symbicort effectively you won't find you need much atrovent You do not have significant copd and you never will as you never smoked and your lung function normalizes after bronchodilators. Work on inhaler technique  12/20/2012 f/u ov/[facility] re asthma Chief Complaint  Patient presents with  . Follow-up    Breathing  is unchanged, has only needed rescue inhaler maybe once or twice per wk   slt hoarse with spring allergies on zantac q am    No obvious daytime variabilty or assoc chronic cough or cp or chest tightness, subjective wheeze overt sinus or hb symptoms. No unusual exp hx or h/o childhood pna/ asthma or premature birth to his knowledge.   Sleeping ok without nocturnal  or early am exacerbation  of respiratory  c/o's or need for noct saba. Also denies any obvious fluctuation of symptoms with weather or environmental changes or other aggravating or alleviating factors except as outlined above  ROS  The following are not active complaints unless bolded sore throat, dysphagia, dental problems, itching, sneezing,  nasal congestion or excess/ purulent secretions, ear ache,   fever, chills, sweats, unintended wt loss, pleuritic or exertional cp, hemoptysis,  orthopnea pnd or leg swelling, presyncope, palpitations, heartburn, abdominal pain, anorexia, nausea, vomiting, diarrhea  or change in bowel or urinary habits, change in stools or urine, dysuria,hematuria,  rash, arthralgias, visual complaints, headache, numbness weakness or ataxia or problems with walking or coordination,  change in mood/affect or memory.           Objective:   Physical Exam    12/20/2012     167 Wt Readings from Last 3 Encounters:  07/26/12 167 lb 3.2 oz (75.841 kg)  07/25/12 167 lb (75.751 kg)  07/25/12  165 lb (74.844 kg)    HEENT: nl dentition, turbinates, and orophanx. Nl external ear canals without cough reflex   NECK :  without JVD/Nodes/TM/ nl carotid upstrokes bilaterally   LUNGS: no acc muscle use, clear to A and P bilaterally without cough on insp or exp maneuvers   CV:  RRR  no s3 or murmur or increase in P2, no edema   ABD:  soft and nontender with nl excursion in the supine position. No bruits or organomegaly, bowel sounds nl  MS:  warm without deformities, calf tenderness, cyanosis or clubbing       09/12/12 cxr Mild cardiac silhouette enlargement. Hyperinflation consistent  with COPD. No pulmonary edema, pneumonia, or pleural effusion is  evident.    Assessment:          Plan:

## 2012-12-20 NOTE — Patient Instructions (Addendum)
Zantac 150 mg after breakfast and bedtime  Continue 02 2lpm 24/7   Please schedule a follow up visit in 3 months but call sooner if needed with cxr on return

## 2012-12-21 ENCOUNTER — Ambulatory Visit (INDEPENDENT_AMBULATORY_CARE_PROVIDER_SITE_OTHER): Payer: Medicare Other | Admitting: Family Medicine

## 2012-12-21 ENCOUNTER — Encounter: Payer: Self-pay | Admitting: Family Medicine

## 2012-12-21 VITALS — BP 110/80 | Temp 97.7°F | Wt 167.0 lb

## 2012-12-21 DIAGNOSIS — I2510 Atherosclerotic heart disease of native coronary artery without angina pectoris: Secondary | ICD-10-CM

## 2012-12-21 DIAGNOSIS — I10 Essential (primary) hypertension: Secondary | ICD-10-CM

## 2012-12-21 DIAGNOSIS — R7401 Elevation of levels of liver transaminase levels: Secondary | ICD-10-CM

## 2012-12-21 DIAGNOSIS — R7402 Elevation of levels of lactic acid dehydrogenase (LDH): Secondary | ICD-10-CM

## 2012-12-21 NOTE — Progress Notes (Signed)
  Subjective:    Patient ID: [name], male    DOB: 09/06/1924, 79 y.o.   MRN: [id number]  HPI  [name] is an 79 year old male married nonsmoker retired from [employer] hospital who comes in today for followup of multiple issues. He's had a bad 4-5 months with hospitalizations for cardiac arrhythmias. He's had urinary tract infections respiratory therapy a number of life-threatening issues that fortunately he has recovered from. We reviewed all his problems in detail including all his medications.  His BP is 110/80 and he is lightheaded when he stands up. He does have a 4. Walker. Advised to cut his Toprol back to 12.5 mg daily  He is on 2 L of oxygen by nasal cannula and saw his pulmonologist recently. Pulse ox on room air 95% He's concerned about diabetes. Fasting blood sugar 140 however his A1c was 6.0%  At the end of the visit time he expressed a desire to have antibiotics called in over the phone if he needs them. He states that Dr. Aldean Ast used to do this before he retired. Prior to falling and having all these problems he had urinary tract symptoms. He went to the [facility]rology Center but was not able to see the doctor. He saw a nurse practitioner who checked his urine set was okay and didn't give him any medication!!!!!!!!!!!!!!!! Review of Systems    review of systems otherwise negative he is being cared for by his wife Objective:   Physical Exam  Thin male no acute distress BP right arm sitting position 110/80  I reviewed his entire medical records complications labs etc.      Assessment & Plan:  Hypertension and ischemic heart disease cardiomyopathy plan decrease Toprol to 12.5 mg a day continue all other medications  Recent urinary tract infection Cipro 1 daily until bottle empty

## 2012-12-21 NOTE — Patient Instructions (Signed)
Decrease the Toprol to 12.5 mg daily  Take one Cipro tablet daily until the bottle is empty  Return when necessary

## 2012-12-22 ENCOUNTER — Encounter: Payer: Self-pay | Admitting: Internal Medicine

## 2012-12-22 NOTE — Assessment & Plan Note (Signed)
-   Spirometry 07/26/2012  1.77 (69%) ratio 65    - HFA 90% p coaching 09/12/2012     - PFT's 09/12/2012 FEV1 1.96 (79%) ratio 64 normalized p B2 and DLCO 94%  All goals of chronic asthma control met including optimal function and elimination of symptoms with minimal need for rescue therapy.  Contingencies discussed in full including contacting this office immediately if not controlling the symptoms using the rule of two's but note he's using atrovent as an effective rescue which is unusual and may suggest some of his symptoms are actually cardiac asthma which traditionally responds better to atrovent than albuterol.  Needs to maintain however on symbicort for now and f/u in 3 m

## 2012-12-26 ENCOUNTER — Other Ambulatory Visit: Payer: Self-pay | Admitting: *Deleted

## 2012-12-26 DIAGNOSIS — G4700 Insomnia, unspecified: Secondary | ICD-10-CM

## 2012-12-26 NOTE — Telephone Encounter (Signed)
Patient left message to have Temazepam refilled at [facility] pharmacy.

## 2012-12-27 MED ORDER — TEMAZEPAM 15 MG PO CAPS: 15.0000 mg | ORAL_CAPSULE | Freq: Every evening | ORAL | Status: DC | PRN

## 2012-12-28 ENCOUNTER — Telehealth: Payer: Self-pay | Admitting: Cardiology

## 2013-01-29 ENCOUNTER — Ambulatory Visit (HOSPITAL_COMMUNITY): Payer: Medicare Other | Attending: Cardiology | Admitting: Radiology

## 2013-01-29 DIAGNOSIS — I447 Left bundle-branch block, unspecified: Secondary | ICD-10-CM | POA: Insufficient documentation

## 2013-01-29 DIAGNOSIS — I2589 Other forms of chronic ischemic heart disease: Secondary | ICD-10-CM

## 2013-01-29 DIAGNOSIS — I255 Ischemic cardiomyopathy: Secondary | ICD-10-CM

## 2013-01-29 NOTE — Progress Notes (Signed)
Echocardiogram performed.  

## 2013-01-31 ENCOUNTER — Telehealth: Payer: Self-pay | Admitting: Internal Medicine

## 2013-01-31 NOTE — Telephone Encounter (Signed)
I called and spoke with pt and advised him he will have CXR done day of his visit. Advised him to arrive 10 min prior to appt to have this done. Nothing further was needed

## 2013-02-06 ENCOUNTER — Encounter: Payer: Self-pay | Admitting: Cardiology

## 2013-02-06 ENCOUNTER — Ambulatory Visit (INDEPENDENT_AMBULATORY_CARE_PROVIDER_SITE_OTHER): Payer: Medicare Other | Admitting: Cardiology

## 2013-02-06 ENCOUNTER — Other Ambulatory Visit (INDEPENDENT_AMBULATORY_CARE_PROVIDER_SITE_OTHER): Payer: Medicare Other

## 2013-02-06 VITALS — BP 132/72 | HR 57 | Ht 70.0 in | Wt 161.4 lb

## 2013-02-06 DIAGNOSIS — I259 Chronic ischemic heart disease, unspecified: Secondary | ICD-10-CM

## 2013-02-06 DIAGNOSIS — N419 Inflammatory disease of prostate, unspecified: Secondary | ICD-10-CM

## 2013-02-06 DIAGNOSIS — E785 Hyperlipidemia, unspecified: Secondary | ICD-10-CM

## 2013-02-06 LAB — CBC WITH DIFFERENTIAL/PLATELET
Basophils Relative: 1 % (ref 0.0–3.0)
Eosinophils Absolute: 0.2 10*3/uL (ref 0.0–0.7)
Eosinophils Relative: 3.5 % (ref 0.0–5.0)
Hemoglobin: 13 g/dL (ref 13.0–17.0)
Lymphocytes Relative: 27.5 % (ref 12.0–46.0)
MCHC: 33.6 g/dL (ref 30.0–36.0)
MCV: 100.7 fl — ABNORMAL HIGH (ref 78.0–100.0)
Neutro Abs: 3.3 10*3/uL (ref 1.4–7.7)
RBC: 3.83 Mil/uL — ABNORMAL LOW (ref 4.22–5.81)
WBC: 5.5 10*3/uL (ref 4.5–10.5)

## 2013-02-06 LAB — HEPATIC FUNCTION PANEL
ALT: 13 U/L (ref 0–53)
AST: 26 U/L (ref 0–37)
Albumin: 3.6 g/dL (ref 3.5–5.2)
Total Bilirubin: 0.7 mg/dL (ref 0.3–1.2)
Total Protein: 7.3 g/dL (ref 6.0–8.3)

## 2013-02-06 LAB — LIPID PANEL
Cholesterol: 179 mg/dL (ref 0–200)
LDL Cholesterol: 112 mg/dL — ABNORMAL HIGH (ref 0–99)
Triglycerides: 93 mg/dL (ref 0.0–149.0)

## 2013-02-06 LAB — BASIC METABOLIC PANEL
BUN: 17 mg/dL (ref 6–23)
Chloride: 104 mEq/L (ref 96–112)
Creatinine, Ser: 1 mg/dL (ref 0.4–1.5)
Glucose, Bld: 86 mg/dL (ref 70–99)

## 2013-02-06 NOTE — Patient Instructions (Addendum)
Will obtain labs today and call you with the results (BMET/LP/HFP/CBC)  Your physician recommends that you continue on your current medications as directed. Please refer to the Current Medication list given to you today.  Your physician recommends that you schedule a follow-up appointment in: 3 months with fasting labs (LP/BMET/HFP)

## 2013-02-06 NOTE — Assessment & Plan Note (Signed)
The patient continues to have limited exercise tolerance and he tires easily.  He has occasional chest discomfort for which he takes sublingual nitroglycerin.

## 2013-02-06 NOTE — Assessment & Plan Note (Signed)
Patient has hyperlipidemia.  He remains on Lipitor 80 mg daily.  He has not been having any myalgias from Lipitor.  We are checking lab work today

## 2013-02-06 NOTE — Progress Notes (Signed)
[name] Date of Birth:  [birth year] [facility] [address] [place], [place]  [address] [phone]         Fax   [phone]  History of Present Illness: 79 y.o. male who presented to [facility] on 07/15/12 after falling at home with subsequent head laceration. While in the ED he developed chest pain followed by VT requiring cardioversion and an EKG showing anterolateral ST elevation. Earlier that week he was diagnosed with prostatitis and was febrile at home with temp up to 103. [name] He complained of chest pain for which a repeat EKG was obtained showing ST elevation in 1, aVL, V2-3. He developed sustained VT without a pulse requiring cardioversion and amiodarone with return of spontaneous circulation. He was taken emergently to the cath lab and treated with BMS to the mid LAD. He tolerated the procedure well without complications. He went to the ICU and was continued on IV amiodarone. Amiodarone was stopped and he remained without further VT. Echo showed EF 40-45%, mod LAE.  He has a history of extensive ischemic heart disease. He had coronary artery bypass graft surgery in 1986. He had a redo CABG in 1993. He had PTCA with stent in October 2008. He had catheter with a drug-eluting stent in February 2010. He had a small non-Q MI in August 2010. His last nuclear stress test was 10/27/09 showing a moderate area of reversible ischemia in the inferolateral wall with an ejection fraction of 64%.  Since last visit he has had no new cardiac symptoms.  He continues to be under a lot of emotional stress relating to a misbehaving  Grandson.  Current Outpatient Prescriptions  Medication Sig Dispense Refill  . aspirin 81 MG tablet Take 1 tablet (81 mg total) by mouth daily.  30 tablet  6  . atorvastatin (LIPITOR) 80 MG tablet Take 1 tablet (80 mg total) by mouth daily.  30 tablet  6  . budesonide-formoterol (SYMBICORT) 80-4.5 MCG/ACT inhaler Inhale 2 puffs into the lungs  2 (two) times daily.      . calcium-vitamin D (OSCAL WITH D 500-200) 500-200 MG-UNIT per tablet Take 1 tablet by mouth 2 (two) times daily.        . carbamazepine (CARBATROL) 300 MG 12 hr capsule Take 300 mg by mouth 2 (two) times daily.        . clopidogrel (PLAVIX) 75 MG tablet Take 75 mg by mouth daily.        . finasteride (PROSCAR) 5 MG tablet Take 5 mg by mouth daily.        . furosemide (LASIX) 40 MG tablet Take 1 tablet (40 mg total) by mouth daily.  30 tablet  6  . ipratropium (ATROVENT HFA) 17 MCG/ACT inhaler Inhale 2 puffs into the lungs as needed.      . isosorbide mononitrate (IMDUR) 60 MG 24 hr tablet Take 1.5 tablets (90 mg total) by mouth daily.  60 tablet  6  . metoprolol succinate (TOPROL-XL) 25 MG 24 hr tablet Take 1 tablet (25 mg total) by mouth daily.  30 tablet  6  . nitroGLYCERIN (NITROSTAT) 0.4 MG SL tablet Place 0.4 mg under the tongue every 5 (five) minutes as needed. For chest pain      . potassium chloride SA (K-DUR,KLOR-CON) 20 MEQ tablet Take 1 tablet (20 mEq total) by mouth daily.  30 tablet  6  . ranitidine (ZANTAC) 150 MG capsule Take 150 mg by mouth 2 (two) times  daily.       . sertraline (ZOLOFT) 100 MG tablet Take 150 mg by mouth daily.       . temazepam (RESTORIL) 15 MG capsule Take 1 capsule (15 mg total) by mouth at bedtime as needed. For sleep  30 capsule  5   No current facility-administered medications for this visit.    No Known Allergies  Patient Active Problem List   Diagnosis Date Noted  . Dizziness     Priority: High  . HYPERLIPIDEMIA 01/05/2007    Priority: Medium  . Cor Athrscl-Uns Vessel 01/05/2007    Priority: Medium  . Junctional bradycardia 07/25/2012  . Glucose intolerance (impaired glucose tolerance) 07/19/2012  . Normocytic anemia 07/19/2012  . SIRS (systemic inflammatory response syndrome) 07/17/2012  . Bacteremia due to Escherichia coli 07/17/2012  . Acute MI, anterolateral wall 07/16/2012  . Laceration of head 07/16/2012  .  Prostatitis 07/16/2012  . E. coli UTI (urinary tract infection) 07/16/2012  . Elevated transaminase level 07/16/2012  . Foot drop, right 07/28/2011  . LBBB (left bundle branch block) 03/29/2011  . Hx of CABG 03/29/2011  . Ischemic heart disease   . Myocardial infarction   . Acute systolic congestive heart failure, NYHA class 2-EF 40-45%   . Chronic respiratory failure   . Chronic asthma   . Peripheral neuropathy   . Dyslipidemia (high LDL; low HDL)   . PERIPHERAL NEUROPATHY 04/04/2007  . HYPERTENSION 04/04/2007  . PSORIASIS 04/04/2007  . OSTEOARTHRITIS 04/04/2007  . OSTEOPOROSIS 04/04/2007  . PROSTATE CANCER, HX OF 04/04/2007  . GERD 01/05/2007  . HERNIA 01/05/2007    History  Smoking status  . Never Smoker   Smokeless tobacco  . Never Used    History  Alcohol Use No    Family History  Problem Relation Age of Onset  . Heart attack Mother   . Addison's disease Father   . Heart attack Sister   . Anesthesia problems Neg Hx   . Hypotension Neg Hx   . Malignant hyperthermia Neg Hx   . Pseudochol deficiency Neg Hx     Review of Systems: Constitutional: no fever chills diaphoresis or fatigue or change in weight.  Head and neck: no hearing loss, no epistaxis, no photophobia or visual disturbance. Respiratory: No cough, shortness of breath or wheezing. Cardiovascular: No chest pain peripheral edema, palpitations. Gastrointestinal: No abdominal distention, no abdominal pain, no change in bowel habits hematochezia or melena. Genitourinary: No dysuria, no frequency, no urgency, no nocturia. Musculoskeletal:No arthralgias, no back pain, no gait disturbance or myalgias. Neurological: No dizziness, no headaches, no numbness, no seizures, no syncope, no weakness, no tremors. Hematologic: No lymphadenopathy, no easy bruising. Psychiatric: No confusion, no hallucinations, no sleep disturbance.    Physical Exam: Filed Vitals:   02/06/13 0904  BP: 132/72  Pulse: 57   the  general appearance reveals a well-developed elderly gentleman in no acute distress.The head and neck exam reveals pupils equal and reactive.  Extraocular movements are full.  There is no scleral icterus.  The mouth and pharynx are normal.  The neck is supple.  The carotids reveal no bruits.  The jugular venous pressure is normal.  The  thyroid is not enlarged.  There is no lymphadenopathy.  The chest is clear to percussion and auscultation.  There are no rales or rhonchi.  Expansion of the chest is symmetrical.  The precordium is quiet.  The first heart sound is normal.  The second heart sound is physiologically split.  There  is no murmur gallop rub or click.  There is no abnormal lift or heave.  The abdomen is soft and nontender.  The bowel sounds are normal.  The liver and spleen are not enlarged.  There are no abdominal masses.  There are no abdominal bruits.  Extremities reveal good pedal pulses.  There is no phlebitis or edema.  There is no cyanosis or clubbing.  Strength is normal and symmetrical in all extremities.  There is no lateralizing weakness.  There are no sensory deficits.  The skin is warm and dry.  There is no rash.  EKG shows sinus bradycardia with left bundle branch block  And rightward axis   Assessment / Plan:  Continue on same medication.  Continue current diet.  His weight is down 4 pounds.  Recheck in 3 months for followup office visit and fasting lab work

## 2013-02-06 NOTE — Assessment & Plan Note (Signed)
Patient had another episode of prostatitis about 5 weeks ago which responded to Cipro.

## 2013-02-07 NOTE — Progress Notes (Signed)
Quick Note:  Please report to patient. The recent labs are stable. Continue same medication and careful diet. ______ 

## 2013-02-08 ENCOUNTER — Telehealth: Payer: Self-pay | Admitting: *Deleted

## 2013-02-08 NOTE — Telephone Encounter (Signed)
Message copied by Burnell Blanks on Thu Feb 08, 2013 10:30 AM ------      Message from: [name]      Created: Wed Feb 07, 2013  9:35 PM       Please report to patient.  The recent labs are stable. Continue same medication and careful diet. ------

## 2013-02-08 NOTE — Telephone Encounter (Signed)
Advised patient of lab results  

## 2013-02-09 ENCOUNTER — Telehealth: Payer: Self-pay | Admitting: Cardiology

## 2013-02-09 NOTE — Telephone Encounter (Signed)
Follow Up ° ° °Pt calling to follow up on lab results. Please call. °

## 2013-02-09 NOTE — Telephone Encounter (Signed)
Patient unable to view labs in [place], mailed copy

## 2013-02-19 ENCOUNTER — Telehealth: Payer: Self-pay | Admitting: Family Medicine

## 2013-02-19 NOTE — Telephone Encounter (Signed)
Per Dr Tawanna Cooler patient to come in at 8:15 in the morning

## 2013-02-19 NOTE — Telephone Encounter (Signed)
Pt needs refill of ciprofloxacin (CIPRO) 500 MG tablet for his prostatitis.  Pt states Dr Tawanna Cooler is aware of his situation.  Left message on VM earlier.  Pharm: IT sales professional

## 2013-02-20 ENCOUNTER — Encounter: Payer: Self-pay | Admitting: Family Medicine

## 2013-02-20 ENCOUNTER — Ambulatory Visit (INDEPENDENT_AMBULATORY_CARE_PROVIDER_SITE_OTHER): Payer: Medicare Other | Admitting: Family Medicine

## 2013-02-20 VITALS — BP 140/70 | Temp 97.9°F | Wt 164.0 lb

## 2013-02-20 DIAGNOSIS — N419 Inflammatory disease of prostate, unspecified: Secondary | ICD-10-CM

## 2013-02-20 DIAGNOSIS — R300 Dysuria: Secondary | ICD-10-CM

## 2013-02-20 LAB — POCT URINALYSIS DIPSTICK
Bilirubin, UA: NEGATIVE
Glucose, UA: NEGATIVE
Ketones, UA: 15
Spec Grav, UA: 1.015

## 2013-02-20 MED ORDER — CIPROFLOXACIN HCL 500 MG PO TABS: 500.0000 mg | ORAL_TABLET | Freq: Two times a day (BID) | ORAL | Status: DC

## 2013-02-20 MED ORDER — OXYCODONE-ACETAMINOPHEN 5-325 MG PO TABS: 1.0000 | ORAL_TABLET | Freq: Three times a day (TID) | ORAL | Status: DC | PRN

## 2013-02-20 NOTE — Progress Notes (Signed)
  Subjective:    Patient ID: [name], male    DOB: [birth year], 79 y.o.   MRN: [id number]  HPI [name] is a 79 year old male married nonsmoker who comes in today with a one-day history of urinary frequency and prostate pain. He states he's had repeated problems like this in the past. His urologist was Dr. Aldean Ast. He would do a urine culture and start him on antibiotics and given something for pain.  He's had no fever chills urinalysis today shows small amount of blood otherwise normal  He had an episode like this about 6 weeks ago that got out of control and he end up being in the hospital with an Escherichia coli sepsis   Review of Systems    review of systems otherwise negative Objective:   Physical Exam Well-developed well-nourished male in no acute distress urinalysis normal except for small amount red cells       Assessment & Plan:  History of prostatitis and recent Escherichia coli urinary tract infection plan start on antibiotics culture urine Percocet for pain per patient request

## 2013-02-20 NOTE — Patient Instructions (Addendum)
Take the antibiotic as directed  Drink lots of water  Percocet 5 mg,,,,,,,, one half to one tablet 3 times daily as needed for pain  Return on Thursday for followup sooner if any problems

## 2013-02-22 ENCOUNTER — Ambulatory Visit (INDEPENDENT_AMBULATORY_CARE_PROVIDER_SITE_OTHER): Payer: Medicare Other | Admitting: Family Medicine

## 2013-02-22 ENCOUNTER — Encounter: Payer: Self-pay | Admitting: Family Medicine

## 2013-02-22 VITALS — BP 110/70 | Temp 98.1°F | Wt 165.0 lb

## 2013-02-22 DIAGNOSIS — N419 Inflammatory disease of prostate, unspecified: Secondary | ICD-10-CM

## 2013-02-22 DIAGNOSIS — R300 Dysuria: Secondary | ICD-10-CM

## 2013-02-22 LAB — POCT URINALYSIS DIPSTICK
Bilirubin, UA: NEGATIVE
Blood, UA: 1
Glucose, UA: NEGATIVE
Nitrite, UA: NEGATIVE
Urobilinogen, UA: 0.2

## 2013-02-22 LAB — URINE CULTURE
Colony Count: NO GROWTH
Organism ID, Bacteria: NO GROWTH

## 2013-02-22 NOTE — Progress Notes (Signed)
  Subjective:    Patient ID: [name], male    DOB: 04-Nov-1924, 79 y.o.   MRN: [id number]  HPI [name] is a 79 year old male who comes in today for followup of suspected prostatitis  He's had a history of recurrent prostatitis and indeed this spring he had an episode that required hospitalization. We saw him the other day with a 12 hour history of symptoms consistent with his previous history of prostatitis. However urine was normal we did start him on Cipro 500 twice a day and daily urine culture. Urine culture came back negative  He does not recall being told he has prostatodynia but that may be indeed what he is having now   Review of Systems Review of systems negative except for pain in his back and hips he sits in a recliner a lot and he said 2 lumbar disc surgeries. He says the Percocet helps the pain but he does and insomnia. He says switch off the Tylenol    Objective:   Physical Exam  Well-developed well-nourished in no acute distress urine culture negative      Assessment & Plan:  Probable prostatodynia plan taper Cipro slowly because of previous history of urinary tract infections

## 2013-02-22 NOTE — Patient Instructions (Signed)
Cipro 500 mg.......[name] 1 each bedtime for 1 week then stop  Return when necessary

## 2013-02-28 ENCOUNTER — Encounter (HOSPITAL_COMMUNITY): Payer: Self-pay | Admitting: Anesthesiology

## 2013-02-28 ENCOUNTER — Other Ambulatory Visit: Payer: Self-pay | Admitting: Urology

## 2013-02-28 ENCOUNTER — Inpatient Hospital Stay (HOSPITAL_COMMUNITY)
Admission: AD | Admit: 2013-02-28 | Discharge: 2013-03-01 | DRG: 694 | Disposition: A | Discharge status: Home / Self Care | Payer: Medicare Other | Source: Ambulatory Visit | Attending: Urology | Admitting: Urology

## 2013-02-28 ENCOUNTER — Encounter (HOSPITAL_COMMUNITY): Payer: Self-pay | Admitting: *Deleted

## 2013-02-28 ENCOUNTER — Encounter (HOSPITAL_COMMUNITY)
Admission: AD | Disposition: A | Discharge status: Home / Self Care | Payer: Self-pay | Source: Ambulatory Visit | Attending: Urology

## 2013-02-28 ENCOUNTER — Ambulatory Visit (HOSPITAL_COMMUNITY): Payer: Medicare Other | Admitting: Anesthesiology

## 2013-02-28 DIAGNOSIS — N201 Calculus of ureter: Principal | ICD-10-CM | POA: Diagnosis present

## 2013-02-28 DIAGNOSIS — R319 Hematuria, unspecified: Secondary | ICD-10-CM | POA: Diagnosis present

## 2013-02-28 DIAGNOSIS — N390 Urinary tract infection, site not specified: Secondary | ICD-10-CM | POA: Diagnosis present

## 2013-02-28 DIAGNOSIS — R509 Fever, unspecified: Secondary | ICD-10-CM | POA: Diagnosis present

## 2013-02-28 DIAGNOSIS — N1330 Unspecified hydronephrosis: Secondary | ICD-10-CM | POA: Diagnosis present

## 2013-02-28 HISTORY — PX: CYSTOSCOPY W/ URETERAL STENT PLACEMENT: SHX1429

## 2013-02-28 SURGERY — CYSTOSCOPY, WITH RETROGRADE PYELOGRAM AND URETERAL STENT INSERTION
Anesthesia: Monitor Anesthesia Care | Site: Ureter | Laterality: Right | Wound class: Clean Contaminated

## 2013-02-28 MED ORDER — FENTANYL CITRATE 0.05 MG/ML IJ SOLN: 25.0000 ug | INTRAMUSCULAR | Status: DC | PRN

## 2013-02-28 MED ORDER — BUDESONIDE-FORMOTEROL FUMARATE 80-4.5 MCG/ACT IN AERO
2.0000 | INHALATION_SPRAY | Freq: Two times a day (BID) | RESPIRATORY_TRACT | Status: DC
  Administered 2013-03-01: 2 via RESPIRATORY_TRACT
  Filled 2013-02-28: qty 6.9

## 2013-02-28 MED ORDER — CLOPIDOGREL BISULFATE 75 MG PO TABS
75.0000 mg | ORAL_TABLET | Freq: Every day | ORAL | Status: DC
  Administered 2013-03-01: 75 mg via ORAL
  Filled 2013-02-28 (×2): qty 1

## 2013-02-28 MED ORDER — PROPOFOL INFUSION 10 MG/ML OPTIME
INTRAVENOUS | Status: DC | PRN
  Administered 2013-02-28: 75 ug/kg/min via INTRAVENOUS

## 2013-02-28 MED ORDER — LACTATED RINGERS IV SOLN
INTRAVENOUS | Status: DC | PRN
  Administered 2013-02-28: 20:00:00 via INTRAVENOUS

## 2013-02-28 MED ORDER — LACTATED RINGERS IV SOLN: INTRAVENOUS | Status: DC

## 2013-02-28 MED ORDER — FUROSEMIDE 40 MG PO TABS
40.0000 mg | ORAL_TABLET | Freq: Every day | ORAL | Status: DC
  Administered 2013-03-01: 40 mg via ORAL
  Filled 2013-02-28: qty 1

## 2013-02-28 MED ORDER — NITROGLYCERIN 0.4 MG SL SUBL: 0.4000 mg | SUBLINGUAL_TABLET | SUBLINGUAL | Status: DC | PRN

## 2013-02-28 MED ORDER — SERTRALINE HCL 50 MG PO TABS
150.0000 mg | ORAL_TABLET | Freq: Every day | ORAL | Status: DC
  Administered 2013-03-01: 150 mg via ORAL
  Filled 2013-02-28: qty 1

## 2013-02-28 MED ORDER — ISOSORBIDE MONONITRATE ER 60 MG PO TB24
90.0000 mg | ORAL_TABLET | Freq: Every day | ORAL | Status: DC
  Administered 2013-03-01: 90 mg via ORAL
  Filled 2013-02-28: qty 1

## 2013-02-28 MED ORDER — TEMAZEPAM 15 MG PO CAPS
15.0000 mg | ORAL_CAPSULE | Freq: Every evening | ORAL | Status: DC | PRN
  Administered 2013-02-28: 15 mg via ORAL
  Filled 2013-02-28: qty 1

## 2013-02-28 MED ORDER — PROMETHAZINE HCL 25 MG/ML IJ SOLN: 6.2500 mg | INTRAMUSCULAR | Status: DC | PRN

## 2013-02-28 MED ORDER — SODIUM CHLORIDE 0.9 % IR SOLN
Status: DC | PRN
  Administered 2013-02-28: 1000 mL

## 2013-02-28 MED ORDER — FAMOTIDINE 20 MG PO TABS
20.0000 mg | ORAL_TABLET | Freq: Every day | ORAL | Status: DC
  Administered 2013-03-01: 20 mg via ORAL
  Filled 2013-02-28: qty 1

## 2013-02-28 MED ORDER — LIDOCAINE HCL 2 % EX GEL
CUTANEOUS | Status: DC | PRN
  Administered 2013-02-28: 1 via URETHRAL

## 2013-02-28 MED ORDER — KCL IN DEXTROSE-NACL 20-5-0.45 MEQ/L-%-% IV SOLN
INTRAVENOUS | Status: DC
  Administered 2013-02-28: via INTRAVENOUS
  Filled 2013-02-28 (×2): qty 1000

## 2013-02-28 MED ORDER — LIDOCAINE HCL 2 % EX GEL
CUTANEOUS | Status: AC
  Filled 2013-02-28: qty 10

## 2013-02-28 MED ORDER — METOPROLOL SUCCINATE ER 25 MG PO TB24
25.0000 mg | ORAL_TABLET | Freq: Every day | ORAL | Status: DC
  Administered 2013-03-01: 25 mg via ORAL
  Filled 2013-02-28: qty 1

## 2013-02-28 MED ORDER — CARBAMAZEPINE ER 100 MG PO TB12
300.0000 mg | ORAL_TABLET | Freq: Two times a day (BID) | ORAL | Status: DC
  Administered 2013-02-28 – 2013-03-01 (×2): 300 mg via ORAL
  Filled 2013-02-28 (×3): qty 3

## 2013-02-28 MED ORDER — IOHEXOL 300 MG/ML  SOLN
INTRAMUSCULAR | Status: AC
  Filled 2013-02-28: qty 1

## 2013-02-28 MED ORDER — ACETAMINOPHEN 325 MG PO TABS: 650.0000 mg | ORAL_TABLET | ORAL | Status: DC | PRN

## 2013-02-28 MED ORDER — SODIUM CHLORIDE 0.9 % IR SOLN
Status: DC | PRN
  Administered 2013-02-28: 3000 mL

## 2013-02-28 MED ORDER — POTASSIUM CHLORIDE CRYS ER 20 MEQ PO TBCR
20.0000 meq | EXTENDED_RELEASE_TABLET | Freq: Two times a day (BID) | ORAL | Status: DC
  Administered 2013-02-28 – 2013-03-01 (×2): 20 meq via ORAL
  Filled 2013-02-28 (×3): qty 1

## 2013-02-28 MED ORDER — CIPROFLOXACIN IN D5W 400 MG/200ML IV SOLN
400.0000 mg | INTRAVENOUS | Status: AC
  Administered 2013-02-28: 400 mg via INTRAVENOUS

## 2013-02-28 MED ORDER — FENTANYL CITRATE 0.05 MG/ML IJ SOLN
INTRAMUSCULAR | Status: DC | PRN
  Administered 2013-02-28 (×2): 25 ug via INTRAVENOUS

## 2013-02-28 MED ORDER — ASPIRIN EC 81 MG PO TBEC
81.0000 mg | DELAYED_RELEASE_TABLET | Freq: Every evening | ORAL | Status: DC
  Administered 2013-02-28: 81 mg via ORAL
  Filled 2013-02-28 (×2): qty 1

## 2013-02-28 MED ORDER — FINASTERIDE 5 MG PO TABS
5.0000 mg | ORAL_TABLET | Freq: Every day | ORAL | Status: DC
  Administered 2013-03-01: 5 mg via ORAL
  Filled 2013-02-28: qty 1

## 2013-02-28 MED ORDER — ATORVASTATIN CALCIUM 80 MG PO TABS
80.0000 mg | ORAL_TABLET | Freq: Every evening | ORAL | Status: DC
  Administered 2013-02-28: 80 mg via ORAL
  Filled 2013-02-28 (×2): qty 1

## 2013-02-28 MED ORDER — MIDAZOLAM HCL 5 MG/5ML IJ SOLN
INTRAMUSCULAR | Status: DC | PRN
  Administered 2013-02-28 (×2): 0.5 mg via INTRAVENOUS

## 2013-02-28 MED ORDER — OXYCODONE-ACETAMINOPHEN 5-325 MG PO TABS: 1.0000 | ORAL_TABLET | Freq: Three times a day (TID) | ORAL | Status: DC | PRN

## 2013-02-28 MED ORDER — KETAMINE HCL 10 MG/ML IJ SOLN
INTRAMUSCULAR | Status: DC | PRN
  Administered 2013-02-28 (×3): 5 mg via INTRAVENOUS

## 2013-02-28 MED ORDER — IOHEXOL 300 MG/ML  SOLN
INTRAMUSCULAR | Status: DC | PRN
  Administered 2013-02-28: 10 mL

## 2013-02-28 MED ORDER — DEXTROSE 5 % IV SOLN
1.0000 g | INTRAVENOUS | Status: DC
  Administered 2013-03-01: 1 g via INTRAVENOUS
  Filled 2013-02-28: qty 10

## 2013-02-28 MED ORDER — IPRATROPIUM BROMIDE HFA 17 MCG/ACT IN AERS
2.0000 | INHALATION_SPRAY | RESPIRATORY_TRACT | Status: DC | PRN
  Filled 2013-02-28: qty 12.9

## 2013-02-28 MED ORDER — CIPROFLOXACIN IN D5W 400 MG/200ML IV SOLN
INTRAVENOUS | Status: AC
  Filled 2013-02-28: qty 200

## 2013-02-28 SURGICAL SUPPLY — 19 items
ADAPTER CATH URET PLST 4-6FR (CATHETERS) ×3 IMPLANT
ADPR CATH URET STRL DISP 4-6FR (CATHETERS) ×2
BAG URO CATCHER STRL LF (DRAPE) ×3 IMPLANT
BASKET ZERO TIP NITINOL 2.4FR (BASKET) IMPLANT
BSKT STON RTRVL ZERO TP 2.4FR (BASKET)
CATH INTERMIT  6FR 70CM (CATHETERS) ×3 IMPLANT
CLOTH BEACON ORANGE TIMEOUT ST (SAFETY) ×3 IMPLANT
DRAPE CAMERA CLOSED 9X96 (DRAPES) ×3 IMPLANT
GLOVE BIOGEL M STRL SZ7.5 (GLOVE) ×3 IMPLANT
GLOVE SURG SS PI 8.5 STRL IVOR (GLOVE) ×3
GLOVE SURG SS PI 8.5 STRL STRW (GLOVE) ×3 IMPLANT
GOWN PREVENTION PLUS XLARGE (GOWN DISPOSABLE) ×3 IMPLANT
GOWN STRL NON-REIN LRG LVL3 (GOWN DISPOSABLE) ×4 IMPLANT
GUIDEWIRE ANG ZIPWIRE 038X150 (WIRE) IMPLANT
GUIDEWIRE STR DUAL SENSOR (WIRE) ×3 IMPLANT
MANIFOLD NEPTUNE II (INSTRUMENTS) ×3 IMPLANT
PACK CYSTO (CUSTOM PROCEDURE TRAY) ×3 IMPLANT
STENT CONTOUR 6FRX24X.038 (STENTS) ×2 IMPLANT
TUBING CONNECTING 10 (TUBING) ×3 IMPLANT

## 2013-02-28 NOTE — H&P (Signed)
Reason For Visit     Seen today as a work-in for low grade fever, lower abdominal pain, and gross hematuria.   Active Problems Problems  1. Abdominal Pain Above The Pubic Area (Suprapubic) 789.09 2. Distal Ureteral Stone On The Right 592.1 3. Fever (Symptom) 780.60 4. Gross Hematuria 599.71 5. Hydronephrosis On The Right 591 6. Renal Insufficiency 593.9  History of Present Illness           79 YO male patient of Dr. Estil Daft seen today as a work-in for low grade fever, lower abdominal pain, and gross hematuria.  GU HX:  Hx of BPH and prostate cancer.   Underwent TURP x 2 and cancer found on second TURP. Completed EBXRT about 20 yrs ago. PSA 0.8 in 2009. Per pt PSA checked at [facility] and was 0.6  in 2011. PSA was 0.01 Apr 2011.   In 2009 he had UTI and microhematuria with slightly elevated PVR (110 ml). Cysto was normal apart from lateral lobe regrowth. Finasteride was started.    He was seen Apr 2012 and developed leakage without awareness, pain over bladder and uncomfortable urination (no burning). PVR 50 cc Apr 2012. Heating pad over bladder helps. Treated with Doxy and symptoms resolved.   Pt with MI and PTCA Feb 2010 with difficult foley.   Dec 2012 he had back surgery (L-spine), he was taken back to OR for a spinal leak.   Nov 2013 he developed fever and a UTI Nov 2013. He was treated with Bactrim, but says he never got it. He went to the ER and was admitted. He had to be resucitated and "shocked". He had another heart stent placed. He finished Cipro and than was started on further cephalexin.  12/13 renal/bladder US and cystoscopy given his recent severe UTI and microhematuria.   Renal ultrasound: The right kidney is normal in appearance with no mass or hydronephrosis. There were 2 small cysts about 2 cm each one in the right upper pole and one in the right lower pole. Similarly the left kidney had no mass or hydronephrosis there was a 2.7 x 3.35 cm cyst in the left lower pole.  The bladder appeared normal with a postvoid of 31 mL. No ureters were seen.  Interval HX: Today states he developed bladder pain and dysuria 1 week ago. Saw PCP 02/22/13 for suspected prostatitis and was emphrically started on Cipro 500 mg 1 po BID until culture complete. Urine culture: no growth; and was instructed to decrease Cipro to 1/day till complete.   Now with fever (100.0- 100.5) X 48 hrs, gross hematuria (flakes of blood in depends), and continual lower abdominal pain that has not improved with ABX. Is very fearful of UTI/prostatitis since recent MI/cardiac arrest in Nov with UTI/prostatitis. Denies n/v but has very poor appetite.   Past Medical History Problems  1. History of  Acute Bacterial Prostatitis 601.0 2. History of  Acute Myocardial Infarction V12.59 3. History of  Angina Pectoris 413.9 4. History of  Arthritis V13.4 5. History of  Cardiac Catheterization  (Diagnostic) 6. History of  Cath Stent Placement 7. History of  Epididymitis 604.90 8. History of  Excision Of Lesion Scalp 9. History of  Gross Hematuria 599.71 10. History of  Heart Disease 429.9 11. History of  Hepatitis 573.3 12. History of  Microscopic Hematuria 599.72 13. History of  Murmurs 785.2 14. History of  Osteopenia 733.90 15. History of  Osteoporosis 733.00 16. History of  Prostate Cancer V10.46 17. History  of  Rhythm Disorder 427.9 18. History of  Testicular Pain 608.9 19. History of  Visit For: Exam Following Radiotherapy V67.1  Surgical History Problems  1. History of  Ankle Repair 2. History of  Appendectomy 3. History of  CABG (CABG) V45.81 4. History of  Cath Stent Placement 5. History of  Hernia Repair 6. History of  Tonsillectomy 7. History of  Transurethral Resection Of Prostate (TURP)  Current Meds 1. Albuterol 90 MCG/ACT AERS; Therapy: (Recorded:06Dec2013) to 2. Aspirin 81 MG Oral Tablet Chewable; Therapy: (Recorded:06Dec2013) to 3. Atrovent AERS; Therapy: (Recorded:06Dec2013)  to 4. Calcium + D TABS; Therapy: (Recorded:06Dec2013) to 5. CarBAMazepine ER 300 MG Oral Capsule Extended Release 12 Hour; Therapy:  (Recorded:06Dec2013) to 6. Crestor 20 MG Oral Tablet; Therapy: (Recorded:08Aug2012) to 7. Dulera 100-5 MCG/ACT Inhalation Aerosol; Therapy: (Recorded:06Dec2013) to 8. Finasteride 5 MG Oral Tablet; Take 1 tablet daily; Therapy: 05Apr2010 to (Evaluate:06Aug2012);  Last Rx:12Aug2011 9. Furosemide 40 MG Oral Tablet; Therapy: (Recorded:06Dec2013) to 10. Glucosamine Chondroitin TABS; Therapy: (Recorded:06Dec2013) to 11. Isosorbide Mononitrate ER 60 MG Oral Tablet Extended Release 24 Hour; takes 1.5 tablets daily   (90mg ); Therapy: (Recorded:06Dec2013) to 12. Metoprolol Succinate ER 25 MG Oral Tablet Extended Release 24 Hour; Therapy:   (Recorded:06Dec2013) to 13. Nitroglycerin 0.4 MG SUBL; Therapy: (Recorded:06Dec2013) to 14. Plavix 75 MG Oral Tablet; Therapy: (Recorded:12Aug2011) to 15. Potassium Chloride Crys ER 20 MEQ Oral Tablet Extended Release; Therapy:   (Recorded:20Aug2013) to 16. Ranitidine TABS; Therapy: (Recorded:22Apr2009) to 17. Temazepam 15 MG Oral Capsule; Therapy: (Recorded:13Nov2008) to 18. Zoloft TABS; Therapy: (Recorded:12Aug2011) to  Allergies Medication  1. No Known Drug Allergies  Family History Problems  1. Maternal history of  Acute Myocardial Infarction V17.3 2. Maternal history of  Coronary Artery Disease 3. Fraternal history of  Coronary Artery Disease 4. Fraternal history of  Coronary Artery Disease 5. Maternal history of  Death In The Family Father died at 34- Addison's Disease 6. Fraternal history of  Death In The Family Mother died at 50- MI 39. Family history of  Family Health Status Number Of Children 1 son & 1 daughter 54. Fraternal history of  Prostate Cancer V16.42  Social History Problems  1. Marital History - Currently Married 2. Never A Smoker 3. Occupation: retired Denied  4. History of  Alcohol Use 5.  History of  Caffeine Use  Review of Systems Genitourinary, constitutional, skin, eye, otolaryngeal, hematologic/lymphatic, cardiovascular, pulmonary, endocrine, musculoskeletal, gastrointestinal, neurological and psychiatric system(s) were reviewed and pertinent findings if present are noted.  Genitourinary: dysuria.  Gastrointestinal: abdominal pain.  Constitutional: fever.    Vitals Vital Signs [Data Includes: Last 1 Day]  02Jul2014 03:13PM  BMI Calculated: 22.76 BSA Calculated: 1.89 Weight: 159 lb  Blood Pressure: 145 / 68 Temperature: 98.2 F Heart Rate: 60  Physical Exam Constitutional: Well nourished and well developed . No acute distress. The patient appears well hydrated.  ENT:. The ears and nose are normal in appearance.  Neck: The appearance of the neck is normal.  Pulmonary: No respiratory distress.  Cardiovascular: Heart rate and rhythm are normal. Wears continous O2  Abdomen: The abdomen is flat. Mild suprapubic tenderness is present and tenderness in the LLQ is present, but no tenderness in the RLQ. mild right CVA tenderness, but no left CVA tenderness. No hernias are palpable.  Rectal: Rectal exam demonstrates normal sphincter tone, the anus is normal on inspection. and no tenderness. The prostate is smooth and flat. The perineum is normal on inspection, no perineal tenderness.  Genitourinary: Examination of  the penis demonstrates a normal meatus. The scrotum is normal in appearance. The right testis is tender and atrophic, but palpably normal and not enlarged. The left testis is tender and atrophic, but normal and not enlarged.  Skin: Normal skin turgor and normal skin color and pigmentation.  Neuro/Psych:. Mood and affect are appropriate.    Results/Data Urine [Data Includes: Last 1 Day]   02Jul2014  COLOR YELLOW   APPEARANCE CLOUDY   SPECIFIC GRAVITY 1.025   pH 6.0   GLUCOSE NEG mg/dL  BILIRUBIN NEG   KETONE NEG mg/dL  BLOOD LARGE   PROTEIN TRACE mg/dL   UROBILINOGEN 0.2 mg/dL  NITRITE NEG   LEUKOCYTE ESTERASE MOD   SQUAMOUS EPITHELIAL/HPF RARE   WBC 7-10 WBC/hpf  RBC 11-20 RBC/hpf  BACTERIA MODERATE   CRYSTALS NONE SEEN   CASTS Hyaline casts noted    Old records or history reviewed: Reviewed OV notes from PCP.  The following images/tracing/specimen were independently visualized:  CT hematuria protocol: severe right hydronephrosis and hydroureter secondary to distal 11 mm UVJ calculus. No stranding noted to colon.  The following clinical lab reports were reviewed:  UA shows microscopic hematuria, WBC, and bacteria. Will obtain STAT BUN/creatine and CBC/diff.  C&S Selected Results  BUN & CREATININE 02Jul2014 03:30PM Jetta Lout  SPECIMEN TYPE: BLOOD   Test Name Result Flag Reference  CREATININE 1.40 mg/dL  8.29-5.62  BUN 23 mg/dL  1-30  Est GFR, African American 53 mL/min L   Est GFR, NonAfrican American 45 mL/min L   PERFORMED AT:        [facility]                      [address]                      [place], [place] [address]   THE ESTIMATED GFR IS A CALCULATION VALID FOR ADULTS (>=27 YEARS OLD) THAT USES THE CKD-EPI ALGORITHM TO ADJUST FOR AGE AND SEX. IT IS   NOT TO BE USED FOR CHILDREN, PREGNANT WOMEN, HOSPITALIZED PATIENTS,    PATIENTS ON DIALYSIS, OR WITH RAPIDLY CHANGING KIDNEY FUNCTION. ACCORDING TO THE NKDEP, EGFR >89 IS NORMAL, 60-89 SHOWS MILD IMPAIRMENT, 30-59 SHOWS MODERATE IMPAIRMENT, 15-29 SHOWS SEVERE IMPAIRMENT AND <15 IS ESRD.   CBC W/DIFF 02Jul2014 03:30PM Jetta Lout  SPECIMEN TYPE: BLOOD   Test Name Result Flag Reference  WBC 7.6 K/uL  4.0-10.5  RBC 3.53 MIL/uL L 4.22-5.81  HEMOGLOBIN 11.2 g/dL L 46.9-62.9  HEMATOCRIT 34.3 % L 39.0-52.0  MCV 97.2 fL  78.0-100.0  MCH 31.7 pg  26.0-34.0  MCHC 32.7 g/dL  52.8-41.3  RDW 24.4 %  11.5-15.5  PLATELET COUNT 326 K/uL  150-400  GRANULOCYTE % 77 %  43-77  LYMPH % 14 %  12-46  MONO % 9 %  3-12  ABSOLUTE GRAN 5.9 K/uL  1.7-7.7  ABSOLUTE  LYMPH 1.1 K/uL  0.7-4.0  ABSOLUTE MONO 0.6 K/uL  0.1-1.0  Criteria for review not met   Assessment Assessed  1. Gross Hematuria 599.71 2. Dysuria 788.1 3. Fever (Symptom) 780.60 4. Abdominal Pain Above The Pubic Area (Suprapubic) 789.09 5. Renal Insufficiency 593.9 6. Distal Ureteral Stone On The Right 592.1 7. Hydronephrosis On The Right 591  Plan  Fever (Symptom) (780.60)  1. VENIPUNCTURE  Done: 02Jul2014 Fever (Symptom) (780.60), Gross Hematuria (599.71)  2. BUN & CREATININE  Done: 02Jul2014 03:30PM 3. CBC W/DIFF  Done: 02Jul2014 03:30PM Gross  Hematuria (599.71)  4. AU CT-HEMATURIA PROTOCOL  Done: 02Jul2014 12:00AM Health Maintenance (V70.0)  5. UA With REFLEX  Done: 02Jul2014 03:00PM Prostatitis (601.9), Fever (Symptom) (780.60)  6. CefTRIAXone Sodium 1 GM Injection Solution Reconstituted; INJECT 1 GM Intramuscular; Done:  02Jul2014 03:31PM; Status: COMPLETE         Ceftriaxone 1 GM IM. STAT BUN/creatine, CBC/diff. Reculture urine again today. Discussed pt situation with both Dr.Eskridge and Dr. Laverle Patter. Both are in agreement with recent low grade fever despite ABX would recommend proceeding with cystoscopy, retrograde pyelogram, and placement of double J stent tonight urgently. Risks and benefits discussed with both pt and wife by Dr. Laverle Patter and they wish to proceed.   **Post CT pt had 1 episode of mild chest pain requiring Nitroglycerin 0.4 mg 1 tablet. Denies crushing chest pain, N/V, diaphoresis, or dizzines. Has chronic angina which this pain is consistent with.  URINE CULTURE  Status: In Progress - Specimen/Data Collected  Done: 02Jul2014 Ordered Today; For: Dysuria (788.1), Fever (Symptom) (780.60), Gross Hematuria (599.71); Ordered By: Melvia Heaps  Due: 04Jul2014 Marked Important; Last Updated By: Larena Sox   Discussion/Summary  cc: Dr. Jim Desanctis     Signatures Electronically signed by : Jetta Lout, Dyann Ruddle; Feb 28 2013  4:43PM

## 2013-02-28 NOTE — Anesthesia Preprocedure Evaluation (Addendum)
Anesthesia Evaluation  Patient identified by MRN, date of birth, ID band Patient awake    Reviewed: Allergy & Precautions, H&P , NPO status , Patient's Chart, lab work & pertinent test results  Airway Mallampati: II TM Distance: >3 FB Neck ROM: Full    Dental  (+) Teeth Intact and Dental Advisory Given   Pulmonary asthma , pneumonia -, COPD oxygen dependent,  breath sounds clear to auscultation  Pulmonary exam normal       Cardiovascular hypertension, Pt. on home beta blockers + CAD, + Past MI, + Cardiac Stents, + CABG, + Peripheral Vascular Disease and +CHF + dysrhythmias Ventricular Tachycardia + Valvular Problems/Murmurs Rhythm:Regular Rate:Normal + Systolic murmurs    Neuro/Psych Depression  Neuromuscular disease    GI/Hepatic hiatal hernia, GERD-  ,(+) Hepatitis -  Endo/Other  negative endocrine ROS  Renal/GU negative Renal ROS  negative genitourinary   Musculoskeletal negative musculoskeletal ROS (+)   Abdominal   Peds  Hematology negative hematology ROS (+)   Anesthesia Other Findings Patient indicates he required sublingual NTG X1 earlier today with relief of chest pain. Cardiologist indicates that patient is medical management only at this time.  Reproductive/Obstetrics                           Anesthesia Physical Anesthesia Plan  ASA: IV and emergent  Anesthesia Plan: MAC   Post-op Pain Management:    Induction: Intravenous  Airway Management Planned: Simple Face Mask  Additional Equipment:   Intra-op Plan:   Post-operative Plan:   Informed Consent: I have reviewed the patients History and Physical, chart, labs and discussed the procedure including the risks, benefits and alternatives for the proposed anesthesia with the patient or authorized representative who has indicated his/her understanding and acceptance.   Dental advisory given  Plan Discussed with:  CRNA  Anesthesia Plan Comments:        Anesthesia Quick Evaluation

## 2013-02-28 NOTE — Preoperative (Signed)
Beta Blockers   Reason not to administer Beta Blockers:Took Metoprolol this am. 

## 2013-02-28 NOTE — Op Note (Addendum)
Preoperative diagnosis:  1. Right ureteral stone 2. Fever   Postoperative diagnosis:  1. Right ureteral stone 2. Fever   Procedure:  1. Cystoscopy 2. Right ureteral stent placement (6 x 24)  3. Right retrograde pyelography with interpretation   Surgeon: Moody Bruins. M.D.  Anesthesia: IV sedation  Complications: None  Intraoperative findings: Right retrograde pyelography demonstrated a very dilated and tortuous ureter above the level of the obstructing stone without any obvious filling defects noted above the level of the stone.  The renal pelvis was not opacified as it could be visualized due to the CT contrast administered earlier and care was taken not to inject with too much pressure to avoid pyelovenous backflow in the face of infection.  EBL: Minimal  Specimens: None  Indication: [name] is a 79 y.o. patient with a large obstructing right ureteral calculus and low grade fever and bacteruria. After reviewing the management options for treatment, he elected to proceed with the above surgical procedure(s). We have discussed the potential benefits and risks of the procedure, side effects of the proposed treatment, the likelihood of the patient achieving the goals of the procedure, and any potential problems that might occur during the procedure or recuperation. Informed consent has been obtained.  Description of procedure:  The patient was taken to the operating room and IV sedation was administered.  The patient was placed in the dorsal lithotomy position, prepped and draped in the usual sterile fashion, and preoperative antibiotics were administered. A preoperative time-out was performed.   Cystourethroscopy was performed.  The patient's urethra was examined and was normal. The bladder was then systematically examined in its entirety. There was no evidence for any bladder tumors or stones.  There was severe edema of the right hemitrigone consistent with the  patients know distal ureteral stone.  The stone could not be seen.   Attention then turned to the right ureteral orifice and a ureteral catheter was used to intubate the ureteral orifice after a 0.38 sensor guidewire was able to be passed by the obstructing stone. The was gross purulent material seen from the ureteral orifice. The wire curled in the distal ureter.  Omnipaque contrast was injected through the ureteral catheter and a retrograde pyelogram was performed with findings as dictated above. The ureter was very tortuous with two large loops (one distal and one proximal). The ureteral catheter was advanced gradually over the wire until the ureter was straightened.  A 0.38 sensor guidewire was then advanced up the right ureter into the renal pelvis under fluoroscopic guidance.  The wire was then backloaded through the cystoscope and a ureteral stent was advance over the wire using Seldinger technique.  The stent was positioned appropriately under fluoroscopic and cystoscopic guidance.  The wire was then removed with an adequate stent curl noted in the renal pelvis as well as in the bladder.  The bladder was then emptied and the procedure ended.  The patient appeared to tolerate the procedure well and without complications.  The patient was able to be awakened and transferred to the recovery unit in satisfactory condition.    Moody Bruins MD

## 2013-02-28 NOTE — Transfer of Care (Signed)
Immediate Anesthesia Transfer of Care Note  Patient: [name]  Procedure(s) Performed: Procedure(s): CYSTOSCOPY WITH RETROGRADE PYELOGRAM/URETERAL STENT PLACEMENT (Right)  Patient Location: PACU  Anesthesia Type:MAC  Level of Consciousness: awake, alert , oriented and patient cooperative  Airway & Oxygen Therapy: Patient Spontanous Breathing and Patient connected to face mask oxygen  Post-op Assessment: Report given to PACU RN and Post -op Vital signs reviewed and stable  Post vital signs: Reviewed and stable  Complications: No apparent anesthesia complications

## 2013-03-01 ENCOUNTER — Encounter (HOSPITAL_COMMUNITY): Payer: Self-pay | Admitting: Urology

## 2013-03-01 LAB — BASIC METABOLIC PANEL
GFR calc Af Amer: 52 mL/min — ABNORMAL LOW (ref >90)
GFR calc non Af Amer: 45 mL/min — ABNORMAL LOW (ref >90)
Glucose, Bld: 105 mg/dL — ABNORMAL HIGH (ref 70–99)
Potassium: 3.9 mEq/L (ref 3.5–5.1)
Sodium: 139 mEq/L (ref 135–145)

## 2013-03-01 LAB — CBC
Hemoglobin: 10.1 g/dL — ABNORMAL LOW (ref 13.0–17.0)
RBC: 3.07 MIL/uL — ABNORMAL LOW (ref 4.22–5.81)

## 2013-03-01 MED ORDER — CEPHALEXIN 500 MG PO CAPS: 500.0000 mg | ORAL_CAPSULE | Freq: Two times a day (BID) | ORAL | Status: DC

## 2013-03-01 NOTE — Progress Notes (Signed)
If patient remains stable, I will discharge him later today.

## 2013-03-01 NOTE — Discharge Summary (Signed)
Physician Discharge Summary  Patient ID: [name] MRN: [id number] DOB/AGE: [birth year] 79 y.o.  Admit date: 02/28/2013 Discharge date: 03/01/2013  Admission Diagnoses: Right ureteral stones Right hydronephrosis UTI  Discharge Diagnoses:  Right ureteral stones Right hydronephrosis UTI  Discharged Condition: good  Hospital Course: Pt admitted following stent and has remained stable. Labs improved. AF. No complaints this afternoon. Has some hematuria but urine is thin without clots and no issues with voiding.   Consults: None  Significant Diagnostic Studies: none   Treatments: cystoscopy, right ureteral stent placement  Discharge Exam: Blood pressure 106/48, pulse 55, temperature 98.1 F (36.7 C), temperature source Oral, resp. rate 18, height 5\' 10"  (1.778 m), weight 74.571 kg (164 lb 6.4 oz), SpO2 95.00%. NAD Watching TV Abd - soft, NT  Disposition: 01-Home or Self Care   Future Appointments Provider Department Dept Phone   03/26/2013 9:30 AM Nyoka Cowden, MD [facility] [phone]   05/03/2013 7:30 AM [facility] Main Office [place]) [phone]   05/09/2013 8:45 AM Cassell Clement, MD [facility] Main Office [place]) [phone]       Medication List         aspirin 81 MG tablet  Take 81 mg by mouth every evening.     atorvastatin 80 MG tablet  Commonly known as:  LIPITOR  Take 80 mg by mouth every evening.     budesonide-formoterol 80-4.5 MCG/ACT inhaler  Commonly known as:  SYMBICORT  Inhale 2 puffs into the lungs 2 (two) times daily.     calcium-vitamin D 500-200 MG-UNIT per tablet  Commonly known as:  OSCAL WITH D  Take 1 tablet by mouth 2 (two) times daily.     carbamazepine 300 MG 12 hr capsule  Commonly known as:  CARBATROL  Take 300 mg by mouth 2 (two) times daily.     cephALEXin 500 MG capsule  Commonly known as:  KEFLEX  Take 1 capsule (500 mg total) by mouth 2 (two) times daily.     ciprofloxacin 500 MG tablet  Commonly known as:  CIPRO  Take 1 tablet (500 mg total) by mouth 2 (two) times daily.     clopidogrel 75 MG tablet  Commonly known as:  PLAVIX  Take 75 mg by mouth daily.     finasteride 5 MG tablet  Commonly known as:  PROSCAR  Take 5 mg by mouth daily.     furosemide 40 MG tablet  Commonly known as:  LASIX  Take 1 tablet (40 mg total) by mouth daily.     ipratropium 17 MCG/ACT inhaler  Commonly known as:  ATROVENT HFA  Inhale 2 puffs into the lungs as needed.     isosorbide mononitrate 60 MG 24 hr tablet  Commonly known as:  IMDUR  Take 1.5 tablets (90 mg total) by mouth daily.     metoprolol succinate 25 MG 24 hr tablet  Commonly known as:  TOPROL-XL  Take 1 tablet (25 mg total) by mouth daily.     nitroGLYCERIN 0.4 MG SL tablet  Commonly known as:  NITROSTAT  Place 0.4 mg under the tongue every 5 (five) minutes as needed. For chest pain     oxyCODONE-acetaminophen 5-325 MG per tablet  Commonly known as:  ROXICET  Take 1 tablet by mouth every 8 (eight) hours as needed for pain.     potassium chloride SA 20 MEQ tablet  Commonly known as:  K-DUR,KLOR-CON  Take 20 mEq by mouth 2 (two) times  daily.     ranitidine 150 MG capsule  Commonly known as:  ZANTAC  Take 150 mg by mouth 2 (two) times daily.     sertraline 100 MG tablet  Commonly known as:  ZOLOFT  Take 150 mg by mouth daily.     temazepam 15 MG capsule  Commonly known as:  RESTORIL  Take 1 capsule (15 mg total) by mouth at bedtime as needed. For sleep         Signed: Antony Haste 03/01/2013, 3:27 PM

## 2013-03-05 NOTE — Anesthesia Postprocedure Evaluation (Signed)
Anesthesia Post Note  Patient: [name]  Procedure(s) Performed: Procedure(s) (LRB): CYSTOSCOPY WITH RETROGRADE PYELOGRAM/URETERAL STENT PLACEMENT (Right)  Anesthesia type: MAC  Patient location: PACU  Post pain: Pain level controlled  Post assessment: Post-op Vital signs reviewed  Last Vitals:  Filed Vitals:   03/01/13 1351  BP: 106/48  Pulse: 55  Temp: 36.7 C  Resp: 18    Post vital signs: Reviewed  Level of consciousness: sedated  Complications: No apparent anesthesia complications

## 2013-03-15 ENCOUNTER — Other Ambulatory Visit: Payer: Self-pay | Admitting: Urology

## 2013-03-19 ENCOUNTER — Encounter (HOSPITAL_COMMUNITY): Payer: Self-pay | Admitting: Pharmacy Technician

## 2013-03-20 ENCOUNTER — Encounter (HOSPITAL_COMMUNITY): Payer: Self-pay

## 2013-03-20 ENCOUNTER — Encounter (HOSPITAL_COMMUNITY)
Admission: RE | Admit: 2013-03-20 | Discharge: 2013-03-20 | Disposition: A | Discharge status: Home / Self Care | Payer: Medicare Other | Source: Ambulatory Visit | Attending: Urology | Admitting: Urology

## 2013-03-20 HISTORY — DX: Personal history of urinary calculi: Z87.442

## 2013-03-20 HISTORY — DX: Unspecified asthma, uncomplicated: J45.909

## 2013-03-20 LAB — BASIC METABOLIC PANEL
BUN: 13 mg/dL (ref 6–23)
CO2: 32 mEq/L (ref 19–32)
Calcium: 9.8 mg/dL (ref 8.4–10.5)
GFR calc non Af Amer: 75 mL/min — ABNORMAL LOW (ref >90)
Glucose, Bld: 101 mg/dL — ABNORMAL HIGH (ref 70–99)
Potassium: 4.3 mEq/L (ref 3.5–5.1)
Sodium: 140 mEq/L (ref 135–145)

## 2013-03-20 LAB — CBC
HCT: 36.7 % — ABNORMAL LOW (ref 39.0–52.0)
Hemoglobin: 12.2 g/dL — ABNORMAL LOW (ref 13.0–17.0)
MCH: 32.7 pg (ref 26.0–34.0)
MCHC: 33.2 g/dL (ref 30.0–36.0)
RBC: 3.73 MIL/uL — ABNORMAL LOW (ref 4.22–5.81)

## 2013-03-20 NOTE — Progress Notes (Signed)
Chest x-ray 09/14/12 on EPIC, EKG 02/06/13 on EPIC

## 2013-03-20 NOTE — Progress Notes (Signed)
03/20/13 0923  OBSTRUCTIVE SLEEP APNEA  Have you ever been diagnosed with sleep apnea through a sleep study? No  Do you snore loudly (loud enough to be heard through closed doors)?  0  Do you often feel tired, fatigued, or sleepy during the daytime? 1  Has anyone observed you stop breathing during your sleep? 0  Do you have, or are you being treated for high blood pressure? 1  BMI more than 35 kg/m2? 0  Age over 77 years old? 1  Neck circumference greater than 40 cm/18 inches? 0  Gender: 1  Obstructive Sleep Apnea Score 4  Score 4 or greater  Results sent to PCP

## 2013-03-20 NOTE — Patient Instructions (Addendum)
20 [name]  03/20/2013   Your procedure is scheduled on: 03/23/13  Report to [facility] at 8:45 AM.  Call this number if you have problems the morning of surgery [phone]: [phone]   Remember: please bring inhaler on day of surgery   Do not eat food or drink liquids After Midnight.     Take these medicines the morning of surgery with A SIP OF WATER: carbamazepine/ carbatrol, metoprolol, zoloft, zantac, inhaler if needed   Do not wear jewelry, make-up or nail polish.  Do not wear lotions, powders, or perfumes. You may wear deodorant.  Do not shave 48 hours prior to surgery. Men may shave face and neck.  Do not bring valuables to the hospital.     Patients discharged the day of surgery will not be allowed to drive home.  Name and phone number of your driver:[name] (wife) [phone]  Birdie Sons, RN  pre op nurse call if needed [phone]    FAILURE TO FOLLOW THESE INSTRUCTIONS MAY RESULT IN CANCELLATION OF YOUR SURGERY   Patient Signature: ___________________________________________

## 2013-03-22 NOTE — H&P (Signed)
History of Present Illness           F/u BPH, PCa, nephrolithiasis.   Nephrolithiasis -Jul 2014 abd pain, fever, gross hematuria Cysto.stent for large right distal stone and UTI. Home on cipro then a few days of cephalexin.     Prostate Ca Completed EBXRT about 20 yrs ago. Underwent TURP x 2 and cancer found on second TURP. PSA 0.8 in 2009 and DRE normal April 2012. Per pt PSA checked at [facility] and was 0.6  in 2011. PSA was 0.01 Apr 2011.   UTI -2009 he had UTI and microhematuria with slightly elevated PVR (110 ml). Cysto was normal apart from lateral lobe regrowth. Finasteride was started.  -Apr 2012 PVR 50 cc tx with doxy  -Nov 2013 he developed fever, UTI. Bactrim, but says he never got it. Admitted. He had to be resucitated and "shocked". He had another heart stent placed. He finished Cipro and I started him on further cephalexin. His BUN and Cr normal.  -Dec 2013 cystoscopy and Renal US normal.   Pt with MI and PTCA Feb 2010 with difficult foley.   Dec 2012 he had back surgery (L-spine), he was taken back to OR for a spinal leak.     Interaval Hx He returns and has had occasional hematuria. No dysuria or fever. He hasnt passed any stones. He feels a bit weak and wonders about his current  "hemoglobin" level.   KUB - right stent in good position, large stone adjacent to stent in distal ureter   Past Medical History Problems  1. History of  Acute Bacterial Prostatitis 601.0 2. History of  Acute Myocardial Infarction V12.59 3. History of  Acute Myocardial Infarction V12.59 4. History of  Angina Pectoris 413.9 5. History of  Arthritis V13.4 6. History of  Cardiac Catheterization  (Diagnostic) 7. History of  Cath Stent Placement 8. History of  Epididymitis 604.90 9. History of  Excision Of Lesion Scalp 10. History of  Gross Hematuria 599.71 11. History of  Heart Disease 429.9 12. History of  Hepatitis 573.3 13. History of  Microscopic Hematuria 599.72 14. History of  Murmurs  785.2 15. History of  Osteopenia 733.90 16. History of  Osteoporosis 733.00 17. History of  Prostate Cancer V10.46 18. History of  Rhythm Disorder 427.9 19. History of  Testicular Pain 608.9 20. History of  Visit For: Exam Following Radiotherapy V67.1  Surgical History Problems  1. History of  Ankle Repair 2. History of  Appendectomy 3. History of  CABG (CABG) V45.81 4. History of  Cath Stent Placement 5. History of  Cystoscopy With Insertion Of Ureteral Stent Right 6. History of  Hernia Repair 7. History of  Tonsillectomy 8. History of  Transurethral Resection Of Prostate (TURP)  Current Meds 1. Albuterol 90 MCG/ACT AERS; Therapy: (Recorded:06Dec2013) to 2. Aspirin 81 MG Oral Tablet Chewable; Therapy: (Recorded:06Dec2013) to 3. Calcium + D TABS; Therapy: (Recorded:06Dec2013) to 4. CarBAMazepine ER 300 MG Oral Capsule Extended Release 12 Hour; Therapy:  (Recorded:06Dec2013) to 5. Dulera 100-5 MCG/ACT Inhalation Aerosol; Therapy: (Recorded:06Dec2013) to 6. Finasteride 5 MG Oral Tablet; Take 1 tablet daily; Therapy: 05Apr2010 to (Evaluate:06Aug2012);  Last Rx:12Aug2011 7. Furosemide 40 MG Oral Tablet; Therapy: (Recorded:06Dec2013) to 8. Isosorbide Mononitrate ER 60 MG Oral Tablet Extended Release 24 Hour; takes 1.5 tablets daily  (90mg ); Therapy: (Recorded:06Dec2013) to 9. Lipitor 80 MG Oral Tablet; Therapy: (Recorded:02Jul2014) to 10. Metoprolol Succinate ER 25 MG Oral Tablet Extended Release 24 Hour; Therapy:   (Recorded:06Dec2013) to 11.  Nitroglycerin 0.4 MG SUBL; Therapy: (Recorded:06Dec2013) to 12. Oxygen Use; Therapy: (Recorded:02Jul2014) to 13. Plavix 75 MG Oral Tablet; Therapy: (Recorded:12Aug2011) to 14. Potassium Chloride Crys ER 20 MEQ Oral Tablet Extended Release; Therapy:   (Recorded:20Aug2013) to 15. Ranitidine TABS; Therapy: (Recorded:22Apr2009) to 16. Symbicort AERO; Therapy: (Recorded:02Jul2014) to 17. Temazepam 15 MG Oral Capsule; Therapy: (Recorded:13Nov2008)  to 18. Zoloft TABS; Therapy: (Recorded:12Aug2011) to  Allergies Medication  1. No Known Drug Allergies  Family History Problems  1. Maternal history of  Acute Myocardial Infarction V17.3 2. Maternal history of  Coronary Artery Disease 3. Fraternal history of  Coronary Artery Disease 4. Fraternal history of  Coronary Artery Disease 5. Maternal history of  Death In The Family Father died at 75- Addison's Disease 6. Fraternal history of  Death In The Family Mother died at 105- MI 61. Family history of  Family Health Status Number Of Children 1 son & 1 daughter 63. Fraternal history of  Prostate Cancer V16.42  Social History Problems  1. Marital History - Currently Married 2. Never A Smoker 3. Occupation: retired Denied  4. History of  Alcohol Use 5. History of  Caffeine Use  Vitals Vital Signs [Data Includes: Last 1 Day]  16Jul2014 02:09PM  Blood Pressure: 124 / 50 Temperature: 97.6 F Heart Rate: 59  Physical Exam Constitutional: Well nourished and well developed . No acute distress.  Pulmonary: No respiratory distress and normal respiratory rhythm and effort.  Cardiovascular: Heart rate and rhythm are normal . No peripheral edema.  Neuro/Psych:. Mood and affect are appropriate.    Results/Data Urine [Data Includes: Last 1 Day]   16Jul2014  COLOR RED   APPEARANCE CLOUDY   SPECIFIC GRAVITY 1.025   pH 6.5   GLUCOSE NEG mg/dL  BILIRUBIN MOD   KETONE TRACE mg/dL  BLOOD LARGE   PROTEIN > 300 mg/dL  UROBILINOGEN 1 mg/dL  NITRITE POS   LEUKOCYTE ESTERASE MOD   SQUAMOUS EPITHELIAL/HPF RARE   WBC 3-6 WBC/hpf  RBC TNTC RBC/hpf  BACTERIA FEW   CRYSTALS NONE SEEN   CASTS NEGATIVE    Assessment Assessed  1. Distal Ureteral Stone On The Right 592.1 2. Gross Hematuria 599.71  Plan Distal Ureteral Stone On The Right (592.1)  1. KUB  Done: 16Jul2014 12:00AM Gross Hematuria (599.71)  2. HEMOGLOBIN & HEMATOCRIT  Requested for: 16Jul2014 3. URINE CULTURE  Requested  for: 16Jul2014 4. Follow-up Schedule Surgery Office  Follow-up  Requested for: 16Jul2014 Health Maintenance (V70.0)  5. UA With REFLEX  Done: 16Jul2014 01:38PM  Discussion/Summary     Discussed KUB - . Discussed nature, risks, benefits of stent removal today or proceeding with left URS after checking urine Cx. He elects to proceed with URS. The stone is large and might not pass if we remove the stent.      Signatures Electronically signed by : Jerilee Field, M.D.; Mar 14 2013  3:32PM

## 2013-03-23 ENCOUNTER — Encounter (HOSPITAL_COMMUNITY): Payer: Self-pay | Admitting: Anesthesiology

## 2013-03-23 ENCOUNTER — Ambulatory Visit (HOSPITAL_COMMUNITY): Payer: Medicare Other

## 2013-03-23 ENCOUNTER — Ambulatory Visit (HOSPITAL_COMMUNITY): Payer: Medicare Other | Admitting: Anesthesiology

## 2013-03-23 ENCOUNTER — Encounter (HOSPITAL_COMMUNITY): Payer: Self-pay | Admitting: *Deleted

## 2013-03-23 ENCOUNTER — Encounter (HOSPITAL_COMMUNITY)
Admission: RE | Disposition: A | Discharge status: Home / Self Care | Payer: Self-pay | Source: Ambulatory Visit | Attending: Urology

## 2013-03-23 ENCOUNTER — Ambulatory Visit (HOSPITAL_COMMUNITY)
Admission: RE | Admit: 2013-03-23 | Discharge: 2013-03-23 | Disposition: A | Discharge status: Home / Self Care | Payer: Medicare Other | Source: Ambulatory Visit | Attending: Urology | Admitting: Urology

## 2013-03-23 DIAGNOSIS — Z9861 Coronary angioplasty status: Secondary | ICD-10-CM | POA: Insufficient documentation

## 2013-03-23 DIAGNOSIS — N201 Calculus of ureter: Secondary | ICD-10-CM | POA: Insufficient documentation

## 2013-03-23 DIAGNOSIS — I252 Old myocardial infarction: Secondary | ICD-10-CM | POA: Insufficient documentation

## 2013-03-23 DIAGNOSIS — N400 Enlarged prostate without lower urinary tract symptoms: Secondary | ICD-10-CM | POA: Insufficient documentation

## 2013-03-23 DIAGNOSIS — Z79899 Other long term (current) drug therapy: Secondary | ICD-10-CM | POA: Insufficient documentation

## 2013-03-23 DIAGNOSIS — Z87440 Personal history of urinary (tract) infections: Secondary | ICD-10-CM | POA: Insufficient documentation

## 2013-03-23 DIAGNOSIS — Z8546 Personal history of malignant neoplasm of prostate: Secondary | ICD-10-CM | POA: Insufficient documentation

## 2013-03-23 DIAGNOSIS — M810 Age-related osteoporosis without current pathological fracture: Secondary | ICD-10-CM | POA: Insufficient documentation

## 2013-03-23 DIAGNOSIS — Z951 Presence of aortocoronary bypass graft: Secondary | ICD-10-CM | POA: Insufficient documentation

## 2013-03-23 HISTORY — PX: CYSTOSCOPY WITH URETEROSCOPY AND STENT PLACEMENT: SHX6377

## 2013-03-23 HISTORY — PX: HOLMIUM LASER APPLICATION: SHX5852

## 2013-03-23 SURGERY — CYSTOURETEROSCOPY, WITH STENT INSERTION
Anesthesia: Monitor Anesthesia Care | Site: Ureter | Laterality: Right | Wound class: Clean Contaminated

## 2013-03-23 MED ORDER — MIDAZOLAM HCL 5 MG/5ML IJ SOLN
INTRAMUSCULAR | Status: DC | PRN
  Administered 2013-03-23 (×2): 0.5 mg via INTRAVENOUS

## 2013-03-23 MED ORDER — FENTANYL CITRATE 0.05 MG/ML IJ SOLN
INTRAMUSCULAR | Status: AC
  Filled 2013-03-23: qty 2

## 2013-03-23 MED ORDER — CEFAZOLIN SODIUM-DEXTROSE 2-3 GM-% IV SOLR
INTRAVENOUS | Status: AC
  Filled 2013-03-23: qty 50

## 2013-03-23 MED ORDER — LIDOCAINE HCL 2 % EX GEL
CUTANEOUS | Status: DC | PRN
  Administered 2013-03-23: 1

## 2013-03-23 MED ORDER — CEFAZOLIN SODIUM-DEXTROSE 2-3 GM-% IV SOLR
2.0000 g | INTRAVENOUS | Status: AC
  Administered 2013-03-23: 2 g via INTRAVENOUS

## 2013-03-23 MED ORDER — SODIUM CHLORIDE 0.9 % IR SOLN
Status: DC | PRN
  Administered 2013-03-23: 1000 mL

## 2013-03-23 MED ORDER — PROMETHAZINE HCL 25 MG/ML IJ SOLN: 6.2500 mg | INTRAMUSCULAR | Status: DC | PRN

## 2013-03-23 MED ORDER — CIPROFLOXACIN IN D5W 400 MG/200ML IV SOLN
INTRAVENOUS | Status: AC
  Filled 2013-03-23: qty 200

## 2013-03-23 MED ORDER — STERILE WATER FOR IRRIGATION IR SOLN
Status: DC | PRN
  Administered 2013-03-23: 3000 mL

## 2013-03-23 MED ORDER — KETAMINE HCL 10 MG/ML IJ SOLN
INTRAMUSCULAR | Status: DC | PRN
  Administered 2013-03-23 (×2): 5 mg via INTRAVENOUS
  Administered 2013-03-23: 10 mg via INTRAVENOUS

## 2013-03-23 MED ORDER — 0.9 % SODIUM CHLORIDE (POUR BTL) OPTIME
TOPICAL | Status: DC | PRN
  Administered 2013-03-23: 1000 mL

## 2013-03-23 MED ORDER — IOHEXOL 300 MG/ML  SOLN
INTRAMUSCULAR | Status: AC
  Filled 2013-03-23: qty 1

## 2013-03-23 MED ORDER — PROPOFOL INFUSION 10 MG/ML OPTIME
INTRAVENOUS | Status: DC | PRN
  Administered 2013-03-23: 75 ug/kg/min via INTRAVENOUS

## 2013-03-23 MED ORDER — LACTATED RINGERS IV SOLN: INTRAVENOUS | Status: DC

## 2013-03-23 MED ORDER — LIDOCAINE HCL 2 % EX GEL
CUTANEOUS | Status: AC
  Filled 2013-03-23: qty 10

## 2013-03-23 MED ORDER — LACTATED RINGERS IV SOLN
INTRAVENOUS | Status: DC
  Administered 2013-03-23: 1000 mL via INTRAVENOUS
  Administered 2013-03-23: 11:00:00 via INTRAVENOUS

## 2013-03-23 MED ORDER — FENTANYL CITRATE 0.05 MG/ML IJ SOLN
25.0000 ug | INTRAMUSCULAR | Status: DC | PRN
  Administered 2013-03-23: 50 ug via INTRAVENOUS

## 2013-03-23 SURGICAL SUPPLY — 24 items
BAG URO CATCHER STRL LF (DRAPE) ×2 IMPLANT
BASKET LASER NITINOL 1.9FR (BASKET) ×1 IMPLANT
BASKET STNLS GEMINI 4WIRE 3FR (BASKET) ×1 IMPLANT
BSKT STON RTRVL 120 1.9FR (BASKET) ×1
BSKT STON RTRVL GEM 120X11 3FR (BASKET) ×1
CATH URET 5FR 28IN CONE TIP (BALLOONS) ×1
CATH URET 5FR 28IN OPEN ENDED (CATHETERS) ×2 IMPLANT
CATH URET 5FR 70CM CONE TIP (BALLOONS) ×1 IMPLANT
CLOTH BEACON ORANGE TIMEOUT ST (SAFETY) ×2 IMPLANT
CONT SPEC 4OZ CLIKSEAL STRL BL (MISCELLANEOUS) ×2 IMPLANT
DRAPE CAMERA CLOSED 9X96 (DRAPES) ×2 IMPLANT
GLOVE BIOGEL M 8.0 STRL (GLOVE) ×2 IMPLANT
GLOVE BIOGEL M STRL SZ7.5 (GLOVE) ×2 IMPLANT
GOWN PREVENTION PLUS XLARGE (GOWN DISPOSABLE) ×2 IMPLANT
GOWN STRL REIN XL XLG (GOWN DISPOSABLE) ×2 IMPLANT
GUIDEWIRE STR DUAL SENSOR (WIRE) ×2 IMPLANT
INSTR STONE CONE (INSTRUMENTS) ×2
INSTRUMENT STONE CONE (INSTRUMENTS) IMPLANT
LASER FIBER DISP (UROLOGICAL SUPPLIES) ×1 IMPLANT
MANIFOLD NEPTUNE II (INSTRUMENTS) ×2 IMPLANT
PACK CYSTO (CUSTOM PROCEDURE TRAY) ×2 IMPLANT
STENT CONTOUR 6FRX24X.038 (STENTS) ×1 IMPLANT
TUBING CONNECTING 10 (TUBING) ×2 IMPLANT
WIRE COONS/BENSON .038X145CM (WIRE) ×2 IMPLANT

## 2013-03-23 NOTE — Progress Notes (Signed)
Patient states he has been having loose stools the last several days. None today

## 2013-03-23 NOTE — Anesthesia Postprocedure Evaluation (Signed)
Anesthesia Post Note  Patient: [name]  Procedure(s) Performed: Procedure(s) (LRB): RIGHT URETEROSCOPY, LASER LITHO AND STENT PLACEMENT (Right) HOLMIUM LASER APPLICATION (Right)  Anesthesia type: MAC  Patient location: PACU  Post pain: Pain level controlled  Post assessment: Post-op Vital signs reviewed  Last Vitals:  Filed Vitals:   03/23/13 1330  BP: 154/68  Pulse: 57  Temp: 36.6 C  Resp: 18    Post vital signs: Reviewed  Level of consciousness: sedated  Complications: No apparent anesthesia complications

## 2013-03-23 NOTE — Transfer of Care (Signed)
Immediate Anesthesia Transfer of Care Note  Patient: [name]  Procedure(s) Performed: Procedure(s): RIGHT URETEROSCOPY, LASER LITHO AND STENT PLACEMENT (Right) HOLMIUM LASER APPLICATION (Right)  Patient Location: PACU  Anesthesia Type:MAC  Level of Consciousness: awake and alert   Airway & Oxygen Therapy: Patient Spontanous Breathing and Patient connected to nasal cannula oxygen  Post-op Assessment: Report given to PACU RN and Post -op Vital signs reviewed and stable  Post vital signs: Reviewed and stable  Complications: No apparent anesthesia complications

## 2013-03-23 NOTE — Anesthesia Preprocedure Evaluation (Signed)
Anesthesia Evaluation  Patient identified by MRN, date of birth, ID band Patient awake    Reviewed: Allergy & Precautions, H&P , NPO status , Patient's Chart, lab work & pertinent test results  Airway Mallampati: II TM Distance: >3 FB Neck ROM: Full    Dental  (+) Teeth Intact and Dental Advisory Given   Pulmonary asthma , pneumonia -, COPD oxygen dependent,  breath sounds clear to auscultation  Pulmonary exam normal + decreased breath sounds      Cardiovascular hypertension, Pt. on home beta blockers + CAD, + Past MI, + Cardiac Stents, + CABG, + Peripheral Vascular Disease and +CHF + dysrhythmias Ventricular Tachycardia + Valvular Problems/Murmurs Rhythm:Regular Rate:Normal + Systolic murmurs    Neuro/Psych Depression  Neuromuscular disease    GI/Hepatic hiatal hernia, GERD-  ,(+) Hepatitis -  Endo/Other  negative endocrine ROS  Renal/GU negative Renal ROS  negative genitourinary   Musculoskeletal negative musculoskeletal ROS (+)   Abdominal   Peds  Hematology negative hematology ROS (+)   Anesthesia Other Findings Patient indicates he required sublingual NTG X1 earlier today with relief of chest pain. Cardiologist indicates that patient is medical management only at this time.  Reproductive/Obstetrics                           Anesthesia Physical Anesthesia Plan  ASA: IV  Anesthesia Plan: MAC   Post-op Pain Management:    Induction: Intravenous  Airway Management Planned: Simple Face Mask  Additional Equipment:   Intra-op Plan:   Post-operative Plan:   Informed Consent: I have reviewed the patients History and Physical, chart, labs and discussed the procedure including the risks, benefits and alternatives for the proposed anesthesia with the patient or authorized representative who has indicated his/her understanding and acceptance.   Dental advisory given  Plan Discussed with:  CRNA  Anesthesia Plan Comments:         Anesthesia Quick Evaluation

## 2013-03-23 NOTE — Interval H&P Note (Signed)
History and Physical Interval Note:  03/23/2013 10:05 AM  Jaymon A Weismann  has presented today for surgery, with the diagnosis of RIGHT URETERAL STONE  The various methods of treatment have been discussed with the patient and family. After consideration of risks, benefits and other options for treatment, the patient has consented to  Procedure(s): RIGHT URETEROSCOPY, LASER LITHO AND STENT PLACEMENT (Right) HOLMIUM LASER APPLICATION (Right) as a surgical intervention .  The patient's history has been reviewed, patient examined, no change in status, stable for surgery.  I have reviewed the patient's chart and labs.  Questions were answered to the patient's satisfaction.  He denies CP or SOB. Discussed risks of CV, pulm complications, ureteral injury among others. He elects to proceed. Discussed new stent with poss tether.  Antony Haste

## 2013-03-23 NOTE — Op Note (Signed)
Prep diagnosis: Right ureteral stone Postop diagnosis: Right ureteral stone  Procedure: Cystoscopy, right ureteroscopy, holmium laser lithotripsy, stone basket extraction, 6 x 24 cm ureteral stent placement  Surgeon: Mena Goes  Type of anesthesia: MAC Anesthesia: [place]  Findings: Large dense stone and right distal ureter. All fragments removed apart from some small less than a millimeter fragments.  Description of procedure: After consent was obtained patient was brought to the operating room. After adequate anesthesia the patient was placed in lithotomy position and prepped and draped in the usual sterile fashion. A timeout was performed to confirm the patient and procedure. Cystoscope was passed per urethra and the stent was grasped and removed through the urethral meatus and a sensor wire was advanced and coiled in the right collecting system. The bladder was drained and the scope removed. A rigid ureteroscope was advanced into the ureter where the stone was located. It was ensnared in a [employer] with the thought that the stone was longer and linear and the ureter was nicely dilated and it may be extractable but it still got hung up at the ureterovesical junction. Therefore I left it in the ureter and disassembled the basket. I passed the ureteroscope adjacent to the basket now serving as the wire and backstop and dusted the stone at a setting of 0.3 and 40. Now the basket disengaged and was removed intact. An escape basket was passed and some of the bigger fragments removed. Now all that remained was less than a millimeter dust. The patient is on Plavix and there were some mild bleeding and I was concerned about possible clotting and obstruction if I left a stent with string in the stent was actually pulled out. Therefore 6 x 24 stent was placed by backloaded and the wire on the cystoscope and the wire removed. A good coil seen in the kidney good coil in the bladder. I did cut the string off.  The patient was then awakened taken to recovery room stable condition.  EBL: Minimal Complications: None Specimens: Stone fragments given the patient Drains: 6 x 24 cm the right ureteral stent  Disposition: Patient stable to PACU l

## 2013-03-26 ENCOUNTER — Ambulatory Visit: Payer: Medicare Other | Admitting: Internal Medicine

## 2013-03-26 ENCOUNTER — Encounter (HOSPITAL_COMMUNITY): Payer: Self-pay | Admitting: Urology

## 2013-03-29 ENCOUNTER — Telehealth: Payer: Self-pay | Admitting: Internal Medicine

## 2013-03-29 DIAGNOSIS — R0602 Shortness of breath: Secondary | ICD-10-CM

## 2013-03-29 NOTE — Telephone Encounter (Signed)
Pt aware he will have CXR prior to OV and needs to arrive 10-15 min early prior to appt

## 2013-04-03 ENCOUNTER — Ambulatory Visit (INDEPENDENT_AMBULATORY_CARE_PROVIDER_SITE_OTHER)
Admission: RE | Admit: 2013-04-03 | Discharge: 2013-04-03 | Disposition: A | Discharge status: Home / Self Care | Payer: Medicare Other | Source: Ambulatory Visit | Attending: Internal Medicine | Admitting: Internal Medicine

## 2013-04-03 ENCOUNTER — Ambulatory Visit (INDEPENDENT_AMBULATORY_CARE_PROVIDER_SITE_OTHER): Payer: Medicare Other | Admitting: Internal Medicine

## 2013-04-03 ENCOUNTER — Encounter: Payer: Self-pay | Admitting: Internal Medicine

## 2013-04-03 VITALS — BP 144/70 | HR 57 | Temp 97.5°F | Ht 70.0 in | Wt 161.8 lb

## 2013-04-03 DIAGNOSIS — J9610 Chronic respiratory failure, unspecified whether with hypoxia or hypercapnia: Secondary | ICD-10-CM

## 2013-04-03 DIAGNOSIS — J4530 Mild persistent asthma, uncomplicated: Secondary | ICD-10-CM

## 2013-04-03 DIAGNOSIS — R0602 Shortness of breath: Secondary | ICD-10-CM

## 2013-04-03 DIAGNOSIS — J45909 Unspecified asthma, uncomplicated: Secondary | ICD-10-CM

## 2013-04-03 NOTE — Patient Instructions (Addendum)
Ok to adjust the 02 so saturation is over 90 - if not needing it at all during the day call for overnight test on Room Air  [phone] and ask Almyra Free  Continue symbicort 80 Take 2 puffs first thing in am and then another 2 puffs about 12 hours later.    If you are satisfied with your treatment plan let your doctor know and he/she can either refill your medications or you can return here when your prescription runs out.     If in any way you are not 100% satisfied,  please tell us.  If 100% better, tell your friends!

## 2013-04-03 NOTE — Assessment & Plan Note (Signed)
-   Spirometry 07/26/2012  1.77 (69%) ratio 65    - HFA 90% p coaching 09/12/2012     - PFT's 09/12/2012 FEV1 1.96 (79%) ratio 64 normalized p B2 and DLCO 94%  Adequate control on present rx, reviewed > no change in rx needed  But concerned some of his symptoms may be exac be eating suggesting either asp or GERD so if worsen would rec ST eval of swallowing and full barium swallow but not change in pulmonary rx.  Atrovent not usually a good 1st choice for asthma but actually effective in cardiac asthma so ok to continue prn or change to combivent as a  Rescue.

## 2013-04-03 NOTE — Assessment & Plan Note (Signed)
-   07/26/2012  Walked 2 lpm x 2 laps @ 185 ft each stopped due to weak> sob and  no desat - 09/12/2012  Walked 2lpm   2 laps @ 185 ft each stopped due to sob/ fatigue but sats still 97%   Adequate control on present rx, reviewed > no change in rx needed  - ok to titrate off as long as he monitors his daytime sats and if does fine then try the ono again on ra

## 2013-04-03 NOTE — Progress Notes (Signed)
Subjective:     Patient ID: [name], male   DOB: 06-04-1925   MRN: [id number]   Brief patient profile:  79 yowm never smoker carried dx of copd since around 2010 but pft's normalized p saba 09/12/12  MRN: [id number], DOB/AGE: 77-Feb-1926 77 y.o.  Primary MD: Shane Georges, MD Primary Cardiologist: Dr. Patty Sermons  Admit date: 07/15/2012  D/C date: 07/22/2012   Primary Discharge Diagnoses:  1. Acute anterolateral STEMI  - s/p BMS to mid LAD 07/15/12  2. Ventricular Tachycardia  - s/p defib/amio, likely ischemic, quiescent s/p revascularization.  3. Acute systolic CHF  - EF 40-45% by echo 07/18/12  - ACEI not initiated due to soft BP, Will need to start as outpatient  4. Escherichia coli bacteremia/SIRS  - likely secondary to UTI/prostatitis  - To complete 2 weeks of Bactrim (08/02/12)  - F/u w/ Urologist Dr. Mena Goes  5. Hypertension  6. Head laceration 2/2 fall  s/p sutures/stables in ED 07/15/12 - will need removal of sutures 7-10 days (11/23-11/26)  7. COPD  - continue inhaled corticosteroid/Spiriva, plan outpatient PFTs, cardiopulm rehab, and home oxygen.  8. Anemia  9. Transaminitis  - felt due to MI.  10. Hyperlipidemia  11. Glucose Intolerance  - A1c 5.8, f/u w/ PCP  12. Hypokalemia, resolved  07/26/2012 post hosp f/u newly on 02 2lpm 24 h per day.  Main issue is weak.  Previous to hosp atrovent prn > discharge on dulera and spiriva cc doe x 50 ft even on 02. rec Symbicort 80 Take 2 puffs first thing in am and then another 2 puffs about 12 hours later (ok to use up  the 160 strength but probably not needed in your case)  Ok to use atrovent if needed but if you learn to use symbicort effectively you won't find you need much atrovent You do not have significant copd and you never will as you never smoked and your lung function normalizes after bronchodilators. Work on inhaler technique  12/20/2012 f/u ov/Kemora Pinard re asthma Chief Complaint  Patient presents with  .  Follow-up    Breathing is unchanged, has only needed rescue inhaler maybe once or twice per wk   slt hoarse with spring allergies on zantac q am  rec Zantac 150 bid  04/03/2013 f/u ov/Davionna Blacksher re asthma Chief Complaint  Patient presents with  . 3 month follow up    with cxr.  Breathing has improved.  Wheezing and chest tightness at times.  Cough - prod at times.   cough seems worse immediately p eating,  Using atrovent once daily does help chest tightness and wheeze despite symbicort 80 2bid     No obvious daytime variabilty or assoc cp  overt sinus or hb symptoms. No unusual exp hx or h/o childhood pna/ asthma or premature birth to his knowledge.   Sleeping ok without nocturnal  or early am exacerbation  of respiratory  c/o's or need for noct saba. Also denies any obvious fluctuation of symptoms with weather or environmental changes or other aggravating or alleviating factors except as outlined above  ROS  The following are not active complaints unless bolded sore throat, dysphagia, dental problems, itching, sneezing,  nasal congestion or excess/ purulent secretions, ear ache,   fever, chills, sweats, unintended wt loss, pleuritic or exertional cp, hemoptysis,  orthopnea pnd or leg swelling, presyncope, palpitations, heartburn, abdominal pain, anorexia, nausea, vomiting, diarrhea  or change in bowel or urinary habits, change in stools or urine, dysuria,hematuria,  rash, arthralgias,  visual complaints, headache, numbness weakness or ataxia or problems with walking or coordination,  change in mood/affect or memory.           Objective:   Physical Exam    12/20/2012     167 > 04/03/2013  162  Wt Readings from Last 3 Encounters:  07/26/12 167 lb 3.2 oz (75.841 kg)  07/25/12 167 lb (75.751 kg)  07/25/12 165 lb (74.844 kg)    Hoarse amb wm ambulates with tri-wheeled walker  HEENT: nl dentition, turbinates, and orophanx. Nl external ear canals without cough reflex   NECK :  without  JVD/Nodes/TM/ nl carotid upstrokes bilaterally   LUNGS: no acc muscle use, clear to A and P bilaterally without cough on insp or exp maneuvers   CV:  RRR  no s3 or murmur or increase in P2, no edema   ABD:  soft and nontender with nl excursion in the supine position. No bruits or organomegaly, bowel sounds nl  MS:  warm without deformities, calf tenderness, cyanosis or clubbing      04/03/2013  No acute abnormality is noted.    Assessment:

## 2013-04-05 ENCOUNTER — Telehealth: Payer: Self-pay | Admitting: Internal Medicine

## 2013-04-05 ENCOUNTER — Encounter: Payer: Self-pay | Admitting: Cardiology

## 2013-04-05 ENCOUNTER — Ambulatory Visit: Payer: Medicare Other | Admitting: Internal Medicine

## 2013-04-05 DIAGNOSIS — J9610 Chronic respiratory failure, unspecified whether with hypoxia or hypercapnia: Secondary | ICD-10-CM

## 2013-04-05 NOTE — Telephone Encounter (Signed)
If wants to leave off at night then we need to repeat ono RA

## 2013-04-05 NOTE — Telephone Encounter (Signed)
Called and spoke with pt and he stated that he has been off his oxygen at night and this has been doing good for him.  Off during the day and oxygen levels have been good, sats around 90's.  Pt wanted to call give an update.  He stated that MW told him to call our office before he comes off the oxygen at night.  MW please advise. Thanks  No Known Allergies

## 2013-04-05 NOTE — Telephone Encounter (Signed)
Called, spoke with pt -  Explained below to him.  He would like to proceed with ONO on RA.  Order has been placed.  Pt aware he will receive another call to have this set up.  He will call back if anything further is needed.

## 2013-04-06 NOTE — Progress Notes (Signed)
Quick Note:  Spoke with pt and notified of results per Dr. Wert. Pt verbalized understanding and denied any questions.  ______ 

## 2013-04-09 ENCOUNTER — Other Ambulatory Visit: Payer: Self-pay | Admitting: Dermatology

## 2013-04-11 ENCOUNTER — Telehealth: Payer: Self-pay | Admitting: Internal Medicine

## 2013-04-11 NOTE — Telephone Encounter (Signed)
Spoke with patient-had ONO on Monday night; aware that it takes time to get results to MW to review and advise. Will send message to MW as reminder to call patient with results as soon as he can.

## 2013-04-16 ENCOUNTER — Telehealth: Payer: Self-pay | Admitting: Internal Medicine

## 2013-04-16 ENCOUNTER — Encounter: Payer: Self-pay | Admitting: Internal Medicine

## 2013-04-16 NOTE — Telephone Encounter (Signed)
ono reviewed > sent rec to [facility] to continue 02 at hs only

## 2013-04-16 NOTE — Telephone Encounter (Signed)
Nyoka Cowden, MD at 04/16/2013 10:08 AM   Status: Signed            ono reviewed > sent rec to [facility] to continue 02 at hs only   Pt advised. Carron Curie, CMA

## 2013-04-18 ENCOUNTER — Telehealth: Payer: Self-pay | Admitting: Internal Medicine

## 2013-04-18 DIAGNOSIS — J45909 Unspecified asthma, uncomplicated: Secondary | ICD-10-CM

## 2013-04-18 NOTE — Telephone Encounter (Signed)
Nyoka Cowden, MD at 04/16/2013 10:08 AM   Status: Signed            ono reviewed > sent rec to [facility] to continue 02 at hs only   --  lmtcb x1 for pt

## 2013-04-19 NOTE — Telephone Encounter (Signed)
LAst oV on 8-5 pt instruct was as follows: Ok to adjust the 02 so saturation is over 90 - if not needing it at all during the day call for overnight test on Room Air [phone] and ask Almyra Free  The pt did call and had an ONO done which resulted that he need to continue O2 at bedtime only. So pt is calling today because he needs an order sent to [facility] to discontinue daytime oxygen so they can pick-up portable tanks. I wanted to make sure this is what is wanted. Please advise if ok to d/c portable oxygen? Carron Curie, CMA

## 2013-04-19 NOTE — Telephone Encounter (Signed)
Yes ok to d/c daytime 02

## 2013-04-19 NOTE — Telephone Encounter (Signed)
Order placed. Pt is aware.  Lorenzo Arscott, CMA  

## 2013-05-01 ENCOUNTER — Encounter: Payer: Self-pay | Admitting: Internal Medicine

## 2013-05-03 ENCOUNTER — Other Ambulatory Visit (INDEPENDENT_AMBULATORY_CARE_PROVIDER_SITE_OTHER): Payer: Medicare Other

## 2013-05-03 DIAGNOSIS — I259 Chronic ischemic heart disease, unspecified: Secondary | ICD-10-CM

## 2013-05-03 LAB — HEPATIC FUNCTION PANEL
ALT: 12 U/L (ref 0–53)
AST: 23 U/L (ref 0–37)
Albumin: 3.6 g/dL (ref 3.5–5.2)
Alkaline Phosphatase: 57 U/L (ref 39–117)
Total Protein: 6.7 g/dL (ref 6.0–8.3)

## 2013-05-03 LAB — BASIC METABOLIC PANEL
CO2: 30 mEq/L (ref 19–32)
Chloride: 104 mEq/L (ref 96–112)
Glucose, Bld: 89 mg/dL (ref 70–99)
Potassium: 4.2 mEq/L (ref 3.5–5.1)
Sodium: 138 mEq/L (ref 135–145)

## 2013-05-03 NOTE — Progress Notes (Signed)
Quick Note:  Please make copy of labs for patient visit. ______ 

## 2013-05-04 ENCOUNTER — Other Ambulatory Visit: Payer: Self-pay | Admitting: Cardiology

## 2013-05-09 ENCOUNTER — Encounter: Payer: Self-pay | Admitting: Cardiology

## 2013-05-09 ENCOUNTER — Ambulatory Visit (INDEPENDENT_AMBULATORY_CARE_PROVIDER_SITE_OTHER): Payer: Medicare Other | Admitting: Cardiology

## 2013-05-09 VITALS — BP 148/70 | HR 56 | Ht 70.0 in | Wt 161.0 lb

## 2013-05-09 DIAGNOSIS — I259 Chronic ischemic heart disease, unspecified: Secondary | ICD-10-CM

## 2013-05-09 DIAGNOSIS — Z79899 Other long term (current) drug therapy: Secondary | ICD-10-CM

## 2013-05-09 DIAGNOSIS — E7800 Pure hypercholesterolemia, unspecified: Secondary | ICD-10-CM

## 2013-05-09 DIAGNOSIS — R5381 Other malaise: Secondary | ICD-10-CM | POA: Insufficient documentation

## 2013-05-09 NOTE — Assessment & Plan Note (Signed)
Lipids were reviewed.  HDL has improved and LDL has decreased he will continue same medications.  Is not having any myalgias.  He is on Lipitor 80 mg daily.

## 2013-05-09 NOTE — Progress Notes (Signed)
[name] Date of Birth:  1925/06/11 [facility] [address] [place], [place]  [address] [phone]         Fax   [phone]  History of Present Illness: This pleasant 79 year old gentleman is seen back for a scheduled 3 month followup office visit. He has a history of extensive ischemic heart disease. He had coronary artery bypass graft surgery in 1986. He had a redo CABG in 1993. He had PTCA with stent in October 2008. He had catheter with a drug-eluting stent in February 2010. He had a small non-Q MI in August 2010. His last nuclear stress test was 10/27/09 showing a moderate area of reversible ischemia in the inferolateral wall with an ejection fraction of 64%.  He presented to the emergency room on 07/15/12 after falling at home.  While in the emergency room he developed chest pain followed by ventricular tachycardia requiring cardioversion.  He was taken emergently to the catheter lab treated with a bare-metal stent to the mid LAD was treated initially with IV amiodarone which was subsequently stopped prior to discharge.  His echocardiogram November 2013 showed ejection fraction 40-45% and moderate left atrial enlargement. Since last visit he has been treated by urologist Dr. Mena Goes for right hydronephrosis secondary to renal calculus in the right ureter.  Current Outpatient Prescriptions  Medication Sig Dispense Refill  . aspirin 81 MG tablet ON HOLD  30 tablet  6  . atorvastatin (LIPITOR) 80 MG tablet Take 80 mg by mouth every evening.   30 tablet  6  . budesonide-formoterol (SYMBICORT) 80-4.5 MCG/ACT inhaler Inhale 2 puffs into the lungs 2 (two) times daily.      . calcium-vitamin D (OSCAL WITH D 500-200) 500-200 MG-UNIT per tablet Take 1 tablet by mouth 2 (two) times daily.        . carbamazepine (CARBATROL) 300 MG 12 hr capsule Take 300 mg by mouth 2 (two) times daily.       . clopidogrel (PLAVIX) 75 MG tablet Take 75 mg by mouth daily.        .  finasteride (PROSCAR) 5 MG tablet Take 5 mg by mouth daily.       . furosemide (LASIX) 40 MG tablet Take 40 mg by mouth every morning.      [name] ipratropium (ATROVENT HFA) 17 MCG/ACT inhaler Inhale 2 puffs into the lungs 2 (two) times daily as needed for wheezing.       . isosorbide mononitrate (IMDUR) 60 MG 24 hr tablet TAKE 1 AND 1/2 TABLETS BYMOUTH ONCE DAILY  60 tablet  6  . metoprolol succinate (TOPROL-XL) 25 MG 24 hr tablet Take 25 mg by mouth every morning.      . nitroGLYCERIN (NITROSTAT) 0.4 MG SL tablet Place 0.4 mg under the tongue every 5 (five) minutes as needed. For chest pain      . potassium chloride SA (K-DUR,KLOR-CON) 20 MEQ tablet Take 20 mEq by mouth daily.       . ranitidine (ZANTAC) 150 MG capsule Take 150 mg by mouth 2 (two) times daily.       . sertraline (ZOLOFT) 100 MG tablet Take 150 mg by mouth every morning. Takes 1 and 1/2      . temazepam (RESTORIL) 15 MG capsule Take 1 capsule (15 mg total) by mouth at bedtime as needed. For sleep  30 capsule  5   No current facility-administered medications for this visit.    No Known Allergies  Patient Active Problem  List   Diagnosis Date Noted  . Dizziness     Priority: High  . HYPERLIPIDEMIA 01/05/2007    Priority: Medium  . Cor Athrscl-Uns Vessel 01/05/2007    Priority: Medium  . Junctional bradycardia 07/25/2012  . Glucose intolerance (impaired glucose tolerance) 07/19/2012  . Normocytic anemia 07/19/2012  . SIRS (systemic inflammatory response syndrome) 07/17/2012  . Bacteremia due to Escherichia coli 07/17/2012  . Acute MI, anterolateral wall 07/16/2012  . Laceration of head 07/16/2012  . Prostatitis 07/16/2012  . E. coli UTI (urinary tract infection) 07/16/2012  . Elevated transaminase level 07/16/2012  . Foot drop, right 07/28/2011  . LBBB (left bundle branch block) 03/29/2011  . Hx of CABG 03/29/2011  . Ischemic heart disease   . Myocardial infarction   . Acute systolic congestive heart failure, NYHA  class 2-EF 40-45%   . Chronic respiratory failure   . Chronic asthma   . Peripheral neuropathy   . Dyslipidemia (high LDL; low HDL)   . PERIPHERAL NEUROPATHY 04/04/2007  . HYPERTENSION 04/04/2007  . PSORIASIS 04/04/2007  . OSTEOARTHRITIS 04/04/2007  . OSTEOPOROSIS 04/04/2007  . PROSTATE CANCER, HX OF 04/04/2007  . GERD 01/05/2007  . HERNIA 01/05/2007    History  Smoking status  . Never Smoker   Smokeless tobacco  . Never Used    History  Alcohol Use No    Family History  Problem Relation Age of Onset  . Heart attack Mother   . Addison's disease Father   . Heart attack Sister   . Anesthesia problems Neg Hx   . Hypotension Neg Hx   . Malignant hyperthermia Neg Hx   . Pseudochol deficiency Neg Hx     Review of Systems: Constitutional: no fever chills diaphoresis or fatigue or change in weight.  Head and neck: no hearing loss, no epistaxis, no photophobia or visual disturbance. Respiratory: No cough, shortness of breath or wheezing. Cardiovascular: No chest pain peripheral edema, palpitations. Gastrointestinal: No abdominal distention, no abdominal pain, no change in bowel habits hematochezia or melena. Genitourinary: No dysuria, no frequency, no urgency, no nocturia. Musculoskeletal:No arthralgias, no back pain, no gait disturbance or myalgias. Neurological: No dizziness, no headaches, no numbness, no seizures, no syncope, no weakness, no tremors. Hematologic: No lymphadenopathy, no easy bruising. Psychiatric: No confusion, no hallucinations, no sleep disturbance.    Physical Exam: Filed Vitals:   05/09/13 0843  BP: 148/70  Pulse: 56   the general appearance reveals a well-developed elderly gentleman in no distress.The head and neck exam reveals pupils equal and reactive.  Extraocular movements are full.  There is no scleral icterus.  The mouth and pharynx are normal.  The neck is supple.  The carotids reveal no bruits.  The jugular venous pressure is normal.  The   thyroid is not enlarged.  There is no lymphadenopathy.  The chest is clear to percussion and auscultation.  There are no rales or rhonchi.  Expansion of the chest is symmetrical.  The precordium is quiet.  The first heart sound is normal.  The second heart sound is physiologically split.  There is no murmur gallop rub or click.  There is no abnormal lift or heave.  The abdomen is soft and nontender.  The bowel sounds are normal.  The liver and spleen are not enlarged.  There are no abdominal masses.  There are no abdominal bruits.  Extremities reveal good pedal pulses.  There is no phlebitis or edema.  There is no cyanosis or clubbing.  Strength  is normal and symmetrical in all extremities.  There is no lateralizing weakness.  There are no sensory deficits.  The skin is warm and dry.  There is no rash.    Assessment / Plan: Continue same medication.  He still uses home oxygen at night because his oxygen saturations dropped during sleep as determined by overnight oximetry.  We will plan to recheck him in 3 months for followup office visit CBC and fasting labs.

## 2013-05-09 NOTE — Assessment & Plan Note (Signed)
The patient continues to have occasional angina pectoris relieved by sublingual nitroglycerin.  His last episode occurred in church last Sunday while he was seated at rest.  He does respond to sublingual nitroglycerin

## 2013-05-09 NOTE — Assessment & Plan Note (Signed)
Patient complains of lack of energy.  This is probably multifactorial.  Suggested that he might want to try a multivitamin daily.  We will check a CBC at his next visit

## 2013-05-09 NOTE — Patient Instructions (Addendum)
Your physician recommends that you continue on your current medications as directed. Please refer to the Current Medication list given to you today.  Your physician recommends that you schedule a follow-up appointment in: 3 months with fasting labs (LP/BMET/HFP/CBC)  

## 2013-05-12 ENCOUNTER — Other Ambulatory Visit: Payer: Self-pay | Admitting: Cardiology

## 2013-05-14 NOTE — Addendum Note (Signed)
Addended by: Carmela Hurt on: 05/14/2013 11:52 AM   Modules accepted: Orders, Medications

## 2013-06-18 ENCOUNTER — Other Ambulatory Visit: Payer: Self-pay | Admitting: Cardiology

## 2013-06-20 ENCOUNTER — Other Ambulatory Visit: Payer: Self-pay | Admitting: *Deleted

## 2013-06-20 DIAGNOSIS — G4700 Insomnia, unspecified: Secondary | ICD-10-CM

## 2013-06-20 MED ORDER — TEMAZEPAM 15 MG PO CAPS: 15.0000 mg | ORAL_CAPSULE | Freq: Every evening | ORAL | Status: DC | PRN

## 2013-07-05 ENCOUNTER — Other Ambulatory Visit: Payer: Self-pay

## 2013-08-01 ENCOUNTER — Other Ambulatory Visit: Payer: Medicare Other

## 2013-08-01 ENCOUNTER — Other Ambulatory Visit (INDEPENDENT_AMBULATORY_CARE_PROVIDER_SITE_OTHER): Payer: Medicare Other

## 2013-08-01 DIAGNOSIS — Z79899 Other long term (current) drug therapy: Secondary | ICD-10-CM

## 2013-08-01 DIAGNOSIS — E7800 Pure hypercholesterolemia, unspecified: Secondary | ICD-10-CM

## 2013-08-01 LAB — LIPID PANEL
Cholesterol: 171 mg/dL (ref 0–200)
VLDL: 13.6 mg/dL (ref 0.0–40.0)

## 2013-08-01 LAB — HEPATIC FUNCTION PANEL
ALT: 21 U/L (ref 0–53)
AST: 32 U/L (ref 0–37)
Albumin: 3.6 g/dL (ref 3.5–5.2)
Alkaline Phosphatase: 59 U/L (ref 39–117)
Bilirubin, Direct: 0.1 mg/dL (ref 0.0–0.3)
Total Bilirubin: 0.5 mg/dL (ref 0.3–1.2)
Total Protein: 6.8 g/dL (ref 6.0–8.3)

## 2013-08-01 LAB — CBC WITH DIFFERENTIAL/PLATELET
Basophils Absolute: 0 10*3/uL (ref 0.0–0.1)
HCT: 35.4 % — ABNORMAL LOW (ref 39.0–52.0)
Lymphocytes Relative: 34.6 % (ref 12.0–46.0)
Lymphs Abs: 1.8 10*3/uL (ref 0.7–4.0)
Monocytes Relative: 8.8 % (ref 3.0–12.0)
Platelets: 252 10*3/uL (ref 150.0–400.0)
RDW: 14.3 % (ref 11.5–14.6)

## 2013-08-01 LAB — BASIC METABOLIC PANEL
BUN: 14 mg/dL (ref 6–23)
Chloride: 105 mEq/L (ref 96–112)
GFR: 82.83 mL/min (ref >60.00)
Glucose, Bld: 92 mg/dL (ref 70–99)
Potassium: 4.5 mEq/L (ref 3.5–5.1)

## 2013-08-01 NOTE — Progress Notes (Signed)
Quick Note:  Please make copy of labs for patient visit. ______ 

## 2013-08-08 ENCOUNTER — Encounter: Payer: Self-pay | Admitting: Cardiology

## 2013-08-08 ENCOUNTER — Ambulatory Visit (INDEPENDENT_AMBULATORY_CARE_PROVIDER_SITE_OTHER): Payer: Medicare Other | Admitting: Cardiology

## 2013-08-08 VITALS — BP 122/60 | HR 60 | Ht 70.0 in | Wt 164.0 lb

## 2013-08-08 DIAGNOSIS — I509 Heart failure, unspecified: Secondary | ICD-10-CM

## 2013-08-08 DIAGNOSIS — R42 Dizziness and giddiness: Secondary | ICD-10-CM

## 2013-08-08 DIAGNOSIS — I5021 Acute systolic (congestive) heart failure: Secondary | ICD-10-CM

## 2013-08-08 DIAGNOSIS — I259 Chronic ischemic heart disease, unspecified: Secondary | ICD-10-CM

## 2013-08-08 DIAGNOSIS — R5381 Other malaise: Secondary | ICD-10-CM

## 2013-08-08 DIAGNOSIS — E785 Hyperlipidemia, unspecified: Secondary | ICD-10-CM

## 2013-08-08 DIAGNOSIS — R531 Weakness: Secondary | ICD-10-CM

## 2013-08-08 NOTE — Assessment & Plan Note (Signed)
No new symptoms of crescendo angina.

## 2013-08-08 NOTE — Patient Instructions (Signed)
Your physician recommends that you continue on your current medications as directed. Please refer to the Current Medication list given to you today.  Your physician recommends that you schedule a follow-up appointment in: 3 month ov/bmet/cbc

## 2013-08-08 NOTE — Assessment & Plan Note (Signed)
Blood pressure today is normal.  At times his blood pressure is low and that is when he feels dizzy.  He notes symptoms of dizziness if he stands too long in one spot such as when he is doing he dishes.  Will continue same medication.  Minimal to increase fluid intake.  Continue to wear support stockings.

## 2013-08-08 NOTE — Assessment & Plan Note (Signed)
The patient is not having any overt symptoms of heart failure.  He does have depressed LV systolic function.  We will consider echocardiogram after his next office visit

## 2013-08-08 NOTE — Assessment & Plan Note (Signed)
The patient's lab work was reviewed.  Blood work is satisfactory on current therapy.  Renal function is normal.

## 2013-08-08 NOTE — Progress Notes (Signed)
[name] Date of Birth:  [birth year] [address] [facility] [address] [place], [place]  [address] [phone]         Fax   [phone]  History of Present Illness: This pleasant 79 year old gentleman is seen back for a scheduled 3 month followup office visit. He has a history of extensive ischemic heart disease. He had coronary artery bypass graft surgery in 1986. He had a redo CABG in 1993. He had PTCA with stent in October 2008. He had catheter with a drug-eluting stent in February 2010. He had a small non-Q MI in August 2010. His last nuclear stress test was 10/27/09 showing a moderate area of reversible ischemia in the inferolateral wall with an ejection fraction of 64%.  He presented to the emergency room on 07/15/12 after falling at home.  While in the emergency room he developed chest pain followed by ventricular tachycardia requiring cardioversion.  He was taken emergently to the catheter lab treated with a bare-metal stent to the mid LAD was treated initially with IV amiodarone which was subsequently stopped prior to discharge.  His echocardiogram November 2013 showed ejection fraction 40-45% and moderate left atrial enlargement. Since last visit he has been treated by urologist Dr. Mena Goes for right hydronephrosis secondary to renal calculus in the right ureter. Since last visit he has not been experiencing any new cardiac symptoms.  He is eating better and his weight is up 3 pounds.  He takes just an occasional sublingual nitroglycerin.  He has occasional symptoms of orthostatic hypotension if he stands too long in one spot.  He already wears support hose.  Current Outpatient Prescriptions  Medication Sig Dispense Refill  . acetaminophen (TYLENOL) 500 MG tablet Take 500 mg by mouth 2 (two) times daily. Once in the am Once in the pm      . aspirin 81 MG tablet ON HOLD  30 tablet  6  . atorvastatin (LIPITOR) 80 MG tablet Take 80 mg by mouth every evening.   30 tablet  6  .  budesonide-formoterol (SYMBICORT) 80-4.5 MCG/ACT inhaler Inhale 2 puffs into the lungs 2 (two) times daily.      . carbamazepine (CARBATROL) 300 MG 12 hr capsule Take 300 mg by mouth 2 (two) times daily.       . clopidogrel (PLAVIX) 75 MG tablet Take 75 mg by mouth daily.        . finasteride (PROSCAR) 5 MG tablet Take 5 mg by mouth daily.       . furosemide (LASIX) 40 MG tablet Take 40 mg by mouth every morning.      [name] ipratropium (ATROVENT HFA) 17 MCG/ACT inhaler Inhale 2 puffs into the lungs 2 (two) times daily as needed for wheezing.       . isosorbide mononitrate (IMDUR) 60 MG 24 hr tablet TAKE 1 AND 1/2 TABLETS BYMOUTH ONCE DAILY  60 tablet  6  . metoprolol succinate (TOPROL-XL) 25 MG 24 hr tablet Take 25 mg by mouth every morning.      [name] NITROSTAT 0.4 MG SL tablet PLACE 1 TABLET UNDER THE TONGUE EVERY 5 MINUTES ASDIRECTED BY PHYSICIAN  25 tablet  11  . potassium chloride SA (K-DUR,KLOR-CON) 20 MEQ tablet Take 20 mEq by mouth daily.       . ranitidine (ZANTAC) 150 MG tablet Take 1 tablet (150 mg total) by mouth 2 (two) times daily.  90 tablet  3  . sertraline (ZOLOFT) 100 MG tablet Take 150 mg by mouth every  morning. Takes 1 and 1/2      . temazepam (RESTORIL) 15 MG capsule Take 1 capsule (15 mg total) by mouth at bedtime as needed.  30 capsule  5   No current facility-administered medications for this visit.    No Known Allergies  Patient Active Problem List   Diagnosis Date Noted  . Dizziness     Priority: High  . HYPERLIPIDEMIA 01/05/2007    Priority: Medium  . Cor Athrscl-Uns Vessel 01/05/2007    Priority: Medium  . Malaise and fatigue 05/09/2013  . Junctional bradycardia 07/25/2012  . Glucose intolerance (impaired glucose tolerance) 07/19/2012  . Normocytic anemia 07/19/2012  . SIRS (systemic inflammatory response syndrome) 07/17/2012  . Bacteremia due to Escherichia coli 07/17/2012  . Acute MI, anterolateral wall 07/16/2012  . Laceration of head 07/16/2012  .  Prostatitis 07/16/2012  . E. coli UTI (urinary tract infection) 07/16/2012  . Elevated transaminase level 07/16/2012  . Foot drop, right 07/28/2011  . LBBB (left bundle branch block) 03/29/2011  . Hx of CABG 03/29/2011  . Ischemic heart disease   . Myocardial infarction   . Acute systolic congestive heart failure, NYHA class 2-EF 40-45%   . Chronic respiratory failure   . Chronic asthma   . Peripheral neuropathy   . Dyslipidemia (high LDL; low HDL)   . PERIPHERAL NEUROPATHY 04/04/2007  . HYPERTENSION 04/04/2007  . PSORIASIS 04/04/2007  . OSTEOARTHRITIS 04/04/2007  . OSTEOPOROSIS 04/04/2007  . PROSTATE CANCER, HX OF 04/04/2007  . GERD 01/05/2007  . HERNIA 01/05/2007    History  Smoking status  . Never Smoker   Smokeless tobacco  . Never Used    History  Alcohol Use No    Family History  Problem Relation Age of Onset  . Heart attack Mother   . Addison's disease Father   . Heart attack Sister   . Anesthesia problems Neg Hx   . Hypotension Neg Hx   . Malignant hyperthermia Neg Hx   . Pseudochol deficiency Neg Hx     Review of Systems: Constitutional: no fever chills diaphoresis or fatigue or change in weight.  Head and neck: no hearing loss, no epistaxis, no photophobia or visual disturbance. Respiratory: No cough, shortness of breath or wheezing. Cardiovascular: No chest pain peripheral edema, palpitations. Gastrointestinal: No abdominal distention, no abdominal pain, no change in bowel habits hematochezia or melena. Genitourinary: No dysuria, no frequency, no urgency, no nocturia. Musculoskeletal:No arthralgias, no back pain, no gait disturbance or myalgias. Neurological: No dizziness, no headaches, no numbness, no seizures, no syncope, no weakness, no tremors. Hematologic: No lymphadenopathy, no easy bruising. Psychiatric: No confusion, no hallucinations, no sleep disturbance.    Physical Exam: Filed Vitals:   08/08/13 0935  BP: 122/60  Pulse: 60   the  general appearance reveals a well-developed elderly gentleman in no distress.The head and neck exam reveals pupils equal and reactive.  Extraocular movements are full.  There is no scleral icterus.  The mouth and pharynx are normal.  The neck is supple.  The carotids reveal no bruits.  The jugular venous pressure is normal.  The  thyroid is not enlarged.  There is no lymphadenopathy.  The chest is clear to percussion and auscultation.  There are no rales or rhonchi.  Expansion of the chest is symmetrical.  The precordium is quiet.  The first heart sound is normal.  The second heart sound is physiologically split.  There is no murmur gallop rub or click.  There is no abnormal  lift or heave.  The abdomen is soft and nontender.  The bowel sounds are normal.  The liver and spleen are not enlarged.  There are no abdominal masses.  There are no abdominal bruits.  Extremities reveal good pedal pulses.  There is no phlebitis or edema.  There is no cyanosis or clubbing.  Strength is normal and symmetrical in all extremities.  There is no lateralizing weakness.  There are no sensory deficits.  The skin is warm and dry.  There is no rash.    Assessment / Plan: Continue same medication.  He still uses home oxygen at night because his oxygen saturations dropped during sleep as determined by overnight oximetry.  We will plan to recheck him in 3 months for followup office visit CBC basal metabolic panel.

## 2013-08-28 ENCOUNTER — Emergency Department (HOSPITAL_COMMUNITY): Payer: Medicare Other

## 2013-08-28 ENCOUNTER — Encounter (HOSPITAL_COMMUNITY): Payer: Self-pay | Admitting: Emergency Medicine

## 2013-08-28 ENCOUNTER — Inpatient Hospital Stay (HOSPITAL_COMMUNITY)
Admission: EM | Admit: 2013-08-28 | Discharge: 2013-09-04 | DRG: 481 | Disposition: A | Discharge status: Skilled Nursing Facility | Payer: Medicare Other | Attending: Internal Medicine | Admitting: Internal Medicine

## 2013-08-28 DIAGNOSIS — Z951 Presence of aortocoronary bypass graft: Secondary | ICD-10-CM

## 2013-08-28 DIAGNOSIS — Z8546 Personal history of malignant neoplasm of prostate: Secondary | ICD-10-CM

## 2013-08-28 DIAGNOSIS — Z8249 Family history of ischemic heart disease and other diseases of the circulatory system: Secondary | ICD-10-CM

## 2013-08-28 DIAGNOSIS — I252 Old myocardial infarction: Secondary | ICD-10-CM

## 2013-08-28 DIAGNOSIS — I447 Left bundle-branch block, unspecified: Secondary | ICD-10-CM

## 2013-08-28 DIAGNOSIS — R339 Retention of urine, unspecified: Secondary | ICD-10-CM | POA: Diagnosis present

## 2013-08-28 DIAGNOSIS — I739 Peripheral vascular disease, unspecified: Secondary | ICD-10-CM | POA: Diagnosis present

## 2013-08-28 DIAGNOSIS — S72141A Displaced intertrochanteric fracture of right femur, initial encounter for closed fracture: Secondary | ICD-10-CM | POA: Diagnosis present

## 2013-08-28 DIAGNOSIS — Y92009 Unspecified place in unspecified non-institutional (private) residence as the place of occurrence of the external cause: Secondary | ICD-10-CM

## 2013-08-28 DIAGNOSIS — R42 Dizziness and giddiness: Secondary | ICD-10-CM

## 2013-08-28 DIAGNOSIS — I219 Acute myocardial infarction, unspecified: Secondary | ICD-10-CM

## 2013-08-28 DIAGNOSIS — G629 Polyneuropathy, unspecified: Secondary | ICD-10-CM

## 2013-08-28 DIAGNOSIS — Z96659 Presence of unspecified artificial knee joint: Secondary | ICD-10-CM

## 2013-08-28 DIAGNOSIS — J449 Chronic obstructive pulmonary disease, unspecified: Secondary | ICD-10-CM | POA: Diagnosis present

## 2013-08-28 DIAGNOSIS — J45909 Unspecified asthma, uncomplicated: Secondary | ICD-10-CM | POA: Diagnosis present

## 2013-08-28 DIAGNOSIS — M216X9 Other acquired deformities of unspecified foot: Secondary | ICD-10-CM | POA: Diagnosis present

## 2013-08-28 DIAGNOSIS — I5022 Chronic systolic (congestive) heart failure: Secondary | ICD-10-CM | POA: Diagnosis present

## 2013-08-28 DIAGNOSIS — F329 Major depressive disorder, single episode, unspecified: Secondary | ICD-10-CM | POA: Diagnosis present

## 2013-08-28 DIAGNOSIS — I5021 Acute systolic (congestive) heart failure: Secondary | ICD-10-CM

## 2013-08-28 DIAGNOSIS — I2589 Other forms of chronic ischemic heart disease: Secondary | ICD-10-CM | POA: Diagnosis present

## 2013-08-28 DIAGNOSIS — L408 Other psoriasis: Secondary | ICD-10-CM

## 2013-08-28 DIAGNOSIS — Z7982 Long term (current) use of aspirin: Secondary | ICD-10-CM

## 2013-08-28 DIAGNOSIS — D62 Acute posthemorrhagic anemia: Secondary | ICD-10-CM

## 2013-08-28 DIAGNOSIS — Z7902 Long term (current) use of antithrombotics/antiplatelets: Secondary | ICD-10-CM

## 2013-08-28 DIAGNOSIS — I10 Essential (primary) hypertension: Secondary | ICD-10-CM | POA: Diagnosis present

## 2013-08-28 DIAGNOSIS — S72143A Displaced intertrochanteric fracture of unspecified femur, initial encounter for closed fracture: Principal | ICD-10-CM | POA: Diagnosis present

## 2013-08-28 DIAGNOSIS — G4700 Insomnia, unspecified: Secondary | ICD-10-CM

## 2013-08-28 DIAGNOSIS — K469 Unspecified abdominal hernia without obstruction or gangrene: Secondary | ICD-10-CM

## 2013-08-28 DIAGNOSIS — W010XXA Fall on same level from slipping, tripping and stumbling without subsequent striking against object, initial encounter: Secondary | ICD-10-CM | POA: Diagnosis present

## 2013-08-28 DIAGNOSIS — K219 Gastro-esophageal reflux disease without esophagitis: Secondary | ICD-10-CM | POA: Diagnosis present

## 2013-08-28 DIAGNOSIS — R7881 Bacteremia: Secondary | ICD-10-CM

## 2013-08-28 DIAGNOSIS — R5381 Other malaise: Secondary | ICD-10-CM | POA: Diagnosis present

## 2013-08-28 DIAGNOSIS — I509 Heart failure, unspecified: Secondary | ICD-10-CM | POA: Diagnosis present

## 2013-08-28 DIAGNOSIS — M810 Age-related osteoporosis without current pathological fracture: Secondary | ICD-10-CM

## 2013-08-28 DIAGNOSIS — J4489 Other specified chronic obstructive pulmonary disease: Secondary | ICD-10-CM | POA: Diagnosis present

## 2013-08-28 DIAGNOSIS — N419 Inflammatory disease of prostate, unspecified: Secondary | ICD-10-CM

## 2013-08-28 DIAGNOSIS — E785 Hyperlipidemia, unspecified: Secondary | ICD-10-CM | POA: Diagnosis present

## 2013-08-28 DIAGNOSIS — Z8674 Personal history of sudden cardiac arrest: Secondary | ICD-10-CM

## 2013-08-28 DIAGNOSIS — R001 Bradycardia, unspecified: Secondary | ICD-10-CM

## 2013-08-28 DIAGNOSIS — B9620 Unspecified Escherichia coli [E. coli] as the cause of diseases classified elsewhere: Secondary | ICD-10-CM

## 2013-08-28 DIAGNOSIS — Z9861 Coronary angioplasty status: Secondary | ICD-10-CM

## 2013-08-28 DIAGNOSIS — N401 Enlarged prostate with lower urinary tract symptoms: Secondary | ICD-10-CM | POA: Diagnosis present

## 2013-08-28 DIAGNOSIS — I2109 ST elevation (STEMI) myocardial infarction involving other coronary artery of anterior wall: Secondary | ICD-10-CM

## 2013-08-28 DIAGNOSIS — G609 Hereditary and idiopathic neuropathy, unspecified: Secondary | ICD-10-CM | POA: Diagnosis present

## 2013-08-28 DIAGNOSIS — I259 Chronic ischemic heart disease, unspecified: Secondary | ICD-10-CM

## 2013-08-28 DIAGNOSIS — N138 Other obstructive and reflux uropathy: Secondary | ICD-10-CM | POA: Diagnosis present

## 2013-08-28 DIAGNOSIS — J9610 Chronic respiratory failure, unspecified whether with hypoxia or hypercapnia: Secondary | ICD-10-CM

## 2013-08-28 DIAGNOSIS — M21371 Foot drop, right foot: Secondary | ICD-10-CM

## 2013-08-28 DIAGNOSIS — D649 Anemia, unspecified: Secondary | ICD-10-CM

## 2013-08-28 DIAGNOSIS — Z9089 Acquired absence of other organs: Secondary | ICD-10-CM

## 2013-08-28 DIAGNOSIS — I951 Orthostatic hypotension: Secondary | ICD-10-CM | POA: Diagnosis not present

## 2013-08-28 DIAGNOSIS — S72009A Fracture of unspecified part of neck of unspecified femur, initial encounter for closed fracture: Secondary | ICD-10-CM

## 2013-08-28 DIAGNOSIS — Z87442 Personal history of urinary calculi: Secondary | ICD-10-CM

## 2013-08-28 DIAGNOSIS — M1990 Unspecified osteoarthritis, unspecified site: Secondary | ICD-10-CM

## 2013-08-28 DIAGNOSIS — R7302 Impaired glucose tolerance (oral): Secondary | ICD-10-CM

## 2013-08-28 DIAGNOSIS — Z79899 Other long term (current) drug therapy: Secondary | ICD-10-CM

## 2013-08-28 DIAGNOSIS — R6510 Systemic inflammatory response syndrome (SIRS) of non-infectious origin without acute organ dysfunction: Secondary | ICD-10-CM

## 2013-08-28 DIAGNOSIS — Z923 Personal history of irradiation: Secondary | ICD-10-CM

## 2013-08-28 DIAGNOSIS — I2510 Atherosclerotic heart disease of native coronary artery without angina pectoris: Secondary | ICD-10-CM | POA: Diagnosis present

## 2013-08-28 DIAGNOSIS — F3289 Other specified depressive episodes: Secondary | ICD-10-CM | POA: Diagnosis present

## 2013-08-28 LAB — PROTIME-INR
INR: 1.04 (ref 0.00–1.49)
Prothrombin Time: 13.4 seconds (ref 11.6–15.2)

## 2013-08-28 LAB — CBC WITH DIFFERENTIAL/PLATELET
Basophils Absolute: 0 10*3/uL (ref 0.0–0.1)
Basophils Relative: 0 % (ref 0–1)
Eosinophils Absolute: 0.2 10*3/uL (ref 0.0–0.7)
HCT: 33.4 % — ABNORMAL LOW (ref 39.0–52.0)
MCH: 32.5 pg (ref 26.0–34.0)
MCHC: 33.5 g/dL (ref 30.0–36.0)
Neutrophils Relative %: 73 % (ref 43–77)
Platelets: 226 10*3/uL (ref 150–400)
RBC: 3.45 MIL/uL — ABNORMAL LOW (ref 4.22–5.81)
RDW: 13.6 % (ref 11.5–15.5)

## 2013-08-28 LAB — BASIC METABOLIC PANEL
BUN: 15 mg/dL (ref 6–23)
Calcium: 9.5 mg/dL (ref 8.4–10.5)
Chloride: 100 mEq/L (ref 96–112)
GFR calc non Af Amer: 73 mL/min — ABNORMAL LOW (ref >90)
Glucose, Bld: 117 mg/dL — ABNORMAL HIGH (ref 70–99)
Sodium: 135 mEq/L — ABNORMAL LOW (ref 137–147)

## 2013-08-28 MED ORDER — HYDROMORPHONE HCL PF 1 MG/ML IJ SOLN
0.5000 mg | INTRAMUSCULAR | Status: AC | PRN
  Administered 2013-08-28 – 2013-08-29 (×2): 0.5 mg via INTRAVENOUS
  Filled 2013-08-28 (×2): qty 1

## 2013-08-28 NOTE — ED Notes (Signed)
Pt presents via EMS with c/o fall and right leg pain. Pt was walking around his car and fell onto his right side, injuring his right leg. Right leg is shortened and has outward rotation. 20g right hand IV started by EMS, 250 mcg fentanyl given en route.

## 2013-08-28 NOTE — ED Provider Notes (Signed)
CSN: [id number]     Arrival date & time 08/28/13  2136 History   First MD Initiated Contact with Patient 08/28/13 2138     Chief Complaint  Patient presents with  . Fall  . Hip Pain   (Consider location/radiation/quality/duration/timing/severity/associated sxs/prior Treatment) HPI Comments: Patient with multiple chronic medical conditions including CABG, MI, sepsis, cardiac arrest -- presents with complaint of right hip pain that began acutely when he fell after getting out of the vehicle today. He denies hitting his head or losing consciousness. Patient was able to get up and EMS was called. Patient received 250 mcg of fentanyl en route. Pain is now controlled. Patient sustained a skin tear to left hand. He is on Plavix. The course is constant. Aggravating factors: movement, palpation. Alleviating factors: none.    Patient is a 79 y.o. male presenting with fall and hip pain. The history is provided by the patient.  Fall Associated symptoms include arthralgias. Pertinent negatives include no abdominal pain, chest pain, coughing, fever, headaches, joint swelling, myalgias, nausea, neck pain, numbness, rash, sore throat, vomiting or weakness.  Hip Pain Associated symptoms include arthralgias. Pertinent negatives include no abdominal pain, chest pain, coughing, fever, headaches, joint swelling, myalgias, nausea, neck pain, numbness, rash, sore throat, vomiting or weakness.    Past Medical History  Diagnosis Date  . Systolic CHF     EF 40-45% by echo 07/18/12  . Dyslipidemia (high LDL; low HDL)   . Coronary artery disease     s/p CABG; Anterolateral STEMI 06/2012 s/p BMS to mid LAD   . Heart murmur   . Peripheral vascular disease     thrombus- in leg- many yrs. ago- ?R- coumadin x1 mth  . Pneumonia     hosp. long time ago   . COPD (chronic obstructive pulmonary disease)     dc'd on home O2 06/2012  . Blood transfusion     post CABG  . Hepatitis     hepatitis- long time ago,  occupational contamination   . GERD (gastroesophageal reflux disease)   . Cancer     prostate - Ca- radiation therapy- 2002  . Peripheral neuropathy   . Hiatal hernia   . Arthritis     hnp, lumbar, ankle (foot drop-R), wears a splint, arthritis - "all over", low bone density   . Depression     uses Zoloft   . VT (ventricular tachycardia) 06/2012    in the setting of STEMI  . Bacteremia 06/2012    in the setting of UTI/Prostatits, Blood Cx E.Coli tx w/ Bactrim  . Anemia   . Glucose intolerance (impaired glucose tolerance) 06/2012    A1C 5.8  . Prostatitis   . Myocardial infarction     several, the last was 06/2012  . H/O cardiac arrest     x2  . Anginal pain   . Hypertension   . Asthma   . History of kidney stones    Past Surgical History  Procedure Laterality Date  . Coronary artery bypass graft  1986 and 1993  . Ptca  2008  . Cardiac catheterization      2010- last cath, two stents at that time  . Eye surgery      cataracts - bilateral , IOL  . Hernia repair      bilateral inguiinal repair   . Fracture surgery      heel- crushed -2002,  (hardware)   . Joint replacement      R knee- arthroscopic, x2,  R great toe- 2 surgeries- /w hardware & thenlater fused by Dr. Lajoyce Corners    . Rhinoplasty  1610,9604    as a teen, followed by same procedure at 1970's   . Lumbar laminectomy/decompression microdiscectomy  08/10/2011    Procedure: LUMBAR LAMINECTOMY/DECOMPRESSION MICRODISCECTOMY;  Surgeon: Kathaleen Maser Pool;  Location: [facility];  Service: Neurosurgery;  Laterality: Right;  RIGHT Lumbar five-sacral one LAMINECTOMY, MICRODISCECTOMY  . Lumbar wound debridement  08/22/2011    Procedure: LUMBAR WOUND DEBRIDEMENT;  Surgeon: Tia Alert;  Location: [facility];  Service: Neurosurgery;  Laterality: N/A;  Repair of CSF Leak requiring laminectomy  . Coronary angioplasty with stent placement  X 4  . Cystoscopy w/ ureteral stent placement Right 02/28/2013    Procedure: CYSTOSCOPY WITH  RETROGRADE PYELOGRAM/URETERAL STENT PLACEMENT;  Surgeon: Crecencio Mc, MD;  Location: [facility];  Service: Urology;  Laterality: Right;  . Tonsillectomy  1940  . Appendectomy  1943  . Hammer toe surgery      right little toe  . Transurethral resection of prostate      x2  . Foot surgery Left 2000    heel fusion  . Toe surgery Right 2002  . Cystoscopy with ureteroscopy and stent placement Right 03/23/2013    Procedure: RIGHT URETEROSCOPY, LASER LITHO AND STENT PLACEMENT;  Surgeon: Antony Haste, MD;  Location: [facility];  Service: Urology;  Laterality: Right;  . Holmium laser application Right 03/23/2013    Procedure: HOLMIUM LASER APPLICATION;  Surgeon: Antony Haste, MD;  Location: [facility];  Service: Urology;  Laterality: Right;   Family History  Problem Relation Age of Onset  . Heart attack Mother   . Addison's disease Father   . Heart attack Sister   . Anesthesia problems Neg Hx   . Hypotension Neg Hx   . Malignant hyperthermia Neg Hx   . Pseudochol deficiency Neg Hx    History  Substance Use Topics  . Smoking status: Never Smoker   . Smokeless tobacco: Never Used  . Alcohol Use: No    Review of Systems  Constitutional: Negative for fever and activity change.  HENT: Negative for rhinorrhea and sore throat.   Eyes: Negative for redness.  Respiratory: Negative for cough.   Cardiovascular: Negative for chest pain.  Gastrointestinal: Negative for nausea, vomiting, abdominal pain and diarrhea.  Genitourinary: Negative for dysuria.  Musculoskeletal: Positive for arthralgias. Negative for back pain, gait problem, joint swelling, myalgias and neck pain.  Skin: Negative for rash and wound.  Neurological: Negative for weakness, numbness and headaches.    Allergies  Review of patient's allergies indicates no known allergies.  Home Medications   Current Outpatient Rx  Name  Route  Sig  Dispense  Refill  . acetaminophen (TYLENOL) 500 MG tablet   Oral   Take 500 mg  by mouth 2 (two) times daily. Once in the am Once in the pm         . aspirin 81 MG tablet      ON HOLD   30 tablet   6   . atorvastatin (LIPITOR) 80 MG tablet   Oral   Take 80 mg by mouth every evening.    30 tablet   6   . budesonide-formoterol (SYMBICORT) 80-4.5 MCG/ACT inhaler   Inhalation   Inhale 2 puffs into the lungs 2 (two) times daily.         . carbamazepine (CARBATROL) 300 MG 12 hr capsule   Oral   Take 300  mg by mouth 2 (two) times daily.          . clopidogrel (PLAVIX) 75 MG tablet   Oral   Take 75 mg by mouth daily.           . finasteride (PROSCAR) 5 MG tablet   Oral   Take 5 mg by mouth daily.          . furosemide (LASIX) 40 MG tablet   Oral   Take 40 mg by mouth every morning.         [name] ipratropium (ATROVENT HFA) 17 MCG/ACT inhaler   Inhalation   Inhale 2 puffs into the lungs 2 (two) times daily as needed for wheezing.          . isosorbide mononitrate (IMDUR) 60 MG 24 hr tablet      TAKE 1 AND 1/2 TABLETS BYMOUTH ONCE DAILY   60 tablet   6     Dispense as written.   . metoprolol succinate (TOPROL-XL) 25 MG 24 hr tablet   Oral   Take 25 mg by mouth every morning.         [name] NITROSTAT 0.4 MG SL tablet      PLACE 1 TABLET UNDER THE TONGUE EVERY 5 MINUTES ASDIRECTED BY PHYSICIAN   25 tablet   11   . potassium chloride SA (K-DUR,KLOR-CON) 20 MEQ tablet   Oral   Take 20 mEq by mouth daily.          . ranitidine (ZANTAC) 150 MG tablet   Oral   Take 1 tablet (150 mg total) by mouth 2 (two) times daily.   90 tablet   3   . sertraline (ZOLOFT) 100 MG tablet   Oral   Take 150 mg by mouth every morning. Takes 1 and 1/2         . temazepam (RESTORIL) 15 MG capsule   Oral   Take 1 capsule (15 mg total) by mouth at bedtime as needed.   30 capsule   5     For sleep    BP 141/56  Pulse 66  Temp(Src) 98.3 F (36.8 C)  Resp 16  SpO2 95% Physical Exam  Nursing note and vitals reviewed. Constitutional: He  appears well-developed and well-nourished.  HENT:  Head: Normocephalic and atraumatic.  Eyes: Conjunctivae are normal. Right eye exhibits no discharge. Left eye exhibits no discharge.  Neck: Normal range of motion. Neck supple.  Cardiovascular: Normal rate, regular rhythm and normal heart sounds.   Pulses:      Dorsalis pedis pulses are 2+ on the right side, and 2+ on the left side.       Posterior tibial pulses are 2+ on the right side, and 2+ on the left side.  Pulmonary/Chest: Effort normal and breath sounds normal.  Abdominal: Soft. There is no tenderness.  Musculoskeletal:       Right hip: He exhibits decreased range of motion, tenderness and bony tenderness.       Right knee: Normal.       Right ankle: Normal.       Lumbar back: Normal.       Legs: Neurological: He is alert.  Skin: Skin is warm and dry.  Psychiatric: He has a normal mood and affect.    ED Course  Procedures (including critical care time) Labs Review Labs Reviewed  BASIC METABOLIC PANEL - Abnormal; Notable for the following:    Sodium 135 (*)    Glucose, Bld 117 (*)  GFR calc non Af Amer 73 (*)    GFR calc Af Amer 85 (*)    All other components within normal limits  CBC WITH DIFFERENTIAL - Abnormal; Notable for the following:    RBC 3.45 (*)    Hemoglobin 11.2 (*)    HCT 33.4 (*)    All other components within normal limits  PROTIME-INR  URINALYSIS, ROUTINE W REFLEX MICROSCOPIC  TYPE AND SCREEN  ABO/RH   Imaging Review Dg Chest 1 View  08/28/2013   CLINICAL DATA:  Right hip pain after a fall.  EXAM: CHEST - 1 VIEW  COMPARISON:  04/03/2013  FINDINGS: Stable appearance of postoperative change in the mediastinum. Heart size and pulmonary vascularity are normal for technique. Linear atelectasis or fibrosis in the right lung base is similar to previous study. Emphysematous change in the lungs. No focal consolidation or airspace disease. Tortuous aorta. Thoracic scoliosis. Stable appearance since  previous study.  IMPRESSION: Emphysematous changes in the lungs with fibrosis in the right lung base.   Electronically Signed   By: Burman Nieves M.D.   On: 08/28/2013 22:24   Dg Hip Complete Right  08/28/2013   CLINICAL DATA:  Right hip pain after a fall.  EXAM: RIGHT HIP - COMPLETE 2+ VIEW  COMPARISON:  None.  FINDINGS: There is a comminuted intertrochanteric fracture of the right hip with mild displacement of a lesser trochanteric fragment and varus angulation of the hip. No dislocation of the hip joint. Pelvis appears intact.  IMPRESSION: Intertrochanteric fracture of the right hip.   Electronically Signed   By: Burman Nieves M.D.   On: 08/28/2013 22:23    EKG Interpretation   None      9:50 PM Patient seen and examined. Work-up initiated. Medications ordered.   Vital signs reviewed and are as follows: Filed Vitals:   08/28/13 2138  BP: 141/56  Pulse: 66  Temp: 98.3 F (36.8 C)  Resp: 16   10:44 PM Spoke with Dr. Roda Shutters who will see in AM, plan to operate tomorrow if cleared. States OR availability better at [facility] tomorrow. Asked if it would be possible to transfer to [facility].    Date: 08/29/2013  Rate: 65  Rhythm: normal sinus rhythm  QRS Axis: left  Intervals: normal  ST/T Wave abnormalities: normal  Conduction Disutrbances:left bundle branch block  Narrative Interpretation:   Old EKG Reviewed: unchanged  12:15 AM Spoke with Dr. Jerilynn Birkenhead who will see and admit. Will transfer to [facility].   1:09 AM Dr. Allena Katz accepting physician at [facility].    MDM   1. Intertrochanteric fracture of right hip    Admit for hip fracture.     Renne Crigler, PA-C 08/29/13 0016  Renne Crigler, PA-C 08/29/13 [phone]

## 2013-08-28 NOTE — ED Notes (Signed)
Bed: ZO10 Expected date:  Expected time:  Means of arrival:  Comments: EMS/elderly-slipped and fell/hip pain-Fentanyl IV

## 2013-08-28 NOTE — Treatment Plan (Signed)
Will see patient in am. Please stop all anticoagulation if deemed medically appropriate per medicine. Will plan for surgery Wed pm.  Mayra Reel, MD [facility] [phone] 10:51 PM

## 2013-08-29 ENCOUNTER — Inpatient Hospital Stay (HOSPITAL_COMMUNITY): Payer: Medicare Other

## 2013-08-29 ENCOUNTER — Inpatient Hospital Stay (HOSPITAL_COMMUNITY): Payer: Medicare Other | Admitting: Anesthesiology

## 2013-08-29 ENCOUNTER — Encounter (HOSPITAL_COMMUNITY)
Admission: EM | Disposition: A | Discharge status: Skilled Nursing Facility | Payer: Self-pay | Source: Home / Self Care | Attending: Internal Medicine

## 2013-08-29 ENCOUNTER — Encounter (HOSPITAL_COMMUNITY): Payer: Medicare Other | Admitting: Anesthesiology

## 2013-08-29 DIAGNOSIS — J449 Chronic obstructive pulmonary disease, unspecified: Secondary | ICD-10-CM | POA: Diagnosis present

## 2013-08-29 DIAGNOSIS — K219 Gastro-esophageal reflux disease without esophagitis: Secondary | ICD-10-CM | POA: Insufficient documentation

## 2013-08-29 DIAGNOSIS — Z8674 Personal history of sudden cardiac arrest: Secondary | ICD-10-CM

## 2013-08-29 DIAGNOSIS — I259 Chronic ischemic heart disease, unspecified: Secondary | ICD-10-CM

## 2013-08-29 DIAGNOSIS — S72009A Fracture of unspecified part of neck of unspecified femur, initial encounter for closed fracture: Secondary | ICD-10-CM

## 2013-08-29 DIAGNOSIS — I10 Essential (primary) hypertension: Secondary | ICD-10-CM | POA: Insufficient documentation

## 2013-08-29 DIAGNOSIS — S72141A Displaced intertrochanteric fracture of right femur, initial encounter for closed fracture: Secondary | ICD-10-CM | POA: Diagnosis present

## 2013-08-29 DIAGNOSIS — S72143A Displaced intertrochanteric fracture of unspecified femur, initial encounter for closed fracture: Principal | ICD-10-CM

## 2013-08-29 HISTORY — PX: INTRAMEDULLARY (IM) NAIL INTERTROCHANTERIC: SHX5875

## 2013-08-29 LAB — URINALYSIS, ROUTINE W REFLEX MICROSCOPIC
Bilirubin Urine: NEGATIVE
Glucose, UA: NEGATIVE mg/dL
Hgb urine dipstick: NEGATIVE
Ketones, ur: NEGATIVE mg/dL
Leukocytes, UA: NEGATIVE
Nitrite: NEGATIVE
Protein, ur: NEGATIVE mg/dL
Specific Gravity, Urine: 1.016 (ref 1.005–1.030)
Urobilinogen, UA: 0.2 mg/dL (ref 0.0–1.0)
pH: 6 (ref 5.0–8.0)

## 2013-08-29 LAB — BASIC METABOLIC PANEL
BUN: 14 mg/dL (ref 6–23)
CO2: 27 mEq/L (ref 19–32)
Chloride: 104 mEq/L (ref 96–112)
Creatinine, Ser: 0.91 mg/dL (ref 0.50–1.35)
GFR calc Af Amer: 86 mL/min — ABNORMAL LOW (ref >90)
GFR calc non Af Amer: 75 mL/min — ABNORMAL LOW (ref >90)
Glucose, Bld: 110 mg/dL — ABNORMAL HIGH (ref 70–99)
Potassium: 4.2 mEq/L (ref 3.7–5.3)

## 2013-08-29 LAB — IRON AND TIBC
Iron: 46 ug/dL (ref 42–135)
Saturation Ratios: 15 % — ABNORMAL LOW (ref 20–55)
TIBC: 306 ug/dL (ref 215–435)
UIBC: 260 ug/dL (ref 125–400)

## 2013-08-29 LAB — CBC
Hemoglobin: 10.7 g/dL — ABNORMAL LOW (ref 13.0–17.0)
MCH: 32.8 pg (ref 26.0–34.0)
MCHC: 33.9 g/dL (ref 30.0–36.0)
MCV: 96.9 fL (ref 78.0–100.0)
RBC: 3.26 MIL/uL — ABNORMAL LOW (ref 4.22–5.81)
RDW: 13.7 % (ref 11.5–15.5)

## 2013-08-29 LAB — PROTIME-INR: Prothrombin Time: 13.9 seconds (ref 11.6–15.2)

## 2013-08-29 SURGERY — FIXATION, FRACTURE, INTERTROCHANTERIC, WITH INTRAMEDULLARY ROD
Anesthesia: General | Site: Hip | Laterality: Right

## 2013-08-29 MED ORDER — ACETAMINOPHEN 325 MG PO TABS: 650.0000 mg | ORAL_TABLET | Freq: Four times a day (QID) | ORAL | Status: DC | PRN

## 2013-08-29 MED ORDER — ISOSORBIDE MONONITRATE ER 60 MG PO TB24
90.0000 mg | ORAL_TABLET | Freq: Every day | ORAL | Status: DC
  Administered 2013-08-30 – 2013-09-03 (×3): 90 mg via ORAL
  Filled 2013-08-29 (×6): qty 1

## 2013-08-29 MED ORDER — OXYCODONE HCL 5 MG PO TABS: 5.0000 mg | ORAL_TABLET | Freq: Once | ORAL | Status: AC | PRN

## 2013-08-29 MED ORDER — ALUM & MAG HYDROXIDE-SIMETH 200-200-20 MG/5ML PO SUSP
30.0000 mL | ORAL | Status: DC | PRN
  Administered 2013-08-30 (×2): 30 mL via ORAL
  Filled 2013-08-29 (×2): qty 30

## 2013-08-29 MED ORDER — PHENYLEPHRINE HCL 10 MG/ML IJ SOLN
10.0000 mg | INTRAMUSCULAR | Status: DC | PRN
  Administered 2013-08-29: 50 ug/min via INTRAVENOUS

## 2013-08-29 MED ORDER — ROCURONIUM BROMIDE 100 MG/10ML IV SOLN
INTRAVENOUS | Status: DC | PRN
  Administered 2013-08-29: 35 mg via INTRAVENOUS

## 2013-08-29 MED ORDER — FUROSEMIDE 40 MG PO TABS
40.0000 mg | ORAL_TABLET | Freq: Every morning | ORAL | Status: DC
  Administered 2013-08-29 – 2013-09-04 (×6): 40 mg via ORAL
  Filled 2013-08-29 (×7): qty 1

## 2013-08-29 MED ORDER — SENNA 8.6 MG PO TABS
1.0000 | ORAL_TABLET | Freq: Two times a day (BID) | ORAL | Status: DC
  Administered 2013-08-29 – 2013-09-04 (×12): 8.6 mg via ORAL
  Filled 2013-08-29 (×13): qty 1

## 2013-08-29 MED ORDER — ONDANSETRON HCL 4 MG/2ML IJ SOLN
4.0000 mg | Freq: Four times a day (QID) | INTRAMUSCULAR | Status: DC | PRN
  Administered 2013-08-30: 4 mg via INTRAVENOUS
  Filled 2013-08-29: qty 2

## 2013-08-29 MED ORDER — CARBAMAZEPINE ER 300 MG PO CP12: 300.0000 mg | ORAL_CAPSULE | Freq: Two times a day (BID) | ORAL | Status: DC

## 2013-08-29 MED ORDER — MENTHOL 3 MG MT LOZG
1.0000 | LOZENGE | OROMUCOSAL | Status: DC | PRN
  Administered 2013-08-31: 3 mg via ORAL
  Filled 2013-08-29: qty 9

## 2013-08-29 MED ORDER — CEFAZOLIN SODIUM-DEXTROSE 2-3 GM-% IV SOLR
2.0000 g | INTRAVENOUS | Status: AC
  Administered 2013-08-29: 2 g via INTRAVENOUS
  Filled 2013-08-29: qty 50

## 2013-08-29 MED ORDER — POTASSIUM CHLORIDE CRYS ER 20 MEQ PO TBCR
20.0000 meq | EXTENDED_RELEASE_TABLET | Freq: Every day | ORAL | Status: DC
  Administered 2013-08-29 – 2013-09-04 (×7): 20 meq via ORAL
  Filled 2013-08-29 (×8): qty 1

## 2013-08-29 MED ORDER — FAMOTIDINE 20 MG PO TABS
20.0000 mg | ORAL_TABLET | Freq: Every day | ORAL | Status: DC
  Administered 2013-08-30 – 2013-09-04 (×6): 20 mg via ORAL
  Filled 2013-08-29 (×7): qty 1

## 2013-08-29 MED ORDER — EPHEDRINE SULFATE 50 MG/ML IJ SOLN
INTRAMUSCULAR | Status: AC
  Administered 2013-08-29: 10 mg via INTRAVENOUS
  Filled 2013-08-29: qty 1

## 2013-08-29 MED ORDER — MORPHINE SULFATE 2 MG/ML IJ SOLN
1.0000 mg | INTRAMUSCULAR | Status: DC | PRN
  Administered 2013-08-29 (×2): 1 mg via INTRAVENOUS
  Filled 2013-08-29 (×3): qty 1

## 2013-08-29 MED ORDER — METOCLOPRAMIDE HCL 10 MG PO TABS: 5.0000 mg | ORAL_TABLET | Freq: Three times a day (TID) | ORAL | Status: DC | PRN

## 2013-08-29 MED ORDER — OXYCODONE HCL 5 MG PO TABS
5.0000 mg | ORAL_TABLET | ORAL | Status: DC | PRN
  Administered 2013-08-29: 10 mg via ORAL
  Administered 2013-08-31 – 2013-09-01 (×2): 5 mg via ORAL
  Administered 2013-09-03: 10 mg via ORAL
  Administered 2013-09-03: 5 mg via ORAL
  Filled 2013-08-29: qty 1
  Filled 2013-08-29 (×4): qty 2
  Filled 2013-08-29: qty 1

## 2013-08-29 MED ORDER — POLYETHYLENE GLYCOL 3350 17 G PO PACK: 17.0000 g | PACK | Freq: Every day | ORAL | Status: DC | PRN

## 2013-08-29 MED ORDER — ONDANSETRON HCL 4 MG PO TABS: 4.0000 mg | ORAL_TABLET | Freq: Four times a day (QID) | ORAL | Status: DC | PRN

## 2013-08-29 MED ORDER — CLOPIDOGREL BISULFATE 75 MG PO TABS: 75.0000 mg | ORAL_TABLET | Freq: Every day | ORAL | Status: DC

## 2013-08-29 MED ORDER — GLYCOPYRROLATE 0.2 MG/ML IJ SOLN: 0.1000 mg | Freq: Once | INTRAMUSCULAR | Status: DC

## 2013-08-29 MED ORDER — HYDROCODONE-ACETAMINOPHEN 5-325 MG PO TABS
1.0000 | ORAL_TABLET | Freq: Four times a day (QID) | ORAL | Status: DC | PRN
  Administered 2013-09-04: 2 via ORAL
  Filled 2013-08-29: qty 2

## 2013-08-29 MED ORDER — ISOSORBIDE MONONITRATE ER 30 MG PO TB24
90.0000 mg | ORAL_TABLET | Freq: Every day | ORAL | Status: DC
  Administered 2013-08-29: 10:00:00 90 mg via ORAL
  Filled 2013-08-29: qty 1

## 2013-08-29 MED ORDER — SERTRALINE HCL 50 MG PO TABS
150.0000 mg | ORAL_TABLET | Freq: Every morning | ORAL | Status: DC
  Administered 2013-08-30 – 2013-09-04 (×6): 150 mg via ORAL
  Filled 2013-08-29 (×7): qty 1

## 2013-08-29 MED ORDER — ENOXAPARIN SODIUM 40 MG/0.4ML ~~LOC~~ SOLN
40.0000 mg | SUBCUTANEOUS | Status: DC
  Administered 2013-08-30 – 2013-09-01 (×3): 40 mg via SUBCUTANEOUS
  Filled 2013-08-29 (×3): qty 0.4

## 2013-08-29 MED ORDER — GLYCOPYRROLATE 0.2 MG/ML IJ SOLN
INTRAMUSCULAR | Status: AC
  Filled 2013-08-29: qty 1

## 2013-08-29 MED ORDER — FENTANYL CITRATE 0.05 MG/ML IJ SOLN: 25.0000 ug | INTRAMUSCULAR | Status: DC | PRN

## 2013-08-29 MED ORDER — IPRATROPIUM BROMIDE 0.02 % IN SOLN: 0.5000 mg | Freq: Four times a day (QID) | RESPIRATORY_TRACT | Status: DC | PRN

## 2013-08-29 MED ORDER — ATORVASTATIN CALCIUM 80 MG PO TABS
80.0000 mg | ORAL_TABLET | Freq: Every evening | ORAL | Status: DC
  Administered 2013-08-29 – 2013-09-03 (×5): 80 mg via ORAL
  Filled 2013-08-29 (×7): qty 1

## 2013-08-29 MED ORDER — OXYCODONE HCL 5 MG/5ML PO SOLN
ORAL | Status: AC
  Filled 2013-08-29: qty 5

## 2013-08-29 MED ORDER — PHENOL 1.4 % MT LIQD
1.0000 | OROMUCOSAL | Status: DC | PRN
Start: 2013-08-29 — End: 2013-09-04

## 2013-08-29 MED ORDER — DOCUSATE SODIUM 100 MG PO CAPS
100.0000 mg | ORAL_CAPSULE | Freq: Two times a day (BID) | ORAL | Status: DC
  Administered 2013-08-29 – 2013-09-04 (×12): 100 mg via ORAL
  Filled 2013-08-29 (×15): qty 1

## 2013-08-29 MED ORDER — ATROPINE SULFATE 0.1 MG/ML IJ SOLN
INTRAMUSCULAR | Status: DC | PRN
  Administered 2013-08-29: .4 mg via INTRAVENOUS

## 2013-08-29 MED ORDER — TEMAZEPAM 15 MG PO CAPS
15.0000 mg | ORAL_CAPSULE | Freq: Every evening | ORAL | Status: DC | PRN
  Administered 2013-08-29 – 2013-09-03 (×6): 15 mg via ORAL
  Filled 2013-08-29 (×6): qty 1

## 2013-08-29 MED ORDER — EPHEDRINE SULFATE 50 MG/ML IJ SOLN
INTRAMUSCULAR | Status: DC | PRN
  Administered 2013-08-29 (×3): 10 mg via INTRAVENOUS

## 2013-08-29 MED ORDER — EPHEDRINE SULFATE 50 MG/ML IJ SOLN
10.0000 mg | INTRAMUSCULAR | Status: AC | PRN
  Administered 2013-08-29 (×3): 10 mg via INTRAVENOUS

## 2013-08-29 MED ORDER — GLYCOPYRROLATE 0.2 MG/ML IJ SOLN
0.1000 mg | Freq: Once | INTRAMUSCULAR | Status: AC
  Administered 2013-08-29: 0.1 mg via INTRAVENOUS

## 2013-08-29 MED ORDER — GLYCOPYRROLATE 0.2 MG/ML IJ SOLN
INTRAMUSCULAR | Status: AC
  Administered 2013-08-29: 0.1 mg via INTRAVENOUS
  Filled 2013-08-29: qty 1

## 2013-08-29 MED ORDER — METOPROLOL TARTRATE 12.5 MG HALF TABLET
12.5000 mg | ORAL_TABLET | Freq: Two times a day (BID) | ORAL | Status: DC
  Administered 2013-08-29 – 2013-09-04 (×11): 12.5 mg via ORAL
  Filled 2013-08-29 (×14): qty 1

## 2013-08-29 MED ORDER — GLYCOPYRROLATE 0.2 MG/ML IJ SOLN
INTRAMUSCULAR | Status: DC | PRN
  Administered 2013-08-29: 0.4 mg via INTRAVENOUS

## 2013-08-29 MED ORDER — LACTATED RINGERS IV SOLN
INTRAVENOUS | Status: DC | PRN
  Administered 2013-08-29: 12:00:00 via INTRAVENOUS

## 2013-08-29 MED ORDER — CARBAMAZEPINE ER 200 MG PO TB12
300.0000 mg | ORAL_TABLET | Freq: Two times a day (BID) | ORAL | Status: DC
  Administered 2013-08-29 – 2013-09-04 (×12): 300 mg via ORAL
  Filled 2013-08-29 (×14): qty 1

## 2013-08-29 MED ORDER — PROPOFOL 10 MG/ML IV BOLUS
INTRAVENOUS | Status: DC | PRN
  Administered 2013-08-29: 100 mg via INTRAVENOUS

## 2013-08-29 MED ORDER — GLYCOPYRROLATE 0.2 MG/ML IJ SOLN
0.3000 mg | Freq: Once | INTRAMUSCULAR | Status: AC
  Administered 2013-08-29: 0.3 mg via INTRAVENOUS

## 2013-08-29 MED ORDER — MAGNESIUM CITRATE PO SOLN: 1.0000 | Freq: Once | ORAL | Status: AC | PRN

## 2013-08-29 MED ORDER — OXYCODONE HCL 5 MG/5ML PO SOLN
5.0000 mg | Freq: Once | ORAL | Status: AC | PRN
  Administered 2013-08-29: 5 mg via ORAL

## 2013-08-29 MED ORDER — METOCLOPRAMIDE HCL 5 MG/ML IJ SOLN: 5.0000 mg | Freq: Three times a day (TID) | INTRAMUSCULAR | Status: DC | PRN

## 2013-08-29 MED ORDER — HYDROMORPHONE HCL PF 1 MG/ML IJ SOLN: 0.5000 mg | INTRAMUSCULAR | Status: AC | PRN

## 2013-08-29 MED ORDER — OXYCODONE HCL 5 MG PO TABS
2.5000 mg | ORAL_TABLET | ORAL | Status: DC | PRN
  Administered 2013-08-29: 5 mg via ORAL
  Filled 2013-08-29: qty 1

## 2013-08-29 MED ORDER — NEOSTIGMINE METHYLSULFATE 1 MG/ML IJ SOLN
INTRAMUSCULAR | Status: DC | PRN
  Administered 2013-08-29: 2 mg via INTRAVENOUS

## 2013-08-29 MED ORDER — METHOCARBAMOL 100 MG/ML IJ SOLN
500.0000 mg | Freq: Four times a day (QID) | INTRAVENOUS | Status: DC | PRN
  Filled 2013-08-29: qty 5

## 2013-08-29 MED ORDER — SORBITOL 70 % SOLN: 30.0000 mL | Freq: Every day | Status: DC | PRN

## 2013-08-29 MED ORDER — FENTANYL CITRATE 0.05 MG/ML IJ SOLN
INTRAMUSCULAR | Status: DC | PRN
  Administered 2013-08-29 (×2): 25 ug via INTRAVENOUS
  Administered 2013-08-29: 75 ug via INTRAVENOUS

## 2013-08-29 MED ORDER — SORBITOL 70 % SOLN
30.0000 mL | Freq: Every day | Status: DC | PRN
  Administered 2013-09-01: 30 mL via ORAL
  Filled 2013-08-29: qty 30

## 2013-08-29 MED ORDER — ASPIRIN 81 MG PO CHEW
81.0000 mg | CHEWABLE_TABLET | Freq: Every day | ORAL | Status: DC
  Administered 2013-08-30 – 2013-09-04 (×6): 81 mg via ORAL
  Filled 2013-08-29 (×7): qty 1

## 2013-08-29 MED ORDER — ACETAMINOPHEN 325 MG PO TABS
650.0000 mg | ORAL_TABLET | Freq: Three times a day (TID) | ORAL | Status: DC
  Administered 2013-08-29 – 2013-09-04 (×15): 650 mg via ORAL
  Filled 2013-08-29 (×15): qty 2

## 2013-08-29 MED ORDER — BUDESONIDE-FORMOTEROL FUMARATE 80-4.5 MCG/ACT IN AERO
2.0000 | INHALATION_SPRAY | Freq: Two times a day (BID) | RESPIRATORY_TRACT | Status: DC
  Administered 2013-08-29 – 2013-09-04 (×13): 2 via RESPIRATORY_TRACT
  Filled 2013-08-29: qty 6.9

## 2013-08-29 MED ORDER — OXYCODONE HCL 5 MG PO TABS: 5.0000 mg | ORAL_TABLET | ORAL | Status: DC | PRN

## 2013-08-29 MED ORDER — ACETAMINOPHEN 650 MG RE SUPP: 650.0000 mg | Freq: Four times a day (QID) | RECTAL | Status: DC | PRN

## 2013-08-29 MED ORDER — FINASTERIDE 5 MG PO TABS
5.0000 mg | ORAL_TABLET | Freq: Every day | ORAL | Status: DC
  Administered 2013-08-29 – 2013-09-04 (×7): 5 mg via ORAL
  Filled 2013-08-29 (×7): qty 1

## 2013-08-29 MED ORDER — METHOCARBAMOL 500 MG PO TABS
500.0000 mg | ORAL_TABLET | Freq: Four times a day (QID) | ORAL | Status: DC | PRN
  Administered 2013-08-29: 500 mg via ORAL
  Filled 2013-08-29 (×2): qty 1

## 2013-08-29 MED ORDER — SENNA 8.6 MG PO TABS
1.0000 | ORAL_TABLET | Freq: Two times a day (BID) | ORAL | Status: DC
  Administered 2013-08-29: 8.6 mg via ORAL
  Filled 2013-08-29 (×3): qty 1

## 2013-08-29 MED ORDER — MORPHINE SULFATE 2 MG/ML IJ SOLN: 0.5000 mg | INTRAMUSCULAR | Status: DC | PRN

## 2013-08-29 MED ORDER — LIDOCAINE HCL (CARDIAC) 20 MG/ML IV SOLN
INTRAVENOUS | Status: DC | PRN
  Administered 2013-08-29: 60 mg via INTRAVENOUS

## 2013-08-29 MED ORDER — CEFAZOLIN SODIUM-DEXTROSE 2-3 GM-% IV SOLR
2.0000 g | Freq: Four times a day (QID) | INTRAVENOUS | Status: AC
  Administered 2013-08-29 – 2013-08-30 (×3): 2 g via INTRAVENOUS
  Filled 2013-08-29 (×4): qty 50

## 2013-08-29 SURGICAL SUPPLY — 40 items
BANDAGE GAUZE ELAST BULKY 4 IN (GAUZE/BANDAGES/DRESSINGS) ×2 IMPLANT
BLADE SURG 15 STRL LF DISP TIS (BLADE) ×1 IMPLANT
BLADE SURG 15 STRL SS (BLADE) ×2
BNDG COHESIVE 4X5 TAN NS LF (GAUZE/BANDAGES/DRESSINGS) ×2 IMPLANT
CLOTH BEACON ORANGE TIMEOUT ST (SAFETY) ×2 IMPLANT
COVER SURGICAL LIGHT HANDLE (MISCELLANEOUS) ×2 IMPLANT
DRAPE PROXIMA HALF (DRAPES) IMPLANT
DRAPE STERI IOBAN 125X83 (DRAPES) ×2 IMPLANT
DRSG MEPILEX BORDER 4X4 (GAUZE/BANDAGES/DRESSINGS) ×1 IMPLANT
DRSG MEPILEX BORDER 4X8 (GAUZE/BANDAGES/DRESSINGS) ×2 IMPLANT
DRSG PAD ABDOMINAL 8X10 ST (GAUZE/BANDAGES/DRESSINGS) ×4 IMPLANT
DURAPREP 26ML APPLICATOR (WOUND CARE) ×2 IMPLANT
ELECT CAUTERY BLADE 6.4 (BLADE) ×2 IMPLANT
ELECT REM PT RETURN 9FT ADLT (ELECTROSURGICAL) ×2
ELECTRODE REM PT RTRN 9FT ADLT (ELECTROSURGICAL) ×1 IMPLANT
FACESHIELD LNG OPTICON STERILE (SAFETY) ×2 IMPLANT
GAUZE XEROFORM 5X9 LF (GAUZE/BANDAGES/DRESSINGS) ×2 IMPLANT
GLOVE SURG SS PI 7.5 STRL IVOR (GLOVE) ×4 IMPLANT
GOWN STRL NON-REIN LRG LVL3 (GOWN DISPOSABLE) ×2 IMPLANT
GOWN STRL REIN XL XLG (GOWN DISPOSABLE) ×2 IMPLANT
GUIDE PIN 3.2MM (MISCELLANEOUS) ×2
GUIDE PIN ORTH 343X3.2XBRAD (MISCELLANEOUS) IMPLANT
KIT BASIN OR (CUSTOM PROCEDURE TRAY) ×2 IMPLANT
KIT ROOM TURNOVER OR (KITS) ×2 IMPLANT
MANIFOLD NEPTUNE II (INSTRUMENTS) ×2 IMPLANT
NAIL TRIGEN INTERT 11.5X40-125 (Nail) ×1 IMPLANT
NS IRRIG 1000ML POUR BTL (IV SOLUTION) ×2 IMPLANT
PACK GENERAL/GYN (CUSTOM PROCEDURE TRAY) ×2 IMPLANT
PAD ARMBOARD 7.5X6 YLW CONV (MISCELLANEOUS) ×4 IMPLANT
PAD CAST 4YDX4 CTTN HI CHSV (CAST SUPPLIES) ×2 IMPLANT
PADDING CAST COTTON 4X4 STRL (CAST SUPPLIES) ×4
SCREW LAG COMPR KIT 95/90 (Screw) ×1 IMPLANT
STAPLER VISISTAT 35W (STAPLE) ×2 IMPLANT
SUT VIC AB 0 CT1 27 (SUTURE) ×4
SUT VIC AB 0 CT1 27XBRD ANBCTR (SUTURE) ×2 IMPLANT
SUT VIC AB 2-0 CT1 27 (SUTURE) ×4
SUT VIC AB 2-0 CT1 TAPERPNT 27 (SUTURE) ×2 IMPLANT
TOWEL OR 17X24 6PK STRL BLUE (TOWEL DISPOSABLE) ×2 IMPLANT
TOWEL OR 17X26 10 PK STRL BLUE (TOWEL DISPOSABLE) ×2 IMPLANT
WATER STERILE IRR 1000ML POUR (IV SOLUTION) ×2 IMPLANT

## 2013-08-29 NOTE — ED Notes (Signed)
[facility] called for transport. 

## 2013-08-29 NOTE — H&P (Signed)
[facility] History and Physical  NIAL HAWE ZOX:[id number] DOB: 02-21-1925 DOA: 08/28/2013  Referring physician: Renne Crigler PA-C PCP: Evette Georges, MD [facility]  Chief Complaint: Fall and right hip pain  HPI: [name] is a 79 y.o. male  The patient has extensive coronary disease and ischemic cardiomyopathy s/p CABG 1986 and redo 1993.  He experienced cardiac arrest in Nov 2013 which was ischemic in origin requiring PCI.  Chronically on ASA/Plavix.  Does use oxygen 2L at home presumably for HF (he denies a diagnosis of OSA).  The patient is presenting after a fall that experienced as he was transferring out of a vehicle.  He was attempting to use his walker when he met a discontinuity in the pavement, leading his right hip to cross over his left and landing on his right hip.  He attempted to have a neighbor help him up but discovered that he could not do so due to pain.  He then was transported to the ED.  In general the patient does state that his functional status is fair; he is able to ambulate for 30 mins at a slow walk on level ground.  He does experience angina which he describes as an epigastric/chest "indigestion" feeling when he tries to walk up an incline.  He denies PND, orthopnea, or LE edema.  He rarely uses his nitroglycerin PRN.  Review of Systems:  Constitutional:  No weight loss, night sweats, Fevers, chills, fatigue.  HEENT:  No headaches, Difficulty swallowing,Tooth/dental problems,Sore throat,  No sneezing, itching, ear ache, nasal congestion, post nasal drip,  Cardio-vascular:  No chest pain (other than chronic intermittent angina provoked by walking uphill) , Orthopnea, PND, swelling in lower extremities (except for mildly increased RLE swelling after the injury), anasarca, dizziness, palpitations  GI:  No heartburn, indigestion, abdominal pain, nausea, vomiting, diarrhea, change in bowel habits, loss of appetite  Resp:  No shortness  of breath with exertion or at rest. No excess mucus, no productive cough, No non-productive cough, No coughing up of blood.No change in color of mucus.No wheezing.No chest wall deformity   Denies PND/orthopnea; sleeps with 2L O2 HS Skin:  no rash or lesions other than  GU:  no dysuria, change in color of urine, no urgency or frequency. No flank pain.  Musculoskeletal:   Psych:  No change in mood or affect. No depression or anxiety. No memory loss.  The remainder of a complete review of systems was reviewed and was negative.  Past Medical History  Diagnosis Date  . Systolic CHF     EF 40-45% by echo 07/18/12  . Dyslipidemia (high LDL; low HDL)   . Coronary artery disease     s/p CABG; Anterolateral STEMI 06/2012 s/p BMS to mid LAD   . Heart murmur   . Peripheral vascular disease     thrombus- in leg- many yrs. ago- ?R- coumadin x1 mth  . Pneumonia     hosp. long time ago   . COPD (chronic obstructive pulmonary disease)     dc'd on home O2 06/2012  . Blood transfusion     post CABG  . Hepatitis     hepatitis- long time ago, occupational contamination   . GERD (gastroesophageal reflux disease)   . Cancer     prostate - Ca- radiation therapy- 2002  . Peripheral neuropathy   . Hiatal hernia   . Arthritis     hnp, lumbar, ankle (foot drop-R), wears a splint, arthritis - "all  over", low bone density   . Depression     uses Zoloft   . VT (ventricular tachycardia) 06/2012    in the setting of STEMI  . Bacteremia 06/2012    in the setting of UTI/Prostatits, Blood Cx E.Coli tx w/ Bactrim  . Anemia   . Glucose intolerance (impaired glucose tolerance) 06/2012    A1C 5.8  . Prostatitis   . Myocardial infarction     several, the last was 06/2012  . H/O cardiac arrest     x2  . Anginal pain   . Hypertension   . Asthma   . History of kidney stones    Past Surgical History  Procedure Laterality Date  . Coronary artery bypass graft  1986 and 1993  . Ptca  2008  . Cardiac  catheterization      2010- last cath, two stents at that time  . Eye surgery      cataracts - bilateral , IOL  . Hernia repair      bilateral inguiinal repair   . Fracture surgery      heel- crushed -2002,  (hardware)   . Joint replacement      R knee- arthroscopic, x2, R great toe- 2 surgeries- /w hardware & thenlater fused by Dr. Lajoyce Corners    . Rhinoplasty  [id number]    as a teen, followed by same procedure at 1970's   . Lumbar laminectomy/decompression microdiscectomy  08/10/2011    Procedure: LUMBAR LAMINECTOMY/DECOMPRESSION MICRODISCECTOMY;  Surgeon: Kathaleen Maser Pool;  Location: [facility];  Service: Neurosurgery;  Laterality: Right;  RIGHT Lumbar five-sacral one LAMINECTOMY, MICRODISCECTOMY  . Lumbar wound debridement  08/22/2011    Procedure: LUMBAR WOUND DEBRIDEMENT;  Surgeon: Tia Alert;  Location: [facility];  Service: Neurosurgery;  Laterality: N/A;  Repair of CSF Leak requiring laminectomy  . Coronary angioplasty with stent placement  X 4  . Cystoscopy w/ ureteral stent placement Right 02/28/2013    Procedure: CYSTOSCOPY WITH RETROGRADE PYELOGRAM/URETERAL STENT PLACEMENT;  Surgeon: Crecencio Mc, MD;  Location: [facility];  Service: Urology;  Laterality: Right;  . Tonsillectomy  1940  . Appendectomy  1943  . Hammer toe surgery      right little toe  . Transurethral resection of prostate      x2  . Foot surgery Left 2000    heel fusion  . Toe surgery Right 2002  . Cystoscopy with ureteroscopy and stent placement Right 03/23/2013    Procedure: RIGHT URETEROSCOPY, LASER LITHO AND STENT PLACEMENT;  Surgeon: Antony Haste, MD;  Location: [facility];  Service: Urology;  Laterality: Right;  . Holmium laser application Right 03/23/2013    Procedure: HOLMIUM LASER APPLICATION;  Surgeon: Antony Haste, MD;  Location: [facility];  Service: Urology;  Laterality: Right;   Social History:  reports that he has never smoked. He has never used smokeless tobacco. He reports that he does not  drink alcohol or use illicit drugs.  No Known Allergies  Family History  Problem Relation Age of Onset  . Heart attack Mother   . Addison's disease Father   . Heart attack Sister   . Anesthesia problems Neg Hx   . Hypotension Neg Hx   . Malignant hyperthermia Neg Hx   . Pseudochol deficiency Neg Hx      Prior to Admission medications   Medication Sig Start Date End Date Taking? Authorizing Provider  acetaminophen (TYLENOL) 500 MG tablet Take 500 mg by mouth 2 (two) times daily.  Once in the am Once in the pm   Yes Historical Provider, MD  aspirin 81 MG tablet Take 81 mg by mouth daily.  10/27/12  Yes Cassell Clement, MD  atorvastatin (LIPITOR) 80 MG tablet Take 80 mg by mouth every evening.  10/27/12  Yes Cassell Clement, MD  budesonide-formoterol [facility]) 80-4.5 MCG/ACT inhaler Inhale 2 puffs into the lungs 2 (two) times daily. 09/12/12  Yes Nyoka Cowden, MD  carbamazepine (CARBATROL) 300 MG 12 hr capsule Take 300 mg by mouth 2 (two) times daily.    Yes Historical Provider, MD  clopidogrel (PLAVIX) 75 MG tablet Take 75 mg by mouth daily.     Yes Historical Provider, MD  finasteride (PROSCAR) 5 MG tablet Take 5 mg by mouth daily.    Yes Historical Provider, MD  furosemide (LASIX) 40 MG tablet Take 40 mg by mouth every morning.   Yes Historical Provider, MD  ipratropium (ATROVENT HFA) 17 MCG/ACT inhaler Inhale 2 puffs into the lungs 2 (two) times daily as needed for wheezing.    Yes Historical Provider, MD  isosorbide mononitrate (IMDUR) 60 MG 24 hr tablet Take 90 mg by mouth daily.   Yes Historical Provider, MD  metoprolol succinate (TOPROL-XL) 25 MG 24 hr tablet Take 25 mg by mouth every morning.   Yes Historical Provider, MD  NITROSTAT 0.4 MG SL tablet PLACE 1 TABLET UNDER THE TONGUE EVERY 5 MINUTES ASDIRECTED BY PHYSICIAN 06/18/13  Yes Cassell Clement, MD  potassium chloride SA (K-DUR,KLOR-CON) 20 MEQ tablet Take 20 mEq by mouth daily.    Yes Historical Provider, MD  ranitidine  (ZANTAC) 150 MG tablet Take 1 tablet (150 mg total) by mouth 2 (two) times daily. 05/12/13  Yes Cassell Clement, MD  sertraline (ZOLOFT) 100 MG tablet Take 150 mg by mouth every morning.    Yes Historical Provider, MD  temazepam (RESTORIL) 15 MG capsule Take 1 capsule (15 mg total) by mouth at bedtime as needed. 06/20/13  Yes Cassell Clement, MD   Physical Exam: Filed Vitals:   08/29/13 0030  BP: 139/60  Pulse: 64  Temp:   Resp: 12    BP 139/60  Pulse 64  Temp(Src) 98.3 F (36.8 C)  Resp 12  SpO2 96%  BP 139/60  Pulse 64  Temp(Src) 98.3 F (36.8 C)  Resp 12  SpO2 96%  General Appearance:    Alert, cooperative, no distress, appears stated age  Head:    Normocephalic, without obvious abnormality, atraumatic  Eyes:    PERRL, conjunctiva/corneas clear, EOM's intact  Nose:   Nares normal, septum midline, mucosa normal, no drainage    or sinus tenderness  Nasal cannula oxygen  Neck:   Supple, symmetrical, trachea midline, no adenopathy;       thyroid:  No enlargement/tenderness/nodules; no carotid   bruit or JVD, pulsations visible slightly (several cm) above clavicle at 45 degrees  Lungs:     Clear to auscultation bilaterally anteriorly, respirations unlabored  Chest wall:    No tenderness or deformity  Heart:    Regular rate and rhythm, S1 and S2 normal, no murmur, rub   or gallop, soft holosystolic murmur at sternal border and axilla  Abdomen:     Soft, non-tender, bowel sounds active all four quadrants,    no masses, no organomegaly  Extremities:   Extremities no cyanosis or edema; Right LE is held in slight internal rotation, skin overlying right hip unchanged but significant tenderness to palpation and soft tissue swelling, excess edema compared  with LLE  Pulses:   2+ and symmetric all extremities  Skin:   Skin color, texture, turgor normal, no rashes or lesions except Abrasion overlying left thenar region  Lymph nodes:   Cervical, supraclavicular, and axillary nodes normal   Neurologic:   CNII-XII intact. Normal strength, sensation and reflexes      throughout            Labs on Admission:  Basic Metabolic Panel:  Recent Labs Lab 08/28/13 2235  NA 135*  K 4.6  CL 100  CO2 26  GLUCOSE 117*  BUN 15  CREATININE 0.95  CALCIUM 9.5   Liver Function Tests: No results found for this basename: AST, ALT, ALKPHOS, BILITOT, PROT, ALBUMIN,  in the last 168 hours No results found for this basename: LIPASE, AMYLASE,  in the last 168 hours No results found for this basename: AMMONIA,  in the last 168 hours CBC:  Recent Labs Lab 08/28/13 2235  WBC 7.3  NEUTROABS 5.3  HGB 11.2*  HCT 33.4*  MCV 96.8  PLT 226   Cardiac Enzymes: No results found for this basename: CKTOTAL, CKMB, CKMBINDEX, TROPONINI,  in the last 168 hours  BNP (last 3 results) No results found for this basename: PROBNP,  in the last 8760 hours CBG: No results found for this basename: GLUCAP,  in the last 168 hours  Radiological Exams on Admission: Dg Chest 1 View  08/28/2013   CLINICAL DATA:  Right hip pain after a fall.  EXAM: CHEST - 1 VIEW  COMPARISON:  04/03/2013  FINDINGS: Stable appearance of postoperative change in the mediastinum. Heart size and pulmonary vascularity are normal for technique. Linear atelectasis or fibrosis in the right lung base is similar to previous study. Emphysematous change in the lungs. No focal consolidation or airspace disease. Tortuous aorta. Thoracic scoliosis. Stable appearance since previous study.  IMPRESSION: Emphysematous changes in the lungs with fibrosis in the right lung base.   Electronically Signed   By: Burman Nieves M.D.   On: 08/28/2013 22:24   Dg Hip Complete Right  08/28/2013   CLINICAL DATA:  Right hip pain after a fall.  EXAM: RIGHT HIP - COMPLETE 2+ VIEW  COMPARISON:  None.  FINDINGS: There is a comminuted intertrochanteric fracture of the right hip with mild displacement of a lesser trochanteric fragment and varus angulation of the  hip. No dislocation of the hip joint. Pelvis appears intact.  IMPRESSION: Intertrochanteric fracture of the right hip.   Electronically Signed   By: Burman Nieves M.D.   On: 08/28/2013 22:23   Dg Femur Right  08/28/2013   CLINICAL DATA:  Right hip pain.  EXAM: RIGHT FEMUR - 2 VIEW  COMPARISON:  DG HIP COMPLETE*R* dated 08/28/2013  FINDINGS: Right intertrochanteric femur fracture again demonstrated. There is no evidence of fracture of the distal right femur.  IMPRESSION: No distal femur fracture. Proximal right hip fracture described on comparison film   Electronically Signed   By: Genevive Bi M.D.   On: 08/28/2013 23:20    EKG: Independently reviewed.  V rate 65 with left bundle block  Assessment/Plan Active Problems:   Intertrochanteric fracture of right hip   Hip fracture  (hospital diagnoses bolded)  1. Intertrochanteric fracture of right hip.  Followed mechanical fall, no syncope or prodrome.    ED providers consulted Dr. Roda Shutters who suggested that the patient may be able to undergo surgery as early as 12/31 in the PM.  Transfer to [facility] as OR availability  may be more beneficial.  Surgery has requested that all anticoagulants/antiplatelets be discontinued; we would recommend continuing ASA 81 mg daily perioperatively if the surgical team is comfortable doing so.  I will hold Plavix as the patient's most recent PCI was > 12 months ago (and it was a bare metal stent) although the pharmacologic effect of this med will last about 5 days. 2. Perioperative cardiac risk assessment for intermediate risk (orthopedic) surgery.  The patient does have extensive coronary disease and sCHF.  Denies history of CVA, CKD, DM.  2 risk factors is RCRI Class III, which corresponds to periop risk of at least 2.4%.  No decompensated/active cardiac conditions as the patient is without chest pain in a euvolemic well perfused state.  Unclear functional capacity, cannot definitely say >4 METS based on self  reported exercise.  I had a long discussion with the patient explaining that his risks of adverse cardiac outcomes are elevated but he accepts this risk considering the poor functional outcome that would result from an unrepaired hip fracture.  Perioperatively, we will continue the patient's diuretic Lasix 40 mg daily and recommend cautious fluid administration intra-op  Incentive spirometry post op  Early ambulation as surgically appropriate  Cont beta blocker on day of surg: Dose metoprolol tartrate 12.5 mg bid (takes Toprol XL 25 mg daily at home)  Screening vitD level, replace if deficient  DVT proph: Lovenox or whatever is agreed upon in conjunction with surgery  Sch tylenol for pain  Oxycodone prn breakthrough for pain 3. Coronary artery disease and ischemic cardiomyopathy EF 45-50%.  Patient of Dr. Patty Sermons.  Cont ASA, hold Plavix as above.  Dose metoprolol tartrate 12.5 mg bid.  Cont long acting nitrates.  PRN NTG if chest pain. 4. Mild obstrc lung disease per PFTs 08/2012 and Nocturnal hypoxia cont 2L oxygen HS 5. Anemia to Hb 11.2.  Iron profile, replace if deficient. 6. BPH Foley in place periop, no catheter needed at baseline, cont finasteride 7. Debility, history of foot drop after back surgery complicated by CSF leak, will likely need post acute inpt rehab post surg  Ortho Dr. Roda Shutters called by ED.   if consultant consulted, please document name and whether formally or informally consulted  Code Status: Full is confirmed on admission  Family Communication: Not needed, pt with adequate decision making capacity.  Spouse is Hagen Bohorquez [phone]  Disposition Plan: Admit inpatient [facility] 3-5 days   Time spent: 50 mins  Maxie Barb [facility] Pager [phone]

## 2013-08-29 NOTE — H&P (Signed)
H&P update  The surgical history has been reviewed and remains accurate without interval change.  The patient was re-examined and patient's physiologic condition has not changed significantly in the last 30 days. The condition still exists that makes this procedure necessary. The treatment plan remains the same, without new options for care.  No new pharmacological allergies or types of therapy has been initiated that would change the plan or the appropriateness of the plan.  The patient and/or family understand the potential benefits and risks.  N. Michael Xu, MD 08/29/2013 8:21 AM   

## 2013-08-29 NOTE — Progress Notes (Signed)
Utilization review completed.  

## 2013-08-29 NOTE — Anesthesia Preprocedure Evaluation (Addendum)
Anesthesia Evaluation  Patient identified by MRN, date of birth, ID band Patient awake    Reviewed: Allergy & Precautions, H&P , NPO status , Patient's Chart, lab work & pertinent test results  Airway Mallampati: II TM Distance: >3 FB Neck ROM: Full    Dental   Pulmonary asthma , COPD + rhonchi         Cardiovascular hypertension, + angina + CAD, + Past MI, + CABG, + Peripheral Vascular Disease and +CHF + dysrhythmias Ventricular Tachycardia Rhythm:Regular Rate:Normal  lbbb   Neuro/Psych Depression  Neuromuscular disease    GI/Hepatic hiatal hernia, GERD-  ,(+) Hepatitis -  Endo/Other    Renal/GU      Musculoskeletal   Abdominal   Peds  Hematology  (+) anemia ,   Anesthesia Other Findings   Reproductive/Obstetrics                          Anesthesia Physical Anesthesia Plan  ASA: IV  Anesthesia Plan: General   Post-op Pain Management:    Induction: Intravenous  Airway Management Planned: Oral ETT  Additional Equipment:   Intra-op Plan:   Post-operative Plan: Extubation in OR  Informed Consent: I have reviewed the patients History and Physical, chart, labs and discussed the procedure including the risks, benefits and alternatives for the proposed anesthesia with the patient or authorized representative who has indicated his/her understanding and acceptance.     Plan Discussed with: CRNA and Surgeon  Anesthesia Plan Comments:         Anesthesia Quick Evaluation

## 2013-08-29 NOTE — Anesthesia Postprocedure Evaluation (Signed)
Anesthesia Post Note  Patient: [name]  Procedure(s) Performed: Procedure(s) (LRB): INTRAMEDULLARY (IM) NAIL INTERTROCHANTRIC (Right)  Anesthesia type: General  Patient location: PACU  Post pain: Pain level controlled and Adequate analgesia  Post assessment: Post-op Vital signs reviewed, Patient's Cardiovascular Status Stable, Respiratory Function Stable, Patent Airway and Pain level controlled  Last Vitals:  Filed Vitals:   08/29/13 1430  BP: 98/50  Pulse: 81  Temp:   Resp: 14    Post vital signs: Reviewed and stable  Level of consciousness: awake, alert  and oriented  Complications: No apparent anesthesia complications

## 2013-08-29 NOTE — Progress Notes (Signed)
Orthopedic Tech Progress Note Patient Details:  [name] 1925/04/01 [id number]  Ortho Devices Ortho Device/Splint Location: ohf on bed   [name] 08/29/2013, 7:31 PM

## 2013-08-29 NOTE — Transfer of Care (Signed)
Immediate Anesthesia Transfer of Care Note  Patient: [name]  Procedure(s) Performed: Procedure(s): INTRAMEDULLARY (IM) NAIL INTERTROCHANTRIC (Right)  Patient Location: PACU  Anesthesia Type:General  Level of Consciousness: awake, alert  and oriented  Airway & Oxygen Therapy: Patient Spontanous Breathing and Patient connected to nasal cannula oxygen  Post-op Assessment: Report given to PACU RN and Post -op Vital signs reviewed and stable  Post vital signs: Reviewed and stable  Complications: No apparent anesthesia complications

## 2013-08-29 NOTE — Progress Notes (Signed)
Pt Bp 94/43 Called on call Dr Dayna Ramus ordered to hold Lopressor 12.5mg  scheduled at 2200.

## 2013-08-29 NOTE — Op Note (Signed)
   Date of Surgery: 08/29/2013  INDICATIONS: Mr. Horen is a 79 y.o.-year-old male who was involved in a mechanical fall and sustained a right hip fracture (pertrochanteric-type). The risks and benefits of the procedure discussed with the patient and family prior to the procedure and all questions were answered; consent was obtained.  PREOPERATIVE DIAGNOSIS: right hip fracture (intertrochanteric-type)   POSTOPERATIVE DIAGNOSIS: Same   PROCEDURE: Treatment of intertrochanteric fracture with intramedullary implant. CPT [phone]   SURGEON: N. Glee Arvin, M.D.   ANESTHESIA: general   IV FLUIDS AND URINE: See anesthesia record   ESTIMATED BLOOD LOSS: 200 cc  IMPLANTS: [employer] InterTAN 11.5 x 40  DRAINS: None.   COMPLICATIONS: None.   DESCRIPTION OF PROCEDURE: The patient was brought to the operating room and placed supine on the operating table. The patient's leg had been signed prior to the procedure. The patient had the anesthesia placed by the anesthesiologist. The prep verification and incision time-outs were performed to confirm that this was the correct patient, site, side and location. The patient had an SCD on the opposite lower extremity. The patient did receive antibiotics prior to the incision and was re-dosed during the procedure as needed at indicated intervals. The patient was positioned on the fracture table with the table in traction and internal rotation to reduce the hip. The well leg was placed in a hemi-lithotomy position and all bony prominences were well-padded. The patient had the lower extremity prepped and draped in the standard surgical fashion. The incision was made 4 finger breadths superior to the greater trochanter. A guide pin was inserted into the tip of the greater trochanter under fluoroscopic guidance. An opening reamer was used to gain access to the femoral canal. The nail length was measured and inserted down the femoral canal to its proper depth. The  appropriate version of insertion for the lag screw was found under fluoroscopy. A pin was inserted up the femoral neck through the jig.  Then, a second antirotation pin was inserted inferior to the first pin.  The length of the lag screw was then measured.  The lag screw was inserted as near to center-center in the head as possible. The antirotation pin was then taken out and an interdigitating compression screw was placed in its place.  The leg was taken out of traction, then the interdigitating compression screw was used to compress across the fracture. Compression was visualized on serial xrays.  The wound was copiously irrigated with saline and the subcutaneous layer closed with 2.0 vicryl and the skin was reapproximated with staples. The wounds were cleaned and dried a final time and a sterile dressing was placed. The hip was taken through a range of motion at the end of the case under fluoroscopic imaging to visualize the approach-withdraw phenomenon and confirm implant length in the head. The patient was then awakened from anesthesia and taken to the recovery room in stable condition. All counts were correct at the end of the case.   POSTOPERATIVE PLAN: The patient will be weight bearing as tolerated and will return in 2 weeks for staple removal and the patient will receive DVT prophylaxis based on other medications, activity level, and risk ratio of bleeding to thrombosis.  Patient may resume his plavix Thursday morning.  Mayra Reel, MD [facility] [phone] 1:25 PM

## 2013-08-29 NOTE — Consult Note (Signed)
ORTHOPAEDIC CONSULTATION  REQUESTING PHYSICIAN: Stephani Police, PA-C  Chief Complaint: right hip pain  HPI: [name] is a 79 y.o. male who complains of right hip fracture s/p mechanical fall while transferring out of a car.  Denies LOC, neck pain, abd pain.  Does have significant heart history with multiple surgeries.  Currently on plavix and asa.  Past Medical History  Diagnosis Date  . Systolic CHF     EF 40-45% by echo 07/18/12  . Dyslipidemia (high LDL; low HDL)   . Coronary artery disease     s/p CABG; Anterolateral STEMI 06/2012 s/p BMS to mid LAD   . Heart murmur   . Peripheral vascular disease     thrombus- in leg- many yrs. ago- ?R- coumadin x1 mth  . Pneumonia     hosp. long time ago   . COPD (chronic obstructive pulmonary disease)     dc'd on home O2 06/2012  . Blood transfusion     post CABG  . Hepatitis     hepatitis- long time ago, occupational contamination   . GERD (gastroesophageal reflux disease)   . Cancer     prostate - Ca- radiation therapy- 2002  . Peripheral neuropathy   . Hiatal hernia   . Arthritis     hnp, lumbar, ankle (foot drop-R), wears a splint, arthritis - "all over", low bone density   . Depression     uses Zoloft   . VT (ventricular tachycardia) 06/2012    in the setting of STEMI  . Bacteremia 06/2012    in the setting of UTI/Prostatits, Blood Cx E.Coli tx w/ Bactrim  . Anemia   . Glucose intolerance (impaired glucose tolerance) 06/2012    A1C 5.8  . Prostatitis   . Myocardial infarction     several, the last was 06/2012  . H/O cardiac arrest     x2  . Anginal pain   . Hypertension   . Asthma   . History of kidney stones    Past Surgical History  Procedure Laterality Date  . Coronary artery bypass graft  1986 and 1993  . Ptca  2008  . Cardiac catheterization      2010- last cath, two stents at that time  . Eye surgery      cataracts - bilateral , IOL  . Hernia repair      bilateral inguiinal repair   .  Fracture surgery      heel- crushed -2002,  (hardware)   . Joint replacement      R knee- arthroscopic, x2, R great toe- 2 surgeries- /w hardware & thenlater fused by Dr. Lajoyce Corners    . Rhinoplasty  1914,7829    as a teen, followed by same procedure at 1970's   . Lumbar laminectomy/decompression microdiscectomy  08/10/2011    Procedure: LUMBAR LAMINECTOMY/DECOMPRESSION MICRODISCECTOMY;  Surgeon: Kathaleen Maser Pool;  Location: [facility];  Service: Neurosurgery;  Laterality: Right;  RIGHT Lumbar five-sacral one LAMINECTOMY, MICRODISCECTOMY  . Lumbar wound debridement  08/22/2011    Procedure: LUMBAR WOUND DEBRIDEMENT;  Surgeon: Tia Alert;  Location: [facility];  Service: Neurosurgery;  Laterality: N/A;  Repair of CSF Leak requiring laminectomy  . Coronary angioplasty with stent placement  X 4  . Cystoscopy w/ ureteral stent placement Right 02/28/2013    Procedure: CYSTOSCOPY WITH RETROGRADE PYELOGRAM/URETERAL STENT PLACEMENT;  Surgeon: Crecencio Mc, MD;  Location: [facility];  Service: Urology;  Laterality: Right;  . Tonsillectomy  1940  .  Appendectomy  1943  . Hammer toe surgery      right little toe  . Transurethral resection of prostate      x2  . Foot surgery Left 2000    heel fusion  . Toe surgery Right 2002  . Cystoscopy with ureteroscopy and stent placement Right 03/23/2013    Procedure: RIGHT URETEROSCOPY, LASER LITHO AND STENT PLACEMENT;  Surgeon: Antony Haste, MD;  Location: [facility];  Service: Urology;  Laterality: Right;  . Holmium laser application Right 03/23/2013    Procedure: HOLMIUM LASER APPLICATION;  Surgeon: Antony Haste, MD;  Location: [facility];  Service: Urology;  Laterality: Right;   History   Social History  . Marital Status: Married    Spouse Name: N/A    Number of Children: N/A  . Years of Education: N/A   Social History Main Topics  . Smoking status: Never Smoker   . Smokeless tobacco: Never Used  . Alcohol Use: No  . Drug Use: No  . Sexual  Activity: None   Other Topics Concern  . None   Social History Narrative  . None   Family History  Problem Relation Age of Onset  . Heart attack Mother   . Addison's disease Father   . Heart attack Sister   . Anesthesia problems Neg Hx   . Hypotension Neg Hx   . Malignant hyperthermia Neg Hx   . Pseudochol deficiency Neg Hx    No Known Allergies Prior to Admission medications   Medication Sig Start Date End Date Taking? Authorizing Provider  acetaminophen (TYLENOL) 500 MG tablet Take 500 mg by mouth 2 (two) times daily. Once in the am Once in the pm   Yes Historical Provider, MD  aspirin 81 MG tablet Take 81 mg by mouth daily.  10/27/12  Yes Cassell Clement, MD  atorvastatin (LIPITOR) 80 MG tablet Take 80 mg by mouth every evening.  10/27/12  Yes Cassell Clement, MD  budesonide-formoterol [facility]) 80-4.5 MCG/ACT inhaler Inhale 2 puffs into the lungs 2 (two) times daily. 09/12/12  Yes Nyoka Cowden, MD  carbamazepine (CARBATROL) 300 MG 12 hr capsule Take 300 mg by mouth 2 (two) times daily.    Yes Historical Provider, MD  clopidogrel (PLAVIX) 75 MG tablet Take 75 mg by mouth daily.     Yes Historical Provider, MD  finasteride (PROSCAR) 5 MG tablet Take 5 mg by mouth daily.    Yes Historical Provider, MD  furosemide (LASIX) 40 MG tablet Take 40 mg by mouth every morning.   Yes Historical Provider, MD  ipratropium (ATROVENT HFA) 17 MCG/ACT inhaler Inhale 2 puffs into the lungs 2 (two) times daily as needed for wheezing.    Yes Historical Provider, MD  isosorbide mononitrate (IMDUR) 60 MG 24 hr tablet Take 90 mg by mouth daily.   Yes Historical Provider, MD  metoprolol succinate (TOPROL-XL) 25 MG 24 hr tablet Take 25 mg by mouth every morning.   Yes Historical Provider, MD  NITROSTAT 0.4 MG SL tablet PLACE 1 TABLET UNDER THE TONGUE EVERY 5 MINUTES ASDIRECTED BY PHYSICIAN 06/18/13  Yes Cassell Clement, MD  potassium chloride SA (K-DUR,KLOR-CON) 20 MEQ tablet Take 20 mEq by mouth  daily.    Yes Historical Provider, MD  ranitidine (ZANTAC) 150 MG tablet Take 1 tablet (150 mg total) by mouth 2 (two) times daily. 05/12/13  Yes Cassell Clement, MD  sertraline (ZOLOFT) 100 MG tablet Take 150 mg by mouth every morning.    Yes Historical  Provider, MD  temazepam (RESTORIL) 15 MG capsule Take 1 capsule (15 mg total) by mouth at bedtime as needed. 06/20/13  Yes Cassell Clement, MD   Dg Chest 1 View  08/28/2013   CLINICAL DATA:  Right hip pain after a fall.  EXAM: CHEST - 1 VIEW  COMPARISON:  04/03/2013  FINDINGS: Stable appearance of postoperative change in the mediastinum. Heart size and pulmonary vascularity are normal for technique. Linear atelectasis or fibrosis in the right lung base is similar to previous study. Emphysematous change in the lungs. No focal consolidation or airspace disease. Tortuous aorta. Thoracic scoliosis. Stable appearance since previous study.  IMPRESSION: Emphysematous changes in the lungs with fibrosis in the right lung base.   Electronically Signed   By: Burman Nieves M.D.   On: 08/28/2013 22:24   Dg Hip Complete Right  08/28/2013   CLINICAL DATA:  Right hip pain after a fall.  EXAM: RIGHT HIP - COMPLETE 2+ VIEW  COMPARISON:  None.  FINDINGS: There is a comminuted intertrochanteric fracture of the right hip with mild displacement of a lesser trochanteric fragment and varus angulation of the hip. No dislocation of the hip joint. Pelvis appears intact.  IMPRESSION: Intertrochanteric fracture of the right hip.   Electronically Signed   By: Burman Nieves M.D.   On: 08/28/2013 22:23   Dg Femur Right  08/28/2013   CLINICAL DATA:  Right hip pain.  EXAM: RIGHT FEMUR - 2 VIEW  COMPARISON:  DG HIP COMPLETE*R* dated 08/28/2013  FINDINGS: Right intertrochanteric femur fracture again demonstrated. There is no evidence of fracture of the distal right femur.  IMPRESSION: No distal femur fracture. Proximal right hip fracture described on comparison film    Electronically Signed   By: Genevive Bi M.D.   On: 08/28/2013 23:20    Positive ROS: All other systems have been reviewed and were otherwise negative with the exception of those mentioned in the HPI and as above.  Physical Exam: General: Alert, no acute distress Cardiovascular: No pedal edema Respiratory: No cyanosis, no use of accessory musculature GI: No organomegaly, abdomen is soft and non-tender Skin: No lesions in the area of chief complaint Neurologic: Sensation intact distally Psychiatric: Patient is competent for consent with normal mood and affect Lymphatic: No axillary or cervical lymphadenopathy  MUSCULOSKELETAL:  RLE - limited ROM due to pain and injury - NVI distally - foot drop at baseline from prior surgery  Assessment: Right hip fracture  Plan: - NPO - plan for surgery today - r/b/a discussed, all questions encouraged and answered - continuing asa is fine - appreciate medicine assistance with medical issues  Thank you for the consult and the opportunity to see [name]. Glee Arvin, MD [facility] [phone] 8:07 AM

## 2013-08-29 NOTE — Progress Notes (Signed)
[facility] PROGRESS NOTE  [name] XBJ:[id number] DOB: 1925-05-20 DOA: 08/28/2013 PCP: Evette Georges, MD  Assessment/Plan:  Right hip fracture Mechanical fall For surgery this afternoon with Dr. Roda Shutters.  Hx of MI, Cardiac arrest x 2 and chf. Appears euvolemic Echo in 6/14 showed and EF of 55% Dr. Patty Sermons is his cardiologist Continue lasix, imdur, metoprolol, statin. Plavix held, Aspirin 81 mg continued.  Peripheral neuropathy Take carbamazepine at home. This was held at admission. To be restarted after surgery.  Asthma On symbicort, atrovent Stable.   DVT Prophylaxis:  Per Ortho  Code Status: full code but does not want long term life support. Family Communication: wife at bedside Disposition Plan: Inpatient    Consultants:  Ortho  Procedures:  Surgery planned for 12/31  Antibiotics:    HPI/Subjective: Patient is complaining of pain in his hip, but otherwise states he feels fine.  No recent illness, good appetite, no fever, vomiting, diarrhea.  Very stable at home.  Objective: Filed Vitals:   08/29/13 0030 08/29/13 0100 08/29/13 0428 08/29/13 0634  BP: 139/60 116/51  110/52  Pulse: 64 64  58  Temp:    98.3 F (36.8 C)  TempSrc:    Oral  Resp: 12 17  18   Height:   5\' 10"  (1.778 m)   Weight:   71.668 kg (158 lb)   SpO2: 96% 93%  98%    Intake/Output Summary (Last 24 hours) at 08/29/13 0951 Last data filed at 08/29/13 3086  Gross per 24 hour  Intake      0 ml  Output    350 ml  Net   -350 ml   Filed Weights   08/29/13 0428  Weight: 71.668 kg (158 lb)    Exam: General: Well developed, well nourished, NAD, appears stated age  HEENT:  PERR, EOMI, Anicteic Sclera, MMM. No pharyngeal erythema or exudates  Neck: Supple, no JVD, no masses  Cardiovascular: RRR, S1 S2 auscultated, no rubs, murmurs or gallops.   Respiratory: Clear to auscultation bilaterally with equal chest rise  Abdomen: Soft, nontender, nondistended, + bowel  sounds  Extremities: warm dry without cyanosis clubbing or edema.  Skin: Without rashes exudates or nodules.  Dry skin Psych: Normal affect and demeanor with intact judgement and insight       Data Reviewed: Basic Metabolic Panel:  Recent Labs Lab 08/28/13 2235 08/29/13 0255  NA 135* 141  K 4.6 4.2  CL 100 104  CO2 26 27  GLUCOSE 117* 110*  BUN 15 14  CREATININE 0.95 0.91  CALCIUM 9.5 8.8   Liver Function Tests:    CBC:  Recent Labs Lab 08/28/13 2235 08/29/13 0255  WBC 7.3 8.8  NEUTROABS 5.3  --   HGB 11.2* 10.7*  HCT 33.4* 31.6*  MCV 96.8 96.9  PLT 226 205    No results found for this or any previous visit (from the past 240 hour(s)).   Studies: Dg Chest 1 View  08/28/2013   CLINICAL DATA:  Right hip pain after a fall.  EXAM: CHEST - 1 VIEW  COMPARISON:  04/03/2013  FINDINGS: Stable appearance of postoperative change in the mediastinum. Heart size and pulmonary vascularity are normal for technique. Linear atelectasis or fibrosis in the right lung base is similar to previous study. Emphysematous change in the lungs. No focal consolidation or airspace disease. Tortuous aorta. Thoracic scoliosis. Stable appearance since previous study.  IMPRESSION: Emphysematous changes in the lungs with fibrosis in the right lung base.   Electronically  Signed   By: Burman Nieves M.D.   On: 08/28/2013 22:24   Dg Hip Complete Right  08/28/2013   CLINICAL DATA:  Right hip pain after a fall.  EXAM: RIGHT HIP - COMPLETE 2+ VIEW  COMPARISON:  None.  FINDINGS: There is a comminuted intertrochanteric fracture of the right hip with mild displacement of a lesser trochanteric fragment and varus angulation of the hip. No dislocation of the hip joint. Pelvis appears intact.  IMPRESSION: Intertrochanteric fracture of the right hip.   Electronically Signed   By: Burman Nieves M.D.   On: 08/28/2013 22:23   Dg Femur Right  08/28/2013   CLINICAL DATA:  Right hip pain.  EXAM: RIGHT FEMUR -  2 VIEW  COMPARISON:  DG HIP COMPLETE*R* dated 08/28/2013  FINDINGS: Right intertrochanteric femur fracture again demonstrated. There is no evidence of fracture of the distal right femur.  IMPRESSION: No distal femur fracture. Proximal right hip fracture described on comparison film   Electronically Signed   By: Genevive Bi M.D.   On: 08/28/2013 23:20    Scheduled Meds: . acetaminophen  650 mg Oral Q8H  . aspirin  81 mg Oral Daily  . atorvastatin  80 mg Oral QPM  . budesonide-formoterol  2 puff Inhalation BID  . carbamazepine  300 mg Oral BID  . docusate sodium  100 mg Oral BID  . [START ON 08/30/2013] enoxaparin (LOVENOX) injection  40 mg Subcutaneous Q24H  . famotidine  20 mg Oral Daily  . finasteride  5 mg Oral Daily  . furosemide  40 mg Oral q morning - 10a  . isosorbide mononitrate  90 mg Oral Daily  . metoprolol tartrate  12.5 mg Oral BID  . potassium chloride SA  20 mEq Oral Daily  . senna  1 tablet Oral BID  . sertraline  150 mg Oral q morning - 10a   Continuous Infusions:   Active Problems:   HYPERLIPIDEMIA   HYPERTENSION   GERD   Chronic asthma   Intertrochanteric fracture of right hip   Hip fracture   COPD (chronic obstructive pulmonary disease)   H/O cardiac arrest    Conley Canal  [facility] Pager [phone]. If 7PM-7AM, please contact night-coverage at [email or web address], password [facility] 08/29/2013, 9:51 AM  LOS: 1 day

## 2013-08-30 DIAGNOSIS — R42 Dizziness and giddiness: Secondary | ICD-10-CM

## 2013-08-30 DIAGNOSIS — I951 Orthostatic hypotension: Secondary | ICD-10-CM

## 2013-08-30 DIAGNOSIS — J449 Chronic obstructive pulmonary disease, unspecified: Secondary | ICD-10-CM

## 2013-08-30 LAB — BASIC METABOLIC PANEL
BUN: 18 mg/dL (ref 6–23)
CO2: 26 mEq/L (ref 19–32)
Calcium: 8.5 mg/dL (ref 8.4–10.5)
Chloride: 100 mEq/L (ref 96–112)
Creatinine, Ser: 1.18 mg/dL (ref 0.50–1.35)
GFR calc Af Amer: 63 mL/min — ABNORMAL LOW (ref >90)
GFR calc non Af Amer: 54 mL/min — ABNORMAL LOW (ref >90)
Glucose, Bld: 155 mg/dL — ABNORMAL HIGH (ref 70–99)
Potassium: 4.2 mEq/L (ref 3.7–5.3)
Sodium: 139 mEq/L (ref 137–147)

## 2013-08-30 LAB — CBC
HCT: 25.8 % — ABNORMAL LOW (ref 39.0–52.0)
Hemoglobin: 8.7 g/dL — ABNORMAL LOW (ref 13.0–17.0)
MCH: 33 pg (ref 26.0–34.0)
MCHC: 33.7 g/dL (ref 30.0–36.0)
MCV: 97.7 fL (ref 78.0–100.0)
Platelets: 197 10*3/uL (ref 150–400)
RBC: 2.64 MIL/uL — ABNORMAL LOW (ref 4.22–5.81)
RDW: 13.9 % (ref 11.5–15.5)
WBC: 8.3 10*3/uL (ref 4.0–10.5)

## 2013-08-30 LAB — VITAMIN D 25 HYDROXY (VIT D DEFICIENCY, FRACTURES): Vit D, 25-Hydroxy: 38 ng/mL (ref 30–89)

## 2013-08-30 LAB — TROPONIN I: Troponin I: <0.3 ng/mL (ref <0.30)

## 2013-08-30 LAB — PREPARE RBC (CROSSMATCH)

## 2013-08-30 MED ORDER — NITROGLYCERIN 0.4 MG SL SUBL
0.4000 mg | SUBLINGUAL_TABLET | SUBLINGUAL | Status: DC | PRN
  Administered 2013-08-30 (×2): 0.4 mg via SUBLINGUAL

## 2013-08-30 NOTE — Progress Notes (Signed)
Subjective: 1 Day Post-Op Procedure(s) (LRB): INTRAMEDULLARY (IM) NAIL INTERTROCHANTRIC (Right) Patient reports pain as mild.  Patient with no complaints.   Objective: Vital signs in last 24 hours: Temp:  [97.5 F (36.4 C)-98.6 F (37 C)] 98.6 F (37 C) (01/01 0624) Pulse Rate:  [58-105] 72 (01/01 0624) Resp:  [8-19] 18 (01/01 0624) BP: (86-125)/(39-58) 110/48 mmHg (01/01 0624) SpO2:  [93 %-100 %] 93 % (01/01 0758)  Intake/Output from previous day: 12/31 0701 - 01/01 0700 In: 1610 [P.O.:830; I.V.:900] Out: 2400 [Urine:2200; Blood:200] Intake/Output this shift:     Recent Labs  08/28/13 2235 08/29/13 0255 08/30/13 0351  HGB 11.2* 10.7* 8.7*    Recent Labs  08/29/13 0255 08/30/13 0351  WBC 8.8 8.3  RBC 3.26* 2.64*  HCT 31.6* 25.8*  PLT 205 197    Recent Labs  08/29/13 0255 08/30/13 0351  NA 141 139  K 4.2 4.2  CL 104 100  CO2 27 26  BUN 14 18  CREATININE 0.91 1.18  GLUCOSE 110* 155*  CALCIUM 8.8 8.5    Recent Labs  08/28/13 2235 08/29/13 0255  INR 1.04 1.09    Sensation intact distally Intact pulses distally Incision: dressing C/D/I Compartment soft Right foot Chronic foot drop, EHL/FHL intact  Assessment/Plan: 1 Day Post-Op Procedure(s) (LRB): INTRAMEDULLARY (IM) NAIL INTERTROCHANTRIC (Right) Up with therapy Acute blood loss anemia secondary anemia on Chronic Anemia monitor for symptoms.   Bell Buckle 08/30/2013, 8:26 AM

## 2013-08-30 NOTE — Progress Notes (Signed)
[facility] PROGRESS NOTE  [name] ACZ:[id number] DOB: 1924-10-12 DOA: 08/28/2013 PCP: Joycelyn Man, MD  Assessment/Plan: Orthostatic hypotension/syncope +?+/-Vasovagal -Cautious fluid bolus and one transfuse unit of blood  -Cycle cardiac enzymes, troponin so far negative -Patient clinically better, follow and recheck orthostatics Acute blood loss anemia, postop -Symptomatic, will transfuse a unit of packed red blood cells follow and recheck Right hip fracture-S/p Mechanical fall -Status post surgery on 12/31  Hx of MI, Cardiac arrest x 2 and chf. Appears euvolemic Echo in 6/14 showed and EF of 55% Dr. Mare Ferrari is his cardiologist Continue lasix, imdur, statin, Hold parameters added to metoprolol, Holding Plavix >> orthopedics to recommend when ok to resume , continue Aspirin 81 mg  Peripheral neuropathy Follow and resume carbamazepine as appropriate Asthma On symbicort, atrovent Stable.   DVT Prophylaxis:  Per Ortho  Code Status: full code but does not want long term life support. Family Communication: wife at bedside Disposition Plan: Pending clinical course   Consultants:  Ortho  Procedures: -Treatment of intertrochanteric fracture with intramedullary implant. CPT [phone] on 12/31 per Dr [name]    Antibiotics:  none  HPI/Subjective: Per nursing patient complained of dizziness and after this had him up on bedside commode vomited and had a syncopal episode. Shortly thereafter he began having chest pain radiating to his shoulder, was given 2 nitroglycerin and admitted to have another syncopal episodes >> blood pressure checked was 79 systolic.  -At this time he states he feels much better -chest pain subsided and he denies shortness of breath  Objective: Filed Vitals:   08/30/13 1305 08/30/13 1405 08/30/13 1505 08/30/13 1541  BP: 120/63 122/56 124/53 127/53  Pulse: 75 74 72 72  Temp: 97.8 F (36.6 C) 97.7 F (36.5 C) 97.7 F (36.5 C) 97.9 F  (36.6 C)  TempSrc: Oral Oral Oral Oral  Resp: 16 15 15 14   Height:      Weight:      SpO2: 96% 98% 99% 100%    Intake/Output Summary (Last 24 hours) at 08/30/13 1738 Last data filed at 08/30/13 1723  Gross per 24 hour  Intake  782.5 ml  Output    775 ml  Net    7.5 ml   Filed Weights   08/29/13 0428  Weight: 71.668 kg (158 lb)    Exam: Exam:  General: alert & oriented x 3 In NAD Cardiovascular: RRR, nl S1 s2 Respiratory: Decreased breath sounds at the bases, no crackles or wheezes Abdomen: soft +BS NT/ND, no masses palpable Extremities: No cyanosis and no edema        Data Reviewed: Basic Metabolic Panel:  Recent Labs Lab 08/28/13 2235 08/29/13 0255 08/30/13 0351  NA 135* 141 139  K 4.6 4.2 4.2  CL 100 104 100  CO2 26 27 26   GLUCOSE 117* 110* 155*  BUN 15 14 18   CREATININE 0.95 0.91 1.18  CALCIUM 9.5 8.8 8.5   Liver Function Tests:    CBC:  Recent Labs Lab 08/28/13 2235 08/29/13 0255 08/30/13 0351  WBC 7.3 8.8 8.3  NEUTROABS 5.3  --   --   HGB 11.2* 10.7* 8.7*  HCT 33.4* 31.6* 25.8*  MCV 96.8 96.9 97.7  PLT 226 205 197    No results found for this or any previous visit (from the past 240 hour(s)).   Studies: Dg Chest 1 View  08/28/2013   CLINICAL DATA:  Right hip pain after a fall.  EXAM: CHEST - 1 VIEW  COMPARISON:  04/03/2013  FINDINGS: Stable appearance of postoperative change in the mediastinum. Heart size and pulmonary vascularity are normal for technique. Linear atelectasis or fibrosis in the right lung base is similar to previous study. Emphysematous change in the lungs. No focal consolidation or airspace disease. Tortuous aorta. Thoracic scoliosis. Stable appearance since previous study.  IMPRESSION: Emphysematous changes in the lungs with fibrosis in the right lung base.   Electronically Signed   By: Lucienne Capers M.D.   On: 08/28/2013 22:24   Dg Hip Complete Right  08/28/2013   CLINICAL DATA:  Right hip pain after a fall.   EXAM: RIGHT HIP - COMPLETE 2+ VIEW  COMPARISON:  None.  FINDINGS: There is a comminuted intertrochanteric fracture of the right hip with mild displacement of a lesser trochanteric fragment and varus angulation of the hip. No dislocation of the hip joint. Pelvis appears intact.  IMPRESSION: Intertrochanteric fracture of the right hip.   Electronically Signed   By: Lucienne Capers M.D.   On: 08/28/2013 22:23   Dg Hip Operative Right  08/29/2013   CLINICAL DATA:  Right intra medullary femoral nail.  EXAM: DG OPERATIVE RIGHT HIP  TECHNIQUE: A single spot fluoroscopic AP image of the right hip is submitted.  COMPARISON:  08/28/2013  FINDINGS: Two intraoperative spot images demonstrate internal fixation across the right intertrochanteric fracture. Normal alignment. No hardware or bony complicating feature.  IMPRESSION: Internal fixation across the right femoral intertrochanteric fracture.   Electronically Signed   By: Rolm Baptise M.D.   On: 08/29/2013 15:11   Dg Femur Right  08/28/2013   CLINICAL DATA:  Right hip pain.  EXAM: RIGHT FEMUR - 2 VIEW  COMPARISON:  DG HIP COMPLETE*R* dated 08/28/2013  FINDINGS: Right intertrochanteric femur fracture again demonstrated. There is no evidence of fracture of the distal right femur.  IMPRESSION: No distal femur fracture. Proximal right hip fracture described on comparison film   Electronically Signed   By: Suzy Bouchard M.D.   On: 08/28/2013 23:20   Dg Femur Right Port  08/29/2013   CLINICAL DATA:  Post right femur intra medullary rod fixation  EXAM: PORTABLE RIGHT FEMUR - 2 VIEW  COMPARISON:  Right hip and femur radiographs - 08/28/2013; intraoperative radiographic images of the right hip-earlier same day  FINDINGS: Images demonstrate intra medullary rod fixation of the femur and dynamic screw fixation of the right femoral neck, now with near anatomic alignment of previously identified intertrochanteric femur fracture. There is expected subcutaneous emphysema  about the operative site.  Skin staples are seen about the upper and lateral thigh. Skin staples are seen about the medial aspect of the knee and upper calf. No radiopaque foreign body. No new fracture.  IMPRESSION: Post ORIF of intertrochanteric femur fracture without evidence of complication.   Electronically Signed   By: Sandi Mariscal M.D.   On: 08/29/2013 15:00    Scheduled Meds: . acetaminophen  650 mg Oral Q8H  . aspirin  81 mg Oral Daily  . atorvastatin  80 mg Oral QPM  . budesonide-formoterol  2 puff Inhalation BID  . carbamazepine  300 mg Oral BID  . docusate sodium  100 mg Oral BID  . enoxaparin (LOVENOX) injection  40 mg Subcutaneous Q24H  . famotidine  20 mg Oral Daily  . finasteride  5 mg Oral Daily  . furosemide  40 mg Oral q morning - 10a  . isosorbide mononitrate  90 mg Oral q1800  . metoprolol tartrate  12.5 mg Oral BID  .  potassium chloride SA  20 mEq Oral Daily  . senna  1 tablet Oral BID  . sertraline  150 mg Oral q morning - 10a   Continuous Infusions:   Active Problems:   HYPERLIPIDEMIA   HYPERTENSION   GERD   Chronic asthma   Intertrochanteric fracture of right hip   Hip fracture   COPD (chronic obstructive pulmonary disease)   H/O cardiac arrest  Time spent -61mins  Sheila Oats, MD  [facility] Pager [phone]. If 7PM-7AM, please contact night-coverage at [email or web address], password [facility] 08/30/2013, 5:38 PM  LOS: 2 days

## 2013-08-30 NOTE — Evaluation (Signed)
Physical Therapy Evaluation Patient Details Name: [name] MRN: [id number] DOB: [birth year] Today's Date: 08/30/2013 Time: 0900-0930 PT Time Calculation (min): 30 min  PT Assessment / Plan / Recommendation History of Present Illness  Pt is an 80 y/o male admitted s/p right IM nailing after a mechanical fall. Pt with history of chronic foot drop and has AFO present in room.   Clinical Impression  This patient presents with acute pain and decreased functional independence following the above mentioned procedure. This patient is appropriate for skilled PT interventions to address functional limitations, improve safety and independence with functional mobility, and return to PLOF.At the time of PT eval, pt required assist for transfers and ambulation. Pt reports feeling that he was about to faint during gait training, and a chair had to be pulled up behind him quickly for seated rest break.      PT Assessment  Patient needs continued PT services    Follow Up Recommendations  SNF    Does the patient have the potential to tolerate intense rehabilitation      Barriers to Discharge        Equipment Recommendations  Other (comment) (TBD by next venue of care)    Recommendations for Other Services     Frequency Min 3X/week    Precautions / Restrictions Precautions Precautions: Fall Restrictions Weight Bearing Restrictions: Yes RLE Weight Bearing: Weight bearing as tolerated   Pertinent Vitals/Pain Significant lightheadedness with reported improvement in symptoms after seated rest break.       Mobility  Bed Mobility Bed Mobility: Supine to Sit;Sitting - Scoot to Edge of Bed Supine to Sit: 3: Mod assist;HOB elevated;With rails Sitting - Scoot to Edge of Bed: 4: Min assist Details for Bed Mobility Assistance: Assist for movement and support of RLE, and bed pad use for transition all the way to EOB.  Transfers Transfers: Sit to Stand;Stand to Sit Sit to Stand: 4: Min  assist;From bed;With upper extremity assist Stand to Sit: 4: Min assist;With upper extremity assist;To chair/3-in-1 Details for Transfer Assistance: Assist to come to full stand, and for hand placement on armrests of chair prior to initiating stand>sit. Pt cued for hand placement for safety, however appeared anxious and afraid to let go of the walker to reach back.  Ambulation/Gait Ambulation/Gait Assistance: 4: Min assist Ambulation Distance (Feet): 5 Feet Assistive device: Rolling walker Ambulation/Gait Assistance Details: Pt cued for sequencing and safety awareness with the RW. Assist needed for walker placement and positioning.  Gait Pattern: Step-to pattern;Decreased stride length;Trunk flexed Gait velocity: Decreased General Gait Details: Pt reports feeling as if they were going to pass out and a chair was pulled up behind them quickly.  Stairs: No Wheelchair Mobility Wheelchair Mobility: No    Exercises General Exercises - Lower Extremity Ankle Circles/Pumps: 10 reps Quad Sets: 10 reps Heel Slides: 10 reps Hip ABduction/ADduction: 10 reps   PT Diagnosis: Difficulty walking;Acute pain  PT Problem List: Decreased strength;Decreased range of motion;Decreased activity tolerance;Decreased balance;Decreased mobility;Decreased knowledge of use of DME;Decreased safety awareness;Pain PT Treatment Interventions: DME instruction;Gait training;Stair training;Functional mobility training;Therapeutic activities;Therapeutic exercise;Neuromuscular re-education;Patient/family education     PT Goals(Current goals can be found in the care plan section) Acute Rehab PT Goals Patient Stated Goal: To return home PT Goal Formulation: With patient Time For Goal Achievement: 09/06/13 Potential to Achieve Goals: Good  Visit Information  Last PT Received On: 08/30/13 Assistance Needed: +2 (For safety/chair follow) History of Present Illness: Pt is an 80 y/o male admitted s/p  right IM nailing after a  mechanical fall. Pt with history of chronic foot drop and has AFO present in room.        Prior [place] expects to be discharged to:: Private residence Living Arrangements: Spouse/significant other Available Help at Discharge: Family;Available 24 hours/day Type of Home: House Home Access: Stairs to enter [employer] of Steps: 2 Entrance Stairs-Rails: Left Home Layout: One level Home Equipment: Grab bars - tub/shower;Shower seat (3 wheeled walker) Prior Function Level of Independence: Independent with assistive device(s) Comments: Uses walker all the time when out of the home and occasionally when in the home.  Communication Communication: No difficulties Dominant Hand: Right    Cognition  Cognition Arousal/Alertness: Awake/alert Behavior During Therapy: WFL for tasks assessed/performed Overall Cognitive Status: Within Functional Limits for tasks assessed    Extremity/Trunk Assessment Upper Extremity Assessment Upper Extremity Assessment: Generalized weakness Lower Extremity Assessment Lower Extremity Assessment: RLE deficits/detail RLE Deficits / Details: Decreased strength and AROM consistent with R IM intertrochanteric nailing.  RLE: Unable to fully assess due to pain Cervical / Trunk Assessment Cervical / Trunk Assessment: Kyphotic   Balance Balance Balance Assessed: Yes Static Sitting Balance Static Sitting - Balance Support: Feet supported;Bilateral upper extremity supported Static Sitting - Level of Assistance: 5: Stand by assistance Static Standing Balance Static Standing - Balance Support: Bilateral upper extremity supported Static Standing - Level of Assistance: 4: Min assist  End of Session PT - End of Session Equipment Utilized During Treatment: Gait belt Activity Tolerance: Other (comment) (Limited by dizziness, improved with seated rest) Patient left: in chair;with call bell/phone within reach;with family/visitor  present  GP     Jolyn Lent 08/30/2013, 11:00 AM  Jolyn Lent, PT, DPT [phone]

## 2013-08-30 NOTE — Progress Notes (Signed)
Pt received 1 unit PRBC. Start time 1255, end time 1540. Vital signs stable throughout with no significant changes. No reaction occurred. Patient feeling much better since nausea/vomiting and dizziness earlier. Chest pain has resolved at this time. Will continue to monitor.

## 2013-08-30 NOTE — Progress Notes (Signed)
Patient only voided 66ml in 10 hours, since foley catheter removed this AM. Bladder scan performed and 381ml of urine found. He is not complaining of discomfort at this time but is agreeable to in and out catheter as per protocol. In and out cath performed and 454ml of urine returned. Will continue to monitor for void q2.

## 2013-08-30 NOTE — Progress Notes (Signed)
Called by staff to come and see patient.  He was on commode when he went unresponsive.  Upon my arrival to patients room, RN in room and on phone with MD.  Patient still sitting on commode, awake and alert, vomiting.  zofran given by bedside RN.  VSS.  Rn to call if assistance needed

## 2013-08-30 NOTE — Progress Notes (Signed)
RN in room administering medications. BP assessed prior to meds given, 105/45. Patient needed assistance to bathroom. Patient able to stand with 2 assist pivot to commode. While on commode patient began to tense up, vomited, and went unresponsive.  Vitals assessed 110/48. Rapid response RN called along with staff assist. Patient came back to within moments, A&Ox4. MD called and ordered 1 unit of stat blood for hypotension and hgb 8.7. Patient assisted back to bed. BP stable 123/55 95% @l , HR 86.  IV Zofran administered for nausea/vomiting. After getting  in bed patient began complaining of shortness of breath with left sternal pain and radiation to the left shoulder. Pain 10/10. VS stable with BP 128/52 94% 3L and HR 81. Rapid Response called again to assess patient. EKG performed. Per RR nurse Nitro tab administered at 1039. After 5 minutes chest pain still existing pain 8/10. BP stable, second nitro administered at 1045. Shortly after 2nd nitro administered patient stated "he was going out" and began to tense up again, go unresponsive, and vomit as per previous episode. MD notified of second episode and ordered STAT troponin. RR nurse reviewed EKG, seemed to look similar to yesterdays pre-op EKG. Patient BP initially dropped due to Nitro 79/49, but RRN gave 250cc bolus. BP increased to 110/51. Patient stated chest pain now decreased to a 5 and that it usually takes 2 nitro at home to decrease his angina. MD ordered telemetry, patient placed on monitor. Patient does have a history of MI X23 in November. MD wants blood administered and will continue to monitor. Patient sitting up in bed stable upon leaving room.

## 2013-08-30 NOTE — Progress Notes (Signed)
After 2 RN checked blood and prepared to hang Unit of Blood, Spiked bag and bag began leaking on the side. NO blood given to patient. Called blood bank and instructed to bring the unit back and they will release another unit of blood for the patient. Transfusion not initiated at this time.

## 2013-08-30 NOTE — Significant Event (Signed)
Rapid Response Event Note  Overview: Time Called: 1033 Arrival Time: 1035 Event Type: Cardiac  Initial Focused Assessment:Called by RN to review EKG, Patient c/o left sided chest pain radiating up to left shoulder.     Interventions:  Reviewed EKG-similar to yesterdays EKG.   CP 8/10-Nitro SL given by bedside RN.  MD updated and on phone.  110/51 Patient still c/o CP 8/10 at 1045, another nitro SL given.  About 2 minutes after receiving the 2nd cl nitro, patient c/o not feeling well, dizzy and stated he was fading.  At this point patient became rigid and started vomiting.  BP 79/49, Hr 93, SPO2 97%.  250 cc NS bolus given.  BP increasing currently 103/45, HR 84, SPO2 92% at 1105. MD paged and notified.  CP 5/10 not radiating anymore, patient states he feels better.  Labs drawn   Event Summary:  Orders given and received.  RN to call if assistance needed   at      at          [facility], [place]

## 2013-08-30 NOTE — Progress Notes (Signed)
Patient called still complaining of pain in his chest. VS stable BP 115/61 O2 Sats 94% 2L HR 84. Maalox given to see if indigestion is cause of chest pain still being present as patient is very gassy and has had emesis twice this AM.

## 2013-08-30 NOTE — Progress Notes (Signed)
Pt BP 105/45. Discussion with patient and will hold Furosemide now and Carbamazepine at this time. He states that his medical doctor had told him not to take Carbamazepine while here. Metoprolol given at this time to keep HR WDL. Will continue to monitor BP as it was a slight issue yesterday.

## 2013-08-31 DIAGNOSIS — Z8674 Personal history of sudden cardiac arrest: Secondary | ICD-10-CM

## 2013-08-31 DIAGNOSIS — I10 Essential (primary) hypertension: Secondary | ICD-10-CM

## 2013-08-31 DIAGNOSIS — K219 Gastro-esophageal reflux disease without esophagitis: Secondary | ICD-10-CM

## 2013-08-31 LAB — BASIC METABOLIC PANEL
BUN: 18 mg/dL (ref 6–23)
CALCIUM: 8.2 mg/dL — AB (ref 8.4–10.5)
CHLORIDE: 100 meq/L (ref 96–112)
CO2: 28 mEq/L (ref 19–32)
Creatinine, Ser: 0.95 mg/dL (ref 0.50–1.35)
GFR calc Af Amer: 85 mL/min — ABNORMAL LOW (ref >90)
GFR calc non Af Amer: 73 mL/min — ABNORMAL LOW (ref >90)
Glucose, Bld: 106 mg/dL — ABNORMAL HIGH (ref 70–99)
Potassium: 4.1 mEq/L (ref 3.7–5.3)
Sodium: 136 mEq/L — ABNORMAL LOW (ref 137–147)

## 2013-08-31 LAB — CBC
HEMATOCRIT: 23.8 % — AB (ref 39.0–52.0)
Hemoglobin: 8.1 g/dL — ABNORMAL LOW (ref 13.0–17.0)
MCH: 32.4 pg (ref 26.0–34.0)
MCHC: 34 g/dL (ref 30.0–36.0)
MCV: 95.2 fL (ref 78.0–100.0)
Platelets: 170 10*3/uL (ref 150–400)
RBC: 2.5 MIL/uL — ABNORMAL LOW (ref 4.22–5.81)
RDW: 14.9 % (ref 11.5–15.5)
WBC: 8.9 10*3/uL (ref 4.0–10.5)

## 2013-08-31 LAB — HEMOGLOBIN AND HEMATOCRIT, BLOOD
HCT: 23.5 % — ABNORMAL LOW (ref 39.0–52.0)
Hemoglobin: 8.1 g/dL — ABNORMAL LOW (ref 13.0–17.0)

## 2013-08-31 LAB — TROPONIN I: Troponin I: <0.3 ng/mL (ref <0.30)

## 2013-08-31 MED ORDER — BISACODYL 5 MG PO TBEC
10.0000 mg | DELAYED_RELEASE_TABLET | Freq: Every day | ORAL | Status: DC | PRN
  Administered 2013-09-01: 10 mg via ORAL

## 2013-08-31 NOTE — Clinical Social Work Psychosocial (Signed)
Clinical Social Work Department  BRIEF PSYCHOSOCIAL ASSESSMENT  Patient:[name] Account Number: [id number]   Admit date: 08/29/13 Clinical Social Worker Rhea Pink, MSW Date/Time: 08/31/2013 2:00 PM Referred by: Physician Date Referred:  Referred for   SNF Placement   Other Referral:  Interview type: Patient and patient's wife Other interview type: PSYCHOSOCIAL DATA  Living Status: Wife Admitted from facility:  Level of care:  Primary support name: [name] Primary support relationship to patient: Wife Degree of support available:  Strong and vested  CURRENT CONCERNS  Current Concerns   Post-Acute Placement   Other Concerns:  SOCIAL WORK ASSESSMENT / PLAN  CSW met with pt re: PT recommendation for SNF.   Pt lives with his spouse  CSW explained placement process and answered questions.   Pt reports [name] as their preference    CSW completed FL2 and initiated SNF search.     Assessment/plan status: Information/Referral to [employer]  Other assessment/ plan:  Information/referral to community resources:  SNF     PATIENT'S/FAMILY'S RESPONSE TO PLAN OF CARE:  Pt  reports he is agreeable to ST SNF in order to increase strength and independence with mobility prior to returning home  Pt verbalized understanding of placement process and appreciation for CSW assist.   Rhea Pink, MSW, [place]

## 2013-08-31 NOTE — Evaluation (Signed)
Occupational Therapy Evaluation and Discharge Patient Details Name: [name] MRN: [id number] DOB: 25-Mar-1925 Today's Date: 08/31/2013 Time: 0131-4388 OT Time Calculation (min): 29 min  OT Assessment / Plan / Recommendation History of present illness Pt is an 80 y/o male admitted s/p right IM nailing after a mechanical fall. Pt with history of chronic foot drop and has AFO present in room.    Clinical Impression   This 80 yo male admitted and underwent above presents to acute OT with decreased AROM RLE, increased pain RLE, pta RLE AFO for foot drop--all affecting pt's PLOF. Will benefit from follow up OT at SNF, will defer remainder of treatment to SNF, acute OT will sign off.    OT Assessment  All further OT needs can be met in the next venue of care    Follow Up Recommendations  SNF       Equipment Recommendations  3 in 1 bedside comode          Precautions / Restrictions Precautions Precautions: Fall Restrictions RLE Weight Bearing: Weight bearing as tolerated   Pertinent Vitals/Pain 8/10 RLE with ambulation.    ADL  Eating/Feeding: Independent Where Assessed - Eating/Feeding: Chair Grooming: Set up Where Assessed - Grooming: Supported sitting Upper Body Bathing: Set up Where Assessed - Upper Body Bathing: Supported sitting Lower Body Bathing: Moderate assistance Where Assessed - Lower Body Bathing: Supported sit to stand Upper Body Dressing: Minimal assistance Where Assessed - Upper Body Dressing: Supported sitting Lower Body Dressing: +1 Total assistance Where Assessed - Lower Body Dressing: Supported sit to Lobbyist: Minimal assistance Armed forces technical officer Method: Sit to Loss adjuster, chartered:  (Bed(raised)>door>sit in recliner behind him) Toileting - Water quality scientist and Hygiene: Moderate assistance Where Assessed - Toileting Clothing Manipulation and Hygiene: Sit to stand from 3-in-1 or toilet Equipment Used: Gait belt;Rolling  walker (RLE upright lateral AFO) Transfers/Ambulation Related to ADLs: Min A for all with RW    OT Diagnosis: Generalized weakness;Acute pain  OT Problem List: Decreased strength;Decreased range of motion;Impaired balance (sitting and/or standing);Pain;Decreased knowledge of use of DME or AE    Acute Rehab OT Goals Patient Stated Goal: Home after rehab  Visit Information  Last OT Received On: 08/31/13 Assistance Needed: +1 PT/OT/SLP Co-Evaluation/Treatment: Yes Reason for Co-Treatment:  (due to medical issues yesterday) History of Present Illness: Pt is an 80 y/o male admitted s/p right IM nailing after a mechanical fall. Pt with history of chronic foot drop and has AFO present in room.        Prior [place] expects to be discharged to:: Private residence Living Arrangements: Spouse/significant other Available Help at Discharge: Family;Available 24 hours/day Type of Home: House Home Access: Stairs to enter [employer] of Steps: 2 Entrance Stairs-Rails: Left Home Layout: One level Home Equipment: Grab bars - tub/shower;Shower seat (3 wheeled walker) Prior Function Level of Independence: Independent with assistive device(s) Comments: Uses walker all the time when out of the home and occasionally when in the home.  Communication Communication: No difficulties Dominant Hand: Right         Vision/Perception Vision - History Patient Visual Report: No change from baseline   Cognition  Cognition Arousal/Alertness: Awake/alert Behavior During Therapy: WFL for tasks assessed/performed Overall Cognitive Status: Within Functional Limits for tasks assessed    Extremity/Trunk Assessment Upper Extremity Assessment Upper Extremity Assessment: Overall WFL for tasks assessed     Mobility Bed Mobility Bed Mobility: Supine to Sit;Sitting - Scoot  to Edge of Bed Supine to Sit: 4: Min assist;With rails;HOB elevated Sitting - Scoot to  Edge of Bed: 4: Min guard;With rail Transfers Transfers: Sit to Stand;Stand to Sit Sit to Stand: 4: Min assist;From bed;With upper extremity assist;From elevated surface Stand to Sit: 4: Min assist;With upper extremity assist;To chair/3-in-1           End of Session OT - End of Session Equipment Utilized During Treatment: Gait belt;Rolling walker (RLE AFO) Activity Tolerance: Patient tolerated treatment well Patient left: in chair;with call bell/phone within reach;with family/visitor present       Almon Register [phone] 08/31/2013, 12:34 PM

## 2013-08-31 NOTE — Progress Notes (Signed)
   Subjective:  Patient reports pain as mild.  Had episode of syncope yesterday.  Objective:   VITALS:   Filed Vitals:   08/31/13 0000 08/31/13 0156 08/31/13 0400 08/31/13 0506  BP:  90/45  90/48  Pulse:  68  71  Temp:  98.2 F (36.8 C)  98.1 F (36.7 C)  TempSrc:      Resp: 16 16 16 16   Height:      Weight:      SpO2:  96% 95% 95%    Neurologically intact Neurovascular intact Sensation intact distally Intact pulses distally Dorsiflexion/Plantar flexion intact Incision: dressing C/D/I and scant drainage No cellulitis present Compartment soft   Lab Results  Component Value Date   WBC 8.9 08/31/2013   HGB 8.1* 08/31/2013   HCT 23.8* 08/31/2013   MCV 95.2 08/31/2013   PLT 170 08/31/2013     Assessment/Plan: 2 Days Post-Op   Problem List Items Addressed This Visit     Cardiovascular and Mediastinum   Ischemic heart disease   Relevant Medications      isosorbide mononitrate (IMDUR) 60 MG 24 hr tablet      furosemide (LASIX) tablet 40 mg      metoprolol tartrate (LOPRESSOR) tablet 12.5 mg      aspirin chewable tablet 81 mg      atorvastatin (LIPITOR) tablet 80 mg      enoxaparin (LOVENOX) injection 40 mg      ePHEDrine injection 10 mg (Completed)      ePHEDrine 50 MG/ML injection (Completed)      nitroGLYCERIN (NITROSTAT) SL tablet 0.4 mg     Respiratory   COPD (chronic obstructive pulmonary disease)   Relevant Medications      budesonide-formoterol (SYMBICORT) 80-4.5 MCG/ACT inhaler 2 puff      ipratropium (ATROVENT) nebulizer solution 0.5 mg     Musculoskeletal and Integument   Intertrochanteric fracture of right hip - Primary   Relevant Orders      Weight bearing as tolerated     Other   Dizziness    Other Visit Diagnoses   Orthostatic hypotension          Expected acute blood loss anemia Up with PT/OT DVT ppx - SCDs, ambulation, plavix May resume plavix today and d/c lovenox WBAT left and lower extremity Pain control   Marianna Payment 08/31/2013, 8:14 AM [phone]

## 2013-08-31 NOTE — Progress Notes (Signed)
[facility] PROGRESS NOTE  [name] ION:[id number] DOB: [birth year] DOA: 08/28/2013 PCP: Joycelyn Man, MD  Assessment/Plan: Orthostatic hypotension/syncope +?+/-Vasovagal -pt not orthostatic per vitals this a.m. -troponins so far negative -Patient clinically better,   Acute blood loss anemia, postop -Symptomatic, on 1/1 and transfused one unit of packed red blood cells but no increase in hemoglobin today -Will repeat hemoglobin this p.m. and if still 8.1 or less will need to be transfused another unit of packed red blood cells  Right hip fracture-S/p Mechanical fall -Status post surgery on 12/31  Hx of MI, Cardiac arrest x 2 and chf. Appears euvolemic Echo in 6/14 showed and EF of 55% Dr. Mare Ferrari is his cardiologist Continue lasix, imdur, statin, Hold parameters added to metoprolol, Holding Plavix >> orthopedics to recommend when ok to resume , continue Aspirin 81 mg  Peripheral neuropathy Follow and resume carbamazepine as appropriate Asthma On symbicort, atrovent Stable.   DVT Prophylaxis:  Per Ortho  Code Status: full code but does not want long term life support. Family Communication: wife at bedside Disposition Plan: Pending clinical course   Consultants:  Ortho  Procedures: -Treatment of intertrochanteric fracture with intramedullary implant. CPT [id number] on 12/31 per Dr [name]    Antibiotics:  none  HPI/Subjective: Doing better this a.m., denies chest pain and no shortness of breath. No further syncopal episodes. Sitting up in chair.  Objective: Filed Vitals:   08/31/13 1141 08/31/13 1145 08/31/13 1200 08/31/13 1300  BP: 113/50 97/44  97/44  Pulse: 71 71  65  Temp:    98.2 F (36.8 C)  TempSrc:      Resp:   18 18  Height:      Weight:      SpO2: 96% 96%  98%    Intake/Output Summary (Last 24 hours) at 08/31/13 1446 Last data filed at 08/31/13 1300  Gross per 24 hour  Intake    890 ml  Output    800 ml  Net     90 ml   Filed  Weights   08/29/13 0428  Weight: 71.668 kg (158 lb)    Exam: Exam:  General: alert & oriented x 3 In NAD Cardiovascular: RRR, nl S1 s2 Respiratory: Decreased breath sounds at the bases, no crackles or wheezes Abdomen: soft +BS NT/ND, no masses palpable Extremities: No cyanosis and no edema        Data Reviewed: Basic Metabolic Panel:  Recent Labs Lab 08/28/13 2235 08/29/13 0255 08/30/13 0351 08/31/13 0400  NA 135* 141 139 136*  K 4.6 4.2 4.2 4.1  CL 100 104 100 100  CO2 26 27 26 28   GLUCOSE 117* 110* 155* 106*  BUN 15 14 18 18   CREATININE 0.95 0.91 1.18 0.95  CALCIUM 9.5 8.8 8.5 8.2*   Liver Function Tests:    CBC:  Recent Labs Lab 08/28/13 2235 08/29/13 0255 08/30/13 0351 08/31/13 0400  WBC 7.3 8.8 8.3 8.9  NEUTROABS 5.3  --   --   --   HGB 11.2* 10.7* 8.7* 8.1*  HCT 33.4* 31.6* 25.8* 23.8*  MCV 96.8 96.9 97.7 95.2  PLT 226 205 197 170    No results found for this or any previous visit (from the past 240 hour(s)).   Studies: No results found.  Scheduled Meds: . acetaminophen  650 mg Oral Q8H  . aspirin  81 mg Oral Daily  . atorvastatin  80 mg Oral QPM  . budesonide-formoterol  2 puff Inhalation BID  . carbamazepine  300 mg Oral BID  . docusate sodium  100 mg Oral BID  . enoxaparin (LOVENOX) injection  40 mg Subcutaneous Q24H  . famotidine  20 mg Oral Daily  . finasteride  5 mg Oral Daily  . furosemide  40 mg Oral q morning - 10a  . isosorbide mononitrate  90 mg Oral q1800  . metoprolol tartrate  12.5 mg Oral BID  . potassium chloride SA  20 mEq Oral Daily  . senna  1 tablet Oral BID  . sertraline  150 mg Oral q morning - 10a   Continuous Infusions:   Active Problems:   HYPERLIPIDEMIA   HYPERTENSION   GERD   Chronic asthma   Intertrochanteric fracture of right hip   Hip fracture   COPD (chronic obstructive pulmonary disease)   H/O cardiac arrest  Time spent -64mins  Sheila Oats, MD  [facility] Pager [phone].  If 7PM-7AM, please contact night-coverage at [email or web address], password [facility] [place] 08/31/2013, 2:46 PM  LOS: 3 days

## 2013-08-31 NOTE — Progress Notes (Signed)
Physical Therapy Treatment Patient Details Name: KENDRIX ORMAN MRN: [id number] DOB: 07-Aug-1925 Today's Date: 08/31/2013 Time: 8182-9937 PT Time Calculation (min): 26 min  PT Assessment / Plan / Recommendation  History of Present Illness Pt is an 80 y/o male admitted s/p right IM nailing after a mechanical fall. Pt with history of chronic foot drop and has AFO present in room.    PT Comments   Pt reports minimal dizziness throughout session and orthostatics were taken in supine, sitting, and standing (entered in the computer during session by OT). Pt able to improve gait distance but fatigues quickly. Close chair follow due to medical issues the previous day. Pt and wife agree to SNF at d/c.  Follow Up Recommendations  SNF     Does the patient have the potential to tolerate intense rehabilitation     Barriers to Discharge        Equipment Recommendations  Other (comment) (TBD by next venue of care)    Recommendations for Other Services    Frequency Min 3X/week   Progress towards PT Goals Progress towards PT goals: Progressing toward goals  Plan Current plan remains appropriate    Precautions / Restrictions Precautions Precautions: Fall Restrictions Weight Bearing Restrictions: Yes RLE Weight Bearing: Weight bearing as tolerated   Pertinent Vitals/Pain 4/10 at rest    Mobility  Bed Mobility Bed Mobility: Supine to Sit;Sitting - Scoot to Edge of Bed Supine to Sit: 4: Min assist;With rails;HOB elevated Sitting - Scoot to Edge of Bed: 4: Min guard;With rail Details for Bed Mobility Assistance: Assist for movement and support of RLE Transfers Transfers: Sit to Stand;Stand to Sit Sit to Stand: 4: Min assist;From bed;With upper extremity assist;From elevated surface Stand to Sit: 4: Min assist;With upper extremity assist;To chair/3-in-1 Details for Transfer Assistance: VC's for hand placement on seated surface for safety.  Ambulation/Gait Ambulation/Gait Assistance: 4: Min  assist Ambulation Distance (Feet): 15 Feet Assistive device: Rolling walker Ambulation/Gait Assistance Details: VC's for sequencing and safety awareness. Pt required some assist for walker placement and positioning when mobilizing around the foot of the bed.  Gait Pattern: Step-to pattern;Decreased stride length;Trunk flexed Gait velocity: Decreased General Gait Details: Pt reports minimal dizziness at end of gait training. Stairs: No Wheelchair Mobility Wheelchair Mobility: No    Exercises     PT Diagnosis:    PT Problem List:   PT Treatment Interventions:     PT Goals (current goals can now be found in the care plan section) Acute Rehab PT Goals Patient Stated Goal: Home after rehab PT Goal Formulation: With patient Time For Goal Achievement: 09/06/13 Potential to Achieve Goals: Good  Visit Information  Last PT Received On: 08/31/13 Assistance Needed: +1 Reason for Co-Treatment: For patient/therapist safety (Due to medical issues yesterday) History of Present Illness: Pt is an 80 y/o male admitted s/p right IM nailing after a mechanical fall. Pt with history of chronic foot drop and has AFO present in room.     Subjective Data  Subjective: "I feel much better today." Patient Stated Goal: Home after rehab   Cognition  Cognition Arousal/Alertness: Awake/alert Behavior During Therapy: WFL for tasks assessed/performed Overall Cognitive Status: Within Functional Limits for tasks assessed    Balance     End of Session PT - End of Session Equipment Utilized During Treatment: Gait belt Activity Tolerance: Patient limited by fatigue Patient left: in chair;with call bell/phone within reach;with family/visitor present Nurse Communication: Mobility status   GP     [place],  Mickel Baas 08/31/2013, 12:45 PM  Jolyn Lent, [place], DPT [phone]

## 2013-08-31 NOTE — Progress Notes (Signed)
Patient unable to void since in and out cath performed at 1720.  Bladder scan reveals 390cc. Patient denies discomfort, bladder is not distended.  Per protocol, in and out cath done at 0115 and 400cc clear, yellow urine obtained.  Fluids encouraged.  Continue to monitor for spontaneous void.

## 2013-08-31 NOTE — Clinical Social Work Placement (Addendum)
Clinical Social Work Department  CLINICAL SOCIAL WORK PLACEMENT NOTE  08/31/2013  Patient: [name] Account Number: [id number] Admit date: 08/29/13 Clinical Social Worker: Rhea Pink, MSW, LCSWADate/time: 08/31/2013 12:00 N  Clinical Social Work is seeking post-discharge placement for this patient at the following level of care: SKILLED NURSING (*CSW will update this form in Epic as items are completed)  08/31/2013 Patient/family provided with [place] Department of Clinical Social Work's list of facilities offering this level of care within the geographic area requested by the patient (or if unable, by the patient's family).  08/31/2013 Patient/family informed of their freedom to choose among providers that offer the needed level of care, that participate in Medicare, Medicaid or managed care program needed by the patient, have an available bed and are willing to accept the patient.  08/31/2013 Patient/family informed of [facility]' ownership interest in [facility], as well as of the fact that they are under no obligation to receive care at this facility.  PASARR submitted to EDS on  09/01/12 PASARR number received from EDS on 09/01/12 FL2 transmitted to all facilities in geographic area requested by pt/family on 08/31/2013  FL2 transmitted to all facilities within larger geographic area on  Patient informed that his/her managed care company has contracts with or will negotiate with certain facilities, including the following:  Patient/family informed of bed offers received: 09/02/12 Patient [facility] bed at [facility] Physician recommends and patient chooses bed at  Patient to be transferred to on 09/04/2013 Patient to be transferred to facility by [facility] The following physician request were entered in Epic:  Additional Comments:  Rhea Pink, MSW, [place]

## 2013-09-01 LAB — CBC
HCT: 23.2 % — ABNORMAL LOW (ref 39.0–52.0)
HEMOGLOBIN: 7.9 g/dL — AB (ref 13.0–17.0)
MCH: 32.8 pg (ref 26.0–34.0)
MCHC: 34.1 g/dL (ref 30.0–36.0)
MCV: 96.3 fL (ref 78.0–100.0)
Platelets: 176 10*3/uL (ref 150–400)
RBC: 2.41 MIL/uL — AB (ref 4.22–5.81)
RDW: 14.3 % (ref 11.5–15.5)
WBC: 7.3 10*3/uL (ref 4.0–10.5)

## 2013-09-01 LAB — TROPONIN I: Troponin I: <0.3 ng/mL (ref <0.30)

## 2013-09-01 LAB — PREPARE RBC (CROSSMATCH)

## 2013-09-01 LAB — HEMOGLOBIN AND HEMATOCRIT, BLOOD
HCT: 28.9 % — ABNORMAL LOW (ref 39.0–52.0)
Hemoglobin: 9.7 g/dL — ABNORMAL LOW (ref 13.0–17.0)

## 2013-09-01 MED ORDER — IPRATROPIUM BROMIDE HFA 17 MCG/ACT IN AERS
2.0000 | INHALATION_SPRAY | Freq: Four times a day (QID) | RESPIRATORY_TRACT | Status: DC
  Administered 2013-09-02 – 2013-09-04 (×7): 2 via RESPIRATORY_TRACT
  Filled 2013-09-01 (×2): qty 12.9

## 2013-09-01 MED ORDER — ACETAMINOPHEN 325 MG PO TABS
ORAL_TABLET | ORAL | Status: AC
  Filled 2013-09-01: qty 2

## 2013-09-01 MED ORDER — BISACODYL 5 MG PO TBEC
DELAYED_RELEASE_TABLET | ORAL | Status: AC
  Filled 2013-09-01: qty 2

## 2013-09-01 MED ORDER — CLOPIDOGREL BISULFATE 75 MG PO TABS
75.0000 mg | ORAL_TABLET | Freq: Every day | ORAL | Status: DC
  Administered 2013-09-02 – 2013-09-04 (×3): 75 mg via ORAL
  Filled 2013-09-01 (×5): qty 1

## 2013-09-01 MED ORDER — IPRATROPIUM BROMIDE HFA 17 MCG/ACT IN AERS
2.0000 | INHALATION_SPRAY | RESPIRATORY_TRACT | Status: DC
  Filled 2013-09-01: qty 12.9

## 2013-09-01 NOTE — ED Provider Notes (Signed)
  This was a shared visit with a mid-level provided (NP or PA).  Throughout the patient's course I was available for consultation/collaboration.  I saw the ECG (if appropriate), relevant labs and studies - I agree with the interpretation.  On my exam the patient was in no distress.  Patient had an unfortunate stay.  The patient's sister is to be buried tomorrow and the patient was preparing for her femoral today.  He presents after a mechanical fall with hip fracture.  He required admission for surgical repair.      Carmin Muskrat, MD 09/01/13 [phone]

## 2013-09-01 NOTE — Progress Notes (Signed)
[facility] PROGRESS NOTE  [name] DTO:[id number] DOB: 11/25/1924 DOA: 08/28/2013 PCP: Joycelyn Man, MD  Assessment/Plan: Orthostatic hypotension/syncope +?+/-Vasovagal -troponins all negative -Patient clinically better -no further syncopal episodes Acute blood loss anemia, postop -Symptomatic, on 1/1 and transfused one unit of packed red blood cells but no increase in hemoglobin today -repeat hemoglobin this am <8 ,will transfuse another unit of packed red blood cells with goal to keep > 8  Right hip fracture -S/p Mechanical fall -Status post surgery on 12/31  Hx of MI, Cardiac arrest x 2 and chf. Appears euvolemic Echo in 6/14 showed and EF of 55% Dr. Mare Ferrari is his cardiologist Continue lasix, imdur, statin, metoprolol with hold parameters Holding Plavix >> orthopedics to recommend when ok to resume , continue Aspirin 81 mg  Peripheral neuropathy Follow and resume carbamazepine as appropriate Asthma On symbicort, atrovent Stable.   DVT Prophylaxis:  Per Ortho  Code Status: full code but does not want long term life support. Family Communication: wife at bedside Disposition Plan: Pending clinical course   Consultants:  Ortho  Procedures: -Treatment of intertrochanteric fracture with intramedullary implant. CPT [phone] on 12/31 per Dr [name]    Antibiotics:  none  HPI/Subjective:  denies chest pain and no shortness of breath. No further syncopal episodes. +cough. Denies gross bleeding.  Objective: Filed Vitals:   09/01/13 0400 09/01/13 0600 09/01/13 0854 09/01/13 1120  BP:  104/52  114/46  Pulse:  70  72  Temp:  98.2 F (36.8 C)    TempSrc:  Oral    Resp: 18 18    Height:      Weight:      SpO2: 98% 97% 98%     Intake/Output Summary (Last 24 hours) at 09/01/13 1156 Last data filed at 09/01/13 0800  Gross per 24 hour  Intake   1080 ml  Output   1450 ml  Net   -370 ml   Filed Weights   08/29/13 0428  Weight: 71.668 kg (158 lb)     Exam: Exam:  General: alert & oriented x 3 In NAD Cardiovascular: RRR, nl S1 s2 Respiratory: clear no crackles or wheezes Abdomen: soft +BS NT/ND, no masses palpable Extremities: No cyanosis and no edema        Data Reviewed: Basic Metabolic Panel:  Recent Labs Lab 08/28/13 2235 08/29/13 0255 08/30/13 0351 08/31/13 0400  NA 135* 141 139 136*  K 4.6 4.2 4.2 4.1  CL 100 104 100 100  CO2 26 27 26 28   GLUCOSE 117* 110* 155* 106*  BUN 15 14 18 18   CREATININE 0.95 0.91 1.18 0.95  CALCIUM 9.5 8.8 8.5 8.2*   Liver Function Tests:    CBC:  Recent Labs Lab 08/28/13 2235 08/29/13 0255 08/30/13 0351 08/31/13 0400 08/31/13 1915 09/01/13 0620  WBC 7.3 8.8 8.3 8.9  --  7.3  NEUTROABS 5.3  --   --   --   --   --   HGB 11.2* 10.7* 8.7* 8.1* 8.1* 7.9*  HCT 33.4* 31.6* 25.8* 23.8* 23.5* 23.2*  MCV 96.8 96.9 97.7 95.2  --  96.3  PLT 226 205 197 170  --  176    No results found for this or any previous visit (from the past 240 hour(s)).   Studies: No results found.  Scheduled Meds: . acetaminophen      . acetaminophen  650 mg Oral Q8H  . aspirin  81 mg Oral Daily  . atorvastatin  80 mg Oral QPM  .  bisacodyl      . budesonide-formoterol  2 puff Inhalation BID  . carbamazepine  300 mg Oral BID  . docusate sodium  100 mg Oral BID  . enoxaparin (LOVENOX) injection  40 mg Subcutaneous Q24H  . famotidine  20 mg Oral Daily  . finasteride  5 mg Oral Daily  . furosemide  40 mg Oral q morning - 10a  . ipratropium  2 puff Inhalation Q4H  . isosorbide mononitrate  90 mg Oral q1800  . metoprolol tartrate  12.5 mg Oral BID  . potassium chloride SA  20 mEq Oral Daily  . senna  1 tablet Oral BID  . sertraline  150 mg Oral q morning - 10a   Continuous Infusions:   Active Problems:   HYPERLIPIDEMIA   HYPERTENSION   GERD   Chronic asthma   Intertrochanteric fracture of right hip   Hip fracture   COPD (chronic obstructive pulmonary disease)   H/O cardiac  arrest  Time spent -71mins  Sheila Oats, MD  [facility] Pager [phone]. If 7PM-7AM, please contact night-coverage at [email or web address], password [facility] 09/01/2013, 11:56 AM  LOS: 4 days

## 2013-09-01 NOTE — Progress Notes (Signed)
Subjective: 3 Days Post-Op Procedure(s) (LRB): INTRAMEDULLARY (IM) NAIL INTERTROCHANTRIC (Right) Patient reports pain as mild.    Objective: Vital signs in last 24 hours: Temp:  [98.1 F (36.7 C)-98.2 F (36.8 C)] 98.2 F (36.8 C) (01/03 0600) Pulse Rate:  [65-71] 70 (01/03 0600) Resp:  [16-18] 18 (01/03 0600) BP: (97-120)/(44-52) 104/52 mmHg (01/03 0600) SpO2:  [96 %-100 %] 98 % (01/03 0854)  Intake/Output from previous day: 01/02 0701 - 01/03 0700 In: 1200 [P.O.:1200] Out: 1450 [Urine:1450] Intake/Output this shift: Total I/O In: 240 [P.O.:240] Out: -    Recent Labs  08/30/13 0351 08/31/13 0400 08/31/13 1915 09/01/13 0620  HGB 8.7* 8.1* 8.1* 7.9*    Recent Labs  08/31/13 0400 08/31/13 1915 09/01/13 0620  WBC 8.9  --  7.3  RBC 2.50*  --  2.41*  HCT 23.8* 23.5* 23.2*  PLT 170  --  176    Recent Labs  08/30/13 0351 08/31/13 0400  NA 139 136*  K 4.2 4.1  CL 100 100  CO2 26 28  BUN 18 18  CREATININE 1.18 0.95  GLUCOSE 155* 106*  CALCIUM 8.5 8.2*   No results found for this basename: LABPT, INR,  in the last 72 hours  Neurologically intact  Assessment/Plan: 3 Days Post-Op Procedure(s) (LRB): INTRAMEDULLARY (IM) NAIL INTERTROCHANTRIC (Right) Up with therapy   Slowed by fainting  When he got up.   [name] C 09/01/2013, 10:48 AM

## 2013-09-02 DIAGNOSIS — I5021 Acute systolic (congestive) heart failure: Secondary | ICD-10-CM

## 2013-09-02 DIAGNOSIS — I509 Heart failure, unspecified: Secondary | ICD-10-CM

## 2013-09-02 DIAGNOSIS — D62 Acute posthemorrhagic anemia: Secondary | ICD-10-CM

## 2013-09-02 LAB — BASIC METABOLIC PANEL
BUN: 15 mg/dL (ref 6–23)
CALCIUM: 8.7 mg/dL (ref 8.4–10.5)
CO2: 28 mEq/L (ref 19–32)
CREATININE: 0.89 mg/dL (ref 0.50–1.35)
Chloride: 102 mEq/L (ref 96–112)
GFR calc Af Amer: 87 mL/min — ABNORMAL LOW (ref >90)
GFR, EST NON AFRICAN AMERICAN: 75 mL/min — AB (ref >90)
GLUCOSE: 97 mg/dL (ref 70–99)
Potassium: 4.3 mEq/L (ref 3.7–5.3)
Sodium: 139 mEq/L (ref 137–147)

## 2013-09-02 LAB — TYPE AND SCREEN
ABO/RH(D): AB POS
Antibody Screen: NEGATIVE
UNIT DIVISION: 0
Unit division: 0
Unit division: 0

## 2013-09-02 LAB — CBC
HEMATOCRIT: 28 % — AB (ref 39.0–52.0)
Hemoglobin: 9.4 g/dL — ABNORMAL LOW (ref 13.0–17.0)
MCH: 31.8 pg (ref 26.0–34.0)
MCHC: 33.6 g/dL (ref 30.0–36.0)
MCV: 94.6 fL (ref 78.0–100.0)
Platelets: 198 10*3/uL (ref 150–400)
RBC: 2.96 MIL/uL — ABNORMAL LOW (ref 4.22–5.81)
RDW: 15.6 % — AB (ref 11.5–15.5)
WBC: 6.6 10*3/uL (ref 4.0–10.5)

## 2013-09-02 NOTE — Progress Notes (Signed)
[facility] PROGRESS NOTE  [name] OFB:[id number] DOB: 1925-07-26 DOA: 08/28/2013 PCP: Joycelyn Man, MD  Assessment/Plan: Orthostatic hypotension/syncope +?+/-Vasovagal -troponins all negative -Patient clinically better -no further syncopal episodes Acute blood loss anemia, postop -hgb remains stable post PRBC transfusion - hgb 9.4  Right hip fracture -S/p Mechanical fall -Status post surgery on 12/31  Hx of MI, Cardiac arrest x 2 and chf. Appears euvolemic Echo in 6/14 showed and EF of 55% Dr. Mare Ferrari is his cardiologist Continue lasix, imdur, statin, metoprolol with hold parameters Holding Plavix >> orthopedics to recommend when ok to resume , continue Aspirin 81 mg  Peripheral neuropathy Follow and resume carbamazepine as appropriate Asthma On symbicort, atrovent Stable.  DVT Prophylaxis:  Per Ortho  Code Status: full code but does not want long term life support. Family Communication: wife at bedside Disposition Plan: Pending SNF  Consultants:  Ortho  Procedures: -Treatment of intertrochanteric fracture with intramedullary implant. CPT 27245 on 12/31 per Dr [name]   Antibiotics:  none  HPI/Subjective: No acute events noted overnight  Objective: Filed Vitals:   09/02/13 0548 09/02/13 0551 09/02/13 0554 09/02/13 0938  BP: 110/55 129/57 119/48   Pulse: 71 72 70   Temp: 99 F (37.2 C)     TempSrc:      Resp: 18     Height:      Weight:      SpO2: 97%  95% 100%    Intake/Output Summary (Last 24 hours) at 09/02/13 0949 Last data filed at 09/01/13 2014  Gross per 24 hour  Intake    935 ml  Output    650 ml  Net    285 ml   Filed Weights   08/29/13 0428  Weight: 71.668 kg (158 lb)    Exam: Exam:  General: alert & oriented x 3 In NAD Cardiovascular: RRR, nl S1 s2 Respiratory: clear no crackles or wheezes Abdomen: soft +BS NT/ND, no masses palpable Extremities: No cyanosis and no edema        Data Reviewed: Basic  Metabolic Panel:  Recent Labs Lab 08/28/13 2235 08/29/13 0255 08/30/13 0351 08/31/13 0400 09/02/13 0515  NA 135* 141 139 136* 139  K 4.6 4.2 4.2 4.1 4.3  CL 100 104 100 100 102  CO2 26 27 26 28 28   GLUCOSE 117* 110* 155* 106* 97  BUN 15 14 18 18 15   CREATININE 0.95 0.91 1.18 0.95 0.89  CALCIUM 9.5 8.8 8.5 8.2* 8.7   Liver Function Tests:    CBC:  Recent Labs Lab 08/28/13 2235 08/29/13 0255 08/30/13 0351 08/31/13 0400 08/31/13 1915 09/01/13 0620 09/01/13 2000 09/02/13 0515  WBC 7.3 8.8 8.3 8.9  --  7.3  --  6.6  NEUTROABS 5.3  --   --   --   --   --   --   --   HGB 11.2* 10.7* 8.7* 8.1* 8.1* 7.9* 9.7* 9.4*  HCT 33.4* 31.6* 25.8* 23.8* 23.5* 23.2* 28.9* 28.0*  MCV 96.8 96.9 97.7 95.2  --  96.3  --  94.6  PLT 226 205 197 170  --  176  --  198    No results found for this or any previous visit (from the past 240 hour(s)).   Studies: No results found.  Scheduled Meds: . acetaminophen  650 mg Oral Q8H  . aspirin  81 mg Oral Daily  . atorvastatin  80 mg Oral QPM  . budesonide-formoterol  2 puff Inhalation BID  . carbamazepine  300  mg Oral BID  . clopidogrel  75 mg Oral Q breakfast  . docusate sodium  100 mg Oral BID  . famotidine  20 mg Oral Daily  . finasteride  5 mg Oral Daily  . furosemide  40 mg Oral q morning - 10a  . ipratropium  2 puff Inhalation QID  . isosorbide mononitrate  90 mg Oral q1800  . metoprolol tartrate  12.5 mg Oral BID  . potassium chloride SA  20 mEq Oral Daily  . senna  1 tablet Oral BID  . sertraline  150 mg Oral q morning - 10a   Continuous Infusions:   Active Problems:   HYPERLIPIDEMIA   HYPERTENSION   GERD   Chronic asthma   Intertrochanteric fracture of right hip   Hip fracture   COPD (chronic obstructive pulmonary disease)   H/O cardiac arrest  Time spent -32mins  Zamoria Boss, Orpah Melter, MD  [facility] Pager [phone]. If 7PM-7AM, please contact night-coverage at [email or web address], password [facility] 09/02/2013, 9:49 AM   LOS: 5 days

## 2013-09-02 NOTE — Progress Notes (Signed)
Clinical Education officer, museum (CSW) gave patient bed offers. Patient reported that he would like to go to [facility]. [facility] has made a bed offer. Weekday CSW will follow up.   Blima Rich, [place] Weekend CSW [phone]

## 2013-09-02 NOTE — Progress Notes (Addendum)
Patient ID: [name], male   DOB: February 12, 1925, 80 y.o.   MRN: [id number] Subjective: 4 Days Post-Op Procedure(s) (LRB): INTRAMEDULLARY (IM) NAIL INTERTROCHANTRIC (Right) Awake, alert and oriented x 4. He has a baseline right (ipsilateral) foot drop. Patient reports pain as moderate.    Objective:   VITALS:  Temp:  [97.7 F (36.5 C)-99 F (37.2 C)] 99 F (37.2 C) (01/04 0548) Pulse Rate:  [67-72] 70 (01/04 0554) Resp:  [18] 18 (01/04 0548) BP: (103-129)/(45-57) 119/48 mmHg (01/04 0554) SpO2:  [95 %-99 %] 95 % (01/04 0554)  ABD soft Intact pulses distally Dorsiflexion/Plantar flexion intact Incision: no drainage   LABS  Recent Labs  08/31/13 0400 08/31/13 1915 09/01/13 0620 09/01/13 2000 09/02/13 0515  HGB 8.1* 8.1* 7.9* 9.7* 9.4*  WBC 8.9  --  7.3  --  6.6  PLT 170  --  176  --  198    Recent Labs  08/31/13 0400 09/02/13 0515  NA 136* 139  K 4.1 4.3  CL 100 102  CO2 28 28  BUN 18 15  CREATININE 0.95 0.89  GLUCOSE 106* 97   No results found for this basename: LABPT, INR,  in the last 72 hours   Assessment/Plan: 4 Days Post-Op Procedure(s) (LRB): INTRAMEDULLARY (IM) NAIL INTERTROCHANTRIC (Right)  Advance diet Up with therapy Discharge to SNF Foley is still in place, may need urology eval for urinary retention.   Macdonald Rigor E 09/02/2013, 8:54 AM

## 2013-09-03 ENCOUNTER — Encounter (HOSPITAL_COMMUNITY): Payer: Self-pay | Admitting: Orthopaedic Surgery

## 2013-09-03 DIAGNOSIS — J9610 Chronic respiratory failure, unspecified whether with hypoxia or hypercapnia: Secondary | ICD-10-CM

## 2013-09-03 LAB — CBC
HEMATOCRIT: 25.7 % — AB (ref 39.0–52.0)
Hemoglobin: 8.6 g/dL — ABNORMAL LOW (ref 13.0–17.0)
MCH: 31.7 pg (ref 26.0–34.0)
MCHC: 33.5 g/dL (ref 30.0–36.0)
MCV: 94.8 fL (ref 78.0–100.0)
Platelets: 214 10*3/uL (ref 150–400)
RBC: 2.71 MIL/uL — ABNORMAL LOW (ref 4.22–5.81)
RDW: 15.2 % (ref 11.5–15.5)
WBC: 6.3 10*3/uL (ref 4.0–10.5)

## 2013-09-03 LAB — URINE MICROSCOPIC-ADD ON

## 2013-09-03 LAB — URINALYSIS, ROUTINE W REFLEX MICROSCOPIC
Bilirubin Urine: NEGATIVE
Glucose, UA: NEGATIVE mg/dL
Ketones, ur: NEGATIVE mg/dL
Nitrite: NEGATIVE
Protein, ur: NEGATIVE mg/dL
SPECIFIC GRAVITY, URINE: 1.016 (ref 1.005–1.030)
Urobilinogen, UA: 0.2 mg/dL (ref 0.0–1.0)
pH: 6 (ref 5.0–8.0)

## 2013-09-03 MED ORDER — TEMAZEPAM 15 MG PO CAPS: 15.0000 mg | ORAL_CAPSULE | Freq: Every evening | ORAL | Status: DC | PRN

## 2013-09-03 MED ORDER — METHOCARBAMOL 500 MG PO TABS: 500.0000 mg | ORAL_TABLET | Freq: Four times a day (QID) | ORAL | Status: DC | PRN

## 2013-09-03 MED ORDER — DSS 100 MG PO CAPS: 100.0000 mg | ORAL_CAPSULE | Freq: Two times a day (BID) | ORAL | Status: DC

## 2013-09-03 MED ORDER — SENNA 8.6 MG PO TABS: 1.0000 | ORAL_TABLET | Freq: Two times a day (BID) | ORAL | Status: DC

## 2013-09-03 MED ORDER — POLYETHYLENE GLYCOL 3350 17 G PO PACK: 17.0000 g | PACK | Freq: Every day | ORAL | Status: DC | PRN

## 2013-09-03 NOTE — Discharge Summary (Signed)
Physician Discharge Summary  Patient ID: STOY FENN MRN: [id number] DOB/AGE: 1925/02/26 80 y.o.  Admit date: 08/28/2013 Discharge date: 09/03/2013  Primary Care Physician:  Joycelyn Man, MD  Discharge Diagnoses:    . Intertrochanteric fracture of right hip . Chronic asthma . COPD (chronic obstructive pulmonary disease) . GERD . HYPERLIPIDEMIA . HYPERTENSION Urinary retention  Consults: Dr. Louanne Skye, orthopedics  Recommendations for Outpatient Follow-up:  Up with physical therapy, Continue Foley catheter, voiding trial in urology office, Dr. [name],  on 09/11/2013  Allergies:  No Known Allergies   Discharge Medications:   Medication List         acetaminophen 500 MG tablet  Commonly known as:  TYLENOL  - Take 500 mg by mouth 2 (two) times daily. Once in the am  - Once in the pm     aspirin 81 MG tablet  Take 81 mg by mouth daily.     atorvastatin 80 MG tablet  Commonly known as:  LIPITOR  Take 80 mg by mouth every evening.     budesonide-formoterol 80-4.5 MCG/ACT inhaler  Commonly known as:  SYMBICORT  Inhale 2 puffs into the lungs 2 (two) times daily.     carbamazepine 300 MG 12 hr capsule  Commonly known as:  CARBATROL  Take 300 mg by mouth 2 (two) times daily.     clopidogrel 75 MG tablet  Commonly known as:  PLAVIX  Take 75 mg by mouth daily.     DSS 100 MG Caps  Take 100 mg by mouth 2 (two) times daily.     finasteride 5 MG tablet  Commonly known as:  PROSCAR  Take 5 mg by mouth daily.     furosemide 40 MG tablet  Commonly known as:  LASIX  Take 40 mg by mouth every morning.     ipratropium 17 MCG/ACT inhaler  Commonly known as:  ATROVENT HFA  Inhale 2 puffs into the lungs 2 (two) times daily as needed for wheezing.     isosorbide mononitrate 60 MG 24 hr tablet  Commonly known as:  IMDUR  Take 90 mg by mouth daily.     methocarbamol 500 MG tablet  Commonly known as:  ROBAXIN  Take 1 tablet (500 mg total) by mouth every 6 (six)  hours as needed for muscle spasms.     metoprolol succinate 25 MG 24 hr tablet  Commonly known as:  TOPROL-XL  Take 25 mg by mouth every morning.     NITROSTAT 0.4 MG SL tablet  Generic drug:  nitroGLYCERIN  PLACE 1 TABLET UNDER THE TONGUE EVERY 5 MINUTES ASDIRECTED BY PHYSICIAN     oxyCODONE 5 MG immediate release tablet  Commonly known as:  Oxy IR/ROXICODONE  Take 1-3 tablets (5-15 mg total) by mouth every 4 (four) hours as needed.     polyethylene glycol packet  Commonly known as:  MIRALAX / GLYCOLAX  Take 17 g by mouth daily as needed for mild constipation.     potassium chloride SA 20 MEQ tablet  Commonly known as:  K-DUR,KLOR-CON  Take 20 mEq by mouth daily.     ranitidine 150 MG tablet  Commonly known as:  ZANTAC  Take 1 tablet (150 mg total) by mouth 2 (two) times daily.     senna 8.6 MG Tabs tablet  Commonly known as:  SENOKOT  Take 1 tablet (8.6 mg total) by mouth 2 (two) times daily.     sertraline 100 MG tablet  Commonly known as:  ZOLOFT  Take 150 mg by mouth every morning.     temazepam 15 MG capsule  Commonly known as:  RESTORIL  Take 1 capsule (15 mg total) by mouth at bedtime as needed for sleep.         Brief H and P: For complete details please refer to admission H and P, but in briefTommy A Lall is a 80 y.o. male with extensive coronary disease and ischemic cardiomyopathy s/p CABG 1986 and redo 1993. He experienced cardiac arrest in Nov 2013 which was ischemic in origin requiring PCI. Chronically on ASA/Plavix. Does use oxygen 2L at home presumably for HF (he denies a diagnosis of OSA).  Patient presented after a fall that experienced as he was transferring out of a vehicle. He was attempting to use his walker when he met a discontinuity in the pavement, leading his right hip to cross over his left and landing on his right hip. He attempted to have a neighbor help him up but discovered that he could not do so due to pain. He then was transported to  the ED.  In general the patient does state that his functional status is fair; he is able to ambulate for 30 mins at a slow walk on level ground. He does experience angina which he describes as an epigastric/chest "indigestion" feeling when he tries to walk up an incline   Hospital Course:  Right hip fracture after a mechanical fall: Orthopedics was consulted and patient underwent intramedullary intertrochanteric nailing on 08/29/2013. Patient has been working with physical therapy, currently recommended skilled nursing facility for rehabilitation  Orthostatic hypotension/syncope +?+/-Vasovagal : On 08/30/2013, patient was on commode when he went unresponsive. Patient had complained of chest pain, EKG was similar to the previous EKGs, troponins were negative. Patient had no further syncopal episodes since then.  Acute blood loss anemia, postop -hgb remains stable post PRBC transfusion   History  of MI, Cardiac arrest x 2 and chf.  Appears euvolemic, Echo in 6/14 showed and EF of 55%  Continue lasix, imdur, statin, metoprolol with hold parameters  Plavix has been restarted, continue aspirin, follow with Dr. Mare Ferrari outpatient   Peripheral neuropathy  Follow and resume carbamazepine as appropriate   Asthma  On symbicort, atrovent  Stable.  Urine retention - Postoperatively, UA was negative for UTI. Patient had Foley catheter placed for the urine retention. I made an appointment with Dr. [name] on 09/13/2013 for voiding trial in the office.  Day of Discharge BP 107/46  Pulse 64  Temp(Src) 98.1 F (36.7 C) (Oral)  Resp 18  Ht 5' 10"  (1.778 m)  Wt 71.668 kg (158 lb)  BMI 22.67 kg/m2  SpO2 98%  Physical Exam: General: Alert and awake oriented x3 not in any acute distress. CVS: S1-S2 clear no murmur rubs or gallops Chest: clear to auscultation bilaterally, no wheezing rales or rhonchi Abdomen: soft nontender, nondistended, normal bowel sounds Extremities: no cyanosis, clubbing  or edema noted bilaterally    The results of significant diagnostics from this hospitalization (including imaging, microbiology, ancillary and laboratory) are listed below for reference.    LAB RESULTS: Basic Metabolic Panel:  Recent Labs Lab 08/31/13 0400 09/02/13 0515  NA 136* 139  K 4.1 4.3  CL 100 102  CO2 28 28  GLUCOSE 106* 97  BUN 18 15  CREATININE 0.95 0.89  CALCIUM 8.2* 8.7   Liver Function Tests: No results found for this basename: AST, ALT, ALKPHOS, BILITOT, PROT, ALBUMIN,  in the last 168 hours  No results found for this basename: LIPASE, AMYLASE,  in the last 168 hours No results found for this basename: AMMONIA,  in the last 168 hours CBC:  Recent Labs Lab 08/28/13 2235  09/02/13 0515 09/03/13 0515  WBC 7.3  < > 6.6 6.3  NEUTROABS 5.3  --   --   --   HGB 11.2*  < > 9.4* 8.6*  HCT 33.4*  < > 28.0* 25.7*  MCV 96.8  < > 94.6 94.8  PLT 226  < > 198 214  < > = values in this interval not displayed. Cardiac Enzymes:  Recent Labs Lab 08/31/13 2009 09/01/13 0140  TROPONINI <0.30 <0.30   BNP: No components found with this basename: POCBNP,  CBG: No results found for this basename: GLUCAP,  in the last 168 hours  Significant Diagnostic Studies:  Dg Chest 1 View  08/28/2013   CLINICAL DATA:  Right hip pain after a fall.  EXAM: CHEST - 1 VIEW  COMPARISON:  04/03/2013  FINDINGS: Stable appearance of postoperative change in the mediastinum. Heart size and pulmonary vascularity are normal for technique. Linear atelectasis or fibrosis in the right lung base is similar to previous study. Emphysematous change in the lungs. No focal consolidation or airspace disease. Tortuous aorta. Thoracic scoliosis. Stable appearance since previous study.  IMPRESSION: Emphysematous changes in the lungs with fibrosis in the right lung base.   Electronically Signed   By: Lucienne Capers M.D.   On: 08/28/2013 22:24   Dg Hip Complete Right  08/28/2013   CLINICAL DATA:  Right hip  pain after a fall.  EXAM: RIGHT HIP - COMPLETE 2+ VIEW  COMPARISON:  None.  FINDINGS: There is a comminuted intertrochanteric fracture of the right hip with mild displacement of a lesser trochanteric fragment and varus angulation of the hip. No dislocation of the hip joint. Pelvis appears intact.  IMPRESSION: Intertrochanteric fracture of the right hip.   Electronically Signed   By: Lucienne Capers M.D.   On: 08/28/2013 22:23   Dg Hip Operative Right  08/29/2013   CLINICAL DATA:  Right intra medullary femoral nail.  EXAM: DG OPERATIVE RIGHT HIP  TECHNIQUE: A single spot fluoroscopic AP image of the right hip is submitted.  COMPARISON:  08/28/2013  FINDINGS: Two intraoperative spot images demonstrate internal fixation across the right intertrochanteric fracture. Normal alignment. No hardware or bony complicating feature.  IMPRESSION: Internal fixation across the right femoral intertrochanteric fracture.   Electronically Signed   By: Rolm Baptise M.D.   On: 08/29/2013 15:11   Dg Femur Right  08/28/2013   CLINICAL DATA:  Right hip pain.  EXAM: RIGHT FEMUR - 2 VIEW  COMPARISON:  DG HIP COMPLETE*R* dated 08/28/2013  FINDINGS: Right intertrochanteric femur fracture again demonstrated. There is no evidence of fracture of the distal right femur.  IMPRESSION: No distal femur fracture. Proximal right hip fracture described on comparison film   Electronically Signed   By: Suzy Bouchard M.D.   On: 08/28/2013 23:20   Dg Femur Right Port  08/29/2013   CLINICAL DATA:  Post right femur intra medullary rod fixation  EXAM: PORTABLE RIGHT FEMUR - 2 VIEW  COMPARISON:  Right hip and femur radiographs - 08/28/2013; intraoperative radiographic images of the right hip-earlier same day  FINDINGS: Images demonstrate intra medullary rod fixation of the femur and dynamic screw fixation of the right femoral neck, now with near anatomic alignment of previously identified intertrochanteric femur fracture. There is expected  subcutaneous emphysema about  the operative site.  Skin staples are seen about the upper and lateral thigh. Skin staples are seen about the medial aspect of the knee and upper calf. No radiopaque foreign body. No new fracture.  IMPRESSION: Post ORIF of intertrochanteric femur fracture without evidence of complication.   Electronically Signed   By: Sandi Mariscal M.D.   On: 08/29/2013 15:00     Disposition and Follow-up:     Discharge Orders   Future Appointments Provider Department Dept Phone   10/31/2013 8:10 AM [facility] [place] [facility] [phone]   11/07/2013 8:30 AM Darlin Coco, MD [facility] [phone]   Future Orders Complete By Expires   Diet - low sodium heart healthy  As directed    Increase activity slowly  As directed    Weight bearing as tolerated  As directed    Questions:     Laterality:     Extremity:         DISPOSITION: Skilled nursing facility DIET: Regular diet  DISCHARGE FOLLOW-UP Follow-up Information   Follow up with Marianna Payment, MD In 2 weeks.   Specialty:  Orthopedic Surgery   Contact information:   [place]  [phone]       Follow up with Fredricka Bonine, MD On 09/12/2013. (at 11:15 AM for voiding trial )    Specialty:  Urology   Contact information:   [facility]  [place] [address] [phone]       Time spent on Discharge: 40 mins  Signed:   RAI,RIPUDEEP M.D. [facility] 09/03/2013, 12:21 PM Pager: [phone]

## 2013-09-03 NOTE — Progress Notes (Signed)
Physical Therapy Treatment Patient Details Name: [name] MRN: [id number] DOB: Jul 03, 1925 Today's Date: 09/03/2013 Time: 7782-4235 PT Time Calculation (min): 26 min  PT Assessment / Plan / Recommendation  History of Present Illness Pt is an 80 y/o male admitted s/p right IM nailing after a mechanical fall. Pt with history of chronic foot drop and has AFO present in room.    PT Comments   Pt progressing well towards physical therapy goals. Continues to quickly fatigue with ambulation, however improvement seen with distance and ability to perform therapeutic exercise. Pt and wife anticipate d/c to SNF today.   Follow Up Recommendations  SNF     Does the patient have the potential to tolerate intense rehabilitation     Barriers to Discharge        Equipment Recommendations  Other (comment) (TBD by next venue of care)    Recommendations for Other Services    Frequency Min 3X/week   Progress towards PT Goals Progress towards PT goals: Progressing toward goals  Plan Current plan remains appropriate    Precautions / Restrictions Precautions Precautions: Fall Restrictions Weight Bearing Restrictions: Yes RLE Weight Bearing: Weight bearing as tolerated   Pertinent Vitals/Pain 4/10 at rest, increases with activity. Pt repositioned for comfort at end of session.     Mobility  Bed Mobility Bed Mobility: Not assessed Details for Bed Mobility Assistance: Pt received up in chair  Transfers Transfers: Sit to Stand;Stand to Sit Sit to Stand: 4: Min guard;From chair/3-in-1;With upper extremity assist Stand to Sit: 4: Min guard;To chair/3-in-1;With upper extremity assist Details for Transfer Assistance: Pt demonstrated proper hand placement and safety awareness.  Ambulation/Gait Ambulation/Gait Assistance: 4: Min guard Ambulation Distance (Feet): 40 Feet Assistive device: Rolling walker Ambulation/Gait Assistance Details: VC's for sequencing and increased heel strike and quad  activation. AFO not donned, as pt reports he ambulates without it at home often.  Gait Pattern: Step-to pattern;Decreased stride length;Trunk flexed;Right flexed knee in stance Gait velocity: Decreased General Gait Details: Pt reports minimal dizziness at end of gait training. Stairs: No    Exercises General Exercises - Lower Extremity Ankle Circles/Pumps: 10 reps Quad Sets: 10 reps Long Arc Quad: 10 reps Heel Slides: 10 reps Hip ABduction/ADduction: 10 reps   PT Diagnosis:    PT Problem List:   PT Treatment Interventions:     PT Goals (current goals can now be found in the care plan section) Acute Rehab PT Goals Patient Stated Goal: Home after rehab PT Goal Formulation: With patient Time For Goal Achievement: 09/06/13 Potential to Achieve Goals: Good  Visit Information  Last PT Received On: 09/03/13 Assistance Needed: +1 History of Present Illness: Pt is an 80 y/o male admitted s/p right IM nailing after a mechanical fall. Pt with history of chronic foot drop and has AFO present in room.     Subjective Data  Subjective: "I think I'm doing better today." Patient Stated Goal: Home after rehab   Cognition  Cognition Arousal/Alertness: Awake/alert Behavior During Therapy: WFL for tasks assessed/performed Overall Cognitive Status: Within Functional Limits for tasks assessed    Balance  Balance Balance Assessed: Yes Static Sitting Balance Static Sitting - Balance Support: Feet supported;Bilateral upper extremity supported Static Sitting - Level of Assistance: 5: Stand by assistance Static Standing Balance Static Standing - Balance Support: Bilateral upper extremity supported Static Standing - Level of Assistance: 5: Stand by assistance  End of Session PT - End of Session Equipment Utilized During Treatment: Gait belt;Oxygen Activity Tolerance: Patient  limited by fatigue Patient left: in chair;with call bell/phone within reach;with family/visitor present Nurse  Communication: Mobility status   GP     Jolyn Lent 09/03/2013, 12:16 PM  Jolyn Lent, [place], DPT [phone]

## 2013-09-03 NOTE — Care Management Note (Signed)
CARE MANAGEMENT NOTE 09/03/2013  Patient:  [name], [name]   Account Number:  [id number]  Date Initiated:  08/29/2013  Documentation initiated by:  Ricki Miller  Subjective/Objective Assessment:   80 yr old male admitted for right hip fx.  08/29/13 pateint had right hip IM Nailing.     Action/Plan:   Patient is for shortterm rehab at [facility]. Social Worker is aware that patient is requesting [employer].   Anticipated DC Date:  09/03/2013   Anticipated DC Plan:  SKILLED NURSING FACILITY  In-house referral  Clinical Social Worker      DC Planning Services  CM consult      Choice offered to / List presented to:             Status of service:  Completed, signed off Medicare Important Message given?   (If response is "NO", the following Medicare IM given date fields will be blank) Date Medicare IM given:   Date Additional Medicare IM given:    Discharge Disposition:  [facility]

## 2013-09-04 ENCOUNTER — Other Ambulatory Visit: Payer: Self-pay | Admitting: *Deleted

## 2013-09-04 DIAGNOSIS — I2510 Atherosclerotic heart disease of native coronary artery without angina pectoris: Secondary | ICD-10-CM

## 2013-09-04 DIAGNOSIS — G4700 Insomnia, unspecified: Secondary | ICD-10-CM

## 2013-09-04 MED ORDER — TEMAZEPAM 15 MG PO CAPS: ORAL_CAPSULE | ORAL | Status: DC

## 2013-09-04 MED ORDER — OXYCODONE HCL 5 MG PO TABS: 5.0000 mg | ORAL_TABLET | ORAL | Status: DC | PRN

## 2013-09-04 MED ORDER — FERROUS GLUCONATE 324 (38 FE) MG PO TABS
324.0000 mg | ORAL_TABLET | Freq: Every day | ORAL | Status: DC
  Administered 2013-09-04: 324 mg via ORAL
  Filled 2013-09-04 (×2): qty 1

## 2013-09-04 MED ORDER — FERROUS GLUCONATE 324 (38 FE) MG PO TABS: 324.0000 mg | ORAL_TABLET | Freq: Every day | ORAL | Status: DC

## 2013-09-04 NOTE — Progress Notes (Deleted)
Clinical social worker assisted with patient discharge to skilled nursing facility, [facility].  CSW addressed all family questions and concerns. CSW copied chart and added all important documents. CSW also set up patient transportation with [facility]. Clinical Social Worker will sign off for now as social work intervention is no longer needed.   [name], MSW, LCSWA [phone] 

## 2013-09-04 NOTE — Progress Notes (Signed)
Physical Therapy Treatment Patient Details Name: MILAM ALLBAUGH MRN: [id number] DOB: Jul 26, 1925 Today's Date: 09/04/2013 Time: 1136-1201 PT Time Calculation (min): 25 min  PT Assessment / Plan / Recommendation  History of Present Illness Pt is an 80 y/o male admitted s/p right IM nailing after a mechanical fall. Pt with history of chronic foot drop and has AFO present in room.    PT Comments   Pt progressing towards physical therapy goals. Pt and wife had many questions about process at SNF, how long he will be there, and what kind of goals he will be working towards when there. Answered all questions to the fullest extent of my knowledge, and encouraged them to ask questions to therapy when arrive to SNF.   Follow Up Recommendations  SNF     Does the patient have the potential to tolerate intense rehabilitation     Barriers to Discharge        Equipment Recommendations  Other (comment) (TBD by nexy venue of care)    Recommendations for Other Services    Frequency Min 3X/week   Progress towards PT Goals Progress towards PT goals: Progressing toward goals  Plan Current plan remains appropriate    Precautions / Restrictions Precautions Precautions: Fall Restrictions Weight Bearing Restrictions: Yes RLE Weight Bearing: Weight bearing as tolerated   Pertinent Vitals/Pain Pt reports minimal pain throughout session.     Mobility  Bed Mobility Bed Mobility: Not assessed Details for Bed Mobility Assistance: Pt received up in chair  Transfers Transfers: Sit to Stand;Stand to Sit Sit to Stand: 4: Min guard;From chair/3-in-1;With upper extremity assist Stand to Sit: 4: Min guard;To chair/3-in-1;With upper extremity assist Details for Transfer Assistance: Pt demonstrated proper hand placement and safety, however was somewhat unsteady when coming to full stand. No physical assist required, however close min guard.  Ambulation/Gait Ambulation/Gait Assistance: 4: Min guard Ambulation  Distance (Feet): 50 Feet Assistive device: Rolling walker Ambulation/Gait Assistance Details: VC's for sequencing and technique. Encouraged step-through gait pattern.  Gait Pattern: Step-to pattern;Decreased stride length;Trunk flexed Gait velocity: Decreased Stairs: No    Exercises General Exercises - Lower Extremity Long Arc Quad: 10 reps Hip ABduction/ADduction: 10 reps   PT Diagnosis:    PT Problem List:   PT Treatment Interventions:     PT Goals (current goals can now be found in the care plan section) Acute Rehab PT Goals Patient Stated Goal: Home after rehab PT Goal Formulation: With patient Time For Goal Achievement: 09/06/13 Potential to Achieve Goals: Good  Visit Information  Last PT Received On: 09/04/13 Assistance Needed: +1 History of Present Illness: Pt is an 80 y/o male admitted s/p right IM nailing after a mechanical fall. Pt with history of chronic foot drop and has AFO present in room.     Subjective Data  Subjective: "I hope I can leave today to go to rehab." Patient Stated Goal: Home after rehab   Cognition  Cognition Arousal/Alertness: Awake/alert Behavior During Therapy: WFL for tasks assessed/performed Overall Cognitive Status: Within Functional Limits for tasks assessed    Balance     End of Session PT - End of Session Equipment Utilized During Treatment: Gait belt;Oxygen Activity Tolerance: Patient limited by fatigue Patient left: in chair;with call bell/phone within reach;with family/visitor present Nurse Communication: Mobility status   GP     Jolyn Lent 09/04/2013, 12:13 PM  Jolyn Lent, [place], DPT [phone]

## 2013-09-04 NOTE — Progress Notes (Signed)
Clinical social worker assisted with patient discharge to skilled nursing facility, [facility].  CSW addressed all family questions and concerns. CSW copied chart and added all important documents. CSW also set up patient transportation with [facility]. Clinical Social Worker will sign off for now as social work intervention is no longer needed.   Blaize Nipper, MSW, LCSWA [phone] 

## 2013-09-04 NOTE — Progress Notes (Signed)
Patient ID: [name]  male  PPJ:[id number]    DOB: Jan 15, 1925    DOA: 08/28/2013  PCP: Joycelyn Man, MD  Assessment/Plan:  Right hip fracture after a mechanical fall: Orthopedics was consulted and patient underwent intramedullary intertrochanteric nailing on 08/29/2013. Patient has been working with physical therapy, currently recommended skilled nursing facility for rehabilitation   Orthostatic hypotension/syncope +?+/-Vasovagal : On 08/30/2013, patient was on commode when he went unresponsive. Patient had complained of chest pain, EKG was similar to the previous EKGs, troponins were negative. Patient had no further syncopal episodes since then.   Acute blood loss anemia, postop -hgb remains stable post PRBC transfusion. Started on iron replacement.   History of MI, Cardiac arrest x 2 and chf.  Appears euvolemic, Echo in 6/14 showed and EF of 55%  Continue lasix, imdur, statin, metoprolol with hold parameters  Plavix has been restarted, continue aspirin, follow with Dr. Mare Ferrari outpatient   Peripheral neuropathy  Follow and resume carbamazepine as appropriate   Asthma  On symbicort, atrovent  Stable.  DVT Prophylaxis:  Code Status:  Family Communication: Discussed with patient and his wife in detail at the bed side  Disposition: Discharge summary was done yesterday 09/03/12, patient is pending approval for skilled nursing facility. DC summary still stands good, only change started on Iron daily.    Subjective: No complaints, waiting for skilled nursing facility  Objective: Weight change:   Intake/Output Summary (Last 24 hours) at 09/04/13 1242 Last data filed at 09/04/13 0800  Gross per 24 hour  Intake    600 ml  Output    850 ml  Net   -250 ml   Blood pressure 124/52, pulse 63, temperature 98.1 F (36.7 C), temperature source Oral, resp. rate 18, height 5\' 10"  (1.778 m), weight 71.668 kg (158 lb), SpO2 97.00%.  Physical Exam: General: Alert and awake,  oriented x3, not in any acute distress. CVS: S1-S2 clear, no murmur rubs or gallops Chest: clear to auscultation bilaterally, no wheezing, rales or rhonchi Abdomen: soft nontender, nondistended, normal bowel sounds  Extremities: no cyanosis, clubbing or edema noted bilaterally   Lab Results: Basic Metabolic Panel:  Recent Labs Lab 08/31/13 0400 09/02/13 0515  NA 136* 139  K 4.1 4.3  CL 100 102  CO2 28 28  GLUCOSE 106* 97  BUN 18 15  CREATININE 0.95 0.89  CALCIUM 8.2* 8.7   Liver Function Tests: No results found for this basename: AST, ALT, ALKPHOS, BILITOT, PROT, ALBUMIN,  in the last 168 hours No results found for this basename: LIPASE, AMYLASE,  in the last 168 hours No results found for this basename: AMMONIA,  in the last 168 hours CBC:  Recent Labs Lab 08/28/13 2235  09/02/13 0515 09/03/13 0515  WBC 7.3  < > 6.6 6.3  NEUTROABS 5.3  --   --   --   HGB 11.2*  < > 9.4* 8.6*  HCT 33.4*  < > 28.0* 25.7*  MCV 96.8  < > 94.6 94.8  PLT 226  < > 198 214  < > = values in this interval not displayed. Cardiac Enzymes:  Recent Labs Lab 08/30/13 1110 08/31/13 2009 09/01/13 0140  TROPONINI <0.30 <0.30 <0.30   BNP: No components found with this basename: POCBNP,  CBG: No results found for this basename: GLUCAP,  in the last 168 hours   Micro Results: No results found for this or any previous visit (from the past 240 hour(s)).  Studies/Results: Dg Chest 1 View  08/28/2013   CLINICAL DATA:  Right hip pain after a fall.  EXAM: CHEST - 1 VIEW  COMPARISON:  04/03/2013  FINDINGS: Stable appearance of postoperative change in the mediastinum. Heart size and pulmonary vascularity are normal for technique. Linear atelectasis or fibrosis in the right lung base is similar to previous study. Emphysematous change in the lungs. No focal consolidation or airspace disease. Tortuous aorta. Thoracic scoliosis. Stable appearance since previous study.  IMPRESSION: Emphysematous changes  in the lungs with fibrosis in the right lung base.   Electronically Signed   By: Lucienne Capers M.D.   On: 08/28/2013 22:24   Dg Hip Complete Right  08/28/2013   CLINICAL DATA:  Right hip pain after a fall.  EXAM: RIGHT HIP - COMPLETE 2+ VIEW  COMPARISON:  None.  FINDINGS: There is a comminuted intertrochanteric fracture of the right hip with mild displacement of a lesser trochanteric fragment and varus angulation of the hip. No dislocation of the hip joint. Pelvis appears intact.  IMPRESSION: Intertrochanteric fracture of the right hip.   Electronically Signed   By: Lucienne Capers M.D.   On: 08/28/2013 22:23   Dg Hip Operative Right  08/29/2013   CLINICAL DATA:  Right intra medullary femoral nail.  EXAM: DG OPERATIVE RIGHT HIP  TECHNIQUE: A single spot fluoroscopic AP image of the right hip is submitted.  COMPARISON:  08/28/2013  FINDINGS: Two intraoperative spot images demonstrate internal fixation across the right intertrochanteric fracture. Normal alignment. No hardware or bony complicating feature.  IMPRESSION: Internal fixation across the right femoral intertrochanteric fracture.   Electronically Signed   By: Rolm Baptise M.D.   On: 08/29/2013 15:11   Dg Femur Right  08/28/2013   CLINICAL DATA:  Right hip pain.  EXAM: RIGHT FEMUR - 2 VIEW  COMPARISON:  DG HIP COMPLETE*R* dated 08/28/2013  FINDINGS: Right intertrochanteric femur fracture again demonstrated. There is no evidence of fracture of the distal right femur.  IMPRESSION: No distal femur fracture. Proximal right hip fracture described on comparison film   Electronically Signed   By: Suzy Bouchard M.D.   On: 08/28/2013 23:20   Dg Femur Right Port  08/29/2013   CLINICAL DATA:  Post right femur intra medullary rod fixation  EXAM: PORTABLE RIGHT FEMUR - 2 VIEW  COMPARISON:  Right hip and femur radiographs - 08/28/2013; intraoperative radiographic images of the right hip-earlier same day  FINDINGS: Images demonstrate intra medullary rod  fixation of the femur and dynamic screw fixation of the right femoral neck, now with near anatomic alignment of previously identified intertrochanteric femur fracture. There is expected subcutaneous emphysema about the operative site.  Skin staples are seen about the upper and lateral thigh. Skin staples are seen about the medial aspect of the knee and upper calf. No radiopaque foreign body. No new fracture.  IMPRESSION: Post ORIF of intertrochanteric femur fracture without evidence of complication.   Electronically Signed   By: Sandi Mariscal M.D.   On: 08/29/2013 15:00    Medications: Scheduled Meds: . acetaminophen  650 mg Oral Q8H  . aspirin  81 mg Oral Daily  . atorvastatin  80 mg Oral QPM  . budesonide-formoterol  2 puff Inhalation BID  . carbamazepine  300 mg Oral BID  . clopidogrel  75 mg Oral Q breakfast  . docusate sodium  100 mg Oral BID  . famotidine  20 mg Oral Daily  . ferrous gluconate  324 mg Oral Q breakfast  . finasteride  5 mg  Oral Daily  . furosemide  40 mg Oral q morning - 10a  . ipratropium  2 puff Inhalation QID  . isosorbide mononitrate  90 mg Oral q1800  . metoprolol tartrate  12.5 mg Oral BID  . potassium chloride SA  20 mEq Oral Daily  . senna  1 tablet Oral BID  . sertraline  150 mg Oral q morning - 10a      LOS: 7 days   RAI,RIPUDEEP M.D. [facility] 09/04/2013, 12:42 PM Pager: [phone]  If 7PM-7AM, please contact night-coverage [email or web address] Password [email or web address]

## 2013-09-04 NOTE — Progress Notes (Signed)
Patient d/c to [facility], IV removed.  Report called and given.

## 2013-09-05 ENCOUNTER — Non-Acute Institutional Stay (SKILLED_NURSING_FACILITY): Payer: Medicare Other | Admitting: Internal Medicine

## 2013-09-05 DIAGNOSIS — D62 Acute posthemorrhagic anemia: Secondary | ICD-10-CM

## 2013-09-05 DIAGNOSIS — J449 Chronic obstructive pulmonary disease, unspecified: Secondary | ICD-10-CM

## 2013-09-05 DIAGNOSIS — S72143A Displaced intertrochanteric fracture of unspecified femur, initial encounter for closed fracture: Secondary | ICD-10-CM

## 2013-09-05 DIAGNOSIS — S72141A Displaced intertrochanteric fracture of right femur, initial encounter for closed fracture: Secondary | ICD-10-CM

## 2013-09-05 DIAGNOSIS — I10 Essential (primary) hypertension: Secondary | ICD-10-CM

## 2013-09-07 DIAGNOSIS — D62 Acute posthemorrhagic anemia: Secondary | ICD-10-CM | POA: Insufficient documentation

## 2013-09-07 NOTE — Progress Notes (Signed)
HISTORY & PHYSICAL  DATE: 09/05/2013   FACILITY: [facility]  LEVEL OF CARE: SNF (31)  ALLERGIES:  No Known Allergies  CHIEF COMPLAINT:  Manage left hip fracture, COPD and hypertension  HISTORY OF PRESENT ILLNESS: Patient is an 80 year old Caucasian male.  HIP FRACTURE: The patient had a mechanical fall and sustained a femur fracture.  Patient subsequently underwent surgical repair and tolerated the procedure well. Patient is admitted to this facility for short-term rehabilitation. Patient denies hip pain currently. No complications reported from the pain medications currently being used.  COPD: the COPD remains stable.  Pt denies sob, cough, wheezing or declining exercise tolerance.  No complications from the medications presently being used.  HTN: Pt 's HTN remains stable.  Denies CP, sob, DOE, pedal edema, headaches, dizziness or visual disturbances.  No complications from the medications currently being used.  Last BP : 107/46  PAST MEDICAL HISTORY :  Past Medical History  Diagnosis Date  . Systolic CHF     EF [id number] by echo 07/18/12  . Dyslipidemia (high LDL; low HDL)   . Coronary artery disease     s/p CABG; Anterolateral STEMI 06/2012 s/p BMS to mid LAD   . Heart murmur   . Peripheral vascular disease     thrombus- in leg- many yrs. ago- ?R- coumadin x1 mth  . Pneumonia     hosp. long time ago   . COPD (chronic obstructive pulmonary disease)     dc'd on home O2 06/2012  . Blood transfusion     post CABG  . Hepatitis     hepatitis- long time ago, occupational contamination   . GERD (gastroesophageal reflux disease)   . Cancer     prostate - Ca- radiation therapy- 2002  . Peripheral neuropathy   . Hiatal hernia   . Arthritis     hnp, lumbar, ankle (foot drop-R), wears a splint, arthritis - "all over", low bone density   . Depression     uses Zoloft   . VT (ventricular tachycardia) 06/2012    in the setting of STEMI  . Bacteremia  06/2012    in the setting of UTI/Prostatits, Blood Cx E.Coli tx w/ Bactrim  . Anemia   . Glucose intolerance (impaired glucose tolerance) 06/2012    A1C 5.8  . Prostatitis   . Myocardial infarction     several, the last was 06/2012  . H/O cardiac arrest     x2  . Anginal pain   . Hypertension   . Asthma   . History of kidney stones     PAST SURGICAL HISTORY: Past Surgical History  Procedure Laterality Date  . Coronary artery bypass graft  1986 and 1993  . Ptca  2008  . Cardiac catheterization      2010- last cath, two stents at that time  . Eye surgery      cataracts - bilateral , IOL  . Hernia repair      bilateral inguiinal repair   . Fracture surgery      heel- crushed -2002,  (hardware)   . Joint replacement      R knee- arthroscopic, x2, R great toe- 2 surgeries- /w hardware & thenlater fused by Dr. Sharol Given    . Rhinoplasty  [id number]    as a teen, followed by same procedure at 1970's   . Lumbar laminectomy/decompression microdiscectomy  08/10/2011    Procedure: LUMBAR LAMINECTOMY/DECOMPRESSION MICRODISCECTOMY;  Surgeon: Mallie Mussel  A Pool;  Location: [place] [facility];  Service: Neurosurgery;  Laterality: Right;  RIGHT Lumbar five-sacral one LAMINECTOMY, MICRODISCECTOMY  . Lumbar wound debridement  08/22/2011    Procedure: LUMBAR WOUND DEBRIDEMENT;  Surgeon: Eustace Moore;  Location: [place] NEUR[facility] ORS;  Service: Neurosurgery;  Laterality: N/A;  Repair of CSF Leak requiring laminectomy  . Coronary angioplasty with stent placement  X 4  . Cystoscopy w/ ureteral stent placement Right 02/28/2013    Procedure: CYSTOSCOPY WITH RETROGRADE PYELOGRAM/URETERAL STENT PLACEMENT;  Surgeon: Dutch Gray, MD;  Location: [facility];  Service: Urology;  Laterality: Right;  . Tonsillectomy  1940  . Appendectomy  1943  . Hammer toe surgery      right little toe  . Transurethral resection of prostate      x2  . Foot surgery Left 2000    heel fusion  . Toe surgery Right 2002  . Cystoscopy with ureteroscopy  and stent placement Right 03/23/2013    Procedure: RIGHT URETEROSCOPY, LASER LITHO AND STENT PLACEMENT;  Surgeon: Fredricka Bonine, MD;  Location: [facility];  Service: Urology;  Laterality: Right;  . Holmium laser application Right 2/99/3716    Procedure: HOLMIUM LASER APPLICATION;  Surgeon: Fredricka Bonine, MD;  Location: [facility];  Service: Urology;  Laterality: Right;  . Intramedullary (im) nail intertrochanteric Right 08/29/2013    Procedure: INTRAMEDULLARY (IM) NAIL INTERTROCHANTRIC;  Surgeon: Marianna Payment, MD;  Location: [place];  Service: Orthopedics;  Laterality: Right;    SOCIAL HISTORY:  reports that he has never smoked. He has never used smokeless tobacco. He reports that he does not drink alcohol or use illicit drugs.  FAMILY HISTORY:  Family History  Problem Relation Age of Onset  . Heart attack Mother   . Addison's disease Father   . Heart attack Sister   . Anesthesia problems Neg Hx   . Hypotension Neg Hx   . Malignant hyperthermia Neg Hx   . Pseudochol deficiency Neg Hx     CURRENT MEDICATIONS: Reviewed per [facility]  REVIEW OF SYSTEMS:  See HPI otherwise 14 point ROS is negative.  PHYSICAL EXAMINATION  VS:  T 98.1       P 64       RR 18       BP 107/46       POX%  98      WT (Lb) 158  GENERAL: no acute distress, normal body habitus EYES: conjunctivae normal, sclerae normal, normal eye lids MOUTH/THROAT: lips without lesions,no lesions in the mouth,tongue is without lesions,uvula elevates in midline NECK: supple, trachea midline, no neck masses, no thyroid tenderness, no thyromegaly LYMPHATICS: no LAN in the neck, no supraclavicular LAN RESPIRATORY: breathing is even & unlabored, BS CTAB CARDIAC: RRR, no murmur,no extra heart sounds, no edema GI:  ABDOMEN: abdomen soft, normal BS, no masses, no tenderness  LIVER/SPLEEN: no hepatomegaly, no splenomegaly MUSCULOSKELETAL: HEAD: normal to inspection & palpation BACK: no kyphosis, scoliosis or spinal  processes tenderness EXTREMITIES: LEFT UPPER EXTREMITY: full range of motion, normal strength & tone RIGHT UPPER EXTREMITY:  full range of motion, normal strength & tone LEFT LOWER EXTREMITY:  Moderate range of motion, decreased strength & tone RIGHT LOWER EXTREMITY:  range of motion not tested due to surgery, decreased strength & tone PSYCHIATRIC: the patient is alert & oriented to person, affect & behavior appropriate  LABS/RADIOLOGY:  Labs reviewed: Basic Metabolic Panel:  Recent Labs  08/30/13 0351 08/31/13 0400 09/02/13 0515  NA 139 136* 139  K 4.2 4.1 4.3  CL 100 100 102  CO2 26 28 28   GLUCOSE 155* 106* 97  BUN 18 18 15   CREATININE 1.18 0.95 0.89  CALCIUM 8.5 8.2* 8.7   Liver Function Tests:  Recent Labs  02/06/13 0946 05/03/13 0734 08/01/13 0810  AST 26 23 32  ALT 13 12 21   ALKPHOS 69 57 59  BILITOT 0.7 0.3 0.5  PROT 7.3 6.7 6.8  ALBUMIN 3.6 3.6 3.6   CBC:  Recent Labs  02/06/13 0946  08/01/13 0810 08/28/13 2235  09/01/13 0620 09/01/13 2000 09/02/13 0515 09/03/13 0515  WBC 5.5  < > 5.3 7.3  < > 7.3  --  6.6 6.3  NEUTROABS 3.3  --  2.8 5.3  --   --   --   --   --   HGB 13.0  < > 12.0* 11.2*  < > 7.9* 9.7* 9.4* 8.6*  HCT 38.6*  < > 35.4* 33.4*  < > 23.2* 28.9* 28.0* 25.7*  MCV 100.7*  < > 96.5 96.8  < > 96.3  --  94.6 94.8  PLT 231.0  < > 252.0 226  < > 176  --  198 214  < > = values in this interval not displayed.  Lipid Panel:  Recent Labs  02/06/13 0946 05/03/13 0734 08/01/13 0810  HDL 48.80 58.00 56.50   Cardiac Enzymes:  Recent Labs  08/30/13 1110 08/31/13 2009 09/01/13 0140  TROPONINI <0.30 <0.30 <0.30   EXAM: RIGHT HIP - COMPLETE 2+ VIEW   COMPARISON:  None.   FINDINGS: There is a comminuted intertrochanteric fracture of the right hip with mild displacement of a lesser trochanteric fragment and varus angulation of the hip. No dislocation of the hip joint. Pelvis appears intact.   IMPRESSION: Intertrochanteric  fracture of the right hip.     EXAM: CHEST - 1 VIEW   COMPARISON:  04/03/2013   FINDINGS: Stable appearance of postoperative change in the mediastinum. Heart size and pulmonary vascularity are normal for technique. Linear atelectasis or fibrosis in the right lung base is similar to previous study. Emphysematous change in the lungs. No focal consolidation or airspace disease. Tortuous aorta. Thoracic scoliosis. Stable appearance since previous study.   IMPRESSION: Emphysematous changes in the lungs with fibrosis in the right lung base.   EXAM: RIGHT FEMUR - 2 VIEW   COMPARISON:  DG HIP COMPLETE*R* dated 08/28/2013   FINDINGS: Right intertrochanteric femur fracture again demonstrated. There is no evidence of fracture of the distal right femur.   IMPRESSION: No distal femur fracture. Proximal right hip fracture described on comparison film     EXAM: PORTABLE RIGHT FEMUR - 2 VIEW   COMPARISON:  Right hip and femur radiographs - 08/28/2013; intraoperative radiographic images of the right hip-earlier same day   FINDINGS: Images demonstrate intra medullary rod fixation of the femur and dynamic screw fixation of the right femoral neck, now with near anatomic alignment of previously identified intertrochanteric femur fracture. There is expected subcutaneous emphysema about the operative site.   Skin staples are seen about the upper and lateral thigh. Skin staples are seen about the medial aspect of the knee and upper calf. No radiopaque foreign body. No new fracture.   IMPRESSION: Post ORIF of intertrochanteric femur fracture without evidence of complication. DG OPERATIVE RIGHT HIP   TECHNIQUE: A single spot fluoroscopic AP image of the right hip is submitted.   COMPARISON:  08/28/2013   FINDINGS: Two intraoperative spot images demonstrate internal fixation  across the right intertrochanteric fracture. Normal alignment. No hardware or bony complicating feature.    IMPRESSION: Internal fixation across the right femoral intertrochanteric fracture.  ASSESSMENT/PLAN:  Right hip fracture-status post ORIF. Continue rehabilitation COPD-compensated. Hypertension-well-controlled Acute blood loss anemia-status post transfusion. Reassess CAD-stable Check CBC  I have reviewed patient's medical records received at admission/from hospitalization.  CPT CODE: 93235

## 2013-09-14 ENCOUNTER — Other Ambulatory Visit: Payer: Self-pay | Admitting: *Deleted

## 2013-09-14 MED ORDER — OXYCODONE HCL 5 MG PO TABS: ORAL_TABLET | ORAL | Status: DC

## 2013-09-26 ENCOUNTER — Non-Acute Institutional Stay (SKILLED_NURSING_FACILITY): Payer: Medicare Other | Admitting: Adult Health

## 2013-09-26 DIAGNOSIS — E785 Hyperlipidemia, unspecified: Secondary | ICD-10-CM

## 2013-09-26 DIAGNOSIS — F32A Depression, unspecified: Secondary | ICD-10-CM

## 2013-09-26 DIAGNOSIS — S72143A Displaced intertrochanteric fracture of unspecified femur, initial encounter for closed fracture: Secondary | ICD-10-CM

## 2013-09-26 DIAGNOSIS — G609 Hereditary and idiopathic neuropathy, unspecified: Secondary | ICD-10-CM

## 2013-09-26 DIAGNOSIS — J449 Chronic obstructive pulmonary disease, unspecified: Secondary | ICD-10-CM

## 2013-09-26 DIAGNOSIS — I5020 Unspecified systolic (congestive) heart failure: Secondary | ICD-10-CM

## 2013-09-26 DIAGNOSIS — F329 Major depressive disorder, single episode, unspecified: Secondary | ICD-10-CM

## 2013-09-26 DIAGNOSIS — G629 Polyneuropathy, unspecified: Secondary | ICD-10-CM

## 2013-09-26 DIAGNOSIS — K219 Gastro-esophageal reflux disease without esophagitis: Secondary | ICD-10-CM

## 2013-09-26 DIAGNOSIS — F3289 Other specified depressive episodes: Secondary | ICD-10-CM

## 2013-09-26 DIAGNOSIS — S72141A Displaced intertrochanteric fracture of right femur, initial encounter for closed fracture: Secondary | ICD-10-CM

## 2013-09-26 DIAGNOSIS — I10 Essential (primary) hypertension: Secondary | ICD-10-CM

## 2013-09-26 DIAGNOSIS — I951 Orthostatic hypotension: Secondary | ICD-10-CM

## 2013-09-26 NOTE — Progress Notes (Signed)
Patient ID: [name], male   DOB: [birth year], 80 y.o.   MRN: [id number]              PROGRESS NOTE  DATE: 09/26/2013   FACILITY: [facility]  LEVEL OF CARE: SNF (31)  Acute Visit  CHIEF COMPLAINT:  Discharge Notes  HISTORY OF PRESENT ILLNESS: This is an 80 year old male who is for discharge home with Home health PT, OT and Nursing. DME: wheelchair and rolling walker. He has been admitted to [facility] on 09/04/13 from [facility] with Intertrochanteric Fracture of right hip S/P Intramedullary intertrochanteric nailing. Patient was admitted to this facility for short-term rehabilitation after the patient's recent hospitalization.  Patient has completed SNF rehabilitation and therapy has cleared the patient for discharge.  Reassessment of ongoing problem(s):  CHF:The patient does not relate significant weight changes, denies sob, DOE, orthopnea, PNDs, pedal edema, palpitations or chest pain.  CHF remains stable.  No complications form the medications being used.  COPD: the COPD remains stable.  Pt denies sob, cough, wheezing or declining exercise tolerance.  No complications from the medications presently being used.  HTN: Pt 's HTN remains stable.  Denies CP, sob, DOE, pedal edema, headaches, dizziness or visual disturbances.  No complications from the medications currently being used.  Last BP :121/62  PAST MEDICAL HISTORY : Reviewed.  No changes.  CURRENT MEDICATIONS: Reviewed per [facility]  REVIEW OF SYSTEMS:  GENERAL: no change in appetite, no fatigue, no weight changes, no fever, chills or weakness RESPIRATORY: no cough, SOB, DOE, wheezing, hemoptysis CARDIAC: no chest pain, edema or palpitations GI: no abdominal pain, diarrhea, constipation, heart burn, nausea or vomiting  PHYSICAL EXAMINATION  VS:  T98.2       P60       RR20      BP121/62      POX96 %       WT 155.2(Lb)  GENERAL: no acute distress, normal body habitus EYES: conjunctivae normal,  sclerae normal, normal eye lids NECK: supple, trachea midline, no neck masses, no thyroid tenderness, no thyromegaly LYMPHATICS: no LAN in the neck, no supraclavicular LAN RESPIRATORY: breathing is even & unlabored, BS CTAB CARDIAC: RRR, no murmur,no extra heart sounds, no edema GI: abdomen soft, normal BS, no masses, no tenderness, no hepatomegaly, no splenomegaly PSYCHIATRIC: the patient is alert & oriented to person, affect & behavior appropriate  LABS/RADIOLOGY: Labs reviewed: Basic Metabolic Panel:  Recent Labs  08/30/13 0351 08/31/13 0400 09/02/13 0515  NA 139 136* 139  K 4.2 4.1 4.3  CL 100 100 102  CO2 26 28 28   GLUCOSE 155* 106* 97  BUN 18 18 15   CREATININE 1.18 0.95 0.89  CALCIUM 8.5 8.2* 8.7   Liver Function Tests:  Recent Labs  02/06/13 0946 05/03/13 0734 08/01/13 0810  AST 26 23 32  ALT 13 12 21   ALKPHOS 69 57 59  BILITOT 0.7 0.3 0.5  PROT 7.3 6.7 6.8  ALBUMIN 3.6 3.6 3.6   CBC:  Recent Labs  02/06/13 0946  08/01/13 0810 08/28/13 2235  09/01/13 0620 09/01/13 2000 09/02/13 0515 09/03/13 0515  WBC 5.5  < > 5.3 7.3  < > 7.3  --  6.6 6.3  NEUTROABS 3.3  --  2.8 5.3  --   --   --   --   --   HGB 13.0  < > 12.0* 11.2*  < > 7.9* 9.7* 9.4* 8.6*  HCT 38.6*  < > 35.4* 33.4*  < >  23.2* 28.9* 28.0* 25.7*  MCV 100.7*  < > 96.5 96.8  < > 96.3  --  94.6 94.8  PLT 231.0  < > 252.0 226  < > 176  --  198 214  < > = values in this interval not displayed. Lipid Panel:  Recent Labs  02/06/13 0946 05/03/13 0734 08/01/13 0810  HDL 48.80 58.00 56.50   Cardiac Enzymes:  Recent Labs  08/30/13 1110 08/31/13 2009 09/01/13 0140  TROPONINI <0.30 <0.30 <0.30    ASSESSMENT/PLAN:  Intratrochanteric fracture of right hip status post intramedullary intertrochanteric nailing - for Home health PT, OT, CNA and Nursing  Systolic CHF - stable; continue Lasix  Hypertension - well controlled; continue Toprol XL  Orthostatic hypotension - follow-up consult with  cardiologist  Depression - continue Zoloft  GERD - stable; continue Zantac  COPD - continue Symbicort  Peripheral neuropathy - stable; continue Carbatrol  Hyperlipidemia - continue Lipitor    I have filled out patient's discharge paperwork and written prescriptions.  Patient will receive home health PT, OT, nursing and CNA. DME provided: Wheelchair and rolling walker  Total discharge time: Greater than 30 minutes Discharge time involved coordination of the discharge process with Education officer, museum, nursing staff and therapy department. Medical justification for home health services/DME verified.  CPT CODE: 30076

## 2013-10-02 ENCOUNTER — Encounter: Payer: Self-pay | Admitting: Physician Assistant

## 2013-10-02 ENCOUNTER — Ambulatory Visit (INDEPENDENT_AMBULATORY_CARE_PROVIDER_SITE_OTHER): Payer: Medicare Other | Admitting: Physician Assistant

## 2013-10-02 VITALS — BP 106/63 | HR 65 | Ht 70.0 in | Wt 153.0 lb

## 2013-10-02 DIAGNOSIS — I2589 Other forms of chronic ischemic heart disease: Secondary | ICD-10-CM

## 2013-10-02 DIAGNOSIS — I10 Essential (primary) hypertension: Secondary | ICD-10-CM

## 2013-10-02 DIAGNOSIS — I2510 Atherosclerotic heart disease of native coronary artery without angina pectoris: Secondary | ICD-10-CM

## 2013-10-02 DIAGNOSIS — I255 Ischemic cardiomyopathy: Secondary | ICD-10-CM

## 2013-10-02 DIAGNOSIS — E785 Hyperlipidemia, unspecified: Secondary | ICD-10-CM

## 2013-10-02 DIAGNOSIS — I951 Orthostatic hypotension: Secondary | ICD-10-CM

## 2013-10-02 DIAGNOSIS — R55 Syncope and collapse: Secondary | ICD-10-CM

## 2013-10-02 DIAGNOSIS — I259 Chronic ischemic heart disease, unspecified: Secondary | ICD-10-CM

## 2013-10-02 MED ORDER — SENNA 8.6 MG PO TABS: 1.0000 | ORAL_TABLET | Freq: Two times a day (BID) | ORAL | Status: DC | PRN

## 2013-10-02 MED ORDER — FERROUS GLUCONATE 324 (38 FE) MG PO TABS: 324.0000 mg | ORAL_TABLET | Freq: Every day | ORAL | Status: DC

## 2013-10-02 MED ORDER — POLYETHYLENE GLYCOL 3350 17 G PO PACK: 17.0000 g | PACK | Freq: Every day | ORAL | Status: DC | PRN

## 2013-10-02 MED ORDER — DSS 100 MG PO CAPS: 100.0000 mg | ORAL_CAPSULE | Freq: Two times a day (BID) | ORAL | Status: DC

## 2013-10-02 NOTE — Progress Notes (Signed)
[place], [place], [place]  [address] Phone: [phone] Fax:  [phone]  Date:  10/02/2013   ID:  [name], DOB Jul 11, 1925, MRN [id number]  PCP:  Joycelyn Man, MD  Cardiologist:  Dr. Darlin Coco     History of Present Illness: [name] is a 80 y.o. male with a hx of CAD, s/p CABG in 1986 and redo CABG in 1993, PCI in 2008 and 2010 (DES), ICM, systolic CHF, HTN, COPD, HL, prostate CA status post radiation.  He was admitted in 06/2012 with an acute AL STEMI c/b VTach requiring cardioversion.  LHC (07/15/2012):  oLM 50, pLAD occluded, mLAD 90 after LIMA, pCFX 50-60, pRCA occluded, prox S-OM occluded (previously placed stents occluded), prox S-Dx occluded (revasc vessel in 2010), LIMA-LAD ok, RIMA-RCA ok, EF 50% ant HK.  PCI:  BMS to mid LAD.  EF previously was 40-45% in 06/2012.  Last echo (01/29/13): Mild focal basal hypertrophy of the septum, EF 50-55%, anteroseptal HK, grade 1 diastolic dysfunction, trivial AI, mild MR, moderate LAE, mild RVE, mild to moderate RAE, moderate TR, PASP 39.  Last seen by Dr. Mare Ferrari 08/08/2013.  He was admitted 12/30-1/5 after suffering a right hip fracture secondary to mechanical fall. He underwent intramedullary intertrochanteric nailing on 08/29/13. Notes indicate patient had a syncopal episode related to orthostatic hypotension/vasovagal.  Of note, the patient did have significant vomiting prior to his syncopal episode.  Cardiac enzymes remained normal. He did require transfusion with packed red blood cells for blood loss anemia.  He was discharged to [facility] for rehabilitation. He is now back home. He denies chest pain, dyspnea, orthopnea, PND. He does sleep with O2 at night. He denies significant pedal edema. He is wearing his compression stockings. He denies syncope. He does tell me that there was some difficulty with orthostatic hypotension at the nursing facility. He has been taking his medications on a staggered  basis in the past in order to alleviate some of his orthostatic hypotension. This was not being observed in the SNF.   Recent Labs: 08/01/2013: ALT 21; HDL Cholesterol 56.50; LDL (calc) 101*  09/02/2013: Creatinine 0.89; Potassium 4.3  09/03/2013: Hemoglobin 8.6*   Wt Readings from Last 3 Encounters:  10/02/13 153 lb (69.4 kg)  08/29/13 158 lb (71.668 kg)  08/29/13 158 lb (71.668 kg)     Past Medical History  Diagnosis Date  . Systolic CHF     EF [id number] by echo 07/18/12  . Dyslipidemia (high LDL; low HDL)   . Coronary artery disease     s/p CABG; Anterolateral STEMI 06/2012 s/p BMS to mid LAD   . Heart murmur   . Peripheral vascular disease     thrombus- in leg- many yrs. ago- ?R- coumadin x1 mth  . Pneumonia     hosp. long time ago   . COPD (chronic obstructive pulmonary disease)     dc'd on home O2 06/2012  . Blood transfusion     post CABG  . Hepatitis     hepatitis- long time ago, occupational contamination   . GERD (gastroesophageal reflux disease)   . Cancer     prostate - Ca- radiation therapy- 2002  . Peripheral neuropathy   . Hiatal hernia   . Arthritis     hnp, lumbar, ankle (foot drop-R), wears a splint, arthritis - "all over", low bone density   . Depression     uses Zoloft   . VT (ventricular tachycardia) 06/2012  in the setting of STEMI  . Bacteremia 06/2012    in the setting of UTI/Prostatits, Blood Cx E.Coli tx w/ Bactrim  . Anemia   . Glucose intolerance (impaired glucose tolerance) 06/2012    A1C 5.8  . Prostatitis   . Myocardial infarction     several, the last was 06/2012  . H/O cardiac arrest     x2  . Anginal pain   . Hypertension   . Asthma   . History of kidney stones     Current Outpatient Prescriptions  Medication Sig Dispense Refill  . acetaminophen (TYLENOL) 500 MG tablet Take 500 mg by mouth 2 (two) times daily. Once in the am Once in the pm      . aspirin 81 MG tablet Take 81 mg by mouth daily.   30 tablet  6  .  atorvastatin (LIPITOR) 80 MG tablet Take 80 mg by mouth every evening.   30 tablet  6  . budesonide-formoterol (SYMBICORT) 80-4.5 MCG/ACT inhaler Inhale 2 puffs into the lungs 2 (two) times daily.      . carbamazepine (CARBATROL) 300 MG 12 hr capsule Take 300 mg by mouth 2 (two) times daily.       . clopidogrel (PLAVIX) 75 MG tablet Take 75 mg by mouth daily.        . finasteride (PROSCAR) 5 MG tablet Take 5 mg by mouth daily.       . furosemide (LASIX) 40 MG tablet Take 40 mg by mouth every morning.      [name] ipratropium (ATROVENT HFA) 17 MCG/ACT inhaler Inhale 2 puffs into the lungs 2 (two) times daily as needed for wheezing.       . isosorbide mononitrate (IMDUR) 60 MG 24 hr tablet Take 90 mg by mouth daily.      . methocarbamol (ROBAXIN) 500 MG tablet Take 1 tablet (500 mg total) by mouth every 6 (six) hours as needed for muscle spasms.  30 tablet  0  . metoprolol succinate (TOPROL-XL) 25 MG 24 hr tablet Take 25 mg by mouth every morning.      [name] NITROSTAT 0.4 MG SL tablet PLACE 1 TABLET UNDER THE TONGUE EVERY 5 MINUTES ASDIRECTED BY PHYSICIAN  25 tablet  11  . oxyCODONE (OXY IR/ROXICODONE) 5 MG immediate release tablet Take one tablet by mouth every 4 hours as needed for mild pain; Take two tablets by mouth every 4 hours s needed for moderate pain; Take three tablets by mouth every 4 hours as needed for severe pain  360 tablet  0  . polyethylene glycol (MIRALAX / GLYCOLAX) packet Take 17 g by mouth daily as needed for mild constipation.  14 each  0  . potassium chloride SA (K-DUR,KLOR-CON) 20 MEQ tablet Take 20 mEq by mouth daily.       . ranitidine (ZANTAC) 150 MG tablet Take 1 tablet (150 mg total) by mouth 2 (two) times daily.  90 tablet  3  . sertraline (ZOLOFT) 100 MG tablet Take 150 mg by mouth every morning.       . temazepam (RESTORIL) 15 MG capsule Take one capsule by mouth every night at bedtime as needed for sleep  30 capsule  5  . docusate sodium 100 MG CAPS Take 100 mg by mouth 2 (two)  times daily.  10 capsule  0  . ferrous gluconate (FERGON) 324 MG tablet Take 1 tablet (324 mg total) by mouth daily with breakfast.    3  . senna (SENOKOT)  8.6 MG TABS tablet Take 1 tablet (8.6 mg total) by mouth 2 (two) times daily.  120 each  0   No current facility-administered medications for this visit.    Allergies:   Review of patient's allergies indicates no known allergies.   Social History:  The patient  reports that he has never smoked. He has never used smokeless tobacco. He reports that he does not drink alcohol or use illicit drugs.   Family History:  The patient's family history includes Addison's disease in his father; Heart attack in his mother and sister. There is no history of Anesthesia problems, Hypotension, Malignant hyperthermia, or Pseudochol deficiency.   ROS:  Please see the history of present illness.    All other systems reviewed and negative.   PHYSICAL EXAM: VS:  BP 106/63  Pulse 65  Ht 5\' 10"  (1.778 m)  Wt 153 lb (69.4 kg)  BMI 21.95 kg/m2  Filed Vitals:   10/02/13 1053 10/02/13 1113 10/02/13 1114  BP: 136/58 111/65 106/63  Pulse: 64 60 65  Height: 5\' 10"  (1.778 m)    Weight: 153 lb (69.4 kg)       Thin frail elderly male sitting in a wheelchair in no acute distress HEENT: normal Neck: no JVDat 90 Cardiac:  normal S1, S2; RRR; no murmur Lungs:  clear to auscultation bilaterally, no wheezing, rhonchi or rales Abd: soft, nontender, no hepatomegaly Ext: no edema Skin: warm and dry Neuro:  CNs 2-12 intact, no focal abnormalities noted  EKG:  NSR, HR 64, LBBB     ASSESSMENT AND PLAN:  1. CAD: Stable. No angina. Continue aspirin, Plavix, statin, nitrates, beta blocker. 2. Ischemic Cardiomyopathy: Ejection fraction had returned to normal by most recent echocardiogram. Continue current therapy. No evidence of volume overload. 3. Hypertension: Controlled. 4. Hyperlipidemia: Continue statin. 5. Right Hip Fracture, status post IM Nailing: Continue  followup with orthopedics. 6. Orthostatic Hypotension: Controlled.  Continue current regimen. Continue present stockings. 7. Syncope: Episode of syncope in the hospital was likely related to low blood volume as well as vasovagal syncope. No further workup needed.  8. Disposition: I spent > 20 minutes reviewing records from his recent hospitalization today.  Face to face time was > 50%.  Follow up with Dr. Mare Ferrari in 10/2013 as planned.  Signed, Richardson Dopp, PA-C  10/02/2013 11:18 AM

## 2013-10-02 NOTE — Patient Instructions (Signed)
MAKE SURE TO FOLLOW UP WITH DR. Mare Ferrari 11/07/13  REFILLS HAVE BEEN SENT IN FOR THE [place], [place], [place]  Your physician recommends that you continue on your current medications as directed. Please refer to the Current Medication list given to you today.

## 2013-10-13 ENCOUNTER — Encounter: Payer: Self-pay | Admitting: Internal Medicine

## 2013-10-15 ENCOUNTER — Other Ambulatory Visit: Payer: Self-pay | Admitting: Internal Medicine

## 2013-10-15 DIAGNOSIS — J9610 Chronic respiratory failure, unspecified whether with hypoxia or hypercapnia: Secondary | ICD-10-CM

## 2013-10-25 ENCOUNTER — Encounter: Payer: Self-pay | Admitting: Cardiology

## 2013-10-29 ENCOUNTER — Telehealth: Payer: Self-pay | Admitting: *Deleted

## 2013-10-29 NOTE — Telephone Encounter (Signed)
Had 3 episodes last week but none since then. Offered appointment with Tera Helper NP and patient preferred just keeping ov with  Dr. Mare Ferrari next week. Stated he would continue to monitor and call back if any more episodes.   ----- Message ----- From: Tarry Kos Sent: 10/25/2013 2:53 PM To: [address] Triage Subject: Non-Urgent Medical Question On three different afternoons, I have checked my oximeter which registers my pulse, and it has been in the 30's. My blood pressure cuff just measured "LO" for the pulse. After a few breaths through my nose and out through my mouth, the pulse rate came back to about sixty. These are isolated events, but my physical therapist thought I ought to notify you

## 2013-10-29 NOTE — Telephone Encounter (Signed)
Agree with plan.  Consider 48 hour holter if spells continue.

## 2013-10-31 ENCOUNTER — Other Ambulatory Visit (INDEPENDENT_AMBULATORY_CARE_PROVIDER_SITE_OTHER): Payer: Medicare Other

## 2013-10-31 DIAGNOSIS — R5381 Other malaise: Secondary | ICD-10-CM

## 2013-10-31 DIAGNOSIS — R5383 Other fatigue: Secondary | ICD-10-CM

## 2013-10-31 DIAGNOSIS — R531 Weakness: Secondary | ICD-10-CM

## 2013-10-31 LAB — BASIC METABOLIC PANEL
BUN: 13 mg/dL (ref 6–23)
CALCIUM: 9.6 mg/dL (ref 8.4–10.5)
CO2: 31 meq/L (ref 19–32)
CREATININE: 0.9 mg/dL (ref 0.4–1.5)
Chloride: 104 mEq/L (ref 96–112)
GFR: 86.01 mL/min (ref >60.00)
GLUCOSE: 90 mg/dL (ref 70–99)
Potassium: 4.3 mEq/L (ref 3.5–5.1)
Sodium: 138 mEq/L (ref 135–145)

## 2013-10-31 LAB — CBC WITH DIFFERENTIAL/PLATELET
BASOS ABS: 0 10*3/uL (ref 0.0–0.1)
Basophils Relative: 0.8 % (ref 0.0–3.0)
EOS ABS: 0.3 10*3/uL (ref 0.0–0.7)
Eosinophils Relative: 5.4 % — ABNORMAL HIGH (ref 0.0–5.0)
HCT: 38.4 % — ABNORMAL LOW (ref 39.0–52.0)
Hemoglobin: 12.6 g/dL — ABNORMAL LOW (ref 13.0–17.0)
LYMPHS PCT: 39.2 % (ref 12.0–46.0)
Lymphs Abs: 2.2 10*3/uL (ref 0.7–4.0)
MCHC: 32.9 g/dL (ref 30.0–36.0)
MCV: 98.3 fl (ref 78.0–100.0)
MONOS PCT: 8.7 % (ref 3.0–12.0)
Monocytes Absolute: 0.5 10*3/uL (ref 0.1–1.0)
NEUTROS ABS: 2.6 10*3/uL (ref 1.4–7.7)
NEUTROS PCT: 45.9 % (ref 43.0–77.0)
PLATELETS: 295 10*3/uL (ref 150.0–400.0)
RBC: 3.9 Mil/uL — ABNORMAL LOW (ref 4.22–5.81)
RDW: 15.8 % — AB (ref 11.5–14.6)
WBC: 5.7 10*3/uL (ref 4.5–10.5)

## 2013-11-01 NOTE — Progress Notes (Signed)
Quick Note:  Please make copy of labs for patient visit. ______ 

## 2013-11-07 ENCOUNTER — Other Ambulatory Visit: Payer: Medicare Other

## 2013-11-07 ENCOUNTER — Ambulatory Visit (INDEPENDENT_AMBULATORY_CARE_PROVIDER_SITE_OTHER): Payer: Medicare Other | Admitting: Cardiology

## 2013-11-07 ENCOUNTER — Encounter: Payer: Self-pay | Admitting: Cardiology

## 2013-11-07 VITALS — BP 119/54 | HR 66 | Ht 70.0 in | Wt 158.8 lb

## 2013-11-07 DIAGNOSIS — I447 Left bundle-branch block, unspecified: Secondary | ICD-10-CM

## 2013-11-07 DIAGNOSIS — I259 Chronic ischemic heart disease, unspecified: Secondary | ICD-10-CM

## 2013-11-07 DIAGNOSIS — E785 Hyperlipidemia, unspecified: Secondary | ICD-10-CM

## 2013-11-07 DIAGNOSIS — S72141A Displaced intertrochanteric fracture of right femur, initial encounter for closed fracture: Secondary | ICD-10-CM

## 2013-11-07 DIAGNOSIS — S72143A Displaced intertrochanteric fracture of unspecified femur, initial encounter for closed fracture: Secondary | ICD-10-CM

## 2013-11-07 DIAGNOSIS — I2510 Atherosclerotic heart disease of native coronary artery without angina pectoris: Secondary | ICD-10-CM

## 2013-11-07 DIAGNOSIS — I951 Orthostatic hypotension: Secondary | ICD-10-CM

## 2013-11-07 NOTE — Assessment & Plan Note (Signed)
The patient has not been experiencing any recent angina pectoris.  He has not had to take any recent sublingual nitroglycerin.

## 2013-11-07 NOTE — Progress Notes (Signed)
[name] Date of Birth:  December 19, 1924 [place],   [address] [phone]         Fax   [phone]  History of Present Illness: This pleasant 80 year old gentleman is seen back for a scheduled 3 month followup office visit. He has a history of extensive ischemic heart disease. He had coronary artery bypass graft surgery in 1986. He had a redo CABG in 1993. He had PTCA with stent in October 2008. He had catheter with a drug-eluting stent in February 2010. He had a small non-Q MI in August 2010. His last nuclear stress test was 10/27/09 showing a moderate area of reversible ischemia in the inferolateral wall with an ejection fraction of 64%.  He presented to the emergency room on 07/15/12 after falling at home.  While in the emergency room he developed chest pain followed by ventricular tachycardia requiring cardioversion.  He was taken emergently to the catheter lab treated with a bare-metal stent to the mid LAD was treated initially with IV amiodarone which was subsequently stopped prior to discharge.  His echocardiogram November 2013 showed ejection fraction 40-45% and moderate left atrial enlargement. He has been treated by urologist Dr. [name] for right hydronephrosis secondary to renal calculus in the right ureter. Since last visit he was hospitalized on 08/28/13 for acute right hip fracture suffered in a mechanical fall.  He was getting out of the limousine following his sister-in-law's funeral when his knee buckled and gave out and he fell to the ground.  Dr. [name] is his orthopedist.  He tolerated the surgery okay from a cardiac standpoint with general anesthesia.  He states he did receive 2 units of packed cells in the hospital for severe anemia.  He is still taking oral iron.  Current Outpatient Prescriptions  Medication Sig Dispense Refill  . acetaminophen (TYLENOL) 500 MG tablet Take 500 mg by mouth 2 (two) times daily. Once in the am Once in the  pm      . aspirin 81 MG tablet Take 81 mg by mouth daily.   30 tablet  6  . atorvastatin (LIPITOR) 80 MG tablet Take 80 mg by mouth every evening.   30 tablet  6  . budesonide-formoterol (SYMBICORT) 80-4.5 MCG/ACT inhaler Inhale 2 puffs into the lungs 2 (two) times daily.      . carbamazepine (CARBATROL) 300 MG 12 hr capsule Take 300 mg by mouth 2 (two) times daily.       . clopidogrel (PLAVIX) 75 MG tablet Take 75 mg by mouth daily.        Mariane Baumgarten Sodium (DSS) 100 MG CAPS Take 100 mg by mouth 2 (two) times daily.      . ferrous gluconate (FERGON) 324 MG tablet Take 1 tablet (324 mg total) by mouth daily with breakfast.  60 tablet  2  . finasteride (PROSCAR) 5 MG tablet Take 5 mg by mouth daily.       . furosemide (LASIX) 40 MG tablet Take 40 mg by mouth every morning.      [name] ipratropium (ATROVENT HFA) 17 MCG/ACT inhaler Inhale 2 puffs into the lungs 2 (two) times daily as needed for wheezing.       . isosorbide mononitrate (IMDUR) 60 MG 24 hr tablet Take 90 mg by mouth daily.      . methocarbamol (ROBAXIN) 500 MG tablet Take 1 tablet (500 mg total) by mouth every 6 (six) hours as needed for muscle spasms.  [place]  tablet  0  . metoprolol succinate (TOPROL-XL) 25 MG 24 hr tablet Take 25 mg by mouth every morning.      [name] NITROSTAT 0.4 MG SL tablet PLACE 1 TABLET UNDER THE TONGUE EVERY 5 MINUTES ASDIRECTED BY PHYSICIAN  25 tablet  11  . oxyCODONE (OXY IR/ROXICODONE) 5 MG immediate release tablet Take one tablet by mouth every 4 hours as needed for mild pain; Take two tablets by mouth every 4 hours s needed for moderate pain; Take three tablets by mouth every 4 hours as needed for severe pain  360 tablet  0  . polyethylene glycol (MIRALAX / GLYCOLAX) packet Take 17 g by mouth daily as needed for mild constipation.  14 each  2  . potassium chloride SA (K-DUR,KLOR-CON) 20 MEQ tablet Take 20 mEq by mouth daily.       . ranitidine (ZANTAC) 150 MG tablet Take 1 tablet (150 mg total) by mouth 2 (two) times  daily.  90 tablet  3  . senna (SENOKOT) 8.6 MG TABS tablet Take 1 tablet (8.6 mg total) by mouth 2 (two) times daily as needed for mild constipation.  60 each  2  . sertraline (ZOLOFT) 100 MG tablet Take 150 mg by mouth every morning.       . temazepam (RESTORIL) 15 MG capsule Take one capsule by mouth every night at bedtime as needed for sleep  30 capsule  5   No current facility-administered medications for this visit.    No Known Allergies  Patient Active Problem List   Diagnosis Date Noted  . Dizziness     Priority: High  . HYPERLIPIDEMIA 01/05/2007    Priority: Medium  . Cor Athrscl-Uns Vessel 01/05/2007    Priority: Medium  . Systolic CHF 47/82/9562  . Depression 09/26/2013  . Orthostatic hypotension 09/26/2013  . Acute posthemorrhagic anemia 09/07/2013  . Intertrochanteric fracture of right hip 08/29/2013  . Hip fracture 08/29/2013  . Hypertension   . GERD (gastroesophageal reflux disease)   . COPD (chronic obstructive pulmonary disease)   . H/O cardiac arrest   . Malaise and fatigue 05/09/2013  . Junctional bradycardia 07/25/2012  . Glucose intolerance (impaired glucose tolerance) 07/19/2012  . Normocytic anemia 07/19/2012  . SIRS (systemic inflammatory response syndrome) 07/17/2012  . Bacteremia due to Escherichia coli 07/17/2012  . Acute MI, anterolateral wall 07/16/2012  . Laceration of head 07/16/2012  . Prostatitis 07/16/2012  . E. coli UTI (urinary tract infection) 07/16/2012  . Elevated transaminase level 07/16/2012  . Foot drop, right 07/28/2011  . LBBB (left bundle branch block) 03/29/2011  . Hx of CABG 03/29/2011  . Ischemic heart disease   . Myocardial infarction   . Acute systolic congestive heart failure, NYHA class 2-EF 40-45%   . Chronic respiratory failure   . Chronic asthma   . Peripheral neuropathy   . Dyslipidemia (high LDL; low HDL)   . PERIPHERAL NEUROPATHY 04/04/2007  . HYPERTENSION 04/04/2007  . PSORIASIS 04/04/2007  . OSTEOARTHRITIS  04/04/2007  . OSTEOPOROSIS 04/04/2007  . PROSTATE CANCER, HX OF 04/04/2007  . GERD 01/05/2007  . HERNIA 01/05/2007    History  Smoking status  . Never Smoker   Smokeless tobacco  . Never Used    History  Alcohol Use No    Family History  Problem Relation Age of Onset  . Heart attack Mother   . Addison's disease Father   . Heart attack Sister   . Anesthesia problems Neg Hx   . Hypotension Neg  Hx   . Malignant hyperthermia Neg Hx   . Pseudochol deficiency Neg Hx     Review of Systems: Constitutional: no fever chills diaphoresis or fatigue or change in weight.  Head and neck: no hearing loss, no epistaxis, no photophobia or visual disturbance. Respiratory: No cough, shortness of breath or wheezing. Cardiovascular: No chest pain peripheral edema, palpitations. Gastrointestinal: No abdominal distention, no abdominal pain, no change in bowel habits hematochezia or melena. Genitourinary: No dysuria, no frequency, no urgency, no nocturia. Musculoskeletal:No arthralgias, no back pain, no gait disturbance or myalgias. Neurological: No dizziness, no headaches, no numbness, no seizures, no syncope, no weakness, no tremors. Hematologic: No lymphadenopathy, no easy bruising. Psychiatric: No confusion, no hallucinations, no sleep disturbance.    Physical Exam: Filed Vitals:   11/07/13 0834  BP: 119/54  Pulse: 66   the general appearance reveals a well-developed elderly gentleman in no distress.The head and neck exam reveals pupils equal and reactive.  Extraocular movements are full.  There is no scleral icterus.  The mouth and pharynx are normal.  The neck is supple.  The carotids reveal no bruits.  The jugular venous pressure is normal.  The  thyroid is not enlarged.  There is no lymphadenopathy.  The chest is clear to percussion and auscultation.  There are no rales or rhonchi.  Expansion of the chest is symmetrical.  The precordium is quiet.  The first heart sound is normal.  The  second heart sound is physiologically split.  There is a grade 2/6 systolic ejection murmur at the base.  No diastolic murmur.  No gallop or rub..  There is no abnormal lift or heave.  The abdomen is soft and nontender.  The bowel sounds are normal.  The liver and spleen are not enlarged.  There are no abdominal masses.  There are no abdominal bruits.  Extremities reveal good pedal pulses.  There is no phlebitis or edema.  There is no cyanosis or clubbing.  Strength is normal and symmetrical in all extremities.  There is no lateralizing weakness.  There are no sensory deficits.  The skin is warm and dry.  There is no rash.    Assessment / Plan: Continue same medication.  He still uses home oxygen at night because his oxygen saturations dropped during sleep as determined by overnight oximetry.  We will plan to recheck him in 3 months for followup office visit, EKG, CBC basal metabolic panel, lipid panel, hepatic function panel.

## 2013-11-07 NOTE — Assessment & Plan Note (Signed)
The patient has known left bundle branch block.  He states that occasionally his pulse will drop from the 60s to the low 30s briefly.  He will take a few deep breaths and and his pulse goes back into the 60s.  He may be having bigeminy PVCs which are not being recorded or sense by his machine.  As long as the episodes are brief we do not need to make any change in his medicines.

## 2013-11-07 NOTE — Assessment & Plan Note (Signed)
The patient walks with a walker.  His orthopedist told him it would be about a year before he was back to baseline.  He uses oxycodone before he goes to physical therapy sessions.  He is still having moderate discomfort in the right leg.

## 2013-11-07 NOTE — Patient Instructions (Signed)
Your physician recommends that you continue on your current medications as directed. Please refer to the Current Medication list given to you today.  Your physician recommends that you schedule a follow-up appointment in: 3 months with fasting labs (LP/BMET/HFP/CBC) and ekg

## 2013-11-07 NOTE — Assessment & Plan Note (Signed)
The patient is not having any symptoms of orthostatic hypotension now he takes his metoprolol in the morning and his indoor later in the day and this seems to work out well.

## 2013-12-04 ENCOUNTER — Telehealth: Payer: Self-pay | Admitting: *Deleted

## 2013-12-04 ENCOUNTER — Encounter: Payer: Self-pay | Admitting: Cardiology

## 2013-12-04 MED ORDER — METOPROLOL SUCCINATE ER 25 MG PO TB24: ORAL_TABLET | ORAL | Status: DC

## 2013-12-04 NOTE — Telephone Encounter (Signed)
-----   Message -----    From: Tarry Kos    Sent: 12/04/2013 11:15 AM    To: [address] Triage    Subject: Non-Urgent Medical Question         I am still experiencing the same symptoms of every two or three days have a high pulse reading when I am taking my blood pressure. This morning, I had a pulse of 99 when taking my blood pressure and at the same time I had a pulse of 33 on my oximeter. By "feel" I had a strong beat and a weak beat. I have a slight headache or throbbing in my head. Please advise.    Heart rate 38-45 on pulse ox machine. He does feel it manually (does not count) and can tell it is slow. Does feel weak/lightheaded when heart rate low. This is happening at least every other day. Blood pressure it the morning ok but does go down lower in the afternoon. The afternoon readings have all been systolic over 759 except twice down in the 80's. Patient states he does feel lightheaded when blood pressure that low. -Will forward to  Dr. Mare Ferrari for review   Discussed with  Dr. Mare Ferrari and will have patient decrease Metoprolol 25 mg 1/2 tablet daily. Advised patient, verbalized understanding. Will call back if no better

## 2013-12-12 ENCOUNTER — Telehealth: Payer: Self-pay | Admitting: Cardiology

## 2013-12-12 DIAGNOSIS — R001 Bradycardia, unspecified: Secondary | ICD-10-CM

## 2013-12-12 DIAGNOSIS — R002 Palpitations: Secondary | ICD-10-CM

## 2013-12-12 DIAGNOSIS — R42 Dizziness and giddiness: Secondary | ICD-10-CM

## 2013-12-12 NOTE — Telephone Encounter (Signed)
Pt states, his heart rate is doing the same thing as the last time. The pulse oximetry shows the rate starts in the 30's then goes up to the 70's and back down , when he thakes his BP the rate is in the 90's. Pt states the last time the Metoprolol medication was decreased 25 mg to take 1/2 tablet daily it worked for 5 days only. Now the heart rate is doing the same thing as before, pt  also C/O of  having a slight headache.

## 2013-12-12 NOTE — Telephone Encounter (Signed)
Pt is aware of Md's recommendations. Pt will decreased the dose of Lopressor to 6.25 mg once a day, and that he should come for a 48 hours Holter monitor. Pt do not want for me to call a prescription for the lower dose of Metoprolol, because he said will try to cut the pill in 1/4 because he  Has lots of lopressor pills. An order was placed for 48 hours Holter monitor. Pt is aware that the scheduler will call him when to come to the office for placement. Pt verbalized understanding.

## 2013-12-12 NOTE — Telephone Encounter (Signed)
Attempted to call pt x 3 phone sound busy.

## 2013-12-12 NOTE — Telephone Encounter (Signed)
New message     Pt is having trouble with his pulse.  It is "all over the place".  Please advise

## 2013-12-12 NOTE — Telephone Encounter (Signed)
I am not sure I believe the oximeter heart rate readings, since the BP cuff readings show normal heart rate. He should come in for a 48 hour holter monitor to find out what is going on. Try reducing lopressor further to 6.25 mg daily in the meantime.

## 2013-12-13 NOTE — Telephone Encounter (Signed)
Monitor scheduled for 4/17

## 2013-12-14 ENCOUNTER — Encounter: Payer: Self-pay | Admitting: Radiology

## 2013-12-14 ENCOUNTER — Encounter (INDEPENDENT_AMBULATORY_CARE_PROVIDER_SITE_OTHER): Payer: Medicare Other

## 2013-12-14 DIAGNOSIS — R002 Palpitations: Secondary | ICD-10-CM

## 2013-12-14 DIAGNOSIS — R42 Dizziness and giddiness: Secondary | ICD-10-CM

## 2013-12-14 DIAGNOSIS — R001 Bradycardia, unspecified: Secondary | ICD-10-CM

## 2013-12-14 NOTE — Progress Notes (Signed)
Patient ID: [name], male   DOB: 09-29-1924, 80 y.o.   MRN: [id number] E cardio 24hr holter applied

## 2013-12-20 ENCOUNTER — Telehealth: Payer: Self-pay | Admitting: *Deleted

## 2013-12-20 NOTE — Telephone Encounter (Signed)
Dr. Sherryl Barters interpretation of first 24 hours:  Sinus brady with occasional PVC's  Heart rate 55-78 Interpretation for second 24 hours:  Sinus brady with occasional junctional rhythm  Heart rate 47-93  Advised patient, continue same dose of medications

## 2014-01-17 ENCOUNTER — Telehealth: Payer: Self-pay | Admitting: *Deleted

## 2014-01-17 DIAGNOSIS — G4700 Insomnia, unspecified: Secondary | ICD-10-CM

## 2014-01-17 MED ORDER — TEMAZEPAM 15 MG PO CAPS: ORAL_CAPSULE | ORAL | Status: DC

## 2014-01-17 NOTE — Telephone Encounter (Signed)
Patient requests temazepam refill be called in to [facility]. Thanks, MI

## 2014-01-31 ENCOUNTER — Other Ambulatory Visit (INDEPENDENT_AMBULATORY_CARE_PROVIDER_SITE_OTHER): Payer: Medicare Other

## 2014-01-31 DIAGNOSIS — I2510 Atherosclerotic heart disease of native coronary artery without angina pectoris: Secondary | ICD-10-CM

## 2014-01-31 DIAGNOSIS — I447 Left bundle-branch block, unspecified: Secondary | ICD-10-CM

## 2014-01-31 DIAGNOSIS — E785 Hyperlipidemia, unspecified: Secondary | ICD-10-CM

## 2014-01-31 LAB — CBC WITH DIFFERENTIAL/PLATELET
BASOS ABS: 0 10*3/uL (ref 0.0–0.1)
Basophils Relative: 0.7 % (ref 0.0–3.0)
EOS ABS: 0.2 10*3/uL (ref 0.0–0.7)
Eosinophils Relative: 2.6 % (ref 0.0–5.0)
HCT: 39.8 % (ref 39.0–52.0)
Hemoglobin: 13.2 g/dL (ref 13.0–17.0)
Lymphocytes Relative: 37.5 % (ref 12.0–46.0)
Lymphs Abs: 2.3 10*3/uL (ref 0.7–4.0)
MCHC: 33.2 g/dL (ref 30.0–36.0)
MCV: 99 fl (ref 78.0–100.0)
Monocytes Absolute: 0.4 10*3/uL (ref 0.1–1.0)
Monocytes Relative: 7.4 % (ref 3.0–12.0)
NEUTROS PCT: 51.8 % (ref 43.0–77.0)
Neutro Abs: 3.2 10*3/uL (ref 1.4–7.7)
Platelets: 268 10*3/uL (ref 150.0–400.0)
RBC: 4.02 Mil/uL — AB (ref 4.22–5.81)
RDW: 14.1 % (ref 11.5–15.5)
WBC: 6.1 10*3/uL (ref 4.0–10.5)

## 2014-01-31 LAB — LIPID PANEL
CHOLESTEROL: 182 mg/dL (ref 0–200)
HDL: 62.5 mg/dL (ref >39.00)
LDL Cholesterol: 104 mg/dL — ABNORMAL HIGH (ref 0–99)
NonHDL: 119.5
Total CHOL/HDL Ratio: 3
Triglycerides: 79 mg/dL (ref 0.0–149.0)
VLDL: 15.8 mg/dL (ref 0.0–40.0)

## 2014-01-31 LAB — BASIC METABOLIC PANEL
BUN: 14 mg/dL (ref 6–23)
CHLORIDE: 103 meq/L (ref 96–112)
CO2: 29 meq/L (ref 19–32)
CREATININE: 0.9 mg/dL (ref 0.4–1.5)
Calcium: 9.8 mg/dL (ref 8.4–10.5)
GFR: 83.78 mL/min (ref >60.00)
GLUCOSE: 88 mg/dL (ref 70–99)
Potassium: 4.3 mEq/L (ref 3.5–5.1)
Sodium: 136 mEq/L (ref 135–145)

## 2014-01-31 LAB — HEPATIC FUNCTION PANEL
ALBUMIN: 3.7 g/dL (ref 3.5–5.2)
ALT: 22 U/L (ref 0–53)
AST: 30 U/L (ref 0–37)
Alkaline Phosphatase: 94 U/L (ref 39–117)
BILIRUBIN DIRECT: 0.1 mg/dL (ref 0.0–0.3)
TOTAL PROTEIN: 6.5 g/dL (ref 6.0–8.3)
Total Bilirubin: 0.4 mg/dL (ref 0.2–1.2)

## 2014-01-31 NOTE — Progress Notes (Signed)
Quick Note:  Please make copy of labs for patient visit. ______ 

## 2014-02-07 ENCOUNTER — Ambulatory Visit (INDEPENDENT_AMBULATORY_CARE_PROVIDER_SITE_OTHER): Payer: Medicare Other | Admitting: Cardiology

## 2014-02-07 ENCOUNTER — Encounter: Payer: Self-pay | Admitting: Cardiology

## 2014-02-07 VITALS — BP 138/68 | HR 60 | Ht 70.0 in | Wt 158.0 lb

## 2014-02-07 DIAGNOSIS — S72141A Displaced intertrochanteric fracture of right femur, initial encounter for closed fracture: Secondary | ICD-10-CM

## 2014-02-07 DIAGNOSIS — I447 Left bundle-branch block, unspecified: Secondary | ICD-10-CM

## 2014-02-07 DIAGNOSIS — R5383 Other fatigue: Secondary | ICD-10-CM

## 2014-02-07 DIAGNOSIS — R531 Weakness: Secondary | ICD-10-CM

## 2014-02-07 DIAGNOSIS — I259 Chronic ischemic heart disease, unspecified: Secondary | ICD-10-CM

## 2014-02-07 DIAGNOSIS — S72143A Displaced intertrochanteric fracture of unspecified femur, initial encounter for closed fracture: Secondary | ICD-10-CM

## 2014-02-07 DIAGNOSIS — E785 Hyperlipidemia, unspecified: Secondary | ICD-10-CM

## 2014-02-07 DIAGNOSIS — R5381 Other malaise: Secondary | ICD-10-CM

## 2014-02-07 DIAGNOSIS — I10 Essential (primary) hypertension: Secondary | ICD-10-CM

## 2014-02-07 NOTE — Assessment & Plan Note (Signed)
Blood pressure has been stable.  In the past his blood pressure has been too low.  He has been having symptomatic palpitations intermittently.  He describes what sounds like bigeminy PVCs.  Now that his blood pressure can tolerate a higher dose of metoprolol we will increase his metoprolol to 12.5 mg daily instead of 6.25 mg daily.

## 2014-02-07 NOTE — Patient Instructions (Signed)
INCREASE YOUR METOPROLOL TO 1/2 TABLET DAILY  Your physician recommends that you schedule a follow-up appointment in: 3 month ov/ekg

## 2014-02-07 NOTE — Assessment & Plan Note (Signed)
The patient has known left bundle branch block.  He has not been having any dizzy spells or syncope

## 2014-02-07 NOTE — Assessment & Plan Note (Signed)
The patient has not had to take any recent sublingual nitroglycerin.  He has not been having any recent chest pain or

## 2014-02-07 NOTE — Assessment & Plan Note (Signed)
The patient is status post intertrochanteric fracture right hip.  His hemoglobin is back to normal.  He continues to have a lot of discomfort in his right knee.  He has been using some topical counter area and is such as Voltaren gel although that is quite expensive and not covered by the [facility].  He has also been taking topical capsaicin which has helped as much as Voltaren and is less expensive.

## 2014-02-07 NOTE — Progress Notes (Signed)
[name] Date of Birth:  1924/09/06 [name] [address] [place], [place]  [address] [phone]        Fax   [phone]   History of Present Illness: This pleasant 80 year old gentleman is seen back for a scheduled 3 month followup office visit.  He has a history of extensive ischemic heart disease. He had coronary artery bypass graft surgery in 1986. He had a redo CABG in 1993. He had PTCA with stent in October 2008. He had catheter with a drug-eluting stent in February 2010. He had a small non-Q MI in August 2010. His last nuclear stress test was 10/27/09 showing a moderate area of reversible ischemia in the inferolateral wall with an ejection fraction of 64%.  He presented to the emergency room on 07/15/12 after falling at home. While in the emergency room he developed chest pain followed by ventricular tachycardia requiring cardioversion. He was taken emergently to the catheter lab treated with a bare-metal stent to the mid LAD was treated initially with IV amiodarone which was subsequently stopped prior to discharge. His echocardiogram November 2013 showed ejection fraction 40-45% and moderate left atrial enlargement.  He has been treated by urologist Dr. [name] for right hydronephrosis secondary to renal calculus in the right ureter.  Since last visit he was hospitalized on 08/28/13 for acute right hip fracture suffered in a mechanical fall. He was getting out of the limousine following his sister-in-law's funeral when his knee buckled and gave out and he fell to the ground. Dr. [name] is his orthopedist. He tolerated the surgery okay from a cardiac standpoint with general anesthesia. He states he did receive 2 units of packed cells in the hospital for severe anemia.  His recent CBC shows improvement in hemoglobin back toward normal.   Current Outpatient Prescriptions  Medication Sig Dispense Refill  . acetaminophen (TYLENOL) 500 MG tablet Take 500 mg by  mouth 2 (two) times daily. Once in the am Once in the pm      . aspirin 81 MG tablet Take 81 mg by mouth daily.   30 tablet  6  . atorvastatin (LIPITOR) 80 MG tablet Take 80 mg by mouth every evening.   30 tablet  6  . budesonide-formoterol (SYMBICORT) 80-4.5 MCG/ACT inhaler Inhale 2 puffs into the lungs 2 (two) times daily.      . carbamazepine (CARBATROL) 300 MG 12 hr capsule Take 300 mg by mouth 2 (two) times daily.       . clopidogrel (PLAVIX) 75 MG tablet Take 75 mg by mouth daily.        Mariane Baumgarten Sodium (DSS) 100 MG CAPS Take 100 mg by mouth 2 (two) times daily.      . ferrous gluconate (FERGON) 324 MG tablet Take 1 tablet (324 mg total) by mouth daily with breakfast.  60 tablet  2  . finasteride (PROSCAR) 5 MG tablet Take 5 mg by mouth daily.       . furosemide (LASIX) 40 MG tablet Take 40 mg by mouth every morning.      [name] ipratropium (ATROVENT HFA) 17 MCG/ACT inhaler Inhale 2 puffs into the lungs 2 (two) times daily as needed for wheezing.       . isosorbide mononitrate (IMDUR) 60 MG 24 hr tablet Take 90 mg by mouth daily.      . methocarbamol (ROBAXIN) 500 MG tablet Take 1 tablet (500 mg total) by mouth every 6 (six) hours as needed  for muscle spasms.  30 tablet  0  . metoprolol succinate (TOPROL-XL) 25 MG 24 hr tablet 1/2  tablet daily      . NITROSTAT 0.4 MG SL tablet PLACE 1 TABLET UNDER THE TONGUE EVERY 5 MINUTES ASDIRECTED BY PHYSICIAN  25 tablet  11  . oxyCODONE (OXY IR/ROXICODONE) 5 MG immediate release tablet Take one tablet by mouth every 4 hours as needed for mild pain; Take two tablets by mouth every 4 hours s needed for moderate pain; Take three tablets by mouth every 4 hours as needed for severe pain  360 tablet  0  . polyethylene glycol (MIRALAX / GLYCOLAX) packet Take 17 g by mouth daily as needed for mild constipation.  14 each  2  . potassium chloride SA (K-DUR,KLOR-CON) 20 MEQ tablet Take 20 mEq by mouth daily.       . ranitidine (ZANTAC) 150 MG tablet Take 1 tablet  (150 mg total) by mouth 2 (two) times daily.  90 tablet  3  . senna (SENOKOT) 8.6 MG TABS tablet Take 1 tablet (8.6 mg total) by mouth 2 (two) times daily as needed for mild constipation.  60 each  2  . sertraline (ZOLOFT) 100 MG tablet Take 150 mg by mouth every morning.       . temazepam (RESTORIL) 15 MG capsule Take one capsule by mouth every night at bedtime as needed for sleep  30 capsule  5   No current facility-administered medications for this visit.    No Known Allergies  Patient Active Problem List   Diagnosis Date Noted  . Dizziness     Priority: High  . HYPERLIPIDEMIA 01/05/2007    Priority: Medium  . Cor Athrscl-Uns Vessel 01/05/2007    Priority: Medium  . Systolic CHF 13/03/6577  . Depression 09/26/2013  . Orthostatic hypotension 09/26/2013  . Acute posthemorrhagic anemia 09/07/2013  . Intertrochanteric fracture of right hip 08/29/2013  . Hip fracture 08/29/2013  . Hypertension   . GERD (gastroesophageal reflux disease)   . COPD (chronic obstructive pulmonary disease)   . H/O cardiac arrest   . Malaise and fatigue 05/09/2013  . Junctional bradycardia 07/25/2012  . Glucose intolerance (impaired glucose tolerance) 07/19/2012  . Normocytic anemia 07/19/2012  . SIRS (systemic inflammatory response syndrome) 07/17/2012  . Bacteremia due to Escherichia coli 07/17/2012  . Acute MI, anterolateral wall 07/16/2012  . Laceration of head 07/16/2012  . Prostatitis 07/16/2012  . E. coli UTI (urinary tract infection) 07/16/2012  . Elevated transaminase level 07/16/2012  . Foot drop, right 07/28/2011  . LBBB (left bundle branch block) 03/29/2011  . Hx of CABG 03/29/2011  . Ischemic heart disease   . Myocardial infarction   . Acute systolic congestive heart failure, NYHA class 2-EF 40-45%   . Chronic respiratory failure   . Chronic asthma   . Peripheral neuropathy   . Dyslipidemia (high LDL; low HDL)   . PERIPHERAL NEUROPATHY 04/04/2007  . HYPERTENSION 04/04/2007  .  PSORIASIS 04/04/2007  . OSTEOARTHRITIS 04/04/2007  . OSTEOPOROSIS 04/04/2007  . PROSTATE CANCER, HX OF 04/04/2007  . GERD 01/05/2007  . HERNIA 01/05/2007    History  Smoking status  . Never Smoker   Smokeless tobacco  . Never Used    History  Alcohol Use No    Family History  Problem Relation Age of Onset  . Heart attack Mother   . Addison's disease Father   . Heart attack Sister   . Anesthesia problems Neg Hx   .  Hypotension Neg Hx   . Malignant hyperthermia Neg Hx   . Pseudochol deficiency Neg Hx     Review of Systems: Constitutional: no fever chills diaphoresis or fatigue or change in weight.  Head and neck: no hearing loss, no epistaxis, no photophobia or visual disturbance. Respiratory: No cough, shortness of breath or wheezing. Cardiovascular: No chest pain peripheral edema, palpitations. Gastrointestinal: No abdominal distention, no abdominal pain, no change in bowel habits hematochezia or melena. Genitourinary: No dysuria, no frequency, no urgency, no nocturia. Musculoskeletal:No arthralgias, no back pain, no gait disturbance or myalgias. Neurological: No dizziness, no headaches, no numbness, no seizures, no syncope, no weakness, no tremors. Hematologic: No lymphadenopathy, no easy bruising. Psychiatric: No confusion, no hallucinations, no sleep disturbance.    Physical Exam: Filed Vitals:   02/07/14 0928  BP: 138/68  Pulse: 60   the general appearance reveals a well-developed elderly gentleman in no acute distress.The head and neck exam reveals pupils equal and reactive.  Extraocular movements are full.  There is no scleral icterus.  The mouth and pharynx are normal.  The neck is supple.  The carotids reveal no bruits.  The jugular venous pressure is normal.  The  thyroid is not enlarged.  There is no lymphadenopathy.  The chest is clear to percussion and auscultation.  There are no rales or rhonchi.  Expansion of the chest is symmetrical.  The precordium is  quiet.  The first heart sound is normal.  The second heart sound is physiologically split.  There is no murmur gallop rub or click.  There is no abnormal lift or heave.  The abdomen is soft and nontender.  The bowel sounds are normal.  The liver and spleen are not enlarged.  There are no abdominal masses.  There are no abdominal bruits.  Extremities reveal good pedal pulses.  There is no phlebitis or edema.  There is no cyanosis or clubbing.  Strength reveals chronic right foot drop and he wears a posterior brace on the right foot to prevent foot drop. There is no lateralizing weakness.  There are no sensory deficits.  The skin is warm and dry.  There is no rash.  EKG today shows normal sinus rhythm with right axis deviation and left bundle branch block and is unchanged from 10/02/13   Assessment / Plan: 1.  Ischemic heart disease status post CABG in 1986, redo CABG in 1993, PTCA with stent in 2008, drug-eluting stent in 2010, bare-metal stent to the mid LAD in November 2013. 2. left bundle-branch block 3. ischemic cardiomyopathy with ejection fraction of 40-45% by echocardiogram November 2013. 4. Status post acute right hip fracture secondary to a mechanical fall in December 2014. 5. osteoarthritis with painful right knee 6. history of renal calculus with right hydronephrosis followed by Dr. [name] 7. Dyslipidemia 8. orthostatic hypotension  Plan: We will try increasing his Toprol-XL to 12.5 mg daily Lab work was reviewed with him and is satisfactory.  Recheck recheck in 3 months for office visit and EKG

## 2014-02-08 ENCOUNTER — Encounter: Payer: Self-pay | Admitting: Cardiology

## 2014-04-09 ENCOUNTER — Other Ambulatory Visit: Payer: Self-pay | Admitting: Cardiology

## 2014-04-15 ENCOUNTER — Encounter: Payer: Self-pay | Admitting: Family Medicine

## 2014-04-15 ENCOUNTER — Ambulatory Visit (INDEPENDENT_AMBULATORY_CARE_PROVIDER_SITE_OTHER): Payer: Medicare Other | Admitting: Family Medicine

## 2014-04-15 VITALS — BP 121/55 | HR 59 | Temp 97.8°F | Wt 157.0 lb

## 2014-04-15 DIAGNOSIS — Z7189 Other specified counseling: Secondary | ICD-10-CM

## 2014-04-15 DIAGNOSIS — Z7689 Persons encountering health services in other specified circumstances: Secondary | ICD-10-CM

## 2014-04-15 NOTE — Progress Notes (Signed)
Patient ID: [name], male   DOB: May 30, 1925, 80 y.o.   MRN: [id number]   [facility] Aquilla Hacker, MD Phone: [phone]  Subjective:   # Pt. Here to establish care.  - Only complaint is infrequent skipped heart beats. He denies loss of consciousness, dizziness, or chest pain during these episodes.  - Extensive cardiac history reviewed. Pt. With no complaints at this time. He feels very well, and has no ongoing symptoms.  - COPD history reviewed. Pt. States that he has no complaints, and that he is at his baseline int terms of respiratory status. Taking daily inhaler for long term control. - History of Anemia reviewed. Pt. Requesting CBC for evaluation, though previous evaluation 02/05/2012 with essentially low -normal CBC.  - Family history of DMII discussed. Patient would like close follow up of blood glucose to ensure appropriate management if indicated. No polydypsia, polyuria, vision changes, or peripheral numbness noted at this time.  - Pt. With nonhealing ulcerative 1cmx1cm spot on L arm. He says that it has been there approximately 6-7 months. He says that it itches frequently, and periodically will bleed. He denies weight loss, night sweats, or other red flag signs / symptoms at this time.  - Additionally, the patient has a stye of his L lower eyelid that has been unresolved with    All relevant systems were reviewed and were negative unless otherwise noted in the HPI  Past Medical History Patient Active Problem List   Diagnosis Date Noted  . Systolic CHF 84/16/6063  . Depression 09/26/2013  . Orthostatic hypotension 09/26/2013  . Acute posthemorrhagic anemia 09/07/2013  . Intertrochanteric fracture of right hip 08/29/2013  . Hip fracture 08/29/2013  . Hypertension   . GERD (gastroesophageal reflux disease)   . COPD (chronic obstructive pulmonary disease)   . H/O cardiac arrest   . Malaise and fatigue 05/09/2013  . Junctional bradycardia  07/25/2012  . Glucose intolerance (impaired glucose tolerance) 07/19/2012  . Normocytic anemia 07/19/2012  . SIRS (systemic inflammatory response syndrome) 07/17/2012  . Bacteremia due to Escherichia coli 07/17/2012  . Acute MI, anterolateral wall 07/16/2012  . Laceration of head 07/16/2012  . Prostatitis 07/16/2012  . E. coli UTI (urinary tract infection) 07/16/2012  . Elevated transaminase level 07/16/2012  . Foot drop, right 07/28/2011  . LBBB (left bundle branch block) 03/29/2011  . Hx of CABG 03/29/2011  . Ischemic heart disease   . Myocardial infarction   . Acute systolic congestive heart failure, NYHA class 2-EF 40-45%   . Chronic respiratory failure   . Chronic asthma   . Peripheral neuropathy   . Dizziness   . Dyslipidemia (high LDL; low HDL)   . PERIPHERAL NEUROPATHY 04/04/2007  . HYPERTENSION 04/04/2007  . PSORIASIS 04/04/2007  . OSTEOARTHRITIS 04/04/2007  . OSTEOPOROSIS 04/04/2007  . PROSTATE CANCER, HX OF 04/04/2007  . HYPERLIPIDEMIA 01/05/2007  . Cor Athrscl-Uns Vessel 01/05/2007  . GERD 01/05/2007  . HERNIA 01/05/2007   Reviewed problem list.  Medications- reviewed and updated Chief complaint-noted No additions to family history Social history- patient is a Former smoker  Objective: BP 121/55  Pulse 59  Temp(Src) 97.8 F (36.6 C) (Oral)  Wt 157 lb (71.215 kg) Gen: NAD, alert, cooperative with exam HEENT: NCAT, EOMI, PERRL, TMs nml Neck: FROM, supple CV: RRR, good S1/S2, no murmur, cap refill <3 Resp: CTABL, no wheezes, non-labored Abd: SNTND, BS present, no guarding or organomegaly Ext: No edema, warm, normal tone, moves UE/LE spontaneously Neuro:  Alert and oriented, No gross deficits Skin: no rashes no lesions  Assessment/Plan: See problem based a/p  - At this time Mr. Liford is doing very well on all of his current therapies. Will not change anything given the tenuous nature of his medical problems. He has no complaints. Pt. Requested CBC and  POCT Glucose, however upon review of his records he has normal values for these tests within the previous two months, and he is asymptomatic. After review with the patient he agreed that we would forego these tests today and will order in the future if necessity should arise.  - Follow up in 6 months.

## 2014-04-15 NOTE — Patient Instructions (Signed)
It was great to meet you today! I am glad that you are doing so well. We will plan to continue all of your current medications as you are taking them now. We will see you back in 5 months for another check up.  1. Please remember to bring up the lesion on your left arm to the Dermatologist 2. Please remember to bring up the stye in your left eye to the Ophthalmologist.   Take care, and I look forward to seeing you next time. Blessings, Jannine Abreu G. Marieke Lubke, MD   . Heart Failure Heart failure is a condition in which the heart has trouble pumping blood. This means your heart does not pump blood efficiently for your body to work well. In some cases of heart failure, fluid may back up into your lungs or you may have swelling (edema) in your lower legs. Heart failure is usually a long-term (chronic) condition. It is important for you to take good care of yourself and follow your health care provider's treatment plan. CAUSES  Some health conditions can cause heart failure. Those health conditions include:  High blood pressure (hypertension). Hypertension causes the heart muscle to work harder than normal. When pressure in the blood vessels is high, the heart needs to pump (contract) with more force in order to circulate blood throughout the body. High blood pressure eventually causes the heart to become stiff and weak.  Coronary artery disease (CAD). CAD is the buildup of cholesterol and fat (plaque) in the arteries of the heart. The blockage in the arteries deprives the heart muscle of oxygen and blood. This can cause chest pain and may lead to a heart attack. High blood pressure can also contribute to CAD.  Heart attack (myocardial infarction). A heart attack occurs when one or more arteries in the heart become blocked. The loss of oxygen damages the muscle tissue of the heart. When this happens, part of the heart muscle dies. The injured tissue does not contract as well and weakens the heart's ability to  pump blood.  Abnormal heart valves. When the heart valves do not open and close properly, it can cause heart failure. This makes the heart muscle pump harder to keep the blood flowing.  Heart muscle disease (cardiomyopathy or myocarditis). Heart muscle disease is damage to the heart muscle from a variety of causes. These can include drug or alcohol abuse, infections, or unknown reasons. These can increase the risk of heart failure.  Lung disease. Lung disease makes the heart work harder because the lungs do not work properly. This can cause a strain on the heart, leading it to fail.  Diabetes. Diabetes increases the risk of heart failure. High blood sugar contributes to high fat (lipid) levels in the blood. Diabetes can also cause slow damage to tiny blood vessels that carry important nutrients to the heart muscle. When the heart does not get enough oxygen and food, it can cause the heart to become weak and stiff. This leads to a heart that does not contract efficiently.  Other conditions can contribute to heart failure. These include abnormal heart rhythms, thyroid problems, and low blood counts (anemia). Certain unhealthy behaviors can increase the risk of heart failure, including:  Being overweight.  Smoking or chewing tobacco.  Eating foods high in fat and cholesterol.  Abusing illicit drugs or alcohol.  Lacking physical activity. SYMPTOMS  Heart failure symptoms may vary and can be hard to detect. Symptoms may include:  Shortness of breath with activity, such as  climbing stairs.  Persistent cough.  Swelling of the feet, ankles, legs, or abdomen.  Unexplained weight gain.  Difficulty breathing when lying flat (orthopnea).  Waking from sleep because of the need to sit up and get more air.  Rapid heartbeat.  Fatigue and loss of energy.  Feeling light-headed, dizzy, or close to fainting.  Loss of appetite.  Nausea.  Increased urination during the night  (nocturia). DIAGNOSIS  A diagnosis of heart failure is based on your history, symptoms, physical examination, and diagnostic tests. Diagnostic tests for heart failure may include:  Echocardiography.  Electrocardiography.  Chest X-ray.  Blood tests.  Exercise stress test.  Cardiac angiography.  Radionuclide scans. TREATMENT  Treatment is aimed at managing the symptoms of heart failure. Medicines, behavioral changes, or surgical intervention may be necessary to treat heart failure.  Medicines to help treat heart failure may include:  Angiotensin-converting enzyme (ACE) inhibitors. This type of medicine blocks the effects of a blood protein called angiotensin-converting enzyme. ACE inhibitors relax (dilate) the blood vessels and help lower blood pressure.  Angiotensin receptor blockers (ARBs). This type of medicine blocks the actions of a blood protein called angiotensin. Angiotensin receptor blockers dilate the blood vessels and help lower blood pressure.  Water pills (diuretics). Diuretics cause the kidneys to remove salt and water from the blood. The extra fluid is removed through urination. This loss of extra fluid lowers the volume of blood the heart pumps.  Beta blockers. These prevent the heart from beating too fast and improve heart muscle strength.  Digitalis. This increases the force of the heartbeat.  Healthy behavior changes include:  Obtaining and maintaining a healthy weight.  Stopping smoking or chewing tobacco.  Eating heart-healthy foods.  Limiting or avoiding alcohol.  Stopping illicit drug use.  Physical activity as directed by your health care provider.  Surgical treatment for heart failure may include:  A procedure to open blocked arteries, repair damaged heart valves, or remove damaged heart muscle tissue.  A pacemaker to improve heart muscle function and control certain abnormal heart rhythms.  An internal cardioverter defibrillator to treat  certain serious abnormal heart rhythms.  A left ventricular assist device (LVAD) to assist the pumping ability of the heart. HOME CARE INSTRUCTIONS   Take medicines only as directed by your health care provider. Medicines are important in reducing the workload of your heart, slowing the progression of heart failure, and improving your symptoms.  Do not stop taking your medicine unless directed by your health care provider.  Do not skip any dose of medicine.  Refill your prescriptions before you run out of medicine. Your medicines are needed every day.  Engage in moderate physical activity if directed by your health care provider. Moderate physical activity can benefit some people. The elderly and people with severe heart failure should consult with a health care provider for physical activity recommendations.  Eat heart-healthy foods. Food choices should be free of trans fat and low in saturated fat, cholesterol, and salt (sodium). Healthy choices include fresh or frozen fruits and vegetables, fish, lean meats, legumes, fat-free or low-fat dairy products, and whole grain or high fiber foods. Talk to a dietitian to learn more about heart-healthy foods.  Limit sodium if directed by your health care provider. Sodium restriction may reduce symptoms of heart failure in some people. Talk to a dietitian to learn more about heart-healthy seasonings.  Use healthy cooking methods. Healthy cooking methods include roasting, grilling, broiling, baking, poaching, steaming, or stir-frying. Talk to a  dietitian to learn more about healthy cooking methods.  Limit fluids if directed by your health care provider. Fluid restriction may reduce symptoms of heart failure in some people.  Weigh yourself every day. Daily weights are important in the early recognition of excess fluid. You should weigh yourself every morning after you urinate and before you eat breakfast. Wear the same amount of clothing each time you  weigh yourself. Record your daily weight. Provide your health care provider with your weight record.  Monitor and record your blood pressure if directed by your health care provider.  Check your pulse if directed by your health care provider.  Lose weight if directed by your health care provider. Weight loss may reduce symptoms of heart failure in some people.  Stop smoking or chewing tobacco. Nicotine makes your heart work harder by causing your blood vessels to constrict. Do not use nicotine gum or patches before talking to your health care provider.  Keep all follow-up visits as directed by your health care provider. This is important.  Limit alcohol intake to no more than 1 drink per day for nonpregnant women and 2 drinks per day for men. One drink equals 12 ounces of beer, 5 ounces of wine, or 1 ounces of hard liquor. Drinking more than that is harmful to your heart. Tell your health care provider if you drink alcohol several times a week. Talk with your health care provider about whether alcohol is safe for you. If your heart has already been damaged by alcohol or you have severe heart failure, drinking alcohol should be stopped completely.  Stop illicit drug use.  Stay up-to-date with immunizations. It is especially important to prevent respiratory infections through current pneumococcal and influenza immunizations.  Manage other health conditions such as hypertension, diabetes, thyroid disease, or abnormal heart rhythms as directed by your health care provider.  Learn to manage stress.  Plan rest periods when fatigued.  Learn strategies to manage high temperatures. If the weather is extremely hot:  Avoid vigorous physical activity.  Use air conditioning or fans or seek a cooler location.  Avoid caffeine and alcohol.  Wear loose-fitting, lightweight, and light-colored clothing.  Learn strategies to manage cold temperatures. If the weather is extremely cold:  Avoid vigorous  physical activity.  Layer clothes.  Wear mittens or gloves, a hat, and a scarf when going outside.  Avoid alcohol.  Obtain ongoing education and support as needed.  Participate in or seek rehabilitation as needed to maintain or improve independence and quality of life. SEEK MEDICAL CARE IF:   Your weight increases by 03 lb/1.4 kg in 1 day or 05 lb/2.3 kg in a week.  You have increasing shortness of breath that is unusual for you.  You are unable to participate in your usual physical activities.  You tire easily.  You cough more than normal, especially with physical activity.  You have any or more swelling in areas such as your hands, feet, ankles, or abdomen.  You are unable to sleep because it is hard to breathe.  You feel like your heart is beating fast (palpitations).  You become dizzy or light-headed upon standing up. SEEK IMMEDIATE MEDICAL CARE IF:   You have difficulty breathing.  There is a change in mental status such as decreased alertness or difficulty with concentration.  You have a pain or discomfort in your chest.  You have an episode of fainting (syncope). MAKE SURE YOU:   Understand these instructions.  Will watch your condition.  Will get help right away if you are not doing well or get worse. Document Released: 08/16/2005 Document Revised: 12/31/2013 Document Reviewed: 09/15/2012 [facility] Patient Information 2015 [place], [place]. This information is not intended to replace advice given to you by your health care provider. Make sure you discuss any questions you have with your health care provider.

## 2014-04-22 ENCOUNTER — Other Ambulatory Visit: Payer: Self-pay | Admitting: Dermatology

## 2014-05-10 ENCOUNTER — Encounter: Payer: Self-pay | Admitting: Cardiology

## 2014-05-10 ENCOUNTER — Ambulatory Visit (INDEPENDENT_AMBULATORY_CARE_PROVIDER_SITE_OTHER): Payer: Medicare Other | Admitting: Cardiology

## 2014-05-10 VITALS — BP 126/70 | HR 54 | Ht 70.0 in | Wt 159.1 lb

## 2014-05-10 DIAGNOSIS — I5021 Acute systolic (congestive) heart failure: Secondary | ICD-10-CM

## 2014-05-10 DIAGNOSIS — E785 Hyperlipidemia, unspecified: Secondary | ICD-10-CM

## 2014-05-10 DIAGNOSIS — I259 Chronic ischemic heart disease, unspecified: Secondary | ICD-10-CM

## 2014-05-10 DIAGNOSIS — D649 Anemia, unspecified: Secondary | ICD-10-CM

## 2014-05-10 DIAGNOSIS — I498 Other specified cardiac arrhythmias: Secondary | ICD-10-CM

## 2014-05-10 DIAGNOSIS — I447 Left bundle-branch block, unspecified: Secondary | ICD-10-CM

## 2014-05-10 DIAGNOSIS — R001 Bradycardia, unspecified: Secondary | ICD-10-CM

## 2014-05-10 DIAGNOSIS — I509 Heart failure, unspecified: Secondary | ICD-10-CM

## 2014-05-10 NOTE — Progress Notes (Signed)
[name] Date of Birth:  [birth year] [place] [address] [place], [place]  [address] [phone]        Fax   [phone]   History of Present Illness: This pleasant 80 year old gentleman is seen back for a scheduled 3 month followup office visit.  He has a history of extensive ischemic heart disease. He had coronary artery bypass graft surgery in 1986. He had a redo CABG in 1993. He had PTCA with stent in October 2008. He had catheter with a drug-eluting stent in February 2010. He had a small non-Q MI in August 2010. His last nuclear stress test was 10/27/09 showing a moderate area of reversible ischemia in the inferolateral wall with an ejection fraction of 64%.  He presented to the emergency room on 07/15/12 after falling at home. While in the emergency room he developed chest pain followed by ventricular tachycardia requiring cardioversion. He was taken emergently to the catheter lab treated with a bare-metal stent to the mid LAD was treated initially with IV amiodarone which was subsequently stopped prior to discharge. His echocardiogram November 2013 showed ejection fraction 40-45% and moderate left atrial enlargement.  He has been treated by urologist Dr. [name] for right hydronephrosis secondary to renal calculus in the right ureter.  Since last visit he was hospitalized on 08/28/13 for acute right hip fracture suffered in a mechanical fall. He was getting out of the limousine following his sister-in-law's funeral when his knee buckled and gave out and he fell to the ground. Dr. [name] is his orthopedist. He tolerated the surgery okay from a cardiac standpoint with general anesthesia. He states he did receive 2 units of packed cells in the hospital for severe anemia.  His recent CBC shows improvement in hemoglobin back toward normal.  He is no longer taking iron tablets   Current Outpatient Prescriptions  Medication Sig Dispense Refill  . acetaminophen  (TYLENOL) 500 MG tablet Take 500 mg by mouth 2 (two) times daily. Once in the am Once in the pm      . aspirin 81 MG tablet Take 81 mg by mouth daily.   30 tablet  6  . atorvastatin (LIPITOR) 80 MG tablet Take 80 mg by mouth every evening.   30 tablet  6  . budesonide-formoterol (SYMBICORT) 80-4.5 MCG/ACT inhaler Inhale 2 puffs into the lungs 2 (two) times daily.      . carbamazepine (CARBATROL) 300 MG 12 hr capsule Take 300 mg by mouth 2 (two) times daily.       . clopidogrel (PLAVIX) 75 MG tablet Take 75 mg by mouth daily.        [name] Sodium (DSS) 100 MG CAPS Take 100 mg by mouth 2 (two) times daily.      . finasteride (PROSCAR) 5 MG tablet Take 5 mg by mouth daily.       . furosemide (LASIX) 40 MG tablet Take 40 mg by mouth every morning.      [name] ipratropium (ATROVENT HFA) 17 MCG/ACT inhaler Inhale 2 puffs into the lungs 2 (two) times daily as needed for wheezing.       . isosorbide mononitrate (IMDUR) 60 MG 24 hr tablet Take 90 mg by mouth daily.      . metoprolol succinate (TOPROL-XL) 25 MG 24 hr tablet 1/2  tablet daily      . NITROSTAT 0.4 MG SL tablet PLACE 1 TABLET UNDER THE TONGUE EVERY 5 MINUTES ASDIRECTED BY PHYSICIAN  25 tablet  11  . potassium chloride SA (K-DUR,KLOR-CON) 20 MEQ tablet Take 20 mEq by mouth daily.       . ranitidine (ZANTAC) 150 MG tablet TAKE 1 TABLET BY MOUTH TWICE DAILY.  180 tablet  0  . sertraline (ZOLOFT) 100 MG tablet Take 150 mg by mouth every morning.       . temazepam (RESTORIL) 15 MG capsule Take one capsule by mouth every night at bedtime as needed for sleep  30 capsule  5  . ferrous gluconate (FERGON) 324 MG tablet Take 1 tablet (324 mg total) by mouth daily with breakfast.  60 tablet  2  . methocarbamol (ROBAXIN) 500 MG tablet Take 1 tablet (500 mg total) by mouth every 6 (six) hours as needed for muscle spasms.  30 tablet  0  . oxyCODONE (OXY IR/ROXICODONE) 5 MG immediate release tablet Take one tablet by mouth every 4 hours as needed for mild  pain; Take two tablets by mouth every 4 hours s needed for moderate pain; Take three tablets by mouth every 4 hours as needed for severe pain  360 tablet  0  . polyethylene glycol (MIRALAX / GLYCOLAX) packet Take 17 g by mouth daily as needed for mild constipation.  14 each  2  . senna (SENOKOT) 8.6 MG TABS tablet Take 1 tablet (8.6 mg total) by mouth 2 (two) times daily as needed for mild constipation.  60 each  2   No current facility-administered medications for this visit.    No Known Allergies  Patient Active Problem List   Diagnosis Date Noted  . Dizziness     Priority: High  . HYPERLIPIDEMIA 01/05/2007    Priority: Medium  . Cor Athrscl-Uns Vessel 01/05/2007    Priority: Medium  . Systolic CHF 97/09/6376  . Depression 09/26/2013  . Orthostatic hypotension 09/26/2013  . Acute posthemorrhagic anemia 09/07/2013  . Intertrochanteric fracture of right hip 08/29/2013  . Hip fracture 08/29/2013  . Hypertension   . GERD (gastroesophageal reflux disease)   . COPD (chronic obstructive pulmonary disease)   . H/O cardiac arrest   . Malaise and fatigue 05/09/2013  . Junctional bradycardia 07/25/2012  . Glucose intolerance (impaired glucose tolerance) 07/19/2012  . Normocytic anemia 07/19/2012  . SIRS (systemic inflammatory response syndrome) 07/17/2012  . Bacteremia due to Escherichia coli 07/17/2012  . Acute MI, anterolateral wall 07/16/2012  . Laceration of head 07/16/2012  . Prostatitis 07/16/2012  . E. coli UTI (urinary tract infection) 07/16/2012  . Elevated transaminase level 07/16/2012  . Foot drop, right 07/28/2011  . LBBB (left bundle branch block) 03/29/2011  . Hx of CABG 03/29/2011  . Ischemic heart disease   . Myocardial infarction   . Acute systolic congestive heart failure, NYHA class 2-EF 40-45%   . Chronic respiratory failure   . Chronic asthma   . Peripheral neuropathy   . Dyslipidemia (high LDL; low HDL)   . PERIPHERAL NEUROPATHY 04/04/2007  . HYPERTENSION  04/04/2007  . PSORIASIS 04/04/2007  . OSTEOARTHRITIS 04/04/2007  . OSTEOPOROSIS 04/04/2007  . PROSTATE CANCER, HX OF 04/04/2007  . GERD 01/05/2007  . HERNIA 01/05/2007    History  Smoking status  . Never Smoker   Smokeless tobacco  . Never Used    History  Alcohol Use No    Family History  Problem Relation Age of Onset  . Heart attack Mother   . Addison's disease Father   . Heart attack Sister   . Anesthesia problems Neg Hx   .  Hypotension Neg Hx   . Malignant hyperthermia Neg Hx   . Pseudochol deficiency Neg Hx     Review of Systems: Constitutional: no fever chills diaphoresis or fatigue or change in weight.  Head and neck: no hearing loss, no epistaxis, no photophobia or visual disturbance. Respiratory: No cough, shortness of breath or wheezing. Cardiovascular: No chest pain peripheral edema, palpitations. Gastrointestinal: No abdominal distention, no abdominal pain, no change in bowel habits hematochezia or melena. Genitourinary: No dysuria, no frequency, no urgency, no nocturia. Musculoskeletal:No arthralgias, no back pain, no gait disturbance or myalgias. Neurological: No dizziness, no headaches, no numbness, no seizures, no syncope, no weakness, no tremors. Hematologic: No lymphadenopathy, no easy bruising. Psychiatric: No confusion, no hallucinations, no sleep disturbance.    Physical Exam: Filed Vitals:   05/10/14 1513  BP: 126/70  Pulse: 54   the general appearance reveals a well-developed elderly gentleman in no acute distress.The head and neck exam reveals pupils equal and reactive.  Extraocular movements are full.  There is no scleral icterus.  The mouth and pharynx are normal.  The neck is supple.  The carotids reveal no bruits.  The jugular venous pressure is normal.  The  thyroid is not enlarged.  There is no lymphadenopathy.  The chest is clear to percussion and auscultation.  There are no rales or rhonchi.  Expansion of the chest is symmetrical.  The  precordium is quiet.  The first heart sound is normal.  The second heart sound is physiologically split.  There is no murmur gallop rub or click.  There is no abnormal lift or heave.  The abdomen is soft and nontender.  The bowel sounds are normal.  The liver and spleen are not enlarged.  There are no abdominal masses.  There are no abdominal bruits.  Extremities reveal good pedal pulses.  There is no phlebitis or edema.  There is no cyanosis or clubbing.  Strength reveals chronic right foot drop and he wears a posterior brace on the right foot to prevent foot drop. There is no lateralizing weakness.  There are no sensory deficits.  The skin is warm and dry.  There is no rash.  EKG today shows normal sinus rhythm with right axis deviation and left bundle branch block and is unchanged from 02/07/14   Assessment / Plan: 1.  Ischemic heart disease status post CABG in 1986, redo CABG in 1993, PTCA with stent in 2008, drug-eluting stent in 2010, bare-metal stent to the mid LAD in November 2013. 2. left bundle-branch block 3. ischemic cardiomyopathy with ejection fraction of 50-55% by echocardiogram in June 2014 4. Status post acute right hip fracture secondary to a mechanical fall in December 2014. 5. osteoarthritis with painful right knee 6. history of renal calculus with right hydronephrosis followed by Dr. [name] 7. Dyslipidemia 8. orthostatic hypotension 9.  Peripheral neuropathy, on Carbatrol from [place]: Continue current medication.  Recheck in 3 months for office visit CBC lipid panel hepatic function panel and basal metabolic panel

## 2014-05-10 NOTE — Assessment & Plan Note (Signed)
His most recent echocardiogram in June 2014 showed ejection fraction 50-55%.  He is not having symptoms of CHF.  No peripheral edema.  He is on Lasix.

## 2014-05-10 NOTE — Assessment & Plan Note (Signed)
The patient has had very little angina since last visit.  He has not had to take any nitroglycerin.  If he walks up a hill in his neighborhood he will frequently have chest discomfort relieved by rest

## 2014-05-10 NOTE — Patient Instructions (Signed)
Your physician recommends that you continue on your current medications as directed. Please refer to the Current Medication list given to you today.  Your physician recommends that you schedule a follow-up appointment in: 3 months with fasting labs (LP/BMET/HFP/CBC)  

## 2014-05-10 NOTE — Addendum Note (Signed)
Addended by: Alvina Filbert B on: 05/10/2014 04:52 PM   Modules accepted: Orders

## 2014-05-10 NOTE — Assessment & Plan Note (Signed)
The patient had a Holter monitor in April 2015.  It showed heart rates ranging from 47 up to 93.  There were occasional junctional beats.  No significant ectopy.

## 2014-07-18 ENCOUNTER — Other Ambulatory Visit: Payer: Self-pay | Admitting: *Deleted

## 2014-07-18 DIAGNOSIS — G4700 Insomnia, unspecified: Secondary | ICD-10-CM

## 2014-07-19 MED ORDER — TEMAZEPAM 15 MG PO CAPS: ORAL_CAPSULE | ORAL | Status: DC

## 2014-07-19 NOTE — Telephone Encounter (Signed)
Sent message to Dr Mare Ferrari

## 2014-07-19 NOTE — Telephone Encounter (Signed)
I spoke with Dr Mare Ferrari and he approved refilling this medication.  I phoned in Rx to the pharmacist. Pt aware.

## 2014-07-19 NOTE — Telephone Encounter (Signed)
PT OUT OF MED NEEDS CALLED IN ASAP, TEMAZEPAM, PLS CALL PT [phone] IF ANY PROBLEM , IF NOT CALL WHEN READY TO BE PICKED UP

## 2014-07-19 NOTE — Telephone Encounter (Signed)
F/u    Pt call about previous message

## 2014-08-08 ENCOUNTER — Encounter (HOSPITAL_COMMUNITY): Payer: Self-pay | Admitting: Interventional Cardiology

## 2014-08-12 ENCOUNTER — Other Ambulatory Visit (INDEPENDENT_AMBULATORY_CARE_PROVIDER_SITE_OTHER): Payer: Medicare Other | Admitting: *Deleted

## 2014-08-12 DIAGNOSIS — D649 Anemia, unspecified: Secondary | ICD-10-CM

## 2014-08-12 DIAGNOSIS — E785 Hyperlipidemia, unspecified: Secondary | ICD-10-CM

## 2014-08-12 LAB — CBC WITH DIFFERENTIAL/PLATELET
BASOS PCT: 0.8 % (ref 0.0–3.0)
Basophils Absolute: 0 10*3/uL (ref 0.0–0.1)
EOS ABS: 0.2 10*3/uL (ref 0.0–0.7)
Eosinophils Relative: 3.3 % (ref 0.0–5.0)
HCT: 37.9 % — ABNORMAL LOW (ref 39.0–52.0)
Hemoglobin: 12.5 g/dL — ABNORMAL LOW (ref 13.0–17.0)
LYMPHS PCT: 36.1 % (ref 12.0–46.0)
Lymphs Abs: 1.8 10*3/uL (ref 0.7–4.0)
MCHC: 33 g/dL (ref 30.0–36.0)
MCV: 102 fl — ABNORMAL HIGH (ref 78.0–100.0)
Monocytes Absolute: 0.4 10*3/uL (ref 0.1–1.0)
Monocytes Relative: 7.4 % (ref 3.0–12.0)
NEUTROS PCT: 52.4 % (ref 43.0–77.0)
Neutro Abs: 2.6 10*3/uL (ref 1.4–7.7)
Platelets: 270 10*3/uL (ref 150.0–400.0)
RBC: 3.71 Mil/uL — AB (ref 4.22–5.81)
RDW: 13.3 % (ref 11.5–15.5)
WBC: 5 10*3/uL (ref 4.0–10.5)

## 2014-08-12 LAB — LIPID PANEL
Cholesterol: 174 mg/dL (ref 0–200)
HDL: 63.4 mg/dL (ref >39.00)
LDL Cholesterol: 99 mg/dL (ref 0–99)
NonHDL: 110.6
Total CHOL/HDL Ratio: 3
Triglycerides: 59 mg/dL (ref 0.0–149.0)
VLDL: 11.8 mg/dL (ref 0.0–40.0)

## 2014-08-12 LAB — BASIC METABOLIC PANEL
BUN: 16 mg/dL (ref 6–23)
CALCIUM: 9.6 mg/dL (ref 8.4–10.5)
CO2: 30 mEq/L (ref 19–32)
Chloride: 104 mEq/L (ref 96–112)
Creatinine, Ser: 0.9 mg/dL (ref 0.4–1.5)
GFR: 89.32 mL/min (ref >60.00)
Glucose, Bld: 93 mg/dL (ref 70–99)
Potassium: 4 mEq/L (ref 3.5–5.1)
Sodium: 138 mEq/L (ref 135–145)

## 2014-08-12 LAB — HEPATIC FUNCTION PANEL
ALBUMIN: 3.6 g/dL (ref 3.5–5.2)
ALK PHOS: 89 U/L (ref 39–117)
ALT: 19 U/L (ref 0–53)
AST: 28 U/L (ref 0–37)
Bilirubin, Direct: 0 mg/dL (ref 0.0–0.3)
TOTAL PROTEIN: 6.6 g/dL (ref 6.0–8.3)
Total Bilirubin: 0.5 mg/dL (ref 0.2–1.2)

## 2014-08-12 NOTE — Progress Notes (Signed)
Quick Note:  Please make copy of labs for patient visit. ______ 

## 2014-08-14 ENCOUNTER — Encounter: Payer: Self-pay | Admitting: Cardiology

## 2014-08-14 ENCOUNTER — Ambulatory Visit (INDEPENDENT_AMBULATORY_CARE_PROVIDER_SITE_OTHER): Payer: Medicare Other | Admitting: Cardiology

## 2014-08-14 VITALS — BP 138/60 | HR 56 | Ht 70.0 in | Wt 161.4 lb

## 2014-08-14 DIAGNOSIS — I5021 Acute systolic (congestive) heart failure: Secondary | ICD-10-CM

## 2014-08-14 DIAGNOSIS — E785 Hyperlipidemia, unspecified: Secondary | ICD-10-CM

## 2014-08-14 DIAGNOSIS — I447 Left bundle-branch block, unspecified: Secondary | ICD-10-CM

## 2014-08-14 DIAGNOSIS — I259 Chronic ischemic heart disease, unspecified: Secondary | ICD-10-CM

## 2014-08-14 DIAGNOSIS — I10 Essential (primary) hypertension: Secondary | ICD-10-CM

## 2014-08-14 NOTE — Assessment & Plan Note (Signed)
The patient is having very little angina since last visit

## 2014-08-14 NOTE — Assessment & Plan Note (Signed)
He is not having any symptoms of acute dyspnea.

## 2014-08-14 NOTE — Patient Instructions (Signed)
Your physician recommends that you continue on your current medications as directed. Please refer to the Current Medication list given to you today.  Your physician wants you to follow-up in: 4 months with fasting labs (lp/bmet/hfp) You will receive a reminder letter in the mail two months in advance. If you don't receive a letter, please call our office to schedule the follow-up appointment.  

## 2014-08-14 NOTE — Progress Notes (Signed)
[name] Date of Birth:  April 26, 1925 [place] [address] [place], [place]  [address] [phone]        Fax   [phone]   History of Present Illness: This pleasant 80 year old gentleman is seen back for a scheduled 3 month followup office visit.  He has a history of extensive ischemic heart disease. He had coronary artery bypass graft surgery in 1986. He had a redo CABG in 1993. He had PTCA with stent in October 2008. He had catheter with a drug-eluting stent in February 2010. He had a small non-Q MI in August 2010. His last nuclear stress test was 10/27/09 showing a moderate area of reversible ischemia in the inferolateral wall with an ejection fraction of 64%.  He presented to the emergency room on 07/15/12 after falling at home. While in the emergency room he developed chest pain followed by ventricular tachycardia requiring cardioversion. He was taken emergently to the catheter lab treated with a bare-metal stent to the mid LAD was treated initially with IV amiodarone which was subsequently stopped prior to discharge. His echocardiogram November 2013 showed ejection fraction 40-45% and moderate left atrial enlargement.  He has been treated by urologist Dr. [name] for right hydronephrosis secondary to renal calculus in the right ureter.  Dr. [name] recently added doxazosin to help him with emptying his bladder . Since last visit he was hospitalized on 08/28/13 for acute right hip fracture suffered in a mechanical fall. He was getting out of the limousine following his sister-in-law's funeral when his knee buckled and gave out and he fell to the ground. Dr. [name] is his orthopedist. He tolerated the surgery okay from a cardiac standpoint with general anesthesia. He states he did receive 2 units of packed cells in the hospital for severe anemia.  His recent CBC shows improvement in hemoglobin back toward normal.  He is no longer taking iron tablets. He continues  to have a lot of discomfort in the right hip and had a recent shot in the right hip by his orthopedist   Current Outpatient Prescriptions  Medication Sig Dispense Refill  . acetaminophen (TYLENOL) 500 MG tablet Take 500 mg by mouth 2 (two) times daily. Once in the am Once in the pm    . aspirin 81 MG tablet Take 81 mg by mouth daily.  30 tablet 6  . atorvastatin (LIPITOR) 80 MG tablet Take 80 mg by mouth every evening.  30 tablet 6  . budesonide-formoterol (SYMBICORT) 80-4.5 MCG/ACT inhaler Inhale 2 puffs into the lungs 2 (two) times daily.    . carbamazepine (CARBATROL) 300 MG 12 hr capsule Take 300 mg by mouth 2 (two) times daily.     . clopidogrel (PLAVIX) 75 MG tablet Take 75 mg by mouth daily.      Mariane Baumgarten Sodium (DSS) 100 MG CAPS Take 100 mg by mouth 2 (two) times daily.    [name] doxazosin (CARDURA) 2 MG tablet Take 2 mg by mouth daily.    . finasteride (PROSCAR) 5 MG tablet Take 5 mg by mouth daily.     . furosemide (LASIX) 40 MG tablet Take 40 mg by mouth every morning.    [name] ipratropium (ATROVENT HFA) 17 MCG/ACT inhaler Inhale 2 puffs into the lungs 2 (two) times daily as needed for wheezing.     . isosorbide mononitrate (IMDUR) 60 MG 24 hr tablet Take 90 mg by mouth daily.    . metoprolol succinate (TOPROL-XL) 25 MG  24 hr tablet 1/2  tablet daily    . NITROSTAT 0.4 MG SL tablet PLACE 1 TABLET UNDER THE TONGUE EVERY 5 MINUTES ASDIRECTED BY PHYSICIAN 25 tablet 11  . potassium chloride SA (K-DUR,KLOR-CON) 20 MEQ tablet Take 20 mEq by mouth daily.     . ranitidine (ZANTAC) 150 MG tablet TAKE 1 TABLET BY MOUTH TWICE DAILY. 180 tablet 0  . sertraline (ZOLOFT) 100 MG tablet Take 150 mg by mouth every morning.     . temazepam (RESTORIL) 15 MG capsule Take one capsule by mouth every night at bedtime as needed for sleep 30 capsule 5   No current facility-administered medications for this visit.    No Known Allergies  Patient Active Problem List   Diagnosis Date Noted  . Dizziness      Priority: High  . HYPERLIPIDEMIA 01/05/2007    Priority: Medium  . Cor Athrscl-Uns Vessel 01/05/2007    Priority: Medium  . Systolic CHF 63/89/3734  . Depression 09/26/2013  . Orthostatic hypotension 09/26/2013  . Acute posthemorrhagic anemia 09/07/2013  . Intertrochanteric fracture of right hip 08/29/2013  . Hip fracture 08/29/2013  . Hypertension   . GERD (gastroesophageal reflux disease)   . COPD (chronic obstructive pulmonary disease)   . H/O cardiac arrest   . Malaise and fatigue 05/09/2013  . Junctional bradycardia 07/25/2012  . Glucose intolerance (impaired glucose tolerance) 07/19/2012  . Normocytic anemia 07/19/2012  . SIRS (systemic inflammatory response syndrome) 07/17/2012  . Bacteremia due to Escherichia coli 07/17/2012  . Acute MI, anterolateral wall 07/16/2012  . Laceration of head 07/16/2012  . Prostatitis 07/16/2012  . E. coli UTI (urinary tract infection) 07/16/2012  . Elevated transaminase level 07/16/2012  . Foot drop, right 07/28/2011  . LBBB (left bundle branch block) 03/29/2011  . Hx of CABG 03/29/2011  . Ischemic heart disease   . Myocardial infarction   . Acute systolic congestive heart failure, NYHA class 2-EF 40-45%   . Chronic respiratory failure   . Chronic asthma   . Peripheral neuropathy   . Dyslipidemia (high LDL; low HDL)   . PERIPHERAL NEUROPATHY 04/04/2007  . HYPERTENSION 04/04/2007  . PSORIASIS 04/04/2007  . OSTEOARTHRITIS 04/04/2007  . OSTEOPOROSIS 04/04/2007  . PROSTATE CANCER, HX OF 04/04/2007  . GERD 01/05/2007  . HERNIA 01/05/2007    History  Smoking status  . Never Smoker   Smokeless tobacco  . Never Used    History  Alcohol Use No    Family History  Problem Relation Age of Onset  . Heart attack Mother   . Addison's disease Father   . Heart attack Sister   . Anesthesia problems Neg Hx   . Hypotension Neg Hx   . Malignant hyperthermia Neg Hx   . Pseudochol deficiency Neg Hx     Review of  Systems: Constitutional: no fever chills diaphoresis or fatigue or change in weight.  Head and neck: no hearing loss, no epistaxis, no photophobia or visual disturbance. Respiratory: No cough, shortness of breath or wheezing. Cardiovascular: No chest pain peripheral edema, palpitations. Gastrointestinal: No abdominal distention, no abdominal pain, no change in bowel habits hematochezia or melena. Genitourinary: No dysuria, no frequency, no urgency, no nocturia. Musculoskeletal:No arthralgias, no back pain, no gait disturbance or myalgias. Neurological: No dizziness, no headaches, no numbness, no seizures, no syncope, no weakness, no tremors. Hematologic: No lymphadenopathy, no easy bruising. Psychiatric: No confusion, no hallucinations, no sleep disturbance.    Physical Exam: Filed Vitals:   08/14/14 1119  BP:  138/60  Pulse: 56   the general appearance reveals a well-developed elderly gentleman in no acute distress.The head and neck exam reveals pupils equal and reactive.  Extraocular movements are full.  There is no scleral icterus.  The mouth and pharynx are normal.  The neck is supple.  The carotids reveal no bruits.  The jugular venous pressure is normal.  The  thyroid is not enlarged.  There is no lymphadenopathy.  The chest is clear to percussion and auscultation.  There are no rales or rhonchi.  Expansion of the chest is symmetrical.  The precordium is quiet.  The first heart sound is normal.  The second heart sound is physiologically split.  There is no murmur gallop rub or click.  There is no abnormal lift or heave.  The abdomen is soft and nontender.  The bowel sounds are normal.  The liver and spleen are not enlarged.  There are no abdominal masses.  There are no abdominal bruits.  Extremities reveal good pedal pulses.  There is no phlebitis or edema.  There is no cyanosis or clubbing.  Strength reveals chronic right foot drop and he wears a posterior brace on the right foot to prevent  foot drop. There is no lateralizing weakness.  There are no sensory deficits.  The skin is warm and dry.  There is no rash.  EKG today shows normal sinus rhythm with right axis deviation and left bundle branch block and is unchanged from 02/07/14   Assessment / Plan: 1.  Ischemic heart disease status post CABG in 1986, redo CABG in 1993, PTCA with stent in 2008, drug-eluting stent in 2010, bare-metal stent to the mid LAD in November 2013. 2. left bundle-branch block 3. ischemic cardiomyopathy with ejection fraction of 50-55% by echocardiogram in June 2014 4. Status post acute right hip fracture secondary to a mechanical fall in December 2014. 5. osteoarthritis with painful right knee 6. history of renal calculus with right hydronephrosis followed by Dr. [name] 7. Dyslipidemia 8. orthostatic hypotension 9.  Peripheral neuropathy, on Carbatrol from [facility]: Continue current medication.  Recheck in 4 months for office visit  lipid panel hepatic function panel and basal metabolic panel

## 2014-08-14 NOTE — Assessment & Plan Note (Signed)
The patient's blood pressure has been stable on current therapy

## 2014-09-05 ENCOUNTER — Emergency Department (HOSPITAL_COMMUNITY): Payer: Medicare Other

## 2014-09-05 ENCOUNTER — Inpatient Hospital Stay (HOSPITAL_COMMUNITY)
Admission: EM | Admit: 2014-09-05 | Discharge: 2014-09-06 | DRG: 312 | Disposition: A | Discharge status: Home / Self Care | Payer: Medicare Other | Attending: Family Medicine | Admitting: Family Medicine

## 2014-09-05 ENCOUNTER — Encounter (HOSPITAL_COMMUNITY): Payer: Self-pay | Admitting: Emergency Medicine

## 2014-09-05 DIAGNOSIS — R079 Chest pain, unspecified: Secondary | ICD-10-CM

## 2014-09-05 DIAGNOSIS — N132 Hydronephrosis with renal and ureteral calculous obstruction: Secondary | ICD-10-CM | POA: Diagnosis present

## 2014-09-05 DIAGNOSIS — I739 Peripheral vascular disease, unspecified: Secondary | ICD-10-CM | POA: Diagnosis present

## 2014-09-05 DIAGNOSIS — I10 Essential (primary) hypertension: Secondary | ICD-10-CM | POA: Diagnosis present

## 2014-09-05 DIAGNOSIS — J449 Chronic obstructive pulmonary disease, unspecified: Secondary | ICD-10-CM | POA: Diagnosis present

## 2014-09-05 DIAGNOSIS — Z9981 Dependence on supplemental oxygen: Secondary | ICD-10-CM

## 2014-09-05 DIAGNOSIS — I2510 Atherosclerotic heart disease of native coronary artery without angina pectoris: Secondary | ICD-10-CM | POA: Diagnosis present

## 2014-09-05 DIAGNOSIS — R55 Syncope and collapse: Secondary | ICD-10-CM

## 2014-09-05 DIAGNOSIS — I472 Ventricular tachycardia: Secondary | ICD-10-CM | POA: Diagnosis present

## 2014-09-05 DIAGNOSIS — Z8546 Personal history of malignant neoplasm of prostate: Secondary | ICD-10-CM

## 2014-09-05 DIAGNOSIS — F329 Major depressive disorder, single episode, unspecified: Secondary | ICD-10-CM | POA: Diagnosis present

## 2014-09-05 DIAGNOSIS — I447 Left bundle-branch block, unspecified: Secondary | ICD-10-CM | POA: Diagnosis present

## 2014-09-05 DIAGNOSIS — D62 Acute posthemorrhagic anemia: Secondary | ICD-10-CM | POA: Diagnosis present

## 2014-09-05 DIAGNOSIS — N401 Enlarged prostate with lower urinary tract symptoms: Secondary | ICD-10-CM | POA: Diagnosis present

## 2014-09-05 DIAGNOSIS — Z951 Presence of aortocoronary bypass graft: Secondary | ICD-10-CM

## 2014-09-05 DIAGNOSIS — Z955 Presence of coronary angioplasty implant and graft: Secondary | ICD-10-CM

## 2014-09-05 DIAGNOSIS — E785 Hyperlipidemia, unspecified: Secondary | ICD-10-CM | POA: Diagnosis present

## 2014-09-05 DIAGNOSIS — G629 Polyneuropathy, unspecified: Secondary | ICD-10-CM | POA: Diagnosis present

## 2014-09-05 DIAGNOSIS — J9610 Chronic respiratory failure, unspecified whether with hypoxia or hypercapnia: Secondary | ICD-10-CM | POA: Diagnosis present

## 2014-09-05 DIAGNOSIS — I951 Orthostatic hypotension: Secondary | ICD-10-CM | POA: Diagnosis present

## 2014-09-05 DIAGNOSIS — K219 Gastro-esophageal reflux disease without esophagitis: Secondary | ICD-10-CM | POA: Diagnosis present

## 2014-09-05 DIAGNOSIS — J45909 Unspecified asthma, uncomplicated: Secondary | ICD-10-CM | POA: Diagnosis present

## 2014-09-05 DIAGNOSIS — Z7982 Long term (current) use of aspirin: Secondary | ICD-10-CM

## 2014-09-05 DIAGNOSIS — Z8674 Personal history of sudden cardiac arrest: Secondary | ICD-10-CM

## 2014-09-05 DIAGNOSIS — Z87442 Personal history of urinary calculi: Secondary | ICD-10-CM

## 2014-09-05 DIAGNOSIS — M810 Age-related osteoporosis without current pathological fracture: Secondary | ICD-10-CM | POA: Diagnosis present

## 2014-09-05 DIAGNOSIS — Z96651 Presence of right artificial knee joint: Secondary | ICD-10-CM | POA: Diagnosis present

## 2014-09-05 DIAGNOSIS — R001 Bradycardia, unspecified: Secondary | ICD-10-CM | POA: Diagnosis present

## 2014-09-05 DIAGNOSIS — I252 Old myocardial infarction: Secondary | ICD-10-CM

## 2014-09-05 DIAGNOSIS — R338 Other retention of urine: Secondary | ICD-10-CM

## 2014-09-05 DIAGNOSIS — I5042 Chronic combined systolic (congestive) and diastolic (congestive) heart failure: Secondary | ICD-10-CM | POA: Diagnosis present

## 2014-09-05 LAB — CBC WITH DIFFERENTIAL/PLATELET
BASOS ABS: 0 10*3/uL (ref 0.0–0.1)
Basophils Relative: 1 % (ref 0–1)
Eosinophils Absolute: 0.2 10*3/uL (ref 0.0–0.7)
Eosinophils Relative: 3 % (ref 0–5)
HCT: 35.9 % — ABNORMAL LOW (ref 39.0–52.0)
HEMOGLOBIN: 12.4 g/dL — AB (ref 13.0–17.0)
LYMPHS PCT: 24 % (ref 12–46)
Lymphs Abs: 1.3 10*3/uL (ref 0.7–4.0)
MCH: 34.6 pg — ABNORMAL HIGH (ref 26.0–34.0)
MCHC: 34.5 g/dL (ref 30.0–36.0)
MCV: 100.3 fL — ABNORMAL HIGH (ref 78.0–100.0)
MONOS PCT: 7 % (ref 3–12)
Monocytes Absolute: 0.4 10*3/uL (ref 0.1–1.0)
NEUTROS ABS: 3.6 10*3/uL (ref 1.7–7.7)
Neutrophils Relative %: 65 % (ref 43–77)
Platelets: 182 10*3/uL (ref 150–400)
RBC: 3.58 MIL/uL — AB (ref 4.22–5.81)
RDW: 12.4 % (ref 11.5–15.5)
WBC: 5.5 10*3/uL (ref 4.0–10.5)

## 2014-09-05 LAB — URINALYSIS, ROUTINE W REFLEX MICROSCOPIC
Bilirubin Urine: NEGATIVE
GLUCOSE, UA: NEGATIVE mg/dL
HGB URINE DIPSTICK: NEGATIVE
Ketones, ur: NEGATIVE mg/dL
Leukocytes, UA: NEGATIVE
Nitrite: NEGATIVE
PROTEIN: NEGATIVE mg/dL
Specific Gravity, Urine: 1.02 (ref 1.005–1.030)
Urobilinogen, UA: 0.2 mg/dL (ref 0.0–1.0)
pH: 6 (ref 5.0–8.0)

## 2014-09-05 LAB — BASIC METABOLIC PANEL
Anion gap: 8 (ref 5–15)
BUN: 11 mg/dL (ref 6–23)
CALCIUM: 9.5 mg/dL (ref 8.4–10.5)
CO2: 26 mmol/L (ref 19–32)
Chloride: 102 mEq/L (ref 96–112)
Creatinine, Ser: 1.05 mg/dL (ref 0.50–1.35)
GFR calc Af Amer: 72 mL/min — ABNORMAL LOW (ref >90)
GFR, EST NON AFRICAN AMERICAN: 62 mL/min — AB (ref >90)
Glucose, Bld: 167 mg/dL — ABNORMAL HIGH (ref 70–99)
Potassium: 4.5 mmol/L (ref 3.5–5.1)
Sodium: 136 mmol/L (ref 135–145)

## 2014-09-05 LAB — I-STAT TROPONIN, ED: Troponin i, poc: 0 ng/mL (ref 0.00–0.08)

## 2014-09-05 MED ORDER — NITROGLYCERIN 0.4 MG SL SUBL
0.4000 mg | SUBLINGUAL_TABLET | SUBLINGUAL | Status: DC | PRN
  Administered 2014-09-05: 0.4 mg via SUBLINGUAL
  Filled 2014-09-05: qty 1

## 2014-09-05 MED ORDER — ASPIRIN 81 MG PO CHEW
324.0000 mg | CHEWABLE_TABLET | Freq: Once | ORAL | Status: AC
  Administered 2014-09-05: 324 mg via ORAL
  Filled 2014-09-05: qty 4

## 2014-09-05 NOTE — ED Notes (Signed)
Pt to ED via [facility] for evaluation of near syncopal episode prior to arrival.  Pt admits to having "blacking out episodes" occasionally due to his medications- pt reports that while washing dishes he began to feel like he was going to black out.  Pt wife assisted him the the chair- wife reports pt was alert but not responding to her for a period of 2 minutes.  Pt alert and answering questions upon arrival to ED.  Admits to some left sided chest pain that started after arrival to exam room.  EDP at bedside.

## 2014-09-05 NOTE — ED Notes (Signed)
Pt given urinal to attempt urine specimen.

## 2014-09-05 NOTE — ED Provider Notes (Signed)
CSN: [id number]     Arrival date & time 09/05/14  1930 History   First MD Initiated Contact with Patient 09/05/14 1939     Chief Complaint  Patient presents with  . Near Syncope     Patient is a 81 y.o. male presenting with near-syncope. The history is provided by the patient and a relative. No language interpreter was used.  Near Syncope   [name] presents for evaluation of syncope.  He was with his wife when he became unresponsive for about two minutes.  He was staring into space and nonverbal.  He has no recollection of the event.  He felt like he was going to black out before this happened and he has a hx/o similar things happening previously but never completely blacked out at that time.  He has some left sided chest pain currently that began upon ED arrival.  He denies HA, SOB, nausea, diaphoresis, cough, or other sxs.  He was recently started on cardura by the [place].    Past Medical History  Diagnosis Date  . Systolic CHF     EF 96-75% by echo 07/18/12  . Dyslipidemia (high LDL; low HDL)   . Coronary artery disease     s/p CABG; Anterolateral STEMI 06/2012 s/p BMS to mid LAD   . Heart murmur   . Peripheral vascular disease     thrombus- in leg- many yrs. ago- ?R- coumadin x1 mth  . Pneumonia     hosp. long time ago   . COPD (chronic obstructive pulmonary disease)     dc'd on home O2 06/2012  . Blood transfusion     post CABG  . Hepatitis     hepatitis- long time ago, occupational contamination   . GERD (gastroesophageal reflux disease)   . Cancer     prostate - Ca- radiation therapy- 2002  . Peripheral neuropathy   . Hiatal hernia   . Arthritis     hnp, lumbar, ankle (foot drop-R), wears a splint, arthritis - "all over", low bone density   . Depression     uses Zoloft   . VT (ventricular tachycardia) 06/2012    in the setting of STEMI  . Bacteremia 06/2012    in the setting of UTI/Prostatits, Blood Cx E.Coli tx w/ Bactrim  . Anemia   . Glucose intolerance  (impaired glucose tolerance) 06/2012    A1C 5.8  . Prostatitis   . Myocardial infarction     several, the last was 06/2012  . H/O cardiac arrest     x2  . Anginal pain   . Hypertension   . Asthma   . History of kidney stones    Past Surgical History  Procedure Laterality Date  . Coronary artery bypass graft  1986 and 1993  . Ptca  2008  . Cardiac catheterization      2010- last cath, two stents at that time  . Eye surgery      cataracts - bilateral , IOL  . Hernia repair      bilateral inguiinal repair   . Fracture surgery      heel- crushed -2002,  (hardware)   . Joint replacement      R knee- arthroscopic, x2, R great toe- 2 surgeries- /w hardware & thenlater fused by Dr. Sharol Given    . Rhinoplasty  [id number]    as a teen, followed by same procedure at 1970's   . Lumbar laminectomy/decompression microdiscectomy  08/10/2011    Procedure: LUMBAR  LAMINECTOMY/DECOMPRESSION MICRODISCECTOMY;  Surgeon: Charlie Pitter;  Location: [facility];  Service: Neurosurgery;  Laterality: Right;  RIGHT Lumbar five-sacral one LAMINECTOMY, MICRODISCECTOMY  . Lumbar wound debridement  08/22/2011    Procedure: LUMBAR WOUND DEBRIDEMENT;  Surgeon: Eustace Moore;  Location: [facility];  Service: Neurosurgery;  Laterality: N/A;  Repair of CSF Leak requiring laminectomy  . Coronary angioplasty with stent placement  X 4  . Cystoscopy w/ ureteral stent placement Right 02/28/2013    Procedure: CYSTOSCOPY WITH RETROGRADE PYELOGRAM/URETERAL STENT PLACEMENT;  Surgeon: Dutch Gray, MD;  Location: [facility];  Service: Urology;  Laterality: Right;  . Tonsillectomy  1940  . Appendectomy  1943  . Hammer toe surgery      right little toe  . Transurethral resection of prostate      x2  . Foot surgery Left 2000    heel fusion  . Toe surgery Right 2002  . Cystoscopy with ureteroscopy and stent placement Right 03/23/2013    Procedure: RIGHT URETEROSCOPY, LASER LITHO AND STENT PLACEMENT;  Surgeon: Fredricka Bonine,  MD;  Location: [facility];  Service: Urology;  Laterality: Right;  . Holmium laser application Right 09/01/7251    Procedure: HOLMIUM LASER APPLICATION;  Surgeon: Fredricka Bonine, MD;  Location: [facility];  Service: Urology;  Laterality: Right;  . Intramedullary (im) nail intertrochanteric Right 08/29/2013    Procedure: INTRAMEDULLARY (IM) NAIL INTERTROCHANTRIC;  Surgeon: Marianna Payment, MD;  Location: [facility];  Service: Orthopedics;  Laterality: Right;  . Left and right heart catheterization with coronary angiogram N/A 07/15/2012    Procedure: LEFT AND RIGHT HEART CATHETERIZATION WITH CORONARY ANGIOGRAM;  Surgeon: Jettie Booze, MD;  Location: [facility] CATH LAB;  Service: Cardiovascular;  Laterality: N/A;  . Percutaneous coronary stent intervention (pci-s) N/A 07/15/2012    Procedure: PERCUTANEOUS CORONARY STENT INTERVENTION (PCI-S);  Surgeon: Jettie Booze, MD;  Location: [facility] CATH LAB;  Service: Cardiovascular;  Laterality: N/A;   Family History  Problem Relation Age of Onset  . Heart attack Mother   . Addison's disease Father   . Heart attack Sister   . Anesthesia problems Neg Hx   . Hypotension Neg Hx   . Malignant hyperthermia Neg Hx   . Pseudochol deficiency Neg Hx    History  Substance Use Topics  . Smoking status: Never Smoker   . Smokeless tobacco: Never Used  . Alcohol Use: No    Review of Systems  Cardiovascular: Positive for near-syncope.  All other systems reviewed and are negative.     Allergies  Review of patient's allergies indicates no known allergies.  Home Medications   Prior to Admission medications   Medication Sig Start Date End Date Taking? Authorizing Provider  acetaminophen (TYLENOL) 500 MG tablet Take 500 mg by mouth 2 (two) times daily. Once in the am Once in the pm    Historical Provider, MD  aspirin 81 MG tablet Take 81 mg by mouth daily.  10/27/12   Darlin Coco, MD  atorvastatin (LIPITOR) 80 MG tablet Take 80 mg by mouth every  evening.  10/27/12   Darlin Coco, MD  budesonide-formoterol [facility]) 80-4.5 MCG/ACT inhaler Inhale 2 puffs into the lungs 2 (two) times daily. 09/12/12   Tanda Rockers, MD  carbamazepine (CARBATROL) 300 MG 12 hr capsule Take 300 mg by mouth 2 (two) times daily.     Historical Provider, MD  clopidogrel (PLAVIX) 75 MG tablet Take 75 mg by mouth daily.      Historical  Provider, MD  Docusate Sodium (DSS) 100 MG CAPS Take 100 mg by mouth 2 (two) times daily. 10/02/13   Liliane Shi, PA-C  doxazosin (CARDURA) 2 MG tablet Take 2 mg by mouth daily.    Historical Provider, MD  finasteride (PROSCAR) 5 MG tablet Take 5 mg by mouth daily.     Historical Provider, MD  furosemide (LASIX) 40 MG tablet Take 40 mg by mouth every morning.    Historical Provider, MD  ipratropium (ATROVENT HFA) 17 MCG/ACT inhaler Inhale 2 puffs into the lungs 2 (two) times daily as needed for wheezing.     Historical Provider, MD  isosorbide mononitrate (IMDUR) 60 MG 24 hr tablet Take 90 mg by mouth daily.    Historical Provider, MD  metoprolol succinate (TOPROL-XL) 25 MG 24 hr tablet 1/2  tablet daily 12/04/13   Darlin Coco, MD  NITROSTAT 0.4 MG SL tablet PLACE 1 TABLET UNDER THE TONGUE EVERY 5 MINUTES ASDIRECTED BY PHYSICIAN 06/18/13   Darlin Coco, MD  potassium chloride SA (K-DUR,KLOR-CON) 20 MEQ tablet Take 20 mEq by mouth daily.     Historical Provider, MD  ranitidine (ZANTAC) 150 MG tablet TAKE 1 TABLET BY MOUTH TWICE DAILY. 04/10/14   Darlin Coco, MD  sertraline (ZOLOFT) 100 MG tablet Take 150 mg by mouth every morning.     Historical Provider, MD  temazepam (RESTORIL) 15 MG capsule Take one capsule by mouth every night at bedtime as needed for sleep 07/19/14   Darlin Coco, MD   BP 118/56 mmHg  Pulse 65  Temp(Src) 97.5 F (36.4 C) (Oral)  Resp 18  SpO2 95% Physical Exam  Constitutional: He is oriented to person, place, and time. No distress.  Chronically ill appearing  HENT:  Head: Normocephalic  and atraumatic.  Eyes: Pupils are equal, round, and reactive to light.  Cardiovascular: Normal rate and regular rhythm.   SEM at LSB  Pulmonary/Chest: Effort normal and breath sounds normal.  Abdominal: Soft. There is no tenderness. There is no rebound and no guarding.  Musculoskeletal: He exhibits no edema or tenderness.  Neurological: He is alert and oriented to person, place, and time.  Skin: Skin is warm and dry.  Psychiatric: He has a normal mood and affect. His behavior is normal.  Nursing note and vitals reviewed.   ED Course  Procedures (including critical care time) Labs Review Labs Reviewed  BASIC METABOLIC PANEL - Abnormal; Notable for the following:    Glucose, Bld 167 (*)    GFR calc non Af Amer 62 (*)    GFR calc Af Amer 72 (*)    All other components within normal limits  CBC WITH DIFFERENTIAL - Abnormal; Notable for the following:    RBC 3.58 (*)    Hemoglobin 12.4 (*)    HCT 35.9 (*)    MCV 100.3 (*)    MCH 34.6 (*)    All other components within normal limits  URINALYSIS, ROUTINE W REFLEX MICROSCOPIC  I-STAT TROPOININ, ED    Imaging Review Dg Chest Port 1 View  09/05/2014   CLINICAL DATA:  Pt to ED via [facility] for evaluation of near syncopal episode prior to arrival. Pt admits to having "blacking out episodes" occasionally due to his medications- pt reports that while washing dishes he began to feel like he was going to black out. Pt wife assisted him the the chair- wife reports pt was alert but not responding to her for a period of 2 minutes. Pt alert and answering  questions upon arrival to ED. Admits to some left sided chest pain that started after arrival to exam room. Pt has hx of CGF, GERD, PNA, heart murmur, PVD, Hepatitis, prostate ca, VT, anemia, MI several last one 11/13, asthma, HTN, anginal pain. Non-Smoker.  EXAM: PORTABLE CHEST - 1 VIEW  COMPARISON:  08/28/2013  FINDINGS: Lungs are hypoinflated without focal consolidation or effusion. Cardiomediastinal  silhouette and remainder the exam is unchanged to include node calcified plaque over the aortic arch.  IMPRESSION: No active disease.   Electronically Signed   By: Marin Olp M.D.   On: 09/05/2014 20:11     EKG Interpretation   Date/Time:  Thursday September 05 2014 19:31:45 EST Ventricular Rate:  62 PR Interval:  80 QRS Duration: 183 QT Interval:  477 QTC Calculation: 484 R Axis:   93 Text Interpretation:  Sinus rhythm Short PR interval Left bundle branch  block Baseline wander in lead(s) V3 Confirmed by Hazle Coca [phone]) on  09/05/2014 7:41:17 PM      MDM   Final diagnoses:  Syncope, unspecified syncope type  Chest pain, unspecified chest pain type    Patient with extensive cardiac history here for evaluation of syncope and chest pain. In terms of syncope, patient with multiple prior episodes of near syncope but never with complete loss of consciousness. Patient is hemodynamically stable at this time with no focal neurologic deficits. Discussed case with with cardiology given patient's extensive cardiac history.  Patient was seen in the ED in consult by cardiology service with recommendation for medicine admission for further evaluation of syncopal event. Discussed case with family medicine regarding admission for syncope.    Shane Reichert, MD 09/06/14 [phone]

## 2014-09-05 NOTE — ED Notes (Signed)
CP reassessed - pt states "pain has eased up some to a 5/10." Declined another dose of SL nitro because "he just feels exhausted."

## 2014-09-05 NOTE — ED Notes (Signed)
Pt states he doesn't think he's going to be able to use the bathroom. RN notified.

## 2014-09-06 ENCOUNTER — Other Ambulatory Visit: Payer: Self-pay | Admitting: Physician Assistant

## 2014-09-06 ENCOUNTER — Inpatient Hospital Stay (HOSPITAL_COMMUNITY): Payer: Medicare Other

## 2014-09-06 ENCOUNTER — Other Ambulatory Visit: Payer: Self-pay

## 2014-09-06 DIAGNOSIS — R079 Chest pain, unspecified: Secondary | ICD-10-CM

## 2014-09-06 DIAGNOSIS — R001 Bradycardia, unspecified: Secondary | ICD-10-CM | POA: Diagnosis present

## 2014-09-06 DIAGNOSIS — R55 Syncope and collapse: Principal | ICD-10-CM

## 2014-09-06 DIAGNOSIS — I369 Nonrheumatic tricuspid valve disorder, unspecified: Secondary | ICD-10-CM

## 2014-09-06 DIAGNOSIS — K219 Gastro-esophageal reflux disease without esophagitis: Secondary | ICD-10-CM | POA: Diagnosis present

## 2014-09-06 DIAGNOSIS — N401 Enlarged prostate with lower urinary tract symptoms: Secondary | ICD-10-CM | POA: Insufficient documentation

## 2014-09-06 DIAGNOSIS — I739 Peripheral vascular disease, unspecified: Secondary | ICD-10-CM | POA: Diagnosis present

## 2014-09-06 DIAGNOSIS — I447 Left bundle-branch block, unspecified: Secondary | ICD-10-CM | POA: Diagnosis present

## 2014-09-06 DIAGNOSIS — Z951 Presence of aortocoronary bypass graft: Secondary | ICD-10-CM | POA: Diagnosis not present

## 2014-09-06 DIAGNOSIS — Z955 Presence of coronary angioplasty implant and graft: Secondary | ICD-10-CM | POA: Diagnosis not present

## 2014-09-06 DIAGNOSIS — E785 Hyperlipidemia, unspecified: Secondary | ICD-10-CM | POA: Diagnosis present

## 2014-09-06 DIAGNOSIS — I2510 Atherosclerotic heart disease of native coronary artery without angina pectoris: Secondary | ICD-10-CM | POA: Diagnosis present

## 2014-09-06 DIAGNOSIS — N132 Hydronephrosis with renal and ureteral calculous obstruction: Secondary | ICD-10-CM | POA: Diagnosis present

## 2014-09-06 DIAGNOSIS — Z8546 Personal history of malignant neoplasm of prostate: Secondary | ICD-10-CM | POA: Diagnosis not present

## 2014-09-06 DIAGNOSIS — I10 Essential (primary) hypertension: Secondary | ICD-10-CM | POA: Diagnosis present

## 2014-09-06 DIAGNOSIS — Z9981 Dependence on supplemental oxygen: Secondary | ICD-10-CM | POA: Diagnosis not present

## 2014-09-06 DIAGNOSIS — I472 Ventricular tachycardia: Secondary | ICD-10-CM | POA: Diagnosis present

## 2014-09-06 DIAGNOSIS — F329 Major depressive disorder, single episode, unspecified: Secondary | ICD-10-CM | POA: Diagnosis present

## 2014-09-06 DIAGNOSIS — I252 Old myocardial infarction: Secondary | ICD-10-CM | POA: Diagnosis not present

## 2014-09-06 DIAGNOSIS — I951 Orthostatic hypotension: Secondary | ICD-10-CM | POA: Diagnosis present

## 2014-09-06 DIAGNOSIS — Z8674 Personal history of sudden cardiac arrest: Secondary | ICD-10-CM | POA: Diagnosis not present

## 2014-09-06 DIAGNOSIS — R338 Other retention of urine: Secondary | ICD-10-CM

## 2014-09-06 DIAGNOSIS — D62 Acute posthemorrhagic anemia: Secondary | ICD-10-CM | POA: Diagnosis present

## 2014-09-06 DIAGNOSIS — N2889 Other specified disorders of kidney and ureter: Secondary | ICD-10-CM

## 2014-09-06 DIAGNOSIS — Z96651 Presence of right artificial knee joint: Secondary | ICD-10-CM | POA: Diagnosis present

## 2014-09-06 DIAGNOSIS — I5042 Chronic combined systolic (congestive) and diastolic (congestive) heart failure: Secondary | ICD-10-CM | POA: Diagnosis present

## 2014-09-06 DIAGNOSIS — M810 Age-related osteoporosis without current pathological fracture: Secondary | ICD-10-CM | POA: Diagnosis present

## 2014-09-06 DIAGNOSIS — G629 Polyneuropathy, unspecified: Secondary | ICD-10-CM | POA: Diagnosis present

## 2014-09-06 DIAGNOSIS — Z87442 Personal history of urinary calculi: Secondary | ICD-10-CM | POA: Diagnosis not present

## 2014-09-06 DIAGNOSIS — J9610 Chronic respiratory failure, unspecified whether with hypoxia or hypercapnia: Secondary | ICD-10-CM | POA: Diagnosis present

## 2014-09-06 DIAGNOSIS — R072 Precordial pain: Secondary | ICD-10-CM

## 2014-09-06 DIAGNOSIS — J45909 Unspecified asthma, uncomplicated: Secondary | ICD-10-CM | POA: Diagnosis present

## 2014-09-06 DIAGNOSIS — Z7982 Long term (current) use of aspirin: Secondary | ICD-10-CM | POA: Diagnosis not present

## 2014-09-06 DIAGNOSIS — J449 Chronic obstructive pulmonary disease, unspecified: Secondary | ICD-10-CM | POA: Diagnosis present

## 2014-09-06 LAB — TROPONIN I
Troponin I: <0.03 ng/mL (ref <0.031)
Troponin I: <0.03 ng/mL (ref <0.031)
Troponin I: <0.03 ng/mL (ref <0.031)

## 2014-09-06 MED ORDER — TAMSULOSIN HCL 0.4 MG PO CAPS: 0.4000 mg | ORAL_CAPSULE | Freq: Every day | ORAL | Status: AC

## 2014-09-06 MED ORDER — FUROSEMIDE 40 MG PO TABS
40.0000 mg | ORAL_TABLET | Freq: Every morning | ORAL | Status: DC
  Administered 2014-09-06: 40 mg via ORAL
  Filled 2014-09-06: qty 1

## 2014-09-06 MED ORDER — SODIUM CHLORIDE 0.9 % IJ SOLN: 3.0000 mL | INTRAMUSCULAR | Status: DC | PRN

## 2014-09-06 MED ORDER — NITROGLYCERIN 0.4 MG SL SUBL
0.4000 mg | SUBLINGUAL_TABLET | SUBLINGUAL | Status: DC | PRN
  Administered 2014-09-06 (×2): 0.4 mg via SUBLINGUAL
  Filled 2014-09-06 (×2): qty 1

## 2014-09-06 MED ORDER — ACETAMINOPHEN 325 MG PO TABS: 650.0000 mg | ORAL_TABLET | ORAL | Status: DC | PRN

## 2014-09-06 MED ORDER — HEPARIN SODIUM (PORCINE) 5000 UNIT/ML IJ SOLN
5000.0000 [IU] | Freq: Three times a day (TID) | INTRAMUSCULAR | Status: DC
  Administered 2014-09-06: 5000 [IU] via SUBCUTANEOUS
  Filled 2014-09-06: qty 1

## 2014-09-06 MED ORDER — ASPIRIN EC 81 MG PO TBEC
81.0000 mg | DELAYED_RELEASE_TABLET | Freq: Every evening | ORAL | Status: DC
  Administered 2014-09-06: 81 mg via ORAL
  Filled 2014-09-06: qty 1

## 2014-09-06 MED ORDER — DOXAZOSIN MESYLATE 2 MG PO TABS
2.0000 mg | ORAL_TABLET | Freq: Every day | ORAL | Status: DC
  Filled 2014-09-06: qty 1

## 2014-09-06 MED ORDER — METOPROLOL SUCCINATE ER 25 MG PO TB24
12.5000 mg | ORAL_TABLET | Freq: Every day | ORAL | Status: DC
  Administered 2014-09-06: 12.5 mg via ORAL
  Filled 2014-09-06: qty 1

## 2014-09-06 MED ORDER — CLOPIDOGREL BISULFATE 75 MG PO TABS
75.0000 mg | ORAL_TABLET | Freq: Every day | ORAL | Status: DC
  Administered 2014-09-06: 75 mg via ORAL
  Filled 2014-09-06: qty 1

## 2014-09-06 MED ORDER — ATORVASTATIN CALCIUM 80 MG PO TABS
80.0000 mg | ORAL_TABLET | Freq: Every evening | ORAL | Status: DC
  Administered 2014-09-06: 80 mg via ORAL
  Filled 2014-09-06: qty 1

## 2014-09-06 MED ORDER — SODIUM CHLORIDE 0.9 % IV SOLN: 250.0000 mL | INTRAVENOUS | Status: DC | PRN

## 2014-09-06 MED ORDER — REGADENOSON 0.4 MG/5ML IV SOLN
INTRAVENOUS | Status: AC
  Administered 2014-09-06: 0.4 mg via INTRAVENOUS
  Filled 2014-09-06: qty 5

## 2014-09-06 MED ORDER — SODIUM CHLORIDE 0.9 % IJ SOLN
3.0000 mL | Freq: Two times a day (BID) | INTRAMUSCULAR | Status: DC
  Administered 2014-09-06: 3 mL via INTRAVENOUS

## 2014-09-06 MED ORDER — REGADENOSON 0.4 MG/5ML IV SOLN
0.4000 mg | Freq: Once | INTRAVENOUS | Status: AC
  Administered 2014-09-06: 0.4 mg via INTRAVENOUS
  Filled 2014-09-06: qty 5

## 2014-09-06 MED ORDER — ISOSORBIDE MONONITRATE ER 30 MG PO TB24
90.0000 mg | ORAL_TABLET | Freq: Every day | ORAL | Status: DC
  Administered 2014-09-06: 90 mg via ORAL
  Filled 2014-09-06 (×2): qty 1

## 2014-09-06 MED ORDER — DOCUSATE SODIUM 100 MG PO CAPS
100.0000 mg | ORAL_CAPSULE | Freq: Two times a day (BID) | ORAL | Status: DC
  Administered 2014-09-06: 100 mg via ORAL
  Filled 2014-09-06 (×3): qty 1

## 2014-09-06 MED ORDER — TECHNETIUM TC 99M SESTAMIBI GENERIC - CARDIOLITE
30.0000 | Freq: Once | INTRAVENOUS | Status: AC | PRN
  Administered 2014-09-06: 30 via INTRAVENOUS

## 2014-09-06 MED ORDER — TECHNETIUM TC 99M SESTAMIBI GENERIC - CARDIOLITE
10.0000 | Freq: Once | INTRAVENOUS | Status: AC | PRN
  Administered 2014-09-06: 10 via INTRAVENOUS

## 2014-09-06 MED ORDER — FINASTERIDE 5 MG PO TABS
5.0000 mg | ORAL_TABLET | Freq: Every day | ORAL | Status: DC
  Administered 2014-09-06: 5 mg via ORAL
  Filled 2014-09-06: qty 1

## 2014-09-06 MED ORDER — CARBAMAZEPINE ER 100 MG PO TB12
300.0000 mg | ORAL_TABLET | Freq: Two times a day (BID) | ORAL | Status: DC
  Administered 2014-09-06: 300 mg via ORAL
  Filled 2014-09-06 (×2): qty 3

## 2014-09-06 MED ORDER — BUDESONIDE-FORMOTEROL FUMARATE 80-4.5 MCG/ACT IN AERO
2.0000 | INHALATION_SPRAY | Freq: Two times a day (BID) | RESPIRATORY_TRACT | Status: DC
  Administered 2014-09-06: 2 via RESPIRATORY_TRACT
  Filled 2014-09-06: qty 6.9

## 2014-09-06 MED ORDER — ONDANSETRON HCL 4 MG/2ML IJ SOLN: 4.0000 mg | Freq: Four times a day (QID) | INTRAMUSCULAR | Status: DC | PRN

## 2014-09-06 MED ORDER — SERTRALINE HCL 50 MG PO TABS
150.0000 mg | ORAL_TABLET | Freq: Every day | ORAL | Status: DC
  Administered 2014-09-06: 150 mg via ORAL
  Filled 2014-09-06 (×2): qty 1

## 2014-09-06 NOTE — Progress Notes (Signed)
     The patient was seen in nuclear medicine for a lexiscan myoview. He tolerated the procedure well. No acute ST or TW changes on ECG.    Perry Mount PA-C  [facility]

## 2014-09-06 NOTE — H&P (Signed)
[facility] Admission History and Physical Service Pager: [phone]  Patient name: [name] Medical record number: [id number] Date of birth: 13-Apr-1925 Age: 81 y.o. Gender: male  Primary Care Provider: Darlin Coco, MD Consultants: cardiology Code Status: full code  Chief Complaint: syncope  Assessment and Plan: [name] is a 81 y.o. male presenting with syncope. PMH is significant for coronary artery disease, MI, CABG, GERD, COPD, systolic CHF, hypertension, hyperlipidemia.  #Syncope, chest pain: No specific etiology is definite at this time. Possibly multifactorial. Patient had upsetting event earlier in the day regarding his daughter who is chronically ill and a nursing facility resident. This suggests possible vasovagal etiology. Orthostatics negative in the emergency room. Patient also recently started on Cardura, which has known potential orthostatic hypotension side effect. Given patient's significant cardiac history, and his present chest pain, it is prudent to admit him overnight for observation and cycling of his troponins, and evaluation by cardiology in the morning for possible cardiogenic syncope. -Admit to telemetry unit -Cycle troponins 3, EKG in the morning repeat -Update echo, last echo done in June 2014 -Consult cardiology in the morning -Has already received aspirin -Nitroglycerin sublingual when necessary for chest pain -CT head not done in the emergency room, will hold off on this overnight  #CAD status post MI and CABG, with multiple stents -Cycling troponins as above -Continue aspirin, Imdur, Lipitor, Plavix  #COPD: On 2 L O2 daily at bedtime at home -Continue home O2 -Continue symbicort  #CHF: last echo June 2014, showed EF 56-43%, grade 1 diastolic dysfxn. Appears euvolemic presently - continue lasix  #Depression: Continue Zoloft -Patient also on carbamazepine, unclear if this is related to mental  illness  FEN/GI: heart healthy diet, SLIV Prophylaxis: SQ heparin  Disposition: admit to telemetry, dispo pending further eval  History of Present Illness: [name] is a 81 y.o. male presenting with syncope.  Patient was at home washing dishes, when he began to feel dizzy. The next thing he knew who is sitting in a chair with his wife calling 911. Reportedly he just slumped down into the chair. He denies any preceding chest pain, palpitations, or other symptoms other than dizziness. He has had occasional dizziness at home, mostly when he has been standing for some time. He has felt like he was Perez to pass out in the past, but is usually able to make it to the bed to lie down in time. After coming to the hospital for evaluation, he began to have chest discomfort which he describes as aching. He also had some stiffness in his left neck and jaw. This discomfort eased after getting aspirin and nitroglycerin.  Patient was recently started on Cardura and this has been titrating up. He's currently on 2 milligrams per day, with a goal of apparently having him on 4 mg per day. He denies any current shortness of breath or palpitations. He had some GI upset with diarrhea earlier this week, but that has largely resolved.  Cardiology was contacted by the emergency room physician, and stated that they felt he needed admission for ACS rule out and monitoring.  Review Of Systems: Per HPI with the following additions: none Otherwise 12 point review of systems was performed and was unremarkable.  Patient Active Problem List   Diagnosis Date Noted  . Systolic CHF 32/95/1884  . Depression 09/26/2013  . Orthostatic hypotension 09/26/2013  . Acute posthemorrhagic anemia 09/07/2013  . Intertrochanteric fracture of right hip 08/29/2013  . Hip  fracture 08/29/2013  . Hypertension   . GERD (gastroesophageal reflux disease)   . COPD (chronic obstructive pulmonary disease)   . H/O cardiac arrest   .  Malaise and fatigue 05/09/2013  . Junctional bradycardia 07/25/2012  . Glucose intolerance (impaired glucose tolerance) 07/19/2012  . Normocytic anemia 07/19/2012  . SIRS (systemic inflammatory response syndrome) 07/17/2012  . Bacteremia due to Escherichia coli 07/17/2012  . Acute MI, anterolateral wall 07/16/2012  . Laceration of head 07/16/2012  . Prostatitis 07/16/2012  . E. coli UTI (urinary tract infection) 07/16/2012  . Elevated transaminase level 07/16/2012  . Foot drop, right 07/28/2011  . LBBB (left bundle branch block) 03/29/2011  . Hx of CABG 03/29/2011  . Ischemic heart disease   . Myocardial infarction   . Acute systolic congestive heart failure, NYHA class 2-EF 40-45%   . Chronic respiratory failure   . Chronic asthma   . Peripheral neuropathy   . Dizziness   . Dyslipidemia (high LDL; low HDL)   . PERIPHERAL NEUROPATHY 04/04/2007  . Essential hypertension 04/04/2007  . PSORIASIS 04/04/2007  . OSTEOARTHRITIS 04/04/2007  . OSTEOPOROSIS 04/04/2007  . PROSTATE CANCER, HX OF 04/04/2007  . HYPERLIPIDEMIA 01/05/2007  . Cor Athrscl-Uns Vessel 01/05/2007  . GERD 01/05/2007  . HERNIA 01/05/2007   Past Medical History: Past Medical History  Diagnosis Date  . Systolic CHF     EF 02-54% by echo 07/18/12  . Dyslipidemia (high LDL; low HDL)   . Coronary artery disease     s/p CABG; Anterolateral STEMI 06/2012 s/p BMS to mid LAD   . Heart murmur   . Peripheral vascular disease     thrombus- in leg- many yrs. ago- ?R- coumadin x1 mth  . Pneumonia     hosp. long time ago   . COPD (chronic obstructive pulmonary disease)     dc'd on home O2 06/2012  . Blood transfusion     post CABG  . Hepatitis     hepatitis- long time ago, occupational contamination   . GERD (gastroesophageal reflux disease)   . Cancer     prostate - Ca- radiation therapy- 2002  . Peripheral neuropathy   . Hiatal hernia   . Arthritis     hnp, lumbar, ankle (foot drop-R), wears a splint, arthritis  - "all over", low bone density   . Depression     uses Zoloft   . VT (ventricular tachycardia) 06/2012    in the setting of STEMI  . Bacteremia 06/2012    in the setting of UTI/Prostatits, Blood Cx E.Coli tx w/ Bactrim  . Anemia   . Glucose intolerance (impaired glucose tolerance) 06/2012    A1C 5.8  . Prostatitis   . Myocardial infarction     several, the last was 06/2012  . H/O cardiac arrest     x2  . Anginal pain   . Hypertension   . Asthma   . History of kidney stones    Past Surgical History: Past Surgical History  Procedure Laterality Date  . Coronary artery bypass graft  1986 and 1993  . Ptca  2008  . Cardiac catheterization      2010- last cath, two stents at that time  . Eye surgery      cataracts - bilateral , IOL  . Hernia repair      bilateral inguiinal repair   . Fracture surgery      heel- crushed -2002,  (hardware)   . Joint replacement  R knee- arthroscopic, x2, R great toe- 2 surgeries- /w hardware & thenlater fused by Dr. Sharol Given    . Rhinoplasty  4098,1191    as a teen, followed by same procedure at 1970's   . Lumbar laminectomy/decompression microdiscectomy  08/10/2011    Procedure: LUMBAR LAMINECTOMY/DECOMPRESSION MICRODISCECTOMY;  Surgeon: Cooper Render Pool;  Location: [facility];  Service: Neurosurgery;  Laterality: Right;  RIGHT Lumbar five-sacral one LAMINECTOMY, MICRODISCECTOMY  . Lumbar wound debridement  08/22/2011    Procedure: LUMBAR WOUND DEBRIDEMENT;  Surgeon: Eustace Moore;  Location: [facility];  Service: Neurosurgery;  Laterality: N/A;  Repair of CSF Leak requiring laminectomy  . Coronary angioplasty with stent placement  X 4  . Cystoscopy w/ ureteral stent placement Right 02/28/2013    Procedure: CYSTOSCOPY WITH RETROGRADE PYELOGRAM/URETERAL STENT PLACEMENT;  Surgeon: Dutch Gray, MD;  Location: [facility];  Service: Urology;  Laterality: Right;  . Tonsillectomy  1940  . Appendectomy  1943  . Hammer toe surgery      right little toe  .  Transurethral resection of prostate      x2  . Foot surgery Left 2000    heel fusion  . Toe surgery Right 2002  . Cystoscopy with ureteroscopy and stent placement Right 03/23/2013    Procedure: RIGHT URETEROSCOPY, LASER LITHO AND STENT PLACEMENT;  Surgeon: Fredricka Bonine, MD;  Location: [facility];  Service: Urology;  Laterality: Right;  . Holmium laser application Right 4/78/2956    Procedure: HOLMIUM LASER APPLICATION;  Surgeon: Fredricka Bonine, MD;  Location: [facility];  Service: Urology;  Laterality: Right;  . Intramedullary (im) nail intertrochanteric Right 08/29/2013    Procedure: INTRAMEDULLARY (IM) NAIL INTERTROCHANTRIC;  Surgeon: Marianna Payment, MD;  Location: [facility];  Service: Orthopedics;  Laterality: Right;  . Left and right heart catheterization with coronary angiogram N/A 07/15/2012    Procedure: LEFT AND RIGHT HEART CATHETERIZATION WITH CORONARY ANGIOGRAM;  Surgeon: Jettie Booze, MD;  Location: [facility] CATH LAB;  Service: Cardiovascular;  Laterality: N/A;  . Percutaneous coronary stent intervention (pci-s) N/A 07/15/2012    Procedure: PERCUTANEOUS CORONARY STENT INTERVENTION (PCI-S);  Surgeon: Jettie Booze, MD;  Location: [facility];  Service: Cardiovascular;  Laterality: N/A;   Social History: History  Substance Use Topics  . Smoking status: Never Smoker   . Smokeless tobacco: Never Used  . Alcohol Use: No   Additional social history: denies tobacco, alcohol drug use. 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  Please also refer to relevant sections of EMR.  Family History: Family History  Problem Relation Age of Onset  . Heart attack Mother   . Addison's disease Father   . Heart attack Sister   . Anesthesia problems Neg Hx   . Hypotension Neg Hx   . Malignant hyperthermia Neg Hx   . Pseudochol deficiency Neg Hx    Allergies and Medications: No Known Allergies No current facility-administered medications on file prior to encounter.    Current Outpatient Prescriptions on File Prior to Encounter  Medication Sig Dispense Refill  . acetaminophen (TYLENOL) 500 MG tablet Take 500 mg by mouth at bedtime. Take at bedtime every night per patient    . aspirin 81 MG tablet Take 81 mg by mouth every evening.  30 tablet 6  . atorvastatin (LIPITOR) 80 MG tablet Take 80 mg by mouth every evening.  30 tablet 6  . budesonide-formoterol (SYMBICORT) 80-4.5 MCG/ACT inhaler Inhale 2 puffs into the lungs 2 (  two) times daily.    . carbamazepine (CARBATROL) 300 MG 12 hr capsule Take 300 mg by mouth 2 (two) times daily.     . clopidogrel (PLAVIX) 75 MG tablet Take 75 mg by mouth daily.      Mariane Baumgarten Sodium (DSS) 100 MG CAPS Take 100 mg by mouth 2 (two) times daily.    [name] doxazosin (CARDURA) 2 MG tablet Take 2 mg by mouth daily.    . finasteride (PROSCAR) 5 MG tablet Take 5 mg by mouth daily.     . furosemide (LASIX) 40 MG tablet Take 40 mg by mouth every morning.    [name] ipratropium (ATROVENT HFA) 17 MCG/ACT inhaler Inhale 2 puffs into the lungs 2 (two) times daily as needed for wheezing.     . isosorbide mononitrate (IMDUR) 60 MG 24 hr tablet Take 30-60 mg by mouth daily. Take 90mg  total a day take a whole 60mg  tablet then split one in half to take with the 60mg     . metoprolol succinate (TOPROL-XL) 25 MG 24 hr tablet Take 12.5 mg by mouth daily. 1/2  tablet daily    . NITROSTAT 0.4 MG SL tablet PLACE 1 TABLET UNDER THE TONGUE EVERY 5 MINUTES ASDIRECTED BY PHYSICIAN 25 tablet 11  . potassium chloride SA (K-DUR,KLOR-CON) 20 MEQ tablet Take 20 mEq by mouth daily.     . sertraline (ZOLOFT) 100 MG tablet Take 150 mg by mouth See admin instructions. Take 150mg  total every day. Split the 100mg  in half to make total of 150mg     . temazepam (RESTORIL) 15 MG capsule Take one capsule by mouth every night at bedtime as needed for sleep 30 capsule 5  . ranitidine (ZANTAC) 150 MG tablet TAKE 1 TABLET BY MOUTH TWICE DAILY. (Patient not taking: Reported on  09/05/2014) 180 tablet 0    Objective: BP 146/59 mmHg  Pulse 63  Temp(Src) 97.5 F (36.4 C) (Oral)  Resp 16  SpO2 95% Exam: General: NAD, pleasant, cooperative, lying in bed HEENT: NCAT Cardiovascular: RRR 2/6 murmur Respiratory: CTAB NWOB Abdomen: soft NTTP, NABS Extremities: minimal 1+ pitting edema bilat lower ext. 2+ DP pulses bilat.  Skin: no rashes noted Neuro: excellent historian. Alert and oriented x 4. Grossly nonfocal, speech normal  Labs and Imaging: CBC BMET   Recent Labs Lab 09/05/14 1945  WBC 5.5  HGB 12.4*  HCT 35.9*  PLT 182    Recent Labs Lab 09/05/14 1945  NA 136  K 4.5  CL 102  CO2 26  BUN 11  CREATININE 1.05  GLUCOSE 167*  CALCIUM 9.5     EKG junctional rhythm, appears unchanged from prior  CXR no acute  Leeanne Rio, MD 09/06/2014, 12:46 AM PGY-3, [place] Intern pager: [phone], text pages welcome

## 2014-09-06 NOTE — Discharge Instructions (Signed)
-   Pick up Heart Monitor from [place] office on Wednesday (09/11/13) - Support hose to be worn during daytime - Small frequent meals instead of fewer large meals. - Avoid standing activities after large meals.  - Drink adequate volumes of water with meals - Avoid high carbohydrate meals - Please stop the use of Cardura. Printed prescription for Flomax given along with coupons.

## 2014-09-06 NOTE — Progress Notes (Signed)
Echocardiogram 2D Echocardiogram has been performed.  [name] 09/06/2014, 11:04 AM

## 2014-09-06 NOTE — Progress Notes (Signed)
Discharge instructions reviewed with patient and son at bedside. Patient denies any questions. AVS and prescription given to patient. IV and telemetry discontinued. Patient to get dressed and then son to drive him home.

## 2014-09-06 NOTE — Progress Notes (Signed)
PT Cancellation Note  Patient Details Name: [name] MRN: [id number] DOB: 1925-05-11   Cancelled Treatment:    Reason Eval/Treat Not Completed: Patient at procedure or test/unavailable (pt in nuclear medicine.)   Irwin Brakeman F 09/06/2014, 12:35 PM  [facility] [phone] (pager)

## 2014-09-06 NOTE — Consult Note (Signed)
CONSULTATION NOTE  Reason for Consult: Syncope, chest pain  Requesting Physician: Dr. McDiarmid  Cardiologist: Dr. Corlis Hove;  HPI: This is a 81 y.o. male with a past medical history significant for extensive ischemic heart disease. He had coronary artery bypass graft surgery in 1986. He had a redo CABG in 1993. He had PTCA with stent in October 2008. He had catheter with a drug-eluting stent in February 2010. He had a small non-Q MI in August 2010. His last nuclear stress test was 10/27/09 showing a moderate area of reversible ischemia in the inferolateral wall with an ejection fraction of 64%. He presented to the emergency room on 07/15/12 after falling at home. While in the emergency room he developed chest pain followed by ventricular tachycardia requiring cardioversion. He was taken emergently to the catheter lab treated with a bare-metal stent to the mid LAD was treated initially with IV amiodarone which was subsequently stopped prior to discharge. His echocardiogram November 2013 showed ejection fraction 40-45% and moderate left atrial enlargement. He has been treated by urologist Dr. [name] for right hydronephrosis secondary to renal calculus in the right ureter. Dr. [name] recently added doxazosin to help him with emptying his bladder.  He was washing dishes yesterday at the sink and had a syncopal episode. There was some prodrome, however, he did not feel his heart racing. The next thing he remembers was waking up on the floor. He has reported chest pain as well and had left chest pain this morning, relieved by nitro. He has also had some loose stools, but no frank diarrhea, nausea or vomiting.  PMHx:  Past Medical History  Diagnosis Date  . Systolic CHF     EF 73-42% by echo 07/18/12  . Dyslipidemia (high LDL; low HDL)   . Coronary artery disease     s/p CABG; Anterolateral STEMI 06/2012 s/p BMS to mid LAD   . Heart murmur   . Peripheral vascular disease     thrombus-  in leg- many yrs. ago- ?R- coumadin x1 mth  . Pneumonia     hosp. long time ago   . COPD (chronic obstructive pulmonary disease)     dc'd on home O2 06/2012  . Blood transfusion     post CABG  . Hepatitis     hepatitis- long time ago, occupational contamination   . GERD (gastroesophageal reflux disease)   . Cancer     prostate - Ca- radiation therapy- 2002  . Peripheral neuropathy   . Hiatal hernia   . Arthritis     hnp, lumbar, ankle (foot drop-R), wears a splint, arthritis - "all over", low bone density   . Depression     uses Zoloft   . VT (ventricular tachycardia) 06/2012    in the setting of STEMI  . Bacteremia 06/2012    in the setting of UTI/Prostatits, Blood Cx E.Coli tx w/ Bactrim  . Anemia   . Glucose intolerance (impaired glucose tolerance) 06/2012    A1C 5.8  . Prostatitis   . Myocardial infarction     several, the last was 06/2012  . H/O cardiac arrest     x2  . Anginal pain   . Hypertension   . Asthma   . History of kidney stones    Past Surgical History  Procedure Laterality Date  . Coronary artery bypass graft  1986 and 1993  . Ptca  2008  . Cardiac catheterization      2010- last cath, two stents at  that time  . Eye surgery      cataracts - bilateral , IOL  . Hernia repair      bilateral inguiinal repair   . Fracture surgery      heel- crushed -2002,  (hardware)   . Joint replacement      R knee- arthroscopic, x2, R great toe- 2 surgeries- /w hardware & thenlater fused by Dr. Sharol Given    . Rhinoplasty  4097,3532    as a teen, followed by same procedure at 1970's   . Lumbar laminectomy/decompression microdiscectomy  08/10/2011    Procedure: LUMBAR LAMINECTOMY/DECOMPRESSION MICRODISCECTOMY;  Surgeon: Cooper Render Pool;  Location: [place] [facility];  Service: Neurosurgery;  Laterality: Right;  RIGHT Lumbar five-sacral one LAMINECTOMY, MICRODISCECTOMY  . Lumbar wound debridement  08/22/2011    Procedure: LUMBAR WOUND DEBRIDEMENT;  Surgeon: Eustace Moore;   Location: [place] [facility];  Service: Neurosurgery;  Laterality: N/A;  Repair of CSF Leak requiring laminectomy  . Coronary angioplasty with stent placement  X 4  . Cystoscopy w/ ureteral stent placement Right 02/28/2013    Procedure: CYSTOSCOPY WITH RETROGRADE PYELOGRAM/URETERAL STENT PLACEMENT;  Surgeon: Dutch Gray, MD;  Location: [facility];  Service: Urology;  Laterality: Right;  . Tonsillectomy  1940  . Appendectomy  1943  . Hammer toe surgery      right little toe  . Transurethral resection of prostate      x2  . Foot surgery Left 2000    heel fusion  . Toe surgery Right 2002  . Cystoscopy with ureteroscopy and stent placement Right 03/23/2013    Procedure: RIGHT URETEROSCOPY, LASER LITHO AND STENT PLACEMENT;  Surgeon: Fredricka Bonine, MD;  Location: [facility];  Service: Urology;  Laterality: Right;  . Holmium laser application Right 9/92/4268    Procedure: HOLMIUM LASER APPLICATION;  Surgeon: Fredricka Bonine, MD;  Location: [facility];  Service: Urology;  Laterality: Right;  . Intramedullary (im) nail intertrochanteric Right 08/29/2013    Procedure: INTRAMEDULLARY (IM) NAIL INTERTROCHANTRIC;  Surgeon: Marianna Payment, MD;  Location: [place];  Service: Orthopedics;  Laterality: Right;  . Left and right heart catheterization with coronary angiogram N/A 07/15/2012    Procedure: LEFT AND RIGHT HEART CATHETERIZATION WITH CORONARY ANGIOGRAM;  Surgeon: Jettie Booze, MD;  Location: [facility] C[facility] LAB;  Service: Cardiovascular;  Laterality: N/A;  . Percutaneous coronary stent intervention (pci-s) N/A 07/15/2012    Procedure: PERCUTANEOUS CORONARY STENT INTERVENTION (PCI-S);  Surgeon: Jettie Booze, MD;  Location: [facility] CATH LAB;  Service: Cardiovascular;  Laterality: N/A;    FAMHx: Family History  Problem Relation Age of Onset  . Heart attack Mother   . Addison's disease Father   . Heart attack Sister   . Anesthesia problems Neg Hx   . Hypotension Neg Hx   . Malignant hyperthermia  Neg Hx   . Pseudochol deficiency Neg Hx     SOCHx:  reports that he has never smoked. He has never used smokeless tobacco. He reports that he does not drink alcohol or use illicit drugs.  ALLERGIES: No Known Allergies  ROS: A comprehensive review of systems was negative except for: Cardiovascular: positive for chest pain and syncope Neurological: positive for dizziness  HOME MEDICATIONS: Prescriptions prior to admission  Medication Sig Dispense Refill Last Dose  . acetaminophen (TYLENOL) 500 MG tablet Take 500 mg by mouth at bedtime. Take at bedtime every night per patient   09/04/2014 at Unknown time  . aspirin 81 MG tablet Take 81 mg  by mouth every evening.  30 tablet 6 09/04/2014 at Unknown time  . atorvastatin (LIPITOR) 80 MG tablet Take 80 mg by mouth every evening.  30 tablet 6 09/04/2014 at Unknown time  . budesonide-formoterol (SYMBICORT) 80-4.5 MCG/ACT inhaler Inhale 2 puffs into the lungs 2 (two) times daily.   09/05/2014 at Unknown time  . carbamazepine (CARBATROL) 300 MG 12 hr capsule Take 300 mg by mouth 2 (two) times daily.    09/05/2014 at Unknown time  . clopidogrel (PLAVIX) 75 MG tablet Take 75 mg by mouth daily.     09/04/2014 at Unknown time  . Docusate Sodium (DSS) 100 MG CAPS Take 100 mg by mouth 2 (two) times daily.   09/05/2014 at Unknown time  . doxazosin (CARDURA) 2 MG tablet Take 2 mg by mouth daily.   09/05/2014 at Unknown time  . finasteride (PROSCAR) 5 MG tablet Take 5 mg by mouth daily.    09/05/2014 at Unknown time  . furosemide (LASIX) 40 MG tablet Take 40 mg by mouth every morning.   09/05/2014 at Unknown time  . ipratropium (ATROVENT HFA) 17 MCG/ACT inhaler Inhale 2 puffs into the lungs 2 (two) times daily as needed for wheezing.    Past Week at Unknown time  . isosorbide mononitrate (IMDUR) 60 MG 24 hr tablet Take 30-60 mg by mouth daily. Take 13m total a day take a whole 668mtablet then split one in half to take with the 6035m 09/05/2014 at Unknown time  . metoprolol  succinate (TOPROL-XL) 25 MG 24 hr tablet Take 12.5 mg by mouth daily. 1/2  tablet daily   09/05/2014 at Unknown time  . NITROSTAT 0.4 MG SL tablet PLACE 1 TABLET UNDER THE TONGUE EVERY 5 MINUTES ASDIRECTED BY PHYSICIAN 25 tablet 11 Past Week at Unknown time  . potassium chloride SA (K-DUR,KLOR-CON) 20 MEQ tablet Take 20 mEq by mouth daily.    09/05/2014 at Unknown time  . sertraline (ZOLOFT) 100 MG tablet Take 150 mg by mouth See admin instructions. Take 150m20mtal every day. Split the 100mg28mhalf to make total of 150mg 49m7/2016 at Unknown time  . temazepam (RESTORIL) 15 MG capsule Take one capsule by mouth every night at bedtime as needed for sleep 30 capsule 5 09/04/2014 at Unknown time  . ranitidine (ZANTAC) 150 MG tablet TAKE 1 TABLET BY MOUTH TWICE DAILY. (Patient not taking: Reported on 09/05/2014) 180 tablet 0 Not Taking at Unknown time    HOSPITAL MEDICATIONS: Prior to Admission:  Prescriptions prior to admission  Medication Sig Dispense Refill Last Dose  . acetaminophen (TYLENOL) 500 MG tablet Take 500 mg by mouth at bedtime. Take at bedtime every night per patient   09/04/2014 at Unknown time  . aspirin 81 MG tablet Take 81 mg by mouth every evening.  30 tablet 6 09/04/2014 at Unknown time  . atorvastatin (LIPITOR) 80 MG tablet Take 80 mg by mouth every evening.  30 tablet 6 09/04/2014 at Unknown time  . budesonide-formoterol (SYMBICORT) 80-4.5 MCG/ACT inhaler Inhale 2 puffs into the lungs 2 (two) times daily.   09/05/2014 at Unknown time  . carbamazepine (CARBATROL) 300 MG 12 hr capsule Take 300 mg by mouth 2 (two) times daily.    09/05/2014 at Unknown time  . clopidogrel (PLAVIX) 75 MG tablet Take 75 mg by mouth daily.     09/04/2014 at Unknown time  . Docusate Sodium (DSS) 100 MG CAPS Take 100 mg by mouth 2 (two) times daily.  09/05/2014 at Unknown time  . doxazosin (CARDURA) 2 MG tablet Take 2 mg by mouth daily.   09/05/2014 at Unknown time  . finasteride (PROSCAR) 5 MG tablet Take 5 mg by mouth  daily.    09/05/2014 at Unknown time  . furosemide (LASIX) 40 MG tablet Take 40 mg by mouth every morning.   09/05/2014 at Unknown time  . ipratropium (ATROVENT HFA) 17 MCG/ACT inhaler Inhale 2 puffs into the lungs 2 (two) times daily as needed for wheezing.    Past Week at Unknown time  . isosorbide mononitrate (IMDUR) 60 MG 24 hr tablet Take 30-60 mg by mouth daily. Take 45m total a day take a whole 655mtablet then split one in half to take with the 601m 09/05/2014 at Unknown time  . metoprolol succinate (TOPROL-XL) 25 MG 24 hr tablet Take 12.5 mg by mouth daily. 1/2  tablet daily   09/05/2014 at Unknown time  . NITROSTAT 0.4 MG SL tablet PLACE 1 TABLET UNDER THE TONGUE EVERY 5 MINUTES ASDIRECTED BY PHYSICIAN 25 tablet 11 Past Week at Unknown time  . potassium chloride SA (K-DUR,KLOR-CON) 20 MEQ tablet Take 20 mEq by mouth daily.    09/05/2014 at Unknown time  . sertraline (ZOLOFT) 100 MG tablet Take 150 mg by mouth See admin instructions. Take 150m17mtal every day. Split the 100mg2mhalf to make total of 150mg 77m7/2016 at Unknown time  . temazepam (RESTORIL) 15 MG capsule Take one capsule by mouth every night at bedtime as needed for sleep 30 capsule 5 09/04/2014 at Unknown time  . ranitidine (ZANTAC) 150 MG tablet TAKE 1 TABLET BY MOUTH TWICE DAILY. (Patient not taking: Reported on 09/05/2014) 180 tablet 0 Not Taking at Unknown time    VITALS: Blood pressure 125/71, pulse 81, temperature 98 F (36.7 C), temperature source Oral, resp. rate 20, height 5' 10"  (1.778 m), weight 157 lb 12.8 oz (71.578 kg), SpO2 98 %.  PHYSICAL EXAM: General appearance: alert and no distress Neck: no carotid bruit and no JVD Lungs: normal percussion bilaterally Heart: regular rate and rhythm, S1, S2 normal, no murmur, click, rub or gallop Abdomen: soft, non-tender; bowel sounds normal; no masses,  no organomegaly Extremities: extremities normal, atraumatic, no cyanosis or edema Pulses: 2+ and symmetric Skin: Skin  color, texture, turgor normal. No rashes or lesions Neurologic: Grossly normal Psych: Pleasant  LABS: Results for orders placed or performed during the hospital encounter of 09/05/14 (from the past 48 hour(s))  Basic metabolic panel     Status: Abnormal   Collection Time: 09/05/14  7:45 PM  Result Value Ref Range   Sodium 136 135 - 145 mmol/L    Comment: Please note change in reference range.   Potassium 4.5 3.5 - 5.1 mmol/L    Comment: Please note change in reference range.   Chloride 102 96 - 112 mEq/L   CO2 26 19 - 32 mmol/L   Glucose, Bld 167 (H) 70 - 99 mg/dL   BUN 11 6 - 23 mg/dL   Creatinine, Ser 1.05 0.50 - 1.35 mg/dL   Calcium 9.5 8.4 - 10.5 mg/dL   GFR calc non Af Amer 62 (L) >90 mL/min   GFR calc Af Amer 72 (L) >90 mL/min    Comment: (NOTE) The eGFR has been calculated using the CKD EPI equation. This calculation has not been validated in all clinical situations. eGFR's persistently <90 mL/min signify possible Chronic Kidney Disease.    Anion gap 8  5 - 15  CBC with Differential     Status: Abnormal   Collection Time: 09/05/14  7:45 PM  Result Value Ref Range   WBC 5.5 4.0 - 10.5 K/uL   RBC 3.58 (L) 4.22 - 5.81 MIL/uL   Hemoglobin 12.4 (L) 13.0 - 17.0 g/dL   HCT 35.9 (L) 39.0 - 52.0 %   MCV 100.3 (H) 78.0 - 100.0 fL   MCH 34.6 (H) 26.0 - 34.0 pg   MCHC 34.5 30.0 - 36.0 g/dL   RDW 12.4 11.5 - 15.5 %   Platelets 182 150 - 400 K/uL   Neutrophils Relative % 65 43 - 77 %   Neutro Abs 3.6 1.7 - 7.7 K/uL   Lymphocytes Relative 24 12 - 46 %   Lymphs Abs 1.3 0.7 - 4.0 K/uL   Monocytes Relative 7 3 - 12 %   Monocytes Absolute 0.4 0.1 - 1.0 K/uL   Eosinophils Relative 3 0 - 5 %   Eosinophils Absolute 0.2 0.0 - 0.7 K/uL   Basophils Relative 1 0 - 1 %   Basophils Absolute 0.0 0.0 - 0.1 K/uL  I-stat troponin, ED     Status: None   Collection Time: 09/05/14  7:54 PM  Result Value Ref Range   Troponin i, poc 0.00 0.00 - 0.08 ng/mL   Comment 3            Comment: Due  to the release kinetics of cTnI, a negative result within the first hours of the onset of symptoms does not rule out myocardial infarction with certainty. If myocardial infarction is still suspected, repeat the test at appropriate intervals.   Urinalysis, Routine w reflex microscopic     Status: None   Collection Time: 09/05/14  9:51 PM  Result Value Ref Range   Color, Urine YELLOW YELLOW   APPearance CLEAR CLEAR   Specific Gravity, Urine 1.020 1.005 - 1.030   pH 6.0 5.0 - 8.0   Glucose, UA NEGATIVE NEGATIVE mg/dL   Hgb urine dipstick NEGATIVE NEGATIVE   Bilirubin Urine NEGATIVE NEGATIVE   Ketones, ur NEGATIVE NEGATIVE mg/dL   Protein, ur NEGATIVE NEGATIVE mg/dL   Urobilinogen, UA 0.2 0.0 - 1.0 mg/dL   Nitrite NEGATIVE NEGATIVE   Leukocytes, UA NEGATIVE NEGATIVE    Comment: MICROSCOPIC NOT DONE ON URINES WITH NEGATIVE PROTEIN, BLOOD, LEUKOCYTES, NITRITE, OR GLUCOSE <1000 mg/dL.  Troponin I-(serum)     Status: None   Collection Time: 09/06/14  3:30 AM  Result Value Ref Range   Troponin I <0.03 <0.031 ng/mL    Comment:        NO INDICATION OF MYOCARDIAL INJURY. Please note change in reference range.     IMAGING: Dg Chest Port 1 View  09/05/2014   CLINICAL DATA:  Pt to ED via [facility] for evaluation of near syncopal episode prior to arrival. Pt admits to having "blacking out episodes" occasionally due to his medications- pt reports that while washing dishes he began to feel like he was going to black out. Pt wife assisted him the the chair- wife reports pt was alert but not responding to her for a period of 2 minutes. Pt alert and answering questions upon arrival to ED. Admits to some left sided chest pain that started after arrival to exam room. Pt has hx of CGF, GERD, PNA, heart murmur, PVD, Hepatitis, prostate ca, VT, anemia, MI several last one 11/13, asthma, HTN, anginal pain. Non-Smoker.  EXAM: PORTABLE CHEST - 1 VIEW  COMPARISON:  08/28/2013  FINDINGS: Lungs are hypoinflated  without focal consolidation or effusion. Cardiomediastinal silhouette and remainder the exam is unchanged to include node calcified plaque over the aortic arch.  IMPRESSION: No active disease.   Electronically Signed   By: Marin Olp M.D.   On: 09/05/2014 20:11    HOSPITAL DIAGNOSES: Active Problems:   Syncope   IMPRESSION: 1. Syncope 2. Nitrate responsive chest pain  RECOMMENDATION: 1. DDX includes vasovagal (which is most likely, worsened by cardura) or arrhyhtmia (bradycardia or ventricular arrhyhtmia). He is also having some chest pain, including this morning, relieved by nitroglycerin. EKG is difficult to interpret due to LBBB. Troponins have been negative. Would recommend a stress test today. Stop cardura. If stress test is negative, would arrange for outpatient 30 day event monitory and follow-up with Dr. Mare Ferrari.  Time Spent Directly with Patient:  30 minutes  Pixie Casino, MD, [facility] Attending Cardiologist [facility]  HILTY,Kenneth C 09/06/2014, 8:18 AM

## 2014-09-06 NOTE — Progress Notes (Signed)
UR Completed Venecia Mehl Graves-Bigelow, RN,BSN [phone]  

## 2014-09-08 NOTE — Discharge Summary (Signed)
[facility] Discharge Summary  Patient name: [name] Medical record number: [id number] Date of birth: Oct 12, 1924 Age: 81 y.o. Gender: male Date of Admission: 09/05/2014  Date of Discharge: 09/06/14 Admitting Physician: Blane Ohara McDiarmid, MD  Primary Care Provider: Darlin Coco, MD Consultants: Cardiology  Indication for Hospitalization: Syncope  Discharge Diagnoses/Problem List:  Syncope Chest Pain Urinary Retention  Disposition: Discharge Home  Discharge Condition: Stable  Brief Hospital Course:  [name] is an 81yo male who presented to the ED on 09/05/13 following an episode of syncope and chest pain. Had upsetting event earlier in the day of admission. Orthostatic vital signs negative. Recently started on increased dose of Cardura, which is suspected to contribute to syncopal episode. Cardura discontinued during hospitalization.  Cardiology consulted. Troponins negative. Echo showed EF 55-60%. Myoview showed no acute ST or T wave changes on EKG. Recommend 30 day event monitor.  Issues for Follow Up:  - Instructed to pick up event monitor from Cardiology office - Cardura discontinued and contraindicated due to age and syncope. [name] urologist, Dr. [name], contacted and states he instructed [name] to discontinue Cardura and start Flomax at last visit. Prescription printed and given for Flomax along with Coupons. Follow up transition from Cardura to Flomax.  Significant Procedures: Myoview  Significant Labs and Imaging:   Recent Labs Lab 09/05/14 1945  WBC 5.5  HGB 12.4*  HCT 35.9*  PLT 182    Recent Labs Lab 09/05/14 1945  NA 136  K 4.5  CL 102  CO2 26  GLUCOSE 167*  BUN 11  CREATININE 1.05  CALCIUM 9.5  - Troponins negative  Nm Myocar Multi W/spect W/wall Motion / Ef  09/06/2014   CLINICAL DATA:  Chest pain. History of sting the (post median sternotomy and CABG), CHF, hypertension  EXAM: MYOCARDIAL IMAGING  WITH SPECT (REST AND PHARMACOLOGIC-STRESS)  GATED LEFT VENTRICULAR WALL MOTION STUDY  LEFT VENTRICULAR EJECTION FRACTION  TECHNIQUE: Standard myocardial SPECT imaging was performed after resting intravenous injection of 10 mCi Tc-30m sestamibi. Subsequently, intravenous infusion of Lexiscan was performed under the supervision of the Cardiology staff. At peak effect of the drug, 30 mCi Tc-68m sestamibi was injected intravenously and standard myocardial SPECT imaging was performed. Quantitative gated imaging was also performed to evaluate left ventricular wall motion, and estimate left ventricular ejection fraction.  COMPARISON:  Chest radiograph - 09/05/2014  FINDINGS: Raw images: Mild GI activity is seen on both the provided rest and stress images. There is no significant patient motion artifact.  Perfusion: There is a matched area of non perfusion involving the lateral wall of the left ventricle compatible prior infarction. There is a minimal amount of attenuation involving the inferior wall of the left ventricle on the provided rest images which resolves on the provided stress images. No definitive scintigraphic evidence of pharmacologically induced ischemia.  Wall Motion: Mild global hypokinesia with septal dyskinesia, likely the sequela of prior median sternotomy state.  Left Ventricular Ejection Fraction: 58 %  End diastolic volume 678 ml  End systolic volume 52 ml  IMPRESSION: 1. Large territory infarct involving the lateral wall of the left ventricle. 2. No scintigraphic evidence of pharmacologically induced ischemia. 3. Mild global hypokinesia.  Ejection fraction - 58%.   Electronically Signed   By: Sandi Mariscal M.D.   On: 09/06/2014 16:12   Dg Chest Port 1 View  09/05/2014   CLINICAL DATA:  Pt to ED via [facility] for evaluation of near syncopal episode prior to  arrival. Pt admits to having "blacking out episodes" occasionally due to his medications- pt reports that while washing dishes he began to feel like he  was going to black out. Pt wife assisted him the the chair- wife reports pt was alert but not responding to her for a period of 2 minutes. Pt alert and answering questions upon arrival to ED. Admits to some left sided chest pain that started after arrival to exam room. Pt has hx of CGF, GERD, PNA, heart murmur, PVD, Hepatitis, prostate ca, VT, anemia, MI several last one 11/13, asthma, HTN, anginal pain. Non-Smoker.  EXAM: PORTABLE CHEST - 1 VIEW  COMPARISON:  08/28/2013  FINDINGS: Lungs are hypoinflated without focal consolidation or effusion. Cardiomediastinal silhouette and remainder the exam is unchanged to include node calcified plaque over the aortic arch.  IMPRESSION: No active disease.   Electronically Signed   By: Marin Olp M.D.   On: 09/05/2014 20:11   Results/Tests Pending at Time of Discharge: None  Discharge Medications:    Medication List    STOP taking these medications        doxazosin 2 MG tablet  Commonly known as:  CARDURA      TAKE these medications        acetaminophen 500 MG tablet  Commonly known as:  TYLENOL  Take 500 mg by mouth at bedtime. Take at bedtime every night per patient     aspirin 81 MG tablet  Take 81 mg by mouth every evening.     atorvastatin 80 MG tablet  Commonly known as:  LIPITOR  Take 80 mg by mouth every evening.     budesonide-formoterol 80-4.5 MCG/ACT inhaler  Commonly known as:  SYMBICORT  Inhale 2 puffs into the lungs 2 (two) times daily.     carbamazepine 300 MG 12 hr capsule  Commonly known as:  CARBATROL  Take 300 mg by mouth 2 (two) times daily.     clopidogrel 75 MG tablet  Commonly known as:  PLAVIX  Take 75 mg by mouth daily.     DSS 100 MG Caps  Take 100 mg by mouth 2 (two) times daily.     finasteride 5 MG tablet  Commonly known as:  PROSCAR  Take 5 mg by mouth daily.     furosemide 40 MG tablet  Commonly known as:  LASIX  Take 40 mg by mouth every morning.     ipratropium 17 MCG/ACT inhaler  Commonly  known as:  ATROVENT HFA  Inhale 2 puffs into the lungs 2 (two) times daily as needed for wheezing.     isosorbide mononitrate 60 MG 24 hr tablet  Commonly known as:  IMDUR  Take 30-60 mg by mouth daily. Take 90mg  total a day take a whole 60mg  tablet then split one in half to take with the 60mg      metoprolol succinate 25 MG 24 hr tablet  Commonly known as:  TOPROL-XL  Take 12.5 mg by mouth daily. 1/2  tablet daily     NITROSTAT 0.4 MG SL tablet  Generic drug:  nitroGLYCERIN  PLACE 1 TABLET UNDER THE TONGUE EVERY 5 MINUTES ASDIRECTED BY PHYSICIAN     potassium chloride SA 20 MEQ tablet  Commonly known as:  K-DUR,KLOR-CON  Take 20 mEq by mouth daily.     ranitidine 150 MG tablet  Commonly known as:  ZANTAC  TAKE 1 TABLET BY MOUTH TWICE DAILY.     sertraline 100 MG tablet  Commonly known as:  ZOLOFT  Take 150 mg by mouth See admin instructions. Take 150mg  total every day. Split the 100mg  in half to make total of 150mg      tamsulosin 0.4 MG Caps capsule  Commonly known as:  FLOMAX  Take 1 capsule (0.4 mg total) by mouth daily.     temazepam 15 MG capsule  Commonly known as:  RESTORIL  Take one capsule by mouth every night at bedtime as needed for sleep        Discharge Instructions: Please refer to Patient Instructions section of EMR for full details.  Patient was counseled important signs and symptoms that should prompt return to medical care, changes in medications, dietary instructions, activity restrictions, and follow up appointments.   Follow-Up Appointments:     Follow-up Information    Follow up with [facility] On 09/11/2014.   Why:  [address] @ 12 noon to pick up your event monitor   Contact information:   [place] [phone]      Follow up with Darlin Coco, MD On 10/02/2014.   Specialty:  Cardiology   Why:  @ 3:15pm    Contact information:   [place] [address] [place] [address] [phone] [place], [place] 09/08/2014, 1:01 PM PGY-1, [place]

## 2014-09-11 ENCOUNTER — Encounter: Payer: Self-pay | Admitting: *Deleted

## 2014-09-11 ENCOUNTER — Encounter (INDEPENDENT_AMBULATORY_CARE_PROVIDER_SITE_OTHER): Payer: Medicare Other

## 2014-09-11 DIAGNOSIS — R55 Syncope and collapse: Secondary | ICD-10-CM

## 2014-09-11 NOTE — Progress Notes (Signed)
Patient ID: [name], male   DOB: 12-07-1924, 81 y.o.   MRN: [id number] [employer] 30 day cardiac event monitor applied to patient.

## 2014-10-02 ENCOUNTER — Encounter: Payer: Self-pay | Admitting: Cardiology

## 2014-10-02 ENCOUNTER — Ambulatory Visit (INDEPENDENT_AMBULATORY_CARE_PROVIDER_SITE_OTHER): Payer: Medicare Other | Admitting: Cardiology

## 2014-10-02 VITALS — BP 124/62 | HR 56 | Ht 70.0 in | Wt 162.0 lb

## 2014-10-02 DIAGNOSIS — I447 Left bundle-branch block, unspecified: Secondary | ICD-10-CM

## 2014-10-02 DIAGNOSIS — I259 Chronic ischemic heart disease, unspecified: Secondary | ICD-10-CM

## 2014-10-02 DIAGNOSIS — R55 Syncope and collapse: Secondary | ICD-10-CM

## 2014-10-02 NOTE — Progress Notes (Signed)
Cardiology Office Note   Date:  10/02/2014   ID:  TILDEN BROZ, DOB [birth year], MRN [id number]  PCP:  Darlin Coco, MD  Cardiologist:   Darlin Coco, MD   No chief complaint on file.     History of Present Illness: [name] is a 81 y.o. male who presents for a post hospital office visit.  This pleasant 81 year old gentleman is seen back for a post hospital followup office visit.  He has a history of extensive ischemic heart disease. He had coronary artery bypass graft surgery in 1986. He had a redo CABG in 1993. He had PTCA with stent in October 2008. He had catheter with a drug-eluting stent in February 2010. He had a small non-Q MI in August 2010.   He presented to the emergency room on 07/15/12 after falling at home. While in the emergency room he developed chest pain followed by ventricular tachycardia requiring cardioversion. He was taken emergently to the catheter lab treated with a bare-metal stent to the mid LAD.  He was treated initially with amiodarone which was subsequently stopped prior to discharge. His echocardiogram November 2013 showed ejection fraction 40-45% and moderate left atrial enlargement.  He has been treated by urologist Dr. [name] for right hydronephrosis secondary to renal calculus in the right ureter. . Since last visit he was hospitalized on 08/28/13 for acute right hip fracture suffered in a mechanical fall. He was getting out of the limousine following his sister-in-law's funeral when his knee buckled and gave out and he fell to the ground. Dr. [name] is his orthopedist. He tolerated the surgery okay from a cardiac standpoint with general anesthesia. He states he did receive 2 units of packed cells in the hospital for severe anemia. His recent CBC shows improvement in hemoglobin back toward normal. He is no longer taking iron tablets. He continues to have a lot of discomfort in the right hip and had a recent shot in the right hip by his  orthopedist  The patient presented to the ED on 09/05/14 following an episode of syncope and chest pain. Had upsetting event earlier in the day of admission. Orthostatic vital signs negative.  He had recently started on increased dose of Cardura, which is suspected to contribute to syncopal episode. Cardura discontinued during hospitalization.  He is now on Flomax instead.  Cardiology consulted. Troponins negative. Echo showed EF 55-60%. Myoview showed no acute ST or T wave changes on EKG. Myoview showed an ejection fraction of 58% and there was no reversible ischemia.  There was a large scar present.  The patient is currently wearing a 30 day heart monitor. Since discharge home he has felt well.  No recent chest pain.  Energy level is satisfactory.  Past Medical History  Diagnosis Date  . Systolic CHF     EF 29-51% by echo 07/18/12  . Dyslipidemia (high LDL; low HDL)   . Coronary artery disease     s/p CABG; Anterolateral STEMI 06/2012 s/p BMS to mid LAD   . Heart murmur   . Peripheral vascular disease     thrombus- in leg- many yrs. ago- ?R- coumadin x1 mth  . Pneumonia     hosp. long time ago   . COPD (chronic obstructive pulmonary disease)     dc'd on home O2 06/2012  . Blood transfusion     post CABG  . Hepatitis     hepatitis- long time ago, occupational contamination   . GERD (gastroesophageal reflux disease)   .  Cancer     prostate - Ca- radiation therapy- 2002  . Peripheral neuropathy   . Hiatal hernia   . Arthritis     hnp, lumbar, ankle (foot drop-R), wears a splint, arthritis - "all over", low bone density   . Depression     uses Zoloft   . VT (ventricular tachycardia) 06/2012    in the setting of STEMI  . Bacteremia 06/2012    in the setting of UTI/Prostatits, Blood Cx E.Coli tx w/ Bactrim  . Anemia   . Glucose intolerance (impaired glucose tolerance) 06/2012    A1C 5.8  . Prostatitis   . Myocardial infarction     several, the last was 06/2012  . H/O cardiac  arrest     x2  . Anginal pain   . Hypertension   . Asthma   . History of kidney stones     Past Surgical History  Procedure Laterality Date  . Coronary artery bypass graft  1986 and 1993  . Ptca  2008  . Cardiac catheterization      2010- last cath, two stents at that time  . Eye surgery      cataracts - bilateral , IOL  . Hernia repair      bilateral inguiinal repair   . Fracture surgery      heel- crushed -2002,  (hardware)   . Joint replacement      R knee- arthroscopic, x2, R great toe- 2 surgeries- /w hardware & thenlater fused by Dr. Sharol Given    . Rhinoplasty  [phone]    as a teen, followed by same procedure at 1970's   . Lumbar laminectomy/decompression microdiscectomy  08/10/2011    Procedure: LUMBAR LAMINECTOMY/DECOMPRESSION MICRODISCECTOMY;  Surgeon: Cooper Render Pool;  Location: [facility];  Service: Neurosurgery;  Laterality: Right;  RIGHT Lumbar five-sacral one LAMINECTOMY, MICRODISCECTOMY  . Lumbar wound debridement  08/22/2011    Procedure: LUMBAR WOUND DEBRIDEMENT;  Surgeon: Eustace Moore;  Location: [facility];  Service: Neurosurgery;  Laterality: N/A;  Repair of CSF Leak requiring laminectomy  . Coronary angioplasty with stent placement  X 4  . Cystoscopy w/ ureteral stent placement Right 02/28/2013    Procedure: CYSTOSCOPY WITH RETROGRADE PYELOGRAM/URETERAL STENT PLACEMENT;  Surgeon: Dutch Gray, MD;  Location: [facility] ORS;  Service: Urology;  Laterality: Right;  . Tonsillectomy  1940  . Appendectomy  1943  . Hammer toe surgery      right little toe  . Transurethral resection of prostate      x2  . Foot surgery Left 2000    heel fusion  . Toe surgery Right 2002  . Cystoscopy with ureteroscopy and stent placement Right 03/23/2013    Procedure: RIGHT URETEROSCOPY, LASER LITHO AND STENT PLACEMENT;  Surgeon: Fredricka Bonine, MD;  Location: [facility];  Service: Urology;  Laterality: Right;  . Holmium laser application Right 4/66/5993    Procedure: HOLMIUM LASER  APPLICATION;  Surgeon: Fredricka Bonine, MD;  Location: [facility];  Service: Urology;  Laterality: Right;  . Intramedullary (im) nail intertrochanteric Right 08/29/2013    Procedure: INTRAMEDULLARY (IM) NAIL INTERTROCHANTRIC;  Surgeon: Marianna Payment, MD;  Location: [facility];  Service: Orthopedics;  Laterality: Right;  . Left and right heart catheterization with coronary angiogram N/A 07/15/2012    Procedure: LEFT AND RIGHT HEART CATHETERIZATION WITH CORONARY ANGIOGRAM;  Surgeon: Jettie Booze, MD;  Location: [facility] CATH LAB;  Service: Cardiovascular;  Laterality: N/A;  . Percutaneous coronary stent intervention (pci-s) N/A  07/15/2012    Procedure: PERCUTANEOUS CORONARY STENT INTERVENTION (PCI-S);  Surgeon: Jettie Booze, MD;  Location: [facility] CATH LAB;  Service: Cardiovascular;  Laterality: N/A;     Current Outpatient Prescriptions  Medication Sig Dispense Refill  . acetaminophen (TYLENOL) 500 MG tablet Take 500 mg by mouth at bedtime. Take at bedtime every night per patient    . aspirin 81 MG tablet Take 81 mg by mouth every evening.  30 tablet 6  . atorvastatin (LIPITOR) 80 MG tablet Take 80 mg by mouth every evening.  30 tablet 6  . budesonide-formoterol (SYMBICORT) 80-4.5 MCG/ACT inhaler Inhale 2 puffs into the lungs 2 (two) times daily.    . carbamazepine (CARBATROL) 300 MG 12 hr capsule Take 300 mg by mouth 2 (two) times daily.     . clopidogrel (PLAVIX) 75 MG tablet Take 75 mg by mouth daily.      Mariane Baumgarten Sodium (DSS) 100 MG CAPS Take 100 mg by mouth 2 (two) times daily. (Patient taking differently: Take 100 mg by mouth daily. )    . finasteride (PROSCAR) 5 MG tablet Take 5 mg by mouth daily.     . furosemide (LASIX) 40 MG tablet Take 40 mg by mouth every morning.    [name] ipratropium (ATROVENT HFA) 17 MCG/ACT inhaler Inhale 2 puffs into the lungs 2 (two) times daily as needed for wheezing.     . isosorbide mononitrate (IMDUR) 60 MG 24 hr tablet Take 30-60 mg by mouth daily.  Take 90mg  total a day take a whole 60mg  tablet then split one in half to take with the 60mg     . metoprolol succinate (TOPROL-XL) 25 MG 24 hr tablet Take 12.5 mg by mouth daily. 1/2  tablet daily    . NITROSTAT 0.4 MG SL tablet PLACE 1 TABLET UNDER THE TONGUE EVERY 5 MINUTES ASDIRECTED BY PHYSICIAN 25 tablet 11  . potassium chloride SA (K-DUR,KLOR-CON) 20 MEQ tablet Take 20 mEq by mouth daily.     . ranitidine (ZANTAC) 150 MG tablet TAKE 1 TABLET BY MOUTH TWICE DAILY. 180 tablet 0  . sertraline (ZOLOFT) 100 MG tablet Take 150 mg by mouth See admin instructions. Take 150mg  total every day. Split the 100mg  in half to make total of 150mg     . tamsulosin (FLOMAX) 0.4 MG CAPS capsule Take 1 capsule (0.4 mg total) by mouth daily. 30 capsule 0  . temazepam (RESTORIL) 15 MG capsule Take one capsule by mouth every night at bedtime as needed for sleep 30 capsule 5   No current facility-administered medications for this visit.    Allergies:   Review of patient's allergies indicates no known allergies.    Social History:  The patient  reports that he has never smoked. He has never used smokeless tobacco. He reports that he does not drink alcohol or use illicit drugs.   Family History:  The patient's family history includes Addison's disease in his father; Heart attack in his mother and sister. There is no history of Anesthesia problems, Hypotension, Malignant hyperthermia, or Pseudochol deficiency.    ROS:  Please see the history of present illness.   Otherwise, review of systems are positive for none.   All other systems are reviewed and negative.    PHYSICAL EXAM: VS:  BP 124/62 mmHg  Pulse 56  Ht 5\' 10"  (1.778 m)  Wt 162 lb (73.483 kg)  BMI 23.24 kg/m2 , BMI Body mass index is 23.24 kg/(m^2). GEN: Well nourished, well developed, in no acute  distress HEENT: normal Neck: no JVD, carotid bruits, or masses Cardiac: RRR; no murmurs, rubs, or gallops,no edema  Respiratory:  clear to auscultation  bilaterally, normal work of breathing GI: soft, nontender, nondistended, + BS MS: no deformity or atrophy Skin: warm and dry, no rash Neuro:  Strength and sensation are intact Psych: euthymic mood, full affect   EKG:  EKG is not ordered today.    Recent Labs: 08/12/2014: ALT 19 09/05/2014: BUN 11; Creatinine 1.05; Hemoglobin 12.4*; Platelets 182; Potassium 4.5; Sodium 136    Lipid Panel    Component Value Date/Time   CHOL 174 08/12/2014 0835   TRIG 59.0 08/12/2014 0835   HDL 63.40 08/12/2014 0835   CHOLHDL 3 08/12/2014 0835   VLDL 11.8 08/12/2014 0835   LDLCALC 99 08/12/2014 0835      Wt Readings from Last 3 Encounters:  10/02/14 162 lb (73.483 kg)  09/06/14 157 lb 12.8 oz (71.578 kg)  08/14/14 161 lb 6.4 oz (73.211 kg)      Other studies Reviewed: Additional studies/ records that were reviewed today include: Echocardiogram on 09/06/14 Myoview on 09/06/14 Review of the above records demonstrates: No ischemia, and improvement in ejection fraction   ASSESSMENT AND PLAN:  1. Ischemic heart disease status post CABG in 1986, redo CABG in 1993, PTCA with stent in 2008, drug-eluting stent in 2010, bare-metal stent to the mid LAD in November 2013. 2. left bundle-branch block 3. ischemic cardiomyopathy with ejection fraction of 50-55% by echocardiogram in June 2014.  Ejection fraction 58% by Myoview in January 2016 4. Status post acute right hip fracture secondary to a mechanical fall in December 2014. 5. osteoarthritis with painful right knee 6. history of renal calculus with right hydronephrosis followed by Dr. [name] 7. Dyslipidemia 8. orthostatic hypotension 9. Peripheral neuropathy, on Carbatrol from [facility]   Current medicines are reviewed at length with the patient today.  The patient does not have concerns regarding medicines.  The following changes have been made:  no change  Labs/ tests ordered today include: None  No orders of the defined types were  placed in this encounter.     Disposition:   FU with Dr. Mare Ferrari in 3 months as previously scheduled.  Continue current medication.  Await results of his 30 day monitor.   Signed, Darlin Coco, MD  10/02/2014 4:33 PM    [facility] [place], [place], [place]  [address] Phone: [phone]; Fax: [phone]

## 2014-10-02 NOTE — Patient Instructions (Signed)
Your physician recommends that you continue on your current medications as directed. Please refer to the Current Medication list given to you today.   Keep your April appointment

## 2014-10-11 ENCOUNTER — Other Ambulatory Visit: Payer: Self-pay

## 2014-10-11 MED ORDER — RANITIDINE HCL 150 MG PO TABS: 150.0000 mg | ORAL_TABLET | Freq: Two times a day (BID) | ORAL | Status: DC

## 2014-10-17 ENCOUNTER — Other Ambulatory Visit: Payer: Self-pay | Admitting: Cardiology

## 2014-10-25 ENCOUNTER — Telehealth: Payer: Self-pay | Admitting: *Deleted

## 2014-10-25 NOTE — Telephone Encounter (Signed)
Event monitor reviewed by  Dr. Mare Ferrari  Interpretation:  NSR, frequent PAC's Advised patient

## 2014-12-16 ENCOUNTER — Other Ambulatory Visit (INDEPENDENT_AMBULATORY_CARE_PROVIDER_SITE_OTHER): Payer: Medicare Other | Admitting: *Deleted

## 2014-12-16 DIAGNOSIS — I10 Essential (primary) hypertension: Secondary | ICD-10-CM

## 2014-12-16 DIAGNOSIS — E785 Hyperlipidemia, unspecified: Secondary | ICD-10-CM | POA: Diagnosis not present

## 2014-12-16 LAB — HEPATIC FUNCTION PANEL
ALT: 14 U/L (ref 0–53)
AST: 21 U/L (ref 0–37)
Albumin: 3.5 g/dL (ref 3.5–5.2)
Alkaline Phosphatase: 83 U/L (ref 39–117)
BILIRUBIN DIRECT: 0.1 mg/dL (ref 0.0–0.3)
BILIRUBIN TOTAL: 0.3 mg/dL (ref 0.2–1.2)
Total Protein: 6.7 g/dL (ref 6.0–8.3)

## 2014-12-16 LAB — BASIC METABOLIC PANEL
BUN: 14 mg/dL (ref 6–23)
CO2: 29 mEq/L (ref 19–32)
CREATININE: 0.9 mg/dL (ref 0.40–1.50)
Calcium: 9.9 mg/dL (ref 8.4–10.5)
Chloride: 103 mEq/L (ref 96–112)
GFR: 84.68 mL/min (ref >60.00)
Glucose, Bld: 95 mg/dL (ref 70–99)
Potassium: 4.1 mEq/L (ref 3.5–5.1)
Sodium: 136 mEq/L (ref 135–145)

## 2014-12-16 LAB — LIPID PANEL
CHOLESTEROL: 158 mg/dL (ref 0–200)
HDL: 49.1 mg/dL (ref >39.00)
LDL Cholesterol: 92 mg/dL (ref 0–99)
NonHDL: 108.9
Total CHOL/HDL Ratio: 3
Triglycerides: 86 mg/dL (ref 0.0–149.0)
VLDL: 17.2 mg/dL (ref 0.0–40.0)

## 2014-12-16 NOTE — Progress Notes (Signed)
Quick Note:  Please make copy of labs for patient visit. ______ 

## 2014-12-19 ENCOUNTER — Encounter: Payer: Self-pay | Admitting: Cardiology

## 2014-12-19 ENCOUNTER — Ambulatory Visit (INDEPENDENT_AMBULATORY_CARE_PROVIDER_SITE_OTHER): Payer: Medicare Other | Admitting: Cardiology

## 2014-12-19 VITALS — BP 128/74 | HR 69 | Ht 70.0 in | Wt 163.0 lb

## 2014-12-19 DIAGNOSIS — I10 Essential (primary) hypertension: Secondary | ICD-10-CM | POA: Diagnosis not present

## 2014-12-19 DIAGNOSIS — I447 Left bundle-branch block, unspecified: Secondary | ICD-10-CM | POA: Diagnosis not present

## 2014-12-19 DIAGNOSIS — I259 Chronic ischemic heart disease, unspecified: Secondary | ICD-10-CM

## 2014-12-19 NOTE — Patient Instructions (Signed)
Medication Instructions:  Your physician recommends that you continue on your current medications as directed. Please refer to the Current Medication list given to you today.  Labwork: NONE  Testing/Procedures: NONE  Follow-Up: Your physician recommends that you schedule a follow-up appointment in: 4 months with fasting labs (lp/bmet/hfp/CBC) AND EKG

## 2014-12-19 NOTE — Progress Notes (Signed)
Cardiology Office Note   Date:  12/19/2014   ID:  [name], DOB [birth year], MRN [id number]  PCP:  Darlin Coco, MD  Cardiologist:   Darlin Coco, MD   No chief complaint on file.     History of Present Illness: [name] is a 81 y.o. male who presents for scheduled follow-up office visit He has a history of extensive ischemic heart disease. He had coronary artery bypass graft surgery in 1986. He had a redo CABG in 1993. He had PTCA with stent in October 2008. He had catheter with a drug-eluting stent in February 2010. He had a small non-Q MI in August 2010.  He presented to the emergency room on 07/15/12 after falling at home. While in the emergency room he developed chest pain followed by ventricular tachycardia requiring cardioversion. He was taken emergently to the catheter lab treated with a bare-metal stent to the mid LAD. He was treated initially with amiodarone which was subsequently stopped prior to discharge. His echocardiogram November 2013 showed ejection fraction 40-45% and moderate left atrial enlargement.  He has been treated by urologist Dr. [name] for right hydronephrosis secondary to renal calculus in the right ureter. . Since last visit he was hospitalized on 08/28/13 for acute right hip fracture suffered in a mechanical fall. He was getting out of the limousine following his sister-in-law's funeral when his knee buckled and gave out and he fell to the ground. Dr. [name] is his orthopedist. He tolerated the surgery okay from a cardiac standpoint with general anesthesia. He states he did receive 2 units of packed cells in the hospital for severe anemia. His recent CBC shows improvement in hemoglobin back toward normal. He is no longer taking iron tablets. He continues to have a lot of discomfort in the right hip and had a recent shot in the right hip by his orthopedist  The patient presented to the ED on 09/05/14 following an episode of syncope and  chest pain. Had upsetting event earlier in the day of admission. Orthostatic vital signs negative. He had recently started on increased dose of Cardura, which is suspected to contribute to syncopal episode. Cardura discontinued during hospitalization. He is now on Flomax instead.  Cardiology consulted. Troponins negative. Echo showed EF 55-60%. Myoview showed no acute ST or T wave changes on EKG. Myoview showed an ejection fraction of 58% and there was no reversible ischemia. There was a large scar present. The patient wore  a 30 day heart monitor.  No malignant arrhythmias were detected on the 30 day monitor.  Since discharge home he has felt well. No recent chest pain. Energy level is satisfactory. He has had 2 recent falls.  One occurred as he was opening his screen door and the door knocked him backwards and crossed him to stumble.  The second episode occurred last week where he fell off his computer chair.  Her that day he was running a fever to 101 and was having diarrhea.  He was more short of breath for the next several days.  He did not seek medical attention.  His oxygen saturation by his home meter dropped to 87%.  The patient took Tylenol and gradually improved.  Feels somewhat weak however.  He feels that he had a bad chest cold and virus.  It is likely that the weakness from the virus caused him to fall off the computer chair  Past Medical History  Diagnosis Date  . Systolic CHF  EF 40-45% by echo 07/18/12  . Dyslipidemia (high LDL; low HDL)   . Coronary artery disease     s/p CABG; Anterolateral STEMI 06/2012 s/p BMS to mid LAD   . Heart murmur   . Peripheral vascular disease     thrombus- in leg- many yrs. ago- ?R- coumadin x1 mth  . Pneumonia     hosp. long time ago   . COPD (chronic obstructive pulmonary disease)     dc'd on home O2 06/2012  . Blood transfusion     post CABG  . Hepatitis     hepatitis- long time ago, occupational contamination   . GERD  (gastroesophageal reflux disease)   . Cancer     prostate - Ca- radiation therapy- 2002  . Peripheral neuropathy   . Hiatal hernia   . Arthritis     hnp, lumbar, ankle (foot drop-R), wears a splint, arthritis - "all over", low bone density   . Depression     uses Zoloft   . VT (ventricular tachycardia) 06/2012    in the setting of STEMI  . Bacteremia 06/2012    in the setting of UTI/Prostatits, Blood Cx E.Coli tx w/ Bactrim  . Anemia   . Glucose intolerance (impaired glucose tolerance) 06/2012    A1C 5.8  . Prostatitis   . Myocardial infarction     several, the last was 06/2012  . H/O cardiac arrest     x2  . Anginal pain   . Hypertension   . Asthma   . History of kidney stones     Past Surgical History  Procedure Laterality Date  . Coronary artery bypass graft  1986 and 1993  . Ptca  2008  . Cardiac catheterization      2010- last cath, two stents at that time  . Eye surgery      cataracts - bilateral , IOL  . Hernia repair      bilateral inguiinal repair   . Fracture surgery      heel- crushed -2002,  (hardware)   . Joint replacement      R knee- arthroscopic, x2, R great toe- 2 surgeries- /w hardware & thenlater fused by Dr. Sharol Given    . Rhinoplasty  1093,2355    as a teen, followed by same procedure at 1970's   . Lumbar laminectomy/decompression microdiscectomy  08/10/2011    Procedure: LUMBAR LAMINECTOMY/DECOMPRESSION MICRODISCECTOMY;  Surgeon: Cooper Render Pool;  Location: [facility];  Service: Neurosurgery;  Laterality: Right;  RIGHT Lumbar five-sacral one LAMINECTOMY, MICRODISCECTOMY  . Lumbar wound debridement  08/22/2011    Procedure: LUMBAR WOUND DEBRIDEMENT;  Surgeon: Eustace Moore;  Location: [facility];  Service: Neurosurgery;  Laterality: N/A;  Repair of CSF Leak requiring laminectomy  . Coronary angioplasty with stent placement  X 4  . Cystoscopy w/ ureteral stent placement Right 02/28/2013    Procedure: CYSTOSCOPY WITH RETROGRADE PYELOGRAM/URETERAL STENT  PLACEMENT;  Surgeon: Dutch Gray, MD;  Location: [facility];  Service: Urology;  Laterality: Right;  . Tonsillectomy  1940  . Appendectomy  1943  . Hammer toe surgery      right little toe  . Transurethral resection of prostate      x2  . Foot surgery Left 2000    heel fusion  . Toe surgery Right 2002  . Cystoscopy with ureteroscopy and stent placement Right 03/23/2013    Procedure: RIGHT URETEROSCOPY, LASER LITHO AND STENT PLACEMENT;  Surgeon: Fredricka Bonine, MD;  Location: [facility];  Service: Urology;  Laterality: Right;  . Holmium laser application Right 2/48/2500    Procedure: HOLMIUM LASER APPLICATION;  Surgeon: Fredricka Bonine, MD;  Location: [facility];  Service: Urology;  Laterality: Right;  . Intramedullary (im) nail intertrochanteric Right 08/29/2013    Procedure: INTRAMEDULLARY (IM) NAIL INTERTROCHANTRIC;  Surgeon: Marianna Payment, MD;  Location: [facility];  Service: Orthopedics;  Laterality: Right;  . Left and right heart catheterization with coronary angiogram N/A 07/15/2012    Procedure: LEFT AND RIGHT HEART CATHETERIZATION WITH CORONARY ANGIOGRAM;  Surgeon: Jettie Booze, MD;  Location: [facility] CATH LAB;  Service: Cardiovascular;  Laterality: N/A;  . Percutaneous coronary stent intervention (pci-s) N/A 07/15/2012    Procedure: PERCUTANEOUS CORONARY STENT INTERVENTION (PCI-S);  Surgeon: Jettie Booze, MD;  Location: [facility] CATH LAB;  Service: Cardiovascular;  Laterality: N/A;     Current Outpatient Prescriptions  Medication Sig Dispense Refill  . acetaminophen (TYLENOL) 500 MG tablet Take 500 mg by mouth at bedtime. Take at bedtime every night per patient    . aspirin 81 MG tablet Take 81 mg by mouth every evening.  30 tablet 6  . atorvastatin (LIPITOR) 80 MG tablet Take 80 mg by mouth every evening.  30 tablet 6  . budesonide-formoterol (SYMBICORT) 80-4.5 MCG/ACT inhaler Inhale 2 puffs into the lungs 2 (two) times daily.    . carbamazepine (CARBATROL) 300 MG 12 hr  capsule Take 300 mg by mouth 2 (two) times daily.     . clopidogrel (PLAVIX) 75 MG tablet Take 75 mg by mouth daily.      [name] docusate sodium (COLACE) 100 MG capsule Take 100 mg by mouth daily.    . finasteride (PROSCAR) 5 MG tablet Take 5 mg by mouth daily.     . furosemide (LASIX) 40 MG tablet Take 40 mg by mouth every morning.    [name] ipratropium (ATROVENT HFA) 17 MCG/ACT inhaler Inhale 2 puffs into the lungs 2 (two) times daily as needed for wheezing.     . isosorbide mononitrate (IMDUR) 60 MG 24 hr tablet Take 30-60 mg by mouth daily. Take 90mg  total a day take a whole 60mg  tablet then split one in half to take with the 60mg     . metoprolol succinate (TOPROL-XL) 25 MG 24 hr tablet Take 12.5 mg by mouth daily. 1/2  tablet daily    . NITROSTAT 0.4 MG SL tablet PLACE 1 TABLET UNDER THE TONGUE EVERY 5 MINUTES ASDIRECTED BY PHYSICIAN 25 tablet 11  . potassium chloride SA (K-DUR,KLOR-CON) 20 MEQ tablet Take 20 mEq by mouth daily.     . ranitidine (ZANTAC) 150 MG tablet Take 1 tablet (150 mg total) by mouth 2 (two) times daily. 180 tablet 3  . sertraline (ZOLOFT) 100 MG tablet Take 150 mg by mouth See admin instructions. Take 150mg  total every day. Split the 100mg  in half to make total of 150mg     . tamsulosin (FLOMAX) 0.4 MG CAPS capsule Take 1 capsule (0.4 mg total) by mouth daily. 30 capsule 0  . temazepam (RESTORIL) 15 MG capsule Take one capsule by mouth every night at bedtime as needed for sleep 30 capsule 5   No current facility-administered medications for this visit.    Allergies:   Review of patient's allergies indicates no known allergies.    Social History:  The patient  reports that he has never smoked. He has never used smokeless tobacco. He reports that he does not drink alcohol or use illicit drugs.  Family History:  The patient's family history includes Addison's disease in his father; Heart attack in his mother and sister. There is no history of Anesthesia problems, Hypotension,  Malignant hyperthermia, or Pseudochol deficiency.    ROS:  Please see the history of present illness.   Otherwise, review of systems are positive for none.   All other systems are reviewed and negative.    PHYSICAL EXAM: VS:  BP 128/74 mmHg  Pulse 69  Ht 5\' 10"  (1.778 m)  Wt 163 lb (73.936 kg)  BMI 23.39 kg/m2 , BMI Body mass index is 23.39 kg/(m^2). GEN: Well nourished, well developed, in no acute distress HEENT: normal Neck: no JVD, carotid bruits, or masses Cardiac: RRR; no murmurs, rubs, or gallops,no edema  Respiratory:  clear to auscultation bilaterally, normal work of breathing GI: soft, nontender, nondistended, + BS MS: no deformity or atrophy Skin: warm and dry, no rash Neuro:  Strength and sensation are intact Psych: euthymic mood, full affect   EKG:  EKG is not ordered today.    Recent Labs: 09/05/2014: Hemoglobin 12.4*; Platelets 182 12/16/2014: ALT 14; BUN 14; Creatinine 0.90; Potassium 4.1; Sodium 136    Lipid Panel    Component Value Date/Time   CHOL 158 12/16/2014 0836   TRIG 86.0 12/16/2014 0836   HDL 49.10 12/16/2014 0836   CHOLHDL 3 12/16/2014 0836   VLDL 17.2 12/16/2014 0836   LDLCALC 92 12/16/2014 0836      Wt Readings from Last 3 Encounters:  12/19/14 163 lb (73.936 kg)  10/02/14 162 lb (73.483 kg)  09/06/14 157 lb 12.8 oz (71.578 kg)      ASSESSMENT AND PLAN: 1. Ischemic heart disease status post CABG in 1986, redo CABG in 1993, PTCA with stent in 2008, drug-eluting stent in 2010, bare-metal stent to the mid LAD in November 2013. 2. left bundle-branch block 3. ischemic cardiomyopathy with ejection fraction of 50-55% by echocardiogram in June 2014. Ejection fraction 58% by Myoview in January 2016 4. Status post acute right hip fracture secondary to a mechanical fall in December 2014. 5. osteoarthritis with painful right knee 6. history of renal calculus with right hydronephrosis followed by Dr. [name] 7. Dyslipidemia 8. orthostatic  hypotension 9. Peripheral neuropathy, on Carbatrol from [facility] 10.  Recent severe respiratory viral illness, improved   Current medicines are reviewed at length with the patient today. The patient does not have concerns regarding medicines.  The following changes have been made: no change  Labs/ tests ordered today include: None  No orders of the defined types were placed in this encounter.    Current medicines are reviewed at length with the patient today.  The patient does not have concerns regarding medicines.  The following changes have been made:  no change  Labs/ tests ordered today include:   Orders Placed This Encounter  Procedures  . Lipid panel  . Basic metabolic panel  . Hepatic function panel  . CBC with Differential/Platelet    Disposition: Continue same medication.  If he has a future upper respiratory infection notes suggest that he try adding some generic Mucinex to his therapy.  Recheck in 4 months for office visit EKG CBC lipid panel hepatic function panel and basal metabolic panel  Signed, Darlin Coco, MD  12/19/2014 1:21 PM    [facility] [place], [place], [place]  [address] Phone: [phone]; Fax: [phone]

## 2015-01-06 ENCOUNTER — Other Ambulatory Visit: Payer: Self-pay | Admitting: Cardiology

## 2015-01-08 ENCOUNTER — Encounter: Payer: Self-pay | Admitting: Family Medicine

## 2015-01-08 ENCOUNTER — Ambulatory Visit (INDEPENDENT_AMBULATORY_CARE_PROVIDER_SITE_OTHER): Payer: Medicare Other | Admitting: Family Medicine

## 2015-01-08 VITALS — BP 117/57 | HR 71 | Temp 98.0°F | Ht 70.0 in | Wt 165.4 lb

## 2015-01-08 DIAGNOSIS — K4090 Unilateral inguinal hernia, without obstruction or gangrene, not specified as recurrent: Secondary | ICD-10-CM

## 2015-01-08 NOTE — Assessment & Plan Note (Signed)
Recurrent - nonincarcerated.  Has appointment with surgery set up to discuss management.  If surgical may need cardiac evaluation given his extensive history

## 2015-01-08 NOTE — Patient Instructions (Signed)
Good to see you today!  Thanks for coming in.  I will be happy to refer you to Dr Dalbert Batman  If the hernia becomes very tender or red or you have lots of vomiting then go to the ER  Consider taking the sena 1 tab only as needed.  Do take the colace every day

## 2015-01-08 NOTE — Progress Notes (Signed)
   Subjective:    Patient ID: [name], male    DOB: 23-Jul-1925, 81 y.o.   MRN: [id number]  HPI  Hernia Noticed a bulge in his left inguinal area several weeks ago - not sudden and no pain.  Has intermittent bouts of constipation and then freq bowel movement if takes senna.  No vomiting no bleeding  PMH - bilateral inguinal hernia repair - years ago cant remember exactly when  Has appointment to see Dr Dalbert Batman next week Followed closely by Dr Mare Ferrari   Review of Systems     Objective:   Physical Exam Groin - noticeable bulge in left groin area - nontender non red not reducible but able to decrease size Abdomen - superior ventral hernia nontender reducible      Assessment & Plan:

## 2015-01-15 ENCOUNTER — Telehealth: Payer: Self-pay | Admitting: Cardiology

## 2015-01-15 NOTE — Telephone Encounter (Signed)
New Message   Patient needs Temacepam refilled and needs to speak to a nurse about getting it refilled. Please give patient a call.

## 2015-01-15 NOTE — Telephone Encounter (Signed)
Pt calling Melinda LPN and Dr Mare Ferrari to request refill on his medication Temazepam 15 mg po at bedtime.  Pt states that he will run out of this medication next Tuesday.  Pt states that he was instructed to always call Rip Harbour when his refills of this med get low.  Informed the pt that Rip Harbour and Dr Mare Ferrari are currently out of the office today, but I will route this message to the both of them for refill request, and either I or Rip Harbour will return a call back to him when approved by Dr Mare Ferrari.  Pt verbalized understanding, agrees with this plan, and gracious for all the assistance provided.

## 2015-01-15 NOTE — Telephone Encounter (Signed)
Okay to refill temazepam

## 2015-01-16 ENCOUNTER — Other Ambulatory Visit: Payer: Self-pay | Admitting: General Surgery

## 2015-01-16 NOTE — Telephone Encounter (Signed)
OK to refill Restoril

## 2015-01-16 NOTE — Telephone Encounter (Signed)
Request for surgical clearance:  1. What type of surgery is being performed? Lost inguinal hernia repair 2. When is this surgery scheduled? Pending Surgical clearance 3. Are there any medications that need to be held prior to surgery and how long? Plavix for 5 days  4. Name of physician performing surgery? Dr. Dalbert Batman   5. What is your office phone and fax number? Tele [phone] fax [phone]

## 2015-01-16 NOTE — Telephone Encounter (Signed)
Rx called to pharmacy   Will forward clearance to  Dr. Mare Ferrari for review

## 2015-01-17 ENCOUNTER — Other Ambulatory Visit: Payer: Self-pay | Admitting: *Deleted

## 2015-01-17 ENCOUNTER — Telehealth: Payer: Self-pay | Admitting: Cardiology

## 2015-01-17 DIAGNOSIS — G4700 Insomnia, unspecified: Secondary | ICD-10-CM

## 2015-01-17 MED ORDER — TEMAZEPAM 15 MG PO CAPS
ORAL_CAPSULE | ORAL | Status: DC
Start: 2015-01-17 — End: 2015-07-18

## 2015-01-17 NOTE — Telephone Encounter (Signed)
Faxed to Dr Darrel Hoover office

## 2015-01-17 NOTE — Telephone Encounter (Signed)
      Darlin Coco, MD at 01/15/2015 9:33 PM     Status: Signed       Expand All Collapse All   Okay to refill temazepam            Nuala Alpha, LPN at 1/97/5883 2:54 PM     Status: Signed       Expand All Collapse All   Pt calling Kaiana Marion LPN and Dr Mare Ferrari to request refill on his medication Temazepam 15 mg po at bedtime.  Pt states that he will run out of this medication next Tuesday.  Pt states that he was instructed to always call Rip Harbour when his refills of this med get low.  Informed the pt that Rip Harbour and Dr Mare Ferrari are currently out of the office today, but I will route this message to the both of them for refill request, and either I or Rip Harbour will return a call back to him when approved by Dr Mare Ferrari.  Pt verbalized understanding, agrees with this plan, and gracious for all the assistance provided.             Bo Mcclintock at 01/15/2015 2:38 PM     Status: Signed       Expand All Collapse All   New Message   Patient needs Temacepam refilled and needs to speak to a nurse about getting it refilled. Please give patient a call.

## 2015-01-17 NOTE — Telephone Encounter (Signed)
Advised patient clearance sent to Dr Dalbert Batman this am

## 2015-01-17 NOTE — Telephone Encounter (Signed)
New Prob    Pt requesting to speak to nurse regarding upcoming surgery. Please call.

## 2015-01-17 NOTE — Telephone Encounter (Signed)
The patient had a Myoview in January 2016 showing ejection fraction 58% and no reversible ischemia.  He has an old lateral wall scar The patient is cleared from a cardiac standpoint for inguinal hernia surgery. He may stop his Plavix 5 days prior to surgery. He should continue his daily baby aspirin

## 2015-01-23 ENCOUNTER — Ambulatory Visit (HOSPITAL_COMMUNITY)
Admission: RE | Admit: 2015-01-23 | Discharge: 2015-01-23 | Disposition: A | Discharge status: Home / Self Care | Payer: Medicare Other | Source: Ambulatory Visit | Attending: Anesthesiology | Admitting: Anesthesiology

## 2015-01-23 ENCOUNTER — Encounter (HOSPITAL_COMMUNITY): Payer: Self-pay

## 2015-01-23 ENCOUNTER — Encounter (HOSPITAL_COMMUNITY)
Admission: RE | Admit: 2015-01-23 | Discharge: 2015-01-23 | Disposition: A | Discharge status: Home / Self Care | Payer: Medicare Other | Source: Ambulatory Visit | Attending: General Surgery | Admitting: General Surgery

## 2015-01-23 DIAGNOSIS — R0989 Other specified symptoms and signs involving the circulatory and respiratory systems: Secondary | ICD-10-CM | POA: Insufficient documentation

## 2015-01-23 DIAGNOSIS — K4090 Unilateral inguinal hernia, without obstruction or gangrene, not specified as recurrent: Secondary | ICD-10-CM | POA: Diagnosis not present

## 2015-01-23 DIAGNOSIS — R05 Cough: Secondary | ICD-10-CM | POA: Diagnosis not present

## 2015-01-23 DIAGNOSIS — Z01818 Encounter for other preprocedural examination: Secondary | ICD-10-CM | POA: Diagnosis present

## 2015-01-23 HISTORY — DX: Other complications of anesthesia, initial encounter: T88.59XA

## 2015-01-23 HISTORY — DX: Adverse effect of unspecified anesthetic, initial encounter: T41.45XA

## 2015-01-23 HISTORY — DX: Insomnia, unspecified: G47.00

## 2015-01-23 HISTORY — DX: Diverticulosis of intestine, part unspecified, without perforation or abscess without bleeding: K57.90

## 2015-01-23 LAB — PROTIME-INR
INR: 1.16 (ref 0.00–1.49)
Prothrombin Time: 15 seconds (ref 11.6–15.2)

## 2015-01-23 LAB — COMPREHENSIVE METABOLIC PANEL
ALBUMIN: 3.5 g/dL (ref 3.5–5.0)
ALK PHOS: 92 U/L (ref 38–126)
ALT: 16 U/L — ABNORMAL LOW (ref 17–63)
AST: 27 U/L (ref 15–41)
Anion gap: 5 (ref 5–15)
BILIRUBIN TOTAL: 0.2 mg/dL — AB (ref 0.3–1.2)
BUN: 12 mg/dL (ref 6–20)
CO2: 30 mmol/L (ref 22–32)
Calcium: 9.6 mg/dL (ref 8.9–10.3)
Chloride: 103 mmol/L (ref 101–111)
Creatinine, Ser: 0.89 mg/dL (ref 0.61–1.24)
GFR calc Af Amer: >60 mL/min (ref >60)
GFR calc non Af Amer: >60 mL/min (ref >60)
GLUCOSE: 107 mg/dL — AB (ref 65–99)
Potassium: 4.2 mmol/L (ref 3.5–5.1)
Sodium: 138 mmol/L (ref 135–145)
Total Protein: 6.6 g/dL (ref 6.5–8.1)

## 2015-01-23 LAB — URINALYSIS, ROUTINE W REFLEX MICROSCOPIC
Bilirubin Urine: NEGATIVE
Glucose, UA: NEGATIVE mg/dL
HGB URINE DIPSTICK: NEGATIVE
Ketones, ur: NEGATIVE mg/dL
LEUKOCYTES UA: NEGATIVE
Nitrite: NEGATIVE
PROTEIN: NEGATIVE mg/dL
Specific Gravity, Urine: 1.015 (ref 1.005–1.030)
Urobilinogen, UA: 0.2 mg/dL (ref 0.0–1.0)
pH: 6 (ref 5.0–8.0)

## 2015-01-23 LAB — CBC WITH DIFFERENTIAL/PLATELET
BASOS ABS: 0 10*3/uL (ref 0.0–0.1)
Basophils Relative: 1 % (ref 0–1)
EOS ABS: 0.3 10*3/uL (ref 0.0–0.7)
EOS PCT: 5 % (ref 0–5)
HCT: 34.1 % — ABNORMAL LOW (ref 39.0–52.0)
HEMOGLOBIN: 11.5 g/dL — AB (ref 13.0–17.0)
LYMPHS PCT: 26 % (ref 12–46)
Lymphs Abs: 1.5 10*3/uL (ref 0.7–4.0)
MCH: 33.5 pg (ref 26.0–34.0)
MCHC: 33.7 g/dL (ref 30.0–36.0)
MCV: 99.4 fL (ref 78.0–100.0)
MONOS PCT: 8 % (ref 3–12)
Monocytes Absolute: 0.5 10*3/uL (ref 0.1–1.0)
Neutro Abs: 3.5 10*3/uL (ref 1.7–7.7)
Neutrophils Relative %: 60 % (ref 43–77)
PLATELETS: 260 10*3/uL (ref 150–400)
RBC: 3.43 MIL/uL — ABNORMAL LOW (ref 4.22–5.81)
RDW: 12.7 % (ref 11.5–15.5)
WBC: 5.8 10*3/uL (ref 4.0–10.5)

## 2015-01-23 LAB — APTT: APTT: 33 s (ref 24–37)

## 2015-01-23 MED ORDER — CHLORHEXIDINE GLUCONATE 4 % EX LIQD: 1.0000 "application " | Freq: Once | CUTANEOUS | Status: DC

## 2015-01-23 NOTE — Progress Notes (Addendum)
Dr.Brackbill is Cardiologist with last visit in epic from 12-19-14  Multiple echo reports in epic with most recent in 2016  Stress test in epic from 2009/2016  EKG in epic from 09-06-14  Medical Md is [place]  Heart cath done in 2013  Unsure if CXR has been done in the past yr

## 2015-01-23 NOTE — Progress Notes (Addendum)
Anesthesia Consult:  Pt is 81 year old male scheduled for L open inguinal hernia repair on 01/31/2015 with Dr. Dalbert Batman.   Cardiologist is Dr. Mare Ferrari, last office visit 12/19/2014.  PMH includes: CAD (s/p CABG 1986 and 1993; [place] with stent 2008; DES 2010, small non-Q MI 2010; BMS to mid LAD 3300), systolic CHF, LBBB, heart murmur, DVT (many years ago), hepatitis, prostate cancer, VT in the setting of STEMI (2013), COPD, HTN, dyslipidemia, anemia, asthma. Uses 2L O2 at night. Never smoker. BMI 24. GERD. S/p intramedullary nail R hip 08/29/13. S/p ureteroscopy/ureteral stent placement 03/23/13. S/p cystoscopy/ureteral stent placement 02/28/13. S/p lumbar laminectomy 08/10/11 and lumbar wound debridement 08/22/11.   Medications include: ASA, lipitor, plavix, lasix, imdur, metoprolol, nitrostat. Pt to stop plavix 5 days before surgery. ASA is to be continued.   Called to see pt in PAT for reported chest pain 3 weeks ago. Pt was lying in bed for about 30 minutes when chest pain began. Describes as sharp L chest pain. Thought could be heartburn (athough had eaten 4 hours earlier) and took Tums for no relief. Took nitro x2 five minutes apart; after 2nd dose sx completely resolved and pt went to sleep.  Denies any associated sx including dizziness, SOB or diaphoresis. Reports does have occasional SOB with activity such as getting dressed that resolves with rest. Pt reports since hip surgery 07/2013 is very inactive and rarely gets chest pain. Prior to hip surgery would occasionally get chest pain with increased activity.   Preoperative labs reviewed.    Chest x-ray 01/23/2015 reviewed. Prior CABG. Mild cardiomegaly. Areas of scarring bilaterally. No active disease.   EKG 09/06/2014: NSR. LBBB.   Cardiac event monitor 1/13-2/11/6: -NSR. Frequent PACs.   Nuclear stress test 09/06/2014: 1. Large territory infarct involving the lateral wall of the left ventricle. 2. No scintigraphic evidence of pharmacologically  induced ischemia. 3. Mild global hypokinesia. Ejection fraction - 58%.  Echo 09/06/2014: - Left ventricle: The cavity size was normal. Wall thickness was increased in a pattern of mild LVH. There was moderate focal basal hypertrophy of the septum. Systolic function was normal. The estimated ejection fraction was in the range of 55% to 60%. Incoordinate septal motion. Doppler parameters are consistent with abnormal left ventricular relaxation (grade 1 diastolic dysfunction). The E/e&' ratio is between 8-15, suggesting indeterminate LV filling pressure. - Aortic valve: Sclerosis without stenosis. There was no regurgitation. - Mitral valve: Mildly thickened leaflets . There was trivial regurgitation. - Left atrium: Moderately dilated at 41 ml/m2. - Pulmonary arteries: PA peak pressure: 33 mm Hg (S). Impressions:- Compared to the prior echo in 2014, the EF has normalized. Noregional wall motion abnormalities are appreciated.  Pt has cardiac clearance from Dr. Mare Ferrari in [place] telephone encounter dated 01/17/2015.   Willeen Cass, FNP-BC [facility]/Anesthesiology Phone: [phone] 01/23/2015 3:19 PM  Addendum:  Contacted Dr. Mare Ferrari via staff message about pt's chest pain.  He responded: "The patient has known coronary disease and it is not surprising that he might have occasional chest pain from time to time. He has not been experiencing increasing frequent chest pain to suggest unstable angina. He had a Myoview stress test on 09/06/14 which showed a large infarct scar of the lateral wall. His ejection fraction was normal at 58% and there was no inducible reversible ischemia.  Based on this it is reasonable to proceed with surgery as planned."   If no changes, I anticipate pt can proceed with surgery as scheduled.  Willeen Cass, FNP-BC [facility] Short Stay Surgical Center/Anesthesiology Phone: [phone] 01/27/2015 9:55 AM

## 2015-01-23 NOTE — Pre-Procedure Instructions (Signed)
[name]  01/23/2015      [facility] # [place], [address] [place] [address] Phone: [phone] Fax: [phone]    Your procedure is scheduled on Fri, June 3 @ 3:00 PM  Report to [facility] at 1:00 PM  Call this number if you have problems the morning of surgery:  [phone]   Remember:  Do not eat food or drink liquids after midnight.  Take these medicines the morning of surgery with A SIP OF WATER Symbicort(Budesonide),Carbatrol(Carbamazepine),Proscar(Finasteride),Atronvent<Bring Your Inhaler With You>,Isosorbide(Imdur),Metoprolol(Toprol),Zantac(Ranitidine),Sertraline(Zoloft),and Flomax(Tamsulosin)            Stop taking your Aspirin and Plavix as you have been instructed. No Goody's,BC's,Aleve,Ibuprofen,Fish Oil,or any Herbal Medications.   Do not wear jewelry.  Do not wear lotions, powders, or colognes.  You may wear deodorant.  Men may shave face and neck.  Do not bring valuables to the hospital.  [facility] is not responsible for any belongings or valuables.  Contacts, dentures or bridgework may not be worn into surgery.  Leave your suitcase in the car.  After surgery it may be brought to your room.  For patients admitted to the hospital, discharge time will be determined by your treatment team.  Patients discharged the day of surgery will not be allowed to drive home.    Special instructions:  [place] - Preparing for Surgery  Before surgery, you can play an important role.  Because skin is not sterile, your skin needs to be as free of germs as possible.  You can reduce the number of germs on you skin by washing with CHG (chlorahexidine gluconate) soap before surgery.  CHG is an antiseptic cleaner which kills germs and bonds with the skin to continue killing germs even after washing.  Please DO NOT use if you have an allergy to CHG or antibacterial soaps.  If your skin becomes  reddened/irritated stop using the CHG and inform your nurse when you arrive at Short Stay.  Do not shave (including legs and underarms) for at least 48 hours prior to the first CHG shower.  You may shave your face.  Please follow these instructions carefully:   1.  Shower with CHG Soap the night before surgery and the                                morning of Surgery.  2.  If you choose to wash your hair, wash your hair first as usual with your       normal shampoo.  3.  After you shampoo, rinse your hair and body thoroughly to remove the                      Shampoo.  4.  Use CHG as you would any other liquid soap.  You can apply chg directly       to the skin and wash gently with scrungie or a clean washcloth.  5.  Apply the CHG Soap to your body ONLY FROM THE NECK DOWN.        Do not use on open wounds or open sores.  Avoid contact with your eyes,       ears, mouth and genitals (private parts).  Wash genitals (private parts)       with your normal soap.  6.  Wash thoroughly, paying special  attention to the area where your surgery        will be performed.  7.  Thoroughly rinse your body with warm water from the neck down.  8.  DO NOT shower/wash with your normal soap after using and rinsing off       the CHG Soap.  9.  Pat yourself dry with a clean towel.            10.  Wear clean pajamas.            11.  Place clean sheets on your bed the night of your first shower and do not        sleep with pets.  Day of Surgery  Do not apply any lotions/deoderants the morning of surgery.  Please wear clean clothes to the hospital/surgery center.    Please read over the following fact sheets that you were given. Pain Booklet, Coughing and Deep Breathing and Surgical Site Infection Prevention

## 2015-01-29 NOTE — H&P (Signed)
[name]  Location: [facility] Patient #: [id number] DOB: [birth year] Married / Language: English / Race: White Male        History of Present Illness  .  The patient is a 81 year old male who presents with a complaint of recurrent left inguinal hernia. This is a very pleasant 81 year old Caucasian man, referred to me by Dr. Talbert Cage at [facility] for evaluation of a symptomatic recurrent left inguinal hernia. Dr. Darlin Coco is his cardiologist. He states that he has had bilateral inguinal hernia repairs in the past. One of these was done by Dr. Rebekah Chesterfield and the other was done by someone else in the practice. This was 15 years ago or more. He states that mesh was used on both sides. He gives a one-month history of progressive pain and bulge in his left groin. No abdominal pain nausea or vomiting. The hernia is reducible. He is fearful of incarceration. Comorbidities are significant. Ischemic cardiomyopathy, myocardial infarction, CABG 1986, redo CABG 1993, PTCA with stent placement in 2008, drug-eluting stent 2010. Non-Q wave myocardial infarction August 2010. Episode of ventricular tachycardia July 15, 2012 following a fall. Ejection fraction 40-45%. Prostate cancer with radiation therapy 2002. Right hydronephrosis from kidney stones managed by Dr. [name], COPD. GERD. Depression on Zoloft. Right hip fracture August 28, 2013. Despite all of this he is alert and cooperative and friendly. Ambulates with a walker and seems fairly independent. Does not drive a car. He has undergone general anesthesia 4 or 5 times in the last 5 years.  He saw Dr. Mare Ferrari one month ago for routine checkup and I have reviewed all of his notes in summary  He takes Plavix.  He clearly is motivated to have this repaired because of progressive pain and bulge. Because of his symptoms and the fact that this is recurrence, he is at  increased risk for incarceration and strangulation. Despite his comorbidities, I think the safest approach is to repair this electively before he has a complication. Were going to check with Dr. Mare Ferrari to make sure we understand his cardiac risk for general anesthesia and to be sure that we can stop his Plavix 5 days preop. I will do the surgery at [facility] and keep him overnight we're going to do this as an open repair because of his anticoagulation. I discussed the indications, details, techniques, and numerous risk of open repair of recurrent left inguinal hernia with mesh. He is aware of the risk of bleeding, infection, recurrence, damage or loss of the testicle. Damage or to the intestine or bladder, nerve damage with chronic pain, cardiac pulmonary and thromboembolic problems. He understands all of these issues well. He is aware of the risk-benefit analysis. All his questions are answered. He agrees with this plan.    Other Problems  Arthritis Asthma Back Pain Cancer Chest pain Congestive Heart Failure Depression Enlarged Prostate Gastroesophageal Reflux Disease Heart murmur Hemorrhoids Hepatitis High blood pressure Home Oxygen Use Hypercholesterolemia Inguinal Hernia Kidney Stone Myocardial infarction Prostate Cancer Transfusion history  Past Surgical History Appendectomy Cataract Surgery Bilateral. Coronary Artery Bypass Graft Foot Surgery Bilateral. Hip Surgery Right. Knee Surgery Right. Open Inguinal Hernia Surgery Bilateral. Spinal Surgery - Lower Back Tonsillectomy TURP Vasectomy  Diagnostic Studies History  Colonoscopy 5-10 years ago  Allergies Marjean Donna, CMA; 01/16/2015 9:26 AM) No Known Drug Allergies05/19/2016  Medication History Atorvastatin Calcium (80MG  Tablet, Oral) Active. Symbicort (80-4.5MCG/ACT Aerosol, Inhalation) Active. Carbatrol (300MG  Capsule ER 12HR, Oral) Active. Plavix (75MG   Tablet, Oral) Active. Colace (100MG  Capsule, Oral) Active. Proscar (5MG  Tablet, Oral) Active. Imdur (60MG  Tablet ER 24HR, Oral) Active. Zantac (150MG  Tablet, Oral) Active. Senna S (8.6-50MG  Tablet, Oral) Active. Tamsulosin HCl (0.4MG  Capsule ER 24HR, Oral) Active. Temazepam (15MG  Capsule, Oral) Active. Aspirin EC (81MG  Tablet DR, Oral) Active. Lasix (40MG  Tablet, Oral) Active. Atrovent HFA (17MCG/ACT Aerosol Soln, Inhalation) Active. Metoprolol Tartrate (25MG  Tablet, Oral) Active. Nitrostat (0.4MG  Tab Sublingual, Sublingual) Active. Potassium Bicarbonate (20MEQ Tablet Effer, Oral) Active. Sertraline HCl (100MG  Tablet, Oral) Active. Medications Reconciled  Social History  Caffeine use Coffee, Tea. No alcohol use No drug use Tobacco use Never smoker.  Family History  Anesthetic complications Brother, Mother, Sister. Cerebrovascular Accident Sister. Depression Daughter. Diabetes Mellitus Brother, Sister. Heart Disease Brother, Daughter, Mother, Sister. Heart disease in male family member before age 91 Heart disease in male family member before age 11 Hypertension Brother, Sister. Prostate Cancer Brother. Respiratory Condition Daughter.  Review of Systems General Not Present- Appetite Loss, Chills, Fatigue, Fever, Night Sweats, Weight Gain and Weight Loss. Skin Present- Dryness. Not Present- Change in Wart/Mole, Hives, Jaundice, New Lesions, Non-Healing Wounds, Rash and Ulcer. HEENT Present- Hearing Loss, Seasonal Allergies and Wears glasses/contact lenses. Not Present- Earache, Hoarseness, Nose Bleed, Oral Ulcers, Ringing in the Ears, Sinus Pain, Sore Throat, Visual Disturbances and Yellow Eyes. Respiratory Present- Chronic Cough. Not Present- Bloody sputum, Difficulty Breathing, Snoring and Wheezing. Cardiovascular Not Present- Chest Pain, Difficulty Breathing Lying Down, Leg Cramps, Palpitations, Rapid Heart Rate, Shortness of Breath and Swelling  of Extremities. Gastrointestinal Present- Abdominal Pain, Change in Bowel Habits, Constipation, Excessive gas, Hemorrhoids and Indigestion. Not Present- Bloating, Bloody Stool, Chronic diarrhea, Difficulty Swallowing, Gets full quickly at meals, Nausea, Rectal Pain and Vomiting. Male Genitourinary Present- Impotence. Not Present- Blood in Urine, Change in Urinary Stream, Frequency, Nocturia, Painful Urination, Urgency and Urine Leakage. Musculoskeletal Present- Back Pain, Joint Pain, Joint Stiffness, Muscle Pain, Muscle Weakness and Swelling of Extremities. Neurological Present- Numbness, Tingling, Trouble walking and Weakness. Not Present- Decreased Memory, Fainting, Headaches, Seizures and Tremor. Psychiatric Present- Depression. Not Present- Anxiety, Bipolar, Change in Sleep Pattern, Fearful and Frequent crying. Hematology Present- Easy Bruising. Not Present- Excessive bleeding, Gland problems, HIV and Persistent Infections.   Vitals   Weight: 164 lb Height: 67in Body Surface Area: 1.88 m Body Mass Index: 25.69 kg/m Temp.: 97.45F(Temporal)  Pulse: 68 (Regular)  BP: 122/78 (Sitting, Left Arm, Standard)    Physical Exam  General Mental Status-Alert. General Appearance-Consistent with stated age. Hydration-Well hydrated. Voice-Normal. Note: Ambulating independently with a walker. Was able to get up on the exam table with minimal help. No dizziness. Very pleasant. No distress   Head and Neck Head-normocephalic, atraumatic with no lesions or palpable masses. Trachea-midline. Thyroid Gland Characteristics - normal size and consistency.  Eye Eyeball - Bilateral-Extraocular movements intact. Sclera/Conjunctiva - Bilateral-No scleral icterus.  Chest and Lung Exam Chest and lung exam reveals -quiet, even and easy respiratory effort with no use of accessory muscles and on auscultation, normal breath sounds, no adventitious sounds and normal vocal  resonance. Inspection Chest Wall - Normal. Back - normal.  Cardiovascular Note: Heart reveals regular rate and rhythm. Grade 3 systolic murmur. Well-healed sternotomy scar. Excellent femoral pulses.   Abdomen Inspection Inspection of the abdomen reveals - No Hernias. Skin - Scar - no surgical scars. Palpation/Percussion Palpation and Percussion of the abdomen reveal - Soft, Non Tender, No Rebound tenderness, No Rigidity (guarding) and No hepatosplenomegaly. Auscultation Auscultation of the abdomen reveals - Bowel sounds normal.  Male Genitourinary Note: Lemon sized right inguinal hernia, obvious, reducible. No hernia on the right. Umbilicus shows maybe a pinpoint hernia but not obvious. No scrotal mass. Cannot really see the hernia surgery scars because of pubic hair.   Neurologic Neurologic evaluation reveals -alert and oriented x 3 with no impairment of recent or remote memory. Mental Status-Normal.  Musculoskeletal Normal Exam - Left-Upper Extremity Strength Normal and Lower Extremity Strength Normal. Normal Exam - Right-Upper Extremity Strength Normal and Lower Extremity Strength Normal.  Lymphatic Head & Neck  General Head & Neck Lymphatics: Bilateral - Description - Normal. Axillary  General Axillary Region: Bilateral - Description - Normal. Tenderness - Non Tender. Femoral & Inguinal  Generalized Femoral & Inguinal Lymphatics: Bilateral - Description - Normal. Tenderness - Non Tender.    Assessment & Plan  RECURRENT LEFT INGUINAL HERNIA (550.91  K40.91)   Schedule for Surgery You have a recurrent left inguinal hernia. Fortunately, it is reducible, but because you are having progressive pain and a bulge you are at increased risk for incarceration and strangulation. We have decided to proceed with scheduling of open repair of your left inguinal hernia with mesh will need to stop her Plavix 5 days preop We will ask Dr. Mare Ferrari for a cardiac risk  assessment and permission to stop the Plavix We have discussed the indications, techniques, and numerous risk of this surgery in detail. Please read the written information that I gave you. Pt Education - Groin (Inguinal) Hernia Repair: inguinal hernia repair  HISTORY OF MI (MYOCARDIAL INFARCTION) (412  I25.2)  HISTORY OF CORONARY ARTERY BYPASS GRAFT (Admitting Diagnosis) (V45.81  Z95.1) Impression: CABG 1986. Redo CABG 1993. On Plavix.  HISTORY OF HEART ARTERY STENT (V45.82  Z95.5) Impression: PTCA with stent October 2008. Drug-eluting stent February 2010.  VENTRICULAR TACHYCARDIA (PAROXYSMAL) (427.1  I47.2) Impression: Episode of ventricular tachycardia July 15, 2012 after a fall. Cardioverted.  ISCHEMIC CARDIOMYOPATHY (414.8  I25.5) Impression: Followed by Dr. Mare Ferrari. EF 40-45%.  HISTORY OF PROSTATE CANCER (V10.46  Z85.46) Impression: Radiation therapy 2002. On Flomax.  HISTORY OF NEPHROLITHIASIS (V13.01  Z87.442) Impression: History right ureteral calculus with hydronephrosis. Required surgical intervention by Dr. [name] . HISTORY OF FRACTURE OF RIGHT HIP [phone]) Impression: ORIF 08/28/13 Dr. [name].  COPD, MODERATE (11  J44.9)  DEPRESSION, CONTROLLED (311  F32.9) Impression: Takes Zoloft.  HISTORY OF INGUINAL HERNIA REPAIR, BILATERAL (V45.89  Z98.89) Impression: States mesh was used. 15 years ago. Details unknown.    Edsel Petrin. Dalbert Batman, M.D., [facility] General and Minimally invasive Surgery Breast and Colorectal Surgery Office:   [phone] Pager:   [phone]

## 2015-01-30 MED ORDER — CEFAZOLIN SODIUM-DEXTROSE 2-3 GM-% IV SOLR
2.0000 g | INTRAVENOUS | Status: DC
  Filled 2015-01-30: qty 50

## 2015-01-30 NOTE — Progress Notes (Signed)
Spoke with pts wife and notified of time change.New arrival time of 0530.Verbalized understanding.

## 2015-01-31 ENCOUNTER — Ambulatory Visit (HOSPITAL_COMMUNITY): Payer: Medicare Other | Admitting: Anesthesiology

## 2015-01-31 ENCOUNTER — Ambulatory Visit (HOSPITAL_COMMUNITY): Payer: Medicare Other | Admitting: Emergency Medicine

## 2015-01-31 ENCOUNTER — Encounter (HOSPITAL_COMMUNITY): Payer: Self-pay | Admitting: *Deleted

## 2015-01-31 ENCOUNTER — Ambulatory Visit (HOSPITAL_COMMUNITY)
Admission: RE | Admit: 2015-01-31 | Discharge: 2015-02-01 | Disposition: A | Discharge status: Home / Self Care | Payer: Medicare Other | Source: Ambulatory Visit | Attending: General Surgery | Admitting: General Surgery

## 2015-01-31 ENCOUNTER — Encounter (HOSPITAL_COMMUNITY)
Admission: RE | Disposition: A | Discharge status: Home / Self Care | Payer: Self-pay | Source: Ambulatory Visit | Attending: General Surgery

## 2015-01-31 DIAGNOSIS — I252 Old myocardial infarction: Secondary | ICD-10-CM | POA: Diagnosis not present

## 2015-01-31 DIAGNOSIS — K4090 Unilateral inguinal hernia, without obstruction or gangrene, not specified as recurrent: Secondary | ICD-10-CM

## 2015-01-31 DIAGNOSIS — Z951 Presence of aortocoronary bypass graft: Secondary | ICD-10-CM | POA: Diagnosis not present

## 2015-01-31 DIAGNOSIS — I2510 Atherosclerotic heart disease of native coronary artery without angina pectoris: Secondary | ICD-10-CM | POA: Insufficient documentation

## 2015-01-31 DIAGNOSIS — I255 Ischemic cardiomyopathy: Secondary | ICD-10-CM | POA: Diagnosis not present

## 2015-01-31 DIAGNOSIS — Z87442 Personal history of urinary calculi: Secondary | ICD-10-CM | POA: Diagnosis not present

## 2015-01-31 DIAGNOSIS — I10 Essential (primary) hypertension: Secondary | ICD-10-CM | POA: Insufficient documentation

## 2015-01-31 DIAGNOSIS — Z923 Personal history of irradiation: Secondary | ICD-10-CM | POA: Insufficient documentation

## 2015-01-31 DIAGNOSIS — Z9889 Other specified postprocedural states: Secondary | ICD-10-CM

## 2015-01-31 DIAGNOSIS — J45909 Unspecified asthma, uncomplicated: Secondary | ICD-10-CM | POA: Diagnosis not present

## 2015-01-31 DIAGNOSIS — J449 Chronic obstructive pulmonary disease, unspecified: Secondary | ICD-10-CM | POA: Diagnosis not present

## 2015-01-31 DIAGNOSIS — Z8546 Personal history of malignant neoplasm of prostate: Secondary | ICD-10-CM | POA: Diagnosis not present

## 2015-01-31 DIAGNOSIS — E780 Pure hypercholesterolemia: Secondary | ICD-10-CM | POA: Diagnosis not present

## 2015-01-31 DIAGNOSIS — Z955 Presence of coronary angioplasty implant and graft: Secondary | ICD-10-CM | POA: Diagnosis not present

## 2015-01-31 DIAGNOSIS — K219 Gastro-esophageal reflux disease without esophagitis: Secondary | ICD-10-CM | POA: Diagnosis not present

## 2015-01-31 DIAGNOSIS — K4091 Unilateral inguinal hernia, without obstruction or gangrene, recurrent: Secondary | ICD-10-CM | POA: Insufficient documentation

## 2015-01-31 DIAGNOSIS — M1990 Unspecified osteoarthritis, unspecified site: Secondary | ICD-10-CM | POA: Diagnosis not present

## 2015-01-31 DIAGNOSIS — F329 Major depressive disorder, single episode, unspecified: Secondary | ICD-10-CM | POA: Insufficient documentation

## 2015-01-31 DIAGNOSIS — I509 Heart failure, unspecified: Secondary | ICD-10-CM | POA: Diagnosis not present

## 2015-01-31 DIAGNOSIS — Z7982 Long term (current) use of aspirin: Secondary | ICD-10-CM | POA: Insufficient documentation

## 2015-01-31 DIAGNOSIS — Z8719 Personal history of other diseases of the digestive system: Secondary | ICD-10-CM

## 2015-01-31 HISTORY — PX: INSERTION OF MESH: SHX5868

## 2015-01-31 HISTORY — DX: Hyperlipidemia, unspecified: E78.5

## 2015-01-31 HISTORY — DX: Malignant neoplasm of prostate: C61

## 2015-01-31 HISTORY — PX: INGUINAL HERNIA REPAIR: SHX194

## 2015-01-31 HISTORY — DX: Dependence on supplemental oxygen: Z99.81

## 2015-01-31 LAB — CBC
HCT: 33.3 % — ABNORMAL LOW (ref 39.0–52.0)
Hemoglobin: 11.1 g/dL — ABNORMAL LOW (ref 13.0–17.0)
MCH: 32.8 pg (ref 26.0–34.0)
MCHC: 33.3 g/dL (ref 30.0–36.0)
MCV: 98.5 fL (ref 78.0–100.0)
Platelets: 231 10*3/uL (ref 150–400)
RBC: 3.38 MIL/uL — AB (ref 4.22–5.81)
RDW: 12.5 % (ref 11.5–15.5)
WBC: 7.9 10*3/uL (ref 4.0–10.5)

## 2015-01-31 LAB — CREATININE, SERUM
Creatinine, Ser: 0.89 mg/dL (ref 0.61–1.24)
GFR calc Af Amer: >60 mL/min (ref >60)

## 2015-01-31 SURGERY — REPAIR, HERNIA, INGUINAL, ADULT
Anesthesia: Regional | Site: Groin | Laterality: Left

## 2015-01-31 MED ORDER — IPRATROPIUM BROMIDE 0.02 % IN SOLN: 0.5000 mg | RESPIRATORY_TRACT | Status: DC | PRN

## 2015-01-31 MED ORDER — IPRATROPIUM BROMIDE 0.02 % IN SOLN
0.5000 mg | Freq: Four times a day (QID) | RESPIRATORY_TRACT | Status: DC
  Administered 2015-01-31: 0.5 mg via RESPIRATORY_TRACT
  Filled 2015-01-31: qty 2.5

## 2015-01-31 MED ORDER — 0.9 % SODIUM CHLORIDE (POUR BTL) OPTIME
TOPICAL | Status: DC | PRN
  Administered 2015-01-31: 1000 mL

## 2015-01-31 MED ORDER — OXYCODONE HCL 5 MG PO TABS: 5.0000 mg | ORAL_TABLET | Freq: Once | ORAL | Status: DC | PRN

## 2015-01-31 MED ORDER — LACTATED RINGERS IV SOLN
INTRAVENOUS | Status: DC | PRN
  Administered 2015-01-31: 07:00:00 via INTRAVENOUS

## 2015-01-31 MED ORDER — EPHEDRINE SULFATE 50 MG/ML IJ SOLN
INTRAMUSCULAR | Status: AC
  Filled 2015-01-31: qty 1

## 2015-01-31 MED ORDER — ROCURONIUM BROMIDE 100 MG/10ML IV SOLN
INTRAVENOUS | Status: DC | PRN
  Administered 2015-01-31: 10 mg via INTRAVENOUS
  Administered 2015-01-31: 40 mg via INTRAVENOUS

## 2015-01-31 MED ORDER — TAMSULOSIN HCL 0.4 MG PO CAPS
0.4000 mg | ORAL_CAPSULE | Freq: Every day | ORAL | Status: DC
  Administered 2015-02-01: 0.4 mg via ORAL
  Filled 2015-01-31: qty 1

## 2015-01-31 MED ORDER — HYDROMORPHONE HCL 1 MG/ML IJ SOLN
INTRAMUSCULAR | Status: AC
  Filled 2015-01-31: qty 1

## 2015-01-31 MED ORDER — MEPERIDINE HCL 25 MG/ML IJ SOLN
6.2500 mg | INTRAMUSCULAR | Status: DC | PRN
Start: 2015-01-31 — End: 2015-01-31

## 2015-01-31 MED ORDER — FINASTERIDE 5 MG PO TABS
5.0000 mg | ORAL_TABLET | Freq: Every day | ORAL | Status: DC
  Administered 2015-02-01: 5 mg via ORAL
  Filled 2015-01-31: qty 1

## 2015-01-31 MED ORDER — PHENYLEPHRINE HCL 10 MG/ML IJ SOLN
INTRAMUSCULAR | Status: DC | PRN
  Administered 2015-01-31: 80 ug via INTRAVENOUS
  Administered 2015-01-31: 40 ug via INTRAVENOUS
  Administered 2015-01-31: 80 ug via INTRAVENOUS
  Administered 2015-01-31 (×3): 40 ug via INTRAVENOUS
  Administered 2015-01-31: 80 ug via INTRAVENOUS

## 2015-01-31 MED ORDER — GLYCOPYRROLATE 0.2 MG/ML IJ SOLN
INTRAMUSCULAR | Status: DC | PRN
  Administered 2015-01-31: 0.6 mg via INTRAVENOUS

## 2015-01-31 MED ORDER — HYDROCODONE-ACETAMINOPHEN 5-325 MG PO TABS
1.0000 | ORAL_TABLET | ORAL | Status: DC | PRN
  Administered 2015-01-31: 2 via ORAL
  Administered 2015-01-31 (×2): 1 via ORAL
  Administered 2015-02-01: 2 via ORAL
  Filled 2015-01-31: qty 1
  Filled 2015-01-31 (×2): qty 2
  Filled 2015-01-31: qty 1

## 2015-01-31 MED ORDER — DOCUSATE SODIUM 100 MG PO CAPS
100.0000 mg | ORAL_CAPSULE | Freq: Every day | ORAL | Status: DC
  Administered 2015-02-01: 100 mg via ORAL
  Filled 2015-01-31: qty 1

## 2015-01-31 MED ORDER — ATORVASTATIN CALCIUM 80 MG PO TABS
80.0000 mg | ORAL_TABLET | Freq: Every evening | ORAL | Status: DC
  Administered 2015-01-31: 80 mg via ORAL
  Filled 2015-01-31: qty 1

## 2015-01-31 MED ORDER — STERILE WATER FOR INJECTION IJ SOLN
INTRAMUSCULAR | Status: AC
  Filled 2015-01-31: qty 10

## 2015-01-31 MED ORDER — NEOSTIGMINE METHYLSULFATE 10 MG/10ML IV SOLN
INTRAVENOUS | Status: AC
  Filled 2015-01-31: qty 1

## 2015-01-31 MED ORDER — POTASSIUM CHLORIDE CRYS ER 20 MEQ PO TBCR
20.0000 meq | EXTENDED_RELEASE_TABLET | Freq: Every day | ORAL | Status: DC
  Administered 2015-02-01: 20 meq via ORAL
  Filled 2015-01-31: qty 1

## 2015-01-31 MED ORDER — FAMOTIDINE 20 MG PO TABS
20.0000 mg | ORAL_TABLET | Freq: Every day | ORAL | Status: DC
  Administered 2015-02-01: 20 mg via ORAL
  Filled 2015-01-31: qty 1

## 2015-01-31 MED ORDER — EPHEDRINE SULFATE 50 MG/ML IJ SOLN
INTRAMUSCULAR | Status: DC | PRN
  Administered 2015-01-31 (×2): 10 mg via INTRAVENOUS
  Administered 2015-01-31: 5 mg via INTRAVENOUS

## 2015-01-31 MED ORDER — ROCURONIUM BROMIDE 50 MG/5ML IV SOLN
INTRAVENOUS | Status: AC
  Filled 2015-01-31: qty 1

## 2015-01-31 MED ORDER — PHENYLEPHRINE 40 MCG/ML (10ML) SYRINGE FOR IV PUSH (FOR BLOOD PRESSURE SUPPORT)
PREFILLED_SYRINGE | INTRAVENOUS | Status: AC
  Filled 2015-01-31: qty 10

## 2015-01-31 MED ORDER — SENNOSIDES 8.6 MG PO TABS
1.0000 | ORAL_TABLET | ORAL | Status: DC | PRN
  Filled 2015-01-31: qty 1

## 2015-01-31 MED ORDER — BUDESONIDE-FORMOTEROL FUMARATE 80-4.5 MCG/ACT IN AERO
2.0000 | INHALATION_SPRAY | Freq: Two times a day (BID) | RESPIRATORY_TRACT | Status: DC
  Administered 2015-01-31 – 2015-02-01 (×2): 2 via RESPIRATORY_TRACT
  Filled 2015-01-31: qty 6.9

## 2015-01-31 MED ORDER — CEFAZOLIN SODIUM-DEXTROSE 2-3 GM-% IV SOLR
2.0000 g | Freq: Three times a day (TID) | INTRAVENOUS | Status: AC
  Administered 2015-01-31 – 2015-02-01 (×3): 2 g via INTRAVENOUS
  Filled 2015-01-31 (×4): qty 50

## 2015-01-31 MED ORDER — GLYCOPYRROLATE 0.2 MG/ML IJ SOLN
INTRAMUSCULAR | Status: AC
  Filled 2015-01-31: qty 3

## 2015-01-31 MED ORDER — PROPOFOL 10 MG/ML IV BOLUS
INTRAVENOUS | Status: AC
  Filled 2015-01-31: qty 20

## 2015-01-31 MED ORDER — ISOSORBIDE MONONITRATE ER 30 MG PO TB24
30.0000 mg | ORAL_TABLET | Freq: Every day | ORAL | Status: DC
  Administered 2015-02-01: 60 mg via ORAL
  Filled 2015-01-31: qty 2

## 2015-01-31 MED ORDER — PROPOFOL 10 MG/ML IV BOLUS
INTRAVENOUS | Status: DC | PRN
  Administered 2015-01-31: 80 mg via INTRAVENOUS
  Administered 2015-01-31: 60 mg via INTRAVENOUS

## 2015-01-31 MED ORDER — OXYCODONE HCL 5 MG/5ML PO SOLN: 5.0000 mg | Freq: Once | ORAL | Status: DC | PRN

## 2015-01-31 MED ORDER — HYDROMORPHONE HCL 1 MG/ML IJ SOLN
0.2500 mg | INTRAMUSCULAR | Status: DC | PRN
  Administered 2015-01-31: 0.25 mg via INTRAVENOUS

## 2015-01-31 MED ORDER — LIDOCAINE HCL (CARDIAC) 20 MG/ML IV SOLN
INTRAVENOUS | Status: DC | PRN
  Administered 2015-01-31: 80 mg via INTRAVENOUS

## 2015-01-31 MED ORDER — ONDANSETRON HCL 4 MG/2ML IJ SOLN
INTRAMUSCULAR | Status: DC | PRN
  Administered 2015-01-31 (×2): 4 mg via INTRAVENOUS

## 2015-01-31 MED ORDER — CEFAZOLIN SODIUM-DEXTROSE 2-3 GM-% IV SOLR
INTRAVENOUS | Status: DC | PRN
  Administered 2015-01-31: 2 g via INTRAVENOUS

## 2015-01-31 MED ORDER — SUCCINYLCHOLINE CHLORIDE 20 MG/ML IJ SOLN
INTRAMUSCULAR | Status: AC
  Filled 2015-01-31: qty 1

## 2015-01-31 MED ORDER — TEMAZEPAM 15 MG PO CAPS
15.0000 mg | ORAL_CAPSULE | Freq: Every evening | ORAL | Status: DC | PRN
  Administered 2015-01-31: 15 mg via ORAL
  Filled 2015-01-31: qty 2

## 2015-01-31 MED ORDER — ONDANSETRON HCL 4 MG PO TABS: 4.0000 mg | ORAL_TABLET | Freq: Four times a day (QID) | ORAL | Status: DC | PRN

## 2015-01-31 MED ORDER — FENTANYL CITRATE (PF) 250 MCG/5ML IJ SOLN
INTRAMUSCULAR | Status: AC
  Filled 2015-01-31: qty 5

## 2015-01-31 MED ORDER — ONDANSETRON HCL 4 MG/2ML IJ SOLN
INTRAMUSCULAR | Status: AC
  Filled 2015-01-31: qty 2

## 2015-01-31 MED ORDER — FENTANYL CITRATE (PF) 100 MCG/2ML IJ SOLN
12.5000 ug | INTRAMUSCULAR | Status: DC | PRN
  Administered 2015-01-31: 25 ug via INTRAVENOUS

## 2015-01-31 MED ORDER — POTASSIUM CHLORIDE IN NACL 20-0.9 MEQ/L-% IV SOLN
INTRAVENOUS | Status: DC
  Administered 2015-01-31: 14:00:00 via INTRAVENOUS
  Filled 2015-01-31 (×3): qty 1000

## 2015-01-31 MED ORDER — ENOXAPARIN SODIUM 30 MG/0.3ML ~~LOC~~ SOLN
30.0000 mg | SUBCUTANEOUS | Status: DC
  Administered 2015-02-01: 30 mg via SUBCUTANEOUS
  Filled 2015-01-31: qty 0.3

## 2015-01-31 MED ORDER — NEOSTIGMINE METHYLSULFATE 10 MG/10ML IV SOLN
INTRAVENOUS | Status: DC | PRN
  Administered 2015-01-31: 4 mg via INTRAVENOUS

## 2015-01-31 MED ORDER — BUPIVACAINE-EPINEPHRINE (PF) 0.5% -1:200000 IJ SOLN
INTRAMUSCULAR | Status: DC | PRN
  Administered 2015-01-31: 23 mL via PERINEURAL

## 2015-01-31 MED ORDER — BUPIVACAINE-EPINEPHRINE 0.5% -1:200000 IJ SOLN
INTRAMUSCULAR | Status: DC | PRN
  Administered 2015-01-31: 30 mL

## 2015-01-31 MED ORDER — FENTANYL CITRATE (PF) 100 MCG/2ML IJ SOLN
INTRAMUSCULAR | Status: AC
  Filled 2015-01-31: qty 2

## 2015-01-31 MED ORDER — ONDANSETRON HCL 4 MG/2ML IJ SOLN: 4.0000 mg | Freq: Four times a day (QID) | INTRAMUSCULAR | Status: DC | PRN

## 2015-01-31 MED ORDER — FUROSEMIDE 40 MG PO TABS
40.0000 mg | ORAL_TABLET | Freq: Every morning | ORAL | Status: DC
  Administered 2015-02-01: 40 mg via ORAL
  Filled 2015-01-31: qty 1

## 2015-01-31 MED ORDER — NITROGLYCERIN 0.4 MG SL SUBL: 0.4000 mg | SUBLINGUAL_TABLET | SUBLINGUAL | Status: DC | PRN

## 2015-01-31 MED ORDER — FENTANYL CITRATE (PF) 100 MCG/2ML IJ SOLN
INTRAMUSCULAR | Status: DC | PRN
  Administered 2015-01-31 (×3): 50 ug via INTRAVENOUS

## 2015-01-31 SURGICAL SUPPLY — 53 items
ADH SKN CLS APL DERMABOND .7 (GAUZE/BANDAGES/DRESSINGS) ×1
BLADE SURG 10 STRL SS (BLADE) ×3 IMPLANT
BLADE SURG 15 STRL LF DISP TIS (BLADE) ×1 IMPLANT
BLADE SURG 15 STRL SS (BLADE) ×3
BLADE SURG ROTATE 9660 (MISCELLANEOUS) ×2 IMPLANT
CANISTER SUCTION 2500CC (MISCELLANEOUS) ×3 IMPLANT
CHLORAPREP W/TINT 26ML (MISCELLANEOUS) ×3 IMPLANT
COVER SURGICAL LIGHT HANDLE (MISCELLANEOUS) ×3 IMPLANT
DERMABOND ADVANCED (GAUZE/BANDAGES/DRESSINGS) ×2
DERMABOND ADVANCED .7 DNX12 (GAUZE/BANDAGES/DRESSINGS) ×1 IMPLANT
DRAIN PENROSE 1/2X12 LTX STRL (WOUND CARE) ×2 IMPLANT
DRAPE LAPAROTOMY TRNSV 102X78 (DRAPE) ×3 IMPLANT
DRAPE UTILITY XL STRL (DRAPES) ×3 IMPLANT
ELECT CAUTERY BLADE 6.4 (BLADE) ×3 IMPLANT
ELECT REM PT RETURN 9FT ADLT (ELECTROSURGICAL) ×3
ELECTRODE REM PT RTRN 9FT ADLT (ELECTROSURGICAL) ×1 IMPLANT
GLOVE BIO SURGEON STRL SZ 6.5 (GLOVE) ×1 IMPLANT
GLOVE BIO SURGEON STRL SZ7.5 (GLOVE) ×2 IMPLANT
GLOVE BIO SURGEONS STRL SZ 6.5 (GLOVE) ×1
GLOVE BIOGEL PI IND STRL 7.0 (GLOVE) IMPLANT
GLOVE BIOGEL PI INDICATOR 7.0 (GLOVE) ×4
GLOVE EUDERMIC 7 POWDERFREE (GLOVE) ×3 IMPLANT
GLOVE SURG SS PI 7.0 STRL IVOR (GLOVE) ×2 IMPLANT
GOWN STRL REUS W/ TWL LRG LVL3 (GOWN DISPOSABLE) ×1 IMPLANT
GOWN STRL REUS W/ TWL XL LVL3 (GOWN DISPOSABLE) ×1 IMPLANT
GOWN STRL REUS W/TWL LRG LVL3 (GOWN DISPOSABLE) ×3
GOWN STRL REUS W/TWL XL LVL3 (GOWN DISPOSABLE) ×3
KIT BASIN OR (CUSTOM PROCEDURE TRAY) ×3 IMPLANT
KIT ROOM TURNOVER OR (KITS) ×3 IMPLANT
MESH ULTRAPRO 3X6 7.6X15CM (Mesh General) ×2 IMPLANT
NDL HYPO 25GX1X1/2 BEV (NEEDLE) ×1 IMPLANT
NEEDLE HYPO 25GX1X1/2 BEV (NEEDLE) ×3 IMPLANT
NS IRRIG 1000ML POUR BTL (IV SOLUTION) ×3 IMPLANT
PACK SURGICAL SETUP 50X90 (CUSTOM PROCEDURE TRAY) ×3 IMPLANT
PAD ARMBOARD 7.5X6 YLW CONV (MISCELLANEOUS) ×3 IMPLANT
PENCIL BUTTON HOLSTER BLD 10FT (ELECTRODE) ×3 IMPLANT
SPONGE LAP 18X18 X RAY DECT (DISPOSABLE) ×3 IMPLANT
SUT MNCRL AB 4-0 PS2 18 (SUTURE) ×3 IMPLANT
SUT PROLENE 2 0 CT2 30 (SUTURE) ×9 IMPLANT
SUT SILK 2 0 (SUTURE) ×3
SUT SILK 2 0 SH (SUTURE) IMPLANT
SUT SILK 2-0 18XBRD TIE 12 (SUTURE) ×1 IMPLANT
SUT VIC AB 2-0 CT1 27 (SUTURE) ×6
SUT VIC AB 2-0 CT1 TAPERPNT 27 (SUTURE) ×1 IMPLANT
SUT VIC AB 3-0 SH 27 (SUTURE) ×3
SUT VIC AB 3-0 SH 27XBRD (SUTURE) ×1 IMPLANT
SYR BULB 3OZ (MISCELLANEOUS) ×3 IMPLANT
SYR CONTROL 10ML LL (SYRINGE) ×3 IMPLANT
TOWEL OR 17X24 6PK STRL BLUE (TOWEL DISPOSABLE) ×3 IMPLANT
TOWEL OR 17X26 10 PK STRL BLUE (TOWEL DISPOSABLE) ×3 IMPLANT
TUBE CONNECTING 12'X1/4 (SUCTIONS) ×1
TUBE CONNECTING 12X1/4 (SUCTIONS) ×2 IMPLANT
YANKAUER SUCT BULB TIP NO VENT (SUCTIONS) ×3 IMPLANT

## 2015-01-31 NOTE — Interval H&P Note (Signed)
History and Physical Interval Note:  01/31/2015 6:30 AM  [name]  has presented today for surgery, with the diagnosis of left inguinal hernia  The various methods of treatment have been discussed with the patient and family. After consideration of risks, benefits and other options for treatment, the patient has consented to  Procedure(s): OPEN REPAIR RECURRENT LEFT INGUINAL HERNIA (Left) INSERTION OF MESH (Left) as a surgical intervention .  The patient's history has been reviewed, patient examined, no change in status, stable for surgery.  I have reviewed the patient's chart and labs.  Questions were answered to the patient's satisfaction.     Adin Hector

## 2015-01-31 NOTE — Anesthesia Procedure Notes (Addendum)
Procedure Name: Intubation Date/Time: 01/31/2015 7:39 AM Performed by: Gershon Mussel, VEDWATTIE Pre-anesthesia Checklist: Patient identified, Patient being monitored, Timeout performed, Emergency Drugs available and Suction available Patient Re-evaluated:Patient Re-evaluated prior to inductionOxygen Delivery Method: Circle System Utilized Preoxygenation: Pre-oxygenation with 100% oxygen Intubation Type: IV induction Ventilation: Mask ventilation without difficulty Laryngoscope Size: Miller and 2 Grade View: Grade I Tube type: Oral Tube size: 7.0 mm Number of attempts: 1 Airway Equipment and Method: Stylet Placement Confirmation: ETT inserted through vocal cords under direct vision,  positive ETCO2 and breath sounds checked- equal and bilateral Secured at: 21 cm Tube secured with: Tape Dental Injury: Teeth and Oropharynx as per pre-operative assessment    Anesthesia Regional Block:  TAP block  Pre-Anesthetic Checklist: ,, timeout performed, Correct Patient, Correct Site, Correct Laterality, Correct Procedure, Correct Position, site marked, Risks and benefits discussed,  Surgical consent,  Pre-op evaluation,  At surgeon's request and post-op pain management  Laterality: Left and Lower  Prep: chloraprep       Needles:  Injection technique: Single-shot  Needle Type: Echogenic Needle     Needle Length: 9cm 9 cm Needle Gauge: 21 and 21 G    Additional Needles:  Procedures: ultrasound guided (picture in chart) TAP block Narrative:  Start time: 01/31/2015 7:20 AM End time: 01/31/2015 7:25 AM Injection made incrementally with aspirations every 5 mL.  Performed by: Personally  Anesthesiologist: Jadakiss Barish

## 2015-01-31 NOTE — Op Note (Signed)
Patient Name:           [name]   Date of Surgery:        01/31/2015  Pre op Diagnosis:      Recurrent left inguinal hernia  Post op Diagnosis:    Recurrent left inguinal hernia  Procedure:                 Open repair recurrent left inguinal hernia with mesh  Surgeon:                     Edsel Petrin. Dalbert Batman, M.D., FACS  Assistant:                      Or staff  Operative Indications:   The patient is a 81 year old male who presents with a complaint of recurrent left inguinal hernia. This is a very pleasant 81 year old Caucasian man, referred to me by Dr. Talbert Perez at [facility] for evaluation of a symptomatic recurrent left inguinal hernia. Dr. Darlin Perez is his cardiologist. He states that he has had bilateral inguinal hernia repairs in the past. One of these was done by Dr. Rebekah Perez and the other was done by someone else in the practice. This was 15 years ago or more. He states that mesh was used on both sides. He gives a one-month history of progressive pain and bulge in his left groin. No abdominal pain nausea or vomiting. The hernia is reducible. He is fearful of incarceration.  Examination reveals a lemon-sized hernia when standing, reducible on the left.  No hernia on the right. Comorbidities are significant. Ischemic cardiomyopathy, myocardial infarction, CABG 1986, redo CABG 1993, PTCA with stent placement in 2008, drug-eluting stent 2010. Non-Q wave myocardial infarction August 2010. Episode of ventricular tachycardia July 15, 2012 following a fall. Ejection fraction 40-45%. Prostate cancer with radiation therapy 2002. Right hydronephrosis from kidney stones managed by Dr. Junious Perez, COPD. GERD. Depression on Zoloft. Right hip fracture August 28, 2013. Despite all of this he is alert and cooperative and friendly. Ambulates with a walker and seems fairly independent. Does not drive a car. He has undergone general anesthesia 4  or 5 times in the last 5 years. He saw Dr. Mare Perez one month ago for routine checkup and I have reviewed all of his notes in summary.  Dr. Mare Perez has cleared him for surgery from a cardiac standpoint stating that his risk is acceptable. He takes Plavix.  He stopped taking Plavix 5 days ago.  This was approved by Dr. Mare Perez.  He clearly is motivated to have this repaired because of progressive pain and bulge. Because of his symptoms and the fact that this is recurrence, he is at increased risk for incarceration and strangulation. Despite his comorbidities, I think the safest approach is to repair this electively before he has a complication.    Operative Findings:       He had a recurrent indirect left inguinal hernia.  The indirect sac was opened and inspected and there were no incarcerated contents.  The prior mesh seemed very small and probably had contracted a lot over the years.  Lots of scar tissue but I was able to identify the inguinal ligament from pubic tubercle all the way out laterally and perform a Lichtenstein type repair utilizing a 3" x 6" piece of ultra Pro mesh.  Procedure in Detail:          Dr. Tami Perez performed  a Tapp block.  The patient was taken to the operating room and underwent general endotracheal anesthesia.  The abdomen and genitalia were prepped and draped in a sterile fashion.  Intravenous antibiotic given.  Surgical timeout was performed.  0.5% Marcaine with epinephrine was used to infiltrate the skin and subcutaneous tissue.     Transverse incision was made in the left groin, through the prior scar.  This was extended slightly medially and laterally for exposure.  Dissection was taken down to the external oblique which was identified and cleaned off.  I could identify the  external inguinal ring and slowly opened and incised the external oblique.  With traction and countertraction I dissected all of the underlying tissues off of the external oblique from above  and below.  There is a fair amount of scar tissue inferiorly and I slowly dissected this away until I identified the inguinal ligament from the pubic tubercle to several centimeters lateral to the internal inguinal ring.  I found an indirect hernia sac and dissected that away from the cord structures.  The sac was opened and inspected and found to be empty.  I was  able to pass my finger into the abdominal cavity.  I felt no other abnormalities.  The indirect sac was twisted and suture ligated at the level of the internal ring with a suture ligature of 2-0 Vicryl.  The redundant sac was amputated and discarded.     The floor of the inguinal canal was repaired and reinforced with a 3" x 6" piece of ultra Pro mesh.  The mesh was trimmed at  the corners to accommodate the anatomy of wound and was sutured in place with 2 proline sutures.  The mesh was sutured so as to generously overlap the fascia at the pubic tubercle, then along the inguinal ligament inferiorly.  Medially, superiorly, and superio laterally several mattress sutures of 2-0 proline were placed.  The mesh was incised laterally so as to wraparound the cord structures at the internal ring.  The tails of the mesh were overlapped laterally and further proline sutures were placed laterally.  This provided very secure and broad coverage both medial and lateral to the internal ring but allowed an adequate fingertip opening for the cord structures.  The wound was irrigated with saline.  Hemostasis was excellent.  The external oblique was closed back with a running suture of 2 vicryl.  Scarpa's fascia was closed with 3-0 Vicodin and the skin closed with a running subcuticular 4-0 Monocryl and Dermabond.  The patient tolerated the procedure well was taken to PACU in stable condition.  EBL 15 mL.  Counts correct.  Complications none.         Edsel Petrin. Dalbert Batman, M.D., FACS General and Minimally Invasive Surgery Breast and Colorectal Surgery  01/31/2015 8:59  AM

## 2015-01-31 NOTE — Anesthesia Postprocedure Evaluation (Signed)
  Anesthesia Post-op Note  Patient: [name]  Procedure(s) Performed: Procedure(s): OPEN REPAIR RECURRENT LEFT INGUINAL HERNIA (Left) INSERTION OF MESH (Left)  Patient Location: PACU  Anesthesia Type: General, Regional   Level of Consciousness: awake, alert  and oriented  Airway and Oxygen Therapy: Patient Spontanous Breathing  Post-op Pain: mild  Post-op Assessment: Post-op Vital signs reviewed  Post-op Vital Signs: Reviewed  Last Vitals:  Filed Vitals:   01/31/15 0905  BP: 138/67  Pulse: 77  Temp: 36.3 C  Resp: 12    Complications: No apparent anesthesia complications

## 2015-01-31 NOTE — Anesthesia Preprocedure Evaluation (Signed)
Anesthesia Evaluation  Patient identified by MRN, date of birth, ID band Patient awake    Reviewed: Allergy & Precautions, NPO status , Patient's Chart, lab work & pertinent test results  Airway Mallampati: I  TM Distance: >3 FB Neck ROM: Full    Dental  (+) Teeth Intact, Dental Advisory Given   Pulmonary  breath sounds clear to auscultation        Cardiovascular hypertension, Pt. on medications + angina with exertion + CAD Rhythm:Regular Rate:Normal     Neuro/Psych    GI/Hepatic GERD-  Medicated,  Endo/Other    Renal/GU      Musculoskeletal   Abdominal   Peds  Hematology   Anesthesia Other Findings   Reproductive/Obstetrics                             Anesthesia Physical Anesthesia Plan  ASA: III  Anesthesia Plan: General and Regional   Post-op Pain Management:    Induction: Intravenous  Airway Management Planned: LMA  Additional Equipment:   Intra-op Plan:   Post-operative Plan: Extubation in OR  Informed Consent: I have reviewed the patients History and Physical, chart, labs and discussed the procedure including the risks, benefits and alternatives for the proposed anesthesia with the patient or authorized representative who has indicated his/her understanding and acceptance.   Dental advisory given  Plan Discussed with: CRNA, Anesthesiologist and Surgeon  Anesthesia Plan Comments:         Anesthesia Quick Evaluation

## 2015-01-31 NOTE — Transfer of Care (Signed)
Immediate Anesthesia Transfer of Care Note  Patient: [name]  Procedure(s) Performed: Procedure(s): OPEN REPAIR RECURRENT LEFT INGUINAL HERNIA (Left) INSERTION OF MESH (Left)  Patient Location: PACU  Anesthesia Type:General  Level of Consciousness: awake, alert , oriented and patient cooperative  Airway & Oxygen Therapy: Patient Spontanous Breathing and Patient connected to face mask oxygen  Post-op Assessment: Report given to RN, Post -op Vital signs reviewed and stable and Patient moving all extremities X 4  Post vital signs: Reviewed and stable  Last Vitals:  Filed Vitals:   01/31/15 0631  BP: 121/54  Pulse: 62  Temp: 36.3 C  Resp: 18    Complications: No apparent anesthesia complications

## 2015-02-01 DIAGNOSIS — K4091 Unilateral inguinal hernia, without obstruction or gangrene, recurrent: Secondary | ICD-10-CM | POA: Diagnosis not present

## 2015-02-01 DIAGNOSIS — Z9889 Other specified postprocedural states: Secondary | ICD-10-CM

## 2015-02-01 DIAGNOSIS — Z8719 Personal history of other diseases of the digestive system: Secondary | ICD-10-CM

## 2015-02-01 MED ORDER — HYDROCODONE-ACETAMINOPHEN 5-325 MG PO TABS: 1.0000 | ORAL_TABLET | ORAL | Status: DC | PRN

## 2015-02-01 NOTE — Progress Notes (Signed)
Discharge home. Home discharge instruction given. No question verbalized. 

## 2015-02-01 NOTE — Discharge Summary (Signed)
Physician Discharge Summary  Patient ID: [name] MRN: [id number] DOB/AGE: 04/27/1925 81 y.o.  Admit date: 01/31/2015 Discharge date: 02/01/2015  Admission Diagnoses:  Recurrent left inguinal hernia  Discharge Diagnoses:  Post open repair of left inguinal hernia with mesh  Active Problems:   Status post left inguinal hernia repair June 2016   Surgery:  Open [facility]  Discharged Condition: improved  Hospital Course:   Had surgery by Dr. Dalbert Batman.  Incision and scrotum looking ok on PD 1.  Ready for discharge.  Will start Plavix when he gets home (not in the [facility]).  Consults: [place]  Significant Diagnostic Studies: none    Discharge Exam: Blood pressure 127/51, pulse 90, temperature 98 F (36.7 C), temperature source Oral, resp. rate 14, height 5\' 10"  (1.778 m), weight 77.111 kg (170 lb), SpO2 93 %. Incision OK.  No ecchymosis   Disposition: 01-Home or Self Care  Discharge Instructions    Diet - low sodium heart healthy    Complete by:  As directed      Discharge instructions    Complete by:  As directed   Restart Plavix when you get home today     Increase activity slowly    Complete by:  As directed      No wound care    Complete by:  As directed             Medication List    TAKE these medications        acetaminophen 500 MG tablet  Commonly known as:  TYLENOL  Take 500 mg by mouth at bedtime. Take at bedtime every night per patient     atorvastatin 80 MG tablet  Commonly known as:  LIPITOR  Take 80 mg by mouth every evening.     budesonide-formoterol 80-4.5 MCG/ACT inhaler  Commonly known as:  SYMBICORT  Inhale 2 puffs into the lungs 2 (two) times daily.     calcium-vitamin D 500-200 MG-UNIT per tablet  Commonly known as:  OSCAL WITH D  Take 2 tablets by mouth daily.     docusate sodium 100 MG capsule  Commonly known as:  COLACE  Take 100 mg by mouth daily.     finasteride 5 MG tablet  Commonly known as:  PROSCAR  Take 5 mg by mouth daily.      furosemide 40 MG tablet  Commonly known as:  LASIX  Take 40 mg by mouth every morning.     HYDROcodone-acetaminophen 5-325 MG per tablet  Commonly known as:  NORCO/VICODIN  Take 1-2 tablets by mouth every 4 (four) hours as needed for moderate pain.     ipratropium 17 MCG/ACT inhaler  Commonly known as:  ATROVENT HFA  Inhale 2 puffs into the lungs 2 (two) times daily as needed for wheezing.     isosorbide mononitrate 60 MG 24 hr tablet  Commonly known as:  IMDUR  Take 30-60 mg by mouth daily. Take 90mg  total a day take a whole 60mg  tablet then split one in half to take with the 60mg      multivitamin with minerals tablet  Take 1 tablet by mouth daily.     NITROSTAT 0.4 MG SL tablet  Generic drug:  nitroGLYCERIN  PLACE 1 TABLET UNDER THE TONGUE EVERY 5 MINUTES ASDIRECTED BY PHYSICIAN     potassium chloride SA 20 MEQ tablet  Commonly known as:  K-DUR,KLOR-CON  Take 20 mEq by mouth daily.     ranitidine 150 MG tablet  Commonly known  as:  ZANTAC  Take 1 tablet (150 mg total) by mouth 2 (two) times daily.     senna 8.6 MG tablet  Commonly known as:  SENOKOT  Take 1 tablet by mouth as needed for constipation.     tamsulosin 0.4 MG Caps capsule  Commonly known as:  FLOMAX  Take 1 capsule (0.4 mg total) by mouth daily.     temazepam 15 MG capsule  Commonly known as:  RESTORIL  Take one capsule by mouth every night at bedtime as needed for sleep           Follow-up Information    Follow up with Adin Hector, MD. Schedule an appointment as soon as possible for a visit in 3 weeks.   Specialty:  General Surgery   Contact information:   [place] [address] [phone]       Signed: Pedro Earls 02/01/2015, 10:03 AM

## 2015-02-01 NOTE — Discharge Instructions (Signed)
Inguinal Hernia, Adult  °Care After °Refer to this sheet in the next few weeks. These discharge instructions provide you with general information on caring for yourself after you leave the hospital. Your caregiver may also give you specific instructions. Your treatment has been planned according to the most current medical practices available, but unavoidable complications sometimes occur. If you have any problems or questions after discharge, please call your caregiver. °HOME CARE INSTRUCTIONS °· Put ice on the operative site. °¨ Put ice in a plastic bag. °¨ Place a towel between your skin and the bag. °¨ Leave the ice on for 15-20 minutes at a time, 03-04 times a day while awake. °· Change bandages (dressings) as directed. °· Keep the wound dry and clean. The wound may be washed gently with soap and water. Gently blot or dab the wound dry. It is okay to take showers 24 to 48 hours after surgery. Do not take baths, use swimming pools, or use hot tubs for 10 days, or as directed by your caregiver. °· Only take over-the-counter or prescription medicines for pain, discomfort, or fever as directed by your caregiver. °· Continue your normal diet as directed. °· Do not lift anything more than 10 pounds or play contact sports for 3 weeks, or as directed. °SEEK MEDICAL CARE IF: °· There is redness, swelling, or increasing pain in the wound. °· There is fluid (pus) coming from the wound. °· There is drainage from a wound lasting longer than 1 day. °· You have an oral temperature above 102° F (38.9° C). °· You notice a bad smell coming from the wound or dressing. °· The wound breaks open after the stitches (sutures) have been removed. °· You notice increasing pain in the shoulders (shoulder strap areas). °· You develop dizzy episodes or fainting while standing. °· You feel sick to your stomach (nauseous) or throw up (vomit). °SEEK IMMEDIATE MEDICAL CARE IF: °· You develop a rash. °· You have difficulty breathing. °· You  develop a reaction or have side effects to medicines you were given. °MAKE SURE YOU:  °· Understand these instructions. °· Will watch your condition. °· Will get help right away if you are not doing well or get worse. °Document Released: 09/16/2006 Document Revised: 11/08/2011 Document Reviewed: 07/16/2009 °ExitCare® Patient Information ©2015 [employer]. This information is not intended to replace advice given to you by your health care provider. Make sure you discuss any questions you have with your health care provider. ° °

## 2015-02-03 ENCOUNTER — Encounter (HOSPITAL_COMMUNITY): Payer: Self-pay | Admitting: General Surgery

## 2015-02-24 ENCOUNTER — Other Ambulatory Visit: Payer: Self-pay

## 2015-04-10 ENCOUNTER — Encounter (HOSPITAL_COMMUNITY): Payer: Self-pay | Admitting: *Deleted

## 2015-04-10 ENCOUNTER — Telehealth: Payer: Self-pay | Admitting: Cardiology

## 2015-04-10 ENCOUNTER — Inpatient Hospital Stay (HOSPITAL_COMMUNITY)
Admission: EM | Admit: 2015-04-10 | Discharge: 2015-04-12 | DRG: 243 | Disposition: A | Discharge status: Home / Self Care | Payer: Medicare Other | Attending: Cardiology | Admitting: Cardiology

## 2015-04-10 ENCOUNTER — Emergency Department (HOSPITAL_COMMUNITY): Payer: Medicare Other

## 2015-04-10 DIAGNOSIS — I4892 Unspecified atrial flutter: Secondary | ICD-10-CM | POA: Diagnosis present

## 2015-04-10 DIAGNOSIS — E785 Hyperlipidemia, unspecified: Secondary | ICD-10-CM | POA: Diagnosis present

## 2015-04-10 DIAGNOSIS — I447 Left bundle-branch block, unspecified: Secondary | ICD-10-CM | POA: Diagnosis present

## 2015-04-10 DIAGNOSIS — I10 Essential (primary) hypertension: Secondary | ICD-10-CM | POA: Diagnosis present

## 2015-04-10 DIAGNOSIS — M21371 Foot drop, right foot: Secondary | ICD-10-CM | POA: Diagnosis present

## 2015-04-10 DIAGNOSIS — Z951 Presence of aortocoronary bypass graft: Secondary | ICD-10-CM | POA: Diagnosis not present

## 2015-04-10 DIAGNOSIS — Z955 Presence of coronary angioplasty implant and graft: Secondary | ICD-10-CM

## 2015-04-10 DIAGNOSIS — R296 Repeated falls: Secondary | ICD-10-CM | POA: Diagnosis present

## 2015-04-10 DIAGNOSIS — I739 Peripheral vascular disease, unspecified: Secondary | ICD-10-CM | POA: Diagnosis present

## 2015-04-10 DIAGNOSIS — I495 Sick sinus syndrome: Secondary | ICD-10-CM | POA: Diagnosis present

## 2015-04-10 DIAGNOSIS — K5900 Constipation, unspecified: Secondary | ICD-10-CM | POA: Diagnosis present

## 2015-04-10 DIAGNOSIS — Z923 Personal history of irradiation: Secondary | ICD-10-CM

## 2015-04-10 DIAGNOSIS — J45909 Unspecified asthma, uncomplicated: Secondary | ICD-10-CM | POA: Diagnosis present

## 2015-04-10 DIAGNOSIS — Z96641 Presence of right artificial hip joint: Secondary | ICD-10-CM | POA: Diagnosis present

## 2015-04-10 DIAGNOSIS — K219 Gastro-esophageal reflux disease without esophagitis: Secondary | ICD-10-CM | POA: Diagnosis present

## 2015-04-10 DIAGNOSIS — I200 Unstable angina: Secondary | ICD-10-CM | POA: Diagnosis present

## 2015-04-10 DIAGNOSIS — R079 Chest pain, unspecified: Secondary | ICD-10-CM | POA: Diagnosis present

## 2015-04-10 DIAGNOSIS — Z960 Presence of urogenital implants: Secondary | ICD-10-CM | POA: Diagnosis present

## 2015-04-10 DIAGNOSIS — G4700 Insomnia, unspecified: Secondary | ICD-10-CM | POA: Diagnosis present

## 2015-04-10 DIAGNOSIS — Z7951 Long term (current) use of inhaled steroids: Secondary | ICD-10-CM | POA: Diagnosis not present

## 2015-04-10 DIAGNOSIS — I25110 Atherosclerotic heart disease of native coronary artery with unstable angina pectoris: Secondary | ICD-10-CM | POA: Diagnosis present

## 2015-04-10 DIAGNOSIS — G629 Polyneuropathy, unspecified: Secondary | ICD-10-CM | POA: Diagnosis present

## 2015-04-10 DIAGNOSIS — I252 Old myocardial infarction: Secondary | ICD-10-CM | POA: Diagnosis not present

## 2015-04-10 DIAGNOSIS — Z8249 Family history of ischemic heart disease and other diseases of the circulatory system: Secondary | ICD-10-CM | POA: Diagnosis not present

## 2015-04-10 DIAGNOSIS — I5022 Chronic systolic (congestive) heart failure: Secondary | ICD-10-CM | POA: Diagnosis present

## 2015-04-10 DIAGNOSIS — Z8546 Personal history of malignant neoplasm of prostate: Secondary | ICD-10-CM

## 2015-04-10 DIAGNOSIS — Z95818 Presence of other cardiac implants and grafts: Secondary | ICD-10-CM

## 2015-04-10 DIAGNOSIS — E784 Other hyperlipidemia: Secondary | ICD-10-CM | POA: Diagnosis not present

## 2015-04-10 DIAGNOSIS — I2510 Atherosclerotic heart disease of native coronary artery without angina pectoris: Secondary | ICD-10-CM | POA: Diagnosis not present

## 2015-04-10 HISTORY — DX: Unspecified atrial flutter: I48.92

## 2015-04-10 LAB — CBC WITH DIFFERENTIAL/PLATELET
Basophils Absolute: 0 10*3/uL (ref 0.0–0.1)
Basophils Relative: 1 % (ref 0–1)
EOS ABS: 0.1 10*3/uL (ref 0.0–0.7)
Eosinophils Relative: 2 % (ref 0–5)
HCT: 38.7 % — ABNORMAL LOW (ref 39.0–52.0)
HEMOGLOBIN: 13.3 g/dL (ref 13.0–17.0)
Lymphocytes Relative: 16 % (ref 12–46)
Lymphs Abs: 0.9 10*3/uL (ref 0.7–4.0)
MCH: 33.8 pg (ref 26.0–34.0)
MCHC: 34.4 g/dL (ref 30.0–36.0)
MCV: 98.5 fL (ref 78.0–100.0)
Monocytes Absolute: 0.5 10*3/uL (ref 0.1–1.0)
Monocytes Relative: 9 % (ref 3–12)
Neutro Abs: 4.3 10*3/uL (ref 1.7–7.7)
Neutrophils Relative %: 72 % (ref 43–77)
Platelets: 186 10*3/uL (ref 150–400)
RBC: 3.93 MIL/uL — ABNORMAL LOW (ref 4.22–5.81)
RDW: 12.9 % (ref 11.5–15.5)
WBC: 5.9 10*3/uL (ref 4.0–10.5)

## 2015-04-10 LAB — I-STAT CHEM 8, ED
BUN: 11 mg/dL (ref 6–20)
Calcium, Ion: 1.17 mmol/L (ref 1.13–1.30)
Chloride: 99 mmol/L — ABNORMAL LOW (ref 101–111)
Creatinine, Ser: 0.8 mg/dL (ref 0.61–1.24)
Glucose, Bld: 119 mg/dL — ABNORMAL HIGH (ref 65–99)
HCT: 40 % (ref 39.0–52.0)
HEMOGLOBIN: 13.6 g/dL (ref 13.0–17.0)
Potassium: 4.4 mmol/L (ref 3.5–5.1)
Sodium: 133 mmol/L — ABNORMAL LOW (ref 135–145)
TCO2: 23 mmol/L (ref 0–100)

## 2015-04-10 LAB — I-STAT TROPONIN, ED: TROPONIN I, POC: 0 ng/mL (ref 0.00–0.08)

## 2015-04-10 LAB — HEPARIN LEVEL (UNFRACTIONATED): Heparin Unfractionated: 0.34 IU/mL (ref 0.30–0.70)

## 2015-04-10 LAB — T4, FREE: Free T4: 0.71 ng/dL (ref 0.61–1.12)

## 2015-04-10 LAB — MRSA PCR SCREENING: MRSA by PCR: NEGATIVE

## 2015-04-10 LAB — MAGNESIUM: Magnesium: 2 mg/dL (ref 1.7–2.4)

## 2015-04-10 LAB — TROPONIN I: Troponin I: 0.03 ng/mL (ref <0.031)

## 2015-04-10 MED ORDER — NITROGLYCERIN IN D5W 200-5 MCG/ML-% IV SOLN
3.0000 ug/min | INTRAVENOUS | Status: DC
  Administered 2015-04-10: 10 ug/min via INTRAVENOUS

## 2015-04-10 MED ORDER — NITROGLYCERIN 0.4 MG SL SUBL: 0.4000 mg | SUBLINGUAL_TABLET | SUBLINGUAL | Status: DC | PRN

## 2015-04-10 MED ORDER — NITROGLYCERIN IN D5W 200-5 MCG/ML-% IV SOLN
0.0000 ug/min | Freq: Once | INTRAVENOUS | Status: AC
  Administered 2015-04-10: 5 ug/min via INTRAVENOUS
  Filled 2015-04-10: qty 250

## 2015-04-10 MED ORDER — ASPIRIN 300 MG RE SUPP: 300.0000 mg | RECTAL | Status: AC

## 2015-04-10 MED ORDER — SERTRALINE HCL 50 MG PO TABS
150.0000 mg | ORAL_TABLET | Freq: Every day | ORAL | Status: DC
  Administered 2015-04-10 – 2015-04-12 (×3): 150 mg via ORAL
  Filled 2015-04-10 (×6): qty 1

## 2015-04-10 MED ORDER — ONDANSETRON HCL 4 MG/2ML IJ SOLN: 4.0000 mg | Freq: Four times a day (QID) | INTRAMUSCULAR | Status: DC | PRN

## 2015-04-10 MED ORDER — METOPROLOL SUCCINATE ER 25 MG PO TB24
12.5000 mg | ORAL_TABLET | Freq: Every day | ORAL | Status: DC
  Administered 2015-04-10 – 2015-04-12 (×3): 12.5 mg via ORAL
  Filled 2015-04-10 (×3): qty 1

## 2015-04-10 MED ORDER — HEPARIN BOLUS VIA INFUSION
4000.0000 [IU] | Freq: Once | INTRAVENOUS | Status: AC
  Administered 2015-04-10: 4000 [IU] via INTRAVENOUS
  Filled 2015-04-10: qty 4000

## 2015-04-10 MED ORDER — SODIUM CHLORIDE 0.9 % IV SOLN: INTRAVENOUS | Status: DC

## 2015-04-10 MED ORDER — POTASSIUM CHLORIDE CRYS ER 20 MEQ PO TBCR
20.0000 meq | EXTENDED_RELEASE_TABLET | Freq: Every day | ORAL | Status: DC
  Administered 2015-04-11 – 2015-04-12 (×2): 20 meq via ORAL
  Filled 2015-04-10 (×3): qty 1

## 2015-04-10 MED ORDER — ATORVASTATIN CALCIUM 40 MG PO TABS
40.0000 mg | ORAL_TABLET | Freq: Every day | ORAL | Status: DC
  Administered 2015-04-11: 40 mg via ORAL
  Filled 2015-04-10: qty 1

## 2015-04-10 MED ORDER — CALCIUM CARBONATE-VITAMIN D 500-200 MG-UNIT PO TABS
2.0000 | ORAL_TABLET | Freq: Every day | ORAL | Status: DC
  Administered 2015-04-11 – 2015-04-12 (×2): 2 via ORAL
  Filled 2015-04-10 (×3): qty 2

## 2015-04-10 MED ORDER — SENNA 8.6 MG PO TABS: 1.0000 | ORAL_TABLET | Freq: Every day | ORAL | Status: DC | PRN

## 2015-04-10 MED ORDER — ASPIRIN EC 81 MG PO TBEC
81.0000 mg | DELAYED_RELEASE_TABLET | Freq: Every day | ORAL | Status: DC
  Administered 2015-04-11 – 2015-04-12 (×2): 81 mg via ORAL
  Filled 2015-04-10 (×3): qty 1

## 2015-04-10 MED ORDER — IPRATROPIUM BROMIDE 0.02 % IN SOLN: 0.5000 mg | Freq: Four times a day (QID) | RESPIRATORY_TRACT | Status: DC | PRN

## 2015-04-10 MED ORDER — TEMAZEPAM 15 MG PO CAPS
15.0000 mg | ORAL_CAPSULE | Freq: Every evening | ORAL | Status: DC | PRN
  Administered 2015-04-10 – 2015-04-11 (×2): 15 mg via ORAL
  Filled 2015-04-10 (×2): qty 1

## 2015-04-10 MED ORDER — FAMOTIDINE 20 MG PO TABS
20.0000 mg | ORAL_TABLET | Freq: Two times a day (BID) | ORAL | Status: DC
  Administered 2015-04-10 – 2015-04-12 (×4): 20 mg via ORAL
  Filled 2015-04-10 (×6): qty 1

## 2015-04-10 MED ORDER — TAMSULOSIN HCL 0.4 MG PO CAPS
0.4000 mg | ORAL_CAPSULE | Freq: Every day | ORAL | Status: DC
  Administered 2015-04-10 – 2015-04-12 (×2): 0.4 mg via ORAL
  Filled 2015-04-10 (×4): qty 1

## 2015-04-10 MED ORDER — HEPARIN (PORCINE) IN NACL 100-0.45 UNIT/ML-% IJ SOLN
1000.0000 [IU]/h | INTRAMUSCULAR | Status: DC
  Administered 2015-04-10: 900 [IU]/h via INTRAVENOUS
  Filled 2015-04-10 (×2): qty 250

## 2015-04-10 MED ORDER — ACETAMINOPHEN 325 MG PO TABS: 650.0000 mg | ORAL_TABLET | ORAL | Status: DC | PRN

## 2015-04-10 MED ORDER — FINASTERIDE 5 MG PO TABS
5.0000 mg | ORAL_TABLET | Freq: Every day | ORAL | Status: DC
  Administered 2015-04-10 – 2015-04-12 (×2): 5 mg via ORAL
  Filled 2015-04-10 (×4): qty 1

## 2015-04-10 MED ORDER — ACETAMINOPHEN 500 MG PO TABS
500.0000 mg | ORAL_TABLET | Freq: Every day | ORAL | Status: DC
Start: 2015-04-10 — End: 2015-04-12
  Administered 2015-04-10 – 2015-04-11 (×2): 500 mg via ORAL
  Filled 2015-04-10 (×3): qty 1

## 2015-04-10 MED ORDER — ASPIRIN 81 MG PO CHEW: 324.0000 mg | CHEWABLE_TABLET | ORAL | Status: AC

## 2015-04-10 MED ORDER — DOCUSATE SODIUM 100 MG PO CAPS
100.0000 mg | ORAL_CAPSULE | Freq: Every day | ORAL | Status: DC
  Administered 2015-04-11 – 2015-04-12 (×2): 100 mg via ORAL
  Filled 2015-04-10 (×3): qty 1

## 2015-04-10 MED ORDER — BUDESONIDE-FORMOTEROL FUMARATE 80-4.5 MCG/ACT IN AERO
2.0000 | INHALATION_SPRAY | Freq: Two times a day (BID) | RESPIRATORY_TRACT | Status: DC
  Administered 2015-04-10 – 2015-04-12 (×4): 2 via RESPIRATORY_TRACT
  Filled 2015-04-10 (×2): qty 6.9

## 2015-04-10 MED ORDER — CARBAMAZEPINE 100 MG PO CHEW
300.0000 mg | CHEWABLE_TABLET | Freq: Two times a day (BID) | ORAL | Status: DC
  Administered 2015-04-10 – 2015-04-12 (×4): 300 mg via ORAL
  Filled 2015-04-10 (×7): qty 3

## 2015-04-10 MED ORDER — IPRATROPIUM BROMIDE HFA 17 MCG/ACT IN AERS: 2.0000 | INHALATION_SPRAY | Freq: Four times a day (QID) | RESPIRATORY_TRACT | Status: DC | PRN

## 2015-04-10 MED ORDER — ADULT MULTIVITAMIN W/MINERALS CH
1.0000 | ORAL_TABLET | Freq: Every day | ORAL | Status: DC
  Administered 2015-04-11 – 2015-04-12 (×2): 1 via ORAL
  Filled 2015-04-10 (×3): qty 1

## 2015-04-10 MED ORDER — FUROSEMIDE 40 MG PO TABS
40.0000 mg | ORAL_TABLET | Freq: Every morning | ORAL | Status: DC
Start: 2015-04-11 — End: 2015-04-12
  Administered 2015-04-11 – 2015-04-12 (×2): 40 mg via ORAL
  Filled 2015-04-10 (×3): qty 1

## 2015-04-10 NOTE — ED Notes (Signed)
XRAY AT BEDSIDE

## 2015-04-10 NOTE — ED Provider Notes (Signed)
CSN: [id number]     Arrival date & time 04/10/15  7829 History   First MD Initiated Contact with Patient 04/10/15 [phone]     Chief Complaint  Patient presents with  . Chest Pain     (Consider location/radiation/quality/duration/timing/severity/associated sxs/prior Treatment) Patient is a 81 y.o. male presenting with chest pain. The history is provided by the patient.  Chest Pain Pain location:  Substernal area Associated symptoms: diaphoresis and nausea   Associated symptoms: no abdominal pain, no back pain, no headache, no numbness, no shortness of breath, not vomiting and no weakness    patient presents with chest pain. Has been in his mid chest and feels like pressure. Feels like his previous anginal pain. Began today. Had had some episodes of it yesterday to have clear. Mild shortness of breath. No swelling in his legs. States his heart rate is higher than normal. He had 5 nitroglycerin prehospital and now feels much better. Slight nausea and had some sweating. No fevers. No chills.  Past Medical History  Diagnosis Date  . Systolic CHF     takes Furosemide daily  . Dyslipidemia (high LDL; low HDL)     takes Atorvastatin daily  . Heart murmur   . Peripheral vascular disease     thrombus- in leg- many yrs. ago- ?R- coumadin x1 mth  . Peripheral neuropathy   . Hiatal hernia   . Depression     uses Zoloft   . VT (ventricular tachycardia) 06/2012    in the setting of STEMI  . Prostatitis   . Hypertension     takes Imdur and Metroprolol daily  . Coronary artery disease     takes Plavix daily   . Constipation     takes Colace daily  . Insomnia     takes Restoril nightly as needed  . Enlarged prostate     takes Flomax daily and Sennokot daily as needed  . GERD (gastroesophageal reflux disease)     takes Zantac daily  . Myocardial infarction     several with most recent one in 2013  . Anginal pain     last took Nitroglycerin 3wks ago-took 2 and relieved symtoms  . Cough      chest cold recent  . Peripheral neuropathy     takes tegretol daily  . Joint pain   . Joint swelling   . Diverticulosis   . Urinary frequency   . History of blood transfusion     "related to hip OR and bypass OR"  . Kidney stones   . Complication of anesthesia     B/P dropped low and had to stay in recovery longer  . Hyperlipemia   . CHF (congestive heart failure) 2010  . Asthma     uses Symbicort daily and also has Albuterol prn  . Pneumonia 1960's  . On home oxygen therapy     "2L; just at night" (01/31/2015)  . Anemia 2014    was on iron pills and then was able to come off  . Hepatitis     hepatitis- long time ago, occupational contamination   . Headache     "weekly" (01/31/2015)  . Arthritis     "all over"  . Prostate cancer     radiation therapy- 2002   Past Surgical History  Procedure Laterality Date  . Ptca  2008  . Cardiac catheterization  "several"  . Cataract extraction w/ intraocular lens  implant, bilateral    . Fracture surgery  heel- crushed -2002,  (hardware)   . Joint replacement      R great toe  . Rhinoplasty  [id number]  . Lumbar laminectomy/decompression microdiscectomy  08/10/2011    Procedure: LUMBAR LAMINECTOMY/DECOMPRESSION MICRODISCECTOMY;  Surgeon: Cooper Render Pool;  Location: [facility];  Service: Neurosurgery;  Laterality: Right;  RIGHT Lumbar five-sacral one LAMINECTOMY, MICRODISCECTOMY  . Lumbar wound debridement  08/22/2011    Procedure: LUMBAR WOUND DEBRIDEMENT;  Surgeon: Eustace Moore;  Location: [place] [facility];  Service: Neurosurgery;  Laterality: N/A;  Repair of CSF Leak requiring laminectomy  . Cystoscopy w/ ureteral stent placement Right 02/28/2013    Procedure: CYSTOSCOPY WITH RETROGRADE PYELOGRAM/URETERAL STENT PLACEMENT;  Surgeon: Dutch Gray, MD;  Location: [facility];  Service: Urology;  Laterality: Right;  . Tonsillectomy  1940  . Appendectomy  1943  . Hammer toe surgery Right     little toe  . Transurethral resection of prostate  x2   . Foot fusion Left 2000    heel  . Hardware removal Right 2002    great toe; Dr. Sharol Given  . Cystoscopy with ureteroscopy and stent placement Right 03/23/2013    Procedure: RIGHT URETEROSCOPY, LASER LITHO AND STENT PLACEMENT;  Surgeon: Fredricka Bonine, MD;  Location: [facility];  Service: Urology;  Laterality: Right;  . Holmium laser application Right 2/53/6644    Procedure: HOLMIUM LASER APPLICATION;  Surgeon: Fredricka Bonine, MD;  Location: [facility];  Service: Urology;  Laterality: Right;  . Intramedullary (im) nail intertrochanteric Right 08/29/2013    Procedure: INTRAMEDULLARY (IM) NAIL INTERTROCHANTRIC;  Surgeon: Marianna Payment, MD;  Location: [place];  Service: Orthopedics;  Laterality: Right;  . Left and right heart catheterization with coronary angiogram N/A 07/15/2012    Procedure: LEFT AND RIGHT HEART CATHETERIZATION WITH CORONARY ANGIOGRAM;  Surgeon: Jettie Booze, MD;  Location: [facility] C[facility] LAB;  Service: Cardiovascular;  Laterality: N/A;  . Percutaneous coronary stent intervention (pci-s) N/A 07/15/2012    Procedure: PERCUTANEOUS CORONARY STENT INTERVENTION (PCI-S);  Surgeon: Jettie Booze, MD;  Location: [facility] C[facility] LAB;  Service: Cardiovascular;  Laterality: N/A;  . Coronary angioplasty with stent placement      "2 + 1 + 1"  . Coronary artery bypass graft  1986;  1993    CABG X 5; CABG X 1  . Colonoscopy    . Inguinal hernia repair Bilateral   . Inguinal hernia repair Left 01/31/2015    open repair recurrent inguinal hernia w/mesh/notes 01/31/2015  . Back surgery    . Knee arthroscopy Right X 2  . Toe fusion Right     great toe; Dr. Sharol Given  . Inguinal hernia repair Left 01/31/2015    Procedure: OPEN REPAIR RECURRENT LEFT INGUINAL HERNIA;  Surgeon: Fanny Skates, MD;  Location: [facility];  Service: General;  Laterality: Left;  . Insertion of mesh Left 01/31/2015    Procedure: INSERTION OF MESH;  Surgeon: Fanny Skates, MD;  Location: [facility] OR;  Service: General;  Laterality:  Left;   Family History  Problem Relation Age of Onset  . Heart attack Mother   . Addison's disease Father   . Heart attack Sister   . Anesthesia problems Neg Hx   . Hypotension Neg Hx   . Malignant hyperthermia Neg Hx   . Pseudochol deficiency Neg Hx    Social History  Substance Use Topics  . Smoking status: Never Smoker   . Smokeless tobacco: Never Used  . Alcohol Use: No    Review of Systems  Constitutional: Positive for diaphoresis. Negative for activity change and appetite change.  Eyes: Negative for pain.  Respiratory: Negative for chest tightness and shortness of breath.   Cardiovascular: Positive for chest pain. Negative for leg swelling.  Gastrointestinal: Positive for nausea. Negative for vomiting, abdominal pain and diarrhea.  Genitourinary: Negative for flank pain.  Musculoskeletal: Negative for back pain and neck stiffness.  Skin: Negative for rash.  Neurological: Negative for weakness, numbness and headaches.  Psychiatric/Behavioral: Negative for behavioral problems.      Allergies  Review of patient's allergies indicates no known allergies.  Home Medications   Prior to Admission medications   Medication Sig Start Date End Date Taking? Authorizing Provider  acetaminophen (TYLENOL) 500 MG tablet Take 500 mg by mouth at bedtime. Take at bedtime every night per patient   Yes Historical Provider, MD  atorvastatin (LIPITOR) 40 MG tablet Take 40 mg by mouth daily at 6 PM.   Yes Historical Provider, MD  budesonide-formoterol (SYMBICORT) 80-4.5 MCG/ACT inhaler Inhale 2 puffs into the lungs 2 (two) times daily. 09/12/12  Yes Tanda Rockers, MD  calcium-vitamin D (OSCAL WITH D) 500-200 MG-UNIT per tablet Take 2 tablets by mouth daily.   Yes Historical Provider, MD  carbamazepine (TEGRETOL) 100 MG chewable tablet Chew 300 mg by mouth 2 (two) times daily.   Yes Historical Provider, MD  docusate sodium (COLACE) 100 MG capsule Take 100 mg by mouth daily.   Yes Historical  Provider, MD  finasteride (PROSCAR) 5 MG tablet Take 5 mg by mouth daily.    Yes Historical Provider, MD  furosemide (LASIX) 40 MG tablet Take 40 mg by mouth every morning.   Yes Historical Provider, MD  ipratropium (ATROVENT HFA) 17 MCG/ACT inhaler Inhale 2 puffs into the lungs 2 (two) times daily as needed for wheezing.    Yes Historical Provider, MD  isosorbide mononitrate (IMDUR) 60 MG 24 hr tablet Take 90 mg by mouth daily.    Yes Historical Provider, MD  metoprolol succinate (TOPROL-XL) 12.5 mg TB24 24 hr tablet Take 12.5 mg by mouth daily.   Yes Historical Provider, MD  Multiple Vitamins-Minerals (MULTIVITAMIN WITH MINERALS) tablet Take 1 tablet by mouth daily.   Yes Historical Provider, MD  NITROSTAT 0.4 MG SL tablet PLACE 1 TABLET UNDER THE TONGUE EVERY 5 MINUTES ASDIRECTED BY PHYSICIAN 01/07/15  Yes Darlin Coco, MD  potassium chloride SA (K-DUR,KLOR-CON) 20 MEQ tablet Take 20 mEq by mouth daily.    Yes Historical Provider, MD  ranitidine (ZANTAC) 150 MG tablet Take 1 tablet (150 mg total) by mouth 2 (two) times daily. 10/11/14  Yes Darlin Coco, MD  senna (SENOKOT) 8.6 MG tablet Take 1 tablet by mouth daily as needed for constipation.    Yes Historical Provider, MD  sertraline (ZOLOFT) 50 MG tablet Take 150 mg by mouth daily.   Yes Historical Provider, MD  tamsulosin (FLOMAX) 0.4 MG CAPS capsule Take 1 capsule (0.4 mg total) by mouth daily. 09/06/14  Yes Wanship N Rumley, DO  temazepam (RESTORIL) 15 MG capsule Take one capsule by mouth every night at bedtime as needed for sleep 01/17/15  Yes Darlin Coco, MD  HYDROcodone-acetaminophen (NORCO/VICODIN) 5-325 MG per tablet Take 1-2 tablets by mouth every 4 (four) hours as needed for moderate pain. Patient not taking: Reported on 04/10/2015 02/01/15   Johnathan Hausen, MD   BP 142/79 mmHg  Pulse 100  Temp(Src) 98.2 F (36.8 C) (Oral)  Resp 14  Ht 5\' 10"  (1.778 m)  Wt 169 lb (  76.658 kg)  BMI 24.25 kg/m2  SpO2 95% Physical Exam   Constitutional: He appears well-developed and well-nourished.  HENT:  Head: Atraumatic.  Cardiovascular: Normal rate and regular rhythm.   Pulmonary/Chest: Effort normal.  Abdominal: Soft. There is no tenderness.  Musculoskeletal: Normal range of motion.  Neurological: He is alert.  Skin: Skin is warm.    ED Course  Procedures (including critical care time) Labs Review Labs Reviewed  CBC WITH DIFFERENTIAL/PLATELET - Abnormal; Notable for the following:    RBC 3.93 (*)    HCT 38.7 (*)    All other components within normal limits  I-STAT CHEM 8, ED - Abnormal; Notable for the following:    Sodium 133 (*)    Chloride 99 (*)    Glucose, Bld 119 (*)    All other components within normal limits  HEPARIN LEVEL (UNFRACTIONATED)  Randolm Idol, ED    Imaging Review Dg Chest Portable 1 View  04/10/2015   CLINICAL DATA:  Chest pain.  EXAM: PORTABLE CHEST - 1 VIEW  COMPARISON:  01/23/2015  FINDINGS: Sequelae of prior CABG are again identified. Cardiac silhouette is upper limits of normal in size. Thoracic aortic calcification is again seen. Chronic coarsening of the interstitial markings is again noted diffusely and is similar to the prior study. Mild scarring is noted in the lung bases. No confluent airspace opacity, overt edema, pleural effusion, or pneumothorax is identified. No acute osseous abnormality is seen.  IMPRESSION: No active disease.   Electronically Signed   By: Logan Bores   On: 04/10/2015 11:00     EKG Interpretation   Date/Time:  Thursday April 10 2015 09:48:28 EDT Ventricular Rate:  100 PR Interval:  143 QRS Duration: 182 QT Interval:  442 QTC Calculation: 570 R Axis:   123 Text Interpretation:  Sinus tachycardia Nonspecific intraventricular  conduction delay Left bundle branch block Confirmed by Alvino Chapel  MD,  Merwin Breden [phone]) on 04/10/2015 10:00:07 AM      MDM   Final diagnoses:  None    EKG shows old left bundle branch block. Pain like previous  angina. Improved after nitroglycerin sublingually but returned and was started on drip. Patient feels much better on the drip. Heparin also started. Initial troponin negative. Will admit and have discussed with cardiology.  CRITICAL CARE Performed by: Mackie Pai Total critical care time: 30 Critical care time was exclusive of separately billable procedures and treating other patients. Critical care was necessary to treat or prevent imminent or life-threatening deterioration. Critical care was time spent personally by me on the following activities: development of treatment plan with patient and/or surrogate as well as nursing, discussions with consultants, evaluation of patient's response to treatment, examination of patient, obtaining history from patient or surrogate, ordering and performing treatments and interventions, ordering and review of laboratory studies, ordering and review of radiographic studies, pulse oximetry and re-evaluation of patient's condition.     Davonna Belling, MD 04/10/15 1230

## 2015-04-10 NOTE — H&P (Signed)
[name] is an 81 y.o. male.    Primary Cardiologist:Dr. Mare Ferrari  PCP: Paula Compton, MD  Chief Complaint: chest pain and racing HR HPI: 81 year old male with a history of extensive ischemic heart disease. He had coronary artery bypass graft surgery in 1986. He had a redo CABG in 1993. He had PTCA with stent in October 2008. He had catheter with a drug-eluting stent in February 2010. He had a small non-Q MI in August 2010. He presented to the emergency room on 07/15/12 after falling at home. While in the emergency room he developed chest pain followed by ventricular tachycardia requiring cardioversion. He was taken emergently to the catheter lab treated with a bare-metal stent to the mid LAD. He was treated initially with amiodarone which was subsequently stopped prior to discharge. His echocardiogram November 2013 showed ejection fraction 40-45% and moderate left atrial enlargement. He has chronic LBBB.   09/05/14 following an episode of syncope and chest pain. Had upsetting event earlier in the day of admission. Orthostatic vital signs negative. He had recently started on increased dose of Cardura, which is suspected to contribute to syncopal episode. Cardura discontinued during hospitalization. He is now on Flomax instead  2 days ago he noticed his HR was elevated, up to 90-100 and normally in the 50-60 range and at times 35- that was symptomatic with fatigue.  He have not yet been able to document the brady.  Holter in Jan was SR with PACs.   Today he also developed chest pain. Pretty severe with radiation to Lt arm.   Now on IV NTG drip and heparin.   CHA2DS2VASc score of 3- but with freq falls.   Last Echo 08/2014 with EF 55-60%, G1DD Aortic valve: Sclerosis without stenosis. There was no regurgitation. - Mitral valve: Mildly thickened leaflets . There was trivial regurgitation. - Left atrium: Moderately dilated at 41 ml/m2. - Pulmonary arteries: PA peak  pressure: 33 mm Hg (S).  Last Nuc 08/2014 1. Large territory infarct involving the lateral wall of the left ventricle. 2. No scintigraphic evidence of pharmacologically induced ischemia. 3. Mild global hypokinesia. Ejection fraction - 58%.  EKG today with a flutter RVR at 101 LBBB.  At times he has 4-5 flutter waves to QRS but mostly 3:1 flutter.   Past Medical History  Diagnosis Date  . Systolic CHF     takes Furosemide daily  . Dyslipidemia (high LDL; low HDL)     takes Atorvastatin daily  . Heart murmur   . Peripheral vascular disease     thrombus- in leg- many yrs. ago- ?R- coumadin x1 mth  . Peripheral neuropathy   . Hiatal hernia   . Depression     uses Zoloft   . VT (ventricular tachycardia) 06/2012    in the setting of STEMI  . Prostatitis   . Hypertension     takes Imdur and Metroprolol daily  . Coronary artery disease     takes Plavix daily   . Constipation     takes Colace daily  . Insomnia     takes Restoril nightly as needed  . Enlarged prostate     takes Flomax daily and Sennokot daily as needed  . GERD (gastroesophageal reflux disease)     takes Zantac daily  . Myocardial infarction     several with most recent one in 2013  . Anginal pain     last took Nitroglycerin 3wks ago-took 2  and relieved symtoms  . Cough     chest cold recent  . Peripheral neuropathy     takes tegretol daily  . Joint pain   . Joint swelling   . Diverticulosis   . Urinary frequency   . History of blood transfusion     "related to hip OR and bypass OR"  . Kidney stones   . Complication of anesthesia     B/P dropped low and had to stay in recovery longer  . Hyperlipemia   . CHF (congestive heart failure) 2010  . Asthma     uses Symbicort daily and also has Albuterol prn  . Pneumonia 1960's  . On home oxygen therapy     "2L; just at night" (01/31/2015)  . Anemia 2014    was on iron pills and then was able to come off  . Hepatitis     hepatitis- long time ago,  occupational contamination   . Headache     "weekly" (01/31/2015)  . Arthritis     "all over"  . Prostate cancer     radiation therapy- 2002    Past Surgical History  Procedure Laterality Date  . Ptca  2008  . Cardiac catheterization  "several"  . Cataract extraction w/ intraocular lens  implant, bilateral    . Fracture surgery      heel- crushed -2002,  (hardware)   . Joint replacement      R great toe  . Rhinoplasty  [id number]  . Lumbar laminectomy/decompression microdiscectomy  08/10/2011    Procedure: LUMBAR LAMINECTOMY/DECOMPRESSION MICRODISCECTOMY;  Surgeon: Cooper Render Pool;  Location: [facility];  Service: Neurosurgery;  Laterality: Right;  RIGHT Lumbar five-sacral one LAMINECTOMY, MICRODISCECTOMY  . Lumbar wound debridement  08/22/2011    Procedure: LUMBAR WOUND DEBRIDEMENT;  Surgeon: Eustace Moore;  Location: [facility];  Service: Neurosurgery;  Laterality: N/A;  Repair of CSF Leak requiring laminectomy  . Cystoscopy w/ ureteral stent placement Right 02/28/2013    Procedure: CYSTOSCOPY WITH RETROGRADE PYELOGRAM/URETERAL STENT PLACEMENT;  Surgeon: Dutch Gray, MD;  Location: [facility];  Service: Urology;  Laterality: Right;  . Tonsillectomy  1940  . Appendectomy  1943  . Hammer toe surgery Right     little toe  . Transurethral resection of prostate  x2  . Foot fusion Left 2000    heel  . Hardware removal Right 2002    great toe; Dr. Sharol Given  . Cystoscopy with ureteroscopy and stent placement Right 03/23/2013    Procedure: RIGHT URETEROSCOPY, LASER LITHO AND STENT PLACEMENT;  Surgeon: Fredricka Bonine, MD;  Location: [facility];  Service: Urology;  Laterality: Right;  . Holmium laser application Right 5/62/5638    Procedure: HOLMIUM LASER APPLICATION;  Surgeon: Fredricka Bonine, MD;  Location: [facility];  Service: Urology;  Laterality: Right;  . Intramedullary (im) nail intertrochanteric Right 08/29/2013    Procedure: INTRAMEDULLARY (IM) NAIL INTERTROCHANTRIC;  Surgeon:  Marianna Payment, MD;  Location: [place] [facility];  Service: Orthopedics;  Laterality: Right;  . Left and right heart catheterization with coronary angiogram N/A 07/15/2012    Procedure: LEFT AND RIGHT HEART CATHETERIZATION WITH CORONARY ANGIOGRAM;  Surgeon: Jettie Booze, MD;  Location: [facility] CATH LAB;  Service: Cardiovascular;  Laterality: N/A;  . Percutaneous coronary stent intervention (pci-s) N/A 07/15/2012    Procedure: PERCUTANEOUS CORONARY STENT INTERVENTION (PCI-S);  Surgeon: Jettie Booze, MD;  Location: [facility] C[facility] LAB;  Service: Cardiovascular;  Laterality: N/A;  . Coronary angioplasty with stent placement      "  2 + 1 + 1"  . Coronary artery bypass graft  1986;  1993    CABG X 5; CABG X 1  . Colonoscopy    . Inguinal hernia repair Bilateral   . Inguinal hernia repair Left 01/31/2015    open repair recurrent inguinal hernia w/mesh/notes 01/31/2015  . Back surgery    . Knee arthroscopy Right X 2  . Toe fusion Right     great toe; Dr. Sharol Given  . Inguinal hernia repair Left 01/31/2015    Procedure: OPEN REPAIR RECURRENT LEFT INGUINAL HERNIA;  Surgeon: Fanny Skates, MD;  Location: [place];  Service: General;  Laterality: Left;  . Insertion of mesh Left 01/31/2015    Procedure: INSERTION OF MESH;  Surgeon: Fanny Skates, MD;  Location: [facility];  Service: General;  Laterality: Left;    Family History  Problem Relation Age of Onset  . Heart attack Mother   . Addison's disease Father   . Heart attack Sister   . Anesthesia problems Neg Hx   . Hypotension Neg Hx   . Malignant hyperthermia Neg Hx   . Pseudochol deficiency Neg Hx    Social History:  reports that he has never smoked. He has never used smokeless tobacco. He reports that he does not drink alcohol or use illicit drugs.  Allergies: No Known Allergies  OUTPATIENT MEDICATIONS: No current facility-administered medications on file prior to encounter.   Current Outpatient Prescriptions on File Prior to Encounter  Medication Sig  Dispense Refill  . acetaminophen (TYLENOL) 500 MG tablet Take 500 mg by mouth at bedtime. Take at bedtime every night per patient    . budesonide-formoterol (SYMBICORT) 80-4.5 MCG/ACT inhaler Inhale 2 puffs into the lungs 2 (two) times daily.    . calcium-vitamin D (OSCAL WITH D) 500-200 MG-UNIT per tablet Take 2 tablets by mouth daily.    [name] docusate sodium (COLACE) 100 MG capsule Take 100 mg by mouth daily.    . finasteride (PROSCAR) 5 MG tablet Take 5 mg by mouth daily.     . furosemide (LASIX) 40 MG tablet Take 40 mg by mouth every morning.    [name] ipratropium (ATROVENT HFA) 17 MCG/ACT inhaler Inhale 2 puffs into the lungs 2 (two) times daily as needed for wheezing.     . isosorbide mononitrate (IMDUR) 60 MG 24 hr tablet Take 90 mg by mouth daily.     . Multiple Vitamins-Minerals (MULTIVITAMIN WITH MINERALS) tablet Take 1 tablet by mouth daily.    [name] NITROSTAT 0.4 MG SL tablet PLACE 1 TABLET UNDER THE TONGUE EVERY 5 MINUTES ASDIRECTED BY PHYSICIAN 25 tablet 3  . potassium chloride SA (K-DUR,KLOR-CON) 20 MEQ tablet Take 20 mEq by mouth daily.     . ranitidine (ZANTAC) 150 MG tablet Take 1 tablet (150 mg total) by mouth 2 (two) times daily. 180 tablet 3  . senna (SENOKOT) 8.6 MG tablet Take 1 tablet by mouth daily as needed for constipation.     . tamsulosin (FLOMAX) 0.4 MG CAPS capsule Take 1 capsule (0.4 mg total) by mouth daily. 30 capsule 0  . temazepam (RESTORIL) 15 MG capsule Take one capsule by mouth every night at bedtime as needed for sleep 30 capsule 5  . HYDROcodone-acetaminophen (NORCO/VICODIN) 5-325 MG per tablet Take 1-2 tablets by mouth every 4 (four) hours as needed for moderate pain. (Patient not taking: Reported on 04/10/2015) 30 tablet 0     Results for orders placed or performed during the hospital encounter of 04/10/15 (from  the past 48 hour(s))  CBC with Differential     Status: Abnormal   Collection Time: 04/10/15 10:32 AM  Result Value Ref Range   WBC 5.9 4.0 - 10.5 K/uL     RBC 3.93 (L) 4.22 - 5.81 MIL/uL   Hemoglobin 13.3 13.0 - 17.0 g/dL   HCT 38.7 (L) 39.0 - 52.0 %   MCV 98.5 78.0 - 100.0 fL   MCH 33.8 26.0 - 34.0 pg   MCHC 34.4 30.0 - 36.0 g/dL   RDW 12.9 11.5 - 15.5 %   Platelets 186 150 - 400 K/uL   Neutrophils Relative % 72 43 - 77 %   Neutro Abs 4.3 1.7 - 7.7 K/uL   Lymphocytes Relative 16 12 - 46 %   Lymphs Abs 0.9 0.7 - 4.0 K/uL   Monocytes Relative 9 3 - 12 %   Monocytes Absolute 0.5 0.1 - 1.0 K/uL   Eosinophils Relative 2 0 - 5 %   Eosinophils Absolute 0.1 0.0 - 0.7 K/uL   Basophils Relative 1 0 - 1 %   Basophils Absolute 0.0 0.0 - 0.1 K/uL  I-stat Chem 8, ED     Status: Abnormal   Collection Time: 04/10/15 10:53 AM  Result Value Ref Range   Sodium 133 (L) 135 - 145 mmol/L   Potassium 4.4 3.5 - 5.1 mmol/L   Chloride 99 (L) 101 - 111 mmol/L   BUN 11 6 - 20 mg/dL   Creatinine, Ser 0.80 0.61 - 1.24 mg/dL   Glucose, Bld 119 (H) 65 - 99 mg/dL   Calcium, Ion 1.17 1.13 - 1.30 mmol/L   TCO2 23 0 - 100 mmol/L   Hemoglobin 13.6 13.0 - 17.0 g/dL   HCT 40.0 39.0 - 52.0 %  I-stat troponin, ED     Status: None   Collection Time: 04/10/15 10:56 AM  Result Value Ref Range   Troponin i, poc 0.00 0.00 - 0.08 ng/mL   Comment 3            Comment: Due to the release kinetics of cTnI, a negative result within the first hours of the onset of symptoms does not rule out myocardial infarction with certainty. If myocardial infarction is still suspected, repeat the test at appropriate intervals.    Dg Chest Portable 1 View  04/10/2015   CLINICAL DATA:  Chest pain.  EXAM: PORTABLE CHEST - 1 VIEW  COMPARISON:  01/23/2015  FINDINGS: Sequelae of prior CABG are again identified. Cardiac silhouette is upper limits of normal in size. Thoracic aortic calcification is again seen. Chronic coarsening of the interstitial markings is again noted diffusely and is similar to the prior study. Mild scarring is noted in the lung bases. No confluent airspace opacity,  overt edema, pleural effusion, or pneumothorax is identified. No acute osseous abnormality is seen.  IMPRESSION: No active disease.   Electronically Signed   By: Logan Bores   On: 04/10/2015 11:00    ROS: General:mild cold with post nasal drip or fevers, no weight changes Skin:no rashes or ulcers HEENT:no blurred vision, no congestion CV:see HPI PUL:see HPI GI:no diarrhea constipation or melena, no indigestion GU:no hematuria, no dysuria MS:no joint pain, no claudication Neuro:no syncope, no lightheadedness Endo:no diabetes, no thyroid disease   Blood pressure 142/79, pulse 97, temperature 98.2 F (36.8 C), temperature source Oral, resp. rate 21, height 5\' 10"  (1.778 m), weight 169 lb (76.658 kg), SpO2 94 %. PE: General:Pleasant affect, NAD Skin:Warm and dry, brisk capillary refill HEENT:normocephalic,  sclera clear, mucus membranes moist Neck:supple, no JVD, no bruits  Heart:irreg a flutter  without murmur, gallup, rub or click Lungs:clear without rales, rhonchi, or wheezes WNU:UVOZ, non tender, + BS, do not palpate liver spleen or masses Ext:no lower ext edema, 2+ pedal pulses, 2+ radial pulses Neuro:alert and oriented X 3, MAE, follows commands, + facial symmetry    Assessment/Plan Principal Problem:   Unstable angina- on IV NTG but not sure how much angina due to a flutter with RVR, will do serial troponins, first in ED is negative.  Also with Heparin. Admit.     Active Problems:   Atrial flutter, RVR- on low dose troponin, 12.5 with hx of junctional rhythm in 2013 on higher dose of toprol.  Now on 12.5 daily. EP to see in AM for possible pacer.   Now pt back in SR.  Will keep NPO after MN.    Dyslipidemia (high LDL; low HDL)- on statin.    LBBB (left bundle branch block)   Hx of CABG and re-do CABG    CAD (coronary artery disease), previous stents to [facility] Ade R Nurse Practitioner Certified [place] Pager [phone] or after 5pm or  weekends call [phone] 04/10/2015, 3:17 PM  Agree with assessment and plan as noted above by Cecilie Kicks NP-C.  Patient is well known to me.  He has known extensive ischemic heart disease with CABG twice and with multiple subsequent PCIs.  Recently he has noted that his pulse has been more erratic. Sometimes his BP machine records pulse of 35.  At other times his pulse is about 100 which is high for him.  He has exertional chest tightness which is chronic. SL NTG helps. Use of higher doses of BB has been limited by junctional bradycardia. He has chronic LBBB. Today on arrival he was in atrial flutter. While I was talking with him he converted to NSR with sinus pauses. He states "I need a pacemaker".  We will admit to stepdown and cycle enzymes and ask EP to see him in am to consider options. Possible PPM.

## 2015-04-10 NOTE — Consult Note (Signed)
ELECTROPHYSIOLOGY CONSULT NOTE    Patient ID: [name] MRN: [id number], DOB/AGE: 1925/03/29 81 y.o.  Admit date: 04/10/2015 Date of Consult: 04/10/2015  Primary Physician: Paula Compton, MD Primary Cardiologist: Mare Ferrari  Reason for Consultation: atrial flutter and bradycardia  HPI:  [name] is a 81 y.o. male with a past medical history significant for CAD, ventricular tachycardia in the setting of STEMI, prior syncope, hypertension, and frequent falls, prior junctional rhythm with higher doses of beta blockers.  He is followed closely by Dr Mare Ferrari.  He has noticed at home recently his heart rates have been very erratic with rates in the 110's and 30's.  Today, he developed chest pain with radiation to the left arm and presented to the ER for further evaluation.  He does not have a prior history of atrial arrhythmias.   Echo 08/2014 demonstrated EF 97-35%, grade 1 diastolic dysfunction, no RWMA, LA 49.  Myoview 08/2014 demonstrated EF 58%, large infarct involving lateral wall of LV, no ischemia.  He wore an event monitor in January of this year for an episode of syncope that demonstrated sinus bradycardia with PAC's.  He was placed on NTG and Heparin in the ER for chest pain. Troponin's have remained negative so far.  EP has been asked to evaluate for treatment options with arrhythmias.   He currently denies chest pain, shortness of breath, recent fevers, chills, nausea or vomiting.  He reports exercise intolerance and fatigue that has been worsening over time.    Past Medical History  Diagnosis Date  . Systolic CHF        . Dyslipidemia (high LDL; low HDL)        . Peripheral vascular disease     thrombus- in leg- many yrs. ago- ?R- coumadin x1 mth  . Peripheral neuropathy   . Hiatal hernia   . Depression        . VT (ventricular tachycardia) 06/2012    a. in the setting of STEMI b. on amio during hospitalization  . Prostatitis   . Hypertension        .  Coronary artery disease     a. s/p CABG 1986 b. redo CABG 1993 c. PTCA 2008 d. DES 2010  . Insomnia        . GERD (gastroesophageal reflux disease)        . Peripheral neuropathy        . Diverticulosis   . Kidney stones   . Complication of anesthesia     B/P dropped low and had to stay in recovery longer  . Hyperlipemia   . Asthma        . Pneumonia 1960's  . On home oxygen therapy     "2L; just at night" (01/31/2015)  . Anemia 2014    was on iron pills and then was able to come off  . Hepatitis     hepatitis- long time ago, occupational contamination   . Arthritis     "all over"  . Prostate cancer     radiation therapy- 2002  . Atrial flutter     a. diagnosed 03/2015     Surgical History:  Past Surgical History  Procedure Laterality Date  . Cataract extraction w/ intraocular lens  implant, bilateral    . Fracture surgery      heel- crushed -2002,  (hardware)   . Joint replacement      R great toe  . Rhinoplasty  [id number]  . Lumbar laminectomy/decompression  microdiscectomy  08/10/2011    Procedure: LUMBAR LAMINECTOMY/DECOMPRESSION MICRODISCECTOMY;  Surgeon: Cooper Render Pool;  Location: [place] [facility];  Service: Neurosurgery;  Laterality: Right;  RIGHT Lumbar five-sacral one LAMINECTOMY, MICRODISCECTOMY  . Lumbar wound debridement  08/22/2011    Procedure: LUMBAR WOUND DEBRIDEMENT;  Surgeon: Eustace Moore;  Location: [place] [facility];  Service: Neurosurgery;  Laterality: N/A;  Repair of CSF Leak requiring laminectomy  . Cystoscopy w/ ureteral stent placement Right 02/28/2013    Procedure: CYSTOSCOPY WITH RETROGRADE PYELOGRAM/URETERAL STENT PLACEMENT;  Surgeon: Dutch Gray, MD;  Location: [facility];  Service: Urology;  Laterality: Right;  . Tonsillectomy  1940  . Appendectomy  1943  . Hammer toe surgery Right     little toe  . Transurethral resection of prostate  x2  . Foot fusion Left 2000    heel  . Hardware removal Right 2002    great toe; Dr. Sharol Given  . Cystoscopy with  ureteroscopy and stent placement Right 03/23/2013    Procedure: RIGHT URETEROSCOPY, LASER LITHO AND STENT PLACEMENT;  Surgeon: Fredricka Bonine, MD;  Location: [facility];  Service: Urology;  Laterality: Right;  . Holmium laser application Right 8/36/6294    Procedure: HOLMIUM LASER APPLICATION;  Surgeon: Fredricka Bonine, MD;  Location: [facility];  Service: Urology;  Laterality: Right;  . Intramedullary (im) nail intertrochanteric Right 08/29/2013    Procedure: INTRAMEDULLARY (IM) NAIL INTERTROCHANTRIC;  Surgeon: Marianna Payment, MD;  Location: [facility];  Service: Orthopedics;  Laterality: Right;  . Left and right heart catheterization with coronary angiogram N/A 07/15/2012    Procedure: LEFT AND RIGHT HEART CATHETERIZATION WITH CORONARY ANGIOGRAM;  Surgeon: Jettie Booze, MD;  Location: [facility] C[facility] LAB;  Service: Cardiovascular;  Laterality: N/A;  . Percutaneous coronary stent intervention (pci-s) N/A 07/15/2012    Procedure: PERCUTANEOUS CORONARY STENT INTERVENTION (PCI-S);  Surgeon: Jettie Booze, MD;  Location: [facility] CATH LAB;  Service: Cardiovascular;  Laterality: N/A;  . Coronary artery bypass graft  1986;  1993    CABG X 5; CABG X 1  . Colonoscopy    . Knee arthroscopy Right X 2  . Inguinal hernia repair Left 01/31/2015    Procedure: OPEN REPAIR RECURRENT LEFT INGUINAL HERNIA;  Surgeon: Fanny Skates, MD;  Location: [facility];  Service: General;  Laterality: Left;  . Insertion of mesh Left 01/31/2015    Procedure: INSERTION OF MESH;  Surgeon: Fanny Skates, MD;  Location: [facility];  Service: General;  Laterality: Left;      Current facility-administered medications:  .  heparin ADULT infusion 100 units/mL (25000 units/250 mL), 900 Units/hr, Intravenous, Continuous, Cecilio Asper Batchelder, RPH, Last Rate: 9 mL/hr at 04/10/15 1037, 900 Units/hr at 04/10/15 1037  Current outpatient prescriptions:  .  acetaminophen (TYLENOL) 500 MG tablet, Take 500 mg by mouth at bedtime. Take at bedtime  every night per patient, Disp: , Rfl:  .  atorvastatin (LIPITOR) 40 MG tablet, Take 40 mg by mouth daily at 6 PM., Disp: , Rfl:  .  budesonide-formoterol (SYMBICORT) 80-4.5 MCG/ACT inhaler, Inhale 2 puffs into the lungs 2 (two) times daily., Disp: , Rfl:  .  calcium-vitamin D (OSCAL WITH D) 500-200 MG-UNIT per tablet, Take 2 tablets by mouth daily., Disp: , Rfl:  .  carbamazepine (TEGRETOL) 100 MG chewable tablet, Chew 300 mg by mouth 2 (two) times daily., Disp: , Rfl:  .  docusate sodium (COLACE) 100 MG capsule, Take 100 mg by mouth daily., Disp: , Rfl:  .  finasteride (PROSCAR)  5 MG tablet, Take 5 mg by mouth daily. , Disp: , Rfl:  .  furosemide (LASIX) 40 MG tablet, Take 40 mg by mouth every morning., Disp: , Rfl:  .  ipratropium (ATROVENT HFA) 17 MCG/ACT inhaler, Inhale 2 puffs into the lungs 2 (two) times daily as needed for wheezing. , Disp: , Rfl:  .  isosorbide mononitrate (IMDUR) 60 MG 24 hr tablet, Take 90 mg by mouth daily. , Disp: , Rfl:  .  metoprolol succinate (TOPROL-XL) 12.5 mg TB24 24 hr tablet, Take 12.5 mg by mouth daily., Disp: , Rfl:  .  Multiple Vitamins-Minerals (MULTIVITAMIN WITH MINERALS) tablet, Take 1 tablet by mouth daily., Disp: , Rfl:  .  NITROSTAT 0.4 MG SL tablet, PLACE 1 TABLET UNDER THE TONGUE EVERY 5 MINUTES ASDIRECTED BY PHYSICIAN, Disp: 25 tablet, Rfl: 3 .  potassium chloride SA (K-DUR,KLOR-CON) 20 MEQ tablet, Take 20 mEq by mouth daily. , Disp: , Rfl:  .  ranitidine (ZANTAC) 150 MG tablet, Take 1 tablet (150 mg total) by mouth 2 (two) times daily., Disp: 180 tablet, Rfl: 3 .  senna (SENOKOT) 8.6 MG tablet, Take 1 tablet by mouth daily as needed for constipation. , Disp: , Rfl:  .  sertraline (ZOLOFT) 50 MG tablet, Take 150 mg by mouth daily., Disp: , Rfl:  .  tamsulosin (FLOMAX) 0.4 MG CAPS capsule, Take 1 capsule (0.4 mg total) by mouth daily., Disp: 30 capsule, Rfl: 0 .  temazepam (RESTORIL) 15 MG capsule, Take one capsule by mouth every night at bedtime as  needed for sleep, Disp: 30 capsule, Rfl: 5 .  HYDROcodone-acetaminophen (NORCO/VICODIN) 5-325 MG per tablet, Take 1-2 tablets by mouth every 4 (four) hours as needed for moderate pain. (Patient not taking: Reported on 04/10/2015), Disp: 30 tablet, Rfl: 0  Allergies: No Known Allergies  Social History   Social History  . Marital Status: Married    Spouse Name: N/A  . Number of Children: N/A  . Years of Education: N/A   Occupational History  . Not on file.   Social History Main Topics  . Smoking status: Never Smoker   . Smokeless tobacco: Never Used  . Alcohol Use: No  . Drug Use: No  . Sexual Activity: Not on file   Other Topics Concern  . Not on file   Social History Narrative     Family History  Problem Relation Age of Onset  . Heart attack Mother   . Addison's disease Father   . Heart attack Sister   . Anesthesia problems Neg Hx   . Hypotension Neg Hx   . Malignant hyperthermia Neg Hx   . Pseudochol deficiency Neg Hx      Review of Systems: All other systems reviewed and are otherwise negative except as noted above.  Physical Exam: Filed Vitals:   04/10/15 1400 04/10/15 1415 04/10/15 1545 04/10/15 1600  BP: 149/74 142/79 135/71 132/72  Pulse: 102 97 84 76  Temp:      TempSrc:      Resp: 14 21 23 16   Height:      Weight:      SpO2: 94% 94% 92% 91%    GEN- The patient is elderly appearing, alert and oriented x 3 today.   HEENT: normocephalic, atraumatic; sclera clear, conjunctiva pink; hearing intact; oropharynx clear; neck supple  Lungs- Clear to ausculation bilaterally, normal work of breathing.  No wheezes, rales, rhonchi Heart- Regular rate and rhythm, no murmurs, rubs or gallops  GI- soft, non-tender,  non-distended, bowel sounds present  Extremities- no clubbing, cyanosis, or edema; DP/PT/radial pulses 2+ bilaterally MS- no significant deformity or atrophy Skin- warm and dry, no rash or lesion Psych- euthymic mood, full affect Neuro- strength and  sensation are intact  Labs:   Lab Results  Component Value Date   WBC 5.9 04/10/2015   HGB 13.6 04/10/2015   HCT 40.0 04/10/2015   MCV 98.5 04/10/2015   PLT 186 04/10/2015     Recent Labs Lab 04/10/15 1053  NA 133*  K 4.4  CL 99*  BUN 11  CREATININE 0.80  GLUCOSE 119*      Radiology/Studies: Dg Chest Portable 1 View 04/10/2015   CLINICAL DATA:  Chest pain.  EXAM: PORTABLE CHEST - 1 VIEW  COMPARISON:  01/23/2015  FINDINGS: Sequelae of prior CABG are again identified. Cardiac silhouette is upper limits of normal in size. Thoracic aortic calcification is again seen. Chronic coarsening of the interstitial markings is again noted diffusely and is similar to the prior study. Mild scarring is noted in the lung bases. No confluent airspace opacity, overt edema, pleural effusion, or pneumothorax is identified. No acute osseous abnormality is seen.  IMPRESSION: No active disease.   Electronically Signed   By: Logan Bores   On: 04/10/2015 11:00    EKG: atrial flutter with 2:1 conduction, LBBB  TELEMETRY: sinus bradycardia  Assessment/Plan: 1.  Tachy/brady syndrome The patient has tachy/brady syndrome that is symptomatic.  He has been unable to tolerate increased doses of beta blockers in the past due to symptomatic bradycardia.  He has also had prior syncope that is concerning for bradycardia with known conduction system disease.  Risks, benefits of PPM implantation discussed with the patient and his wife by Dr Curt Bears today who wish to proceed.   2.  Atrial flutter The patient has newly diagnosed atrial flutter this admission with a CHADS2VASC of at least 4 Pax Reasoner frequent falls, Janeya Deyo defer decision about long term anticoagulation to Dr Mare Ferrari who knows him well Asim Gersten rate control with increased dose of Toprol after PPM placed Rayn Shorb monitor atrial arrhythmia burden through device   3.  CAD s/p CABG Chest pain likely demand ischemia in the setting of atrial flutter with RVR Troponins  negative No plans for further ischemic evaluation at this time   Signed, Chanetta Marshall, NP 04/10/2015 4:34 PM   I have seen and examined this patient with Chanetta Marshall.  Agree with above, note added to reflect my findings.  On exam, regular rhythm, 2/6 SEM at RUSB, no wheezing.  Presented with chest pain found to be in possibly atrial flutter. Converted in hospital without intervention.  Also has history of bradycardia with beta blockers.  Rocky Gladden plan for dual chamber pacemaker for tachy-brady syndrome which Hektor Huston allow for therapy with beta blockers for tachycardia.    Rodina Pinales M. Breland Elders MD 04/11/2015 10:34 AM

## 2015-04-10 NOTE — Progress Notes (Signed)
ANTICOAGULATION CONSULT NOTE - Initial Consult  Pharmacy Consult for heparin Indication: ACS / STEMI  No Known Allergies  Patient Measurements: Height: 5\' 10"  (177.8 cm) Weight: 169 lb (76.658 kg) IBW/kg (Calculated) : 73  Vital Signs: Temp: 98.2 F (36.8 C) (08/11 0954) Temp Source: Oral (08/11 0954) BP: 140/84 mmHg (08/11 0950) Pulse Rate: 101 (08/11 0950)  Labs: No results for input(s): HGB, HCT, PLT, APTT, LABPROT, INR, HEPARINUNFRC, CREATININE, CKTOTAL, CKMB, TROPONINI in the last 72 hours.  CrCl cannot be calculated (Patient has no serum creatinine result on file.).   Medical History: Past Medical History  Diagnosis Date  . Systolic CHF     takes Furosemide daily  . Dyslipidemia (high LDL; low HDL)     takes Atorvastatin daily  . Heart murmur   . Peripheral vascular disease     thrombus- in leg- many yrs. ago- ?R- coumadin x1 mth  . Peripheral neuropathy   . Hiatal hernia   . Depression     uses Zoloft   . VT (ventricular tachycardia) 06/2012    in the setting of STEMI  . Prostatitis   . Hypertension     takes Imdur and Metroprolol daily  . Coronary artery disease     takes Plavix daily   . Constipation     takes Colace daily  . Insomnia     takes Restoril nightly as needed  . Enlarged prostate     takes Flomax daily and Sennokot daily as needed  . GERD (gastroesophageal reflux disease)     takes Zantac daily  . Myocardial infarction     several with most recent one in 2013  . Anginal pain     last took Nitroglycerin 3wks ago-took 2 and relieved symtoms  . Cough     chest cold recent  . Peripheral neuropathy     takes tegretol daily  . Joint pain   . Joint swelling   . Diverticulosis   . Urinary frequency   . History of blood transfusion     "related to hip OR and bypass OR"  . Kidney stones   . Complication of anesthesia     B/P dropped low and had to stay in recovery longer  . Hyperlipemia   . CHF (congestive heart failure) 2010  .  Asthma     uses Symbicort daily and also has Albuterol prn  . Pneumonia 1960's  . On home oxygen therapy     "2L; just at night" (01/31/2015)  . Anemia 2014    was on iron pills and then was able to come off  . Hepatitis     hepatitis- long time ago, occupational contamination   . Headache     "weekly" (01/31/2015)  . Arthritis     "all over"  . Prostate cancer     radiation therapy- 2002    Assessment: 80 yo M presents on 8/11 with increased HR and dizziness leading to chest pain. Not on any anticoag at home. SCr back in June was stable, CrCl ~40ml/min. CBC pending. No s/s of bleed.   Goal of Therapy:  Heparin level 0.3-0.7 units/ml Monitor platelets by anticoagulation protocol: Yes   Plan:  Give 4,000 unit heparin BOLUS Start heparin gtt at 900 units/hr Check 8 hr HL Monitor daily HL, CBC, s/s of bleed  [name] J 04/10/2015,10:21 AM

## 2015-04-10 NOTE — ED Notes (Signed)
Admitting at bedside 

## 2015-04-10 NOTE — ED Notes (Signed)
Pt arrives from home via [facility]. Pt states he was having CP 8/10 that radiated to left arm. Pt had 5 nitro and 324 ASA PTA and is now a 2/10. Pt states nitro helped relieve pain.

## 2015-04-10 NOTE — Progress Notes (Signed)
ANTICOAGULATION CONSULT NOTE - Follow Up Consult  Pharmacy Consult for Heparin Indication: chest pain/ACS and atrial fibrillation  No Known Allergies  Patient Measurements: Height: 5\' 10"  (177.8 cm) Weight: 153 lb (69.4 kg) IBW/kg (Calculated) : 73 Heparin Dosing Weight: 69.4 kg  Vital Signs: Temp: 98 F (36.7 C) (08/11 1905) Temp Source: Oral (08/11 1905) BP: 167/72 mmHg (08/11 2100) Pulse Rate: 65 (08/11 2100)  Labs:  Recent Labs  04/10/15 1032 04/10/15 1053 04/10/15 2027  HGB 13.3 13.6  --   HCT 38.7* 40.0  --   PLT 186  --   --   HEPARINUNFRC  --   --  0.34  CREATININE  --  0.80  --     Estimated Creatinine Clearance: 62.7 mL/min (by C-G formula based on Cr of 0.8).  Assessment:   Initial heparin level is low therapeutic (0.34) on 900 units/hr.   Noted back in NSR.  First troponin negative.  Goal of Therapy:  Heparin level 0.3-0.7 units/ml Monitor platelets by anticoagulation protocol: Yes   Plan:   Increase heparin drip to 1000 units/hr, to try to keep level at goal.  Daily heparin level and CBC.  Arty Baumgartner, Jud Pager: [phone] 04/10/2015,9:31 PM

## 2015-04-10 NOTE — Telephone Encounter (Signed)
Patient states yesterday he developed elevated HR in 80-90's regular rhythm, however he usually runs bradycardic. He experienced dizziness with this faster heart rate throughout the day. Denied other sx. He stated that he just thought he forgot to take his morning meds. He woke up this morning with his heart rate at 101 (still regular rhythm), dizziness and now is experiencing CP (mid-sternum, sharp, stabbing). He took NTG which provided only temporary relief. He has now taken a Tums. Information reviewed by Dr. Meda Coffee. Patient advised to call 911 and proceed to the ED. Patient advised not to drive. Patient agrees with plan. Notified Trish at [facility] that patient was en route to hospital via EMS. Routed to Dr. Mare Ferrari as fyi.

## 2015-04-10 NOTE — Progress Notes (Signed)
ANTICOAGULATION CONSULT NOTE -f/u Consult  Pharmacy Consult for heparin Indication: ACS / STEMI, afib/flutter  No Known Allergies  Patient Measurements: Height: 5\' 10"  (177.8 cm) Weight: 153 lb (69.4 kg) IBW/kg (Calculated) : 73  Vital Signs: Temp: 98 F (36.7 C) (08/11 1905) Temp Source: Oral (08/11 1905) BP: 152/65 mmHg (08/11 1905) Pulse Rate: 64 (08/11 1905)  Labs:  Recent Labs  04/10/15 1032 04/10/15 1053 04/10/15 2027  HGB 13.3 13.6  --   HCT 38.7* 40.0  --   PLT 186  --   --   HEPARINUNFRC  --   --  0.34  CREATININE  --  0.80  --     Estimated Creatinine Clearance: 62.7 mL/min (by C-G formula based on Cr of 0.8).   Medical History: Past Medical History  Diagnosis Date  . Systolic CHF        . Dyslipidemia (high LDL; low HDL)        . Peripheral vascular disease     thrombus- in leg- many yrs. ago- ?R- coumadin x1 mth  . Peripheral neuropathy   . Hiatal hernia   . Depression        . VT (ventricular tachycardia) 06/2012    a. in the setting of STEMI b. on amio during hospitalization  . Prostatitis   . Hypertension        . Coronary artery disease     a. s/p CABG 1986 b. redo CABG 1993 c. PTCA 2008 d. DES 2010  . Insomnia        . GERD (gastroesophageal reflux disease)        . Peripheral neuropathy        . Diverticulosis   . Kidney stones   . Complication of anesthesia     B/P dropped low and had to stay in recovery longer  . Hyperlipemia   . Asthma        . Pneumonia 1960's  . On home oxygen therapy     "2L; just at night" (01/31/2015)  . Anemia 2014    was on iron pills and then was able to come off  . Hepatitis     hepatitis- long time ago, occupational contamination   . Arthritis     "all over"  . Prostate cancer     radiation therapy- 2002  . Atrial flutter     a. diagnosed 03/2015    Assessment: 81 yo M presents on 8/11 with increased HR and dizziness leading to chest pain. Patient has [name] extensive h/o cardiac disease.  Not on any anticoag at home. Baseline Hgb 13.3, Plts 186.    Anticoag: Heparin for afib/flutter. Initial HL 0.34 in goal.  Goal of Therapy:  Heparin level 0.3-0.7 units/ml Monitor platelets by anticoagulation protocol: Yes   Plan:  Continue IV heparin at 900 units/hr Daily HL and CBC   [name] [name], PharmD, BCPS Clinical Staff Pharmacist Pager [phone]  [name] 04/10/2015,9:22 PM

## 2015-04-10 NOTE — Telephone Encounter (Signed)
New message     Pulse is running around 101 right now and usually runs 50-60 Pt states when he got up this morning it was 88 Please call to discuss

## 2015-04-11 ENCOUNTER — Encounter (HOSPITAL_COMMUNITY)
Admission: EM | Disposition: A | Discharge status: Home / Self Care | Payer: Self-pay | Source: Home / Self Care | Attending: Cardiology

## 2015-04-11 DIAGNOSIS — I495 Sick sinus syndrome: Secondary | ICD-10-CM

## 2015-04-11 HISTORY — PX: EP IMPLANTABLE DEVICE: SHX172B

## 2015-04-11 LAB — BASIC METABOLIC PANEL
Anion gap: 7 (ref 5–15)
BUN: 9 mg/dL (ref 6–20)
CALCIUM: 9.3 mg/dL (ref 8.9–10.3)
CHLORIDE: 99 mmol/L — AB (ref 101–111)
CO2: 28 mmol/L (ref 22–32)
CREATININE: 0.89 mg/dL (ref 0.61–1.24)
GFR calc Af Amer: >60 mL/min (ref >60)
GFR calc non Af Amer: >60 mL/min (ref >60)
GLUCOSE: 98 mg/dL (ref 65–99)
Potassium: 4.4 mmol/L (ref 3.5–5.1)
Sodium: 134 mmol/L — ABNORMAL LOW (ref 135–145)

## 2015-04-11 LAB — TROPONIN I: Troponin I: <0.03 ng/mL (ref <0.031)

## 2015-04-11 LAB — LIPID PANEL
CHOL/HDL RATIO: 2.6 ratio
Cholesterol: 179 mg/dL (ref 0–200)
HDL: 68 mg/dL (ref >40)
LDL CALC: 103 mg/dL — AB (ref 0–99)
Triglycerides: 38 mg/dL (ref <150)
VLDL: 8 mg/dL (ref 0–40)

## 2015-04-11 LAB — CBC
HCT: 38 % — ABNORMAL LOW (ref 39.0–52.0)
HEMOGLOBIN: 12.8 g/dL — AB (ref 13.0–17.0)
MCH: 33.4 pg (ref 26.0–34.0)
MCHC: 33.7 g/dL (ref 30.0–36.0)
MCV: 99.2 fL (ref 78.0–100.0)
Platelets: 194 10*3/uL (ref 150–400)
RBC: 3.83 MIL/uL — ABNORMAL LOW (ref 4.22–5.81)
RDW: 13.2 % (ref 11.5–15.5)
WBC: 5.8 10*3/uL (ref 4.0–10.5)

## 2015-04-11 LAB — PROTIME-INR
INR: 1.16 (ref 0.00–1.49)
PROTHROMBIN TIME: 15 s (ref 11.6–15.2)

## 2015-04-11 LAB — TSH: TSH: 4.902 u[IU]/mL — ABNORMAL HIGH (ref 0.350–4.500)

## 2015-04-11 LAB — HEPARIN LEVEL (UNFRACTIONATED): HEPARIN UNFRACTIONATED: 0.45 [IU]/mL (ref 0.30–0.70)

## 2015-04-11 SURGERY — PACEMAKER IMPLANT
Anesthesia: LOCAL

## 2015-04-11 MED ORDER — SODIUM CHLORIDE 0.9 % IR SOLN
Status: DC | PRN
  Administered 2015-04-11: 16:00:00

## 2015-04-11 MED ORDER — LIDOCAINE HCL (PF) 1 % IJ SOLN
INTRAMUSCULAR | Status: AC
  Filled 2015-04-11: qty 60

## 2015-04-11 MED ORDER — LIDOCAINE HCL (PF) 1 % IJ SOLN
INTRAMUSCULAR | Status: DC | PRN
  Administered 2015-04-11: 15:00:00

## 2015-04-11 MED ORDER — ACETAMINOPHEN 325 MG PO TABS: 325.0000 mg | ORAL_TABLET | ORAL | Status: DC | PRN

## 2015-04-11 MED ORDER — LIDOCAINE HCL (PF) 1 % IJ SOLN: INTRAMUSCULAR | Status: DC | PRN

## 2015-04-11 MED ORDER — FENTANYL CITRATE (PF) 100 MCG/2ML IJ SOLN: 25.0000 ug | INTRAMUSCULAR | Status: DC | PRN

## 2015-04-11 MED ORDER — CHLORHEXIDINE GLUCONATE 4 % EX LIQD
60.0000 mL | Freq: Once | CUTANEOUS | Status: AC
  Administered 2015-04-11: 4 via TOPICAL

## 2015-04-11 MED ORDER — CHLORHEXIDINE GLUCONATE 4 % EX LIQD
CUTANEOUS | Status: AC
  Filled 2015-04-11: qty 15

## 2015-04-11 MED ORDER — CEFAZOLIN SODIUM-DEXTROSE 2-3 GM-% IV SOLR
2.0000 g | INTRAVENOUS | Status: DC
  Filled 2015-04-11: qty 50

## 2015-04-11 MED ORDER — CEFAZOLIN SODIUM-DEXTROSE 2-3 GM-% IV SOLR
INTRAVENOUS | Status: DC | PRN
Start: 2015-04-11 — End: 2015-04-11
  Administered 2015-04-11: 2 g via INTRAVENOUS

## 2015-04-11 MED ORDER — SODIUM CHLORIDE 0.9 % IV SOLN: INTRAVENOUS | Status: DC

## 2015-04-11 MED ORDER — ONDANSETRON HCL 4 MG/2ML IJ SOLN: 4.0000 mg | Freq: Four times a day (QID) | INTRAMUSCULAR | Status: DC | PRN

## 2015-04-11 MED ORDER — SODIUM CHLORIDE 0.9 % IR SOLN
Status: AC
  Filled 2015-04-11: qty 2

## 2015-04-11 MED ORDER — MIDAZOLAM HCL 5 MG/5ML IJ SOLN
INTRAMUSCULAR | Status: AC
  Filled 2015-04-11: qty 25

## 2015-04-11 MED ORDER — CEFAZOLIN SODIUM-DEXTROSE 2-3 GM-% IV SOLR
INTRAVENOUS | Status: AC
  Filled 2015-04-11: qty 50

## 2015-04-11 MED ORDER — CEFAZOLIN SODIUM 1-5 GM-% IV SOLN
1.0000 g | Freq: Four times a day (QID) | INTRAVENOUS | Status: AC
  Administered 2015-04-11 – 2015-04-12 (×3): 1 g via INTRAVENOUS
  Filled 2015-04-11 (×4): qty 50

## 2015-04-11 MED ORDER — MIDAZOLAM HCL 5 MG/5ML IJ SOLN
INTRAMUSCULAR | Status: DC | PRN
  Administered 2015-04-11: 1 mg via INTRAVENOUS

## 2015-04-11 MED ORDER — FENTANYL CITRATE (PF) 100 MCG/2ML IJ SOLN
INTRAMUSCULAR | Status: AC
  Filled 2015-04-11: qty 4

## 2015-04-11 MED ORDER — SODIUM CHLORIDE 0.9 % IR SOLN
80.0000 mg | Status: DC
  Filled 2015-04-11: qty 2

## 2015-04-11 SURGICAL SUPPLY — 8 items
CABLE SURGICAL S-101-97-12 (CABLE) ×1 IMPLANT
LEAD CAPSURE NOVUS 5076-52CM (Lead) ×1 IMPLANT
LEAD ISOFLEX OPT 1948-58CM (Lead) ×1 IMPLANT
PACEMAKER ADAPTA DR ADDRL1 (Pacemaker) IMPLANT
PAD DEFIB LIFELINK (PAD) ×1 IMPLANT
PPM ADAPTA DR ADDRL1 (Pacemaker) ×2 IMPLANT
SHEATH CLASSIC 7F 25CM (SHEATH) ×2 IMPLANT
TRAY PACEMAKER INSERTION (CUSTOM PROCEDURE TRAY) ×1 IMPLANT

## 2015-04-11 NOTE — Progress Notes (Signed)
Patient Name: [name] Date of Encounter: 04/11/2015     Principal Problem:   Unstable angina Active Problems:   Dyslipidemia (high LDL; low HDL)   LBBB (left bundle branch block)   Hx of CABG and re-do CABG    Atrial flutter, RVR   CAD (coronary artery disease), previous stents to mLAD,     SUBJECTIVE  Patient has done well overnight.  No further angina. Troponins are negative. Rhythm NSR this am.  CURRENT MEDS . acetaminophen  500 mg Oral QHS  . aspirin EC  81 mg Oral Daily  . atorvastatin  40 mg Oral q1800  . budesonide-formoterol  2 puff Inhalation BID  . calcium-vitamin D  2 tablet Oral Daily  . carbamazepine  300 mg Oral BID  . docusate sodium  100 mg Oral Daily  . famotidine  20 mg Oral BID  . finasteride  5 mg Oral Daily  . furosemide  40 mg Oral q morning - 10a  . metoprolol succinate  12.5 mg Oral Daily  . multivitamin with minerals  1 tablet Oral Daily  . potassium chloride SA  20 mEq Oral Daily  . sertraline  150 mg Oral Daily  . tamsulosin  0.4 mg Oral Daily    OBJECTIVE  Filed Vitals:   04/11/15 0600 04/11/15 0700 04/11/15 0715 04/11/15 0801  BP: 147/55 157/56    Pulse: 55 54    Temp:    97.4 F (36.3 C)  TempSrc:    Oral  Resp: 11 15    Height:      Weight:      SpO2: 96% 97% 97%     Intake/Output Summary (Last 24 hours) at 04/11/15 0819 Last data filed at 04/11/15 0600  Gross per 24 hour  Intake  132.3 ml  Output    300 ml  Net -167.7 ml   Filed Weights   04/10/15 0954 04/10/15 1905  Weight: 169 lb (76.658 kg) 153 lb (69.4 kg)    PHYSICAL EXAM  General: Pleasant, NAD. Neuro: Alert and oriented X 3. Moves all extremities spontaneously. Psych: Normal affect. HEENT:  Normal  Neck: Supple without bruits or JVD. Lungs:  Resp regular and unlabored, CTA. Heart: RRR no s3, s4, or murmurs. Abdomen: Soft, non-tender, non-distended, BS + x 4.  Extremities: No clubbing, cyanosis or edema. DP/PT/Radials 2+ and equal  bilaterally.  Accessory Clinical Findings  CBC  Recent Labs  04/10/15 1032 04/10/15 1053  WBC 5.9  --   NEUTROABS 4.3  --   HGB 13.3 13.6  HCT 38.7* 40.0  MCV 98.5  --   PLT 186  --    Basic Metabolic Panel  Recent Labs  04/10/15 1053 04/10/15 2027  NA 133*  --   K 4.4  --   CL 99*  --   GLUCOSE 119*  --   BUN 11  --   CREATININE 0.80  --   MG  --  2.0   Liver Function Tests No results for input(s): AST, ALT, ALKPHOS, BILITOT, PROT, ALBUMIN in the last 72 hours. No results for input(s): LIPASE, AMYLASE in the last 72 hours. Cardiac Enzymes  Recent Labs  04/10/15 2027 04/11/15 0049  TROPONINI 0.03 <0.03   BNP Invalid input(s): POCBNP D-Dimer No results for input(s): DDIMER in the last 72 hours. Hemoglobin A1C No results for input(s): HGBA1C in the last 72 hours. Fasting Lipid Panel  Recent Labs  04/11/15 0227  CHOL 179  HDL 68  LDLCALC  103*  TRIG 38  CHOLHDL 2.6   Thyroid Function Tests  Recent Labs  04/10/15 2027  TSH 4.902*    TELE  NSR   ECG    Radiology/Studies  Dg Chest Portable 1 View  04/10/2015   CLINICAL DATA:  Chest pain.  EXAM: PORTABLE CHEST - 1 VIEW  COMPARISON:  01/23/2015  FINDINGS: Sequelae of prior CABG are again identified. Cardiac silhouette is upper limits of normal in size. Thoracic aortic calcification is again seen. Chronic coarsening of the interstitial markings is again noted diffusely and is similar to the prior study. Mild scarring is noted in the lung bases. No confluent airspace opacity, overt edema, pleural effusion, or pneumothorax is identified. No acute osseous abnormality is seen.  IMPRESSION: No active disease.   Electronically Signed   By: Logan Bores   On: 04/10/2015 11:00    ASSESSMENT AND PLAN  Sick sinus syndrome with paroxysmal atrial flutter. Ischemic heart disease. LBBB  Plan: Appreciate EPs consult.  For PPM this afternoon.  Signed, Warren Danes MD

## 2015-04-11 NOTE — Discharge Instructions (Signed)
° ° °  Supplemental Discharge Instructions for  Pacemaker/Defibrillator Patients  Activity No heavy lifting or vigorous activity with your left/right arm for 6 to 8 weeks.  Do not raise your left/right arm above your head for one week.  Gradually raise your affected arm as drawn below.           __       04/15/15                    04/16/15                  04/17/15                    04/18/15  NO DRIVING for  1 week   ; you may begin driving on  4/31/54   .  WOUND CARE - Keep the wound area clean and dry.  Do not get this area wet for one week. No showers for one week; you may shower on  04/18/15   . - The tape/steri-strips on your wound will fall off; do not pull them off.  No bandage is needed on the site.  DO  NOT apply any creams, oils, or ointments to the wound area. - If you notice any drainage or discharge from the wound, any swelling or bruising at the site, or you develop a fever > 101? F after you are discharged home, call the office at once.  Special Instructions - You are still able to use cellular telephones; use the ear opposite the side where you have your pacemaker/defibrillator.  Avoid carrying your cellular phone near your device. - When traveling through airports, show security personnel your identification card to avoid being screened in the metal detectors.  Ask the security personnel to use the hand wand. - Avoid arc welding equipment, MRI testing (magnetic resonance imaging), TENS units (transcutaneous nerve stimulators).  Call the office for questions about other devices. - Avoid electrical appliances that are in poor condition or are not properly grounded. - Microwave ovens are safe to be near or to operate.

## 2015-04-11 NOTE — Progress Notes (Signed)
Utilization review completed. Christain Mcraney, RN, BSN. 

## 2015-04-12 ENCOUNTER — Inpatient Hospital Stay (HOSPITAL_COMMUNITY): Payer: Medicare Other

## 2015-04-12 DIAGNOSIS — E784 Other hyperlipidemia: Secondary | ICD-10-CM

## 2015-04-12 DIAGNOSIS — I2510 Atherosclerotic heart disease of native coronary artery without angina pectoris: Secondary | ICD-10-CM

## 2015-04-12 LAB — HEMOGLOBIN A1C
HEMOGLOBIN A1C: 6 % — AB (ref 4.8–5.6)
Mean Plasma Glucose: 126 mg/dL

## 2015-04-12 MED ORDER — CLOPIDOGREL BISULFATE 75 MG PO TABS
75.0000 mg | ORAL_TABLET | Freq: Every day | ORAL | Status: DC
  Administered 2015-04-12: 75 mg via ORAL
  Filled 2015-04-12: qty 1

## 2015-04-12 MED ORDER — ASPIRIN 81 MG PO TBEC: 81.0000 mg | DELAYED_RELEASE_TABLET | Freq: Every day | ORAL | Status: AC

## 2015-04-12 MED ORDER — CLOPIDOGREL BISULFATE 75 MG PO TABS: 75.0000 mg | ORAL_TABLET | Freq: Every day | ORAL | Status: DC

## 2015-04-12 NOTE — Discharge Summary (Signed)
Patient ID: [name],  MRN: [id number], DOB/AGE: 1925/08/02 81 y.o.  Admit date: 04/10/2015 Discharge date: 04/12/2015  Primary Care Provider: Paula Compton, MD Primary Cardiologist: Dr Mare Ferrari  Discharge Diagnoses Principal Problem:   Unstable angina Active Problems:   Dyslipidemia (high LDL; low HDL)   LBBB (left bundle branch block)   Hx of CABG and re-do CABG    Atrial flutter, RVR   CAD (coronary artery disease), previous stents to mLAD,     Procedures: MDT PTVDP 04/11/15   Hospital Course: 81 y/o male with a history of extensive ischemic heart disease. He had CABG in 1986. He had a redo CABG in 1993. He had PTCA with stent in October 2008. He had a DES in February 2010. He had a small non-Q MI in August 2010.He presented to the emergency room on 07/15/12 after falling at home. While in the emergency room he developed chest pain followed by ventricular tachycardia requiring cardioversion. He was taken emergently to the cath lab and treated with a BMS to the mid LAD. He was treated initially with amiodarone which was subsequently stopped prior to discharge secondary to side effects. His echocardiogram November 2013 showed ejection fraction 40-45% and moderate left atrial enlargement. He has chronic LBBB. In Jan 2016 he had an episode of syncope and chest pain. Myoview showed scar. He has had bradycardia documented in the past.  He presented to the ED 04/10/15 with tachycardia and was found to be in A flutter with variable VR. CHADs VASc =4. He underwent placement of a MDT PTVDP 05/22/29 without complications. He has a history of falls. He has drop foot on the right as well as a prior Rt hip replacement. His last fall was 4 weeks ago. He is not felt to be a candidate for anticoagulation. The pt does say he has been on Plavix for some time, this was not ordered on admission but will be resumed at discharge. He'll be discharged later this am pending his CXR.  Discharge  Vitals:  Blood pressure 120/82, pulse 73, temperature 97.6 F (36.4 C), temperature source Oral, resp. rate 19, height 5\' 10"  (1.778 m), weight 153 lb (69.4 kg), SpO2 93 %.    Labs: No results found for this or any previous visit (from the past 24 hour(s)).  Disposition:  Follow-up Information    Follow up with [facility] On 04/23/2015.   Why:  at [facility] for wound check   Contact information:   [address] [place] [address]       Follow up with Warren Danes, MD On 04/23/2015.   Specialty:  Cardiology   Why:  at 9:15AM   Contact information:   [address] [place] [address] [phone]       Discharge Medications:    Medication List    STOP taking these medications        HYDROcodone-acetaminophen 5-325 MG per tablet  Commonly known as:  NORCO/VICODIN      TAKE these medications        acetaminophen 500 MG tablet  Commonly known as:  TYLENOL  Take 500 mg by mouth at bedtime. Take at bedtime every night per patient     aspirin 81 MG EC tablet  Take 1 tablet (81 mg total) by mouth daily.     atorvastatin 40 MG tablet  Commonly known as:  LIPITOR  Take 40 mg by mouth daily at 6 PM.  budesonide-formoterol 80-4.5 MCG/ACT inhaler  Commonly known as:  SYMBICORT  Inhale 2 puffs into the lungs 2 (two) times daily.     calcium-vitamin D 500-200 MG-UNIT per tablet  Commonly known as:  OSCAL WITH D  Take 2 tablets by mouth daily.     carbamazepine 100 MG chewable tablet  Commonly known as:  TEGRETOL  Chew 300 mg by mouth 2 (two) times daily.     docusate sodium 100 MG capsule  Commonly known as:  COLACE  Take 100 mg by mouth daily.     finasteride 5 MG tablet  Commonly known as:  PROSCAR  Take 5 mg by mouth daily.     furosemide 40 MG tablet  Commonly known as:  LASIX  Take 40 mg by mouth every morning.     ipratropium 17 MCG/ACT inhaler  Commonly known as:  ATROVENT HFA  Inhale 2 puffs into  the lungs 2 (two) times daily as needed for wheezing.     isosorbide mononitrate 60 MG 24 hr tablet  Commonly known as:  IMDUR  Take 90 mg by mouth daily.     metoprolol succinate 12.5 mg Tb24 24 hr tablet  Commonly known as:  TOPROL-XL  Take 12.5 mg by mouth daily.     multivitamin with minerals tablet  Take 1 tablet by mouth daily.     NITROSTAT 0.4 MG SL tablet  Generic drug:  nitroGLYCERIN  PLACE 1 TABLET UNDER THE TONGUE EVERY 5 MINUTES ASDIRECTED BY PHYSICIAN     potassium chloride SA 20 MEQ tablet  Commonly known as:  K-DUR,KLOR-CON  Take 20 mEq by mouth daily.     ranitidine 150 MG tablet  Commonly known as:  ZANTAC  Take 1 tablet (150 mg total) by mouth 2 (two) times daily.     senna 8.6 MG tablet  Commonly known as:  SENOKOT  Take 1 tablet by mouth daily as needed for constipation.     sertraline 50 MG tablet  Commonly known as:  ZOLOFT  Take 150 mg by mouth daily.     tamsulosin 0.4 MG Caps capsule  Commonly known as:  FLOMAX  Take 1 capsule (0.4 mg total) by mouth daily.     temazepam 15 MG capsule  Commonly known as:  RESTORIL  Take one capsule by mouth every night at bedtime as needed for sleep         Duration of Discharge Encounter: Greater than 30 minutes including physician time.  Angelena Form PA-C 04/12/2015 9:10 AM   Personally seen and examined. Agree with above. Doing well post pacer Final Xray read pending OK for DC once read in.  Meds reviewed Device appears normal.   Candee Furbish, MD

## 2015-04-12 NOTE — Care Management Note (Signed)
Case Management Note  Patient Details  Name: [name] MRN: [id number] Date of Birth: 1925-06-02  Subjective/Objective:    chest pain and racing HR                Action/Plan: CM spoke to patient, spouse, and adult son at the bedside. Pt states that he is going home today with his spouse and denies further needs at home. PT wears oxygen at home and states that he gets the equipment and supplies from [facility]. Pt has a walker at home along with shower chair and shower handles. Pt denies further DME needs at home. No further CM needs identified. [employer] rep in to speak with patient at the bedside.   Expected Discharge Date:  04/12/15              Expected Discharge Plan:  Home/Self Care  In-House Referral:     Discharge planning Services  CM Consult  Post Acute Care Choice:    Choice offered to:     DME Arranged:    DME Agency:     HH Arranged:    HH Agency:     Status of Service:  Completed, signed off  Medicare Important Message Given:    Date Medicare IM Given:    Medicare IM give by:    Date Additional Medicare IM Given:    Additional Medicare Important Message give by:     If discussed at [place] of Stay Meetings, dates discussed:    Additional Comments:  Guido Sander, RN 04/12/2015, 11:22 AM

## 2015-04-12 NOTE — Progress Notes (Signed)
    Subjective:  No complaints  Objective:  Vital Signs in the last 24 hours: Temp:  [97.6 F (36.4 C)-98.3 F (36.8 C)] 97.6 F (36.4 C) (08/13 0400) Pulse Rate:  [0-194] 73 (08/12 2000) Resp:  [10-31] 19 (08/12 2000) BP: (117-166)/(49-85) 120/82 mmHg (08/12 2000) SpO2:  [91 %-100 %] 93 % (08/12 2000)  Intake/Output from previous day: No intake or output data in the 24 hours ending 04/12/15 0927  Physical Exam: General appearance: alert, cooperative and no distress Lungs: clear to auscultation bilaterally and kyphoscoliosis Heart: regular rate and rhythm and 2/6 systolic murmur Pacer site without hematoma   Rate: 72  Rhythm: paced  Lab Results:  Recent Labs  04/10/15 1032 04/10/15 1053 04/11/15 0756  WBC 5.9  --  5.8  HGB 13.3 13.6 12.8*  PLT 186  --  194    Recent Labs  04/10/15 1053 04/11/15 0756  NA 133* 134*  K 4.4 4.4  CL 99* 99*  CO2  --  28  GLUCOSE 119* 98  BUN 11 9  CREATININE 0.80 0.89    Recent Labs  04/11/15 0049 04/11/15 0756  TROPONINI <0.03 <0.03    Recent Labs  04/11/15 0756  INR 1.16    Scheduled Meds: . acetaminophen  500 mg Oral QHS  . aspirin EC  81 mg Oral Daily  . atorvastatin  40 mg Oral q1800  . budesonide-formoterol  2 puff Inhalation BID  . calcium-vitamin D  2 tablet Oral Daily  . carbamazepine  300 mg Oral BID  .  ceFAZolin (ANCEF) IV  2 g Intravenous On Call  . clopidogrel  75 mg Oral Daily  . docusate sodium  100 mg Oral Daily  . famotidine  20 mg Oral BID  . finasteride  5 mg Oral Daily  . furosemide  40 mg Oral q morning - 10a  . gentamicin irrigation  80 mg Irrigation On Call  . metoprolol succinate  12.5 mg Oral Daily  . multivitamin with minerals  1 tablet Oral Daily  . potassium chloride SA  20 mEq Oral Daily  . sertraline  150 mg Oral Daily  . tamsulosin  0.4 mg Oral Daily   Continuous Infusions: . sodium chloride    . sodium chloride    . sodium chloride    . nitroGLYCERIN Stopped  (04/11/15 0649)   PRN Meds:.acetaminophen, fentaNYL (SUBLIMAZE) injection, ipratropium, nitroGLYCERIN, ondansetron (ZOFRAN) IV, senna, temazepam   Imaging: CXR pending   Assessment/Plan:   Principal Problem:   Unstable angina Active Problems:   Dyslipidemia (high LDL; low HDL)   LBBB (left bundle branch block)   Hx of CABG and re-do CABG    Atrial flutter, RVR   CAD (coronary artery disease), previous stents to mLAD,    PLAN: DC pending CXR, will resume Plavix  Kerin Ransom PA-C 04/12/2015, 9:27 AM [phone]  Personally seen and examined. Agree with above. Pacer intact Not anticoagulation candidate (Dr. Mare Ferrari) Falls.  OK for DC once radiology read final  Discussed with wife. Candee Furbish, MD

## 2015-04-14 ENCOUNTER — Encounter (HOSPITAL_COMMUNITY): Payer: Self-pay | Admitting: Cardiology

## 2015-04-16 ENCOUNTER — Other Ambulatory Visit: Payer: Medicare Other

## 2015-04-22 ENCOUNTER — Telehealth: Payer: Self-pay | Admitting: Internal Medicine

## 2015-04-22 NOTE — Telephone Encounter (Signed)
Spoke with the pt  I advised will need to come in for eval with MW since it has been so long since last visit  He states will call for appt when he has time He states he is not acutely ill and nothing needed at the present time

## 2015-04-23 ENCOUNTER — Ambulatory Visit (INDEPENDENT_AMBULATORY_CARE_PROVIDER_SITE_OTHER): Payer: Medicare Other | Admitting: *Deleted

## 2015-04-23 ENCOUNTER — Encounter: Payer: Self-pay | Admitting: Cardiology

## 2015-04-23 ENCOUNTER — Ambulatory Visit (INDEPENDENT_AMBULATORY_CARE_PROVIDER_SITE_OTHER): Payer: Medicare Other | Admitting: Cardiology

## 2015-04-23 VITALS — BP 146/76 | HR 68 | Ht 70.0 in | Wt 159.8 lb

## 2015-04-23 DIAGNOSIS — R001 Bradycardia, unspecified: Secondary | ICD-10-CM | POA: Diagnosis not present

## 2015-04-23 DIAGNOSIS — Z950 Presence of cardiac pacemaker: Secondary | ICD-10-CM | POA: Diagnosis not present

## 2015-04-23 DIAGNOSIS — I447 Left bundle-branch block, unspecified: Secondary | ICD-10-CM

## 2015-04-23 DIAGNOSIS — I259 Chronic ischemic heart disease, unspecified: Secondary | ICD-10-CM | POA: Diagnosis not present

## 2015-04-23 DIAGNOSIS — I495 Sick sinus syndrome: Secondary | ICD-10-CM

## 2015-04-23 LAB — CUP PACEART INCLINIC DEVICE CHECK
Battery Impedance: 100 Ohm
Battery Remaining Longevity: 136 mo
Brady Statistic AP VP Percent: 0 %
Brady Statistic AS VP Percent: 0 %
Brady Statistic AS VS Percent: 20 %
Date Time Interrogation Session: 20160824093204
Lead Channel Pacing Threshold Amplitude: 0.5 V
Lead Channel Pacing Threshold Amplitude: 0.75 V
Lead Channel Pacing Threshold Pulse Width: 0.4 ms
Lead Channel Sensing Intrinsic Amplitude: 4 mV
Lead Channel Sensing Intrinsic Amplitude: 8 mV
Lead Channel Setting Pacing Amplitude: 3.5 V
Lead Channel Setting Pacing Pulse Width: 0.4 ms
MDC IDC MSMT BATTERY VOLTAGE: 2.79 V
MDC IDC MSMT LEADCHNL RA IMPEDANCE VALUE: 568 Ohm
MDC IDC MSMT LEADCHNL RV IMPEDANCE VALUE: 638 Ohm
MDC IDC MSMT LEADCHNL RV PACING THRESHOLD PULSEWIDTH: 0.4 ms
MDC IDC SET LEADCHNL RV PACING AMPLITUDE: 3.5 V
MDC IDC SET LEADCHNL RV SENSING SENSITIVITY: 2.8 mV
MDC IDC STAT BRADY AP VS PERCENT: 80 %

## 2015-04-23 NOTE — Progress Notes (Signed)
Wound check appointment. Steri-strips removed. Wound without redness or edema. Incision edges approximated, wound well healed. Normal device function. Thresholds, sensing, and impedances consistent with implant measurements. Device programmed at 3.5V/auto capture programmed on for extra safety margin until 3 month visit. Histogram distribution appropriate for patient and level of activity. No mode switches or high ventricular rates noted. Patient educated about wound care, arm mobility, lifting restrictions. ROV w/ WC in 67mo.

## 2015-04-23 NOTE — Progress Notes (Signed)
Cardiology Office Note   Date:  04/23/2015   ID:  [name], DOB 30-Aug-1925, MRN [id number]  PCP:  Shane Compton, MD  Cardiologist: Shane Coco MD  Chief Complaint  Patient presents with  . Follow-up      History of Present Illness: [name] is a 81 y.o. male who presents for posthospital follow-up office visit  81 y/o male with a history of extensive ischemic heart disease. He had CABG in 1986. He had a redo CABG in 1993. He had PTCA with stent in October 2008. He had a DES in February 2010. He had a small non-Q MI in August 2010.He presented to the emergency room on 07/15/12 after falling at home. While in the emergency room he developed chest pain followed by ventricular tachycardia requiring cardioversion. He was taken emergently to the cath lab and treated with a BMS to the mid LAD. He was treated initially with amiodarone which was subsequently stopped prior to discharge secondary to side effects. His echocardiogram November 2013 showed ejection fraction 40-45% and moderate left atrial enlargement. He has chronic LBBB. In Jan 2016 he had an episode of syncope and chest pain. Myoview showed scar. He has had bradycardia documented in the past. He presented to the ED 04/10/15 with tachycardia and was found to be in A flutter with variable VR. CHADs VASc =4.  He also had periods of marked bradycardia.  He was felt to have tachybradycardia syndrome.  He underwent placement of a MDT PTVDP 7/34/28 without complications. He has a history of falls. He has drop foot on the right as well as a prior Rt hip replacement. His last fall was 4 weeks ago. He is not felt to be a candidate for anticoagulation.  The patient remains on dual antiplatelets therapy for his extensive coronary artery disease. Since discharge from the hospital he has had no angina.  He has had no recurrent atrial fibrillation or tachycardia.  He has no further falls.  He does have fluctuating blood pressure  which tends to be low after he takes his noon time dose of isosorbide mononitrate. He has a history of nocturnal hypoxemia and wears nasal oxygen at night.  He has a upcoming visit with Dr. Melvyn Perez  to determine whether he still needs this. Past Medical History  Diagnosis Date  . Systolic CHF        . Dyslipidemia (high LDL; low HDL)        . Peripheral vascular disease     thrombus- in leg- many yrs. ago- ?R- coumadin x1 mth  . Peripheral neuropathy   . Hiatal hernia   . Depression        . VT (ventricular tachycardia) 06/2012    a. in the setting of STEMI b. on amio during hospitalization  . Prostatitis   . Hypertension        . Coronary artery disease     a. s/p CABG 1986 b. redo CABG 1993 c. PTCA 2008 d. DES 2010  . Insomnia        . GERD (gastroesophageal reflux disease)        . Peripheral neuropathy        . Diverticulosis   . Kidney stones   . Complication of anesthesia     B/P dropped low and had to stay in recovery longer  . Hyperlipemia   . Asthma        . Pneumonia 1960's  . On home oxygen therapy     "  2L; just at night" (01/31/2015)  . Anemia 2014    was on iron pills and then was able to come off  . Hepatitis     hepatitis- long time ago, occupational contamination   . Arthritis     "all over"  . Prostate cancer     radiation therapy- 2002  . Atrial flutter     a. diagnosed 03/2015    Past Surgical History  Procedure Laterality Date  . Cataract extraction w/ intraocular lens  implant, bilateral    . Fracture surgery      heel- crushed -2002,  (hardware)   . Joint replacement      R great toe  . Rhinoplasty  [id number]  . Lumbar laminectomy/decompression microdiscectomy  08/10/2011    Procedure: LUMBAR LAMINECTOMY/DECOMPRESSION MICRODISCECTOMY;  Surgeon: Cooper Render Pool;  Location: [place] [facility];  Service: Neurosurgery;  Laterality: Right;  RIGHT Lumbar five-sacral one LAMINECTOMY, MICRODISCECTOMY  . Lumbar wound debridement  08/22/2011    Procedure:  LUMBAR WOUND DEBRIDEMENT;  Surgeon: Eustace Moore;  Location: [place] [facility];  Service: Neurosurgery;  Laterality: N/A;  Repair of CSF Leak requiring laminectomy  . Cystoscopy w/ ureteral stent placement Right 02/28/2013    Procedure: CYSTOSCOPY WITH RETROGRADE PYELOGRAM/URETERAL STENT PLACEMENT;  Surgeon: Dutch Gray, MD;  Location: [facility];  Service: Urology;  Laterality: Right;  . Tonsillectomy  1940  . Appendectomy  1943  . Hammer toe surgery Right     little toe  . Transurethral resection of prostate  x2  . Foot fusion Left 2000    heel  . Hardware removal Right 2002    great toe; Dr. Sharol Given  . Cystoscopy with ureteroscopy and stent placement Right 03/23/2013    Procedure: RIGHT URETEROSCOPY, LASER LITHO AND STENT PLACEMENT;  Surgeon: Fredricka Bonine, MD;  Location: [facility];  Service: Urology;  Laterality: Right;  . Holmium laser application Right 6/81/2751    Procedure: HOLMIUM LASER APPLICATION;  Surgeon: Fredricka Bonine, MD;  Location: [facility];  Service: Urology;  Laterality: Right;  . Intramedullary (im) nail intertrochanteric Right 08/29/2013    Procedure: INTRAMEDULLARY (IM) NAIL INTERTROCHANTRIC;  Surgeon: Marianna Payment, MD;  Location: [place];  Service: Orthopedics;  Laterality: Right;  . Left and right heart catheterization with coronary angiogram N/A 07/15/2012    Procedure: LEFT AND RIGHT HEART CATHETERIZATION WITH CORONARY ANGIOGRAM;  Surgeon: Jettie Booze, MD;  Location: [facility] CATH LAB;  Service: Cardiovascular;  Laterality: N/A;  . Percutaneous coronary stent intervention (pci-s) N/A 07/15/2012    Procedure: PERCUTANEOUS CORONARY STENT INTERVENTION (PCI-S);  Surgeon: Jettie Booze, MD;  Location: [facility] C[facility] LAB;  Service: Cardiovascular;  Laterality: N/A;  . Coronary artery bypass graft  1986;  1993    CABG X 5; CABG X 1  . Colonoscopy    . Knee arthroscopy Right X 2  . Inguinal hernia repair Left 01/31/2015    Procedure: OPEN REPAIR RECURRENT LEFT INGUINAL  HERNIA;  Surgeon: Fanny Skates, MD;  Location: [place];  Service: General;  Laterality: Left;  . Insertion of mesh Left 01/31/2015    Procedure: INSERTION OF MESH;  Surgeon: Fanny Skates, MD;  Location: [place];  Service: General;  Laterality: Left;  . Ep implantable device N/A 04/11/2015    Procedure: Pacemaker Implant;  Surgeon: Will Meredith Leeds, MD;  Location: [place] CV LAB;  Service: Cardiovascular;  Laterality: N/A;     Current Outpatient Prescriptions  Medication Sig Dispense Refill  . acetaminophen (TYLENOL) 500  MG tablet Take 500 mg by mouth at bedtime. Take at bedtime every night per patient    . aspirin EC 81 MG EC tablet Take 1 tablet (81 mg total) by mouth daily.    [name] atorvastatin (LIPITOR) 40 MG tablet Take 40 mg by mouth daily at 6 PM.    . budesonide-formoterol (SYMBICORT) 80-4.5 MCG/ACT inhaler Inhale 2 puffs into the lungs 2 (two) times daily.    . calcium-vitamin D (OSCAL WITH D) 500-200 MG-UNIT per tablet Take 2 tablets by mouth daily.    . carbamazepine (TEGRETOL) 100 MG chewable tablet Chew 300 mg by mouth 2 (two) times daily.    . clopidogrel (PLAVIX) 75 MG tablet Take 1 tablet (75 mg total) by mouth daily.    [name] docusate sodium (COLACE) 100 MG capsule Take 100 mg by mouth daily.    . finasteride (PROSCAR) 5 MG tablet Take 5 mg by mouth daily.     . furosemide (LASIX) 40 MG tablet Take 40 mg by mouth every morning.    [name] ipratropium (ATROVENT HFA) 17 MCG/ACT inhaler Inhale 2 puffs into the lungs 2 (two) times daily as needed for wheezing.     . isosorbide mononitrate (IMDUR) 60 MG 24 hr tablet Take 90 mg by mouth daily.     . metoprolol succinate (TOPROL-XL) 12.5 mg TB24 24 hr tablet Take 12.5 mg by mouth daily.    . Multiple Vitamins-Minerals (MULTIVITAMIN WITH MINERALS) tablet Take 1 tablet by mouth daily.    [name] NITROSTAT 0.4 MG SL tablet PLACE 1 TABLET UNDER THE TONGUE EVERY 5 MINUTES ASDIRECTED BY PHYSICIAN 25 tablet 3  . potassium chloride SA (K-DUR,KLOR-CON) 20  MEQ tablet Take 20 mEq by mouth daily.     . ranitidine (ZANTAC) 150 MG tablet Take 1 tablet (150 mg total) by mouth 2 (two) times daily. 180 tablet 3  . senna (SENOKOT) 8.6 MG tablet Take 1 tablet by mouth daily as needed for constipation.     . sertraline (ZOLOFT) 100 MG tablet Take 100 mg by mouth as directed. 1 AND 1/2 TABLET BY MOUTH DAILY    . tamsulosin (FLOMAX) 0.4 MG CAPS capsule Take 1 capsule (0.4 mg total) by mouth daily. 30 capsule 0  . temazepam (RESTORIL) 15 MG capsule Take one capsule by mouth every night at bedtime as needed for sleep 30 capsule 5   No current facility-administered medications for this visit.    Allergies:   Review of patient's allergies indicates no known allergies.    Social History:  The patient  reports that he has never smoked. He has never used smokeless tobacco. He reports that he does not drink alcohol or use illicit drugs.   Family History:  The patient's family history includes Addison's disease in his father; Heart attack in his mother and sister. There is no history of Anesthesia problems, Hypotension, Malignant hyperthermia, or Pseudochol deficiency.    ROS:  Please see the history of present illness.   Otherwise, review of systems are positive for none.   All other systems are reviewed and negative.    PHYSICAL EXAM: VS:  BP 146/76 mmHg  Pulse 68  Ht 5\' 10"  (1.778 m)  Wt 159 lb 12.8 oz (72.485 kg)  BMI 22.93 kg/m2 , BMI Body mass index is 22.93 kg/(m^2). GEN: Well nourished, well developed, in no acute distress HEENT: normal Neck: no JVD, carotid bruits, or masses Cardiac: Atrial paced rhythm at 60/min. grade 1/6 systolic ejection murmur at the base.  No S3 gallop.  Strong pedal pulses.  No phlebitis or edema. Respiratory:  clear to auscultation bilaterally, normal work of breathing GI: soft, nontender, nondistended, + BS MS: no deformity or atrophy.  He is wearing a brace on his right ankle to prevent foot drop Skin: warm and dry, no  rash Neuro:  Strength and sensation are intact Psych: euthymic mood, full affect   EKG:  EKG is ordered today. The ekg ordered today demonstrates atrial paced rhythm.  Left bundle branch block.   Recent Labs: 01/23/2015: ALT 16* 04/10/2015: Magnesium 2.0; TSH 4.902* 04/11/2015: BUN 9; Creatinine, Ser 0.89; Hemoglobin 12.8*; Platelets 194; Potassium 4.4; Sodium 134*    Lipid Panel    Component Value Date/Time   CHOL 179 04/11/2015 0227   TRIG 38 04/11/2015 0227   HDL 68 04/11/2015 0227   CHOLHDL 2.6 04/11/2015 0227   VLDL 8 04/11/2015 0227   LDLCALC 103* 04/11/2015 0227      Wt Readings from Last 3 Encounters:  04/23/15 159 lb 12.8 oz (72.485 kg)  04/10/15 153 lb (69.4 kg)  01/31/15 170 lb (77.111 kg)        ASSESSMENT AND PLAN:  1. Ischemic heart disease status post CABG in 1986, redo CABG in 1993, PTCA with stent in 2008, drug-eluting stent in 2010, bare-metal stent to the mid LAD in November 2013. 2. left bundle-branch block 3. ischemic cardiomyopathy with ejection fraction of 50-55% by echocardiogram in June 2014. Ejection fraction 58% by Myoview in January 2016 4. Status post acute right hip fracture secondary to a mechanical fall in December 2014. 5. osteoarthritis with painful right knee 6. history of renal calculus with right hydronephrosis followed by Dr. [name] 7. Dyslipidemia 8. orthostatic hypotension 9. Peripheral neuropathy, on Carbatrol from [facility] 10. Tachybradycardia syndrome with recently placed [employer] dual chamber pacemaker.  Doing well   Current medicines are reviewed at length with the patient today.  The patient does not have concerns regarding medicines.  The following changes have been made:  no change  Labs/ tests ordered today include:   Orders Placed This Encounter  Procedures  . EKG 12-Lead     Disposition: Continue current medication.  Recheck in 3 months for follow-up office visit.  He will continue to be followed  at the [facility].  He now goes to [place] instead of the [facility].  Signed, Shane Coco MD 04/23/2015 10:57 AM    [address], [place], [place]  [address] Phone: [phone]; Fax: [phone]

## 2015-04-23 NOTE — Patient Instructions (Signed)
Medication Instructions:  Your physician recommends that you continue on your current medications as directed. Please refer to the Current Medication list given to you today.  Labwork: none  Testing/Procedures: none  Follow-Up: Your physician recommends that you schedule a follow-up appointment in: 3 month ov

## 2015-05-12 ENCOUNTER — Encounter: Payer: Self-pay | Admitting: Family Medicine

## 2015-05-22 ENCOUNTER — Encounter: Payer: Self-pay | Admitting: Cardiology

## 2015-06-04 ENCOUNTER — Ambulatory Visit (INDEPENDENT_AMBULATORY_CARE_PROVIDER_SITE_OTHER): Payer: Medicare Other | Admitting: *Deleted

## 2015-06-04 DIAGNOSIS — Z23 Encounter for immunization: Secondary | ICD-10-CM

## 2015-06-04 NOTE — Progress Notes (Signed)
Pt in RN clinic for flu shot.  Afebrile and no history to reaction in past.  Pt tolerated injection well. Fleeger, Salome Spotted

## 2015-07-18 ENCOUNTER — Other Ambulatory Visit: Payer: Self-pay | Admitting: Cardiology

## 2015-07-18 DIAGNOSIS — G4700 Insomnia, unspecified: Secondary | ICD-10-CM

## 2015-07-18 NOTE — Telephone Encounter (Signed)
Pt calling requesting a refill on Temazepam 15 mg tablet. Please advise

## 2015-07-20 MED ORDER — TEMAZEPAM 15 MG PO CAPS: ORAL_CAPSULE | ORAL | Status: DC

## 2015-07-28 ENCOUNTER — Ambulatory Visit (INDEPENDENT_AMBULATORY_CARE_PROVIDER_SITE_OTHER): Payer: Medicare Other | Admitting: *Deleted

## 2015-07-28 ENCOUNTER — Ambulatory Visit: Payer: Medicare Other | Admitting: Cardiology

## 2015-07-28 ENCOUNTER — Encounter: Payer: Self-pay | Admitting: Cardiology

## 2015-07-28 DIAGNOSIS — R001 Bradycardia, unspecified: Secondary | ICD-10-CM

## 2015-07-28 LAB — CUP PACEART INCLINIC DEVICE CHECK
Battery Voltage: 2.79 V
Brady Statistic AP VS Percent: 86 %
Brady Statistic AS VP Percent: 0 %
Brady Statistic AS VS Percent: 14 %
Implantable Lead Implant Date: 20160812
Implantable Lead Implant Date: 20160812
Implantable Lead Location: 753859
Implantable Lead Model: 5076
Lead Channel Impedance Value: 539 Ohm
Lead Channel Pacing Threshold Amplitude: 0.5 V
Lead Channel Pacing Threshold Amplitude: 0.625 V
Lead Channel Pacing Threshold Amplitude: 0.75 V
Lead Channel Pacing Threshold Pulse Width: 0.4 ms
Lead Channel Pacing Threshold Pulse Width: 0.4 ms
Lead Channel Sensing Intrinsic Amplitude: 4 mV
Lead Channel Sensing Intrinsic Amplitude: 8 mV
Lead Channel Setting Pacing Amplitude: 2 V
Lead Channel Setting Pacing Amplitude: 2.5 V
Lead Channel Setting Pacing Pulse Width: 0.4 ms
MDC IDC LEAD LOCATION: 753860
MDC IDC LEAD MODEL: 1948
MDC IDC MSMT BATTERY IMPEDANCE: 100 Ohm
MDC IDC MSMT BATTERY REMAINING LONGEVITY: 149 mo
MDC IDC MSMT LEADCHNL RA PACING THRESHOLD AMPLITUDE: 0.5 V
MDC IDC MSMT LEADCHNL RA PACING THRESHOLD PULSEWIDTH: 0.4 ms
MDC IDC MSMT LEADCHNL RA PACING THRESHOLD PULSEWIDTH: 0.4 ms
MDC IDC MSMT LEADCHNL RV IMPEDANCE VALUE: 624 Ohm
MDC IDC SESS DTM: 20161128110519
MDC IDC SET LEADCHNL RV SENSING SENSITIVITY: 2.8 mV
MDC IDC STAT BRADY AP VP PERCENT: 0 %

## 2015-07-28 NOTE — Progress Notes (Signed)
Pacemaker check in clinic. Normal device function. Thresholds, sensing, impedances consistent with previous measurements. Device programmed to maximize longevity. No mode switch or high ventricular rates noted. Device programmed at appropriate safety margins. Histogram distribution appropriate for patient activity level. Device programmed to optimize intrinsic conduction. Estimated longevity12.61yrs. Today should have been 33mo post implant w/ WC. ROV w/ WC 10/28/15.

## 2015-09-18 ENCOUNTER — Ambulatory Visit (INDEPENDENT_AMBULATORY_CARE_PROVIDER_SITE_OTHER): Payer: Medicare Other | Admitting: Cardiology

## 2015-09-18 ENCOUNTER — Encounter: Payer: Self-pay | Admitting: Cardiology

## 2015-09-18 VITALS — BP 126/50 | HR 65 | Ht 70.0 in | Wt 161.8 lb

## 2015-09-18 DIAGNOSIS — I447 Left bundle-branch block, unspecified: Secondary | ICD-10-CM

## 2015-09-18 DIAGNOSIS — I259 Chronic ischemic heart disease, unspecified: Secondary | ICD-10-CM

## 2015-09-18 DIAGNOSIS — I10 Essential (primary) hypertension: Secondary | ICD-10-CM

## 2015-09-18 NOTE — Patient Instructions (Signed)
Medication Instructions:  Your physician recommends that you continue on your current medications as directed. Please refer to the Current Medication list given to you today.  Labwork: none  Testing/Procedures: none  Follow-Up: Your physician recommends that you schedule a follow-up appointment in: 4 month ov with Dr Stanford Breed   If you need a refill on your cardiac medications before your next appointment, please call your pharmacy.

## 2015-09-18 NOTE — Progress Notes (Signed)
Cardiology Office Note   Date:  09/18/2015   ID:  [name], DOB [birth year], MRN [id number]  PCP:  Paula Compton, MD  Cardiologist: Darlin Coco MD  Chief Complaint  Patient presents with  . routine office visit    Patient c/o chest pain with exertion, shortness of breath. Denies le edema and claudication      History of Present Illness: [name] is a 82 y.o. male who presents for scheduled four-month office visit  82 y/o male with a history of extensive ischemic heart disease. He had CABG in 1986. He had a redo CABG in 1993. He had PTCA with stent in October 2008. He had a DES in February 2010. He had a small non-Q MI in August 2010.He presented to the emergency room on 07/15/12 after falling at home. While in the emergency room he developed chest pain followed by ventricular tachycardia requiring cardioversion. He was taken emergently to the cath lab and treated with a BMS to the mid LAD. He was treated initially with amiodarone which was subsequently stopped prior to discharge secondary to side effects. His echocardiogram November 2013 showed ejection fraction 40-45% and moderate left atrial enlargement. He has chronic LBBB. In Jan 2016 he had an episode of syncope and chest pain. Myoview showed scar. He has had bradycardia documented in the past. He presented to the ED 04/10/15 with tachycardia and was found to be in A flutter with variable VR. CHADs VASc =4. He also had periods of marked bradycardia. He was felt to have tachybradycardia syndrome. He underwent placement of a MDT PTVDP [id number] without complications. He has a history of falls. He has drop foot on the right as well as a prior Rt hip replacement. [name] He is not felt to be a candidate for anticoagulation. The patient remains on dual antiplatelets therapy for his extensive coronary artery disease. Since discharge from the hospital he has had no angina. He has had no recurrent atrial fibrillation or  tachycardia. He has no further falls. He does have fluctuating blood pressure which tends to be low after he takes his noon time dose of isosorbide mononitrate. He has occasional exertional angina pectoris relieved by sublingual nitroglycerin.  Typically he will get angina after he has been at church on Sundays and has sat for 3 hours and then when he is walking to the car to go after he he will get chest tightness relieved by sublingual nitroglycerin. He has a history of a heart murmur.  Echocardiogram on 09/04/14 showed ejection fraction of 55-60% with grade 1 diastolic dysfunction and aortic valve sclerosis without stenosis. Past Medical History  Diagnosis Date  . Systolic CHF ([place])        . Dyslipidemia (high LDL; low HDL)        . Peripheral vascular disease ([facility])     thrombus- in leg- many yrs. ago- ?R- coumadin x1 mth  . Peripheral neuropathy ([place])   . Hiatal hernia   . Depression        . VT (ventricular tachycardia) ([place]) 06/2012    a. in the setting of STEMI b. on amio during hospitalization  . Prostatitis   . Hypertension        . Coronary artery disease     a. s/p CABG 1986 b. redo CABG 1993 c. PTCA 2008 d. DES 2010  . Insomnia        . GERD (gastroesophageal reflux disease)        .  Peripheral neuropathy ([place])        . Diverticulosis   . Kidney stones   . Complication of anesthesia     B/P dropped low and had to stay in recovery longer  . Hyperlipemia   . Asthma        . Pneumonia 1960's  . On home oxygen therapy     "2L; just at night" (01/31/2015)  . Anemia 2014    was on iron pills and then was able to come off  . Hepatitis     hepatitis- long time ago, occupational contamination   . Arthritis     "all over"  . Prostate cancer [facility])     radiation therapy- 2002  . Atrial flutter ([place])     a. diagnosed 03/2015    Past Surgical History  Procedure Laterality Date  . Cataract extraction w/ intraocular lens  implant, bilateral    . Fracture surgery       heel- crushed -2002,  (hardware)   . Joint replacement      R great toe  . Rhinoplasty  [id number]  . Lumbar laminectomy/decompression microdiscectomy  08/10/2011    Procedure: LUMBAR LAMINECTOMY/DECOMPRESSION MICRODISCECTOMY;  Surgeon: Cooper Render Pool;  Location: [place] [facility];  Service: Neurosurgery;  Laterality: Right;  RIGHT Lumbar five-sacral one LAMINECTOMY, MICRODISCECTOMY  . Lumbar wound debridement  08/22/2011    Procedure: LUMBAR WOUND DEBRIDEMENT;  Surgeon: Eustace Moore;  Location: [place] [facility];  Service: Neurosurgery;  Laterality: N/A;  Repair of CSF Leak requiring laminectomy  . Cystoscopy w/ ureteral stent placement Right 02/28/2013    Procedure: CYSTOSCOPY WITH RETROGRADE PYELOGRAM/URETERAL STENT PLACEMENT;  Surgeon: Dutch Gray, MD;  Location: [facility];  Service: Urology;  Laterality: Right;  . Tonsillectomy  1940  . Appendectomy  1943  . Hammer toe surgery Right     little toe  . Transurethral resection of prostate  x2  . Foot fusion Left 2000    heel  . Hardware removal Right 2002    great toe; Dr. Sharol Given  . Cystoscopy with ureteroscopy and stent placement Right 03/23/2013    Procedure: RIGHT URETEROSCOPY, LASER LITHO AND STENT PLACEMENT;  Surgeon: Fredricka Bonine, MD;  Location: [facility];  Service: Urology;  Laterality: Right;  . Holmium laser application Right [id number]    Procedure: HOLMIUM LASER APPLICATION;  Surgeon: Fredricka Bonine, MD;  Location: [facility];  Service: Urology;  Laterality: Right;  . Intramedullary (im) nail intertrochanteric Right 08/29/2013    Procedure: INTRAMEDULLARY (IM) NAIL INTERTROCHANTRIC;  Surgeon: Marianna Payment, MD;  Location: [place];  Service: Orthopedics;  Laterality: Right;  . Left and right heart catheterization with coronary angiogram N/A 07/15/2012    Procedure: LEFT AND RIGHT HEART CATHETERIZATION WITH CORONARY ANGIOGRAM;  Surgeon: Jettie Booze, MD;  Location: [facility] CATH LAB;  Service: Cardiovascular;  Laterality: N/A;  .  Percutaneous coronary stent intervention (pci-s) N/A 07/15/2012    Procedure: PERCUTANEOUS CORONARY STENT INTERVENTION (PCI-S);  Surgeon: Jettie Booze, MD;  Location: [facility] CATH LAB;  Service: Cardiovascular;  Laterality: N/A;  . Coronary artery bypass graft  1986;  1993    CABG X 5; CABG X 1  . Colonoscopy    . Knee arthroscopy Right X 2  . Inguinal hernia repair Left 01/31/2015    Procedure: OPEN REPAIR RECURRENT LEFT INGUINAL HERNIA;  Surgeon: Fanny Skates, MD;  Location: [place];  Service: General;  Laterality: Left;  . Insertion of mesh Left 01/31/2015    Procedure:  INSERTION OF MESH;  Surgeon: Fanny Skates, MD;  Location: [place];  Service: General;  Laterality: Left;  . Ep implantable device N/A 04/11/2015    Procedure: Pacemaker Implant;  Surgeon: Will Meredith Leeds, MD;  Location: [place] CV LAB;  Service: Cardiovascular;  Laterality: N/A;     Current Outpatient Prescriptions  Medication Sig Dispense Refill  . acetaminophen (TYLENOL) 500 MG tablet Take 500 mg by mouth at bedtime. Take at bedtime every night per patient    . aspirin EC 81 MG EC tablet Take 1 tablet (81 mg total) by mouth daily.    [name] atorvastatin (LIPITOR) 40 MG tablet Take 40 mg by mouth daily at 6 PM.    . budesonide-formoterol (SYMBICORT) 80-4.5 MCG/ACT inhaler Inhale 2 puffs into the lungs 2 (two) times daily.    . calcium-vitamin D (OSCAL WITH D) 500-200 MG-UNIT per tablet Take 2 tablets by mouth daily.    . carbamazepine (TEGRETOL) 100 MG chewable tablet Chew 300 mg by mouth 2 (two) times daily.    . clopidogrel (PLAVIX) 75 MG tablet Take 1 tablet (75 mg total) by mouth daily.    [name] docusate sodium (COLACE) 100 MG capsule Take 100 mg by mouth daily.    . finasteride (PROSCAR) 5 MG tablet Take 5 mg by mouth daily.     . furosemide (LASIX) 40 MG tablet Take 40 mg by mouth every morning.    [name] ipratropium (ATROVENT HFA) 17 MCG/ACT inhaler Inhale 2 puffs into the lungs 2 (two) times daily as needed for wheezing.      . isosorbide mononitrate (IMDUR) 60 MG 24 hr tablet Take 90 mg by mouth daily.     . metoprolol succinate (TOPROL-XL) 12.5 mg TB24 24 hr tablet Take 12.5 mg by mouth daily.    . Multiple Vitamins-Minerals (MULTIVITAMIN WITH MINERALS) tablet Take 1 tablet by mouth daily.    [name] NITROSTAT 0.4 MG SL tablet PLACE 1 TABLET UNDER THE TONGUE EVERY 5 MINUTES ASDIRECTED BY PHYSICIAN 25 tablet 3  . potassium chloride SA (K-DUR,KLOR-CON) 20 MEQ tablet Take 20 mEq by mouth daily.     . ranitidine (ZANTAC) 150 MG tablet Take 1 tablet (150 mg total) by mouth 2 (two) times daily. 180 tablet 3  . senna (SENOKOT) 8.6 MG tablet Take 1 tablet by mouth daily as needed for constipation.     . sertraline (ZOLOFT) 100 MG tablet Take 150 mg by mouth as directed. 1 AND 1/2 TABLET BY MOUTH DAILY    . tamsulosin (FLOMAX) 0.4 MG CAPS capsule Take 1 capsule (0.4 mg total) by mouth daily. 30 capsule 0  . temazepam (RESTORIL) 15 MG capsule Take one capsule by mouth every night at bedtime as needed for sleep 30 capsule 5   No current facility-administered medications for this visit.    Allergies:   Review of patient's allergies indicates no known allergies.    Social History:  The patient  reports that he has never smoked. He has never used smokeless tobacco. He reports that he does not drink alcohol or use illicit drugs.   Family History:  The patient's family history includes Addison's disease in his father; Heart attack in his mother and sister. There is no history of Anesthesia problems, Hypotension, Malignant hyperthermia, or Pseudochol deficiency.    ROS:  Please see the history of present illness.   Otherwise, review of systems are positive for none.   All other systems are reviewed and negative.    PHYSICAL EXAM: VS:  BP 126/50 mmHg  Pulse 65  Ht 5\' 10"  (1.778 m)  Wt 161 lb 12.8 oz (73.392 kg)  BMI 23.22 kg/m2 , BMI Body mass index is 23.22 kg/(m^2). GEN: Well nourished, well developed, in no acute  distress HEENT: normal Neck: no JVD, carotid bruits, or masses Cardiac: RRR; , rubs, or gallops,no edema.there is a grade 2/6 systolic ejection murmur at the aortic area.  No diastolic murmur.  No gallop  Respiratory:  clear to auscultation bilaterally, normal work of breathing GI: soft, nontender, nondistended, + BS MS: no deformity or atrophy Skin: warm and dry, no rash Neuro:  Strength and sensation are intact Psych: euthymic mood, full affect   EKG:  EKG is ordered today. The ekg ordered today demonstrates atrial paced rhythm.  Left bundle branch block.  Since prior tracing of 04/23/15, no significant change   Recent Labs: 01/23/2015: ALT 16* 04/10/2015: Magnesium 2.0; TSH 4.902* 04/11/2015: BUN 9; Creatinine, Ser 0.89; Hemoglobin 12.8*; Platelets 194; Potassium 4.4; Sodium 134*    Lipid Panel    Component Value Date/Time   CHOL 179 04/11/2015 0227   TRIG 38 04/11/2015 0227   HDL 68 04/11/2015 0227   CHOLHDL 2.6 04/11/2015 0227   VLDL 8 04/11/2015 0227   LDLCALC 103* 04/11/2015 0227      Wt Readings from Last 3 Encounters:  09/18/15 161 lb 12.8 oz (73.392 kg)  04/23/15 159 lb 12.8 oz (72.485 kg)  04/10/15 153 lb (69.4 kg)         ASSESSMENT AND PLAN:  1. Ischemic heart disease status post CABG in 1986, redo CABG in 1993, PTCA with stent in 2008, drug-eluting stent in 2010, bare-metal stent to the mid LAD in November 2013.stable exertional angina pectoris.  Chest pain is relieved by sublingual nitroglycerin. 2. left bundle-branch block 3. ischemic cardiomyopathy with ejection fraction of 50-55% by echocardiogram in June 2014. Ejection fraction 58% by Myoview in January 2016.ejection fraction 55-60% in January 2016 by echo 4. Status post acute right hip fracture secondary to a mechanical fall in December 2014. 5. osteoarthritis with painful right knee 6. history of renal calculus with right hydronephrosis followed by Dr. [name] 7. Dyslipidemia 8. orthostatic  hypotension 9. Peripheral neuropathy, on Carbatrol from [facility] 10. Tachybradycardia syndrome with recently placed Medtronic dual chamber pacemaker. Doing well   Current medicines are reviewed at length with the patient today.  The patient does not have concerns regarding medicines.  The following changes have been made:  no change  Labs/ tests ordered today include:   Orders Placed This Encounter  Procedures  . EKG 12-Lead    Disposition: Reviewed his labs which she brought in from the [facility].  They are excellent.  The patient is to continue current medication.  Recheck in 4 months for follow-up office visit with Dr. Stanford Breed at [facility], Darlin Coco MD 09/18/2015 1:19 PM    [address], [place], [place]  [address] Phone: [phone]; Fax: [phone]

## 2015-10-15 ENCOUNTER — Telehealth: Payer: Self-pay | Admitting: Cardiology

## 2015-10-15 NOTE — Telephone Encounter (Signed)
Will forward to  Dr. Brackbill for review 

## 2015-10-15 NOTE — Telephone Encounter (Signed)
Okay to leave off Plavix for 5 days.  Continue baby aspirin.

## 2015-10-15 NOTE — Telephone Encounter (Signed)
Request for surgical clearance:  1. What type of surgery is being performed? Tooth extaction  2. When is this surgery scheduled? 10/20/15  3. Are there any medications that need to be held prior to surgery and how long? Plavix  4. Name of physician performing surgery? Dr Ignatius Specking  5. What is your office phone and fax number? [phone] 6.

## 2015-10-15 NOTE — Telephone Encounter (Signed)
Advised patient and faxed to Dr Ignatius Specking, confirmation received

## 2015-10-15 NOTE — Telephone Encounter (Signed)
New Message  Pt stated that he has tooth extraction sched for mon 2/20 and that he needs to know how long to be off plavix. Pt was made aware of policy that the Dentist office has to contact us directly. Pt requested to speak w/RN. Please call back and discuss.

## 2015-10-20 ENCOUNTER — Telehealth: Payer: Self-pay | Admitting: Family Medicine

## 2015-10-20 NOTE — Telephone Encounter (Signed)
Pt called because he is going to be having a evaluation for a walking aide on 10/22/15. This is at [facility] and they need something from the doctor stating that he is well enough to do this. Please fax this to [phone]. Blima Rich

## 2015-10-20 NOTE — Telephone Encounter (Signed)
Will forward to MD. Myracle Febres,CMA  

## 2015-10-23 NOTE — Telephone Encounter (Signed)
Unclear what exactly they need. Do they have a form? I have also never seen him even though I am his PCP and thus am hesitant to give approval without evaluating him. Thanks  CGM MD

## 2015-10-24 NOTE — Telephone Encounter (Signed)
Spoke with patient and he has already had his appt at the [facility].  They informed him that he doesn't qualify for a walking aide.  Patient states that he has an appt with [facility] in march and will call after that to make a physical with his pcp. Jazmin Hartsell,CMA

## 2015-10-27 ENCOUNTER — Other Ambulatory Visit: Payer: Self-pay | Admitting: Cardiology

## 2015-10-28 ENCOUNTER — Ambulatory Visit (INDEPENDENT_AMBULATORY_CARE_PROVIDER_SITE_OTHER): Payer: Medicare Other | Admitting: Cardiology

## 2015-10-28 ENCOUNTER — Encounter: Payer: Self-pay | Admitting: Cardiology

## 2015-10-28 VITALS — BP 138/76 | HR 76 | Ht 70.0 in | Wt 163.0 lb

## 2015-10-28 DIAGNOSIS — I495 Sick sinus syndrome: Secondary | ICD-10-CM

## 2015-10-28 LAB — CUP PACEART INCLINIC DEVICE CHECK
Battery Remaining Longevity: 149 mo
Battery Voltage: 2.78 V
Brady Statistic AP VS Percent: 81 %
Date Time Interrogation Session: 20170228131442
Implantable Lead Implant Date: 20160812
Implantable Lead Location: 753859
Implantable Lead Location: 753860
Implantable Lead Model: 1948
Implantable Lead Model: 5076
Lead Channel Impedance Value: 539 Ohm
Lead Channel Pacing Threshold Amplitude: 0.5 V
Lead Channel Pacing Threshold Pulse Width: 0.4 ms
Lead Channel Setting Pacing Amplitude: 2.5 V
MDC IDC LEAD IMPLANT DT: 20160812
MDC IDC MSMT BATTERY IMPEDANCE: 100 Ohm
MDC IDC MSMT LEADCHNL RA PACING THRESHOLD AMPLITUDE: 0.75 V
MDC IDC MSMT LEADCHNL RA PACING THRESHOLD PULSEWIDTH: 0.4 ms
MDC IDC MSMT LEADCHNL RA SENSING INTR AMPL: 4 mV
MDC IDC MSMT LEADCHNL RV IMPEDANCE VALUE: 622 Ohm
MDC IDC MSMT LEADCHNL RV SENSING INTR AMPL: 8 mV
MDC IDC SET LEADCHNL RA PACING AMPLITUDE: 2 V
MDC IDC SET LEADCHNL RV PACING PULSEWIDTH: 0.4 ms
MDC IDC SET LEADCHNL RV SENSING SENSITIVITY: 4 mV
MDC IDC STAT BRADY AP VP PERCENT: 0 %
MDC IDC STAT BRADY AS VP PERCENT: 0 %
MDC IDC STAT BRADY AS VS PERCENT: 19 %

## 2015-10-28 NOTE — Patient Instructions (Addendum)
Medication Instructions: No Change  Labwork: None ordered  Procedures/Testing: None ordered  Follow-Up: Your physician wants you to follow-up in August with Dr. Curt Bears. You will receive a reminder letter in the mail two months in advance. If you don't receive a letter, please call our office to schedule the follow-up appointment.  Remote monitoring is used to monitor your Pacemaker of ICD from home. This monitoring reduces the number of office visits required to check your device to one time per year. It allows [place] to keep an eye on the functioning of your device to ensure it is working properly. You are scheduled for a device check from home on 01/27/16. You may send your transmission at any time that day. If you have a wireless device, the transmission will be sent automatically. After your physician reviews your transmission, you will receive a postcard with your next transmission date.  If you need a refill on your cardiac medications before your next appointment, please call your pharmacy.   Thank you for choosing [facility]!!   Trinidad Curet, RN [phone]

## 2015-10-28 NOTE — Progress Notes (Signed)
Electrophysiology Office Note   Date:  10/28/2015   ID:  JUDEN PONTARELLI, DOB 05/29/1925, MRN [id number]  PCP:  Paula Compton, MD  Cardiologist:  Mare Ferrari Primary Electrophysiologist:  Colleena Kurtenbach Meredith Leeds, MD    No chief complaint on file.    History of Present Illness: [name] is a 82 y.o. male who presents today for electrophysiology evaluation.   He had CABG in 1986. He had a redo CABG in 1993. He had PTCA with stent in October 2008. He had a DES in February 2010. He had a small non-Q MI in August 2010.He presented to the emergency room on 07/15/12 after falling at home. While in the emergency room he developed chest pain followed by ventricular tachycardia requiring cardioversion. He was taken emergently to the cath lab and treated with a BMS to the mid LAD. He was treated initially with amiodarone which was subsequently stopped prior to discharge secondary to side effects. His echocardiogram November 2013 showed ejection fraction 40-45% and moderate left atrial enlargement. He has chronic LBBB. In Jan 2016 he had an episode of syncope and chest pain. Myoview showed scar. He has had bradycardia documented in the past. He presented to the ED 04/10/15 with tachycardia and was found to be in A flutter with variable VR. CHADs VASc =4. He also had periods of marked bradycardia. He was felt to have tachybradycardia syndrome. He underwent placement of a MDT PTVDP [id number] without complications.    Today, he denies symptoms of palpitations, chest pain, orthopnea, PND, lower extremity edema, claudication, dizziness, presyncope, syncope, bleeding, or neurologic sequela. The patient is tolerating medications without difficulties. Today he comes to clinic complaining of mild shortness of breath with exertion. He says that he had the flu and has been over that for the last 3 weeks. He says he feels like the shortness of breath could be related to the flu. He also continues to have the same  chest pain that he had previously while at church.    Past Medical History  Diagnosis Date  . Systolic CHF ([place])        . Dyslipidemia (high LDL; low HDL)        . Peripheral vascular disease (HCC)     thrombus- in leg- many yrs. ago- ?R- coumadin x1 mth  . Peripheral neuropathy ([place])   . Hiatal hernia   . Depression        . VT (ventricular tachycardia) ([place]) 06/2012    a. in the setting of STEMI b. on amio during hospitalization  . Prostatitis   . Hypertension        . Coronary artery disease     a. s/p CABG 1986 b. redo CABG 1993 c. PTCA 2008 d. DES 2010  . Insomnia        . GERD (gastroesophageal reflux disease)        . Peripheral neuropathy ([place])        . Diverticulosis   . Kidney stones   . Complication of anesthesia     B/P dropped low and had to stay in recovery longer  . Hyperlipemia   . Asthma        . Pneumonia 1960's  . On home oxygen therapy     "2L; just at night" (01/31/2015)  . Anemia 2014    was on iron pills and then was able to come off  . Hepatitis     hepatitis- long time ago, occupational contamination   . Arthritis     "  all over"  . Prostate cancer [name])     radiation therapy- 2002  . Atrial flutter ([place])     a. diagnosed 03/2015   Past Surgical History  Procedure Laterality Date  . Cataract extraction w/ intraocular lens  implant, bilateral    . Fracture surgery      heel- crushed -2002,  (hardware)   . Joint replacement      R great toe  . Rhinoplasty  [id number]  . Lumbar laminectomy/decompression microdiscectomy  08/10/2011    Procedure: LUMBAR LAMINECTOMY/DECOMPRESSION MICRODISCECTOMY;  Surgeon: Cooper Render Pool;  Location: [place] [facility];  Service: Neurosurgery;  Laterality: Right;  RIGHT Lumbar five-sacral one LAMINECTOMY, MICRODISCECTOMY  . Lumbar wound debridement  08/22/2011    Procedure: LUMBAR WOUND DEBRIDEMENT;  Surgeon: Eustace Moore;  Location: [place] [facility] ORS;  Service: Neurosurgery;  Laterality: N/A;  Repair of CSF Leak requiring  laminectomy  . Cystoscopy w/ ureteral stent placement Right 02/28/2013    Procedure: CYSTOSCOPY WITH RETROGRADE PYELOGRAM/URETERAL STENT PLACEMENT;  Surgeon: Dutch Gray, MD;  [facility];  Service: Urology;  Laterality: Right;  . Tonsillectomy  1940  . Appendectomy  1943  . Hammer toe surgery Right     little toe  . Transurethral resection of prostate  x2  . Foot fusion Left 2000    heel  . Hardware removal Right 2002    great toe; Dr. Sharol Given  . Cystoscopy with ureteroscopy and stent placement Right 03/23/2013    Procedure: RIGHT URETEROSCOPY, LASER LITHO AND STENT PLACEMENT;  Surgeon: Fredricka Bonine, MD;  [facility];  Service: Urology;  Laterality: Right;  . Holmium laser application Right [id number]    Procedure: HOLMIUM LASER APPLICATION;  Surgeon: Fredricka Bonine, MD;  [facility];  Service: Urology;  Laterality: Right;  . Intramedullary (im) nail intertrochanteric Right 08/29/2013    Procedure: INTRAMEDULLARY (IM) NAIL INTERTROCHANTRIC;  Surgeon: Marianna Payment, MD;  Location: [place];  Service: Orthopedics;  Laterality: Right;  . Left and right heart catheterization with coronary angiogram N/A 07/15/2012    Procedure: LEFT AND RIGHT HEART CATHETERIZATION WITH CORONARY ANGIOGRAM;  Surgeon: Jettie Booze, MD;  Location[facility] CATH LAB;  Service: Cardiovascular;  Laterality: N/A;  . Percutaneous coronary stent intervention (pci-s) N/A 07/15/2012    Procedure: PERCUTANEOUS CORONARY STENT INTERVENTION (PCI-S);  Surgeon: Jettie Booze, MD;  Location: [facility] CATH LAB;  Service: Cardiovascular;  Laterality: N/A;  . Coronary artery bypass graft  1986;  1993    CABG X 5; CABG X 1  . Colonoscopy    . Knee arthroscopy Right X 2  . Inguinal hernia repair Left 01/31/2015    Procedure: OPEN REPAIR RECURRENT LEFT INGUINAL HERNIA;  Surgeon: Fanny Skates, MD;  Location: [place];  Service: General;  Laterality: Left;  . Insertion of mesh Left 01/31/2015    Procedure:  INSERTION OF MESH;  Surgeon: Fanny Skates, MD;  Location: [place];  Service: General;  Laterality: Left;  . Ep implantable device N/A 04/11/2015    Procedure: Pacemaker Implant;  Surgeon: Cohan Stipes Meredith Leeds, MD;  Location: [place] CV LAB;  Service: Cardiovascular;  Laterality: N/A;     Current Outpatient Prescriptions  Medication Sig Dispense Refill  . acetaminophen (TYLENOL) 500 MG tablet Take 500 mg by mouth at bedtime. Take at bedtime every night per patient    . aspirin EC 81 MG EC tablet Take 1 tablet (81 mg total) by mouth daily.    [name] atorvastatin (LIPITOR) 40 MG  tablet Take 40 mg by mouth daily at 6 PM.    . budesonide-formoterol (SYMBICORT) 80-4.5 MCG/ACT inhaler Inhale 2 puffs into the lungs 2 (two) times daily.    . calcium-vitamin D (OSCAL WITH D) 500-200 MG-UNIT per tablet Take 2 tablets by mouth daily.    . carbamazepine (TEGRETOL) 100 MG chewable tablet Chew 300 mg by mouth 2 (two) times daily.    . clopidogrel (PLAVIX) 75 MG tablet Take 1 tablet (75 mg total) by mouth daily.    [name] docusate sodium (COLACE) 100 MG capsule Take 100 mg by mouth daily.    . finasteride (PROSCAR) 5 MG tablet Take 5 mg by mouth daily.     . furosemide (LASIX) 40 MG tablet Take 40 mg by mouth every morning.    [name] ipratropium (ATROVENT HFA) 17 MCG/ACT inhaler Inhale 2 puffs into the lungs 2 (two) times daily as needed for wheezing.     . isosorbide mononitrate (IMDUR) 60 MG 24 hr tablet Take 90 mg by mouth daily.     . metoprolol succinate (TOPROL-XL) 12.5 mg TB24 24 hr tablet Take 12.5 mg by mouth daily.    . Multiple Vitamins-Minerals (MULTIVITAMIN WITH MINERALS) tablet Take 1 tablet by mouth daily.    [name] NITROSTAT 0.4 MG SL tablet PLACE 1 TABLET UNDER THE TONGUE EVERY 5 MINUTES ASDIRECTED BY PHYSICIAN 25 tablet 3  . potassium chloride SA (K-DUR,KLOR-CON) 20 MEQ tablet Take 20 mEq by mouth daily.     . ranitidine (ZANTAC) 150 MG tablet Take 1 tablet (150 mg total) by mouth 2 (two) times daily. 180  tablet 3  . senna (SENOKOT) 8.6 MG tablet Take 1 tablet by mouth daily as needed for constipation.     . sertraline (ZOLOFT) 100 MG tablet Take 150 mg by mouth as directed. 1 AND 1/2 TABLET BY MOUTH DAILY    . tamsulosin (FLOMAX) 0.4 MG CAPS capsule Take 1 capsule (0.4 mg total) by mouth daily. 30 capsule 0  . temazepam (RESTORIL) 15 MG capsule Take one capsule by mouth every night at bedtime as needed for sleep 30 capsule 5   No current facility-administered medications for this visit.    Allergies:   Review of patient's allergies indicates no known allergies.   Social History:  The patient  reports that he has never smoked. He has never used smokeless tobacco. He reports that he does not drink alcohol or use illicit drugs.   Family History:  The patient's family history includes Addison's disease in his father; Heart attack in his mother and sister. There is no history of Anesthesia problems, Hypotension, Malignant hyperthermia, or Pseudochol deficiency.    ROS:  Please see the history of present illness.   Otherwise, review of systems is positive for cough, DOE, dizziness, bruising.   All other systems are reviewed and negative.    PHYSICAL EXAM: VS:  There were no vitals taken for this visit. , BMI There is no weight on file to calculate BMI. GEN: Well nourished, well developed, in no acute distress HEENT: normal Neck: no JVD, carotid bruits, or masses Cardiac: RRR; no murmurs, rubs, or gallops,no edema  Respiratory:  clear to auscultation bilaterally, normal work of breathing GI: soft, nontender, nondistended, + BS MS: no deformity or atrophy Skin: warm and dry,  device pocket is well healed Neuro:  Strength and sensation are intact Psych: euthymic mood, full affect  EKG:  EKG is not ordered today.   Device interrogation is reviewed today in detail.  See PaceArt for details.   Recent Labs: 01/23/2015: ALT 16* 04/10/2015: Magnesium 2.0; TSH 4.902* 04/11/2015: BUN 9;  Creatinine, Ser 0.89; Hemoglobin 12.8*; Platelets 194; Potassium 4.4; Sodium 134*    Lipid Panel     Component Value Date/Time   CHOL 179 04/11/2015 0227   TRIG 38 04/11/2015 0227   HDL 68 04/11/2015 0227   CHOLHDL 2.6 04/11/2015 0227   VLDL 8 04/11/2015 0227   LDLCALC 103* 04/11/2015 0227     Wt Readings from Last 3 Encounters:  09/18/15 161 lb 12.8 oz (73.392 kg)  04/23/15 159 lb 12.8 oz (72.485 kg)  04/10/15 153 lb (69.4 kg)      Other studies Reviewed: Additional studies/ records that were reviewed today include: TTE 08/2014  Review of the above records today demonstrates:  - Left ventricle: The cavity size was normal. Wall thickness was increased in a pattern of mild LVH. There was moderate focal basal hypertrophy of the septum. Systolic function was normal. The estimated ejection fraction was in the range of 55% to 60%. Incoordinate septal motion. Doppler parameters are consistent with abnormal left ventricular relaxation (grade 1 diastolic dysfunction). The E/e&' ratio is between 8-15, suggesting indeterminate LV filling pressure. - Aortic valve: Sclerosis without stenosis. There was no regurgitation. - Mitral valve: Mildly thickened leaflets . There was trivial regurgitation. - Left atrium: Moderately dilated at 41 ml/m2. - Pulmonary arteries: PA peak pressure: 33 mm Hg (S).   ASSESSMENT AND PLAN:  1.  Tachy-brady syndrome: s/p dual chamber pacemaker placement.    2. Ischemic heart disease: s/p CABG and multiple PCI.  Chest pain relieved with NTG, continue current management. Does have some minor chest pain while at church. This has been worked up and he takes nitroglycerin with improvement.  3. Shortness of breath on exertion: Likely due to recent influenza illness. I have told him that if this continues to call his physician for further workup.    Current medicines are reviewed at length with the patient today.   The patient does not have  concerns regarding his medicines.  The following changes were made today:  none  Labs/ tests ordered today include:  No orders of the defined types were placed in this encounter.     Disposition:   FU with Dashawn Golda 6 months  Signed, Intisar Claudio Meredith Leeds, MD  10/28/2015 8:50 AM     [facility] [address] [place] [address] [phone] (office) [phone] (fax)

## 2015-12-21 NOTE — Progress Notes (Signed)
HPI: FU CAD; previously followed by Dr Mare Ferrari. He had CABG in 1986. He had a redo CABG in 1993. He had PCI in 2008 and 2010. He presented to the emergency room on 07/15/12 after falling at home. While in the emergency room he developed chest pain followed by ventricular tachycardia requiring cardioversion. He was taken emergently to the cath lab and treated with a BMS to the mid LAD. 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 Also with h/o atrial flutter; not anticoagulated due to falls and anemia. Last cardiac catheterization November 2013 showed a 50% ostial left main. The LAD was occluded. There was a 90% lesion in the mid LAD past the insertion of the LIMA which was patent. There was a 50-60% left circumflex. Right coronary artery was occluded. Saphenous vein graft to the obtuse marginal and saphenous vein graft to diagonal occluded. LIMA to the RCA patent. Ejection fraction 50%; patient had PCI of his LAD at that time. Last echocardiogram January 2016 showed normal LV systolic function,Grade 1 diastolic dysfunction, moderate left atrial enlargement and trace mitral regurgitation.  Nuclear study January 2016 showed ejection fraction 58%. There was a lateral infarct but no ischemia.  Since last seen, He had one syncopal episode after standing suddenly. He denies significant dyspnea. He has some fatigue. He does have intermittent chest pain that he attributes to indigestion. It is not exertional. It is unchanged compared to previous evaluation.  Current Outpatient Prescriptions  Medication Sig Dispense Refill  . acetaminophen (TYLENOL) 500 MG tablet Take 500 mg by mouth at bedtime. Take at bedtime every night per patient    . aspirin EC 81 MG EC tablet Take 1 tablet (81 mg total) by mouth daily.    [name] atorvastatin (LIPITOR) 40 MG tablet Take 40 mg by mouth daily at 6 PM.    . budesonide-formoterol (SYMBICORT) 80-4.5 MCG/ACT inhaler Inhale 2 puffs into the lungs 2 (two)  times daily.    . calcium-vitamin D (OSCAL WITH D) 500-200 MG-UNIT per tablet Take 2 tablets by mouth daily.    . carbamazepine (TEGRETOL) 100 MG chewable tablet Chew 300 mg by mouth 2 (two) times daily.    . clopidogrel (PLAVIX) 75 MG tablet Take 1 tablet (75 mg total) by mouth daily.    [name] docusate sodium (COLACE) 100 MG capsule Take 100 mg by mouth daily.    . finasteride (PROSCAR) 5 MG tablet Take 5 mg by mouth daily.     . furosemide (LASIX) 40 MG tablet Take 40 mg by mouth every morning.    [name] ipratropium (ATROVENT HFA) 17 MCG/ACT inhaler Inhale 2 puffs into the lungs 2 (two) times daily as needed for wheezing.     . isosorbide mononitrate (IMDUR) 60 MG 24 hr tablet Take 90 mg by mouth daily.     . metoprolol succinate (TOPROL-XL) 12.5 mg TB24 24 hr tablet Take 12.5 mg by mouth daily.    . Multiple Vitamins-Minerals (MULTIVITAMIN WITH MINERALS) tablet Take 1 tablet by mouth daily.    [name] NITROSTAT 0.4 MG SL tablet PLACE 1 TABLET UNDER THE TONGUE EVERY 5 MINUTES ASDIRECTED BY PHYSICIAN 25 tablet 3  . potassium chloride SA (K-DUR,KLOR-CON) 20 MEQ tablet Take 20 mEq by mouth daily.     . ranitidine (ZANTAC) 150 MG tablet TAKE 1 TABLET BY MOUTH 2 TIMES DAILY. 180 tablet 3  . senna (SENOKOT) 8.6 MG tablet Take 1 tablet by mouth daily as needed for constipation.     [name]  sertraline (ZOLOFT) 100 MG tablet Take 150 mg by mouth as directed. 1 AND 1/2 TABLET BY MOUTH DAILY    . tamsulosin (FLOMAX) 0.4 MG CAPS capsule Take 1 capsule (0.4 mg total) by mouth daily. 30 capsule 0  . temazepam (RESTORIL) 15 MG capsule Take one capsule by mouth every night at bedtime as needed for sleep 30 capsule 5   No current facility-administered medications for this visit.     Past Medical History  Diagnosis Date  . Systolic CHF ([place])        . Dyslipidemia (high LDL; low HDL)        . Peripheral vascular disease (HCC)     thrombus- in leg- many yrs. ago- ?R- coumadin x1 mth  . Peripheral neuropathy ([place])   .  Hiatal hernia   . Depression        . VT (ventricular tachycardia) ([place]) 06/2012    a. in the setting of STEMI b. on amio during hospitalization  . Prostatitis   . Hypertension        . Coronary artery disease     a. s/p CABG 1986 b. redo CABG 1993 c. PTCA 2008 d. DES 2010  . Insomnia        . GERD (gastroesophageal reflux disease)        . Peripheral neuropathy ([place])        . Diverticulosis   . Kidney stones   . Complication of anesthesia     B/P dropped low and had to stay in recovery longer  . Hyperlipemia   . Asthma        . Pneumonia 1960's  . On home oxygen therapy     "2L; just at night" (01/31/2015)  . Anemia 2014    was on iron pills and then was able to come off  . Hepatitis     hepatitis- long time ago, occupational contamination   . Arthritis     "all over"  . Prostate cancer [facility])     radiation therapy- 2002  . Atrial flutter ([place])     a. diagnosed 03/2015    Past Surgical History  Procedure Laterality Date  . Cataract extraction w/ intraocular lens  implant, bilateral    . Fracture surgery      heel- crushed -2002,  (hardware)   . Joint replacement      R great toe  . Rhinoplasty  [id number]  . Lumbar laminectomy/decompression microdiscectomy  08/10/2011    Procedure: LUMBAR LAMINECTOMY/DECOMPRESSION MICRODISCECTOMY;  Surgeon: Cooper Render Pool;  Location: [place] NEURO ORS;  Service: Neurosurgery;  Laterality: Right;  RIGHT Lumbar five-sacral one LAMINECTOMY, MICRODISCECTOMY  . Lumbar wound debridement  08/22/2011    Procedure: LUMBAR WOUND DEBRIDEMENT;  Surgeon: Eustace Moore;  Location: [place] NEURO ORS;  Service: Neurosurgery;  Laterality: N/A;  Repair of CSF Leak requiring laminectomy  . Cystoscopy w/ ureteral stent placement Right 02/28/2013    Procedure: CYSTOSCOPY WITH RETROGRADE PYELOGRAM/URETERAL STENT PLACEMENT;  Surgeon: Dutch Gray, MD;  [facility];  Service: Urology;  Laterality: Right;  . Tonsillectomy  1940  . Appendectomy  1943  . Hammer toe  surgery Right     little toe  . Transurethral resection of prostate  x2  . Foot fusion Left 2000    heel  . Hardware removal Right 2002    great toe; Dr. Sharol Given  . Cystoscopy with ureteroscopy and stent placement Right 03/23/2013    Procedure: RIGHT URETEROSCOPY, LASER LITHO AND STENT PLACEMENT;  Surgeon: Fredricka Bonine, MD;  [facility];  Service: Urology;  Laterality: Right;  . Holmium laser application Right [id number]    Procedure: HOLMIUM LASER APPLICATION;  Surgeon: Fredricka Bonine, MD;  [facility];  Service: Urology;  Laterality: Right;  . Intramedullary (im) nail intertrochanteric Right 08/29/2013    Procedure: INTRAMEDULLARY (IM) NAIL INTERTROCHANTRIC;  Surgeon: Marianna Payment, MD;  Location: [place];  Service: Orthopedics;  Laterality: Right;  . Left and right heart catheterization with coronary angiogram N/A 07/15/2012    Procedure: LEFT AND RIGHT HEART CATHETERIZATION WITH CORONARY ANGIOGRAM;  Surgeon: Jettie Booze, MD;  Location: [facility] CATH LAB;  Service: Cardiovascular;  Laterality: N/A;  . Percutaneous coronary stent intervention (pci-s) N/A 07/15/2012    Procedure: PERCUTANEOUS CORONARY STENT INTERVENTION (PCI-S);  Surgeon: Jettie Booze, MD;  Location: [facility] CATH LAB;  Service: Cardiovascular;  Laterality: N/A;  . Coronary artery bypass graft  1986;  1993    CABG X 5; CABG X 1  . Colonoscopy    . Knee arthroscopy Right X 2  . Inguinal hernia repair Left 01/31/2015    Procedure: OPEN REPAIR RECURRENT LEFT INGUINAL HERNIA;  Surgeon: Fanny Skates, MD;  Location: [place];  Service: General;  Laterality: Left;  . Insertion of mesh Left 01/31/2015    Procedure: INSERTION OF MESH;  Surgeon: Fanny Skates, MD;  Location: [place];  Service: General;  Laterality: Left;  . Ep implantable device N/A 04/11/2015    Procedure: Pacemaker Implant;  Surgeon: Will Meredith Leeds, MD;  Location: [place] CV LAB;  Service: Cardiovascular;  Laterality: N/A;     Social History   Social History  . Marital Status: Married    Spouse Name: N/A  . Number of Children: N/A  . Years of Education: N/A   Occupational History  . Not on file.   Social History Main Topics  . Smoking status: Never Smoker   . Smokeless tobacco: Never Used  . Alcohol Use: No  . Drug Use: No  . Sexual Activity: Not on file   Other Topics Concern  . Not on file   Social History Narrative    Family History  Problem Relation Age of Onset  . Heart attack Mother   . Addison's disease Father   . Heart attack Sister   . Anesthesia problems Neg Hx   . Hypotension Neg Hx   . Malignant hyperthermia Neg Hx   . Pseudochol deficiency Neg Hx     ROS: Bilateral feet pain but no fevers or chills, productive cough, hemoptysis, dysphasia, odynophagia, melena, hematochezia, dysuria, hematuria, rash, seizure activity, orthopnea, PND, pedal edema, claudication. Remaining systems are negative.  Physical Exam: Well-developed well-nourished in no acute distress.  Skin is warm and dry.  HEENT is normal.  Neck is supple.  Chest is clear to auscultation with normal expansion.  Cardiovascular exam is regular rate and rhythm.  Abdominal exam nontender or distended. No masses palpated. Extremities show no edema. Right foot in brace. neuro grossly intact

## 2015-12-25 ENCOUNTER — Encounter: Payer: Self-pay | Admitting: Cardiology

## 2015-12-25 ENCOUNTER — Ambulatory Visit (INDEPENDENT_AMBULATORY_CARE_PROVIDER_SITE_OTHER): Payer: Medicare Other | Admitting: Cardiology

## 2015-12-25 VITALS — BP 144/76 | HR 88 | Ht 70.0 in | Wt 163.0 lb

## 2015-12-25 DIAGNOSIS — I10 Essential (primary) hypertension: Secondary | ICD-10-CM | POA: Diagnosis not present

## 2015-12-25 DIAGNOSIS — I951 Orthostatic hypotension: Secondary | ICD-10-CM

## 2015-12-25 DIAGNOSIS — Z950 Presence of cardiac pacemaker: Secondary | ICD-10-CM

## 2015-12-25 DIAGNOSIS — I4892 Unspecified atrial flutter: Secondary | ICD-10-CM | POA: Diagnosis not present

## 2015-12-25 NOTE — Assessment & Plan Note (Signed)
Continue statin. 

## 2015-12-25 NOTE — Assessment & Plan Note (Addendum)
We will need to follow and possibly reduce antianginal's if symptoms worsen.

## 2015-12-25 NOTE — Assessment & Plan Note (Signed)
Patient apparently with history of atrial flutter. He is not on anticoagulation because of history of falls. Continue beta blocker.

## 2015-12-25 NOTE — Assessment & Plan Note (Addendum)
Continue aspirin, Plavix and statin. Patient is having chest pain which he feels is indigestion. He has had these symptoms long-term. They are unchanged compared to previous evaluation with Dr. Mare Ferrari. We discussed repeating a nuclear study for risk stratification but he declined and would prefer medical therapy at this point. I think this is reasonable. We will reconsider if symptoms change.

## 2015-12-25 NOTE — Assessment & Plan Note (Signed)
Patient had a recent syncopal episode likely orthostatic mediated. We will not advance blood pressure medications. This is a long-standing issue.

## 2015-12-25 NOTE — Assessment & Plan Note (Signed)
Continue present blood pressure medications. Given history of orthostasis we will not be aggressive in controlling blood pressure.

## 2015-12-25 NOTE — Patient Instructions (Signed)
Your physician wants you to follow-up in: 4 MONTHS WITH DR CRENSHAW You will receive a reminder letter in the mail two months in advance. If you don't receive a letter, please call our office to schedule the follow-up appointment.   If you need a refill on your cardiac medications before your next appointment, please call your pharmacy.  

## 2015-12-25 NOTE — Assessment & Plan Note (Signed)
Followed by electrophysiology. 

## 2015-12-30 ENCOUNTER — Other Ambulatory Visit: Payer: Self-pay | Admitting: Student

## 2015-12-30 ENCOUNTER — Observation Stay (HOSPITAL_COMMUNITY)
Admission: EM | Admit: 2015-12-30 | Discharge: 2015-12-30 | Disposition: A | Discharge status: Home / Self Care | Payer: Medicare Other | Source: Home / Self Care | Attending: Emergency Medicine | Admitting: Emergency Medicine

## 2015-12-30 ENCOUNTER — Encounter (HOSPITAL_COMMUNITY): Payer: Self-pay | Admitting: Emergency Medicine

## 2015-12-30 ENCOUNTER — Emergency Department (HOSPITAL_COMMUNITY): Payer: Medicare Other

## 2015-12-30 DIAGNOSIS — J45909 Unspecified asthma, uncomplicated: Secondary | ICD-10-CM | POA: Insufficient documentation

## 2015-12-30 DIAGNOSIS — R05 Cough: Secondary | ICD-10-CM

## 2015-12-30 DIAGNOSIS — I5022 Chronic systolic (congestive) heart failure: Secondary | ICD-10-CM

## 2015-12-30 DIAGNOSIS — E785 Hyperlipidemia, unspecified: Secondary | ICD-10-CM | POA: Insufficient documentation

## 2015-12-30 DIAGNOSIS — I110 Hypertensive heart disease with heart failure: Secondary | ICD-10-CM | POA: Insufficient documentation

## 2015-12-30 DIAGNOSIS — J449 Chronic obstructive pulmonary disease, unspecified: Secondary | ICD-10-CM | POA: Insufficient documentation

## 2015-12-30 DIAGNOSIS — Z955 Presence of coronary angioplasty implant and graft: Secondary | ICD-10-CM | POA: Insufficient documentation

## 2015-12-30 DIAGNOSIS — J040 Acute laryngitis: Secondary | ICD-10-CM

## 2015-12-30 DIAGNOSIS — J441 Chronic obstructive pulmonary disease with (acute) exacerbation: Secondary | ICD-10-CM | POA: Diagnosis not present

## 2015-12-30 DIAGNOSIS — Z7982 Long term (current) use of aspirin: Secondary | ICD-10-CM

## 2015-12-30 DIAGNOSIS — Z7902 Long term (current) use of antithrombotics/antiplatelets: Secondary | ICD-10-CM | POA: Insufficient documentation

## 2015-12-30 DIAGNOSIS — Z8546 Personal history of malignant neoplasm of prostate: Secondary | ICD-10-CM

## 2015-12-30 DIAGNOSIS — Z923 Personal history of irradiation: Secondary | ICD-10-CM

## 2015-12-30 DIAGNOSIS — Z79899 Other long term (current) drug therapy: Secondary | ICD-10-CM

## 2015-12-30 DIAGNOSIS — Z9981 Dependence on supplemental oxygen: Secondary | ICD-10-CM

## 2015-12-30 DIAGNOSIS — J45901 Unspecified asthma with (acute) exacerbation: Secondary | ICD-10-CM

## 2015-12-30 DIAGNOSIS — I252 Old myocardial infarction: Secondary | ICD-10-CM

## 2015-12-30 DIAGNOSIS — I2510 Atherosclerotic heart disease of native coronary artery without angina pectoris: Secondary | ICD-10-CM | POA: Insufficient documentation

## 2015-12-30 DIAGNOSIS — R0602 Shortness of breath: Secondary | ICD-10-CM | POA: Diagnosis present

## 2015-12-30 DIAGNOSIS — R059 Cough, unspecified: Secondary | ICD-10-CM

## 2015-12-30 LAB — BASIC METABOLIC PANEL
ANION GAP: 10 (ref 5–15)
BUN: 13 mg/dL (ref 6–20)
CALCIUM: 9.7 mg/dL (ref 8.9–10.3)
CO2: 24 mmol/L (ref 22–32)
Chloride: 100 mmol/L — ABNORMAL LOW (ref 101–111)
Creatinine, Ser: 1.12 mg/dL (ref 0.61–1.24)
GFR, EST NON AFRICAN AMERICAN: 57 mL/min — AB (ref >60)
GLUCOSE: 146 mg/dL — AB (ref 65–99)
Potassium: 4.3 mmol/L (ref 3.5–5.1)
Sodium: 134 mmol/L — ABNORMAL LOW (ref 135–145)

## 2015-12-30 LAB — CBC
HCT: 36.2 % — ABNORMAL LOW (ref 39.0–52.0)
HEMOGLOBIN: 12.1 g/dL — AB (ref 13.0–17.0)
MCH: 32.4 pg (ref 26.0–34.0)
MCHC: 33.4 g/dL (ref 30.0–36.0)
MCV: 96.8 fL (ref 78.0–100.0)
PLATELETS: 216 10*3/uL (ref 150–400)
RBC: 3.74 MIL/uL — ABNORMAL LOW (ref 4.22–5.81)
RDW: 14 % (ref 11.5–15.5)
WBC: 7.5 10*3/uL (ref 4.0–10.5)

## 2015-12-30 LAB — I-STAT TROPONIN, ED: TROPONIN I, POC: 0.01 ng/mL (ref 0.00–0.08)

## 2015-12-30 MED ORDER — ALBUTEROL (5 MG/ML) CONTINUOUS INHALATION SOLN
10.0000 mg/h | INHALATION_SOLUTION | RESPIRATORY_TRACT | Status: AC
  Administered 2015-12-30: 10 mg/h via RESPIRATORY_TRACT
  Filled 2015-12-30: qty 20

## 2015-12-30 MED ORDER — BENZONATATE 100 MG PO CAPS: 100.0000 mg | ORAL_CAPSULE | Freq: Two times a day (BID) | ORAL | Status: DC | PRN

## 2015-12-30 MED ORDER — DOXYCYCLINE HYCLATE 100 MG PO TABS: 100.0000 mg | ORAL_TABLET | Freq: Two times a day (BID) | ORAL | Status: DC

## 2015-12-30 MED ORDER — ALBUTEROL SULFATE (2.5 MG/3ML) 0.083% IN NEBU
INHALATION_SOLUTION | RESPIRATORY_TRACT | Status: AC
  Filled 2015-12-30: qty 6

## 2015-12-30 MED ORDER — PREDNISONE 20 MG PO TABS
60.0000 mg | ORAL_TABLET | Freq: Once | ORAL | Status: AC
  Administered 2015-12-30: 60 mg via ORAL
  Filled 2015-12-30: qty 3

## 2015-12-30 MED ORDER — ALBUTEROL SULFATE (2.5 MG/3ML) 0.083% IN NEBU
5.0000 mg | INHALATION_SOLUTION | Freq: Once | RESPIRATORY_TRACT | Status: AC
  Administered 2015-12-30: 5 mg via RESPIRATORY_TRACT

## 2015-12-30 MED ORDER — PREDNISONE 50 MG PO TABS: 50.0000 mg | ORAL_TABLET | Freq: Every day | ORAL | Status: DC

## 2015-12-30 MED ORDER — ALBUTEROL SULFATE HFA 108 (90 BASE) MCG/ACT IN AERS: 2.0000 | INHALATION_SPRAY | Freq: Four times a day (QID) | RESPIRATORY_TRACT | Status: AC | PRN

## 2015-12-30 NOTE — ED Provider Notes (Signed)
CSN: [id number]     Arrival date & time 12/30/15  1103 History   First MD Initiated Contact with Patient 12/30/15 1132     Chief Complaint  Patient presents with  . Shortness of Breath     (Consider location/radiation/quality/duration/timing/severity/associated sxs/prior Treatment) HPI Patient presents to the emergency department with shortness of breath with a one-week history of cough.  Patient states has history of asthma and he feels that his inhalers have not helped his symptoms.  Her last 24 hours.  Patient states that he did not take any over-the-counter medications.  Patient states nothing sees condition better, but exertion seems to make his shortness of breath, worse.The patient denies chest pain,  headache,blurred vision, neck pain, fever, cough, weakness, numbness, dizziness, anorexia, edema, abdominal pain, nausea, vomiting, diarrhea, rash, back pain, dysuria, hematemesis, bloody stool, near syncope, or syncope. Past Medical History  Diagnosis Date  . Systolic CHF ([place])        . Dyslipidemia (high LDL; low HDL)        . Peripheral vascular disease ([facility])     thrombus- in leg- many yrs. ago- ?R- coumadin x1 mth  . Peripheral neuropathy ([place])   . Hiatal hernia   . Depression        . VT (ventricular tachycardia) ([place]) 06/2012    a. in the setting of STEMI b. on amio during hospitalization  . Prostatitis   . Hypertension        . Coronary artery disease     a. s/p CABG 1986 b. redo CABG 1993 c. PTCA 2008 d. DES 2010  . Insomnia        . GERD (gastroesophageal reflux disease)        . Peripheral neuropathy ([place])        . Diverticulosis   . Kidney stones   . Complication of anesthesia     B/P dropped low and had to stay in recovery longer  . Hyperlipemia   . Asthma        . Pneumonia 1960's  . On home oxygen therapy     "2L; just at night" (01/31/2015)  . Anemia 2014    was on iron pills and then was able to come off  . Hepatitis     hepatitis- long time ago,  occupational contamination   . Arthritis     "all over"  . Prostate cancer [facility])     radiation therapy- 2002  . Atrial flutter ([place])     a. diagnosed 03/2015   Past Surgical History  Procedure Laterality Date  . Cataract extraction w/ intraocular lens  implant, bilateral    . Fracture surgery      heel- crushed -2002,  (hardware)   . Joint replacement      R great toe  . Rhinoplasty  [id number]  . Lumbar laminectomy/decompression microdiscectomy  08/10/2011    Procedure: LUMBAR LAMINECTOMY/DECOMPRESSION MICRODISCECTOMY;  Surgeon: Cooper Render Pool;  Location: [place] NEURO ORS;  Service: Neurosurgery;  Laterality: Right;  RIGHT Lumbar five-sacral one LAMINECTOMY, MICRODISCECTOMY  . Lumbar wound debridement  08/22/2011    Procedure: LUMBAR WOUND DEBRIDEMENT;  Surgeon: Eustace Moore;  Location: [place] NEURO ORS;  Service: Neurosurgery;  Laterality: N/A;  Repair of CSF Leak requiring laminectomy  . Cystoscopy w/ ureteral stent placement Right 02/28/2013    Procedure: CYSTOSCOPY WITH RETROGRADE PYELOGRAM/URETERAL STENT PLACEMENT;  Surgeon: Dutch Gray, MD;  Location: [facility] ORS;  Service: Urology;  Laterality: Right;  . Tonsillectomy  1940  .  Appendectomy  1943  . Hammer toe surgery Right     little toe  . Transurethral resection of prostate  x2  . Foot fusion Left 2000    heel  . Hardware removal Right 2002    great toe; Dr. Sharol Given  . Cystoscopy with ureteroscopy and stent placement Right 03/23/2013    Procedure: RIGHT URETEROSCOPY, LASER LITHO AND STENT PLACEMENT;  Surgeon: Fredricka Bonine, MD;  Location: [facility] ORS;  Service: Urology;  Laterality: Right;  . Holmium laser application Right [id number]    Procedure: HOLMIUM LASER APPLICATION;  Surgeon: Fredricka Bonine, MD;  Location: [facility] ORS;  Service: Urology;  Laterality: Right;  . Intramedullary (im) nail intertrochanteric Right 08/29/2013    Procedure: INTRAMEDULLARY (IM) NAIL INTERTROCHANTRIC;  Surgeon: Marianna Payment, MD;  Location: [place];   Service: Orthopedics;  Laterality: Right;  . Left and right heart catheterization with coronary angiogram N/A 07/15/2012    Procedure: LEFT AND RIGHT HEART CATHETERIZATION WITH CORONARY ANGIOGRAM;  Surgeon: Jettie Booze, MD;  Location: [facility] CATH LAB;  Service: Cardiovascular;  Laterality: N/A;  . Percutaneous coronary stent intervention (pci-s) N/A 07/15/2012    Procedure: PERCUTANEOUS CORONARY STENT INTERVENTION (PCI-S);  Surgeon: Jettie Booze, MD;  Location: [facility] CATH LAB;  Service: Cardiovascular;  Laterality: N/A;  . Coronary artery bypass graft  1986;  1993    CABG X 5; CABG X 1  . Colonoscopy    . Knee arthroscopy Right X 2  . Inguinal hernia repair Left 01/31/2015    Procedure: OPEN REPAIR RECURRENT LEFT INGUINAL HERNIA;  Surgeon: Fanny Skates, MD;  Location: [place];  Service: General;  Laterality: Left;  . Insertion of mesh Left 01/31/2015    Procedure: INSERTION OF MESH;  Surgeon: Fanny Skates, MD;  Location: [place];  Service: General;  Laterality: Left;  . Ep implantable device N/A 04/11/2015    Procedure: Pacemaker Implant;  Surgeon: Will Meredith Leeds, MD;  Location: [place] CV LAB;  Service: Cardiovascular;  Laterality: N/A;   Family History  Problem Relation Age of Onset  . Heart attack Mother   . Addison's disease Father   . Heart attack Sister   . Anesthesia problems Neg Hx   . Hypotension Neg Hx   . Malignant hyperthermia Neg Hx   . Pseudochol deficiency Neg Hx    Social History  Substance Use Topics  . Smoking status: Never Smoker   . Smokeless tobacco: Never Used  . Alcohol Use: No    Review of Systems  All other systems negative except as documented in the HPI. All pertinent positives and negatives as reviewed in the HPI.  Allergies  Review of patient's allergies indicates no known allergies.  Home Medications   Prior to Admission medications   Medication Sig Start Date End Date Taking? Authorizing Provider  acetaminophen (TYLENOL) 500 MG  tablet Take 500 mg by mouth at bedtime. Take at bedtime every night per patient   Yes Historical Provider, MD  aspirin EC 81 MG EC tablet Take 1 tablet (81 mg total) by mouth daily. 04/12/15  Yes Luke K Kilroy, PA-C  atorvastatin (LIPITOR) 40 MG tablet Take 40 mg by mouth daily at 6 PM.   Yes Historical Provider, MD  budesonide-formoterol (SYMBICORT) 80-4.5 MCG/ACT inhaler Inhale 2 puffs into the lungs 2 (two) times daily. 09/12/12  Yes Tanda Rockers, MD  calcium-vitamin D (OSCAL WITH D) 500-200 MG-UNIT per tablet Take 2 tablets by mouth daily.   Yes Historical Provider, MD  carbamazepine (TEGRETOL) 100 MG chewable tablet Chew 300 mg by mouth 2 (two) times daily.   Yes Historical Provider, MD  clopidogrel (PLAVIX) 75 MG tablet Take 1 tablet (75 mg total) by mouth daily. 04/12/15  Yes Luke K Kilroy, PA-C  docusate sodium (COLACE) 100 MG capsule Take 100 mg by mouth daily.   Yes Historical Provider, MD  finasteride (PROSCAR) 5 MG tablet Take 5 mg by mouth daily.    Yes Historical Provider, MD  furosemide (LASIX) 40 MG tablet Take 40 mg by mouth every morning.   Yes Historical Provider, MD  ipratropium (ATROVENT HFA) 17 MCG/ACT inhaler Inhale 2 puffs into the lungs 2 (two) times daily as needed for wheezing.    Yes Historical Provider, MD  isosorbide mononitrate (IMDUR) 60 MG 24 hr tablet Take 90 mg by mouth daily.    Yes Historical Provider, MD  metoprolol succinate (TOPROL-XL) 12.5 mg TB24 24 hr tablet Take 12.5 mg by mouth daily.   Yes Historical Provider, MD  Multiple Vitamins-Minerals (MULTIVITAMIN WITH MINERALS) tablet Take 1 tablet by mouth daily.   Yes Historical Provider, MD  NITROSTAT 0.4 MG SL tablet PLACE 1 TABLET UNDER THE TONGUE EVERY 5 MINUTES ASDIRECTED BY PHYSICIAN 01/07/15  Yes Darlin Coco, MD  potassium chloride SA (K-DUR,KLOR-CON) 20 MEQ tablet Take 20 mEq by mouth daily.    Yes Historical Provider, MD  ranitidine (ZANTAC) 150 MG tablet TAKE 1 TABLET BY MOUTH 2 TIMES DAILY. 10/29/15   Yes Darlin Coco, MD  senna (SENOKOT) 8.6 MG tablet Take 1 tablet by mouth daily as needed for constipation.    Yes Historical Provider, MD  sertraline (ZOLOFT) 100 MG tablet Take 150 mg by mouth as directed. 1 AND 1/2 TABLET BY MOUTH DAILY   Yes Historical Provider, MD  tamsulosin (FLOMAX) 0.4 MG CAPS capsule Take 1 capsule (0.4 mg total) by mouth daily. 09/06/14  Yes Reynolds N Rumley, DO  temazepam (RESTORIL) 15 MG capsule Take one capsule by mouth every night at bedtime as needed for sleep 07/20/15  Yes Darlin Coco, MD   BP 149/70 mmHg  Pulse 91  Temp(Src) 97.8 F (36.6 C) (Oral)  Resp 22  SpO2 97% Physical Exam  Constitutional: He is oriented to person, place, and time. He appears well-developed and well-nourished. No distress.  HENT:  Head: Normocephalic and atraumatic.  Mouth/Throat: Oropharynx is clear and moist.  Eyes: Pupils are equal, round, and reactive to light.  Neck: Normal range of motion. Neck supple.  Cardiovascular: Normal rate, regular rhythm and normal heart sounds.  Exam reveals no gallop and no friction rub.   No murmur heard. Pulmonary/Chest: Effort normal. Tachypnea noted. No respiratory distress. He has wheezes. He has rhonchi.  Abdominal: Soft. Bowel sounds are normal. He exhibits no distension. There is no tenderness.  Neurological: He is alert and oriented to person, place, and time. He exhibits normal muscle tone. Coordination normal.  Skin: Skin is warm and dry. No rash noted. No erythema.  Psychiatric: He has a normal mood and affect. His behavior is normal.  Nursing note and vitals reviewed.   ED Course  Procedures (including critical care time) Labs Review Labs Reviewed  BASIC METABOLIC PANEL - Abnormal; Notable for the following:    Sodium 134 (*)    Chloride 100 (*)    Glucose, Bld 146 (*)    GFR calc non Af Amer 57 (*)    All other components within normal limits  CBC - Abnormal; Notable for the following:  RBC 3.74 (*)     Hemoglobin 12.1 (*)    HCT 36.2 (*)    All other components within normal limits  Randolm Idol, ED    Imaging Review Dg Chest 2 View  12/30/2015  CLINICAL DATA:  Laryngitis, sore rib.  Coughing, fever EXAM: CHEST  2 VIEW COMPARISON:  04/12/2015 FINDINGS: There is no focal parenchymal opacity. There is no pleural effusion or pneumothorax. The heart and mediastinal contours are unremarkable. There is a dual lead AICD. There is evidence of prior CABG. The osseous structures are unremarkable. IMPRESSION: No active cardiopulmonary disease. Electronically Signed   By: Kathreen Devoid   On: 12/30/2015 12:30   I have personally reviewed and evaluated these images and lab results as part of my medical decision-making.   EKG Interpretation   Date/Time:  Tuesday Dec 30 2015 11:10:42 EDT Ventricular Rate:  75 PR Interval:  148 QRS Duration: 172 QT Interval:  422 QTC Calculation: 471 R Axis:   94 Text Interpretation:  ATRIAL PACED RHYTHM Normal sinus rhythm Rightward  axis Left bundle branch block Abnormal ECG Confirmed by Alvino Chapel  MD,  Ovid Curd [phone]) on 12/30/2015 11:12:43 AM Also confirmed by Alvino Chapel  MD,  NATHAN [phone]), editor Rolla Plate, Joelene Millin [phone])  on 12/30/2015 11:40:00 AM      MDM   Final diagnoses:  Shortness of breath   I called to have the patient admitted.  The family practice residents came down and advised that they would like to discharge him home because he does have oxygen.  He can use at home and they will follow up with him closely in the next 1-2 days.  The attending came and saw the patient and felt he needed to be admitted but the patient declined admission, based on the fact that he was anticipating discharge home.  I advised patient to return here for any worsening in his condition    Dalia Heading, PA-C 12/31/15 0700  Davonna Belling, MD 12/31/15 [phone]

## 2015-12-30 NOTE — ED Notes (Signed)
Pt reports that he started feeling like he was getting laryngitis 2 days ago and it has progressed to a really bad cough with SOB. Pt alert x4. NAD at this time.

## 2015-12-30 NOTE — ED Notes (Signed)
MD at bedside. 

## 2015-12-30 NOTE — ED Notes (Signed)
PA Lawyer at bedside.  

## 2015-12-30 NOTE — ED Notes (Signed)
Trude Mcburney, PA at bedside.

## 2015-12-30 NOTE — ED Notes (Signed)
Pt's O2 sat dropped to 89% and pt's respirations appeared more labored while standing at bedside to use urinal. PA Irena Cords made aware.

## 2015-12-30 NOTE — ED Notes (Signed)
RRT at bedside to place patient on continous neb treatment

## 2015-12-30 NOTE — ED Notes (Signed)
Patient transported to X-ray 

## 2015-12-30 NOTE — ED Notes (Signed)
This RN spoke with Dr. [name] who advised patient has decided to go home and not be admitted. 6E and bed control made aware that patient will not be going to room he was assigned.

## 2015-12-30 NOTE — Consult Note (Signed)
[facility] Admission History and Physical Service Pager: [phone]  Patient name: [name] Medical record number: [id number] Date of birth: 04-05-1925 Age: 82 y.o. Gender: male  Primary Care Provider: Paula Compton, MD  Chief Complaint: shortness of breath and cough for two days  Assessment and Plan: [name] is a 82 y.o. male with history of asthma/COPD on 2L at home, CAD s/p CABG and stent placement, CHF presenting to ED with worsening dyspnea and cough. Patient with productive cough, dyspnea, mild fever for two days suggestive for asthma/COPD exacerabtion. Vital signs with normal range in ED. Oxygen saturation 91-100% at rest. His saturation dropped to 89% with ambulation on room air. However, patient on 2L at home. His lung exam remarkable for end-expiratory wheezes over lower lung fields. No increased work of breathing. No sign of fluid overload to suggest CHF exacerbation. Doubt PE with the oxygen saturation he has and without tachycardia and tachypnea. He is on 2L at home as well. CXR unremarkable. EKG unchanged from baseline and troponin negative to think of ACS either.   Patient didn't have PFT in chart. He was followed by Dr. Melvyn Novas in the past. It is not clear if he has asthma or COPD at baseline. He has no history of smoking. He has not diaphragmatic flattening on CXR. Mild - Moderate Asthma Exacerbation. Some mucus production. No acute indication for hospitalization without hypoxia, and patient requesting to go home and close follow up in clinic.   - albuterol, symbicort to continue - prednisone burst for 5 days - doxycyline 100 mg twice a day x 5 days.  - We have arranged his follow up at out clinic on 01/01/2016 at 1:45 pm. - Reviewed return precautions with the patient including worsening SOB, or hypoxia.  - Instructed to use his oxygen day and night for the next 5 days at 2L.   History of Present Illness:  [name] is a 82 y.o. male  presenting with shortness of breath and cough  Two days ago he had dyspnea on cough with whitish to grayish phlegm in his throat. He also had a fever to 101.4. He went to bed okay. On Monday he had worsening of cough. He also reports feeling bad overall. He had temperature of 100 to 101. He cannot sleep because of cough. He reports having left subcostal pain from coughing a lot. He also reports having similar pain in the past for his hiatal hernia.  Patient reports using his albuterol and Symbicort at home. He reports minimal response with albuterol. So he decided to come to ED for evaluation.  Patient is on 2L oxygen at home. He didn't use his oxygen prior to coming to ED. He denies nausea, emesis, diaphoresis, diarrhea, cold like symptoms, dysuria or leg swelling.  He reports taking all his medication. He denies smoking or drinking alcohol. In ED, patient with normal vital signs. Oxygen saturation ranging from 91 to 100% on room air.  He received albuterol and prednisone. However, his saturation dropped to 89% with ambulation on room air. Point of care troponin negative. EKG with no change from his old EKG. CBC and BMP within normal limit. CXR without cardiopulmonary process.   Review Of Systems: Per HPI Otherwise the remainder of the systems were negative.  Allergies and Medications: No Known Allergies  Objective: BP 147/64 mmHg  Pulse 90  Temp(Src) 97.8 F (36.6 C) (Oral)  Resp 22  SpO2 93% Exam: GEN: elderly, sitting on edge of  the bed, NAD Oropharynx: clear, moist CVS: regular rate and rythm, normal s1 and s2, no murmurs, no edema of his extremities.  RESP: no increased work of breathing, no crackles. Has end-expiratory wheezes GI: normal bowel sound, soft, non-tender,non-distended NEURO: alert and oriented x4, no gross defecits  PSYCH: good affect Labs and Imaging: CBC BMET   Recent Labs Lab 12/30/15 1118  WBC 7.5  HGB 12.1*  HCT 36.2*  PLT 216    Recent Labs Lab  12/30/15 1118  NA 134*  K 4.3  CL 100*  CO2 24  BUN 13  CREATININE 1.12  GLUCOSE 146*  CALCIUM 9.7    EKG: unchanged from baseline CXR: negative for cardiopulmonary process POCT trop negative.  Mercy Riding, MD 12/30/2015, 2:43 PM PGY-1, [place] [facility] pager: [phone], text pages welcome  I agree with the above evaluation, assessment, and plan. Any correctional changes can be noted in [facility].   Aquilla Hacker, MD Family Medicine Resident - PGY 2

## 2015-12-30 NOTE — Discharge Instructions (Signed)
Follow-up with your doctor in their office in the next 1-2 days

## 2016-01-01 ENCOUNTER — Other Ambulatory Visit: Payer: Self-pay

## 2016-01-01 ENCOUNTER — Ambulatory Visit (INDEPENDENT_AMBULATORY_CARE_PROVIDER_SITE_OTHER): Payer: Medicare Other | Admitting: Family Medicine

## 2016-01-01 ENCOUNTER — Inpatient Hospital Stay (HOSPITAL_COMMUNITY)
Admission: AD | Admit: 2016-01-01 | Discharge: 2016-01-04 | DRG: 191 | Disposition: A | Discharge status: Home / Self Care | Payer: Medicare Other | Source: Ambulatory Visit | Attending: Family Medicine | Admitting: Family Medicine

## 2016-01-01 ENCOUNTER — Encounter (HOSPITAL_COMMUNITY): Payer: Self-pay | Admitting: Nurse Practitioner

## 2016-01-01 ENCOUNTER — Inpatient Hospital Stay (HOSPITAL_COMMUNITY): Payer: Medicare Other

## 2016-01-01 VITALS — BP 135/68 | HR 85 | Temp 97.5°F | Wt 162.4 lb

## 2016-01-01 DIAGNOSIS — R05 Cough: Secondary | ICD-10-CM | POA: Diagnosis present

## 2016-01-01 DIAGNOSIS — Z8546 Personal history of malignant neoplasm of prostate: Secondary | ICD-10-CM | POA: Diagnosis not present

## 2016-01-01 DIAGNOSIS — G629 Polyneuropathy, unspecified: Secondary | ICD-10-CM | POA: Diagnosis present

## 2016-01-01 DIAGNOSIS — M1990 Unspecified osteoarthritis, unspecified site: Secondary | ICD-10-CM | POA: Diagnosis present

## 2016-01-01 DIAGNOSIS — G479 Sleep disorder, unspecified: Secondary | ICD-10-CM | POA: Diagnosis present

## 2016-01-01 DIAGNOSIS — Z9842 Cataract extraction status, left eye: Secondary | ICD-10-CM

## 2016-01-01 DIAGNOSIS — Z96698 Presence of other orthopedic joint implants: Secondary | ICD-10-CM | POA: Diagnosis present

## 2016-01-01 DIAGNOSIS — F329 Major depressive disorder, single episode, unspecified: Secondary | ICD-10-CM | POA: Diagnosis present

## 2016-01-01 DIAGNOSIS — Z7982 Long term (current) use of aspirin: Secondary | ICD-10-CM

## 2016-01-01 DIAGNOSIS — R338 Other retention of urine: Secondary | ICD-10-CM | POA: Diagnosis present

## 2016-01-01 DIAGNOSIS — N401 Enlarged prostate with lower urinary tract symptoms: Secondary | ICD-10-CM | POA: Diagnosis present

## 2016-01-01 DIAGNOSIS — I2510 Atherosclerotic heart disease of native coronary artery without angina pectoris: Secondary | ICD-10-CM | POA: Diagnosis present

## 2016-01-01 DIAGNOSIS — Z7902 Long term (current) use of antithrombotics/antiplatelets: Secondary | ICD-10-CM | POA: Diagnosis not present

## 2016-01-01 DIAGNOSIS — L409 Psoriasis, unspecified: Secondary | ICD-10-CM | POA: Diagnosis present

## 2016-01-01 DIAGNOSIS — J449 Chronic obstructive pulmonary disease, unspecified: Secondary | ICD-10-CM | POA: Diagnosis not present

## 2016-01-01 DIAGNOSIS — I110 Hypertensive heart disease with heart failure: Secondary | ICD-10-CM | POA: Diagnosis present

## 2016-01-01 DIAGNOSIS — R0602 Shortness of breath: Secondary | ICD-10-CM

## 2016-01-01 DIAGNOSIS — Z7952 Long term (current) use of systemic steroids: Secondary | ICD-10-CM

## 2016-01-01 DIAGNOSIS — I248 Other forms of acute ischemic heart disease: Secondary | ICD-10-CM | POA: Diagnosis present

## 2016-01-01 DIAGNOSIS — Z951 Presence of aortocoronary bypass graft: Secondary | ICD-10-CM

## 2016-01-01 DIAGNOSIS — I5020 Unspecified systolic (congestive) heart failure: Secondary | ICD-10-CM | POA: Diagnosis present

## 2016-01-01 DIAGNOSIS — Z950 Presence of cardiac pacemaker: Secondary | ICD-10-CM

## 2016-01-01 DIAGNOSIS — J441 Chronic obstructive pulmonary disease with (acute) exacerbation: Secondary | ICD-10-CM | POA: Diagnosis present

## 2016-01-01 DIAGNOSIS — I739 Peripheral vascular disease, unspecified: Secondary | ICD-10-CM | POA: Diagnosis present

## 2016-01-01 DIAGNOSIS — J4541 Moderate persistent asthma with (acute) exacerbation: Secondary | ICD-10-CM | POA: Diagnosis not present

## 2016-01-01 DIAGNOSIS — E785 Hyperlipidemia, unspecified: Secondary | ICD-10-CM | POA: Diagnosis present

## 2016-01-01 DIAGNOSIS — K219 Gastro-esophageal reflux disease without esophagitis: Secondary | ICD-10-CM | POA: Diagnosis present

## 2016-01-01 DIAGNOSIS — R0600 Dyspnea, unspecified: Secondary | ICD-10-CM

## 2016-01-01 DIAGNOSIS — I5022 Chronic systolic (congestive) heart failure: Secondary | ICD-10-CM | POA: Diagnosis present

## 2016-01-01 DIAGNOSIS — I10 Essential (primary) hypertension: Secondary | ICD-10-CM | POA: Diagnosis present

## 2016-01-01 DIAGNOSIS — I447 Left bundle-branch block, unspecified: Secondary | ICD-10-CM | POA: Diagnosis present

## 2016-01-01 DIAGNOSIS — Z9841 Cataract extraction status, right eye: Secondary | ICD-10-CM | POA: Diagnosis not present

## 2016-01-01 DIAGNOSIS — J45909 Unspecified asthma, uncomplicated: Secondary | ICD-10-CM | POA: Diagnosis present

## 2016-01-01 DIAGNOSIS — J45901 Unspecified asthma with (acute) exacerbation: Secondary | ICD-10-CM | POA: Diagnosis not present

## 2016-01-01 DIAGNOSIS — Z923 Personal history of irradiation: Secondary | ICD-10-CM

## 2016-01-01 DIAGNOSIS — I252 Old myocardial infarction: Secondary | ICD-10-CM | POA: Diagnosis not present

## 2016-01-01 DIAGNOSIS — Z961 Presence of intraocular lens: Secondary | ICD-10-CM | POA: Diagnosis present

## 2016-01-01 LAB — COMPREHENSIVE METABOLIC PANEL
ALK PHOS: 69 U/L (ref 38–126)
ALT: 22 U/L (ref 17–63)
ANION GAP: 13 (ref 5–15)
AST: 45 U/L — ABNORMAL HIGH (ref 15–41)
Albumin: 3.1 g/dL — ABNORMAL LOW (ref 3.5–5.0)
BILIRUBIN TOTAL: 0.4 mg/dL (ref 0.3–1.2)
BUN: 16 mg/dL (ref 6–20)
CALCIUM: 9.9 mg/dL (ref 8.9–10.3)
CO2: 24 mmol/L (ref 22–32)
Chloride: 100 mmol/L — ABNORMAL LOW (ref 101–111)
Creatinine, Ser: 0.97 mg/dL (ref 0.61–1.24)
GFR calc non Af Amer: >60 mL/min (ref >60)
Glucose, Bld: 148 mg/dL — ABNORMAL HIGH (ref 65–99)
POTASSIUM: 5.1 mmol/L (ref 3.5–5.1)
SODIUM: 137 mmol/L (ref 135–145)
TOTAL PROTEIN: 6.7 g/dL (ref 6.5–8.1)

## 2016-01-01 LAB — CBC WITH DIFFERENTIAL/PLATELET
BASOS ABS: 0 10*3/uL (ref 0.0–0.1)
Basophils Relative: 0 %
Eosinophils Absolute: 0 10*3/uL (ref 0.0–0.7)
Eosinophils Relative: 0 %
HEMATOCRIT: 33.5 % — AB (ref 39.0–52.0)
HEMOGLOBIN: 10.7 g/dL — AB (ref 13.0–17.0)
LYMPHS PCT: 6 %
Lymphs Abs: 0.6 10*3/uL — ABNORMAL LOW (ref 0.7–4.0)
MCH: 31.3 pg (ref 26.0–34.0)
MCHC: 31.9 g/dL (ref 30.0–36.0)
MCV: 98 fL (ref 78.0–100.0)
Monocytes Absolute: 0.3 10*3/uL (ref 0.1–1.0)
Monocytes Relative: 4 %
NEUTROS ABS: 8.3 10*3/uL — AB (ref 1.7–7.7)
NEUTROS PCT: 90 %
Platelets: 217 10*3/uL (ref 150–400)
RBC: 3.42 MIL/uL — ABNORMAL LOW (ref 4.22–5.81)
RDW: 13.9 % (ref 11.5–15.5)
WBC: 9.2 10*3/uL (ref 4.0–10.5)

## 2016-01-01 LAB — TROPONIN I
TROPONIN I: 0.12 ng/mL — AB (ref <0.031)
TROPONIN I: 0.11 ng/mL — AB (ref <0.031)

## 2016-01-01 LAB — BRAIN NATRIURETIC PEPTIDE: B Natriuretic Peptide: 122.1 pg/mL — ABNORMAL HIGH (ref 0.0–100.0)

## 2016-01-01 MED ORDER — CARBAMAZEPINE 100 MG PO CHEW
300.0000 mg | CHEWABLE_TABLET | Freq: Two times a day (BID) | ORAL | Status: DC
  Administered 2016-01-01 – 2016-01-04 (×6): 300 mg via ORAL
  Filled 2016-01-01 (×6): qty 3

## 2016-01-01 MED ORDER — DOCUSATE SODIUM 100 MG PO CAPS
100.0000 mg | ORAL_CAPSULE | Freq: Every day | ORAL | Status: DC
  Administered 2016-01-02 – 2016-01-04 (×3): 100 mg via ORAL
  Filled 2016-01-01 (×3): qty 1

## 2016-01-01 MED ORDER — SERTRALINE HCL 50 MG PO TABS: 150.0000 mg | ORAL_TABLET | ORAL | Status: DC

## 2016-01-01 MED ORDER — METHYLPREDNISOLONE SODIUM SUCC 125 MG IJ SOLR
60.0000 mg | INTRAMUSCULAR | Status: DC
  Administered 2016-01-01 – 2016-01-02 (×2): 60 mg via INTRAVENOUS
  Filled 2016-01-01 (×2): qty 2

## 2016-01-01 MED ORDER — DOXYCYCLINE HYCLATE 100 MG PO TABS
100.0000 mg | ORAL_TABLET | Freq: Two times a day (BID) | ORAL | Status: DC
  Administered 2016-01-01 – 2016-01-04 (×6): 100 mg via ORAL
  Filled 2016-01-01 (×6): qty 1

## 2016-01-01 MED ORDER — ENOXAPARIN SODIUM 40 MG/0.4ML ~~LOC~~ SOLN: 40.0000 mg | SUBCUTANEOUS | Status: DC

## 2016-01-01 MED ORDER — METOPROLOL SUCCINATE ER 25 MG PO TB24
12.5000 mg | ORAL_TABLET | Freq: Every day | ORAL | Status: DC
  Administered 2016-01-02 – 2016-01-04 (×3): 12.5 mg via ORAL
  Filled 2016-01-01 (×3): qty 1

## 2016-01-01 MED ORDER — IPRATROPIUM BROMIDE 0.02 % IN SOLN
0.5000 mg | Freq: Once | RESPIRATORY_TRACT | Status: AC
  Administered 2016-01-01: 0.5 mg via RESPIRATORY_TRACT

## 2016-01-01 MED ORDER — BENZONATATE 100 MG PO CAPS: 100.0000 mg | ORAL_CAPSULE | Freq: Two times a day (BID) | ORAL | Status: DC | PRN

## 2016-01-01 MED ORDER — MOMETASONE FURO-FORMOTEROL FUM 100-5 MCG/ACT IN AERO
2.0000 | INHALATION_SPRAY | Freq: Two times a day (BID) | RESPIRATORY_TRACT | Status: DC
  Administered 2016-01-01 – 2016-01-04 (×6): 2 via RESPIRATORY_TRACT
  Filled 2016-01-01 (×2): qty 8.8

## 2016-01-01 MED ORDER — TAMSULOSIN HCL 0.4 MG PO CAPS: 0.4000 mg | ORAL_CAPSULE | Freq: Every day | ORAL | Status: DC

## 2016-01-01 MED ORDER — ASPIRIN EC 81 MG PO TBEC
81.0000 mg | DELAYED_RELEASE_TABLET | Freq: Every day | ORAL | Status: DC
  Administered 2016-01-02 – 2016-01-04 (×3): 81 mg via ORAL
  Filled 2016-01-01 (×3): qty 1

## 2016-01-01 MED ORDER — TAMSULOSIN HCL 0.4 MG PO CAPS
0.4000 mg | ORAL_CAPSULE | Freq: Every day | ORAL | Status: DC
  Administered 2016-01-01 – 2016-01-03 (×3): 0.4 mg via ORAL
  Filled 2016-01-01 (×3): qty 1

## 2016-01-01 MED ORDER — CLOPIDOGREL BISULFATE 75 MG PO TABS
75.0000 mg | ORAL_TABLET | Freq: Every day | ORAL | Status: DC
  Administered 2016-01-02 – 2016-01-04 (×3): 75 mg via ORAL
  Filled 2016-01-01 (×3): qty 1

## 2016-01-01 MED ORDER — SENNA 8.6 MG PO TABS: 1.0000 | ORAL_TABLET | Freq: Every day | ORAL | Status: DC | PRN

## 2016-01-01 MED ORDER — SODIUM CHLORIDE 0.9% FLUSH
3.0000 mL | Freq: Two times a day (BID) | INTRAVENOUS | Status: DC
  Administered 2016-01-01 – 2016-01-03 (×5): 3 mL via INTRAVENOUS

## 2016-01-01 MED ORDER — FINASTERIDE 5 MG PO TABS
5.0000 mg | ORAL_TABLET | Freq: Every day | ORAL | Status: DC
  Administered 2016-01-02 – 2016-01-04 (×3): 5 mg via ORAL
  Filled 2016-01-01 (×3): qty 1

## 2016-01-01 MED ORDER — SODIUM CHLORIDE 0.9% FLUSH: 3.0000 mL | INTRAVENOUS | Status: DC | PRN

## 2016-01-01 MED ORDER — ISOSORBIDE MONONITRATE ER 30 MG PO TB24
90.0000 mg | ORAL_TABLET | Freq: Every day | ORAL | Status: DC
Start: 2016-01-02 — End: 2016-01-04
  Administered 2016-01-02 – 2016-01-04 (×3): 90 mg via ORAL
  Filled 2016-01-01 (×3): qty 1

## 2016-01-01 MED ORDER — ACETAMINOPHEN 325 MG PO TABS: 650.0000 mg | ORAL_TABLET | Freq: Four times a day (QID) | ORAL | Status: DC | PRN

## 2016-01-01 MED ORDER — ACETAMINOPHEN 650 MG RE SUPP: 650.0000 mg | Freq: Four times a day (QID) | RECTAL | Status: DC | PRN

## 2016-01-01 MED ORDER — SERTRALINE HCL 50 MG PO TABS
150.0000 mg | ORAL_TABLET | ORAL | Status: DC
  Administered 2016-01-02: 150 mg via ORAL
  Filled 2016-01-01: qty 1

## 2016-01-01 MED ORDER — IPRATROPIUM-ALBUTEROL 0.5-2.5 (3) MG/3ML IN SOLN
3.0000 mL | RESPIRATORY_TRACT | Status: DC
Start: 2016-01-01 — End: 2016-01-04
  Administered 2016-01-01 – 2016-01-04 (×16): 3 mL via RESPIRATORY_TRACT
  Filled 2016-01-01 (×17): qty 3

## 2016-01-01 MED ORDER — ATORVASTATIN CALCIUM 40 MG PO TABS
40.0000 mg | ORAL_TABLET | Freq: Every day | ORAL | Status: DC
  Administered 2016-01-01 – 2016-01-03 (×3): 40 mg via ORAL
  Filled 2016-01-01 (×3): qty 1

## 2016-01-01 MED ORDER — FAMOTIDINE 20 MG PO TABS
20.0000 mg | ORAL_TABLET | Freq: Two times a day (BID) | ORAL | Status: DC
  Administered 2016-01-01 – 2016-01-04 (×6): 20 mg via ORAL
  Filled 2016-01-01 (×6): qty 1

## 2016-01-01 MED ORDER — ADULT MULTIVITAMIN W/MINERALS CH
1.0000 | ORAL_TABLET | Freq: Every day | ORAL | Status: DC
  Administered 2016-01-02 – 2016-01-04 (×3): 1 via ORAL
  Filled 2016-01-01 (×4): qty 1

## 2016-01-01 MED ORDER — TEMAZEPAM 15 MG PO CAPS
15.0000 mg | ORAL_CAPSULE | Freq: Every evening | ORAL | Status: DC | PRN
  Administered 2016-01-01: 15 mg via ORAL
  Filled 2016-01-01: qty 1

## 2016-01-01 MED ORDER — ALBUTEROL SULFATE (2.5 MG/3ML) 0.083% IN NEBU: 2.5000 mg | INHALATION_SOLUTION | RESPIRATORY_TRACT | Status: DC | PRN

## 2016-01-01 MED ORDER — SODIUM CHLORIDE 0.9 % IV SOLN: 250.0000 mL | INTRAVENOUS | Status: DC | PRN

## 2016-01-01 MED ORDER — ALBUTEROL SULFATE (2.5 MG/3ML) 0.083% IN NEBU
2.5000 mg | INHALATION_SOLUTION | Freq: Once | RESPIRATORY_TRACT | Status: AC
  Administered 2016-01-01: 2.5 mg via RESPIRATORY_TRACT

## 2016-01-01 MED ORDER — POTASSIUM CHLORIDE CRYS ER 20 MEQ PO TBCR
20.0000 meq | EXTENDED_RELEASE_TABLET | Freq: Every day | ORAL | Status: DC
Start: 2016-01-02 — End: 2016-01-04
  Administered 2016-01-02 – 2016-01-04 (×3): 20 meq via ORAL
  Filled 2016-01-01 (×3): qty 1

## 2016-01-01 MED ORDER — CALCIUM CARBONATE-VITAMIN D 500-200 MG-UNIT PO TABS
2.0000 | ORAL_TABLET | Freq: Every day | ORAL | Status: DC
  Administered 2016-01-02: 2 via ORAL
  Filled 2016-01-01: qty 2

## 2016-01-01 MED ORDER — FUROSEMIDE 40 MG PO TABS
40.0000 mg | ORAL_TABLET | Freq: Every morning | ORAL | Status: DC
  Administered 2016-01-02 – 2016-01-04 (×3): 40 mg via ORAL
  Filled 2016-01-01 (×3): qty 1

## 2016-01-01 MED ORDER — SODIUM CHLORIDE 0.9% FLUSH
3.0000 mL | Freq: Two times a day (BID) | INTRAVENOUS | Status: DC
  Administered 2016-01-02 – 2016-01-03 (×3): 3 mL via INTRAVENOUS

## 2016-01-01 NOTE — Progress Notes (Signed)
Patient ID: [name], male   DOB: 11/06/1924, 82 y.o.   MRN: [id number]   [facility] Aquilla Hacker, MD Phone: [phone]  Subjective:   # ED F/U Asthma Exacerbation - doing worse since I saw him in the ED two days ago.  - Requring up to 2-3 L of O2 at home now.  - He is wheezing more.  - Continues to have sputum production, and had a fever to 100.8 at home.  - he has been using albuterol and asthma medicine at home, but little relief.  - He has taken the prednisone for two days with little relief. He did not take the Doxycycline.  - He has not had any chest pain, he has not had any LE edema. He denies left arm pain, jaw pain, nausea, vomiting, or diaphoresis.  - No one has been sick at home otherwise.  - He is a former smoker.  - He has not been hospitalized for asthma for many many years. - He says he has had asthma ever since his CABG and multiple cardiac procedures.  - No light headedness, dizziness, or weakness. Eating and drinking ok.   All relevant systems were reviewed and were negative unless otherwise noted in the HPI  Past Medical History Reviewed problem list.  Medications- reviewed and updated Current Outpatient Prescriptions  Medication Sig Dispense Refill  . acetaminophen (TYLENOL) 500 MG tablet Take 500 mg by mouth at bedtime. Take at bedtime every night per patient    . albuterol (PROVENTIL HFA;VENTOLIN HFA) 108 (90 Base) MCG/ACT inhaler Inhale 2 puffs into the lungs every 6 (six) hours as needed for wheezing or shortness of breath. 1 Inhaler 0  . aspirin EC 81 MG EC tablet Take 1 tablet (81 mg total) by mouth daily.    [name] atorvastatin (LIPITOR) 40 MG tablet Take 40 mg by mouth daily at 6 PM.    . benzonatate (TESSALON) 100 MG capsule Take 1 capsule (100 mg total) by mouth 2 (two) times daily as needed for cough. 20 capsule 0  . budesonide-formoterol (SYMBICORT) 80-4.5 MCG/ACT inhaler Inhale 2 puffs into the lungs 2 (two) times daily.     . calcium-vitamin D (OSCAL WITH D) 500-200 MG-UNIT per tablet Take 2 tablets by mouth daily.    . carbamazepine (TEGRETOL) 100 MG chewable tablet Chew 300 mg by mouth 2 (two) times daily.    . clopidogrel (PLAVIX) 75 MG tablet Take 1 tablet (75 mg total) by mouth daily.    [name] docusate sodium (COLACE) 100 MG capsule Take 100 mg by mouth daily.    [name] doxycycline (VIBRA-TABS) 100 MG tablet Take 1 tablet (100 mg total) by mouth 2 (two) times daily. 10 tablet 0  . finasteride (PROSCAR) 5 MG tablet Take 5 mg by mouth daily.     . furosemide (LASIX) 40 MG tablet Take 40 mg by mouth every morning.    [name] ipratropium (ATROVENT HFA) 17 MCG/ACT inhaler Inhale 2 puffs into the lungs 2 (two) times daily as needed for wheezing.     . isosorbide mononitrate (IMDUR) 60 MG 24 hr tablet Take 90 mg by mouth daily.     . metoprolol succinate (TOPROL-XL) 12.5 mg TB24 24 hr tablet Take 12.5 mg by mouth daily.    . Multiple Vitamins-Minerals (MULTIVITAMIN WITH MINERALS) tablet Take 1 tablet by mouth daily.    [name] NITROSTAT 0.4 MG SL tablet PLACE 1 TABLET UNDER THE TONGUE EVERY 5 MINUTES ASDIRECTED BY PHYSICIAN  25 tablet 3  . potassium chloride SA (K-DUR,KLOR-CON) 20 MEQ tablet Take 20 mEq by mouth daily.     . predniSONE (DELTASONE) 50 MG tablet Take 1 tablet (50 mg total) by mouth daily. 5 tablet 0  . ranitidine (ZANTAC) 150 MG tablet TAKE 1 TABLET BY MOUTH 2 TIMES DAILY. 180 tablet 3  . senna (SENOKOT) 8.6 MG tablet Take 1 tablet by mouth daily as needed for constipation.     . sertraline (ZOLOFT) 100 MG tablet Take 150 mg by mouth as directed. 1 AND 1/2 TABLET BY MOUTH DAILY    . tamsulosin (FLOMAX) 0.4 MG CAPS capsule Take 1 capsule (0.4 mg total) by mouth daily. 30 capsule 0  . temazepam (RESTORIL) 15 MG capsule Take one capsule by mouth every night at bedtime as needed for sleep 30 capsule 5   No current facility-administered medications for this visit.   Chief complaint-noted No additions to family  history Social history- patient is a former smoker  Objective: BP 135/68 mmHg  Pulse 85  Temp(Src) 97.5 F (36.4 C) (Oral)  Wt 162 lb 6.4 oz (73.664 kg)  SpO2 92% Gen: NAD, alert, cooperative with exam HEENT: NCAT, EOMI, PERRL Neck: FROM, supple CV: Thought to be IRR, but RRR upon second auscultation, S1/S2, 1-2/6 SEM murmur Resp: Increased WOB, Increased rate, Diffuse wheezing with inspiration and expiration, this while wearing O2 at 3L [place]. He has some rales in the bases BL.  Abd: SNTND, BS present, no guarding or organomegaly Ext: No edema, warm, normal tone, moves UE/LE spontaneously Neuro: Alert and oriented, No gross deficits Skin: no rashes no lesions  Assessment/Plan:  # Acute Asthma Exacerbation - failing outpatient therapy. Needs admission to the hospital for IV steroids and antibiotics given fever and sputum production. SOB is an ACS equivalent, however his symptoms are atypical. He is high risk, however and warrants ACS rule out.  - Direct admitted from clinic. Seen by and discussed with Dr. Romero Liner - Clinic Supervising Attending.  - CXR, EKG - CMET, CBC, Trops, BNP - Start on IV steroids and start Doxycycline PO.  - Further management per inpatient team.

## 2016-01-01 NOTE — H&P (Signed)
[facility] Admission History and Physical Service Pager: [phone]  Patient name: [name] Medical record number: [id number] Date of birth: 1925/03/15 Age: 82 y.o. Gender: male  Primary Care Provider: Paula Compton, MD Consultants: none Code Status: Partial (does not wish intubation, but ok with other resuscitative measures)  Chief Complaint: dyspnea, cough  Assessment and Plan: SIMS DUPIN is a 82 y.o. male presenting with dyspnea/cough . PMH is significant for COPD, Asthma, CAD, HTN, GERD, LBB, Systolic CHF, BPH, depression, sleep disturbance  # Dyspnea: COPD exacerbation vs Asthma exacerbation vs PNA.  Seen in office today.  Was seen in ED 2 days ago and sent home on Prednisone burst and Doxy.  However, did not pick up doxy.  No CP or fluid overload on exam but given cardiac history, will order trop and BNP.  Afebrile, RR 22 on 3L O2, VS otherwise stable.  Used to use O2 at home but has not used in some time now.  On Atrovent, Albuterol, Symbicort at home. - Admit to telemetry under Dr Ardelia Mems - VS per floor protocol - CMP, CBC, BNP, Troponin ordered - EKG, CXR - Duonebs scheduled q4, Albuterol q2 prn, Dulera (while in hospital) - Doxycycline (5/4>), Solumedrol IV 60mg  (5/4>).  Transition IV steroids to orals when able. - Tessalon prn cough - Titrate O2 to room air as able - Perform ambulatory O2 before discharge  # CAD/sCHF/LBB: has pacemaker in place.  [place] outpatient.  Takes Lipitor, Plavix, Imdur, Toprol XL 12.5, Lasix, KDur at home - Continue home medications - EKG as above  # GERD: On Zantac 150mg  BID at home - Pepcid while hospitalized  # BPH: On Proscar and Flomax at home.  Notes has had urinary retention in past. - Continue while hospitalized - Monitor I/Os  # Depression/ sleep disorder: Takes temazepam and zoloft at home - Continue home medications - Did discuss that if O2 requirement goes up, will likely dc  Temazepam and switch to something else.  FEN/GI: SLIV, Pepcid as above Prophylaxis: Lovenox sub-q  Disposition: Telemetry  History of Present Illness:  [name] is a 82 y.o. male presenting with dyspnea   Patient reports that he has had increased SOB and cough since Sunday.  He reports that he has been using Mucinex and Tylenol at home with no relief.  Was seen in ED on 5/2 and discharged with Prednisone and Doxycyline.  He reports that he did not fill the doxycycline but did take the prednisone.  He little improvement in symptoms and followed up in clinic today.  He was directly admitted from clinic.  He notes a fever to 100.69F yesterday.  Denies chills, myalgia, hemoptysis, nausea, vomiting, dysuria, hematuria, LE.  Endorses productive cough, SOB, orthopnea (needs 3 pillows at night.  Normally uses 2), increased O2 need (has been off of O2 at home for "a while" but now needs 2-3L), decreased appetite.  Patient resides at home with his wife.  Nonsmoker, no ETOH, no drug use.  Review Of Systems: Per HPI with the following additions: none Otherwise the remainder of the systems were negative.  Patient Active Problem List   Diagnosis Date Noted  . Asthma exacerbation 01/01/2016  . Shortness of breath 12/30/2015  . Pacemaker 12/25/2015  . Atrial flutter, RVR 04/10/2015  . Unstable angina ([place]) 04/10/2015  . CAD (coronary artery disease), previous stents to mLAD,  04/10/2015  . Status post left inguinal hernia repair June 2016 02/01/2015  . Syncope  09/06/2014  . Pain in the chest   . Urinary retention due to benign prostatic hyperplasia   . Systolic CHF ([place]) [id number]  . Depression 09/26/2013  . Orthostatic hypotension 09/26/2013  . Acute posthemorrhagic anemia 09/07/2013  . Intertrochanteric fracture of right hip ([place]) 08/29/2013  . Hip fracture ([place]) 08/29/2013  . Hypertension   . GERD (gastroesophageal reflux disease)   . COPD (chronic obstructive pulmonary disease) ([place])    . H/O cardiac arrest   . Malaise and fatigue 05/09/2013  . Junctional bradycardia 07/25/2012  . Glucose intolerance (impaired glucose tolerance) 07/19/2012  . Normocytic anemia 07/19/2012  . SIRS (systemic inflammatory response syndrome) ([place]) 07/17/2012  . Bacteremia due to Escherichia coli 07/17/2012  . Acute MI, anterolateral wall ([place]) 07/16/2012  . Laceration of head 07/16/2012  . Prostatitis 07/16/2012  . E. coli UTI (urinary tract infection) 07/16/2012  . Elevated transaminase level 07/16/2012  . Foot drop, right 07/28/2011  . LBBB (left bundle branch block) 03/29/2011  . Hx of CABG and re-do CABG  03/29/2011  . Ischemic heart disease   . Myocardial infarction ([place])   . Acute systolic congestive heart failure, NYHA class 2-EF 40-45%   . Chronic respiratory failure ([place])   . Chronic asthma   . Peripheral neuropathy ([place])   . Dizziness   . Dyslipidemia (high LDL; low HDL)   . PERIPHERAL NEUROPATHY 04/04/2007  . Essential hypertension 04/04/2007  . PSORIASIS 04/04/2007  . OSTEOARTHRITIS 04/04/2007  . OSTEOPOROSIS 04/04/2007  . PROSTATE CANCER, HX OF 04/04/2007  . HYPERLIPIDEMIA 01/05/2007  . Cor Athrs[place]-Uns Vessel 01/05/2007  . GERD 01/05/2007  . HERNIA 01/05/2007    Past Medical History: Past Medical History  Diagnosis Date  . Systolic CHF ([place])        . Dyslipidemia (high LDL; low HDL)        . Peripheral vascular disease (HCC)     thrombus- in leg- many yrs. ago- ?R- coumadin x1 mth  . Peripheral neuropathy ([place])   . Hiatal hernia   . Depression        . VT (ventricular tachycardia) ([place]) 06/2012    a. in the setting of STEMI b. on amio during hospitalization  . Prostatitis   . Hypertension        . Coronary artery disease     a. s/p CABG 1986 b. redo CABG 1993 c. PTCA 2008 d. DES 2010  . Insomnia        . GERD (gastroesophageal reflux disease)        . Peripheral neuropathy ([place])        . Diverticulosis   . Kidney stones   . Complication of  anesthesia     B/P dropped low and had to stay in recovery longer  . Hyperlipemia   . Asthma        . Pneumonia 1960's  . On home oxygen therapy     "2L; just at night" (01/31/2015)  . Anemia 2014    was on iron pills and then was able to come off  . Hepatitis     hepatitis- long time ago, occupational contamination   . Arthritis     "all over"  . Prostate cancer [facility])     radiation therapy- 2002  . Atrial flutter ([place])     a. diagnosed 03/2015    Past Surgical History: Past Surgical History  Procedure Laterality Date  . Cataract extraction w/ intraocular lens  implant, bilateral    . Fracture surgery  heel- crushed -2002,  (hardware)   . Joint replacement      R great toe  . Rhinoplasty  [id number]  . Lumbar laminectomy/decompression microdiscectomy  08/10/2011    Procedure: LUMBAR LAMINECTOMY/DECOMPRESSION MICRODISCECTOMY;  Surgeon: Cooper Render Pool;  Location: [place] NEURO ORS;  Service: Neurosurgery;  Laterality: Right;  RIGHT Lumbar five-sacral one LAMINECTOMY, MICRODISCECTOMY  . Lumbar wound debridement  08/22/2011    Procedure: LUMBAR WOUND DEBRIDEMENT;  Surgeon: Eustace Moore;  Location: [place] NEURO ORS;  Service: Neurosurgery;  Laterality: N/A;  Repair of CSF Leak requiring laminectomy  . Cystoscopy w/ ureteral stent placement Right 02/28/2013    Procedure: CYSTOSCOPY WITH RETROGRADE PYELOGRAM/URETERAL STENT PLACEMENT;  Surgeon: Dutch Gray, MD;  Location: [facility] ORS;  Service: Urology;  Laterality: Right;  . Tonsillectomy  1940  . Appendectomy  1943  . Hammer toe surgery Right     little toe  . Transurethral resection of prostate  x2  . Foot fusion Left 2000    heel  . Hardware removal Right 2002    great toe; Dr. Sharol Given  . Cystoscopy with ureteroscopy and stent placement Right 03/23/2013    Procedure: RIGHT URETEROSCOPY, LASER LITHO AND STENT PLACEMENT;  Surgeon: Fredricka Bonine, MD;  Location: [facility] OR[facility];  Service: Urology;  Laterality: Right;  . Holmium laser application  Right [id number]    Procedure: HOLMIUM LASER APPLICATION;  Surgeon: Fredricka Bonine, MD;  Location: [facility] ORS;  Service: Urology;  Laterality: Right;  . Intramedullary (im) nail intertrochanteric Right 08/29/2013    Procedure: INTRAMEDULLARY (IM) NAIL INTERTROCHANTRIC;  Surgeon: Marianna Payment, MD;  Location: [place];  Service: Orthopedics;  Laterality: Right;  . Left and right heart catheterization with coronary angiogram N/A 07/15/2012    Procedure: LEFT AND RIGHT HEART CATHETERIZATION WITH CORONARY ANGIOGRAM;  Surgeon: Jettie Booze, MD;  Location: [facility] C[facility] LAB;  Service: Cardiovascular;  Laterality: N/A;  . Percutaneous coronary stent intervention (pci-s) N/A 07/15/2012    Procedure: PERCUTANEOUS CORONARY STENT INTERVENTION (PCI-S);  Surgeon: Jettie Booze, MD;  Location: [facility] CATH LAB;  Service: Cardiovascular;  Laterality: N/A;  . Coronary artery bypass graft  1986;  1993    CABG X 5; CABG X 1  . Colonoscopy    . Knee arthroscopy Right X 2  . Inguinal hernia repair Left 01/31/2015    Procedure: OPEN REPAIR RECURRENT LEFT INGUINAL HERNIA;  Surgeon: Fanny Skates, MD;  Location: [place];  Service: General;  Laterality: Left;  . Insertion of mesh Left 01/31/2015    Procedure: INSERTION OF MESH;  Surgeon: Fanny Skates, MD;  Location: [place];  Service: General;  Laterality: Left;  . Ep implantable device N/A 04/11/2015    Procedure: Pacemaker Implant;  Surgeon: Will Meredith Leeds, MD;  Location: [place] CV LAB;  Service: Cardiovascular;  Laterality: N/A;    Social History: Social History  Substance Use Topics  . Smoking status: Never Smoker   . Smokeless tobacco: Never Used  . Alcohol Use: No   Additional social history: none  Please also refer to relevant sections of EMR.  Family History: Family History  Problem Relation Age of Onset  . Heart attack Mother   . Addison's disease Father   . Heart attack Sister   . Anesthesia problems Neg Hx   . Hypotension Neg Hx    . Malignant hyperthermia Neg Hx   . Pseudochol deficiency Neg Hx    Allergies and Medications: No Known Allergies No current facility-administered medications on file prior to  encounter.   Current Outpatient Prescriptions on File Prior to Encounter  Medication Sig Dispense Refill  . acetaminophen (TYLENOL) 500 MG tablet Take 500 mg by mouth at bedtime. Take at bedtime every night per patient    . albuterol (PROVENTIL HFA;VENTOLIN HFA) 108 (90 Base) MCG/ACT inhaler Inhale 2 puffs into the lungs every 6 (six) hours as needed for wheezing or shortness of breath. 1 Inhaler 0  . aspirin EC 81 MG EC tablet Take 1 tablet (81 mg total) by mouth daily.    [name] atorvastatin (LIPITOR) 40 MG tablet Take 40 mg by mouth daily at 6 PM.    . benzonatate (TESSALON) 100 MG capsule Take 1 capsule (100 mg total) by mouth 2 (two) times daily as needed for cough. 20 capsule 0  . budesonide-formoterol (SYMBICORT) 80-4.5 MCG/ACT inhaler Inhale 2 puffs into the lungs 2 (two) times daily.    . calcium-vitamin D (OSCAL WITH D) 500-200 MG-UNIT per tablet Take 2 tablets by mouth daily.    . carbamazepine (TEGRETOL) 100 MG chewable tablet Chew 300 mg by mouth 2 (two) times daily.    . clopidogrel (PLAVIX) 75 MG tablet Take 1 tablet (75 mg total) by mouth daily.    [name] docusate sodium (COLACE) 100 MG capsule Take 100 mg by mouth daily.    [name] doxycycline (VIBRA-TABS) 100 MG tablet Take 1 tablet (100 mg total) by mouth 2 (two) times daily. 10 tablet 0  . finasteride (PROSCAR) 5 MG tablet Take 5 mg by mouth daily.     . furosemide (LASIX) 40 MG tablet Take 40 mg by mouth every morning.    [name] ipratropium (ATROVENT HFA) 17 MCG/ACT inhaler Inhale 2 puffs into the lungs 2 (two) times daily as needed for wheezing.     . isosorbide mononitrate (IMDUR) 60 MG 24 hr tablet Take 90 mg by mouth daily.     . metoprolol succinate (TOPROL-XL) 12.5 mg TB24 24 hr tablet Take 12.5 mg by mouth daily.    . Multiple Vitamins-Minerals (MULTIVITAMIN  WITH MINERALS) tablet Take 1 tablet by mouth daily.    [name] NITROSTAT 0.4 MG SL tablet PLACE 1 TABLET UNDER THE TONGUE EVERY 5 MINUTES ASDIRECTED BY PHYSICIAN 25 tablet 3  . potassium chloride SA (K-DUR,KLOR-CON) 20 MEQ tablet Take 20 mEq by mouth daily.     . predniSONE (DELTASONE) 50 MG tablet Take 1 tablet (50 mg total) by mouth daily. 5 tablet 0  . ranitidine (ZANTAC) 150 MG tablet TAKE 1 TABLET BY MOUTH 2 TIMES DAILY. 180 tablet 3  . senna (SENOKOT) 8.6 MG tablet Take 1 tablet by mouth daily as needed for constipation.     . sertraline (ZOLOFT) 100 MG tablet Take 150 mg by mouth as directed. 1 AND 1/2 TABLET BY MOUTH DAILY    . tamsulosin (FLOMAX) 0.4 MG CAPS capsule Take 1 capsule (0.4 mg total) by mouth daily. 30 capsule 0  . temazepam (RESTORIL) 15 MG capsule Take one capsule by mouth every night at bedtime as needed for sleep 30 capsule 5    Objective: There were no vitals taken for this visit. Exam: General: awake, alert, elderly white male, resting in bed, NAD, wife at bedside Eyes: EOMI, sclera white ENTM: o/p clear, MMM Neck: supple, no JVD Cardiovascular: seemingly RRR, no murmurs Respiratory: global expiratory wheeze, no rales or rhonchi, normal WOB on 3L [place] Abdomen: flat, soft, NT/ND, +BS MSK: no LE edema, moves all extremities independently Skin: dry, intact Neuro: follows commands, no focal deficits  Psych: mood stable, affect appropriate, good eye contact  Labs and Imaging: CBC BMET   Recent Labs Lab 12/30/15 1118  WBC 7.5  HGB 12.1*  HCT 36.2*  PLT 216    Recent Labs Lab 12/30/15 1118  NA 134*  K 4.3  CL 100*  CO2 24  BUN 13  CREATININE 1.12  GLUCOSE 146*  CALCIUM 9.7      Janora Norlander, DO 01/01/2016, 3:11 PM PGY-2, [place] Intern pager: [phone], text pages welcome

## 2016-01-01 NOTE — Patient Instructions (Signed)
Thanks for letting us take care of you.   Unfortunately, you need to go to the hospital.   You will be directly admitted and will be seen by our team.   You will get a chest x-ray and be started on iv steroids and antibiotics, and also have some further testing done.   Thanks for letting us take care of you.   Sincerely, Paula Compton, MD Family Medicine -PGY 2

## 2016-01-01 NOTE — Progress Notes (Signed)
Discussed troponin rise trop I stat 0.01 to Troponin 0.12 with on-call cardiology. They stated it was likely demand ischemia and would not be concerned unless troponin increased into the 2-3 range.

## 2016-01-02 DIAGNOSIS — K219 Gastro-esophageal reflux disease without esophagitis: Secondary | ICD-10-CM

## 2016-01-02 DIAGNOSIS — I447 Left bundle-branch block, unspecified: Secondary | ICD-10-CM

## 2016-01-02 DIAGNOSIS — N2889 Other specified disorders of kidney and ureter: Secondary | ICD-10-CM

## 2016-01-02 DIAGNOSIS — I10 Essential (primary) hypertension: Secondary | ICD-10-CM

## 2016-01-02 DIAGNOSIS — R338 Other retention of urine: Secondary | ICD-10-CM

## 2016-01-02 DIAGNOSIS — J45909 Unspecified asthma, uncomplicated: Secondary | ICD-10-CM

## 2016-01-02 DIAGNOSIS — I5020 Unspecified systolic (congestive) heart failure: Secondary | ICD-10-CM

## 2016-01-02 DIAGNOSIS — J441 Chronic obstructive pulmonary disease with (acute) exacerbation: Principal | ICD-10-CM

## 2016-01-02 LAB — HEPATIC FUNCTION PANEL
ALBUMIN: 2.7 g/dL — AB (ref 3.5–5.0)
ALK PHOS: 60 U/L (ref 38–126)
ALT: 21 U/L (ref 17–63)
AST: 39 U/L (ref 15–41)
BILIRUBIN TOTAL: 0.2 mg/dL — AB (ref 0.3–1.2)
Total Protein: 5.7 g/dL — ABNORMAL LOW (ref 6.5–8.1)

## 2016-01-02 LAB — TROPONIN I
TROPONIN I: 0.1 ng/mL — AB (ref <0.031)
Troponin I: 0.1 ng/mL — ABNORMAL HIGH (ref <0.031)

## 2016-01-02 LAB — CBC
HCT: 31.9 % — ABNORMAL LOW (ref 39.0–52.0)
Hemoglobin: 10.3 g/dL — ABNORMAL LOW (ref 13.0–17.0)
MCH: 30.8 pg (ref 26.0–34.0)
MCHC: 32.3 g/dL (ref 30.0–36.0)
MCV: 95.5 fL (ref 78.0–100.0)
PLATELETS: 216 10*3/uL (ref 150–400)
RBC: 3.34 MIL/uL — ABNORMAL LOW (ref 4.22–5.81)
RDW: 13.6 % (ref 11.5–15.5)
WBC: 6.5 10*3/uL (ref 4.0–10.5)

## 2016-01-02 LAB — BASIC METABOLIC PANEL
Anion gap: 10 (ref 5–15)
BUN: 14 mg/dL (ref 6–20)
CALCIUM: 9.6 mg/dL (ref 8.9–10.3)
CHLORIDE: 98 mmol/L — AB (ref 101–111)
CO2: 29 mmol/L (ref 22–32)
CREATININE: 0.89 mg/dL (ref 0.61–1.24)
GFR calc non Af Amer: >60 mL/min (ref >60)
GLUCOSE: 91 mg/dL (ref 65–99)
Potassium: 4.6 mmol/L (ref 3.5–5.1)
SODIUM: 137 mmol/L (ref 135–145)

## 2016-01-02 MED ORDER — CALCIUM CARBONATE ANTACID 500 MG PO CHEW: 1.0000 | CHEWABLE_TABLET | Freq: Four times a day (QID) | ORAL | Status: DC | PRN

## 2016-01-02 MED ORDER — TEMAZEPAM 7.5 MG PO CAPS
7.5000 mg | ORAL_CAPSULE | Freq: Every evening | ORAL | Status: DC | PRN
  Administered 2016-01-02 – 2016-01-03 (×2): 7.5 mg via ORAL
  Filled 2016-01-02 (×2): qty 1

## 2016-01-02 MED ORDER — LORAZEPAM 0.5 MG PO TABS
0.5000 mg | ORAL_TABLET | Freq: Three times a day (TID) | ORAL | Status: DC | PRN
  Administered 2016-01-02: 0.5 mg via ORAL
  Filled 2016-01-02: qty 1

## 2016-01-02 NOTE — Progress Notes (Signed)
Family Medicine Teaching Service Daily Progress Note Intern Pager: [phone]  Patient name: [name] Medical record number: [id number] Date of birth: 1925-06-14 Age: 82 y.o. Gender: male  Primary Care Provider: Paula Compton, MD Consultants: none Code Status: Partial (does not wish intubation, but ok with other resuscitative measures)  Chief Complaint: dyspnea, cough  Assessment and Plan: [name] is a 82 y.o. male presenting with dyspnea/cough . PMH is significant for COPD, Asthma, CAD, HTN, GERD, LBB, Systolic CHF, BPH, depression, sleep disturbance  Dyspnea: improved. Still with some effort to breath. On Atrovent, Albuterol, Symbicort at home. On 2L at home.  - Duonebs scheduled q4, Albuterol q2 prn, Dulera (while in hospital) - Doxycycline (5/4>), Solumedrol IV 60mg  (5/4>). - Tessalon prn cough  CAD s/p CABG x2 and stent x2: no CP or sign of fluid overload on exam but given cardiac history, will order trop and BNP.Has pacemaker in place.[place] outpatient. Takes Lipitor, Plavix, Imdur, Toprol XL 12.5, KDur at home. EKG with LBB (old and paced). Troponin 0.12>0.11>0.1>0.1. Discussed with cardiology yesterday and thought it is demand. They are not concerned unless it is in the range of 2-3. - Card consult this morning - Continue home medications  CHF: last echo in 08/2014 with EF of 55-60%, mild LVH, mod LAE and PAPP of 33. No sign of fluid overload on exam and CXR. BNP 122 (no prior). On Toprol XL 12.5 and lasix at home. -Will continue home meds -strict I&O  GERD: On Zantac 150mg  BID at home - Pepcid while hospitalized  BPH: On Proscar and Flomax at home. Notes has had urinary retention in past. - Continue while hospitalized - Monitor I/Os  Depression/ sleep disorder: Takes temazepam and zoloft at home - Continue home medications - Did discuss that if O2 requirement goes up, will likely dc Temazepam and switch to something else.  FEN/GI:  Heart  healthy diet Pepcid as above  Prophylaxis: Lovenox sub-q  Subjective:  Patient sitting on bed eating breakfast. He says his breathing has improved although he still have some effort to breath. Denies chest pain or belly pain.  Objective: Temp:  [97.5 F (36.4 C)-98.3 F (36.8 C)] 97.7 F (36.5 C) (05/05 0349) Pulse Rate:  [62-85] 62 (05/05 0349) Resp:  [20-22] 21 (05/05 0349) BP: (124-147)/(61-70) 147/70 mmHg (05/05 0349) SpO2:  [92 %-99 %] 97 % (05/05 0432) Weight:  [157 lb 4.8 oz (71.351 kg)-162 lb 6.4 oz (73.664 kg)] 157 lb 4.8 oz (71.351 kg) (05/05 0349) Physical Exam: GEN: elderly, appears well, sitting in bed eating his breakfast. CVS: regular rate and rythm, normal s1 and s2, no murmurs, no edema RESP: [place] in place, no increased work of breathing, no crackles, some wheeze bilaterally, diminished air sounds bilaterally over lower lung fields GI: normal bowel sound, soft, non-tender,non-distended NEURO: alert and oriented, no gross defecits  PSYCH: good affect  Laboratory:  Recent Labs Lab 12/30/15 1118 01/01/16 1635 01/02/16 0543  WBC 7.5 9.2 6.5  HGB 12.1* 10.7* 10.3*  HCT 36.2* 33.5* 31.9*  PLT 216 217 216    Recent Labs Lab 12/30/15 1118 01/01/16 1635 01/02/16 0543  NA 134* 137 137  K 4.3 5.1 4.6  CL 100* 100* 98*  CO2 24 24 29   BUN 13 16 14   CREATININE 1.12 0.97 0.89  CALCIUM 9.7 9.9 9.6  PROT  --  6.7  --   BILITOT  --  0.4  --   ALKPHOS  --  69  --  ALT  --  22  --   AST  --  45*  --   GLUCOSE 146* 148* 91    Imaging/Diagnostic Tests: Dg Chest 2 View  01/01/2016  CLINICAL DATA:  Pt c/o dyspnea, productive cough of white sputum and mid-sternal chest pains from coughing x 4 days. Pt states he is currently on oxygen at home and here in the hospital. Pt states his dyspnea is slightly relieved when he is sitting up. Hx cardiac surgery, multiple heart and lung conditions and HTN controlled with medication. EXAM: CHEST  2 VIEW COMPARISON:  12/30/2015  FINDINGS: Changes from CABG surgery are stable. Cardiac silhouette is top-normal in size. No mediastinal or hilar masses or evidence of adenopathy. Lungs are clear.  No pleural effusion or pneumothorax. Left anterior chest wall sequential pacemaker is stable and well positioned. Bony thorax is demineralized but grossly intact. IMPRESSION: No acute cardiopulmonary disease. Electronically Signed   By: Lajean Manes M.D.   On: 01/01/2016 17:22    Mercy Riding, MD 01/02/2016, 7:15 AM PGY-1, [place] Intern pager: [phone], text pages welcome

## 2016-01-02 NOTE — Progress Notes (Signed)
Patient ambulated to the nurses station with walker and minimum assistance.  Patient had some mild shortness of breath and oxygen saturation 92-94% on room air.  Will continue to monitor.

## 2016-01-03 DIAGNOSIS — R0600 Dyspnea, unspecified: Secondary | ICD-10-CM

## 2016-01-03 DIAGNOSIS — J449 Chronic obstructive pulmonary disease, unspecified: Secondary | ICD-10-CM

## 2016-01-03 DIAGNOSIS — R0602 Shortness of breath: Secondary | ICD-10-CM | POA: Insufficient documentation

## 2016-01-03 MED ORDER — CALCIUM CARBONATE-VITAMIN D 500-200 MG-UNIT PO TABS
2.0000 | ORAL_TABLET | Freq: Every day | ORAL | Status: DC
  Administered 2016-01-03 – 2016-01-04 (×2): 2 via ORAL
  Filled 2016-01-03 (×2): qty 2

## 2016-01-03 MED ORDER — SERTRALINE HCL 50 MG PO TABS
150.0000 mg | ORAL_TABLET | Freq: Every day | ORAL | Status: DC
  Administered 2016-01-04: 150 mg via ORAL
  Filled 2016-01-03: qty 1

## 2016-01-03 MED ORDER — PREDNISONE 50 MG PO TABS
50.0000 mg | ORAL_TABLET | Freq: Every day | ORAL | Status: DC
  Administered 2016-01-03: 50 mg via ORAL
  Filled 2016-01-03: qty 1

## 2016-01-03 NOTE — Progress Notes (Signed)
Family Medicine Teaching Service Daily Progress Note Intern Pager: [phone]  Patient name: [name] Medical record number: [id number] Date of birth: August 20, 1925 Age: 82 y.o. Gender: male  Primary Care Provider: Paula Compton, MD Consultants: none Code Status: Partial (does not wish intubation, but ok with other resuscitative measures)  Chief Complaint: dyspnea, cough  Assessment and Plan: [name] is a 82 y.o. male presenting with dyspnea/cough . PMH is significant for COPD, Asthma, CAD, HTN, GERD, LBB, Systolic CHF, BPH, depression, sleep disturbance  Dyspnea: improving. On 2L [place]. Afebrile. On Atrovent, Albuterol, Symbicort at home. On 2L at home.  - No PRN albuterol used - Duonebs scheduled q4, Albuterol q2 prn, Dulera (while in hospital) - Doxycycline (5/4>), Solumedrol IV 60mg  (5/4>5/6). Prednisone 50 mg started 5/6. - Tessalon prn cough  CAD s/p CABG x2 and stent x2: no CP or sign of fluid overload on exam but given cardiac history, will order trop and BNP.Has pacemaker in place.[name] outpatient. Takes Lipitor, Plavix, Imdur, Toprol XL 12.5, KDur at home. EKG with LBB (old and paced). Troponin 0.12>0.11>0.1>0.1.  - Discussed with cardiology 5/4 and thought it is demand. They are not concerned unless it is in the range of 2-3. - Continue home medications  CHF: last echo in 08/2014 with EF of 55-60%, mild LVH, mod LAE and PAPP of 33. No sign of fluid overload on exam and CXR. BNP 122 (no prior). On Toprol XL 12.5 and lasix at home. - Will continue home meds - strict I&O (1.1L UOP)  GERD: On Zantac 150mg  BID at home - Pepcid while hospitalized  BPH: On Proscar and Flomax at home. Notes has had urinary retention in past. - Continue while hospitalized - Monitor I/Os  Depression/ sleep disorder: Takes temazepam and zoloft at home - Continue home medications - Did discuss that if O2 requirement goes up, will likely dc Temazepam and switch to  something else. - Ativan x1 yesterday pm  FEN/GI:  Heart healthy diet, Pepcid as above Prophylaxis: Lovenox sub-q  Subjective:  Patient notes that he feels like his breathing is better.  He wants to know when he is going home.  Discussed transition to oral steroids today.  Occ cough.  No chills, nausea, vomiting.  Objective: Temp:  [98.2 F (36.8 C)-98.6 F (37 C)] 98.2 F (36.8 C) (05/06 0517) Pulse Rate:  [60-69] 69 (05/06 0517) Resp:  [16-18] 18 (05/06 0517) BP: (115-141)/(52-67) 141/67 mmHg (05/06 0517) SpO2:  [94 %-98 %] 97 % (05/06 0517) FiO2 (%):  [21 %-28 %] 28 % (05/05 1524) Weight:  [155 lb 14.4 oz (70.716 kg)] 155 lb 14.4 oz (70.716 kg) (05/06 0517) Physical Exam: GEN: elderly, appears well, sitting up in bed CVS: RRR, normal s1 and s2, no murmurs, no edema RESP: [place] in place, normal work of breathing, no rhonchi/ rales, + expiratory wheeze through (slighty better than previous) GI: +bowel sound, soft, non-tender,non-distended NEURO: alert and oriented, no gross deficits  PSYCH: mood stable, affect appropriate  Laboratory:  Recent Labs Lab 12/30/15 1118 01/01/16 1635 01/02/16 0543  WBC 7.5 9.2 6.5  HGB 12.1* 10.7* 10.3*  HCT 36.2* 33.5* 31.9*  PLT 216 217 216    Recent Labs Lab 12/30/15 1118 01/01/16 1635 01/02/16 0543 01/02/16 1102  NA 134* 137 137  --   K 4.3 5.1 4.6  --   CL 100* 100* 98*  --   CO2 24 24 29   --   BUN 13 16 14   --  CREATININE 1.12 0.97 0.89  --   CALCIUM 9.7 9.9 9.6  --   PROT  --  6.7  --  5.7*  BILITOT  --  0.4  --  0.2*  ALKPHOS  --  69  --  60  ALT  --  22  --  21  AST  --  45*  --  39  GLUCOSE 146* 148* 91  --     Imaging/Diagnostic Tests: No results found.  Janora Norlander, DO 01/03/2016, 8:20 AM PGY-2, [place] Intern pager: [phone], text pages welcome

## 2016-01-04 LAB — BASIC METABOLIC PANEL
Anion gap: 9 (ref 5–15)
BUN: 11 mg/dL (ref 6–20)
CHLORIDE: 101 mmol/L (ref 101–111)
CO2: 27 mmol/L (ref 22–32)
CREATININE: 0.84 mg/dL (ref 0.61–1.24)
Calcium: 9.6 mg/dL (ref 8.9–10.3)
GFR calc Af Amer: >60 mL/min (ref >60)
GFR calc non Af Amer: >60 mL/min (ref >60)
GLUCOSE: 92 mg/dL (ref 65–99)
POTASSIUM: 3.9 mmol/L (ref 3.5–5.1)
Sodium: 137 mmol/L (ref 135–145)

## 2016-01-04 LAB — CBC
HEMATOCRIT: 31.6 % — AB (ref 39.0–52.0)
Hemoglobin: 10.2 g/dL — ABNORMAL LOW (ref 13.0–17.0)
MCH: 30.4 pg (ref 26.0–34.0)
MCHC: 32.3 g/dL (ref 30.0–36.0)
MCV: 94.3 fL (ref 78.0–100.0)
Platelets: 253 10*3/uL (ref 150–400)
RBC: 3.35 MIL/uL — ABNORMAL LOW (ref 4.22–5.81)
RDW: 13.6 % (ref 11.5–15.5)
WBC: 7.1 10*3/uL (ref 4.0–10.5)

## 2016-01-04 LAB — TROPONIN I: Troponin I: 0.06 ng/mL — ABNORMAL HIGH (ref <0.031)

## 2016-01-04 MED ORDER — DOXYCYCLINE HYCLATE 100 MG PO TABS: 100.0000 mg | ORAL_TABLET | Freq: Two times a day (BID) | ORAL | Status: AC

## 2016-01-04 MED ORDER — PREDNISONE 10 MG (21) PO TBPK: ORAL_TABLET | ORAL | Status: DC

## 2016-01-04 MED ORDER — PREDNISONE 50 MG PO TABS
50.0000 mg | ORAL_TABLET | Freq: Every day | ORAL | Status: DC
  Administered 2016-01-04: 50 mg via ORAL
  Filled 2016-01-04: qty 1

## 2016-01-04 NOTE — Care Management Note (Signed)
Case Management Note  Patient Details  Name: [name] MRN: [id number] Date of Birth: 11-22-1924  Subjective/Objective:                  COPD exacerbation Action/Plan: Discharge planning Expected Discharge Date:  01/04/16               Expected Discharge Plan:  Home/Self Care  In-House Referral:     Discharge planning Services  CM Consult  Post Acute Care Choice:    Choice offered to:     DME Arranged:  Oxygen DME Agency:  [place]:    [facility] Agency:     Status of Service:  Completed, signed off  Medicare Important Message Given:    Date Medicare IM Given:    Medicare IM give by:    Date Additional Medicare IM Given:    Additional Medicare Important Message give by:     If discussed at [place] of Stay Meetings, dates discussed:    Additional Comments: CM received call from RN requesting home O2 tank for transport.  Pt going from PRN O2 at home to continuous O2.  New order has been placed and call made to [facility] The rep, Tiffany.  CM called [facility] DME rep, Merry Proud to please deliver the tank to room for discharge.  No other CM needs were communicated. Dellie Catholic, RN 01/04/2016, 12:05 PM

## 2016-01-04 NOTE — Discharge Summary (Signed)
[facility] Discharge Summary  Patient name: [name] Medical record number: [id number] Date of birth: [birth year] Age: 82 y.o. Gender: male Date of Admission: 01/01/2016  Date of Discharge: 01/04/2016 Admitting Physician: Lind Covert, MD  Primary Care Provider: Paula Compton, MD Consultants: none  Indication for Hospitalization: COPD exacerbation  Discharge Diagnoses/Problem List:  Present on Admission:  . COPD exacerbation ([place]) . CAD (coronary artery disease), previous stents to mLAD,  . Chronic asthma . COPD (chronic obstructive pulmonary disease) ([place]) . Essential hypertension . GERD . LBBB (left bundle branch block) . Systolic CHF ([place]) . Urinary retention due to benign prostatic hyperplasia  Disposition: Discharge home with wife  Discharge Condition: Stable  Discharge Exam:  BP 140/60 mmHg  Pulse 70  Temp(Src) 97.9 F (36.6 C) (Oral)  Resp 18  Ht 5\' 10"  (1.778 m)  Wt 154 lb 3.2 oz (69.945 kg)  BMI 22.13 kg/m2  SpO2 96% Gen: awake, alert, NAD HEENT: sclera anicteric, EOMI Cardio: RRR, no murmurs Pulm: normal WOB on [place], bibasilar expiratory wheeze (overall improved compared to previous exam) GI: soft, NT/ND, +BS Ext: WWP, no edema Skin: dry, intact Neuro: follows commands, no focal deficits Psych: mood stable, affect appropriate  Brief Hospital Course:  [name] is a 82 y.o. male presenting with dyspnea/cough . PMH is significant for COPD, Asthma, CAD, HTN, GERD, LBB, Systolic CHF, BPH, depression, sleep disturbance  He was directly admitted from clinic after being seen for a follow up after an ED visit for COPD exacerbation.  He had not taken the Doxy he was discharged on at that time, only the Prednisone.  WOB did not improve, so he was admitted.  CXR obtained which revealed no acute infiltrates.  He was initially treated with IV steroids and Doxycycline.  He was transitioned to Prednisone after 2 days of IV  steroids.  He was continued on Duonebs, Albuterol PRN and home COPD meds.  His breathing exam gradually improved and he was weaned from 3L O2 [place] to 2L O2 [place] (home dose).  Ambulatory O2 saturation was performed prior to discharge.  Desaturations to 88% on RA were noted.  Home O2 ordered.    Additionally, patient noted to have an increase in troponin 0.10 x2.  Cardiology was phone consulted with regards to this.  They believed this bump to be secondary to demand ischemia and did not recommend further ischemic work up unless troponin level was >2.  EKG unchanged from previous.  Troponin level was repeated at discharge and had down trended to 0.06.  Patient had no chest pain during hospitalization.  He was discharged on a Prednisone dose pack to taper over the next week.  He is to continue Doxycycline 100mg  BID for the next 4 days.  Discharge instructions and return precautions reviewed with patient, who voiced good understanding.  Hospital follow up appointment was reviewed with patient.  He was discharged in stable condition into the care of his wife.    Issues for Follow Up:  1. Breathing  Significant Procedures: none  Significant Labs and Imaging:   Recent Labs Lab 01/01/16 1635 01/02/16 0543 01/04/16 0410  WBC 9.2 6.5 7.1  HGB 10.7* 10.3* 10.2*  HCT 33.5* 31.9* 31.6*  PLT 217 216 253    Recent Labs Lab 12/30/15 1118 01/01/16 1635 01/02/16 0543 01/02/16 1102 01/04/16 0410  NA 134* 137 137  --  137  K 4.3 5.1 4.6  --  3.9  CL 100* 100* 98*  --  101  CO2 24 24 29   --  27  GLUCOSE 146* 148* 91  --  92  BUN 13 16 14   --  11  CREATININE 1.12 0.97 0.89  --  0.84  CALCIUM 9.7 9.9 9.6  --  9.6  ALKPHOS  --  69  --  60  --   AST  --  45*  --  39  --   ALT  --  22  --  21  --   ALBUMIN  --  3.1*  --  2.7*  --     Dg Chest 2 View  01/01/2016  CLINICAL DATA:  Pt c/o dyspnea, productive cough of white sputum and mid-sternal chest pains from coughing x 4 days. Pt states he is currently  on oxygen at home and here in the hospital. Pt states his dyspnea is slightly relieved when he is sitting up. Hx cardiac surgery, multiple heart and lung conditions and HTN controlled with medication. EXAM: CHEST  2 VIEW COMPARISON:  12/30/2015 FINDINGS: Changes from CABG surgery are stable. Cardiac silhouette is top-normal in size. No mediastinal or hilar masses or evidence of adenopathy. Lungs are clear.  No pleural effusion or pneumothorax. Left anterior chest wall sequential pacemaker is stable and well positioned. Bony thorax is demineralized but grossly intact. IMPRESSION: No acute cardiopulmonary disease. Electronically Signed   By: Lajean Manes M.D.   On: 01/01/2016 17:22    Results/Tests Pending at Time of Discharge: none  Discharge Medications:    Medication List    STOP taking these medications        predniSONE 50 MG tablet  Commonly known as:  DELTASONE  Replaced by:  predniSONE 10 MG (21) Tbpk tablet      TAKE these medications        acetaminophen 500 MG tablet  Commonly known as:  TYLENOL  Take 500 mg by mouth at bedtime. Take at bedtime every night per patient     albuterol 108 (90 Base) MCG/ACT inhaler  Commonly known as:  PROVENTIL HFA;VENTOLIN HFA  Inhale 2 puffs into the lungs every 6 (six) hours as needed for wheezing or shortness of breath.     aspirin 81 MG EC tablet  Take 1 tablet (81 mg total) by mouth daily.     atorvastatin 40 MG tablet  Commonly known as:  LIPITOR  Take 40 mg by mouth daily at 6 PM.     budesonide-formoterol 80-4.5 MCG/ACT inhaler  Commonly known as:  SYMBICORT  Inhale 2 puffs into the lungs 2 (two) times daily.     calcium-vitamin D 500-200 MG-UNIT tablet  Commonly known as:  OSCAL WITH D  Take 2 tablets by mouth daily.     carbamazepine 100 MG chewable tablet  Commonly known as:  TEGRETOL  Chew 300 mg by mouth 2 (two) times daily.     clopidogrel 75 MG tablet  Commonly known as:  PLAVIX  Take 1 tablet (75 mg total) by  mouth daily.     docusate sodium 100 MG capsule  Commonly known as:  COLACE  Take 100 mg by mouth daily.     doxycycline 100 MG tablet  Commonly known as:  VIBRA-TABS  Take 1 tablet (100 mg total) by mouth 2 (two) times daily. x4 days     finasteride 5 MG tablet  Commonly known as:  PROSCAR  Take 5 mg by mouth daily.     furosemide 40 MG tablet  Commonly known as:  LASIX  Take 40 mg by mouth every morning.     ipratropium 17 MCG/ACT inhaler  Commonly known as:  ATROVENT HFA  Inhale 2 puffs into the lungs 2 (two) times daily as needed for wheezing.     isosorbide mononitrate 60 MG 24 hr tablet  Commonly known as:  IMDUR  Take 90 mg by mouth daily.     metoprolol succinate 12.5 mg Tb24 24 hr tablet  Commonly known as:  TOPROL-XL  Take 12.5 mg by mouth daily.     multivitamin with minerals tablet  Take 1 tablet by mouth every evening.     NITROSTAT 0.4 MG SL tablet  Generic drug:  nitroGLYCERIN  PLACE 1 TABLET UNDER THE TONGUE EVERY 5 MINUTES ASDIRECTED BY PHYSICIAN     potassium chloride SA 20 MEQ tablet  Commonly known as:  K-DUR,KLOR-CON  Take 20 mEq by mouth daily.     predniSONE 10 MG (21) Tbpk tablet  Commonly known as:  STERAPRED UNI-PAK 21 TAB  Take as directed.     ranitidine 150 MG tablet  Commonly known as:  ZANTAC  TAKE 1 TABLET BY MOUTH 2 TIMES DAILY.     senna 8.6 MG tablet  Commonly known as:  SENOKOT  Take 1 tablet by mouth daily.     sertraline 100 MG tablet  Commonly known as:  ZOLOFT  Take 150 mg by mouth See admin instructions. 1 AND 1/2 TABLET BY MOUTH DAILY     tamsulosin 0.4 MG Caps capsule  Commonly known as:  FLOMAX  Take 1 capsule (0.4 mg total) by mouth daily.        Discharge Instructions: Please refer to Patient Instructions section of EMR for full details.  Patient was counseled important signs and symptoms that should prompt return to medical care, changes in medications, dietary instructions, activity restrictions, and  follow up appointments.   Follow-Up Appointments: Follow-up Information    Follow up with Lockie Pares, MD. Go on 01/16/2016.   Specialty:  Family Medicine   Why:  2:30pm (hospital follow up)   Contact information:   [place] [address] [phone]       Janora Norlander, DO 01/04/2016, 9:10 AM PGY-2, [place]

## 2016-01-04 NOTE — Progress Notes (Addendum)
Dr. Lajuana Ripple called to inquire about pts O2 sat while ambulating.  Pt on 2L and O2 sat 96%.

## 2016-01-04 NOTE — Progress Notes (Signed)
While ambulating pt desats to 88% without Oxygen

## 2016-01-04 NOTE — Discharge Instructions (Signed)
You were admitted from clinic for COPD exacerbation.  You had a chest xray that did not show evidence of pneumonia.  You were treated with IV steroids and transitioned to oral steroids that you will take for the next week.  This prescription has been sent to your pharmacy.  Additionally, you were treated with antibiotics.  You will continue to take Doxycycline for the next 4 days until gone.  Continue to use your inhalers as directed.   You were evaluated for home O2 renewal and were discharged with O2.  Follow up with Dr Emmaline Life at [facility] as scheduled.   Shortness of Breath Shortness of breath means you have trouble breathing. Shortness of breath needs medical care right away. HOME CARE   Do not smoke.  Avoid being around chemicals or things (paint fumes, dust) that may bother your breathing.  Rest as needed. Slowly begin your normal activities.  Only take medicines as told by your doctor.  Keep all doctor visits as told. GET HELP RIGHT AWAY IF:   Your shortness of breath gets worse.  You feel lightheaded, pass out (faint), or have a cough that is not helped by medicine.  You cough up blood.  You have pain with breathing.  You have pain in your chest, arms, shoulders, or belly (abdomen).  You have a fever.  You cannot walk up stairs or exercise the way you normally do.  You do not get better in the time expected.  You have a hard time doing normal activities even with rest.  You have problems with your medicines.  You have any new symptoms. MAKE SURE YOU:  Understand these instructions.  Will watch your condition.  Will get help right away if you are not doing well or get worse.   This information is not intended to replace advice given to you by your health care provider. Make sure you discuss any questions you have with your health care provider.   Document Released: 02/02/2008 Document Revised: 08/21/2013 Document Reviewed: 11/01/2011 Elsevier  Interactive Patient Education [employer].

## 2016-01-07 ENCOUNTER — Encounter: Payer: Self-pay | Admitting: Internal Medicine

## 2016-01-16 ENCOUNTER — Encounter: Payer: Self-pay | Admitting: Internal Medicine

## 2016-01-16 ENCOUNTER — Ambulatory Visit (INDEPENDENT_AMBULATORY_CARE_PROVIDER_SITE_OTHER): Payer: Medicare Other | Admitting: Internal Medicine

## 2016-01-16 VITALS — BP 150/53 | HR 61 | Temp 98.0°F | Ht 70.0 in | Wt 159.8 lb

## 2016-01-16 DIAGNOSIS — J449 Chronic obstructive pulmonary disease, unspecified: Secondary | ICD-10-CM

## 2016-01-16 LAB — COMPLETE METABOLIC PANEL WITH GFR
ALK PHOS: 83 U/L (ref 40–115)
ALT: 15 U/L (ref 9–46)
AST: 25 U/L (ref 10–35)
Albumin: 3.8 g/dL (ref 3.6–5.1)
BUN: 13 mg/dL (ref 7–25)
CHLORIDE: 98 mmol/L (ref 98–110)
CO2: 31 mmol/L (ref 20–31)
Calcium: 10 mg/dL (ref 8.6–10.3)
Creat: 0.86 mg/dL (ref 0.70–1.11)
GFR, EST NON AFRICAN AMERICAN: 77 mL/min (ref >=60)
GFR, Est African American: >89 mL/min (ref >=60)
GLUCOSE: 96 mg/dL (ref 65–99)
POTASSIUM: 4.5 mmol/L (ref 3.5–5.3)
SODIUM: 136 mmol/L (ref 135–146)
Total Bilirubin: 0.3 mg/dL (ref 0.2–1.2)
Total Protein: 6.7 g/dL (ref 6.1–8.1)

## 2016-01-16 LAB — CBC
HCT: 35.2 % — ABNORMAL LOW (ref 38.5–50.0)
HEMOGLOBIN: 11.9 g/dL — AB (ref 13.2–17.1)
MCH: 31.2 pg (ref 27.0–33.0)
MCHC: 33.8 g/dL (ref 32.0–36.0)
MCV: 92.1 fL (ref 80.0–100.0)
MPV: 8.4 fL (ref 7.5–12.5)
PLATELETS: 314 10*3/uL (ref 140–400)
RBC: 3.82 MIL/uL — ABNORMAL LOW (ref 4.20–5.80)
RDW: 14.2 % (ref 11.0–15.0)
WBC: 6.6 10*3/uL (ref 3.8–10.8)

## 2016-01-16 NOTE — Progress Notes (Signed)
Repeat note

## 2016-01-16 NOTE — Progress Notes (Signed)
   [name] Kerrin Mo, MD Phone: [phone]  Reason For Visit: F/u visit for COPD exacerbation, concern about hgb and low total protein   # COPD exacerbation: Denies any SOB, states in the past week has not used his albuterol medication at all. Has been on 2 Liters of home oxygen for the last two weeks, has stopped the oxygen at this point. Patient taking Symbicort as controller medication twice daily. No issues with this.   # Anemia  - No concerns about fatigue or other signs of anemia  - Has hx of taking iron in the past (many years ago), caused GI issues so patient stopped  - however wants CBC and Total protein repeated because hgb was low at 10.4 and total protein was low at 5.7 during this admission  Past Medical History Reviewed problem list.  Medications- reviewed and updated  Objective: BP 150/53 mmHg  Pulse 61  Temp(Src) 98 F (36.7 C) (Oral)  Ht 5\' 10"  (1.778 m)  Wt 159 lb 12.8 oz (72.485 kg)  BMI 22.93 kg/m2 Gen: NAD, alert, cooperative with exam CV:  Pacemaker, systolic murmur, 99991111, cap refill <3 Resp: CTABL, no wheezes, non-labored Abd: SNTND, BS present, no guarding or organomegaly  Assessment/Plan: See problem based a/p  COPD (chronic obstructive pulmonary disease) ([place]) F/u after seen at hospital for COPD exacerbation. Received steroids and doxycycline in [facility]. Sent home on 2 Liters of oxygen  - Pulse oximetry resting and walking above 90 sats, therefore no further oxygen required  - Continue symbicort and prn albuterol/atrovent   - In hospital had CBC with hgb 10.4 and total protein of 5.7, Will repeat CBC and CMP   - If hgb low consider iron panel and GI referral per patient request

## 2016-01-16 NOTE — Patient Instructions (Signed)
Your COPD seems to be improved. Follow up with your PCP as needed

## 2016-01-16 NOTE — Assessment & Plan Note (Addendum)
F/u after seen at hospital for COPD exacerbation. Received steroids and doxycycline in [facility]. Sent home on 2 Liters of oxygen  - Pulse oximetry resting and walking above 90 sats, therefore no further oxygen required  - Continue symbicort and prn albuterol/atrovent   - In hospital had CBC with hgb 10.4 and total protein of 5.7, Will repeat CBC and CMP   - If hgb low consider iron panel and GI referral per patient request

## 2016-01-18 ENCOUNTER — Encounter: Payer: Self-pay | Admitting: Cardiology

## 2016-01-18 ENCOUNTER — Encounter: Payer: Self-pay | Admitting: Family Medicine

## 2016-01-19 ENCOUNTER — Telehealth: Payer: Self-pay | Admitting: Cardiology

## 2016-01-19 NOTE — Telephone Encounter (Signed)
New message      *STAT* If patient is at the pharmacy, call can be transferred to refill team.   1. Which medications need to be refilled? (please list name of each medication and dose if known) Temazapam 15 mg po at night  2. Which pharmacy/location (including street and city if local pharmacy) is medication to be sent to? [facility] on [address] in [place]  3. Do they need a 30 day or 90 day supply? 30 day    the pt just needs to request faxed in please.

## 2016-01-19 NOTE — Telephone Encounter (Signed)
Called [name] about results of hgb and total protein. I recommended follow up in a month to recheck hemoglobin (11.9), could check an iron panel at that time if hgb remains low and consider GI referral.

## 2016-01-20 ENCOUNTER — Encounter: Payer: Self-pay | Admitting: Internal Medicine

## 2016-01-20 NOTE — Telephone Encounter (Signed)
Needs to come from  Primary care [name]

## 2016-01-20 NOTE — Telephone Encounter (Signed)
Spoke with pt, Aware of dr crenshaw's recommendations.  °

## 2016-01-20 NOTE — Telephone Encounter (Signed)
Upon chart review, med is not on current list, but appears per med history that Dr. Mare Ferrari used to Rx  Will defer to Dr. Lyda Kalata, RN

## 2016-01-22 ENCOUNTER — Telehealth: Payer: Self-pay | Admitting: *Deleted

## 2016-01-22 NOTE — Telephone Encounter (Signed)
Received a fax refill request from [facility] for temazepam 15 mg capsule. Medication is not listed on current med list.  Fax placed in provider box for review.  Derl Barrow, RN

## 2016-01-23 NOTE — Telephone Encounter (Signed)
Thanks for the message. He had sent me an email through [facility] requesting that i provide this medicine for him as it was previously provided by Dr. Mare Ferrari who is retiring. His new cardiologist did not feel comfortable prescribing. I am not comfortable either as I have never evaluated him for insomnia and there are other options that may be better for him. I am leaving the discussion regarding this medicine to him and his new PCP as I will be transitioning out soon. Thanks  CGM MD

## 2016-01-27 ENCOUNTER — Encounter: Payer: Self-pay | Admitting: Family Medicine

## 2016-01-27 ENCOUNTER — Ambulatory Visit (INDEPENDENT_AMBULATORY_CARE_PROVIDER_SITE_OTHER): Payer: Medicare Other | Admitting: *Deleted

## 2016-01-27 ENCOUNTER — Ambulatory Visit (INDEPENDENT_AMBULATORY_CARE_PROVIDER_SITE_OTHER): Payer: Medicare Other | Admitting: Family Medicine

## 2016-01-27 VITALS — BP 133/69 | HR 79 | Temp 98.0°F | Ht 70.0 in | Wt 159.0 lb

## 2016-01-27 DIAGNOSIS — G479 Sleep disorder, unspecified: Secondary | ICD-10-CM

## 2016-01-27 DIAGNOSIS — I495 Sick sinus syndrome: Secondary | ICD-10-CM | POA: Diagnosis not present

## 2016-01-27 MED ORDER — TEMAZEPAM 15 MG PO CAPS: 15.0000 mg | ORAL_CAPSULE | Freq: Every evening | ORAL | Status: DC | PRN

## 2016-01-27 NOTE — Patient Instructions (Signed)
You were given a prescription for temazepam for sleep. This is a very risky medication and we recommend trying to come of this medication under the supervision of a doctor. Regardless it should be prescribed by someone that sees you regularly, such as your PCP through the [place]. We would recommend other sleeping medications that are safer but you will need to decrease temazepam slowly over time to prevent withdrawal.

## 2016-01-27 NOTE — Progress Notes (Signed)
   Subjective:   [name] is a 82 y.o. male with a complex cardiac history (CABGx2, CAD, Pacemaker)  Here for  Chief Complaint  Patient presents with  . Medication Refill   #Sleep Disorder Currently on temazepam 15mg  nightly, has been taking for 15 years. He reports his cardiologist originally prescribed this medication. He "needs it" and knows "he is probably addicted to it" but he has run out of this medication. He reports this medication helps with his sleep and anxiety. He denies sleep walking, confusions or side effect from medications.   Health Maintenance Due  Topic Date Due  . ZOSTAVAX  03/03/1987  . PNA vac Low Risk Adult (2 of 2 - PCV13) 08/30/2006  . TETANUS/TDAP  11/15/2011    Review of Systems:   Per HPI. All other systems reviewed and are negative expect as in HPI   PMH, PSH, Medications, Allergies, SocialHx and FHx reviewed and updated in EMR- marked as reviewed 01/27/2016   Objective:  BP 133/69 mmHg  Pulse 79  Temp(Src) 98 F (36.7 C) (Oral)  Ht 5\' 10"  (1.778 m)  Wt 159 lb (72.122 kg)  BMI 22.81 kg/m2  SpO2 93%  Gen:  82 y.o. male in NAD. Speaking in full sentences. Good eye contact HEENT: NCAT, MMM CV: RR Resp: Normal WOB. Non-labored GI: Soft Ext: WWP, no edema MSK: Intact gait- using walker. Full ROM Neuro: Alert and oriented, speech normal Pysch: Normal mood and affect.       Chemistry      Component Value Date/Time   NA 136 01/16/2016 1559   K 4.5 01/16/2016 1559   CL 98 01/16/2016 1559   CO2 31 01/16/2016 1559   BUN 13 01/16/2016 1559   CREATININE 0.86 01/16/2016 1559   CREATININE 0.84 01/04/2016 0410      Component Value Date/Time   CALCIUM 10.0 01/16/2016 1559   ALKPHOS 83 01/16/2016 1559   AST 25 01/16/2016 1559   ALT 15 01/16/2016 1559   BILITOT 0.3 01/16/2016 1559      Lab Results  Component Value Date   HGBA1C 6.0* 04/10/2015   Assessment:     [name] is a 82 y.o. male here for benzodiazepiene refill   Plan:   #Sleep disorder/Benzo dependence: - Patient has been on benzo for quiet a while and would be at significant risk of withdrawal givne chronicity and frequency. I discussed with patient the risk of seizure with rapid discontinuation of this medication.  - I reviewed that this medication is not considered safe in populations > 65 and elevated BEERS criteria. I also discussed that there are safe alternatives - Patient has PCP at [facility] that he will ask about continuing to prescribe. I set the expectation that if the patient returns to [facility] we will taper off benzo and start trazodone.  - Provided interval refill  Temazepam  15mg  #30 tablets   > 50% of this 25 minute was spent discussing co-morbidities and risk of benzo therapy as well as available effective alternatives.   Caren Macadam, MD, ABFM 01/27/2016  3:31 PM

## 2016-01-28 NOTE — Progress Notes (Signed)
Remote pacemaker transmission.   

## 2016-02-10 LAB — CUP PACEART REMOTE DEVICE CHECK
Battery Impedance: 100 Ohm
Battery Voltage: 2.78 V
Brady Statistic AP VS Percent: 84 %
Brady Statistic AS VP Percent: 0 %
Implantable Lead Implant Date: 20160812
Implantable Lead Implant Date: 20160812
Implantable Lead Location: 753859
Implantable Lead Model: 5076
Lead Channel Impedance Value: 546 Ohm
Lead Channel Setting Pacing Amplitude: 2 V
Lead Channel Setting Pacing Pulse Width: 0.4 ms
MDC IDC LEAD LOCATION: 753860
MDC IDC LEAD MODEL: 1948
MDC IDC MSMT BATTERY REMAINING LONGEVITY: 149 mo
MDC IDC MSMT LEADCHNL RV IMPEDANCE VALUE: 650 Ohm
MDC IDC SESS DTM: 20170530132855
MDC IDC SET LEADCHNL RV PACING AMPLITUDE: 2.5 V
MDC IDC SET LEADCHNL RV SENSING SENSITIVITY: 4 mV
MDC IDC STAT BRADY AP VP PERCENT: 0 %
MDC IDC STAT BRADY AS VS PERCENT: 16 %

## 2016-02-17 ENCOUNTER — Encounter: Payer: Self-pay | Admitting: Cardiology

## 2016-02-18 ENCOUNTER — Encounter: Payer: Self-pay | Admitting: Internal Medicine

## 2016-02-18 ENCOUNTER — Ambulatory Visit (INDEPENDENT_AMBULATORY_CARE_PROVIDER_SITE_OTHER): Payer: Medicare Other | Admitting: Internal Medicine

## 2016-02-18 VITALS — BP 139/64 | HR 77 | Ht 70.0 in | Wt 160.0 lb

## 2016-02-18 DIAGNOSIS — D649 Anemia, unspecified: Secondary | ICD-10-CM | POA: Diagnosis not present

## 2016-02-18 DIAGNOSIS — G4700 Insomnia, unspecified: Secondary | ICD-10-CM | POA: Insufficient documentation

## 2016-02-18 LAB — CBC
HCT: 35.3 % — ABNORMAL LOW (ref 38.5–50.0)
Hemoglobin: 12 g/dL — ABNORMAL LOW (ref 13.2–17.1)
MCH: 30.8 pg (ref 27.0–33.0)
MCHC: 34 g/dL (ref 32.0–36.0)
MCV: 90.5 fL (ref 80.0–100.0)
MPV: 9 fL (ref 7.5–12.5)
PLATELETS: 316 10*3/uL (ref 140–400)
RBC: 3.9 MIL/uL — ABNORMAL LOW (ref 4.20–5.80)
RDW: 14.4 % (ref 11.0–15.0)
WBC: 6.9 10*3/uL (ref 3.8–10.8)

## 2016-02-18 MED ORDER — MIRTAZAPINE 7.5 MG PO TABS: 7.5000 mg | ORAL_TABLET | Freq: Every day | ORAL | Status: DC

## 2016-02-18 NOTE — Progress Notes (Signed)
   [name] Kerrin Mo, MD Phone: [phone]  Reason For Visit: Follow up for Anemia   # Normocytic Anemia  - Feeling  more fatigue lately. Close friends have died recently and daughter is currently in nursing home. Believes fatigue is more due to emotional stress of the past month  - Indicates lightheaded a couple of hours after taking IMDUR. Per cardiology notes and patient, cardiology aware, no current changes in medication for now.  - No blood in stools,  No significant changes in bowel movements, No significant weight loss.  - Take a multivitamin, not taking any iron pills currently. Has taken iron pills in the past.  However, not interested in restarting iron. States already takes to many pills.   # Sleep  -Has been taking Temazapam since 2002, patient goes right to sleep and gets good rest. Patient states it helps him with all his thoughts, as he often stays up at night with a very active mind otherwise.   - Patient needs to be weaned of Temazepam.    - Patient has used other over counter sleep aids including tylenol. Indicates that he things he has used melatonin in the past.   Past Medical History Reviewed problem list.  Medications- reviewed and updated No additions to family history Social history- patient is a non-smoker  Objective: BP 139/64 mmHg  Pulse 77  Ht 5\' 10"  (1.778 m)  Wt 160 lb (72.576 kg)  BMI 22.96 kg/m2 Gen: NAD, alert, cooperative with exam Neck: FROM, supple CV: RRR, good [id number], systolic murmur present  Resp: CTABL, no wheezes, non-labored  Assessment/Plan: See problem based a/p  Normocytic anemia Last hgb 11.9, patient very concerned about hgb being low. Patient was recently seen at the [facility] on June 2nd  and provide labs including iron studies. Per labs Iron 55, TIBC 377, no ferritin obtained. Based on these results patient's iron wnl. No symptoms of GI bleed, however hx of diverticulosis per patient with last colonoscopy  ([facility]). Upon review, could not find results of colonoscopy, patient states he has the report at home and will bring it in.  - Will recheck CBC today  - Will provide FOBT testing with instruction for patient to return these results - Patient to bring in results of last colonoscopy to next visit  - Could consider GI referral if FOBT results are positive    Insomnia Cardiology has been providing patient with sleep aid, temazepam 15 mg daily. However due to patients age and presence of medication on the BEERs list plan to transition patient over to another sleep aid.  - Will wean temazepam from 15 mg to 7.5 mg for two weeks and then stop completely. Once wean complete can start Remeron  - Discussed starting patient on melatonin following wean, patient not amenable to this. Therefore given hx of depression/anxiety. Will start patient on Remeron 7.5 mg as a sleep aid following temazepam wean.

## 2016-02-18 NOTE — Assessment & Plan Note (Addendum)
Last hgb 11.9, patient very concerned about hgb being low. Patient was recently seen at the [facility] on June 2nd  and provide labs including iron studies. Per labs Iron 55, TIBC 377, no ferritin obtained. Based on these results patient's iron wnl. No symptoms of GI bleed, however hx of diverticulosis per patient with last colonoscopy ([facility]). Upon review, could not find results of colonoscopy, patient states he has the report at home and will bring it in.  - Will recheck CBC today  - Will provide FOBT testing with instruction for patient to return these results - Patient to bring in results of last colonoscopy to next visit  - Could consider GI referral if FOBT results are positive

## 2016-02-18 NOTE — Assessment & Plan Note (Addendum)
Cardiology has been providing patient with sleep aid, temazepam 15 mg daily. However due to patients age and presence of medication on the BEERs list plan to transition patient over to another sleep aid.  - Will wean temazepam from 15 mg to 7.5 mg for two weeks and then stop completely. Once wean complete can start Remeron  - Discussed starting patient on melatonin following wean, patient not amenable to this. Therefore given hx of depression/anxiety. Will start patient on Remeron 7.5 mg as a sleep aid following temazepam wean.

## 2016-02-18 NOTE — Patient Instructions (Addendum)
Please collect and bring in stool cards. I will follow up with the result of your blood work. I will wean from temazepam, please take the lower dose of temazepam for two weeks. Once wean is complete then can start the low dose Remeron 7.5 mg at night for sleep.

## 2016-02-19 MED ORDER — TEMAZEPAM 7.5 MG PO CAPS: 7.5000 mg | ORAL_CAPSULE | Freq: Every evening | ORAL | Status: DC | PRN

## 2016-02-25 LAB — POC HEMOCCULT BLD/STL (HOME/3-CARD/SCREEN)
Card #3 Fecal Occult Blood, POC: NEGATIVE
FECAL OCCULT BLD: NEGATIVE
Fecal Occult Blood, POC: NEGATIVE

## 2016-02-25 NOTE — Addendum Note (Signed)
Addended by: [place] Pink on: 02/25/2016 09:27 AM   Modules accepted: Orders

## 2016-03-08 ENCOUNTER — Encounter: Payer: Self-pay | Admitting: Internal Medicine

## 2016-03-08 NOTE — Telephone Encounter (Signed)
Patient called again asking to speak with Dr. Emmaline Life regarding his most recent medication addition.  Will forward to MD to please call patient and advise on what he should do. Jamicia Haaland,CMA

## 2016-03-09 ENCOUNTER — Encounter: Payer: Self-pay | Admitting: Internal Medicine

## 2016-03-10 ENCOUNTER — Encounter: Payer: Self-pay | Admitting: Internal Medicine

## 2016-03-12 ENCOUNTER — Telehealth: Payer: Self-pay | Admitting: *Deleted

## 2016-03-12 MED ORDER — MIRTAZAPINE 7.5 MG PO TABS: 7.5000 mg | ORAL_TABLET | Freq: Every day | ORAL | Status: DC

## 2016-03-12 NOTE — Telephone Encounter (Signed)
Pt calling back about email he sent to the doctor. Please advise. Deseree Kennon Holter, CMA

## 2016-03-12 NOTE — Telephone Encounter (Signed)
Patient would like me to send in a prescription to [facility]. Will fax in prescription this afternoon.

## 2016-03-15 ENCOUNTER — Telehealth: Payer: Self-pay | Admitting: Internal Medicine

## 2016-03-15 NOTE — Telephone Encounter (Signed)
Called Mr. Allston to let him know the results of his hemoccult test were negative. I suggested that he follow up in the next couple of months with his PCPwith regards to his anemia to make sure his hemoglobin continues to trend up. Will hold on GI referral for the moment.

## 2016-03-16 ENCOUNTER — Telehealth: Payer: Self-pay | Admitting: *Deleted

## 2016-03-16 NOTE — Telephone Encounter (Signed)
Pt needs dr to refax rx  to nurse [phone]) attn: pamela clack.

## 2016-03-17 ENCOUNTER — Encounter: Payer: Self-pay | Admitting: Internal Medicine

## 2016-03-17 MED ORDER — MIRTAZAPINE 7.5 MG PO TABS: 7.5000 mg | ORAL_TABLET | Freq: Every day | ORAL | Status: DC

## 2016-03-17 NOTE — Telephone Encounter (Signed)
Provided rxn to staff for fax as specified.

## 2016-03-21 ENCOUNTER — Encounter: Payer: Self-pay | Admitting: Internal Medicine

## 2016-03-22 ENCOUNTER — Ambulatory Visit (INDEPENDENT_AMBULATORY_CARE_PROVIDER_SITE_OTHER): Payer: Medicare Other | Admitting: Family Medicine

## 2016-03-22 ENCOUNTER — Encounter: Payer: Self-pay | Admitting: Family Medicine

## 2016-03-22 VITALS — BP 147/75 | HR 83 | Temp 98.1°F | Wt 162.0 lb

## 2016-03-22 DIAGNOSIS — R6884 Jaw pain: Secondary | ICD-10-CM

## 2016-03-22 DIAGNOSIS — J029 Acute pharyngitis, unspecified: Secondary | ICD-10-CM | POA: Diagnosis not present

## 2016-03-22 LAB — POCT SEDIMENTATION RATE: POCT SED RATE: 41 mm/hr — AB (ref 0–22)

## 2016-03-22 LAB — CBC
HCT: 35.5 % — ABNORMAL LOW (ref 38.5–50.0)
HEMOGLOBIN: 11.9 g/dL — AB (ref 13.2–17.1)
MCH: 30.6 pg (ref 27.0–33.0)
MCHC: 33.5 g/dL (ref 32.0–36.0)
MCV: 91.3 fL (ref 80.0–100.0)
MPV: 8.8 fL (ref 7.5–12.5)
PLATELETS: 340 10*3/uL (ref 140–400)
RBC: 3.89 MIL/uL — AB (ref 4.20–5.80)
RDW: 14.6 % (ref 11.0–15.0)
WBC: 6.1 10*3/uL (ref 3.8–10.8)

## 2016-03-22 LAB — POCT RAPID STREP A (OFFICE): RAPID STREP A SCREEN: POSITIVE — AB

## 2016-03-22 LAB — C-REACTIVE PROTEIN: CRP: 3.4 mg/dL — AB (ref <0.60)

## 2016-03-22 MED ORDER — AMOXICILLIN 500 MG PO CAPS: 1000.0000 mg | ORAL_CAPSULE | Freq: Every day | ORAL | 0 refills | Status: DC

## 2016-03-22 NOTE — Patient Instructions (Addendum)
It was a pleasure seeing you today in our clinic. Today we discussed your sore throat and ear pain. Here is the treatment plan we have discussed and agreed upon together:   - Take the amoxicillin as prescribed until the bottle is empty - Take Tylenol for any pain/discomfort. - Make sure to stay well-hydrated. - We obtain some labs today to help look into some more serious long-term conditions.

## 2016-03-22 NOTE — Progress Notes (Signed)
SORE THROAT 2 weeks ago. Started w/ sorethroat. Then migrated to ear pain and left parietal pain. Movement of the TMJ makes worse. Pain is Achy, occasionally sharp. No fever. Some cough -- productive. No CP, SOB, N/V/D. No recent headache or vision changes.  Pain is: achy Severity: 9 at its worst; 6 now Medications tried: mucinex Strep throat exposure: none STD exposure: no  Symptoms Fever: no Cough: yes Runny nose: no Muscle aches: left neck Swollen Glands: left ant cervical Trouble breathing: no Drooling: no Weight loss: no   Review of Symptoms - see HPI PMH - Smoking status noted.    CC, SH/smoking status, and VS noted  Objective: BP (!) 147/75   Pulse 83   Temp 98.1 F (36.7 C) (Oral)   Wt 162 lb (73.5 kg)   SpO2 95%   BMI 23.24 kg/m  Gen: NAD, alert, cooperative, and pleasant. HEENT: NCAT, EOMI, PERRL, TMs clear, OP erythematous without exudate, no LAD, voice hoarse. No reproducible tenderness of the parietal scalp with palpation  CV: RRR, no murmur Resp: CTAB, no wheezes, non-labored Ext: No edema, warm Neuro: Alert and oriented, Speech clear, No gross deficits  Assessment and plan:  Sore throat Patient is here today with complaints of a sore throat. Rapid strep today was positive. There is also some complaints of some jaw pain and some sharp pain over the parietal aspect of his left scalp. There was some concern for possible temporal arteritis due to the pain he is experiencing of his parietal scalp.  - ESR and CRP pending - CBC pending - Rapid strep positive with symptomatic sore throat. Will treat with amoxicillin. - I have asked patient to follow-up in our clinic if pain has not improved after completion of the course of antibiotics.   Orders Placed This Encounter  Procedures  . CBC  . C-reactive protein  . Rapid Strep A  . POCT SEDIMENTATION RATE    Meds ordered this encounter  Medications  . amoxicillin (AMOXIL) 500 MG capsule    Sig: Take 2  capsules (1,000 mg total) by mouth daily.    Dispense:  20 capsule    Refill:  0     Elberta Leatherwood, MD,MS,  PGY3 03/22/2016 5:55 PM

## 2016-03-22 NOTE — Assessment & Plan Note (Signed)
Patient is here today with complaints of a sore throat. Rapid strep today was positive. There is also some complaints of some jaw pain and some sharp pain over the parietal aspect of his left scalp. There was some concern for possible temporal arteritis due to the pain he is experiencing of his parietal scalp.  - ESR and CRP pending - CBC pending - Rapid strep positive with symptomatic sore throat. Will treat with amoxicillin. - I have asked patient to follow-up in our clinic if pain has not improved after completion of the course of antibiotics.

## 2016-03-30 DIAGNOSIS — R7881 Bacteremia: Secondary | ICD-10-CM

## 2016-03-30 HISTORY — DX: Bacteremia: R78.81

## 2016-04-05 ENCOUNTER — Ambulatory Visit (INDEPENDENT_AMBULATORY_CARE_PROVIDER_SITE_OTHER): Payer: Medicare Other | Admitting: Family Medicine

## 2016-04-05 ENCOUNTER — Encounter: Payer: Self-pay | Admitting: Family Medicine

## 2016-04-05 VITALS — BP 148/73 | HR 87 | Temp 98.4°F | Ht 70.0 in | Wt 161.0 lb

## 2016-04-05 DIAGNOSIS — R51 Headache: Secondary | ICD-10-CM | POA: Diagnosis not present

## 2016-04-05 DIAGNOSIS — J029 Acute pharyngitis, unspecified: Secondary | ICD-10-CM

## 2016-04-05 DIAGNOSIS — G8929 Other chronic pain: Secondary | ICD-10-CM

## 2016-04-05 DIAGNOSIS — R519 Headache, unspecified: Secondary | ICD-10-CM

## 2016-04-05 MED ORDER — AMOXICILLIN-POT CLAVULANATE 875-125 MG PO TABS: 1.0000 | ORAL_TABLET | Freq: Two times a day (BID) | ORAL | 0 refills | Status: DC

## 2016-04-05 MED ORDER — PREDNISONE 20 MG PO TABS: 60.0000 mg | ORAL_TABLET | Freq: Every day | ORAL | 0 refills | Status: DC

## 2016-04-05 NOTE — Assessment & Plan Note (Signed)
Patient presenting with mild intermittent headache that is temporally located. His CRP is mildly elevated. Given his tenderness to palpation over the temporal region and decreased visual acuity (we do not have a baseline), I am concerned about temporal arteritis. I dicussed this with my attending, Dr. Mingo Amber. Given the concern, will go ahead and start prednisone 22m daily (no rheumatologist on call to discusse). [facility] He recommended repeating ESR and CRP (unfrotunately patient unable to get back to clinic in time). He noted if the patient has seen an ophthalmologist in the past he should follow up with them, otherwise the patient should show up at their office at 8:30. Discussed with patient and his wife, he has [place] [facility] Provided address and phone number in case he cannot make it. Patient also referred urgently to vascular surgery in case he needs a temporal artery biopsy. Discussed return precautions.

## 2016-04-05 NOTE — Assessment & Plan Note (Signed)
Patient diagnosed with strep throat previously. He felt better after amoxicillin but then got acutely worse. No evidence of peritonsillar abscess on exam. He is well appearing.  - given double sickening with start Augmentin. - discussed return precautions such as dysphagia, drooling, worsening sore throat, difficulty breathing.

## 2016-04-05 NOTE — Patient Instructions (Signed)
Given your new fevers, I have prescribed you Augmentin, it is a stronger version of the amoxicillin If you note difficulty breathing, drooling, or difficulty swallowing to seek care immediately Because of your headache and the elevated sedimentation rate, I have referred you to vascular surgery. I would like for them to rule out temporal arteritis  Please follow up in our clinic on Wednesday or sooner as needed.

## 2016-04-05 NOTE — Progress Notes (Signed)
Subjective: CC: "not feeling well" HPI: Patient is a 82 y.o. male with a past medical history of strep A diagnosed recently presenting to clinic today for a same day appt for a 2 week f/u on his sore.  Patient completed amoxicllin Thursday and felt better. On Friday he started having temperature up to 101.8. His appetite started being poor yesterday. He notes sore throat up, with pain in his jaw and up through his left ear. Mild cough. Mild SOB that started months ago but is stable. He denies drooling, difficulty swallowing, stridor, chest pain.   He took Tylenol which helped his fever.   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  From looking at previous notes, it looks like they were worried about temporally located headaches on 7/24. He continues to have some mild headache in the the left temporal region. He notes tenderness to palpation and sometimes has a "sharp" pain that radiates forward. He has not noted any vision change.   Social History: never smoker  ROS: All other systems reviewed and are negative.    Past Medical History Patient Active Problem List   Diagnosis Date Noted  . Headache 04/05/2016  . Sore throat 03/22/2016  . Insomnia 02/18/2016  . Pacemaker 12/25/2015  . Atrial flutter, RVR 04/10/2015  . Unstable angina ([place]) 04/10/2015  . CAD (coronary artery disease), previous stents to mLAD,  04/10/2015  . Urinary retention due to benign prostatic hyperplasia   . Systolic CHF ([place]) 16/05/9603  . Depression 09/26/2013  . Intertrochanteric fracture of right hip ([place]) 08/29/2013  . Hypertension   . GERD (gastroesophageal reflux disease)   . COPD (chronic obstructive pulmonary disease) ([place])   . H/O cardiac arrest   . Junctional bradycardia 07/25/2012  . Glucose intolerance (impaired glucose tolerance) 07/19/2012  . Normocytic  anemia 07/19/2012  . Acute MI, anterolateral wall ([place]) 07/16/2012  . Prostatitis 07/16/2012  . E. coli UTI (urinary tract infection) 07/16/2012  . Elevated transaminase level 07/16/2012  . Foot drop, right 07/28/2011  . LBBB (left bundle branch block) 03/29/2011  . Hx of CABG and re-do CABG  03/29/2011  . Ischemic heart disease   . Myocardial infarction ([place])   . Acute systolic congestive heart failure, NYHA class 2-EF 40-45%   . Chronic respiratory failure ([place])   . Chronic asthma   . Peripheral neuropathy ([place])   . Dyslipidemia (high LDL; low HDL)   . Essential hypertension 04/04/2007  . PSORIASIS 04/04/2007  . OSTEOARTHRITIS 04/04/2007  . OSTEOPOROSIS 04/04/2007  . Cor Athrscl-Uns Vessel 01/05/2007  . HERNIA 01/05/2007    Medications- reviewed and updated  Objective: Office vital signs reviewed. BP (!) 148/73   Pulse 87   Temp 98.4 F (36.9 C) (Oral)   Ht _0  (1.778 m)   Wt 161 lb (73 kg)   BMI 23.10 kg/m    Physical Examination:  General: Awake, alert, well- nourished, NAD ENMT:  TMs intact, normal light reflex, no erythema, no bulging. Nasal turbinates moist. MMM, Oropharynx clear without erythema or tonsillar exudate/hypertrophy. Uvula midline. No asymmetry or evidence of peritonsillar abscess.  Eyes: Conjunctiva non-injected. PERRL.  Tenderness to palpation over the left temporal region.  Cardio: RRR, no m/r/g noted.  Pulm: No increased WOB.  CTAB, without wheezes, rhonchi or crackles noted.     Visual Acuity  Screening   Right eye Left eye Both eyes  Without correction:     With correction: 20/70 20/100 20/70   CRP 7/24: 3.4 ESR 7/25: 41    Assessment/Plan: Headache Patient presenting with mild intermittent headache that is temporally located. His CRP is mildly elevated. Given his tenderness to palpation over the temporal region and decreased visual acuity (we do not have a baseline), I am concerned about temporal arteritis. I dicussed this with my  attending, Dr. Mingo Amber. Given the concern, will go ahead and start prednisone 31m daily (no rheumatologist on call to discusse). [facility] He recommended repeating ESR and CRP (unfrotunately patient unable to get back to clinic in time). He noted if the patient has seen an ophthalmologist in the past he should follow up with them, otherwise the patient should show up at their office at 8:30. Discussed with patient and his wife, [place] [facility]. Provided address and phone number in case he cannot make it. Patient also referred urgently to vascular surgery in case he needs a temporal artery biopsy. Discussed return precautions.  Sore throat Patient diagnosed with strep throat previously. He felt better after amoxicillin but then got acutely worse. No evidence of peritonsillar abscess on exam. He is well appearing.  - given double sickening with start Augmentin. - discussed return precautions such as dysphagia, drooling, worsening sore throat, difficulty breathing.    Orders Placed This Encounter  Procedures  . Ambulatory referral to Vascular Surgery    Referral Priority:   Urgent    Referral Type:   Surgical    Referral Reason:   Specialty Services Required    Requested Specialty:   Vascular Surgery    Number of Visits Requested:   1    Meds ordered this encounter  Medications  . predniSONE (DELTASONE) 20 MG tablet    Sig: Take 3 tablets (60 mg total) by mouth daily with breakfast.    Dispense:  90 tablet    Refill:  0  . amoxicillin-clavulanate (AUGMENTIN) 875-125 MG tablet    Sig: Take 1 tablet by mouth 2 (two) times daily.    Dispense:  14 tablet    Ref[place]:  [id number] [place]

## 2016-04-08 ENCOUNTER — Telehealth: Payer: Self-pay | Admitting: Cardiology

## 2016-04-08 ENCOUNTER — Encounter: Payer: Medicare Other | Admitting: Vascular Surgery

## 2016-04-08 NOTE — Telephone Encounter (Signed)
Want to stop Plavix and ASA today for temperal artery bx on next Wednesday.   Please call back today to discuss.

## 2016-04-08 NOTE — Telephone Encounter (Signed)
Ok to hold asa and plavix; resume following procedure [name]

## 2016-04-08 NOTE — Telephone Encounter (Signed)
Surgical Clearance requested:  Procedure: temporal artery biopsy  Date scheduled: 04/14/16  MD: Dr. Valetta Close   Meds to hold: Plavix and ASA starting today 8/10 per office  Phone #: [phone]  Fax: [phone]  Last OV: 11/2015 Next OV: 04/26/2016

## 2016-04-08 NOTE — Telephone Encounter (Signed)
Surgical clearance faxed to [facility] @ [phone]

## 2016-04-21 ENCOUNTER — Encounter: Payer: Self-pay | Admitting: Family Medicine

## 2016-04-22 ENCOUNTER — Encounter: Payer: Self-pay | Admitting: Family Medicine

## 2016-04-22 ENCOUNTER — Telehealth: Payer: Self-pay | Admitting: *Deleted

## 2016-04-22 NOTE — Progress Notes (Signed)
HPI: FU CAD. He had CABG in 1986. He had a redo CABG in 1993. He had PCI in 2008 and 2010. He presented to the emergency room on 07/15/12 after falling at home. While in the emergency room he developed chest pain followed by ventricular tachycardia requiring cardioversion. He was taken emergently to the cath lab and treated with a BMS to the mid LAD. 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 Also with h/o atrial flutter; not anticoagulated due to falls and anemia. Last cardiac catheterization November 2013 showed a 50% ostial left main. The LAD was occluded. There was a 90% lesion in the mid LAD past the insertion of the LIMA which was patent. There was a 50-60% left circumflex. Right coronary artery was occluded. Saphenous vein graft to the obtuse marginal and saphenous vein graft to diagonal occluded. RIMA to the RCA patent. Ejection fraction 50%; patient had PCI of his LAD at that time. Last echocardiogram January 2016 showed normal LV systolic function,Grade 1 diastolic dysfunction, moderate left atrial enlargement and trace mitral regurgitation.  Nuclear study January 2016 showed ejection fraction 58%. There was a lateral infarct but no ischemia.  Since last seen, The patient has been evaluated by family practice recently for sore throat and apparently was strep positive. He then developed headaches and he has had recurrent fevers and chills. He has had a temporal artery biopsy with results pending. He was placed on prednisone by primary care. He did have dental work approximately 5 weeks ago. He has occasional indigestion but no other chest pain. Mild dyspnea on exertion which is new. No orthopnea, PND or pedal edema. He does describe some fatigue.  Current Outpatient Prescriptions  Medication Sig Dispense Refill  . acetaminophen (TYLENOL) 500 MG tablet Take 500 mg by mouth at bedtime. Take at bedtime every night per patient    . albuterol (PROVENTIL HFA;VENTOLIN HFA) 108  (90 Base) MCG/ACT inhaler Inhale 2 puffs into the lungs every 6 (six) hours as needed for wheezing or shortness of breath. 1 Inhaler 0  . aspirin EC 81 MG EC tablet Take 1 tablet (81 mg total) by mouth daily.    [name] atorvastatin (LIPITOR) 40 MG tablet Take 40 mg by mouth daily at 6 PM.    . budesonide-formoterol (SYMBICORT) 80-4.5 MCG/ACT inhaler Inhale 2 puffs into the lungs 2 (two) times daily.    . calcium-vitamin D (OSCAL WITH D) 500-200 MG-UNIT per tablet Take 2 tablets by mouth daily.    . carbamazepine (TEGRETOL) 100 MG chewable tablet Chew 300 mg by mouth 2 (two) times daily.    . clopidogrel (PLAVIX) 75 MG tablet Take 1 tablet (75 mg total) by mouth daily.    [name] docusate sodium (COLACE) 100 MG capsule Take 100 mg by mouth daily.    . finasteride (PROSCAR) 5 MG tablet Take 5 mg by mouth daily.     . furosemide (LASIX) 40 MG tablet Take 40 mg by mouth every morning.    [name] ipratropium (ATROVENT HFA) 17 MCG/ACT inhaler Inhale 2 puffs into the lungs 2 (two) times daily as needed for wheezing.     . isosorbide mononitrate (IMDUR) 60 MG 24 hr tablet Take 90 mg by mouth daily.     . metoprolol succinate (TOPROL-XL) 12.5 mg TB24 24 hr tablet Take 12.5 mg by mouth daily.    . Multiple Vitamins-Minerals (MULTIVITAMIN WITH MINERALS) tablet Take 1 tablet by mouth every evening.     [name]  NITROSTAT 0.4 MG SL tablet PLACE 1 TABLET UNDER THE TONGUE EVERY 5 MINUTES ASDIRECTED BY PHYSICIAN 25 tablet 3  . potassium chloride SA (K-DUR,KLOR-CON) 20 MEQ tablet Take 20 mEq by mouth daily.     . predniSONE (DELTASONE) 20 MG tablet Take 3 tablets (60 mg total) by mouth daily with breakfast. 90 tablet 0  . predniSONE (STERAPRED UNI-PAK 21 TAB) 10 MG (21) TBPK tablet Take as directed. 21 tablet 0  . ranitidine (ZANTAC) 150 MG tablet TAKE 1 TABLET BY MOUTH 2 TIMES DAILY. 180 tablet 3  . senna (SENOKOT) 8.6 MG tablet Take 1 tablet by mouth daily.     . sertraline (ZOLOFT) 100 MG tablet Take 150 mg by mouth See admin  instructions. 1 AND 1/2 TABLET BY MOUTH DAILY    . tamsulosin (FLOMAX) 0.4 MG CAPS capsule Take 1 capsule (0.4 mg total) by mouth daily. 30 capsule 0   No current facility-administered medications for this visit.      Past Medical History:  Diagnosis Date  . Anemia 2014   was on iron pills and then was able to come off  . Arthritis    "all over"  . Asthma       . Atrial flutter ([place])    a. diagnosed 03/2015  . Complication of anesthesia    B/P dropped low and had to stay in recovery longer  . Coronary artery disease    a. s/p CABG 1986 b. redo CABG 1993 c. PTCA 2008 d. DES 2010  . Depression       . Diverticulosis   . Dyslipidemia (high LDL; low HDL)       . GERD (gastroesophageal reflux disease)       . Hepatitis    hepatitis- long time ago, occupational contamination   . Hiatal hernia   . Hyperlipemia   . Hypertension       . Insomnia       . Kidney stones   . On home oxygen therapy    "2L; just at night" (01/31/2015)  . Peripheral neuropathy ([place])   . Peripheral neuropathy ([place])       . Peripheral vascular disease (HCC)    thrombus- in leg- many yrs. ago- ?R- coumadin x1 mth  . Pneumonia 1960's  . Prostate cancer [facility])    radiation therapy- 2002  . Prostatitis   . Systolic CHF ([place])       . VT (ventricular tachycardia) ([place]) 06/2012   a. in the setting of STEMI b. on amio during hospitalization    Past Surgical History:  Procedure Laterality Date  . APPENDECTOMY  1943  . CATARACT EXTRACTION W/ INTRAOCULAR LENS  IMPLANT, BILATERAL    . COLONOSCOPY    . CORONARY ARTERY BYPASS GRAFT  1986;  1993   CABG X 5; CABG X 1  . CYSTOSCOPY W/ URETERAL STENT PLACEMENT Right 02/28/2013   Procedure: CYSTOSCOPY WITH RETROGRADE PYELOGRAM/URETERAL STENT PLACEMENT;  Surgeon: Dutch Gray, MD;  Location: [facility];  Service: Urology;  Laterality: Right;  . CYSTOSCOPY WITH URETEROSCOPY AND STENT PLACEMENT Right 03/23/2013   Procedure: RIGHT URETEROSCOPY, LASER LITHO AND STENT  PLACEMENT;  Surgeon: Fredricka Bonine, MD;  Location: [facility];  Service: Urology;  Laterality: Right;  . EP IMPLANTABLE DEVICE N/A 04/11/2015   Procedure: Pacemaker Implant;  Surgeon: Will Meredith Leeds, MD;  Location: [place] CV LAB;  Service: Cardiovascular;  Laterality: N/A;  . FOOT FUSION Left 2000   heel  . FRACTURE SURGERY  heel- crushed -2002,  (hardware)   . HAMMER TOE SURGERY Right    little toe  . HARDWARE REMOVAL Right 2002   great toe; Dr. Sharol Given  . HOLMIUM LASER APPLICATION Right [id number]   Procedure: HOLMIUM LASER APPLICATION;  Surgeon: Fredricka Bonine, MD;  Location: [facility];  Service: Urology;  Laterality: Right;  . INGUINAL HERNIA REPAIR Left 01/31/2015   Procedure: OPEN REPAIR RECURRENT LEFT INGUINAL HERNIA;  Surgeon: Fanny Skates, MD;  Location: [place];  Service: General;  Laterality: Left;  . INSERTION OF MESH Left 01/31/2015   Procedure: INSERTION OF MESH;  Surgeon: Fanny Skates, MD;  Location: [place];  Service: General;  Laterality: Left;  . INTRAMEDULLARY (IM) NAIL INTERTROCHANTERIC Right 08/29/2013   Procedure: INTRAMEDULLARY (IM) NAIL INTERTROCHANTRIC;  Surgeon: Marianna Payment, MD;  Location: [place];  Service: Orthopedics;  Laterality: Right;  . JOINT REPLACEMENT     R great toe  . KNEE ARTHROSCOPY Right X 2  . LEFT AND RIGHT HEART CATHETERIZATION WITH CORONARY ANGIOGRAM N/A 07/15/2012   Procedure: LEFT AND RIGHT HEART CATHETERIZATION WITH CORONARY ANGIOGRAM;  Surgeon: Jettie Booze, MD;  Location: [facility] CATH LAB;  Service: Cardiovascular;  Laterality: N/A;  . LUMBAR LAMINECTOMY/DECOMPRESSION MICRODISCECTOMY  08/10/2011   Procedure: LUMBAR LAMINECTOMY/DECOMPRESSION MICRODISCECTOMY;  Surgeon: Cooper Render Pool;  Location: [place] [facility];  Service: Neurosurgery;  Laterality: Right;  RIGHT Lumbar five-sacral one LAMINECTOMY, MICRODISCECTOMY  . LUMBAR WOUND DEBRIDEMENT  08/22/2011   Procedure: LUMBAR WOUND DEBRIDEMENT;  Surgeon: Eustace Moore;   Location: [place] NEURO ORS;  Service: Neurosurgery;  Laterality: N/A;  Repair of CSF Leak requiring laminectomy  . PERCUTANEOUS CORONARY STENT INTERVENTION (PCI-S) N/A 07/15/2012   Procedure: PERCUTANEOUS CORONARY STENT INTERVENTION (PCI-S);  Surgeon: Jettie Booze, MD;  [facility] CATH LAB;  Service: Cardiovascular;  Laterality: N/A;  . RHINOPLASTY  [id number]  . TONSILLECTOMY  1940  . TRANSURETHRAL RESECTION OF PROSTATE  x2    Social History   Social History  . Marital status: Married    Spouse name: N/A  . Number of children: N/A  . Years of education: N/A   Occupational History  . Not on file.   Social History Main Topics  . Smoking status: Never Smoker  . Smokeless tobacco: Never Used  . Alcohol use No  . Drug use: No  . Sexual activity: Not on file   Other Topics Concern  . Not on file   Social History Narrative  . No narrative on file    Family History  Problem Relation Age of Onset  . Heart attack Mother   . Addison's disease Father   . Heart attack Sister   . Anesthesia problems Neg Hx   . Hypotension Neg Hx   . Malignant hyperthermia Neg Hx   . Pseudochol deficiency Neg Hx     ROS: Positive fevers and chills as well as general malaise but no productive cough, hemoptysis, dysphasia, odynophagia, melena, hematochezia, dysuria, hematuria, rash, seizure activity, orthopnea, PND, pedal edema, claudication. Remaining systems are negative.  Physical Exam: Well-developed well-nourished in no acute distress.  Skin is warm and dry.  HEENT Status post biopsy of left temporal artery Neck is supple.  Chest is clear to auscultation with normal expansion. Pacemaker site without erythema. Cardiovascular exam is regular rate and rhythm. 2/6 systolic murmur left sternal border and apex. Abdominal exam nontender or distended. No masses palpated. Extremities show no edema. No peripheral stigmata of SBE. neuro grossly intact   A/P  1  Fever-etiology unclear. Workup  is ongoing per primary care. He has already undergone biopsy of temporal artery and is on prednisone. He did have dental work 5 weeks prior to this visit. He has a mitral regurgitation murmur on examination. I am not convinced that he has endocarditis but would be worthwhile to exclude. Check blood cultures and echocardiogram.  2 Coronary artery disease-continue aspirin and statin.  3 hypertension-Blood pressure is mildly elevated. However he has a history of orthostatic syncope. I will not advance medications as I am afraid this would make his orthostatic symptoms worse.  4 hyperlipidemia-continue statin.  5 pacemaker-followed by electrophysiology.  6 history of atrial flutter-not on anticoagulation given history of falls and syncope. Continue beta blocker.  7 syncope-no recent episodes.  [name], MD

## 2016-04-22 NOTE — Telephone Encounter (Signed)
[name] from [facility] called to speak with [name] who referred patient to be assessed for Giant Cell Arteritis. We were not able to reach Dr. Dr. Lorenso Perez so he decided to speak with the preceptor.  He stated he did temporal artery biopsy which was negative for GCA. Patient was already started on high dose steroid ( 60 mg prednisone) by his PCP/Dorsey, ESR was elevated during previous assessment in patient with headache.  [name] recommended 1. Tapering off the steroid. 2. Recheck ESR soon. 3. If ESR is still elevated to evaluate for other causes of elevated ESR.  I called to talk with patient and schedule follow up with PCP/Dorsey but he did not pick up. Message left for him to call back.  Will forward note to PCP and [name] to follow up with patient.  [name]. Please contact patient to schedule follow up with PCP ( [name]) or [name] in the next few days. Thanks.

## 2016-04-22 NOTE — Telephone Encounter (Signed)
Heather from [facility] called requesting Dr. Lorenso Courier give Dr. Jola Schmidt a call regarding patient's office visit with them. Please call [phone].  Will forward to Dr. Lorenso Courier and PCP.  Derl Barrow, RN

## 2016-04-26 ENCOUNTER — Other Ambulatory Visit: Payer: Self-pay | Admitting: Cardiology

## 2016-04-26 ENCOUNTER — Encounter: Payer: Self-pay | Admitting: Cardiology

## 2016-04-26 ENCOUNTER — Ambulatory Visit (INDEPENDENT_AMBULATORY_CARE_PROVIDER_SITE_OTHER): Payer: Medicare Other | Admitting: Cardiology

## 2016-04-26 VITALS — BP 148/80 | HR 89 | Ht 70.0 in | Wt 159.0 lb

## 2016-04-26 DIAGNOSIS — I10 Essential (primary) hypertension: Secondary | ICD-10-CM

## 2016-04-26 DIAGNOSIS — R011 Cardiac murmur, unspecified: Secondary | ICD-10-CM

## 2016-04-26 DIAGNOSIS — I2510 Atherosclerotic heart disease of native coronary artery without angina pectoris: Secondary | ICD-10-CM

## 2016-04-26 DIAGNOSIS — R509 Fever, unspecified: Secondary | ICD-10-CM | POA: Diagnosis not present

## 2016-04-26 NOTE — Patient Instructions (Signed)
Medication Instructions:   NO CHANGE  Labwork: Your physician recommends that you return for lab work in: [place].   Testing/Procedures: Your physician has requested that you have an echocardiogram. Echocardiography is a painless test that uses sound waves to create images of your heart. It provides your doctor with information about the size and shape of your heart and how well your heart's chambers and valves are working. This procedure takes approximately one hour. There are no restrictions for this procedure.    Follow-Up: Your physician recommends that you schedule a follow-up appointment in: [place].   If you need a refill on your cardiac medications before your next appointment, please call your pharmacy.

## 2016-04-27 ENCOUNTER — Telehealth: Payer: Self-pay | Admitting: Cardiology

## 2016-04-27 ENCOUNTER — Inpatient Hospital Stay (HOSPITAL_COMMUNITY): Payer: Medicare Other

## 2016-04-27 ENCOUNTER — Encounter (HOSPITAL_COMMUNITY): Payer: Self-pay | Admitting: Nurse Practitioner

## 2016-04-27 ENCOUNTER — Inpatient Hospital Stay (HOSPITAL_COMMUNITY)
Admission: AD | Admit: 2016-04-27 | Discharge: 2016-05-02 | DRG: 441 | Disposition: A | Discharge status: Home Health | Payer: Medicare Other | Source: Ambulatory Visit | Attending: Family Medicine | Admitting: Family Medicine

## 2016-04-27 ENCOUNTER — Ambulatory Visit: Payer: Medicare Other | Admitting: Family Medicine

## 2016-04-27 DIAGNOSIS — B9689 Other specified bacterial agents as the cause of diseases classified elsewhere: Secondary | ICD-10-CM | POA: Diagnosis present

## 2016-04-27 DIAGNOSIS — Z955 Presence of coronary angioplasty implant and graft: Secondary | ICD-10-CM

## 2016-04-27 DIAGNOSIS — M21371 Foot drop, right foot: Secondary | ICD-10-CM | POA: Diagnosis present

## 2016-04-27 DIAGNOSIS — Z950 Presence of cardiac pacemaker: Secondary | ICD-10-CM | POA: Diagnosis not present

## 2016-04-27 DIAGNOSIS — I5022 Chronic systolic (congestive) heart failure: Secondary | ICD-10-CM | POA: Diagnosis present

## 2016-04-27 DIAGNOSIS — K838 Other specified diseases of biliary tract: Secondary | ICD-10-CM

## 2016-04-27 DIAGNOSIS — K219 Gastro-esophageal reflux disease without esophagitis: Secondary | ICD-10-CM | POA: Diagnosis present

## 2016-04-27 DIAGNOSIS — M810 Age-related osteoporosis without current pathological fracture: Secondary | ICD-10-CM | POA: Diagnosis present

## 2016-04-27 DIAGNOSIS — M316 Other giant cell arteritis: Secondary | ICD-10-CM | POA: Diagnosis present

## 2016-04-27 DIAGNOSIS — R7989 Other specified abnormal findings of blood chemistry: Secondary | ICD-10-CM | POA: Diagnosis not present

## 2016-04-27 DIAGNOSIS — I110 Hypertensive heart disease with heart failure: Secondary | ICD-10-CM | POA: Diagnosis present

## 2016-04-27 DIAGNOSIS — I340 Nonrheumatic mitral (valve) insufficiency: Secondary | ICD-10-CM | POA: Diagnosis not present

## 2016-04-27 DIAGNOSIS — D649 Anemia, unspecified: Secondary | ICD-10-CM | POA: Diagnosis present

## 2016-04-27 DIAGNOSIS — Z7902 Long term (current) use of antithrombotics/antiplatelets: Secondary | ICD-10-CM

## 2016-04-27 DIAGNOSIS — I2510 Atherosclerotic heart disease of native coronary artery without angina pectoris: Secondary | ICD-10-CM | POA: Diagnosis present

## 2016-04-27 DIAGNOSIS — I10 Essential (primary) hypertension: Secondary | ICD-10-CM

## 2016-04-27 DIAGNOSIS — I4892 Unspecified atrial flutter: Secondary | ICD-10-CM

## 2016-04-27 DIAGNOSIS — Z6821 Body mass index (BMI) 21.0-21.9, adult: Secondary | ICD-10-CM

## 2016-04-27 DIAGNOSIS — D379 Neoplasm of uncertain behavior of digestive organ, unspecified: Secondary | ICD-10-CM | POA: Diagnosis not present

## 2016-04-27 DIAGNOSIS — Z7952 Long term (current) use of systemic steroids: Secondary | ICD-10-CM

## 2016-04-27 DIAGNOSIS — N400 Enlarged prostate without lower urinary tract symptoms: Secondary | ICD-10-CM

## 2016-04-27 DIAGNOSIS — I252 Old myocardial infarction: Secondary | ICD-10-CM

## 2016-04-27 DIAGNOSIS — E785 Hyperlipidemia, unspecified: Secondary | ICD-10-CM | POA: Diagnosis present

## 2016-04-27 DIAGNOSIS — R945 Abnormal results of liver function studies: Secondary | ICD-10-CM | POA: Diagnosis not present

## 2016-04-27 DIAGNOSIS — K831 Obstruction of bile duct: Secondary | ICD-10-CM | POA: Diagnosis present

## 2016-04-27 DIAGNOSIS — D135 Benign neoplasm of extrahepatic bile ducts: Secondary | ICD-10-CM | POA: Diagnosis present

## 2016-04-27 DIAGNOSIS — Z79899 Other long term (current) drug therapy: Secondary | ICD-10-CM

## 2016-04-27 DIAGNOSIS — R7881 Bacteremia: Secondary | ICD-10-CM | POA: Diagnosis not present

## 2016-04-27 DIAGNOSIS — J4520 Mild intermittent asthma, uncomplicated: Secondary | ICD-10-CM | POA: Diagnosis present

## 2016-04-27 DIAGNOSIS — K8309 Other cholangitis: Secondary | ICD-10-CM

## 2016-04-27 DIAGNOSIS — G629 Polyneuropathy, unspecified: Secondary | ICD-10-CM | POA: Diagnosis present

## 2016-04-27 DIAGNOSIS — E440 Moderate protein-calorie malnutrition: Secondary | ICD-10-CM | POA: Diagnosis present

## 2016-04-27 DIAGNOSIS — K830 Cholangitis: Secondary | ICD-10-CM | POA: Diagnosis present

## 2016-04-27 DIAGNOSIS — Z96698 Presence of other orthopedic joint implants: Secondary | ICD-10-CM | POA: Diagnosis present

## 2016-04-27 DIAGNOSIS — Z951 Presence of aortocoronary bypass graft: Secondary | ICD-10-CM

## 2016-04-27 DIAGNOSIS — R509 Fever, unspecified: Secondary | ICD-10-CM

## 2016-04-27 DIAGNOSIS — F329 Major depressive disorder, single episode, unspecified: Secondary | ICD-10-CM | POA: Diagnosis present

## 2016-04-27 DIAGNOSIS — J449 Chronic obstructive pulmonary disease, unspecified: Secondary | ICD-10-CM | POA: Diagnosis present

## 2016-04-27 DIAGNOSIS — Z8546 Personal history of malignant neoplasm of prostate: Secondary | ICD-10-CM

## 2016-04-27 DIAGNOSIS — Z8674 Personal history of sudden cardiac arrest: Secondary | ICD-10-CM

## 2016-04-27 DIAGNOSIS — K5900 Constipation, unspecified: Secondary | ICD-10-CM | POA: Diagnosis not present

## 2016-04-27 DIAGNOSIS — Z7951 Long term (current) use of inhaled steroids: Secondary | ICD-10-CM

## 2016-04-27 DIAGNOSIS — D490 Neoplasm of unspecified behavior of digestive system: Secondary | ICD-10-CM

## 2016-04-27 DIAGNOSIS — R0609 Other forms of dyspnea: Secondary | ICD-10-CM

## 2016-04-27 DIAGNOSIS — Z7982 Long term (current) use of aspirin: Secondary | ICD-10-CM

## 2016-04-27 DIAGNOSIS — R5383 Other fatigue: Secondary | ICD-10-CM | POA: Diagnosis present

## 2016-04-27 DIAGNOSIS — Z923 Personal history of irradiation: Secondary | ICD-10-CM

## 2016-04-27 HISTORY — DX: Presence of cardiac pacemaker: Z95.0

## 2016-04-27 LAB — CBC WITH DIFFERENTIAL/PLATELET
BASOS ABS: 0 10*3/uL (ref 0.0–0.1)
Basophils Relative: 0 %
EOS PCT: 0 %
Eosinophils Absolute: 0 10*3/uL (ref 0.0–0.7)
HEMATOCRIT: 29.5 % — AB (ref 39.0–52.0)
Hemoglobin: 9.4 g/dL — ABNORMAL LOW (ref 13.0–17.0)
LYMPHS ABS: 1 10*3/uL (ref 0.7–4.0)
LYMPHS PCT: 11 %
MCH: 29.6 pg (ref 26.0–34.0)
MCHC: 31.9 g/dL (ref 30.0–36.0)
MCV: 92.8 fL (ref 78.0–100.0)
Monocytes Absolute: 0.6 10*3/uL (ref 0.1–1.0)
Monocytes Relative: 7 %
NEUTROS ABS: 7.4 10*3/uL (ref 1.7–7.7)
Neutrophils Relative %: 82 %
PLATELETS: 289 10*3/uL (ref 150–400)
RBC: 3.18 MIL/uL — AB (ref 4.22–5.81)
RDW: 14.9 % (ref 11.5–15.5)
WBC: 9 10*3/uL (ref 4.0–10.5)

## 2016-04-27 LAB — URINALYSIS, ROUTINE W REFLEX MICROSCOPIC
GLUCOSE, UA: NEGATIVE mg/dL
HGB URINE DIPSTICK: NEGATIVE
KETONES UR: NEGATIVE mg/dL
Leukocytes, UA: NEGATIVE
Nitrite: NEGATIVE
PH: 7 (ref 5.0–8.0)
Protein, ur: 30 mg/dL — AB
SPECIFIC GRAVITY, URINE: 1.018 (ref 1.005–1.030)

## 2016-04-27 LAB — URINE MICROSCOPIC-ADD ON

## 2016-04-27 LAB — TROPONIN I
Troponin I: 0.05 ng/mL (ref <0.03)
Troponin I: 0.04 ng/mL

## 2016-04-27 LAB — CBC
HCT: 28.2 % — ABNORMAL LOW (ref 39.0–52.0)
HEMOGLOBIN: 9 g/dL — AB (ref 13.0–17.0)
MCH: 29.4 pg (ref 26.0–34.0)
MCHC: 31.9 g/dL (ref 30.0–36.0)
MCV: 92.2 fL (ref 78.0–100.0)
PLATELETS: 280 10*3/uL (ref 150–400)
RBC: 3.06 MIL/uL — AB (ref 4.22–5.81)
RDW: 14.9 % (ref 11.5–15.5)
WBC: 8.9 10*3/uL (ref 4.0–10.5)

## 2016-04-27 LAB — COMPREHENSIVE METABOLIC PANEL WITH GFR
ALT: 110 U/L — ABNORMAL HIGH (ref 17–63)
AST: 148 U/L — ABNORMAL HIGH (ref 15–41)
Albumin: 2.3 g/dL — ABNORMAL LOW (ref 3.5–5.0)
Alkaline Phosphatase: 643 U/L — ABNORMAL HIGH (ref 38–126)
Anion gap: 7 (ref 5–15)
BUN: 15 mg/dL (ref 6–20)
CO2: 26 mmol/L (ref 22–32)
Calcium: 9.5 mg/dL (ref 8.9–10.3)
Chloride: 101 mmol/L (ref 101–111)
Creatinine, Ser: 0.86 mg/dL (ref 0.61–1.24)
GFR calc Af Amer: >60 mL/min
GFR calc non Af Amer: >60 mL/min
Glucose, Bld: 113 mg/dL — ABNORMAL HIGH (ref 65–99)
Potassium: 3.7 mmol/L (ref 3.5–5.1)
Sodium: 134 mmol/L — ABNORMAL LOW (ref 135–145)
Total Bilirubin: 1.3 mg/dL — ABNORMAL HIGH (ref 0.3–1.2)
Total Protein: 6.7 g/dL (ref 6.5–8.1)

## 2016-04-27 LAB — LACTIC ACID, PLASMA: Lactic Acid, Venous: 1.3 mmol/L (ref 0.5–1.9)

## 2016-04-27 LAB — SEDIMENTATION RATE: SED RATE: 133 mm/h — AB (ref 0–20)

## 2016-04-27 MED ORDER — ENOXAPARIN SODIUM 40 MG/0.4ML ~~LOC~~ SOLN
40.0000 mg | SUBCUTANEOUS | Status: DC
  Administered 2016-04-27 – 2016-04-28 (×2): 40 mg via SUBCUTANEOUS
  Filled 2016-04-27 (×3): qty 0.4

## 2016-04-27 MED ORDER — IPRATROPIUM BROMIDE HFA 17 MCG/ACT IN AERS: 2.0000 | INHALATION_SPRAY | Freq: Two times a day (BID) | RESPIRATORY_TRACT | Status: DC | PRN

## 2016-04-27 MED ORDER — ENSURE ENLIVE PO LIQD
237.0000 mL | Freq: Two times a day (BID) | ORAL | Status: DC
  Administered 2016-04-27 – 2016-05-02 (×7): 237 mL via ORAL

## 2016-04-27 MED ORDER — CEFEPIME HCL 2 G IJ SOLR
2.0000 g | Freq: Two times a day (BID) | INTRAMUSCULAR | Status: DC
  Filled 2016-04-27 (×2): qty 2

## 2016-04-27 MED ORDER — PIPERACILLIN-TAZOBACTAM 3.375 G IVPB
3.3750 g | Freq: Three times a day (TID) | INTRAVENOUS | Status: DC
  Filled 2016-04-27 (×3): qty 50

## 2016-04-27 MED ORDER — DEXTROSE 5 % IV SOLN
2.0000 g | Freq: Two times a day (BID) | INTRAVENOUS | Status: DC
  Administered 2016-04-28 – 2016-04-29 (×3): 2 g via INTRAVENOUS
  Filled 2016-04-27 (×4): qty 2

## 2016-04-27 MED ORDER — ASPIRIN EC 81 MG PO TBEC
81.0000 mg | DELAYED_RELEASE_TABLET | Freq: Every day | ORAL | Status: DC
  Administered 2016-04-27 – 2016-05-01 (×5): 81 mg via ORAL
  Filled 2016-04-27 (×5): qty 1

## 2016-04-27 MED ORDER — ACETAMINOPHEN 500 MG PO TABS
1000.0000 mg | ORAL_TABLET | Freq: Every day | ORAL | Status: DC
  Administered 2016-04-27 – 2016-05-01 (×5): 1000 mg via ORAL
  Filled 2016-04-27 (×5): qty 2

## 2016-04-27 MED ORDER — SERTRALINE HCL 50 MG PO TABS
150.0000 mg | ORAL_TABLET | Freq: Every day | ORAL | Status: DC
  Administered 2016-04-28 – 2016-05-02 (×5): 150 mg via ORAL
  Filled 2016-04-27 (×5): qty 1

## 2016-04-27 MED ORDER — METOPROLOL TARTRATE 12.5 MG HALF TABLET
12.5000 mg | ORAL_TABLET | Freq: Two times a day (BID) | ORAL | Status: DC
  Administered 2016-04-28 – 2016-05-02 (×9): 12.5 mg via ORAL
  Filled 2016-04-27 (×9): qty 1

## 2016-04-27 MED ORDER — MOMETASONE FURO-FORMOTEROL FUM 100-5 MCG/ACT IN AERO
2.0000 | INHALATION_SPRAY | Freq: Two times a day (BID) | RESPIRATORY_TRACT | Status: DC
  Administered 2016-04-28 – 2016-05-02 (×8): 2 via RESPIRATORY_TRACT
  Filled 2016-04-27 (×2): qty 8.8

## 2016-04-27 MED ORDER — ATORVASTATIN CALCIUM 40 MG PO TABS
40.0000 mg | ORAL_TABLET | Freq: Every day | ORAL | Status: DC
  Administered 2016-04-27: 40 mg via ORAL
  Filled 2016-04-27: qty 1

## 2016-04-27 MED ORDER — CARBAMAZEPINE 100 MG PO CHEW
300.0000 mg | CHEWABLE_TABLET | Freq: Two times a day (BID) | ORAL | Status: DC
  Administered 2016-04-27 – 2016-05-02 (×10): 300 mg via ORAL
  Filled 2016-04-27 (×10): qty 3

## 2016-04-27 MED ORDER — DEXTROSE 5 % IV SOLN
2.0000 g | INTRAVENOUS | Status: DC
  Administered 2016-04-27: 2 g via INTRAVENOUS
  Filled 2016-04-27: qty 2

## 2016-04-27 MED ORDER — SODIUM CHLORIDE 0.9 % IV SOLN
INTRAVENOUS | Status: DC
  Administered 2016-04-27: 14:00:00 via INTRAVENOUS

## 2016-04-27 MED ORDER — CLOPIDOGREL BISULFATE 75 MG PO TABS
75.0000 mg | ORAL_TABLET | Freq: Every day | ORAL | Status: DC
  Administered 2016-04-27 – 2016-04-28 (×2): 75 mg via ORAL
  Filled 2016-04-27 (×2): qty 1

## 2016-04-27 MED ORDER — FAMOTIDINE 20 MG PO TABS
20.0000 mg | ORAL_TABLET | Freq: Every day | ORAL | Status: DC
  Administered 2016-04-27 – 2016-05-02 (×6): 20 mg via ORAL
  Filled 2016-04-27 (×6): qty 1

## 2016-04-27 MED ORDER — IPRATROPIUM BROMIDE 0.02 % IN SOLN: 0.5000 mg | Freq: Two times a day (BID) | RESPIRATORY_TRACT | Status: DC | PRN

## 2016-04-27 MED ORDER — ACETAMINOPHEN 500 MG PO TABS: 500.0000 mg | ORAL_TABLET | Freq: Every day | ORAL | Status: DC

## 2016-04-27 MED ORDER — ALBUTEROL SULFATE (2.5 MG/3ML) 0.083% IN NEBU: 3.0000 mL | INHALATION_SOLUTION | Freq: Four times a day (QID) | RESPIRATORY_TRACT | Status: DC | PRN

## 2016-04-27 MED ORDER — CALCIUM CARBONATE-VITAMIN D 500-200 MG-UNIT PO TABS: 2.0000 | ORAL_TABLET | Freq: Every day | ORAL | Status: DC

## 2016-04-27 MED ORDER — ISOSORBIDE MONONITRATE ER 60 MG PO TB24: 90.0000 mg | ORAL_TABLET | Freq: Every day | ORAL | Status: DC

## 2016-04-27 MED ORDER — TAMSULOSIN HCL 0.4 MG PO CAPS
0.4000 mg | ORAL_CAPSULE | Freq: Every day | ORAL | Status: DC
  Administered 2016-04-27 – 2016-05-01 (×5): 0.4 mg via ORAL
  Filled 2016-04-27 (×5): qty 1

## 2016-04-27 MED ORDER — FINASTERIDE 5 MG PO TABS
5.0000 mg | ORAL_TABLET | Freq: Every day | ORAL | Status: DC
  Administered 2016-04-28 – 2016-05-02 (×5): 5 mg via ORAL
  Filled 2016-04-27 (×5): qty 1

## 2016-04-27 MED ORDER — PREDNISONE 20 MG PO TABS
40.0000 mg | ORAL_TABLET | Freq: Every day | ORAL | Status: DC
  Administered 2016-04-28 – 2016-04-29 (×2): 40 mg via ORAL
  Filled 2016-04-27 (×2): qty 2

## 2016-04-27 NOTE — Progress Notes (Signed)
CRITICAL VALUE ALERT  Critical value received: Troponin 0.05  Date of notification:  04/27/16  Time of notification:  1953  Critical value read back:yes  Nurse who received alert:  S.Famous Eisenhardt, RN  MD notified (1st page):  Dr. Lindell Noe  Time of first page:  2035  MD notified (2nd page):  Time of second page:  Responding MD:  Dr. Lindell Noe  Time MD responded:  2035

## 2016-04-27 NOTE — Telephone Encounter (Signed)
DianeVisual merchandiser) calling with [facility]

## 2016-04-27 NOTE — Telephone Encounter (Signed)
Spoke to [employer]. Per order- contacted patient to inform that Dr Stanford Breed would like patient to be admitted to [facility] today under  Family medicine service.  Patient is aware. Patient aware admission will contact him when a bed is available. Patient verbalized understanding

## 2016-04-27 NOTE — Progress Notes (Signed)
Pharmacy Antibiotic Note  [name] is a 82 y.o. male admitted on 04/27/2016 with bacteremia.  Pharmacy has been consulted for Zosyn dosing. Pt is direct admit from cardiology with 1/2 blood cultures positive for gram negative rods.  Plan: Zosyn 3.375g IV q8h (4 hour infusion).  Monitor culture data, renal function and clinical course     Temp (24hrs), Avg:98.1 F (36.7 C), Min:98.1 F (36.7 C), Max:98.1 F (36.7 C)  No results for input(s): WBC, CREATININE, LATICACIDVEN, VANCOTROUGH, VANCOPEAK, VANCORANDOM, GENTTROUGH, GENTPEAK, GENTRANDOM, TOBRATROUGH, TOBRAPEAK, TOBRARND, AMIKACINPEAK, AMIKACINTROU, AMIKACIN in the last 168 hours.  CrCl cannot be calculated (Patient's most recent lab result is older than the maximum 21 days allowed.).    No Known Allergies  Antimicrobials this admission: Zosyn 8/29 >>    Andrey Cota. Diona Foley, PharmD, BCPS Clinical Pharmacist Pager [phone] 04/27/2016 1:50 PM

## 2016-04-27 NOTE — Telephone Encounter (Signed)
Spoke to [employer] a positive blood culture - one bottle Grew gram negative rods- prelimanary placed in computer. Forward message to Dr Stanford Breed

## 2016-04-27 NOTE — Progress Notes (Addendum)
Pharmacy Antibiotic Note  [name] is a 82 y.o. male admitted on 04/27/2016 with GNR bacteremia and R/O endocardititis.  Pharmacy has been consulted for Cefepime dosing.  82yo male with (+)GNR in outpt blood cx and new heart murmur.  Zosyn initially ordered but dose not given yet as awaiting repeat cx to be drawn here.   Cr in May 2017 was 0.84, estimating CrCl ~40-45  Plan: Cefepime 2g IV q24 F/U Cr and readjust dosing as needed  Weight: 150 lb 14.4 oz (68.4 kg)  Temp (24hrs), Avg:98.1 F (36.7 C), Min:98.1 F (36.7 C), Max:98.1 F (36.7 C)  No results for input(s): WBC, CREATININE, LATICACIDVEN, VANCOTROUGH, VANCOPEAK, VANCORANDOM, GENTTROUGH, GENTPEAK, GENTRANDOM, TOBRATROUGH, TOBRAPEAK, TOBRARND, AMIKACINPEAK, AMIKACINTROU, AMIKACIN in the last 168 hours.  CrCl cannot be calculated (Patient's most recent lab result is older than the maximum 21 days allowed.).    No Known Allergies    Thank you for allowing pharmacy to be a part of this patient's care.   Gracy Bruins, PharmD Clinical Pharmacist [facility]    Addendum:  Scr returned at 0.98.  Estimated CrCl 39ml/min.  Will adjust dose of Cefepime to 2gm IV q12h.  [employer], Pharm.D., BCPS Clinical Pharmacist Pager [phone] 04/27/2016 9:01 PM

## 2016-04-27 NOTE — H&P (Signed)
[facility] Admission History and Physical Service Pager: [phone]  Patient name: [name] Medical record number: [id number] Date of birth: 01-16-1925 Age: 82 y.o. Gender: male  Primary Care Provider: Bufford Lope, DO Consultants: Cardiology, ID Code Status: FULL  Chief Complaint: bacteremia; fatigue; new DOE  Assessment and Plan: [name] is a 82 y.o. male found to have bacteriemia with fatigue, DOE, new murmur. PMH is significant for HTN, CAD s/p CABG 1986 with redo 1993, PTCA 2008, and DES 2010, hx MI, LBBB, HFrEF (but recent ECHO 08/2014 with normal EF 55-60%, G1DD), Asthma/COPD, GERD, OA, Osteoporosis, Dyslipidemia, Peripheral Neuropathy, BPH with history or urinary retention, Pacemaker for tachy brady syndrome (03/2015), History of atrial flutter (no anticoagulation due to falls and anemia)   Bacteremia: 2/2 blood cultures positive for gram negative rods resulted in less than 24hrs. The cardiology office called and asked for direct admission by our service. They will follow along with the patient and consult ID. The patient does have a pacemaker which puts him at a higher risk for endocarditis. Vitals stable.   - admit to telemetry, attending Dr. Ree Kida.  - Cardiology service stated they would work on getting labwork from cardiology office.  - Repeat blood cultures here as we cannot monitor the ones from the cardiology office  - Zosyn per pharm  - CBC, CMP, lactic acid  - U/A and urine culture given organism was a gram negative rod  - CXR - EP to do TEE 8/30 - f/u cardiology and ID recs  Intermittent Chest Pain with H/o CAD s/p stent placement: Reports he has similar symptoms at baseline but has worsened during the past few week. No chest pain currently - EKG - troponin x 1  - ASA and Plavix - Lipitor  Chronic Steroid Use: Started on daily prednisone on August 7th for concern for temporal arteritis. Biopsy was negative. Started taper 8/29  as outpatient (from Prednisone 31m to 454mdaily). Will continue slow taper.  - continue Prednisone 4076maily (consider weekly taper by 61m49m  Hypertension: normotensive  - Imdur and metoprolol   Hx of Atrial flutter: not on anticoagulation due to falls and anemia - metoprolol    Systolic CHF: Clinically mildly dry. On Lasix 40mg33mly and Kdur at home - CMP - holding Lasix at least for today - daily weights  Depression: - continue Zoloft   HLD: - continue Lipitor  Asthma/COPD: -continue Symbicort and albuterol PRN  Peripheral Neuropathy:  - continue Tegretol   BPH with history of urinary retention:   - cont finasteride and Flomax   Osteoporosis:  - Calcium-Vit D   FEN/GI: heart healthy; gentle fluids NS @50cc /hr x 12 hours Prophylaxis: Lovenox   Disposition: admit for management/evaluation of bacteremia   History of Present Illness:  [name] 89 y.18 male presenting with 2/2 blood cultures positive for gram negative rods.  The patient was seen in his PCP's office on 03/22/16 with a sore throat, left jaw pain, and concerns for temporal arteritis given elevated ESR and left sided facial pain. Rapid strep test was positive and he was treated with amoxicillin. He was seen again on 8/7 noted that he initially felt better after his course of amoxicillin, however he then began to have fevers again. He noted recurrence of his sore throat and the same left sided jaw pain that radiated up through his left ear. At that time, he noted SOB that was stable over  months and denied chest pain or symptoms of peritonsillar abscess. He continued to have mild headache in the left temporal region with tenderness to palpation over that area but no reported change in vision. Given elevated ESR from previous visit, he was started on prednisone and sent to opthalmology and vascular surgery for temporal artery biopsy that was negative.   Unfortunately, the patient continued to worsen.  He continued to have fevers up to 101.40F in the last 2 weeks, sweats, and chills. His last fever was yesterday he thinks at 101F. He also noted more fatigue and joint pain which he attributed to the prednisone. He also reports of decreased appetite but has been staying hydrated. Denies any burning with urination, no hematuria, or increased frequency. Reports of urine "discoloration to dark orange" over the past 3-4 weeks which he attributes to starting Melatonin. He also reports RLQ abdominal discomfort that is achy and intermittent for the past 3-4 weeks; symptoms come on randomly and there is no pattern to this. Has been having "large BMs" since starting Prednisone but denies diarrhea or blood in stool. Had two hernia repairs in the past as well as appendectomy. Reports of new DOE in the past 3-4 weeks; denies orthopnea or increase swelling in lower extremities. No cough or wheezing. He also reports of intermittent chest pain with exertion; reports he usually does have these symptoms but they seemed to have worsened the past 3-4 weeks. Reports he has left ear pain that is not present now, but notes of left ear fullness. He also had left neck pain this AM which is not currently present. Of note, he reports of having dental work done about 5 weeks ago (refilling); denies having complications from this.   The patient saw his cardiologist the day prior to admission.  Blood cultures were obtained to rule out bacteremia given concerns for endocarditis with a new murmur noted on exam and 1/2 came back positive for gram negative rods on the day of admission. Cardiology called patient to be directly admitted to the family medicine service.     Review Of Systems: Per HPI Otherwise the remainder of the systems were negative.  Patient Active Problem List   Diagnosis Date Noted  . Bacteremia 04/27/2016  . Headache 04/05/2016  . Sore throat 03/22/2016  . Insomnia 02/18/2016  . Pacemaker 12/25/2015  . Atrial  flutter, RVR 04/10/2015  . Unstable angina ([place]) 04/10/2015  . CAD (coronary artery disease), previous stents to mLAD,  04/10/2015  . Urinary retention due to benign prostatic hyperplasia   . Systolic CHF ([place]) 90/24/0973  . Depression 09/26/2013  . Intertrochanteric fracture of right hip ([place]) 08/29/2013  . Hypertension   . GERD (gastroesophageal reflux disease)   . COPD (chronic obstructive pulmonary disease) ([place])   . H/O cardiac arrest   . Junctional bradycardia 07/25/2012  . Glucose intolerance (impaired glucose tolerance) 07/19/2012  . Normocytic anemia 07/19/2012  . Acute MI, anterolateral wall ([place]) 07/16/2012  . Prostatitis 07/16/2012  . E. coli UTI (urinary tract infection) 07/16/2012  . Elevated transaminase level 07/16/2012  . Foot drop, right 07/28/2011  . LBBB (left bundle branch block) 03/29/2011  . Hx of CABG and re-do CABG  03/29/2011  . Ischemic heart disease   . Myocardial infarction ([place])   . Acute systolic congestive heart failure, NYHA class 2-EF 40-45%   . Chronic respiratory failure ([place])   . Chronic asthma   . Peripheral neuropathy ([place])   . Dyslipidemia (high LDL; low HDL)   .  Essential hypertension 04/04/2007  . PSORIASIS 04/04/2007  . OSTEOARTHRITIS 04/04/2007  . OSTEOPOROSIS 04/04/2007  . Cor Athrscl-Uns Vessel 01/05/2007  . HERNIA 01/05/2007    Past Medical History: Past Medical History:  Diagnosis Date  . Anemia 2014   was on iron pills and then was able to come off  . Arthritis    "all over"  . Asthma       . Atrial flutter ([place])    a. diagnosed 03/2015  . Complication of anesthesia    B/P dropped low and had to stay in recovery longer  . Coronary artery disease    a. s/p CABG 1986 b. redo CABG 1993 c. PTCA 2008 d. DES 2010  . Depression       . Diverticulosis   . Dyslipidemia (high LDL; low HDL)       . GERD (gastroesophageal reflux disease)       . Hepatitis    hepatitis- long time ago, occupational contamination   . Hiatal  hernia   . Hyperlipemia   . Hypertension       . Insomnia       . Kidney stones   . On home oxygen therapy    "2L; just at night" (01/31/2015)  . Peripheral neuropathy ([place])   . Peripheral neuropathy ([place])       . Peripheral vascular disease (HCC)    thrombus- in leg- many yrs. ago- ?R- coumadin x1 mth  . Pneumonia 1960's  . Prostate cancer [facility])    radiation therapy- 2002  . Prostatitis   . Systolic CHF ([place])       . VT (ventricular tachycardia) ([place]) 06/2012   a. in the setting of STEMI b. on amio during hospitalization    Past Surgical History: Past Surgical History:  Procedure Laterality Date  . APPENDECTOMY  1943  . CATARACT EXTRACTION W/ INTRAOCULAR LENS  IMPLANT, BILATERAL    . COLONOSCOPY    . CORONARY ARTERY BYPASS GRAFT  1986;  1993   CABG X 5; CABG X 1  . CYSTOSCOPY W/ URETERAL STENT PLACEMENT Right 02/28/2013   Procedure: CYSTOSCOPY WITH RETROGRADE PYELOGRAM/URETERAL STENT PLACEMENT;  Surgeon: Dutch Gray, MD;  Location: [facility];  Service: Urology;  Laterality: Right;  . CYSTOSCOPY WITH URETEROSCOPY AND STENT PLACEMENT Right 03/23/2013   Procedure: RIGHT URETEROSCOPY, LASER LITHO AND STENT PLACEMENT;  Surgeon: Fredricka Bonine, MD;  Location: [facility];  Service: Urology;  Laterality: Right;  . EP IMPLANTABLE DEVICE N/A 04/11/2015   Procedure: Pacemaker Implant;  Surgeon: Will Meredith Leeds, MD;  Location: [place] CV LAB;  Service: Cardiovascular;  Laterality: N/A;  . FOOT FUSION Left 2000   heel  . FRACTURE SURGERY     heel- crushed -2002,  (hardware)   . HAMMER TOE SURGERY Right    little toe  . HARDWARE REMOVAL Right 2002   great toe; Dr. Sharol Given  . HOLMIUM LASER APPLICATION Right 4/62/7035   Procedure: HOLMIUM LASER APPLICATION;  Surgeon: Fredricka Bonine, MD;  Location: [facility];  Service: Urology;  Laterality: Right;  . INGUINAL HERNIA REPAIR Left 01/31/2015   Procedure: OPEN REPAIR RECURRENT LEFT INGUINAL HERNIA;  Surgeon: Fanny Skates, MD;   Location: [place];  Service: General;  Laterality: Left;  . INSERTION OF MESH Left 01/31/2015   Procedure: INSERTION OF MESH;  Surgeon: Fanny Skates, MD;  Location: [place];  Service: General;  Laterality: Left;  . INTRAMEDULLARY (IM) NAIL INTERTROCHANTERIC Right 08/29/2013   Procedure: INTRAMEDULLARY (IM) NAIL INTERTROCHANTRIC;  Surgeon: Marianna Payment, MD;  Location: [place];  Service: Orthopedics;  Laterality: Right;  . JOINT REPLACEMENT     R great toe  . KNEE ARTHROSCOPY Right X 2  . LEFT AND RIGHT HEART CATHETERIZATION WITH CORONARY ANGIOGRAM N/A 07/15/2012   Procedure: LEFT AND RIGHT HEART CATHETERIZATION WITH CORONARY ANGIOGRAM;  Surgeon: Jettie Booze, MD;  Location: [facility] CATH LAB;  Service: Cardiovascular;  Laterality: N/A;  . LUMBAR LAMINECTOMY/DECOMPRESSION MICRODISCECTOMY  08/10/2011   Procedure: LUMBAR LAMINECTOMY/DECOMPRESSION MICRODISCECTOMY;  Surgeon: Cooper Render Pool;  Location: [place] [facility];  Service: Neurosurgery;  Laterality: Right;  RIGHT Lumbar five-sacral one LAMINECTOMY, MICRODISCECTOMY  . LUMBAR WOUND DEBRIDEMENT  08/22/2011   Procedure: LUMBAR WOUND DEBRIDEMENT;  Surgeon: Eustace Moore;  Location: [place] [facility];  Service: Neurosurgery;  Laterality: N/A;  Repair of CSF Leak requiring laminectomy  . PERCUTANEOUS CORONARY STENT INTERVENTION (PCI-S) N/A 07/15/2012   Procedure: PERCUTANEOUS CORONARY STENT INTERVENTION (PCI-S);  Surgeon: Jettie Booze, MD;  Location: [facility] CATH LAB;  Service: Cardiovascular;  Laterality: N/A;  . RHINOPLASTY  [id number]  . TONSILLECTOMY  1940  . TRANSURETHRAL RESECTION OF PROSTATE  x2    Social History: Social History  Substance Use Topics  . Smoking status: Never Smoker  . Smokeless tobacco: Never Used  . Alcohol use No   Additional social history: no illicit drug use Please also refer to relevant sections of EMR.  Family History: Family History  Problem Relation Age of Onset  . Heart attack Mother   . Addison's disease Father    . Heart attack Sister   . Anesthesia problems Neg Hx   . Hypotension Neg Hx   . Malignant hyperthermia Neg Hx   . Pseudochol deficiency Neg Hx     Allergies and Medications: No Known Allergies No current facility-administered medications on file prior to encounter.    Current Outpatient Prescriptions on File Prior to Encounter  Medication Sig Dispense Refill  . acetaminophen (TYLENOL) 500 MG tablet Take 500 mg by mouth at bedtime. Take at bedtime every night per patient    . albuterol (PROVENTIL HFA;VENTOLIN HFA) 108 (90 Base) MCG/ACT inhaler Inhale 2 puffs into the lungs every 6 (six) hours as needed for wheezing or shortness of breath. 1 Inhaler 0  . aspirin EC 81 MG EC tablet Take 1 tablet (81 mg total) by mouth daily.    [name] atorvastatin (LIPITOR) 40 MG tablet Take 40 mg by mouth daily at 6 PM.    . budesonide-formoterol (SYMBICORT) 80-4.5 MCG/ACT inhaler Inhale 2 puffs into the lungs 2 (two) times daily.    . calcium-vitamin D (OSCAL WITH D) 500-200 MG-UNIT per tablet Take 2 tablets by mouth daily.    . carbamazepine (TEGRETOL) 100 MG chewable tablet Chew 300 mg by mouth 2 (two) times daily.    . clopidogrel (PLAVIX) 75 MG tablet Take 1 tablet (75 mg total) by mouth daily.    [name] docusate sodium (COLACE) 100 MG capsule Take 100 mg by mouth daily.    . finasteride (PROSCAR) 5 MG tablet Take 5 mg by mouth daily.     . furosemide (LASIX) 40 MG tablet Take 40 mg by mouth every morning.    [name] ipratropium (ATROVENT HFA) 17 MCG/ACT inhaler Inhale 2 puffs into the lungs 2 (two) times daily as needed for wheezing.     . isosorbide mononitrate (IMDUR) 60 MG 24 hr tablet Take 90 mg by mouth daily.     . metoprolol succinate (TOPROL-XL) 12.5 mg TB24  24 hr tablet Take 12.5 mg by mouth daily.    . Multiple Vitamins-Minerals (MULTIVITAMIN WITH MINERALS) tablet Take 1 tablet by mouth every evening.     [name] NITROSTAT 0.4 MG SL tablet PLACE 1 TABLET UNDER THE TONGUE EVERY 5 MINUTES ASDIRECTED BY PHYSICIAN  25 tablet 3  . potassium chloride SA (K-DUR,KLOR-CON) 20 MEQ tablet Take 20 mEq by mouth daily.     . predniSONE (DELTASONE) 20 MG tablet Take 3 tablets (60 mg total) by mouth daily with breakfast. 90 tablet 0  . predniSONE (STERAPRED UNI-PAK 21 TAB) 10 MG (21) TBPK tablet Take as directed. 21 tablet 0  . ranitidine (ZANTAC) 150 MG tablet TAKE 1 TABLET BY MOUTH 2 TIMES DAILY. 180 tablet 3  . senna (SENOKOT) 8.6 MG tablet Take 1 tablet by mouth daily.     . sertraline (ZOLOFT) 100 MG tablet Take 150 mg by mouth See admin instructions. 1 AND 1/2 TABLET BY MOUTH DAILY    . tamsulosin (FLOMAX) 0.4 MG CAPS capsule Take 1 capsule (0.4 mg total) by mouth daily. 30 capsule 0    Objective: BP 128/65 (BP Location: Right Arm)   Pulse 66   Temp 98.1 F (36.7 C) (Oral)   Resp 16   SpO2 97%  Exam: General: NAD, elderly gentleman, pleasant Eyes: PERRL, EOMI ENTM: dry mucous membranes, no tenderness to palpation of gums.  Neck: normal ROM  Cardiovascular: RRR. Systolic murmur 3/6. No rubs or gallops; normal capillary refill. Normal DP pulses Respiratory: normal wob, on room air with normal saturations, CTAB Abdomen: soft, NT, ND, +BS MSK: LE- no swelling noted; able to move all extremities spontaneously  Skin: bruising noted on upper extremities; no skin breakdown noted  Neuro: grossly intact, no focal neurological deficits Psych: appropriate affect with normal mood  Labs and Imaging: CBC BMET  No results for input(s): WBC, HGB, HCT, PLT in the last 168 hours. No results for input(s): NA, K, CL, CO2, BUN, CREATININE, GLUCOSE, CALCIUM in the last 168 hours.     Smiley Houseman, MD 04/27/2016, 1:47 PM PGY-2, [place] [facility] pager: [phone], text pages welcome

## 2016-04-27 NOTE — Progress Notes (Signed)
This RN has notified phlebotomy staff twice regarding need to have blood cultures done on patient as soon as possible. Per phlebotomist, she is working on STAT labs now and will get to patient afterwards.

## 2016-04-27 NOTE — Consult Note (Signed)
ELECTROPHYSIOLOGY CONSULT NOTE    Patient ID: CRIXUS HOUT MRN: [id number], DOB/AGE: [birth year] 82 y.o.  Admit date: 04/27/2016 Date of Consult: 04/27/2016  Primary Physician: Bufford Lope, DO Primary Cardiologist: Stanford Breed Electrophysiologist: Curt Bears Requesting MD: Weld  Reason for Consultation: gram negative bacteremia with implanted device  HPI:  [name] is a 82 y.o. male with a past medical history significant for CAD s/p CABG, hyperlipidemia, hypertension, sick sinus syndrome s/p PPM.  He has had fevers for the past month with intermittent chills and has been found to be strep A positive.  He then developed headaches and underwent temporal artery biopsy with results pending.  Blood cultures were drawn yesterday and have returned positive for gram negative bacteremia.    He currently denies chest pain, shortness of breath, palpitations, dizziness, syncope, or pre-syncope.   Past Medical History:  Diagnosis Date  . Anemia 2014   was on iron pills and then was able to come off  . Arthritis    "all over"  . Asthma       . Atrial flutter ([place])    a. diagnosed 03/2015  . Complication of anesthesia    B/P dropped low and had to stay in recovery longer  . Coronary artery disease    a. s/p CABG 1986 b. redo CABG 1993 c. PTCA 2008 d. DES 2010  . Depression       . Diverticulosis   . GERD (gastroesophageal reflux disease)       . Hepatitis    hepatitis- long time ago, occupational contamination   . Hiatal hernia   . Hyperlipemia   . Hypertension       . Insomnia       . Kidney stones   . On home oxygen therapy    "2L; just at night" (01/31/2015)  . Peripheral neuropathy ([place])   . Peripheral vascular disease (HCC)    thrombus- in leg- many yrs. ago- ?R- coumadin x1 mth  . Pneumonia 1960's  . Prostate cancer [name])    radiation therapy- 2002  . Systolic CHF ([place])       . VT (ventricular tachycardia) ([place]) 06/2012   a. in the setting of STEMI b. on amio during  hospitalization     Surgical History:  Past Surgical History:  Procedure Laterality Date  . APPENDECTOMY  1943  . CATARACT EXTRACTION W/ INTRAOCULAR LENS  IMPLANT, BILATERAL    . COLONOSCOPY    . CORONARY ARTERY BYPASS GRAFT  1986;  1993   CABG X 5; CABG X 1  . CYSTOSCOPY W/ URETERAL STENT PLACEMENT Right 02/28/2013   Procedure: CYSTOSCOPY WITH RETROGRADE PYELOGRAM/URETERAL STENT PLACEMENT;  Surgeon: Dutch Gray, MD;  Location: [facility];  Service: Urology;  Laterality: Right;  . CYSTOSCOPY WITH URETEROSCOPY AND STENT PLACEMENT Right 03/23/2013   Procedure: RIGHT URETEROSCOPY, LASER LITHO AND STENT PLACEMENT;  Surgeon: Fredricka Bonine, MD;  Location: [facility];  Service: Urology;  Laterality: Right;  . EP IMPLANTABLE DEVICE N/A 04/11/2015   Procedure: Pacemaker Implant;  Surgeon: Naphtali Zywicki Meredith Leeds, MD;  Location: [place] CV LAB;  Service: Cardiovascular;  Laterality: N/A;  . FOOT FUSION Left 2000   heel  . FRACTURE SURGERY     heel- crushed -2002,  (hardware)   . HAMMER TOE SURGERY Right    little toe  . HARDWARE REMOVAL Right 2002   great toe; Dr. Sharol Given  . HOLMIUM LASER APPLICATION Right [id number]   Procedure: HOLMIUM LASER APPLICATION;  Surgeon:  Fredricka Bonine, MD;  Location: [facility];  Service: Urology;  Laterality: Right;  . INGUINAL HERNIA REPAIR Left 01/31/2015   Procedure: OPEN REPAIR RECURRENT LEFT INGUINAL HERNIA;  Surgeon: Fanny Skates, MD;  Location: [place];  Service: General;  Laterality: Left;  . INSERTION OF MESH Left 01/31/2015   Procedure: INSERTION OF MESH;  Surgeon: Fanny Skates, MD;  Location: [place];  Service: General;  Laterality: Left;  . INTRAMEDULLARY (IM) NAIL INTERTROCHANTERIC Right 08/29/2013   Procedure: INTRAMEDULLARY (IM) NAIL INTERTROCHANTRIC;  Surgeon: Marianna Payment, MD;  Location: [place];  Service: Orthopedics;  Laterality: Right;  . JOINT REPLACEMENT     R great toe  . KNEE ARTHROSCOPY Right X 2  . LEFT AND RIGHT HEART CATHETERIZATION  WITH CORONARY ANGIOGRAM N/A 07/15/2012   Procedure: LEFT AND RIGHT HEART CATHETERIZATION WITH CORONARY ANGIOGRAM;  Surgeon: Jettie Booze, MD;  Location: [facility] CATH LAB;  Service: Cardiovascular;  Laterality: N/A;  . LUMBAR LAMINECTOMY/DECOMPRESSION MICRODISCECTOMY  08/10/2011   Procedure: LUMBAR LAMINECTOMY/DECOMPRESSION MICRODISCECTOMY;  Surgeon: Cooper Render Pool;  Location: [place] NEURO ORS;  Service: Neurosurgery;  Laterality: Right;  RIGHT Lumbar five-sacral one LAMINECTOMY, MICRODISCECTOMY  . LUMBAR WOUND DEBRIDEMENT  08/22/2011   Procedure: LUMBAR WOUND DEBRIDEMENT;  Surgeon: Eustace Moore;  Location: [place] NEURO ORS;  Service: Neurosurgery;  Laterality: N/A;  Repair of CSF Leak requiring laminectomy  . PERCUTANEOUS CORONARY STENT INTERVENTION (PCI-S) N/A 07/15/2012   Procedure: PERCUTANEOUS CORONARY STENT INTERVENTION (PCI-S);  Surgeon: Jettie Booze, MD;  Location: [facility] CATH LAB;  Service: Cardiovascular;  Laterality: N/A;  . RHINOPLASTY  [id number]  . TONSILLECTOMY  1940  . TRANSURETHRAL RESECTION OF PROSTATE  x2     Prescriptions Prior to Admission  Medication Sig Dispense Refill Last Dose  . acetaminophen (TYLENOL) 500 MG tablet Take 500 mg by mouth at bedtime. Take at bedtime every night per patient   Taking  . albuterol (PROVENTIL HFA;VENTOLIN HFA) 108 (90 Base) MCG/ACT inhaler Inhale 2 puffs into the lungs every 6 (six) hours as needed for wheezing or shortness of breath. 1 Inhaler 0 Taking  . aspirin EC 81 MG EC tablet Take 1 tablet (81 mg total) by mouth daily.   Taking  . atorvastatin (LIPITOR) 40 MG tablet Take 40 mg by mouth daily at 6 PM.   Taking  . budesonide-formoterol (SYMBICORT) 80-4.5 MCG/ACT inhaler Inhale 2 puffs into the lungs 2 (two) times daily.   Taking  . calcium-vitamin D (OSCAL WITH D) 500-200 MG-UNIT per tablet Take 2 tablets by mouth daily.   Taking  . carbamazepine (TEGRETOL) 100 MG chewable tablet Chew 300 mg by mouth 2 (two) times daily.   Taking  .  clopidogrel (PLAVIX) 75 MG tablet Take 1 tablet (75 mg total) by mouth daily.   Taking  . docusate sodium (COLACE) 100 MG capsule Take 100 mg by mouth daily.   Taking  . finasteride (PROSCAR) 5 MG tablet Take 5 mg by mouth daily.    Taking  . furosemide (LASIX) 40 MG tablet Take 40 mg by mouth every morning.   Taking  . ipratropium (ATROVENT HFA) 17 MCG/ACT inhaler Inhale 2 puffs into the lungs 2 (two) times daily as needed for wheezing.    Taking  . isosorbide mononitrate (IMDUR) 60 MG 24 hr tablet Take 90 mg by mouth daily.    Taking  . metoprolol succinate (TOPROL-XL) 12.5 mg TB24 24 hr tablet Take 12.5 mg by mouth daily.   Taking  . Multiple Vitamins-Minerals (MULTIVITAMIN  WITH MINERALS) tablet Take 1 tablet by mouth every evening.    Taking  . NITROSTAT 0.4 MG SL tablet PLACE 1 TABLET UNDER THE TONGUE EVERY 5 MINUTES ASDIRECTED BY PHYSICIAN 25 tablet 3 Taking  . potassium chloride SA (K-DUR,KLOR-CON) 20 MEQ tablet Take 20 mEq by mouth daily.    Taking  . predniSONE (DELTASONE) 20 MG tablet Take 3 tablets (60 mg total) by mouth daily with breakfast. 90 tablet 0 Taking  . predniSONE (STERAPRED UNI-PAK 21 TAB) 10 MG (21) TBPK tablet Take as directed. 21 tablet 0 Taking  . ranitidine (ZANTAC) 150 MG tablet TAKE 1 TABLET BY MOUTH 2 TIMES DAILY. 180 tablet 3 Taking  . senna (SENOKOT) 8.6 MG tablet Take 1 tablet by mouth daily.    Taking  . sertraline (ZOLOFT) 100 MG tablet Take 150 mg by mouth See admin instructions. 1 AND 1/2 TABLET BY MOUTH DAILY   Taking  . tamsulosin (FLOMAX) 0.4 MG CAPS capsule Take 1 capsule (0.4 mg total) by mouth daily. 30 capsule 0 Taking    Inpatient Medications:  . acetaminophen  500 mg Oral QHS  . aspirin  81 mg Oral q1800  . atorvastatin  40 mg Oral q1800  . calcium-vitamin D  2 tablet Oral Daily  . carbamazepine  300 mg Oral BID  . clopidogrel  75 mg Oral q1800  . enoxaparin (LOVENOX) injection  40 mg Subcutaneous Q24H  . famotidine  20 mg Oral Daily  . [START  ON 04/28/2016] finasteride  5 mg Oral Daily  . [START ON 04/28/2016] isosorbide mononitrate  90 mg Oral Daily  . [START ON 04/28/2016] metoprolol tartrate  12.5 mg Oral BID  . mometasone-formoterol  2 puff Inhalation BID  . piperacillin-tazobactam (ZOSYN)  IV  3.375 g Intravenous Q8H  . tamsulosin  0.4 mg Oral QPC supper    Allergies: No Known Allergies  Social History   Social History  . Marital status: Married    Spouse name: N/A  . Number of children: N/A  . Years of education: N/A   Occupational History  . Not on file.   Social History Main Topics  . Smoking status: Never Smoker  . Smokeless tobacco: Never Used  . Alcohol use No  . Drug use: No  . Sexual activity: Not on file   Other Topics Concern  . Not on file   Social History Narrative  . No narrative on file     Family History  Problem Relation Age of Onset  . Heart attack Mother   . Addison's disease Father   . Heart attack Sister   . Anesthesia problems Neg Hx   . Hypotension Neg Hx   . Malignant hyperthermia Neg Hx   . Pseudochol deficiency Neg Hx      Review of Systems: All other systems reviewed and are otherwise negative except as noted above.  Physical Exam: Vitals:   04/27/16 1200  BP: 128/65  Pulse: 66  Resp: 16  Temp: 98.1 F (36.7 C)  TempSrc: Oral  SpO2: 97%    GEN- The patient is elderly and chronically ill appearing, alert and oriented x 3 today.   HEENT: normocephalic, atraumatic; sclera clear, conjunctiva pink; hearing intact; oropharynx clear; neck supple  Lungs- Clear to ausculation bilaterally, normal work of breathing.  No wheezes, rales, rhonchi Heart- Regular rate and rhythm, 2/6 SEM GI- soft, non-tender, non-distended, bowel sounds present  Extremities- no clubbing, cyanosis, or edema  MS- no significant deformity or atrophy Skin- warm  and dry, no rash or lesion, pacemaker site without redness/edema Psych- euthymic mood, full affect Neuro- strength and sensation are  intact  Labs:   Lab Results  Component Value Date   WBC 6.1 03/22/2016   HGB 11.9 (L) 03/22/2016   HCT 35.5 (L) 03/22/2016   MCV 91.3 03/22/2016   PLT 340 03/22/2016   No results for input(s): NA, K, CL, CO2, BUN, CREATININE, CALCIUM, PROT, BILITOT, ALKPHOS, ALT, AST, GLUCOSE in the last 168 hours.  Invalid input(s): LABALBU    Radiology/Studies: No results found.  [id number]  TELEMETRY: sinus rhythm  DEVICE HISTORY: MDT dual chamber PPM implanted 03/2015 for sinus node dysfunction   Assessment/Plan: 1.  Gram negative bacteremia  ID input appreciated Plan for TEE tomorrow - risks, benefits discussed with patient who agrees to proceed If no vegetation on pacemaker leads, Orlie Cundari not plan extraction  2.  SSS Truett Mcfarlan have device formally interrogated this admission  3.  CAD s/p CABG No recent ischemic symptoms Continue medical therapy   4.  HTN Stable No change required today  5.  Atrial flutter Kirubel Aja evaluate burden by device interrogation today Not felt to be a candidate for [facility] with falls and prior syncope   Signed, Chanetta Marshall, NP 04/27/2016 1:54 PM      I have seen and examined this patient with Chanetta Marshall.  Agree with above, note added to reflect my findings.  On exam, regular rhythm, no murmurs, lungs clear.  Has been feeling poorly for the last month and found to have gram - bacteremia.  Plan for TEE tomorrow to rule out vegetation.  If he has no vegetation, may not require device extraction.  Osborn Pullin M. Hobson Lax MD 04/27/2016 3:52 PM

## 2016-04-27 NOTE — Telephone Encounter (Signed)
2nd set of cultures from [facility] resulted positive for gram neg bacteria They will call if further updates, aware we have received results from initial encounter.  Pt currently awaiting bed for direct admission.

## 2016-04-27 NOTE — Consult Note (Signed)
[place] for Infectious Disease    Date of Admission:  04/27/2016           Day 1 piperacillin tazobactam       Reason for Consult: Gram-negative rod bacteremia    Referring Physician: Dr. Dennie Fetters  Principal Problem:   Bacteremia due to Gram-negative bacteria [facility]) Active Problems:   Pacemaker   . acetaminophen  500 mg Oral QHS  . aspirin  81 mg Oral q1800  . atorvastatin  40 mg Oral q1800  . calcium-vitamin D  2 tablet Oral Daily  . carbamazepine  300 mg Oral BID  . clopidogrel  75 mg Oral q1800  . enoxaparin (LOVENOX) injection  40 mg Subcutaneous Q24H  . famotidine  20 mg Oral Daily  . feeding supplement (ENSURE ENLIVE)  237 mL Oral BID BM  . [START ON 04/28/2016] finasteride  5 mg Oral Daily  . [START ON 04/28/2016] isosorbide mononitrate  90 mg Oral Daily  . [START ON 04/28/2016] metoprolol tartrate  12.5 mg Oral BID  . mometasone-formoterol  2 puff Inhalation BID  . piperacillin-tazobactam (ZOSYN)  IV  3.375 g Intravenous Q8H  . [START ON 04/28/2016] predniSONE  40 mg Oral Q breakfast  . [START ON 04/28/2016] sertraline  150 mg Oral Daily  . tamsulosin  0.4 mg Oral QPC supper    Recommendations: 1. Repeat blood cultures 2. Change piperacillin tazobactam to cefepime pending identification of the gram-negative rods and susceptibilities  3. Agree with echocardiogram 4. Recommend stopping prednisone  Assessment: Mr. Gress has a subacute febrile illness with gram-negative rod bacteremia. The source of his bacteremia remains unclear. Although gram-negative rods are relatively unlikely to cause endocarditis or pacemaker infection I certainly agree with echocardiogram. He has no evidence of pneumonia by exam or chest x-ray. His symptoms do not suggest a urinary tract infection. If no obvious sources forthcoming I would consider a CT scan of his abdomen and pelvis looking for occult abscess. Since his temporal artery biopsy was negative and we now  have an alternate diagnosis I would stop prednisone.   HPI: [name] is a 82 y.o. male who has not felt well for about 5 weeks. He began having some sore throat and was diagnosed with strep throat and treated with amoxicillin. He also began to have fairly extreme malaise and intermittent left-sided headaches around that time. A little over a week ago he began to develop fever and chills. His sedimentation rate and C-reactive protein were elevated. Because of the headache there was concern for temporal arteritis. He was started on prednisone 60 mg daily. He underwent a temporal artery biopsy which he says was negative. His prednisone has been tapered to 40 mg daily he has not noted any improvement now he is feeling on prednisone. He saw his cardiologist, Dr. Stanford Breed, yesterday who noted the fever. Blood cultures were obtained in both sets are now growing gram-negative rods.   Review of Systems: Review of Systems  Constitutional: Positive for chills, fever, malaise/fatigue and weight loss. Negative for diaphoresis.  HENT: Positive for sore throat.   Eyes: Negative for blurred vision, double vision and photophobia.  Respiratory: Positive for cough and shortness of breath. Negative for sputum production.        He has had some exertional chest pain and dyspnea on exertion that is more noticeable recently. He frequently notes coughing spells when swallowing larger pills.  Cardiovascular: Positive for chest pain.  Gastrointestinal: Positive for heartburn and nausea. Negative for abdominal pain, constipation, diarrhea and vomiting.       He has dysphagia to dry, solid food such as cornbread and pills.  Genitourinary: Negative for dysuria.       Recent urinary incontinence.  Musculoskeletal: Positive for joint pain.       He has had prior right hip and bilateral foot surgery. He has noted some increased pain in both legs recently. He has right foot drop which is chronic.  Skin: Negative for rash.   Neurological: Positive for headaches. Negative for dizziness, sensory change and speech change.    Past Medical History:  Diagnosis Date  . Anemia 2014   was on iron pills and then was able to come off  . Arthritis    "all over"  . Asthma       . Atrial flutter ([place])    a. diagnosed 03/2015  . Bacteremia 03/2016  . Complication of anesthesia    B/P dropped low and had to stay in recovery longer  . Coronary artery disease    a. s/p CABG 1986 b. redo CABG 1993 c. PTCA 2008 d. DES 2010  . Depression       . Diverticulosis   . GERD (gastroesophageal reflux disease)       . Hepatitis    hepatitis- long time ago, occupational contamination   . Hiatal hernia   . Hyperlipemia   . Hypertension       . Insomnia       . Kidney stones   . Myocardial infarction ([place])   . On home oxygen therapy    "2L; just at night" (01/31/2015)  . Peripheral neuropathy ([place])   . Peripheral vascular disease (HCC)    thrombus- in leg- many yrs. ago- ?R- coumadin x1 mth  . Pneumonia 1960's  . Presence of permanent cardiac pacemaker   . Prostate cancer [facility])    radiation therapy- 2002  . Systolic CHF ([place])       . VT (ventricular tachycardia) ([place]) 06/2012   a. in the setting of STEMI b. on amio during hospitalization    Social History  Substance Use Topics  . Smoking status: Never Smoker  . Smokeless tobacco: Never Used  . Alcohol use No    Family History  Problem Relation Age of Onset  . Heart attack Mother   . Addison's disease Father   . Heart attack Sister   . Anesthesia problems Neg Hx   . Hypotension Neg Hx   . Malignant hyperthermia Neg Hx   . Pseudochol deficiency Neg Hx    No Known Allergies  OBJECTIVE: Blood pressure 128/65, pulse 66, temperature 98.1 F (36.7 C), temperature source Oral, resp. rate 16, weight 150 lb 14.4 oz (68.4 kg), SpO2 97 %.  Physical Exam  Constitutional: He is oriented to person, place, and time.  He is alert and in no distress. He can provide  great detail about his history.  HENT:  Mouth/Throat: No oropharyngeal exudate.  He has a healing incision on his left temporal area from recent temporal artery biopsy.  Eyes: Conjunctivae are normal.  Neck: Neck supple.  Cardiovascular: Normal rate and regular rhythm.   Murmur heard. 1/6 systolic murmur.  Pulmonary/Chest: Effort normal and breath sounds normal. He has no wheezes. He has no rales.  Left anterior chest pacemaker site looks good.  Abdominal: Soft. There is no tenderness.  Musculoskeletal: Normal range of motion. He exhibits no edema or tenderness.  Neurological: He is alert and oriented to person, place, and time.  Skin: No rash noted.  Scattered ecchymoses.  Psychiatric: Mood and affect normal.    Lab Results Lab Results  Component Value Date   WBC 6.1 03/22/2016   HGB 11.9 (L) 03/22/2016   HCT 35.5 (L) 03/22/2016   MCV 91.3 03/22/2016   PLT 340 03/22/2016    Lab Results  Component Value Date   CREATININE 0.86 01/16/2016   BUN 13 01/16/2016   NA 136 01/16/2016   K 4.5 01/16/2016   CL 98 01/16/2016   CO2 31 01/16/2016    Lab Results  Component Value Date   ALT 15 01/16/2016   AST 25 01/16/2016   ALKPHOS 83 01/16/2016   BILITOT 0.3 01/16/2016     Microbiology: No results found for this or any previous visit (from the past 240 hour(s)).  Michel Bickers, MD [facility] [place] [facility] [phone] pager   [phone] cell 04/27/2016, 5:00 PM

## 2016-04-27 NOTE — Telephone Encounter (Signed)
[name] will contact patient for admission, IV antibiotics, echo, probable TEE, etc [name]

## 2016-04-27 NOTE — Telephone Encounter (Signed)
Dr Lorenso Courier wanted to know what the results given for blood culture.  informed Dr Lorenso Courier of earlier conversation with lab. Voiced understanding.

## 2016-04-28 ENCOUNTER — Inpatient Hospital Stay (HOSPITAL_COMMUNITY): Payer: Medicare Other

## 2016-04-28 ENCOUNTER — Encounter (HOSPITAL_COMMUNITY)
Admission: AD | Disposition: A | Discharge status: Home Health | Payer: Self-pay | Source: Ambulatory Visit | Attending: Family Medicine

## 2016-04-28 ENCOUNTER — Encounter (HOSPITAL_COMMUNITY): Payer: Self-pay | Admitting: Cardiovascular Disease

## 2016-04-28 ENCOUNTER — Encounter: Payer: Medicare Other | Admitting: Cardiology

## 2016-04-28 DIAGNOSIS — R945 Abnormal results of liver function studies: Secondary | ICD-10-CM

## 2016-04-28 DIAGNOSIS — R7989 Other specified abnormal findings of blood chemistry: Secondary | ICD-10-CM

## 2016-04-28 DIAGNOSIS — I340 Nonrheumatic mitral (valve) insufficiency: Secondary | ICD-10-CM

## 2016-04-28 HISTORY — PX: TEE WITHOUT CARDIOVERSION: SHX5443

## 2016-04-28 LAB — BASIC METABOLIC PANEL
Anion gap: 6 (ref 5–15)
BUN: 15 mg/dL (ref 6–20)
CO2: 27 mmol/L (ref 22–32)
CREATININE: 0.8 mg/dL (ref 0.61–1.24)
Calcium: 9.4 mg/dL (ref 8.9–10.3)
Chloride: 104 mmol/L (ref 101–111)
GFR calc Af Amer: >60 mL/min (ref >60)
GLUCOSE: 103 mg/dL — AB (ref 65–99)
Potassium: 3.7 mmol/L (ref 3.5–5.1)
SODIUM: 137 mmol/L (ref 135–145)

## 2016-04-28 LAB — TROPONIN I
TROPONIN I: 0.05 ng/mL — AB (ref <0.03)
TROPONIN I: 0.04 ng/mL — AB (ref <0.03)
TROPONIN I: 0.04 ng/mL — AB (ref <0.03)
TROPONIN I: 0.05 ng/mL — AB (ref <0.03)

## 2016-04-28 LAB — URINE CULTURE: Culture: NO GROWTH

## 2016-04-28 LAB — CBC
HEMATOCRIT: 28.5 % — AB (ref 39.0–52.0)
HEMOGLOBIN: 9 g/dL — AB (ref 13.0–17.0)
MCH: 29.1 pg (ref 26.0–34.0)
MCHC: 31.6 g/dL (ref 30.0–36.0)
MCV: 92.2 fL (ref 78.0–100.0)
PLATELETS: 261 10*3/uL (ref 150–400)
RBC: 3.09 MIL/uL — AB (ref 4.22–5.81)
RDW: 15 % (ref 11.5–15.5)
WBC: 7.5 10*3/uL (ref 4.0–10.5)

## 2016-04-28 LAB — HEPATIC FUNCTION PANEL
ALT: 109 U/L — AB (ref 17–63)
AST: 153 U/L — ABNORMAL HIGH (ref 15–41)
Albumin: 2.3 g/dL — ABNORMAL LOW (ref 3.5–5.0)
Alkaline Phosphatase: 655 U/L — ABNORMAL HIGH (ref 38–126)
BILIRUBIN DIRECT: 1.5 mg/dL — AB (ref 0.1–0.5)
BILIRUBIN INDIRECT: 0.7 mg/dL (ref 0.3–0.9)
TOTAL PROTEIN: 6.8 g/dL (ref 6.5–8.1)
Total Bilirubin: 2.2 mg/dL — ABNORMAL HIGH (ref 0.3–1.2)

## 2016-04-28 SURGERY — ECHOCARDIOGRAM, TRANSESOPHAGEAL
Anesthesia: Moderate Sedation

## 2016-04-28 MED ORDER — IOPAMIDOL (ISOVUE-300) INJECTION 61%
INTRAVENOUS | Status: AC
  Administered 2016-04-28: 100 mL
  Filled 2016-04-28: qty 100

## 2016-04-28 MED ORDER — LIDOCAINE VISCOUS 2 % MT SOLN
OROMUCOSAL | Status: AC
  Filled 2016-04-28: qty 15

## 2016-04-28 MED ORDER — MIDAZOLAM HCL 10 MG/2ML IJ SOLN
INTRAMUSCULAR | Status: DC | PRN
  Administered 2016-04-28 (×5): 1 mg via INTRAVENOUS

## 2016-04-28 MED ORDER — SODIUM CHLORIDE 0.9 % IV SOLN: INTRAVENOUS | Status: DC

## 2016-04-28 MED ORDER — GI COCKTAIL ~~LOC~~
30.0000 mL | Freq: Once | ORAL | Status: AC
Start: 2016-04-28 — End: 2016-04-28
  Administered 2016-04-28: 30 mL via ORAL
  Filled 2016-04-28: qty 30

## 2016-04-28 MED ORDER — FENTANYL CITRATE (PF) 100 MCG/2ML IJ SOLN
INTRAMUSCULAR | Status: AC
  Filled 2016-04-28: qty 2

## 2016-04-28 MED ORDER — LIDOCAINE VISCOUS 2 % MT SOLN
OROMUCOSAL | Status: DC | PRN
  Administered 2016-04-28: 10 mL via OROMUCOSAL

## 2016-04-28 MED ORDER — IOPAMIDOL (ISOVUE-300) INJECTION 61%
INTRAVENOUS | Status: AC
  Administered 2016-04-28: 30 mL
  Filled 2016-04-28: qty 30

## 2016-04-28 MED ORDER — FENTANYL CITRATE (PF) 100 MCG/2ML IJ SOLN
INTRAMUSCULAR | Status: DC | PRN
  Administered 2016-04-28 (×2): 25 ug via INTRAVENOUS

## 2016-04-28 MED ORDER — MIDAZOLAM HCL 5 MG/ML IJ SOLN
INTRAMUSCULAR | Status: AC
  Filled 2016-04-28: qty 2

## 2016-04-28 NOTE — H&P (View-Only) (Signed)
ELECTROPHYSIOLOGY CONSULT NOTE    Patient ID: [name] MRN: [id number], DOB/AGE: October 10, 1924 82 y.o.  Admit date: 04/27/2016 Date of Consult: 04/27/2016  Primary Physician: Bufford Lope, DO Primary Cardiologist: Stanford Breed Electrophysiologist: Curt Bears Requesting MD: [place]  Reason for Consultation: gram negative bacteremia with implanted device  HPI:  [name] is a 82 y.o. male with a past medical history significant for CAD s/p CABG, hyperlipidemia, hypertension, sick sinus syndrome s/p PPM.  He has had fevers for the past month with intermittent chills and has been found to be strep A positive.  He then developed headaches and underwent temporal artery biopsy with results pending.  Blood cultures were drawn yesterday and have returned positive for gram negative bacteremia.    He currently denies chest pain, shortness of breath, palpitations, dizziness, syncope, or pre-syncope.   Past Medical History:  Diagnosis Date  . Anemia 2014   was on iron pills and then was able to come off  . Arthritis    "all over"  . Asthma       . Atrial flutter ([place])    a. diagnosed 03/2015  . Complication of anesthesia    B/P dropped low and had to stay in recovery longer  . Coronary artery disease    a. s/p CABG 1986 b. redo CABG 1993 c. PTCA 2008 d. DES 2010  . Depression       . Diverticulosis   . GERD (gastroesophageal reflux disease)       . Hepatitis    hepatitis- long time ago, occupational contamination   . Hiatal hernia   . Hyperlipemia   . Hypertension       . Insomnia       . Kidney stones   . On home oxygen therapy    "2L; just at night" (01/31/2015)  . Peripheral neuropathy ([place])   . Peripheral vascular disease (HCC)    thrombus- in leg- many yrs. ago- ?R- coumadin x1 mth  . Pneumonia 1960's  . Prostate cancer [facility])    radiation therapy- 2002  . Systolic CHF ([place])       . VT (ventricular tachycardia) ([place]) 06/2012   a. in the setting of STEMI b. on amio during  hospitalization     Surgical History:  Past Surgical History:  Procedure Laterality Date  . APPENDECTOMY  1943  . CATARACT EXTRACTION W/ INTRAOCULAR LENS  IMPLANT, BILATERAL    . COLONOSCOPY    . CORONARY ARTERY BYPASS GRAFT  1986;  1993   CABG X 5; CABG X 1  . CYSTOSCOPY W/ URETERAL STENT PLACEMENT Right 02/28/2013   Procedure: CYSTOSCOPY WITH RETROGRADE PYELOGRAM/URETERAL STENT PLACEMENT;  Surgeon: Dutch Gray, MD;  Location: [facility];  Service: Urology;  Laterality: Right;  . CYSTOSCOPY WITH URETEROSCOPY AND STENT PLACEMENT Right 03/23/2013   Procedure: RIGHT URETEROSCOPY, LASER LITHO AND STENT PLACEMENT;  Surgeon: Fredricka Bonine, MD;  Location: [facility];  Service: Urology;  Laterality: Right;  . EP IMPLANTABLE DEVICE N/A 04/11/2015   Procedure: Pacemaker Implant;  Surgeon: Rodolphe Edmonston Meredith Leeds, MD;  Location: [place] CV LAB;  Service: Cardiovascular;  Laterality: N/A;  . FOOT FUSION Left 2000   heel  . FRACTURE SURGERY     heel- crushed -2002,  (hardware)   . HAMMER TOE SURGERY Right    little toe  . HARDWARE REMOVAL Right 2002   great toe; Dr. Sharol Given  . HOLMIUM LASER APPLICATION Right [id number]   Procedure: HOLMIUM LASER APPLICATION;  Surgeon:  Fredricka Bonine, MD;  [facility]: [facility];  Service: Urology;  Laterality: Right;  . INGUINAL HERNIA REPAIR Left 01/31/2015   Procedure: OPEN REPAIR RECURRENT LEFT INGUINAL HERNIA;  Surgeon: Fanny Skates, MD;  Location: [place];  Service: General;  Laterality: Left;  . INSERTION OF MESH Left 01/31/2015   Procedure: INSERTION OF MESH;  Surgeon: Fanny Skates, MD;  Location: [place];  Service: General;  Laterality: Left;  . INTRAMEDULLARY (IM) NAIL INTERTROCHANTERIC Right 08/29/2013   Procedure: INTRAMEDULLARY (IM) NAIL INTERTROCHANTRIC;  Surgeon: Marianna Payment, MD;  Location: [place];  Service: Orthopedics;  Laterality: Right;  . JOINT REPLACEMENT     R great toe  . KNEE ARTHROSCOPY Right X 2  . LEFT AND RIGHT HEART CATHETERIZATION  WITH CORONARY ANGIOGRAM N/A 07/15/2012   Procedure: LEFT AND RIGHT HEART CATHETERIZATION WITH CORONARY ANGIOGRAM;  Surgeon: Jettie Booze, MD;  Location: [facility] CATH LAB;  Service: Cardiovascular;  Laterality: N/A;  . LUMBAR LAMINECTOMY/DECOMPRESSION MICRODISCECTOMY  08/10/2011   Procedure: LUMBAR LAMINECTOMY/DECOMPRESSION MICRODISCECTOMY;  Surgeon: Cooper Render Pool;  Location: [place] NEURO ORS;  Service: Neurosurgery;  Laterality: Right;  RIGHT Lumbar five-sacral one LAMINECTOMY, MICRODISCECTOMY  . LUMBAR WOUND DEBRIDEMENT  08/22/2011   Procedure: LUMBAR WOUND DEBRIDEMENT;  Surgeon: Eustace Moore;  Location: [place] NEURO ORS;  Service: Neurosurgery;  Laterality: N/A;  Repair of CSF Leak requiring laminectomy  . PERCUTANEOUS CORONARY STENT INTERVENTION (PCI-S) N/A 07/15/2012   Procedure: PERCUTANEOUS CORONARY STENT INTERVENTION (PCI-S);  Surgeon: Jettie Booze, MD;  Location: [facility] CATH LAB;  Service: Cardiovascular;  Laterality: N/A;  . RHINOPLASTY  [id number]  . TONSILLECTOMY  1940  . TRANSURETHRAL RESECTION OF PROSTATE  x2     Prescriptions Prior to Admission  Medication Sig Dispense Refill Last Dose  . acetaminophen (TYLENOL) 500 MG tablet Take 500 mg by mouth at bedtime. Take at bedtime every night per patient   Taking  . albuterol (PROVENTIL HFA;VENTOLIN HFA) 108 (90 Base) MCG/ACT inhaler Inhale 2 puffs into the lungs every 6 (six) hours as needed for wheezing or shortness of breath. 1 Inhaler 0 Taking  . aspirin EC 81 MG EC tablet Take 1 tablet (81 mg total) by mouth daily.   Taking  . atorvastatin (LIPITOR) 40 MG tablet Take 40 mg by mouth daily at 6 PM.   Taking  . budesonide-formoterol (SYMBICORT) 80-4.5 MCG/ACT inhaler Inhale 2 puffs into the lungs 2 (two) times daily.   Taking  . calcium-vitamin D (OSCAL WITH D) 500-200 MG-UNIT per tablet Take 2 tablets by mouth daily.   Taking  . carbamazepine (TEGRETOL) 100 MG chewable tablet Chew 300 mg by mouth 2 (two) times daily.   Taking  .  clopidogrel (PLAVIX) 75 MG tablet Take 1 tablet (75 mg total) by mouth daily.   Taking  . docusate sodium (COLACE) 100 MG capsule Take 100 mg by mouth daily.   Taking  . finasteride (PROSCAR) 5 MG tablet Take 5 mg by mouth daily.    Taking  . furosemide (LASIX) 40 MG tablet Take 40 mg by mouth every morning.   Taking  . ipratropium (ATROVENT HFA) 17 MCG/ACT inhaler Inhale 2 puffs into the lungs 2 (two) times daily as needed for wheezing.    Taking  . isosorbide mononitrate (IMDUR) 60 MG 24 hr tablet Take 90 mg by mouth daily.    Taking  . metoprolol succinate (TOPROL-XL) 12.5 mg TB24 24 hr tablet Take 12.5 mg by mouth daily.   Taking  . Multiple Vitamins-Minerals (MULTIVITAMIN  WITH MINERALS) tablet Take 1 tablet by mouth every evening.    Taking  . NITROSTAT 0.4 MG SL tablet PLACE 1 TABLET UNDER THE TONGUE EVERY 5 MINUTES ASDIRECTED BY PHYSICIAN 25 tablet 3 Taking  . potassium chloride SA (K-DUR,KLOR-CON) 20 MEQ tablet Take 20 mEq by mouth daily.    Taking  . predniSONE (DELTASONE) 20 MG tablet Take 3 tablets (60 mg total) by mouth daily with breakfast. 90 tablet 0 Taking  . predniSONE (STERAPRED UNI-PAK 21 TAB) 10 MG (21) TBPK tablet Take as directed. 21 tablet 0 Taking  . ranitidine (ZANTAC) 150 MG tablet TAKE 1 TABLET BY MOUTH 2 TIMES DAILY. 180 tablet 3 Taking  . senna (SENOKOT) 8.6 MG tablet Take 1 tablet by mouth daily.    Taking  . sertraline (ZOLOFT) 100 MG tablet Take 150 mg by mouth See admin instructions. 1 AND 1/2 TABLET BY MOUTH DAILY   Taking  . tamsulosin (FLOMAX) 0.4 MG CAPS capsule Take 1 capsule (0.4 mg total) by mouth daily. 30 capsule 0 Taking    Inpatient Medications:  . acetaminophen  500 mg Oral QHS  . aspirin  81 mg Oral q1800  . atorvastatin  40 mg Oral q1800  . calcium-vitamin D  2 tablet Oral Daily  . carbamazepine  300 mg Oral BID  . clopidogrel  75 mg Oral q1800  . enoxaparin (LOVENOX) injection  40 mg Subcutaneous Q24H  . famotidine  20 mg Oral Daily  . [START  ON 04/28/2016] finasteride  5 mg Oral Daily  . [START ON 04/28/2016] isosorbide mononitrate  90 mg Oral Daily  . [START ON 04/28/2016] metoprolol tartrate  12.5 mg Oral BID  . mometasone-formoterol  2 puff Inhalation BID  . piperacillin-tazobactam (ZOSYN)  IV  3.375 g Intravenous Q8H  . tamsulosin  0.4 mg Oral QPC supper    Allergies: No Known Allergies  Social History   Social History  . Marital status: Married    Spouse name: N/A  . Number of children: N/A  . Years of education: N/A   Occupational History  . Not on file.   Social History Main Topics  . Smoking status: Never Smoker  . Smokeless tobacco: Never Used  . Alcohol use No  . Drug use: No  . Sexual activity: Not on file   Other Topics Concern  . Not on file   Social History Narrative  . No narrative on file     Family History  Problem Relation Age of Onset  . Heart attack Mother   . Addison's disease Father   . Heart attack Sister   . Anesthesia problems Neg Hx   . Hypotension Neg Hx   . Malignant hyperthermia Neg Hx   . Pseudochol deficiency Neg Hx      Review of Systems: All other systems reviewed and are otherwise negative except as noted above.  Physical Exam: Vitals:   04/27/16 1200  BP: 128/65  Pulse: 66  Resp: 16  Temp: 98.1 F (36.7 C)  TempSrc: Oral  SpO2: 97%    GEN- The patient is elderly and chronically ill appearing, alert and oriented x 3 today.   HEENT: normocephalic, atraumatic; sclera clear, conjunctiva pink; hearing intact; oropharynx clear; neck supple  Lungs- Clear to ausculation bilaterally, normal work of breathing.  No wheezes, rales, rhonchi Heart- Regular rate and rhythm, 2/6 SEM GI- soft, non-tender, non-distended, bowel sounds present  Extremities- no clubbing, cyanosis, or edema  MS- no significant deformity or atrophy Skin- warm  and dry, no rash or lesion, pacemaker site without redness/edema Psych- euthymic mood, full affect Neuro- strength and sensation are  intact  Labs:   Lab Results  Component Value Date   WBC 6.1 03/22/2016   HGB 11.9 (L) 03/22/2016   HCT 35.5 (L) 03/22/2016   MCV 91.3 03/22/2016   PLT 340 03/22/2016   No results for input(s): NA, K, CL, CO2, BUN, CREATININE, CALCIUM, PROT, BILITOT, ALKPHOS, ALT, AST, GLUCOSE in the last 168 hours.  Invalid input(s): LABALBU    Radiology/Studies: No results found.  [id number]  TELEMETRY: sinus rhythm  DEVICE HISTORY: MDT dual chamber PPM implanted 03/2015 for sinus node dysfunction   Assessment/Plan: 1.  Gram negative bacteremia  ID input appreciated Plan for TEE tomorrow - risks, benefits discussed with patient who agrees to proceed If no vegetation on pacemaker leads, Colton Engdahl not plan extraction  2.  SSS Kamaree Berkel have device formally interrogated this admission  3.  CAD s/p CABG No recent ischemic symptoms Continue medical therapy   4.  HTN Stable No change required today  5.  Atrial flutter Amarea Macdowell evaluate burden by device interrogation today Not felt to be a candidate for [facility] with falls and prior syncope   Signed, Chanetta Marshall, NP 04/27/2016 1:54 PM      I have seen and examined this patient with Chanetta Marshall.  Agree with above, note added to reflect my findings.  On exam, regular rhythm, no murmurs, lungs clear.  Has been feeling poorly for the last month and found to have gram - bacteremia.  Plan for TEE tomorrow to rule out vegetation.  If he has no vegetation, may not require device extraction.  Elmor Kost M. Loriann Bosserman MD 04/27/2016 3:52 PM

## 2016-04-28 NOTE — Progress Notes (Signed)
Patient complains of chest pain, oriented to left side. Describes pain as burning, rates 3/10. States this feels "like indigestion" . Dr. Curt Bears at bedside at this time to see patient. He was notified of this. Verbal order received for GI cocktail x1 dose after TEE today. Patient agrees with this.

## 2016-04-28 NOTE — CV Procedure (Signed)
Brief TEE Note  LVEF 55-60%  Mild MR, trivial AR, TR, PR No vegetation or thrombus noted on the valves, RA or RV leads. No LA/LAA thrombus   During this procedure the patient is administered a total of Versed 5 mg and Fentanyl 50 mcg to achieve and maintain moderate conscious sedation.  The patient's heart rate, blood pressure, and oxygen saturation are monitored continuously during the procedure. The period of conscious sedation is 40 minutes, of which I was present face-to-face 100% of this time.   For additional details see full report.  [name] C. Oval Linsey, MD, [facility]  04/28/2016  9:41 AM

## 2016-04-28 NOTE — Interval H&P Note (Signed)
History and Physical Interval Note:  04/28/2016 8:13 AM  [name]  has presented today for surgery, with the diagnosis of bacteremia  The various methods of treatment have been discussed with the patient and family. After consideration of risks, benefits and other options for treatment, the patient has consented to  Procedure(s): TRANSESOPHAGEAL ECHOCARDIOGRAM (TEE) (N/A) as a surgical intervention .  The patient's history has been reviewed, patient examined, no change in status, stable for surgery.  I have reviewed the patient's chart and labs.  Questions were answered to the patient's satisfaction.     Skeet Latch, MD 04/28/2016  8:13 AM

## 2016-04-28 NOTE — Progress Notes (Signed)
  Echocardiogram Echocardiogram Transesophageal has been performed.  Darlina Sicilian M 04/28/2016, 9:54 AM

## 2016-04-28 NOTE — Progress Notes (Signed)
SUBJECTIVE: The patient is doing well today.  At this time, he denies chest pain, shortness of breath, or any new concerns.  [name] acetaminophen  1,000 mg Oral QHS  . aspirin EC  81 mg Oral q1800  . atorvastatin  40 mg Oral q1800  . carbamazepine  300 mg Oral BID  . ceFEPime (MAXIPIME) IV  2 g Intravenous Q12H  . clopidogrel  75 mg Oral q1800  . enoxaparin (LOVENOX) injection  40 mg Subcutaneous Q24H  . famotidine  20 mg Oral Daily  . feeding supplement (ENSURE ENLIVE)  237 mL Oral BID BM  . finasteride  5 mg Oral Daily  . gi cocktail  30 mL Oral Once  . metoprolol tartrate  12.5 mg Oral BID  . mometasone-formoterol  2 puff Inhalation BID  . predniSONE  40 mg Oral Q breakfast  . sertraline  150 mg Oral Daily  . tamsulosin  0.4 mg Oral QPC supper      OBJECTIVE: Physical Exam: Vitals:   04/28/16 0935 04/28/16 0940 04/28/16 0945 04/28/16 0955  BP: (!) 151/51 (!) 161/63 (!) 164/58 (!) 160/58  Pulse: (!) 59 (!) 59 65 60  Resp: 19 18 15 19   Temp:      TempSrc:      SpO2: 99% 95% 97% 97%  Weight:      Height:        Intake/Output Summary (Last 24 hours) at 04/28/16 1021 Last data filed at 04/28/16 [id number]  Gross per 24 hour  Intake          1018.33 ml  Output             1250 ml  Net          -231.67 ml    Telemetry reveals Sinus, paced rhythm  GEN- The patient is well appearing, alert and oriented x 3 today.   Head- normocephalic, atraumatic Eyes-  Sclera clear, conjunctiva pink Ears- hearing intact Oropharynx- clear Neck- supple, no JVP Lungs- Clear to ausculation bilaterally, normal work of breathing Heart- Regular rate and rhythm, no significant murmurs, no rubs or gallops GI- soft, NT, ND Extremities- no clubbing, cyanosis, or edema Skin- no rash or lesion Psych- euthymic mood, full affect Neuro- no gross deficits appreciated  LABS: Basic Metabolic Panel:  Recent Labs  04/27/16 1840 04/28/16 0258  NA 134* 137  K 3.7 3.7  CL 101 104  CO2 26 27    GLUCOSE 113* 103*  BUN 15 15  CREATININE 0.86 0.80  CALCIUM 9.5 9.4   Liver Function Tests:  Recent Labs  04/27/16 1840  AST 148*  ALT 110*  ALKPHOS 643*  BILITOT 1.3*  PROT 6.7  ALBUMIN 2.3*   CBC:  Recent Labs  04/27/16 1747 04/27/16 1840 04/28/16 0258  WBC 9.0 8.9 7.5  NEUTROABS 7.4  --   --   HGB 9.4* 9.0* 9.0*  HCT 29.5* 28.2* 28.5*  MCV 92.8 92.2 92.2  PLT 289 280 261   Cardiac Enzymes:  Recent Labs  04/27/16 1840 04/27/16 2044 04/28/16 0258  TROPONINI 0.05* 0.04* 0.05*     ASSESSMENT AND PLAN:   1.  Gram negative bacteremia  ID input appreciated TEE this morning, pending findings If no vegetation on pacemaker leads, Merrily Tegeler not plan extraction, pending further ID recommendations  2.  SSS for device check today  3.  CAD s/p CABG No recent ischemic symptoms Continue medical therapy   4.  HTN Stable No change required today  5.  Atrial flutter Jermone Geister evaluate burden with device check Not felt to be a candidate for [facility] with falls and prior syncope     Tommye Standard, PA-C 04/28/2016 10:21 AM  I have seen and examined this patient with Tommye Standard.  Agree with above, note added to reflect my findings.  On exam, regular rhythm, no murmurs, lungs clear. Had TEE showing no evidence of vegetation on pacemaker leads or valves.  Would continue current management with antibiotics including.  No current indication for device extraction.  Roger Kettles M. Taiwo Fish MD 04/28/2016 1:59 PM

## 2016-04-28 NOTE — Progress Notes (Signed)
Patient ID: [name], male   DOB: Feb 21, 1925, 82 y.o.   MRN: [id number]          [facility]    Date of Admission:  04/27/2016           Day 2 cefepime  Principal Problem:   Bacteremia due to Gram-negative bacteria (HCC) Active Problems:   Pacemaker   BPH (benign prostatic hyperplasia)   Asthma, mild intermittent   . acetaminophen  1,000 mg Oral QHS  . aspirin EC  81 mg Oral q1800  . atorvastatin  40 mg Oral q1800  . carbamazepine  300 mg Oral BID  . ceFEPime (MAXIPIME) IV  2 g Intravenous Q12H  . clopidogrel  75 mg Oral q1800  . enoxaparin (LOVENOX) injection  40 mg Subcutaneous Q24H  . famotidine  20 mg Oral Daily  . feeding supplement (ENSURE ENLIVE)  237 mL Oral BID BM  . finasteride  5 mg Oral Daily  . metoprolol tartrate  12.5 mg Oral BID  . mometasone-formoterol  2 puff Inhalation BID  . predniSONE  40 mg Oral Q breakfast  . sertraline  150 mg Oral Daily  . tamsulosin  0.4 mg Oral QPC supper    SUBJECTIVE: He is feeling a little bit better today.  Review of Systems: Review of Systems  Constitutional: Negative for chills and fever.  Respiratory: Negative for cough.   Gastrointestinal: Negative for abdominal pain, diarrhea, nausea and vomiting.  Genitourinary: Negative for dysuria.  Neurological: Negative for headaches.    Past Medical History:  Diagnosis Date  . Anemia 2014   was on iron pills and then was able to come off  . Arthritis    "all over"  . Asthma       . Atrial flutter ([place])    a. diagnosed 03/2015  . Bacteremia 03/2016  . Complication of anesthesia    B/P dropped low and had to stay in recovery longer  . Coronary artery disease    a. s/p CABG 1986 b. redo CABG 1993 c. PTCA 2008 d. DES 2010  . Depression       . Diverticulosis   . GERD (gastroesophageal reflux disease)       . Hepatitis    hepatitis- long time ago, occupational contamination   . Hiatal hernia   . Hyperlipemia   . Hypertension       . Insomnia       . Kidney stones   . Myocardial infarction ([place])   . On home oxygen therapy    "2L; just at night" (01/31/2015)  . Peripheral neuropathy ([place])   . Peripheral vascular disease (HCC)    thrombus- in leg- many yrs. ago- ?R- coumadin x1 mth  . Pneumonia 1960's  . Presence of permanent cardiac pacemaker   . Prostate cancer [facility])    radiation therapy- 2002  . Systolic CHF ([place])       . VT (ventricular tachycardia) ([place]) 06/2012   a. in the setting of STEMI b. on amio during hospitalization    Social History  Substance Use Topics  . Smoking status: Never Smoker  . Smokeless tobacco: Never Used  . Alcohol use No    Family History  Problem Relation Age of Onset  . Heart attack Mother   . Addison's disease Father   . Heart attack Sister   . Anesthesia problems Neg Hx   . Hypotension Neg Hx   . Malignant hyperthermia Neg Hx   .  Pseudochol deficiency Neg Hx    No Known Allergies  OBJECTIVE: Vitals:   04/28/16 0940 04/28/16 0945 04/28/16 0955 04/28/16 1217  BP: (!) 161/63 (!) 164/58 (!) 160/58 (!) 146/68  Pulse: (!) 59 65 60 63  Resp: 18 15 19 20   Temp:    98.6 F (37 C)  TempSrc:    Oral  SpO2: 95% 97% 97% 95%  Weight:      Height:       Body mass index is 21.8 kg/m.  Physical Exam  Constitutional: He is oriented to person, place, and time.  He is in good spirits sitting on the side of his bed.  Cardiovascular:  Pacemaker site looks good  Pulmonary/Chest: Effort normal and breath sounds normal.  Abdominal: Soft. There is no tenderness.  Musculoskeletal: Normal range of motion. He exhibits no edema or tenderness.  Neurological: He is alert and oriented to person, place, and time.  Skin: No rash noted.  Psychiatric: Mood and affect normal.    Lab Results Lab Results  Component Value Date   WBC 7.5 04/28/2016   HGB 9.0 (L) 04/28/2016   HCT 28.5 (L) 04/28/2016   MCV 92.2 04/28/2016   PLT 261 04/28/2016    Lab Results  Component Value Date    CREATININE 0.80 04/28/2016   BUN 15 04/28/2016   NA 137 04/28/2016   K 3.7 04/28/2016   CL 104 04/28/2016   CO2 27 04/28/2016    Lab Results  Component Value Date   ALT 109 (H) 04/28/2016   AST 153 (H) 04/28/2016   ALKPHOS 655 (H) 04/28/2016   BILITOT 2.2 (H) 04/28/2016     Microbiology: Recent Results (from the past 240 hour(s))  Culture, blood (routine x 2)     Status: None (Preliminary result)   Collection Time: 04/27/16  5:22 PM  Result Value Ref Range Status   Specimen Description BLOOD LEFT ARM  Final   Special Requests BOTTLES DRAWN AEROBIC AND ANAEROBIC 5CC EA  Final   Culture PENDING  Incomplete   Report Status PENDING  Incomplete  Culture, blood (routine x 2)     Status: None (Preliminary result)   Collection Time: 04/27/16  5:32 PM  Result Value Ref Range Status   Specimen Description BLOOD RIGHT ARM  Final   Special Requests BOTTLES DRAWN AEROBIC AND ANAEROBIC 5CC EA  Final   Culture PENDING  Incomplete   Report Status PENDING  Incomplete     ASSESSMENT: There is no evidence of endocarditis or pacemaker infection by TEE this morning. His outpatient blood cultures are growing a mucoid gram-negative rod in the Enterobacteracea family (probably Klebsiella). Blood cultures here are negative so far. I note that his liver enzymes are quite elevated. I will order a CT of his abdomen and pelvis looking for occult abscess.  PLAN: 1. Continue cefepime pending final blood culture results 2. CT of abdomen and pelvis 3. Recommend stopping prednisone  Michel Bickers, MD [facility] [place] [phone] pager   [phone] cell 04/28/2016, 1:15 PM

## 2016-04-28 NOTE — Progress Notes (Signed)
Family Medicine Teaching Service Daily Progress Note Intern Pager: [phone]  Patient name: [name] Medical record number: [id number] Date of birth: 1925-07-06 Age: 82 y.o. Gender: male  Primary Care Provider: Bufford Lope, DO Consultants: Cardiology Code Status: FULL  Pt Overview and Major Events to Date:  Admit 8/29  Assessment and Plan: [name] is a 82 y.o. male found to have bacteremia with fatigue, new dyspnea on exertion, new murmur. PMH is significant for HTN, CAD s/p CABG 1986 with redo 1993, PTCA 2008, and DES 2010, hx MI, LBBB, HFrEF (but recent ECHO 08/2014 with normal EF 55-60%, G1DD), Asthma/COPD, GERD, OA, Osteoporosis, Dyslipidemia, Peripheral Neuropathy, BPH with history or urinary retention, Pacemaker for tachy brady syndrome (03/2015), History of atrial flutter (no anticoagulation due to falls and anemia)   #Bacteremia: 2/2 blood cultures positive for gram negative rods resulted in less than 24hrs. Cards office following. Patient with pacemaker which puts him at a higher risk for endocarditis. No evidence of pneumonia by exam or CXR. Sx do not suggest a urinary tract infection and UA showing rare bacteria, neg LE, neg nitrite, with granular casts. Vitals stable.  Lactic acid 1.3 normal. No white count. Has remained afebrile since admission. - Cardiology service to work on getting labwork from cardiology office - Follow blood cxs obtained here prior to starting abx -Ordered repeat Bcx for tomorrow 5am (after abx) to test for resolution of infection - Day #2 Cefepime  per pharm and ID rec - CBC wnl, CMP wnl, no white count - urine cx pending, given organism was a gram negative rod  - Plan for EP to do TEE today 8/30 to r/o vegetation. If no vegetations on pacemaker leads, will not plan extraction. -if no obvious sources, consider CT scan A/P to look for occult abscess (ordered) - f/u cardiology and ID recs  #Intermittent Chest Pain with H/o CAD s/p stent  placement: Reports he has similar symptoms at baseline but has worsened during the past few week. No chest pain currently. - EKG showing sinus rhythm with LBBB but stable and unchanged from previous in May 2017 - troponin x 1 0.05, 0.04, 0.05 last at 3am  - ASA and Plavix - Lipitor  #Elevated LFTs. AST 148, ALT 110 elevated at admission. -LFTs ordered this am. AST 153, ALT 109.   #Chronic Steroid Use: Started on daily prednisone on August 7th for concern for temporal arteritis given Sed rate 41. Biopsy was negative. Started taper 8/29 as outpatient (from Prednisone 60mg  to 40mg  daily). - Prednisone 40mg  daily and taper down. (40mg x3days, 20mg x3days,10mg x3days).  #Hypertension: normotensive  - Imdur and metoprolol   #Hx of Atrial flutter: not on anticoagulation due to falls and anemia - metoprolol   -Cardiology to evaluate burden by device interrogation today 8/30. Not felt to be a candidate for oral anticoagulation with hx of falls and prior syncope  #Systolic CHF: Clinically mildly dry. On Lasix 40mg  daily and Kdur at home. - CMP - holding Lasix at least for today since clinically appeared dry on exam this afternoon. - daily weights  #Depression: - continue Zoloft   #HLD: - continue Lipitor  #Asthma/COPD: -continue Symbicort and albuterol PRN  #Peripheral Neuropathy:  - continue Tegretol   #BPH with history of urinary retention:   - cont finasteride and Flomax   #Osteoporosis:  - Calcium-Vit D   FEN/GI: heart healthy Prophylaxis: Lovenox   Disposition: Admitted for evaluation of bacteremia.   Subjective:  Patient states he feels a little  better today. No complaints at current time.  Objective: Temp:  [98 F (36.7 C)-98.3 F (36.8 C)] 98 F (36.7 C) (08/30 0505) Pulse Rate:  [46-66] 61 (08/30 0505) Resp:  [16-18] 18 (08/30 0505) BP: (128-146)/(59-70) 140/70 (08/30 0505) SpO2:  [97 %-98 %] 98 % (08/30 0505) Weight:  [150 lb 14.4 oz (68.4 kg)-151 lb  14.4 oz (68.9 kg)] 151 lb 14.4 oz (68.9 kg) (08/30 0505) Physical Exam: Gen: pleasant elderly male, NAD HEENT: PERRL, EOMI, normocephalic, pupils equal, MMM Cardiac: RRR, 2/6 systolic murmur, no heaves/thrills, normal DP pulses Resp: CTAB, normal effort on RA Abd: soft, +bs, no tenderness Ext: no edema noted Skin: multiple hematomas of arms and legs, no skin breakdown/ulcers noted Psych: appropriate affect with normal mood  Laboratory:  Recent Labs Lab 04/27/16 1747 04/27/16 1840 04/28/16 0258  WBC 9.0 8.9 7.5  HGB 9.4* 9.0* 9.0*  HCT 29.5* 28.2* 28.5*  PLT 289 280 261    Recent Labs Lab 04/27/16 1840 04/28/16 0258  NA 134* 137  K 3.7 3.7  CL 101 104  CO2 26 27  BUN 15 15  CREATININE 0.86 0.80  CALCIUM 9.5 9.4  PROT 6.7  --   BILITOT 1.3*  --   ALKPHOS 643*  --   ALT 110*  --   AST 148*  --   GLUCOSE 113* 103*   Imaging/Diagnostic Tests:  Dg Chest 2 View  Result Date: 04/27/2016 CLINICAL DATA:  Short of breath.  Hypertension.  History of asthma. 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 The cardiac silhouette is top-normal in size. No mediastinal or hilar masses or evidence of adenopathy. Mild linear scarring at the lung bases. Lungs otherwise clear. No pleural effusion or pneumothorax. Left anterior chest wall sequential pacemaker is stable and well positioned. Bony thorax is demineralized but grossly intact. IMPRESSION: No acute cardiopulmonary disease. Stable appearance from the prior study. Electronically Signed   By: [name] M.D.   On: 04/27/2016 15:31   Shane Kim, MD 04/28/2016, 6:51 AM PGY-1, [place] Intern pager: [phone], text pages welcome

## 2016-04-29 DIAGNOSIS — R7989 Other specified abnormal findings of blood chemistry: Secondary | ICD-10-CM

## 2016-04-29 DIAGNOSIS — E440 Moderate protein-calorie malnutrition: Secondary | ICD-10-CM | POA: Insufficient documentation

## 2016-04-29 DIAGNOSIS — K831 Obstruction of bile duct: Secondary | ICD-10-CM

## 2016-04-29 LAB — HEPATIC FUNCTION PANEL
ALT: 129 U/L — AB (ref 17–63)
AST: 201 U/L — AB (ref 15–41)
Albumin: 2.3 g/dL — ABNORMAL LOW (ref 3.5–5.0)
Alkaline Phosphatase: 783 U/L — ABNORMAL HIGH (ref 38–126)
Bilirubin, Direct: 1.7 mg/dL — ABNORMAL HIGH (ref 0.1–0.5)
Indirect Bilirubin: 0.8 mg/dL (ref 0.3–0.9)
TOTAL PROTEIN: 6.9 g/dL (ref 6.5–8.1)
Total Bilirubin: 2.5 mg/dL — ABNORMAL HIGH (ref 0.3–1.2)

## 2016-04-29 LAB — CBC
HEMATOCRIT: 24.5 % — AB (ref 39.0–52.0)
Hemoglobin: 7.8 g/dL — ABNORMAL LOW (ref 13.0–17.0)
MCH: 29.1 pg (ref 26.0–34.0)
MCHC: 31.8 g/dL (ref 30.0–36.0)
MCV: 91.4 fL (ref 78.0–100.0)
PLATELETS: 268 10*3/uL (ref 150–400)
RBC: 2.68 MIL/uL — ABNORMAL LOW (ref 4.22–5.81)
RDW: 14.9 % (ref 11.5–15.5)
WBC: 8.5 10*3/uL (ref 4.0–10.5)

## 2016-04-29 LAB — TROPONIN I
TROPONIN I: 0.05 ng/mL — AB (ref <0.03)
TROPONIN I: 0.04 ng/mL — AB (ref <0.03)
TROPONIN I: 0.04 ng/mL — AB (ref <0.03)
Troponin I: 0.05 ng/mL (ref <0.03)

## 2016-04-29 LAB — BASIC METABOLIC PANEL
Anion gap: 7 (ref 5–15)
BUN: 18 mg/dL (ref 6–20)
CALCIUM: 9.4 mg/dL (ref 8.9–10.3)
CO2: 26 mmol/L (ref 22–32)
CREATININE: 0.76 mg/dL (ref 0.61–1.24)
Chloride: 101 mmol/L (ref 101–111)
GFR calc Af Amer: >60 mL/min (ref >60)
GLUCOSE: 102 mg/dL — AB (ref 65–99)
Potassium: 3.6 mmol/L (ref 3.5–5.1)
Sodium: 134 mmol/L — ABNORMAL LOW (ref 135–145)

## 2016-04-29 LAB — CULTURE, BLOOD (SINGLE)

## 2016-04-29 LAB — PREPARE RBC (CROSSMATCH)

## 2016-04-29 MED ORDER — PREDNISONE 20 MG PO TABS
20.0000 mg | ORAL_TABLET | Freq: Every day | ORAL | Status: DC
  Administered 2016-04-30 – 2016-05-01 (×2): 20 mg via ORAL
  Filled 2016-04-29 (×2): qty 1

## 2016-04-29 MED ORDER — PRO-STAT SUGAR FREE PO LIQD
30.0000 mL | Freq: Two times a day (BID) | ORAL | Status: DC
  Administered 2016-04-29 – 2016-05-02 (×6): 30 mL via ORAL
  Filled 2016-04-29 (×6): qty 30

## 2016-04-29 MED ORDER — DEXTROSE 5 % IV SOLN
2.0000 g | INTRAVENOUS | Status: DC
  Administered 2016-04-29 – 2016-05-01 (×3): 2 g via INTRAVENOUS
  Filled 2016-04-29 (×5): qty 2

## 2016-04-29 MED ORDER — SODIUM CHLORIDE 0.9 % IV SOLN
INTRAVENOUS | Status: DC
  Administered 2016-04-29: 23:00:00 via INTRAVENOUS

## 2016-04-29 MED ORDER — SODIUM CHLORIDE 0.9 % IV SOLN
Freq: Once | INTRAVENOUS | Status: AC
  Administered 2016-04-29: 10 mL/h via INTRAVENOUS

## 2016-04-29 NOTE — Progress Notes (Signed)
Family Medicine Teaching Service Daily Progress Note Intern Pager: [phone]  Patient name: [name] Medical record number: [id number] Date of birth: [birth year] Age: 82 y.o. Gender: male  Primary Care Provider: Bufford Lope, DO Consultants: Cardiology Code Status: FULL  Pt Overview and Major Events to Date:  Admit 8/29 D/C Cefepime 8/31  Assessment and Plan: [name] is a 82 y.o. male found to have bacteremia with fatigue, new dyspnea on exertion, new murmur. PMH is significant for HTN, CAD s/p CABG 1986 with redo 1993, PTCA 2008, and DES 2010, hx MI, LBBB, HFrEF (but recent ECHO 08/2014 with normal EF 55-60%, G1DD), Asthma/COPD, GERD, OA, Osteoporosis, Dyslipidemia, Peripheral Neuropathy, BPH with history or urinary retention, Pacemaker for tachy brady syndrome (03/2015), History of atrial flutter (no anticoagulation due to falls and anemia)   #Bacteremia: 2/2 blood cultures positive for gram negative rods resulted in less than 24hrs. Cards office following. Patient with pacemaker which puts him at a higher risk for endocarditis.  Lactic acid 1.3 normal. No white count. Has remained afebrile since admission. -One bottle of his bcx from cardiologist office grew Klebsiella (not able to follow these cxs in EPIC). Per ID rec from Dr. Megan Salon start IV CTX, transition to po abx for a total of 14 days of abx tx. Awaiting sensitivities. Discontinued Cefepime 8/31. - Follow blood cxs obtained here prior to starting abx >> [id number] -Repeat Bcx collected this am - urine cx pending, given organism was a gram negative rod  - TEE 8/30 showed no vegetations. Per cards recs, if no vegetations on pacemaker leads, will not plan extraction. -CT scan A/P to look for occult abscess. Showed findings worrisome for biliary obstruction secondary to a mass at the distal common bile duct. Direct bili high at 1.5. Total bili elevated as well 2.2. Gallbladder wall thickening is likely related to biliary  obstruction -Consulted GI 8/31, appreciate recs - f/u cardiology and ID recs  #Low Hgb  -CBC this am with Hgb 7.8, patient with transfusion threshold of 8.0 -Give 1 unit pRBCs today -follow post-transfusion HH  #Intermittent Chest Pain with H/o CAD s/p stent placement: Stable. Reports he has similar symptoms at baseline but has worsened during the past few week. No chest pain currently. - EKG showing sinus rhythm with LBBB but stable and unchanged from previous in May 2017 - troponins stable at 0.05. Monitor.  - ASA and Plavix - Lipitor  #Elevated LFTs. AST 148, ALT 110 elevated at admission. -LFTs ordered this am. AST 153, ALT 109.   #Chronic Steroid Use: Started on daily prednisone on August 7th for concern for temporal arteritis given Sed rate 41. Biopsy was negative. Started taper 8/29 as outpatient (from Prednisone 60mg  to 40mg  daily). - Prednisone 40mg  daily and taper down. (40mg x3days, 20mg x3days,10mg x3days).  #Hypertension: Hypertensive yesterday to 160s SBP but normotensive this am.  -Continue  Imdur and metoprolol   #Hx of Atrial flutter: not on anticoagulation due to falls and anemia - metoprolol   -Cardiology to evaluate burden by device interrogation today 8/30. Not felt to be a candidate for oral anticoagulation with hx of falls and prior syncope  #Systolic CHF: Clinically mildly dry. On Lasix 40mg  daily and Kdur at home. - CMP - holding Lasix for now since clinically appeared dry on exam this afternoon. - daily weights  #Depression: - continue Zoloft   #HLD: - continue Lipitor  #Asthma/COPD: -continue Symbicort and albuterol PRN  #Peripheral Neuropathy:  - continue Tegretol   #BPH with history  of urinary retention:   - cont finasteride and Flomax   #Osteoporosis:  - Calcium-Vit D   FEN/GI: heart healthy Prophylaxis: Lovenox   Disposition: Admitted for evaluation of bacteremia. Dispo pending clinical improvement.  Subjective:  Patient  states he feels much better today. No fatigue, abdominal pain, chest pain or sob at current time.  Objective: Temp:  [97.3 F (36.3 C)-98.6 F (37 C)] 97.3 F (36.3 C) (08/31 1208) Pulse Rate:  [62-69] 63 (08/31 1208) Resp:  [18-20] 18 (08/31 1208) BP: (114-165)/(60-77) 149/70 (08/31 1208) SpO2:  [94 %-100 %] 100 % (08/31 1208) Weight:  [151 lb 3.2 oz (68.6 kg)] 151 lb 3.2 oz (68.6 kg) (08/31 0610) Physical Exam: Gen: pleasant elderly male, NAD sitting up in hospital bed. HEENT: PERRL, EOMI, normocephalic, pupils equal, MMM Cardiac: RRR, 2/6 systolic murmur, no heaves/thrills, normal DP pulses Resp: CTAB, normal effort on RA Abd: soft, +bs, no tenderness Ext: no edema noted Skin: multiple hematomas of arms and legs, no skin breakdown/ulcers noted Psych: appropriate affect with normal mood  Laboratory:  Recent Labs Lab 04/27/16 1840 04/28/16 0258 04/29/16 0158  WBC 8.9 7.5 8.5  HGB 9.0* 9.0* 7.8*  HCT 28.2* 28.5* 24.5*  PLT 280 261 268    Recent Labs Lab 04/27/16 1840 04/28/16 0258 04/28/16 1014 04/29/16 0158 04/29/16 0802  NA 134* 137  --  134*  --   K 3.7 3.7  --  3.6  --   CL 101 104  --  101  --   CO2 26 27  --  26  --   BUN 15 15  --  18  --   CREATININE 0.86 0.80  --  0.76  --   CALCIUM 9.5 9.4  --  9.4  --   PROT 6.7  --  6.8  --  6.9  BILITOT 1.3*  --  2.2*  --  2.5*  ALKPHOS 643*  --  655*  --  783*  ALT 110*  --  109*  --  129*  AST 148*  --  153*  --  201*  GLUCOSE 113* 103*  --  102*  --    Imaging/Diagnostic Tests:  Ct Abdomen Pelvis W Contrast  Result Date: 04/28/2016 CLINICAL DATA:  Epigastric pain EXAM: CT ABDOMEN AND PELVIS WITH CONTRAST TECHNIQUE: Multidetector CT imaging of the abdomen and pelvis was performed using the standard protocol following bolus administration of intravenous contrast. CONTRAST:  131mL ISOVUE-300 IOPAMIDOL (ISOVUE-300) INJECTION 61%, 24mL ISOVUE-300 IOPAMIDOL (ISOVUE-300) INJECTION 61% COMPARISON:  02/28/2013  FINDINGS: Dependent atelectasis at the lung bases. Marked intra and extrahepatic biliary dilatation is present. There is a soft tissue filling defect in the distal common bile duct. Cholangiocarcinoma is not excluded. Pancreatic ducts are also very dilated. Tiny hypodensity in the right lobe of liver on image 30 is stable There is gallbladder wall thickening. The gallbladder is mildly distended. This is likely related to biliary obstruction. Spleen is unremarkable Adrenal glands are within normal limits Several benign appearing renal cysts are stable. Sigmoid diverticulosis. No evidence of acute diverticulitis. No obvious mass in the colon. Prominent stool burden within the colon. No evidence of small-bowel obstruction Bladder is unremarkable.  Postoperative changes in the prostate. Atherosclerotic and iliac artery atherosclerotic calcifications. No abnormal retroperitoneal adenopathy Anterolisthesis L5 upon S1. Postoperative changes at this level. These findings are stable. No vertebral compression deformity. IMPRESSION: Findings above are worrisome for biliary obstruction secondary to a mass at the distal common bile duct. Correlation with bilirubin  levels is recommended. Gallbladder wall thickening is likely related to biliary obstruction. Electronically Signed   By: Marybelle Killings M.D.   On: 04/28/2016 22:15   Lovenia Kim, MD 04/29/2016, 12:15 PM PGY-1, [place] Intern pager: [phone], text pages welcome

## 2016-04-29 NOTE — Progress Notes (Signed)
Initial Nutrition Assessment  DOCUMENTATION CODES:   Non-severe (moderate) malnutrition in context of acute illness/injury  INTERVENTION:  Provide Ensure Enlive po BID, each supplement provides 350 kcal and 20 grams of protein Provide Pro-stat PO BID, each dose provides 100 kcal and 15 grams of protein   NUTRITION DIAGNOSIS:   Malnutrition related to poor appetite, acute illness as evidenced by moderate depletions of muscle mass, moderate depletion of body fat, percent weight loss.   GOAL:   Patient will meet greater than or equal to 90% of their needs   MONITOR:   PO intake, Supplement acceptance, Labs, I & O's, Skin  REASON FOR ASSESSMENT:   Malnutrition Screening Tool    ASSESSMENT:   82 y.o. male found to have bacteremia with fatigue, new dyspnea on exertion, new murmur. PMH is significant for HTN, CAD s/p CABG 1986 with redo 1993, PTCA 2008, and DES 2010, hx MI, LBBB, HFrEF (but recent ECHO 08/2014 with normal EF 55-60%, G1DD), Asthma/COPD, GERD, OA, Osteoporosis, Dyslipidemia, Peripheral Neuropathy, BPH with history or urinary retention, Pacemaker for tachy brady syndrome (03/2015), History of atrial flutter (no anticoagulation due to falls and anemia) . CT scan A/P to look for occult abscess. Showed findings worrisome for biliary obstruction secondary to a mass at the distal common bile duct.  Pt states that he has had a poor appetite for the past 2-3 weeks and he has been eating less. He reports that he has lost 8-9 lbs in the past few weeks. Per weight history, pt has lost 10 lbs. Per nursing notes, he is eating 65-100% of his meals. He has moderate muscle wasting and moderate fat wasting per nutrition-focused physical exam. Pt states that he is having surgery on his gallbladder tomorrow.  RD emphasized the importance of nutrition. Encouraged Ensure to promote weight stabilization and to ensure adequate protein intake. Pt states he has been receiving Ensure and is agreeable  to continuing supplementation.   Labs: low albumin, high alk phos, elevated bilirubin  Diet Order:  Diet Heart Room service appropriate? Yes; Fluid consistency: Thin  Skin:  Reviewed, no issues  Last BM:  8/28  Height:   Ht Readings from Last 1 Encounters:  04/28/16 5' 10"  (1.778 m)    Weight:   Wt Readings from Last 1 Encounters:  04/29/16 151 lb 3.2 oz (68.6 kg)    Ideal Body Weight:  75.45 kg  BMI:  Body mass index is 21.69 kg/m.  Estimated Nutritional Needs:   Kcal:  1650-1900  Protein:  95-110 grams  Fluid:  1.65-1.9 L/day  EDUCATION NEEDS:   No education needs identified at this time  Scarlette Ar RD, LDN, CSP Inpatient Clinical Dietitian Pager: [phone] After Hours Pager: [phone]

## 2016-04-29 NOTE — Discharge Summary (Signed)
[facility] Discharge Summary  Patient name: [name] Medical record number: [id number] Date of birth: Apr 14, 1925 Age: 82 y.o. Gender: male Date of Admission: 04/27/2016  Date of Discharge: 05/02/16  Admitting Physician: Lupita Dawn, MD  Primary Care Provider: Bufford Lope, DO Consultants: GI, cardiology, oncology, infectious disease, interventional radiology  Indication for Hospitalization: bacteremia, fatigue, new DOE  Discharge Diagnoses/Problem List:  Patient Active Problem List   Diagnosis Date Noted  . Cholangitis   . Peri-ampullary neoplasm   . Malnutrition of moderate degree 04/29/2016  . Bile duct obstruction   . Elevated liver function tests   . Bacteremia due to Gram-negative bacteria ([place]) 04/27/2016  . BPH (benign prostatic hyperplasia)   . Asthma, mild intermittent   . Headache 04/05/2016  . Sore throat 03/22/2016  . Insomnia 02/18/2016  . DOE (dyspnea on exertion)   . Pacemaker 12/25/2015  . Atrial flutter, RVR 04/10/2015  . Unstable angina ([place]) 04/10/2015  . Coronary artery disease involving native coronary artery of native heart without angina pectoris 04/10/2015  . Urinary retention due to benign prostatic hyperplasia   . Systolic CHF ([place]) [id number]  . Depression 09/26/2013  . Intertrochanteric fracture of right hip ([place]) 08/29/2013  . Hypertension   . GERD (gastroesophageal reflux disease)   . COPD (chronic obstructive pulmonary disease) ([place])   . H/O cardiac arrest   . Junctional bradycardia 07/25/2012  . Glucose intolerance (impaired glucose tolerance) 07/19/2012  . Normocytic anemia 07/19/2012  . Acute MI, anterolateral wall ([place]) 07/16/2012  . Prostatitis 07/16/2012  . E. coli UTI (urinary tract infection) 07/16/2012  . Elevated transaminase level 07/16/2012  . Foot drop, right 07/28/2011  . LBBB (left bundle branch block) 03/29/2011  . Hx of CABG and re-do CABG  03/29/2011  . Ischemic heart disease   .  Myocardial infarction ([place])   . Acute systolic congestive heart failure, NYHA class 2-EF 40-45%   . Chronic respiratory failure ([place])   . Chronic asthma   . Peripheral neuropathy ([place])   . Dyslipidemia (high LDL; low HDL)   . Essential hypertension 04/04/2007  . PSORIASIS 04/04/2007  . OSTEOARTHRITIS 04/04/2007  . OSTEOPOROSIS 04/04/2007  . Coronary artery disease due to lipid rich plaque 01/05/2007  . HERNIA 01/05/2007   Disposition: home with home health services [facility])  Discharge Condition: Stable, improved.  Discharge Exam:  Gen: pleasant elderly male, NAD Cardiac: RRR, 2/6 systolic murmur, no heaves/thrills Resp: CTAB, normal effort on RA Abd: soft, +bs, no tenderness Ext: no edema noted  Brief Hospital Course:  [name] is a 82 y.o. male found to have bacteriemia with fatigue, DOE, new murmur. PMH is significant for HTN, CAD s/p CABG 1986 with redo 1993, PTCA 2008, and DES 2010, hx MI, LBBB, HFrEF (but recent ECHO 08/2014 with normal EF 55-60%, G1DD), Asthma/COPD, GERD, OA, Osteoporosis, Dyslipidemia, Peripheral Neuropathy, BPH with history or urinary retention, Pacemaker for tachy brady syndrome (03/2015), History of atrial flutter (no anticoagulation due to falls and anemia).   Bacteremia Patient presented from cardiology office by direct admission. Found with new DOE and  New murmur. Blood cultures collected at the cardiology office were followed and found to grow Klebsiella pneumonia. He was given IV CTX and echo was performed.  TEE showed no vegetations.  ID consultation was obtained, they recommended a total of 14 days of antibiotic treatment for Klebsiella bacteremia. Patient stayed on IV CTX while in the hospital. Per ID recommendations, transitioned to PO Bactrim at  discharge to take twice a day for 8 days until 05/10/16.   Common Bile Duct Mass CT A/P was performed to look for occult abscess/source of bacteremia.  Showed findings worrisome for biliary obstruction  secondary to a mass at the CBD. GI was consulted and planned to perform an ERCP which was done on 9/1/ and showed suspected neoplastic periampullary mass. Biopsies were obtained and sent for pathology. CA 19-9 was elevated to 91, which is concerning for pancreatic cancer. GI recommended after the ERCP that IR place a biliary drain which was done on 9/2. Oncology was consulted during his stay and patient is to follow up as an outpatient with [name]. They also recommended he see [name] to evaluate for possible resection. Dr. Marlowe Aschoff office will contact patient as outpatient to schedule follow up.  [name] remained afebrile and was clinically improved and therefore discharged to home on 05/02/16 with referral to [facility] for home health care.   Issues for Follow Up:  1. Prednisone taper: 20mg  x3 days (end 05/03/16), 10mg  x 3 days (9/5-7), then discontinue oral steroids. 2. Continue antibiotics for a total of 14 days, stop date 05/10/16, patient discharged on PO Bactrim double strength tablets to take 1 tablet twice a day. 3. Needs BMP in week after discharge as pts with biliary drain can become hypokalemic and hyponatremic. Encouraged PO hydration. 4. Holding home lasix in the setting of possible hypokalemia 2/2 biliary drainage. 5. Discontinued plavix. No indication that could be found for necessity of plavix at this point. DES >1 year ago. 6. Holding home lipitor in the setting of elevated LFTs. Can restart once CMP shows some resolution of LFTs.  7. Added trazadone for sleep, monitor sleep habits and sedation. 8. Ensure patient f/up with oncology ([name]) and surgery ([name], appt 05/05/16) as an outpatient. 9. Anemia in hospital, recommend checking Hgb as outpatient  Significant Procedures: ERCP, Biliary drain placement  Significant Labs and Imaging:   Recent Labs Lab 04/30/16 0242 05/01/16 0221 05/02/16 0158  WBC 8.1 7.1 6.8  HGB 9.9* 9.1* 9.1*  HCT 30.9* 28.7*  28.6*  PLT 256 246 263    Recent Labs Lab 04/27/16 1840 04/28/16 0258 04/28/16 1014 04/29/16 0158 04/29/16 0802 04/30/16 0242 05/01/16 0221 05/02/16 0158  NA 134* 137  --  134*  --  135 137 136  K 3.7 3.7  --  3.6  --  3.8 3.7 4.0  CL 101 104  --  101  --  106 105 106  CO2 26 27  --  26  --  25 24 25   GLUCOSE 113* 103*  --  102*  --  105* 79 104*  BUN 15 15  --  18  --  21* 20 19  CREATININE 0.86 0.80  --  0.76  --  0.76 0.76 0.82  CALCIUM 9.5 9.4  --  9.4  --  9.4 9.3 9.2  ALKPHOS 643*  --  655*  --  783*  --  713* 678*  AST 148*  --  153*  --  201*  --  193* 191*  ALT 110*  --  109*  --  129*  --  128* 142*  ALBUMIN 2.3*  --  2.3*  --  2.3*  --  2.0* 2.1*    Results/Tests Pending at Time of Discharge: surgical pathology from 04/30/16  Discharge Medications:    Medication List    STOP taking these medications   acetaminophen 500  MG tablet Commonly known as:  TYLENOL   atorvastatin 40 MG tablet Commonly known as:  LIPITOR   clopidogrel 75 MG tablet Commonly known as:  PLAVIX   furosemide 40 MG tablet Commonly known as:  LASIX     TAKE these medications   albuterol 108 (90 Base) MCG/ACT inhaler Commonly known as:  PROVENTIL HFA;VENTOLIN HFA Inhale 2 puffs into the lungs every 6 (six) hours as needed for wheezing or shortness of breath.   aspirin 81 MG EC tablet Take 1 tablet (81 mg total) by mouth daily.   budesonide-formoterol 80-4.5 MCG/ACT inhaler Commonly known as:  SYMBICORT Inhale 2 puffs into the lungs 2 (two) times daily.   CALTRATE GUMMY BITES 250-400 MG-UNIT Chew Generic drug:  Ca Phosphate-Cholecalciferol Chew 2 tablets by mouth 2 (two) times daily.   carbamazepine 100 MG chewable tablet Commonly known as:  TEGRETOL Chew 300 mg by mouth 2 (two) times daily.   docusate sodium 100 MG capsule Commonly known as:  COLACE Take 100 mg by mouth every evening.   feeding supplement (ENSURE ENLIVE) Liqd Take 237 mLs by mouth 2 (two) times daily  between meals.   feeding supplement (PRO-STAT SUGAR FREE 64) Liqd Take 30 mLs by mouth 2 (two) times daily.   finasteride 5 MG tablet Commonly known as:  PROSCAR Take 5 mg by mouth every morning.   ipratropium 0.02 % nebulizer solution Commonly known as:  ATROVENT Take 2.5 mLs (0.5 mg total) by nebulization 2 (two) times daily as needed for wheezing or shortness of breath.   ipratropium 17 MCG/ACT inhaler Commonly known as:  ATROVENT HFA Inhale 2 puffs into the lungs 2 (two) times daily as needed for wheezing.   isosorbide mononitrate 30 MG 24 hr tablet Commonly known as:  IMDUR Take 1 tablet (30 mg total) by mouth daily. What changed:  how much to take   Melatonin 3 MG Tabs Take 3 mg by mouth at bedtime.   metoprolol succinate 12.5 mg Tb24 24 hr tablet Commonly known as:  TOPROL-XL Take 12.5 mg by mouth daily.   multivitamin with minerals tablet Take 1 tablet by mouth every evening.   nitroGLYCERIN 0.4 MG SL tablet Commonly known as:  NITROSTAT Place 1 tablet (0.4 mg total) under the tongue every 5 (five) minutes as needed for chest pain (As needed for chest pain). What changed:  See the new instructions.   potassium chloride SA 20 MEQ tablet Commonly known as:  K-DUR,KLOR-CON Take 20 mEq by mouth daily.   predniSONE 10 MG (21) Tbpk tablet Commonly known as:  STERAPRED UNI-PAK 21 TAB Take 1 tablet (10 mg total) by mouth 3 (three) times daily. Take 20 mg on evening of 9/3 10 mg 3 times per day 9/4-9/6 5 mg 3 times per day 9/7-9/9 What changed:  how much to take  how to take this  when to take this  additional instructions  Another medication with the same name was removed. Continue taking this medication, and follow the directions you see here.   ranitidine 150 MG tablet Commonly known as:  ZANTAC TAKE 1 TABLET BY MOUTH 2 TIMES DAILY. What changed:  See the new instructions.   senna 8.6 MG tablet Commonly known as:  SENOKOT Take 1 tablet by mouth  daily.   sertraline 100 MG tablet Commonly known as:  ZOLOFT Take 150 mg by mouth every morning.   sulfamethoxazole-trimethoprim 800-160 MG tablet Commonly known as:  BACTRIM DS,SEPTRA DS Take 1 tablet by mouth 2 (  two) times daily.   tamsulosin 0.4 MG Caps capsule Commonly known as:  FLOMAX Take 1 capsule (0.4 mg total) by mouth daily.   traZODone 50 MG tablet Commonly known as:  DESYREL Take 1 tablet (50 mg total) by mouth at bedtime.       Discharge Instructions: Please refer to Patient Instructions section of EMR for full details.  Patient was counseled important signs and symptoms that should prompt return to medical care, changes in medications, dietary instructions, activity restrictions, and follow up appointments.   Follow-Up Appointments: Follow-up Information    Bufford Lope, DO.   Contact information: [place] [address] [phone]        [place] .   Why:  home health nurse and physical therapist Contact information: [address] [place] [address] [place], DO 05/02/2016, 10:35 PM PGY-1, [place]

## 2016-04-29 NOTE — Progress Notes (Signed)
Patient ID: [name], male   DOB: September 15, 1924, 82 y.o.   MRN: [id number]          [facility] for Infectious Disease    Date of Admission:  04/27/2016   Day 3 cefepime  His outpatient blood culture has grown Klebsiella sensitive to all drugs tested other than ampicillin. I will narrow therapy to IV ceftriaxone. I would plan on 14 days of total therapy through 05/10/2016. If he is discharged prior to that time I will change him over to trimethoprim sulfamethoxazole one double strength tablet by mouth twice daily to complete his therapy. His bacteremia is probably related to the biliary obstruction noted on yesterday's CT scan. There is no evidence of abscess. I will sign off now but please call if we can be of further assistance while he is here.         Michel Bickers, MD [facility] [phone] pager   [phone] cell 09/02/2015, 1:32 PM

## 2016-04-29 NOTE — Progress Notes (Signed)
SUBJECTIVE: The patient is doing well today.  At this time, he denies chest pain, shortness of breath, or any new concerns.  [name] acetaminophen  1,000 mg Oral QHS  . aspirin EC  81 mg Oral q1800  . carbamazepine  300 mg Oral BID  . ceFEPime (MAXIPIME) IV  2 g Intravenous Q12H  . clopidogrel  75 mg Oral q1800  . enoxaparin (LOVENOX) injection  40 mg Subcutaneous Q24H  . famotidine  20 mg Oral Daily  . feeding supplement (ENSURE ENLIVE)  237 mL Oral BID BM  . finasteride  5 mg Oral Daily  . metoprolol tartrate  12.5 mg Oral BID  . mometasone-formoterol  2 puff Inhalation BID  . predniSONE  40 mg Oral Q breakfast  . sertraline  150 mg Oral Daily  . tamsulosin  0.4 mg Oral QPC supper      OBJECTIVE: Physical Exam: Vitals:   04/28/16 1606 04/28/16 2026 04/28/16 2044 04/29/16 0610  BP: (!) 160/68 (!) 165/77  114/60  Pulse: 64 69  62  Resp: 19 18  18   Temp: 98 F (36.7 C) 97.9 F (36.6 C)  98 F (36.7 C)  TempSrc: Oral Oral  Oral  SpO2: 95% 97% 96% 98%  Weight:    151 lb 3.2 oz (68.6 kg)  Height:        Intake/Output Summary (Last 24 hours) at 04/29/16 [id number] Last data filed at 04/29/16 0831  Gross per 24 hour  Intake             1230 ml  Output             1100 ml  Net              130 ml    Telemetry reveals Sinus, paced rhythm  GEN- The patient is well appearing, alert and oriented x 3 today.   Head- normocephalic, atraumatic Eyes-  Sclera clear, conjunctiva pink Ears- hearing intact Oropharynx- clear Neck- supple, no JVP Lungs- Clear to ausculation bilaterally, normal work of breathing Heart- Regular rate and rhythm, no significant murmurs, no rubs or gallops GI- soft, NT, ND Extremities- no clubbing, cyanosis, or edema Skin- no rash or lesion Psych- euthymic mood, full affect Neuro- no gross deficits appreciated  LABS: Basic Metabolic Panel:  Recent Labs  04/28/16 0258 04/29/16 0158  NA 137 134*  K 3.7 3.6  CL 104 101  CO2 27 26  GLUCOSE 103*  102*  BUN 15 18  CREATININE 0.80 0.76  CALCIUM 9.4 9.4   Liver Function Tests:  Recent Labs  04/27/16 1840 04/28/16 1014  AST 148* 153*  ALT 110* 109*  ALKPHOS 643* 655*  BILITOT 1.3* 2.2*  PROT 6.7 6.8  ALBUMIN 2.3* 2.3*   CBC:  Recent Labs  04/27/16 1747  04/28/16 0258 04/29/16 0158  WBC 9.0  < > 7.5 8.5  NEUTROABS 7.4  --   --   --   HGB 9.4*  < > 9.0* 7.8*  HCT 29.5*  < > 28.5* 24.5*  MCV 92.8  < > 92.2 91.4  PLT 289  < > 261 268  < > = values in this interval not displayed. Cardiac Enzymes:  Recent Labs  04/28/16 1505 04/28/16 2219 04/29/16 0158  TROPONINI 0.04* 0.05* 0.05*     ASSESSMENT AND PLAN:   1.  Gram negative bacteremia  ID input appreciated TEE without evidence of vegetation  Given no vegetation on pacemaker leads, Aris Moman not plan extraction, repeat  BC pending, and Shamel Galyean follow from [place] pending further ID recommendations  2.  SSS Device check yesterday with normal function  3.  CAD s/p CABG No recent ischemic symptoms Continue medical therapy   4.  HTN Stable No change required today  5.  Atrial flutter No AF, AMS episodes noted, 0% burden  Not felt to be a candidate for [facility] with falls and prior syncope   6. Abnormal Abd CT, LFT's Statin held  Defer management w/u to IM service     EP service remains available, please recall if needed.  Tommye Standard, PA-C 04/29/2016 8:38 AM   I have seen and examined this patient with Tommye Standard.  Agree with above, note added to reflect my findings.  On exam, regular rhythm, no murmurs, lungs clear. Presented with fevers and found to have gram-negative bacteremia currently on cefepime. TEE shows no vegetations on the pacemaker leads. We'll continue to monitor. Please call EP if further concerns.    Kaemon Barnett M. Andrea Ferrer MD 04/29/2016 9:07 AM

## 2016-04-29 NOTE — Consult Note (Signed)
[facility] Consult: 12:20 PM 04/29/2016  LOS: 2 days    Referring Provider: Dr Ree Kida.    Primary Care Physician:  Bufford Lope, DO Primary Gastroenterologist:  Dr Sharlett Iles     Reason for Consultation:  Cholangitis and obstructing CBD mass.    HPI: [name] is a 82 y.o. male.  hx CAD, CABG 1986 and 1993, PTCA 2008, DES 2010.  STEMI 06/2012.  CHF.  A flutter.  On DAP with Plavix, ASA.  S/p pacemaker for tachy/brady.   COPD asthma.  Depression.  Ureteral stones and stent placement 2014.  CSF leak after discectomy.  Hx of hepatitis in 1970s, sounds like Hep A.  No liver disease.  S/p TURP for prostate cancer, remotely.   Dental work 5 weeks ago. Treated for strep throat 03/22/16.  S/p temporal artery biopsy last week, ruled out for giant cell arteritis but treated empirically with steroids until biopsy resulted.  Fevers intermittently started ~ 4 weeks ago.  Temp as high as 101.8 last week, with rigors and chills.  Also dimished appetite, infrequent nausea, no vomiting, 8# weight loss. TEE 8/30 to rule out endocarditis was negative for thrombus/vegetation, EF 55 to 60%.  Dr Stanford Breed got blood clxs on 8/29 and these grew Klebsiella.   CT scan 8/30 shows marked intra and extrahepatic biliary dilatation with soft tissue filling defect at distal CBD; PD also very dilated. GB distended to due biliary obstruction.  LFTs are elevated.  Especially the alk phos.   Incidentally has normocytic anemia. Last dose of Plavix, 81 ASA was 8/30    Past Medical History:  Diagnosis Date  . Anemia 2014   was on iron pills and then was able to come off  . Arthritis    "all over"  . Asthma       . Atrial flutter ([place])    a. diagnosed 03/2015  . Bacteremia 03/2016  . Complication of anesthesia    B/P dropped low and had to  stay in recovery longer  . Coronary artery disease    a. s/p CABG 1986 b. redo CABG 1993 c. PTCA 2008 d. DES 2010  . Depression       . Diverticulosis   . GERD (gastroesophageal reflux disease)       . Hepatitis    hepatitis- long time ago, occupational contamination   . Hiatal hernia   . Hyperlipemia   . Hypertension       . Insomnia       . Kidney stones   . Myocardial infarction ([place])   . On home oxygen therapy    "2L; just at night" (01/31/2015)  . Peripheral neuropathy ([place])   . Peripheral vascular disease ([facility])    thrombus- in leg- many yrs. ago- ?R- coumadin x1 mth  . Pneumonia 1960's  . Presence of permanent cardiac pacemaker   . Prostate cancer [facility])    radiation therapy- 2002  . Systolic CHF ([place])       . VT (ventricular tachycardia) ([place]) 06/2012   a. in the setting  of STEMI b. on amio during hospitalization    Past Surgical History:  Procedure Laterality Date  . APPENDECTOMY  1943  . CATARACT EXTRACTION W/ INTRAOCULAR LENS  IMPLANT, BILATERAL    . COLONOSCOPY    . CORONARY ARTERY BYPASS GRAFT  1986;  1993   CABG X 5; CABG X 1  . CYSTOSCOPY W/ URETERAL STENT PLACEMENT Right 02/28/2013   Procedure: CYSTOSCOPY WITH RETROGRADE PYELOGRAM/URETERAL STENT PLACEMENT;  Surgeon: Dutch Gray, MD;  Location: [facility];  Service: Urology;  Laterality: Right;  . CYSTOSCOPY WITH URETEROSCOPY AND STENT PLACEMENT Right 03/23/2013   Procedure: RIGHT URETEROSCOPY, LASER LITHO AND STENT PLACEMENT;  Surgeon: Fredricka Bonine, MD;  Location: [facility];  Service: Urology;  Laterality: Right;  . EP IMPLANTABLE DEVICE N/A 04/11/2015   Procedure: Pacemaker Implant;  Surgeon: Will Meredith Leeds, MD;  Location: [place] CV LAB;  Service: Cardiovascular;  Laterality: N/A;  . FOOT FUSION Left 2000   heel  . FRACTURE SURGERY     heel- crushed -2002,  (hardware)   . HAMMER TOE SURGERY Right    little toe  . HARDWARE REMOVAL Right 2002   great toe; Dr. Sharol Given  . HOLMIUM LASER APPLICATION  Right 02/20/7627   Procedure: HOLMIUM LASER APPLICATION;  Surgeon: Fredricka Bonine, MD;  Location: [facility];  Service: Urology;  Laterality: Right;  . INGUINAL HERNIA REPAIR Left 01/31/2015   Procedure: OPEN REPAIR RECURRENT LEFT INGUINAL HERNIA;  Surgeon: Fanny Skates, MD;  Location: [place];  Service: General;  Laterality: Left;  . INSERTION OF MESH Left 01/31/2015   Procedure: INSERTION OF MESH;  Surgeon: Fanny Skates, MD;  Location: [place];  Service: General;  Laterality: Left;  . INTRAMEDULLARY (IM) NAIL INTERTROCHANTERIC Right 08/29/2013   Procedure: INTRAMEDULLARY (IM) NAIL INTERTROCHANTRIC;  Surgeon: Marianna Payment, MD;  Location: [place];  Service: Orthopedics;  Laterality: Right;  . JOINT REPLACEMENT     R great toe  . KNEE ARTHROSCOPY Right X 2  . LEFT AND RIGHT HEART CATHETERIZATION WITH CORONARY ANGIOGRAM N/A 07/15/2012   Procedure: LEFT AND RIGHT HEART CATHETERIZATION WITH CORONARY ANGIOGRAM;  Surgeon: Jettie Booze, MD;  Location: [facility] CATH LAB;  Service: Cardiovascular;  Laterality: N/A;  . LUMBAR LAMINECTOMY/DECOMPRESSION MICRODISCECTOMY  08/10/2011   Procedure: LUMBAR LAMINECTOMY/DECOMPRESSION MICRODISCECTOMY;  Surgeon: Cooper Render Pool;  Location: [place] NEURO ORS;  Service: Neurosurgery;  Laterality: Right;  RIGHT Lumbar five-sacral one LAMINECTOMY, MICRODISCECTOMY  . LUMBAR WOUND DEBRIDEMENT  08/22/2011   Procedure: LUMBAR WOUND DEBRIDEMENT;  Surgeon: Eustace Moore;  Location: [place] NEURO ORS;  Service: Neurosurgery;  Laterality: N/A;  Repair of CSF Leak requiring laminectomy  . PERCUTANEOUS CORONARY STENT INTERVENTION (PCI-S) N/A 07/15/2012   Procedure: PERCUTANEOUS CORONARY STENT INTERVENTION (PCI-S);  Surgeon: Jettie Booze, MD;  Location: [facility] CATH LAB;  Service: Cardiovascular;  Laterality: N/A;  . RHINOPLASTY  [id number]  . TEE WITHOUT CARDIOVERSION N/A 04/28/2016   Procedure: TRANSESOPHAGEAL ECHOCARDIOGRAM (TEE);  Surgeon: Skeet Latch, MD;  Location: [facility] ENDOSCOPY;   Service: Cardiovascular;  Laterality: N/A;  . TONSILLECTOMY  1940  . TRANSURETHRAL RESECTION OF PROSTATE  x2    Prior to Admission medications   Medication Sig Start Date End Date Taking? Authorizing Provider  acetaminophen (TYLENOL) 500 MG tablet Take 1,000 mg by mouth at bedtime.    Yes Historical Provider, MD  albuterol (PROVENTIL HFA;VENTOLIN HFA) 108 (90 Base) MCG/ACT inhaler Inhale 2 puffs into the lungs every 6 (six) hours as needed for wheezing or shortness of breath. 12/30/15  Yes Mercy Riding, MD  aspirin EC 81 MG EC tablet Take 1 tablet (81 mg total) by mouth daily. 04/12/15  Yes Luke K Kilroy, PA-C  atorvastatin (LIPITOR) 40 MG tablet Take 40 mg by mouth daily at 6 PM.   Yes Historical Provider, MD  budesonide-formoterol (SYMBICORT) 80-4.5 MCG/ACT inhaler Inhale 2 puffs into the lungs 2 (two) times daily. 09/12/12  Yes Tanda Rockers, MD  Ca Phosphate-Cholecalciferol (CALTRATE GUMMY BITES) 250-400 MG-UNIT CHEW Chew 2 tablets by mouth 2 (two) times daily.   Yes Historical Provider, MD  carbamazepine (TEGRETOL) 100 MG chewable tablet Chew 300 mg by mouth 2 (two) times daily.   Yes Historical Provider, MD  clopidogrel (PLAVIX) 75 MG tablet Take 1 tablet (75 mg total) by mouth daily. Patient taking differently: Take 75 mg by mouth every evening.  04/12/15  Yes Luke K Kilroy, PA-C  docusate sodium (COLACE) 100 MG capsule Take 100 mg by mouth every evening.    Yes Historical Provider, MD  finasteride (PROSCAR) 5 MG tablet Take 5 mg by mouth every morning.    Yes Historical Provider, MD  furosemide (LASIX) 40 MG tablet Take 40 mg by mouth every morning.   Yes Historical Provider, MD  ipratropium (ATROVENT HFA) 17 MCG/ACT inhaler Inhale 2 puffs into the lungs 2 (two) times daily as needed for wheezing.    Yes Historical Provider, MD  isosorbide mononitrate (IMDUR) 30 MG 24 hr tablet Take 90 mg by mouth daily.   Yes Historical Provider, MD  Melatonin 3 MG TABS Take 3 mg by mouth at bedtime.   Yes  Historical Provider, MD  metoprolol succinate (TOPROL-XL) 12.5 mg TB24 24 hr tablet Take 12.5 mg by mouth daily.   Yes Historical Provider, MD  Multiple Vitamins-Minerals (MULTIVITAMIN WITH MINERALS) tablet Take 1 tablet by mouth every evening.    Yes Historical Provider, MD  NITROSTAT 0.4 MG SL tablet PLACE 1 TABLET UNDER THE TONGUE EVERY 5 MINUTES ASDIRECTED BY PHYSICIAN 01/07/15  Yes Darlin Coco, MD  potassium chloride SA (K-DUR,KLOR-CON) 20 MEQ tablet Take 20 mEq by mouth daily.    Yes Historical Provider, MD  predniSONE (DELTASONE) 20 MG tablet Take 3 tablets (60 mg total) by mouth daily with breakfast. 04/05/16  Yes Archie Patten, MD  ranitidine (ZANTAC) 150 MG tablet TAKE 1 TABLET BY MOUTH 2 TIMES DAILY. Patient taking differently: Take 150 mg by mouth at bedtime 10/29/15  Yes Darlin Coco, MD  senna (SENOKOT) 8.6 MG tablet Take 1 tablet by mouth daily.    Yes Historical Provider, MD  sertraline (ZOLOFT) 100 MG tablet Take 150 mg by mouth every morning.    Yes Historical Provider, MD  tamsulosin (FLOMAX) 0.4 MG CAPS capsule Take 1 capsule (0.4 mg total) by mouth daily. 09/06/14  Yes Chester N Rumley, DO  predniSONE (STERAPRED UNI-PAK 21 TAB) 10 MG (21) TBPK tablet Take as directed. Patient not taking: Reported on 04/27/2016 01/04/16   Janora Norlander, DO    Scheduled Meds: . sodium chloride   Intravenous Once  . acetaminophen  1,000 mg Oral QHS  . aspirin EC  81 mg Oral q1800  . carbamazepine  300 mg Oral BID  . cefTRIAXone (ROCEPHIN)  IV  2 g Intravenous Q24H  . clopidogrel  75 mg Oral q1800  . enoxaparin (LOVENOX) injection  40 mg Subcutaneous Q24H  . famotidine  20 mg Oral Daily  . feeding supplement (ENSURE ENLIVE)  237 mL Oral BID BM  . finasteride  5 mg Oral Daily  . metoprolol tartrate  12.5 mg Oral BID  . mometasone-formoterol  2 puff Inhalation BID  . predniSONE  40 mg Oral Q breakfast  . sertraline  150 mg Oral Daily  . tamsulosin  0.4 mg Oral QPC supper    Infusions:   PRN Meds: albuterol, ipratropium   Allergies as of 04/27/2016  . (No Known Allergies)    Family History  Problem Relation Age of Onset  . Heart attack Mother   . Addison's disease Father   . Heart attack Sister   . Anesthesia problems Neg Hx   . Hypotension Neg Hx   . Malignant hyperthermia Neg Hx   . Pseudochol deficiency Neg Hx     Social History   Social History  . Marital status: Married    Spouse name: N/A  . Number of children: N/A  . Years of education: N/A   Occupational History  . Not on file.   Social History Main Topics  . Smoking status: Never Smoker  . Smokeless tobacco: Never Used  . Alcohol use No  . Drug use: No  . Sexual activity: Not on file   Other Topics Concern  . Not on file   Social History Narrative  . No narrative on file    REVIEW OF SYSTEMS: Constitutional:  some weakness, a few falls in the last month ENT:  No nose bleeds Pulm:  Flare of dyspnea  CV:  No palpitations, no LE edema.  GU:  + nocturia, urine is orange.   GI:  Per HPI Heme:  No unusual bleeding.  Bruises easily   Transfusions:  In 08/2013 after hip fracture and surgery  Neuro:  No headaches, no peripheral tingling or numbness Some insomnia.   Derm:  No itching, no rash or sores.  Endocrine:  No sweats or chills.  No polyuria or dysuria Immunization:  reviewed Travel:  None beyond local counties in last few months.    PHYSICAL EXAM: Vital signs in last 24 hours: Vitals:   04/29/16 0610 04/29/16 1208  BP: 114/60 (!) 149/70  Pulse: 62 63  Resp: 18 18  Temp: 98 F (36.7 C) 97.3 F (36.3 C)   Wt Readings from Last 3 Encounters:  04/29/16 68.6 kg (151 lb 3.2 oz)  04/26/16 72.1 kg (159 lb)  04/05/16 73 kg (161 lb)    General: somewhat frail, comfortable, alert Head:  No asymmetry or trauma  Eyes:  No icterus or pallor Ears:  Slightly HOH  Nose:  No congestion or discharge Mouth:  Clear, moist, tongue midline, throat benign Neck:  No  mass or JVD Lungs:  Clear bil.  No cough or SOB Heart: RRR.  No mrg.  S1/s2 present.  Pacemaker on upper left chest Abdomen:  Soft, NT, ND.  No mass or HSM.  No bruits. BS hypoactive. 30 mm bruise on abdomen  Rectal: deferred   Musc/Skeltl: no joint swelling, redness.  sme arthritic changes in hands.  Kyphosis.  Extremities:  No CCE  Neurologic:  Oriented x 3.  No limb weakness or tremor.  Talkative, provides lots of accurate details Skin:  No obvious jaundice Tattoos:  none   Psych:  Pleasant, calm.    Intake/Output from previous day: 08/30 0701 - 08/31 0700 In: 990 [P.O.:840; IV Piggyback:150] Out: 850 [Urine:850] Intake/Output this shift: Total I/O In: 240 [P.O.:240] Out: 350 [Urine:350]  LAB RESULTS:  Recent Labs  04/27/16 1840 04/28/16 0258 04/29/16 0158  WBC 8.9 7.5 8.5  HGB 9.0* 9.0* 7.8*  HCT 28.2* 28.5* 24.5*  PLT 280 261 268   BMET Lab Results  Component Value Date   NA 134 (L) 04/29/2016   NA 137 04/28/2016   NA 134 (L) 04/27/2016   K 3.6 04/29/2016   K 3.7 04/28/2016   K 3.7 04/27/2016   CL 101 04/29/2016   CL 104 04/28/2016   CL 101 04/27/2016   CO2 26 04/29/2016   CO2 27 04/28/2016   CO2 26 04/27/2016   GLUCOSE 102 (H) 04/29/2016   GLUCOSE 103 (H) 04/28/2016   GLUCOSE 113 (H) 04/27/2016   BUN 18 04/29/2016   BUN 15 04/28/2016   BUN 15 04/27/2016   CREATININE 0.76 04/29/2016   CREATININE 0.80 04/28/2016   CREATININE 0.86 04/27/2016   CALCIUM 9.4 04/29/2016   CALCIUM 9.4 04/28/2016   CALCIUM 9.5 04/27/2016   LFT  Recent Labs  04/27/16 1840 04/28/16 1014 04/29/16 0802  PROT 6.7 6.8 6.9  ALBUMIN 2.3* 2.3* 2.3*  AST 148* 153* 201*  ALT 110* 109* 129*  ALKPHOS 643* 655* 783*  BILITOT 1.3* 2.2* 2.5*  BILIDIR  --  1.5* 1.7*  IBILI  --  0.7 0.8   PT/INR Lab Results  Component Value Date   INR 1.16 04/11/2015   INR 1.16 01/23/2015   INR 1.09 08/29/2013   Hepatitis Panel No results for input(s): HEPBSAG, HCVAB, HEPAIGM, HEPBIGM  in the last 72 hours. C-Diff No components found for: CDIFF Lipase  No results found for: LIPASE  Drugs of Abuse  No results found for: LABOPIA, COCAINSCRNUR, LABBENZ, AMPHETMU, THCU, LABBARB   RADIOLOGY STUDIES: Dg Chest 2 View  Result Date: 04/27/2016 CLINICAL DATA:  Short of breath.  Hypertension.  History of asthma. 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 The cardiac silhouette is top-normal in size. No mediastinal or hilar masses or evidence of adenopathy. Mild linear scarring at the lung bases. Lungs otherwise clear. No pleural effusion or pneumothorax. Left anterior chest wall sequential pacemaker is stable and well positioned. Bony thorax is demineralized but grossly intact. IMPRESSION: No acute cardiopulmonary disease. Stable appearance from the prior study. Electronically Signed   By: Lajean Manes M.D.   On: 04/27/2016 15:31   Ct Abdomen Pelvis W Contrast  Result Date: 04/28/2016 CLINICAL DATA:  Epigastric pain EXAM: CT ABDOMEN AND PELVIS WITH CONTRAST TECHNIQUE: Multidetector CT imaging of the abdomen and pelvis was performed using the standard protocol following bolus administration of intravenous contrast. CONTRAST:  148m ISOVUE-300 IOPAMIDOL (ISOVUE-300) INJECTION 61%, 321mISOVUE-300 IOPAMIDOL (ISOVUE-300) INJECTION 61% COMPARISON:  02/28/2013 FINDINGS: Dependent atelectasis at the lung bases. Marked intra and extrahepatic biliary dilatation is present. There is a soft tissue filling defect in the distal common bile duct. Cholangiocarcinoma is not excluded. Pancreatic ducts are also very dilated. Tiny hypodensity in the right lobe of liver on image 30 is stable There is gallbladder wall thickening. The gallbladder is mildly distended. This is likely related to biliary obstruction. Spleen is unremarkable Adrenal glands are within normal limits Several benign appearing renal cysts are stable. Sigmoid diverticulosis. No evidence of  acute diverticulitis. No obvious mass in the colon. Prominent stool burden within the colon. No evidence of small-bowel obstruction Bladder is unremarkable.  Postoperative changes in the prostate. Atherosclerotic and iliac artery atherosclerotic calcifications. No abnormal retroperitoneal adenopathy Anterolisthesis L5 upon S1. Postoperative changes at this level. These findings are stable. No vertebral compression deformity. IMPRESSION: Findings above are worrisome for  biliary obstruction secondary to a mass at the distal common bile duct. Correlation with bilirubin levels is recommended. Gallbladder wall thickening is likely related to biliary obstruction. Electronically Signed   By: Marybelle Killings M.D.   On: 04/28/2016 22:15    ENDOSCOPIC STUDIES: Remote screening colonoscopies.    IMPRESSION:   *  Obstructing CBD mass with cholangitis, klebsiella bacteremia.  On Rocephin.    *  Normocytic anemia. FOBT in 01/2016 negative x 3.      PLAN:     *  Ercp with stent placement and bile duct brushing set for 1330 tomorrow.  Hold plavix, continue abx.  Risks of bleeding, perforation, inability to place stent d/w pt.   Case d/w Dr Ardis Hughs.  *  CA 19-9,    Azucena Freed  04/29/2016, 12:20 PM Pager: [phone]

## 2016-04-30 ENCOUNTER — Encounter (HOSPITAL_COMMUNITY): Payer: Self-pay | Admitting: *Deleted

## 2016-04-30 ENCOUNTER — Inpatient Hospital Stay (HOSPITAL_COMMUNITY): Payer: Medicare Other | Admitting: Anesthesiology

## 2016-04-30 ENCOUNTER — Inpatient Hospital Stay (HOSPITAL_COMMUNITY): Payer: Medicare Other

## 2016-04-30 ENCOUNTER — Encounter (HOSPITAL_COMMUNITY)
Admission: AD | Disposition: A | Discharge status: Home Health | Payer: Self-pay | Source: Ambulatory Visit | Attending: Family Medicine

## 2016-04-30 DIAGNOSIS — D490 Neoplasm of unspecified behavior of digestive system: Secondary | ICD-10-CM

## 2016-04-30 DIAGNOSIS — E440 Moderate protein-calorie malnutrition: Secondary | ICD-10-CM

## 2016-04-30 DIAGNOSIS — D379 Neoplasm of uncertain behavior of digestive organ, unspecified: Secondary | ICD-10-CM

## 2016-04-30 HISTORY — PX: ERCP: SHX5425

## 2016-04-30 LAB — TROPONIN I
TROPONIN I: 0.04 ng/mL — AB (ref <0.03)
TROPONIN I: 0.04 ng/mL — AB (ref <0.03)
TROPONIN I: 0.04 ng/mL — AB (ref <0.03)
Troponin I: 0.04 ng/mL (ref <0.03)

## 2016-04-30 LAB — CBC
HCT: 30.9 % — ABNORMAL LOW (ref 39.0–52.0)
Hemoglobin: 9.9 g/dL — ABNORMAL LOW (ref 13.0–17.0)
MCH: 29.5 pg (ref 26.0–34.0)
MCHC: 32 g/dL (ref 30.0–36.0)
MCV: 92 fL (ref 78.0–100.0)
PLATELETS: 256 10*3/uL (ref 150–400)
RBC: 3.36 MIL/uL — ABNORMAL LOW (ref 4.22–5.81)
RDW: 14.7 % (ref 11.5–15.5)
WBC: 8.1 10*3/uL (ref 4.0–10.5)

## 2016-04-30 LAB — BASIC METABOLIC PANEL
Anion gap: 4 — ABNORMAL LOW (ref 5–15)
BUN: 21 mg/dL — AB (ref 6–20)
CALCIUM: 9.4 mg/dL (ref 8.9–10.3)
CHLORIDE: 106 mmol/L (ref 101–111)
CO2: 25 mmol/L (ref 22–32)
CREATININE: 0.76 mg/dL (ref 0.61–1.24)
Glucose, Bld: 105 mg/dL — ABNORMAL HIGH (ref 65–99)
Potassium: 3.8 mmol/L (ref 3.5–5.1)
SODIUM: 135 mmol/L (ref 135–145)

## 2016-04-30 LAB — TYPE AND SCREEN
ABO/RH(D): AB POS
Antibody Screen: NEGATIVE
Unit division: 0

## 2016-04-30 SURGERY — ERCP, WITH INTERVENTION IF INDICATED
Anesthesia: General

## 2016-04-30 MED ORDER — ONDANSETRON HCL 4 MG/2ML IJ SOLN: 4.0000 mg | Freq: Once | INTRAMUSCULAR | Status: DC | PRN

## 2016-04-30 MED ORDER — FENTANYL CITRATE (PF) 100 MCG/2ML IJ SOLN
INTRAMUSCULAR | Status: DC | PRN
  Administered 2016-04-30: 50 ug via INTRAVENOUS

## 2016-04-30 MED ORDER — ROCURONIUM BROMIDE 10 MG/ML (PF) SYRINGE
PREFILLED_SYRINGE | INTRAVENOUS | Status: DC | PRN
  Administered 2016-04-30: 30 mg via INTRAVENOUS

## 2016-04-30 MED ORDER — ENOXAPARIN SODIUM 40 MG/0.4ML ~~LOC~~ SOLN: 40.0000 mg | SUBCUTANEOUS | Status: DC

## 2016-04-30 MED ORDER — FENTANYL CITRATE (PF) 100 MCG/2ML IJ SOLN: 25.0000 ug | INTRAMUSCULAR | Status: DC | PRN

## 2016-04-30 MED ORDER — GLUCAGON HCL RDNA (DIAGNOSTIC) 1 MG IJ SOLR
INTRAMUSCULAR | Status: DC | PRN
  Administered 2016-04-30: 1 mg via INTRAVENOUS

## 2016-04-30 MED ORDER — LACTATED RINGERS IV SOLN
INTRAVENOUS | Status: DC
  Administered 2016-04-30: 13:00:00 via INTRAVENOUS

## 2016-04-30 MED ORDER — POLYETHYLENE GLYCOL 3350 17 G PO PACK
17.0000 g | PACK | Freq: Every day | ORAL | Status: DC
  Filled 2016-04-30 (×2): qty 1

## 2016-04-30 MED ORDER — LIDOCAINE 2% (20 MG/ML) 5 ML SYRINGE
INTRAMUSCULAR | Status: DC | PRN
  Administered 2016-04-30: 60 mg via INTRAVENOUS

## 2016-04-30 MED ORDER — PROPOFOL 10 MG/ML IV BOLUS
INTRAVENOUS | Status: DC | PRN
Start: 2016-04-30 — End: 2016-04-30
  Administered 2016-04-30: 70 mg via INTRAVENOUS

## 2016-04-30 MED ORDER — INDOMETHACIN 50 MG RE SUPP
RECTAL | Status: AC
  Filled 2016-04-30: qty 2

## 2016-04-30 MED ORDER — INDOMETHACIN 50 MG RE SUPP
RECTAL | Status: DC | PRN
  Administered 2016-04-30: 100 mg via RECTAL

## 2016-04-30 MED ORDER — PHENYLEPHRINE HCL 10 MG/ML IJ SOLN
INTRAVENOUS | Status: DC | PRN
  Administered 2016-04-30: 25 ug/min via INTRAVENOUS

## 2016-04-30 MED ORDER — IOPAMIDOL (ISOVUE-300) INJECTION 61%
INTRAVENOUS | Status: AC
  Filled 2016-04-30: qty 50

## 2016-04-30 MED ORDER — SODIUM CHLORIDE 0.9 % IV SOLN
INTRAVENOUS | Status: DC | PRN
  Administered 2016-04-30: 10 mL

## 2016-04-30 MED ORDER — SUGAMMADEX SODIUM 200 MG/2ML IV SOLN
INTRAVENOUS | Status: DC | PRN
  Administered 2016-04-30: 200 mg via INTRAVENOUS

## 2016-04-30 MED ORDER — TRAZODONE HCL 50 MG PO TABS
50.0000 mg | ORAL_TABLET | Freq: Every day | ORAL | Status: DC
  Administered 2016-04-30 – 2016-05-01 (×2): 50 mg via ORAL
  Filled 2016-04-30 (×2): qty 1

## 2016-04-30 NOTE — Anesthesia Procedure Notes (Signed)
Procedure Name: Intubation Date/Time: 04/30/2016 1:41 PM Performed by: Rush Farmer E Pre-anesthesia Checklist: Patient identified, Emergency Drugs available, Suction available and Patient being monitored Patient Re-evaluated:Patient Re-evaluated prior to inductionOxygen Delivery Method: Circle system utilized Preoxygenation: Pre-oxygenation with 100% oxygen Intubation Type: IV induction Ventilation: Mask ventilation without difficulty Laryngoscope Size: Mac and 4 Grade View: Grade I Tube type: Oral Tube size: 7.5 mm Number of attempts: 1 Airway Equipment and Method: Stylet Placement Confirmation: ETT inserted through vocal cords under direct vision,  positive ETCO2 and breath sounds checked- equal and bilateral Secured at: 21 cm Tube secured with: Tape Dental Injury: Teeth and Oropharynx as per pre-operative assessment

## 2016-04-30 NOTE — H&P (View-Only) (Signed)
[facility] Consult: 12:20 PM 04/29/2016  LOS: 2 days    Referring Provider: Dr Ree Kida.    Primary Care Physician:  Bufford Lope, DO Primary Gastroenterologist:  Dr Sharlett Iles     Reason for Consultation:  Cholangitis and obstructing CBD mass.    HPI: [name] is a 82 y.o. male.  hx CAD, CABG 1986 and 1993, PTCA 2008, DES 2010.  STEMI 06/2012.  CHF.  A flutter.  On DAP with Plavix, ASA.  S/p pacemaker for tachy/brady.   COPD asthma.  Depression.  Ureteral stones and stent placement 2014.  CSF leak after discectomy.  Hx of hepatitis in 1970s, sounds like Hep A.  No liver disease.  S/p TURP for prostate cancer, remotely.   Dental work 5 weeks ago. Treated for strep throat 03/22/16.  S/p temporal artery biopsy last week, ruled out for giant cell arteritis but treated empirically with steroids until biopsy resulted.  Fevers intermittently started ~ 4 weeks ago.  Temp as high as 101.8 last week, with rigors and chills.  Also dimished appetite, infrequent nausea, no vomiting, 8# weight loss. TEE 8/30 to rule out endocarditis was negative for thrombus/vegetation, EF 55 to 60%.  Dr Stanford Breed got blood clxs on 8/29 and these grew Klebsiella.   CT scan 8/30 shows marked intra and extrahepatic biliary dilatation with soft tissue filling defect at distal CBD; PD also very dilated. GB distended to due biliary obstruction.  LFTs are elevated.  Especially the alk phos.   Incidentally has normocytic anemia. Last dose of Plavix, 83 ASA was 8/30    Past Medical History:  Diagnosis Date  . Anemia 2014   was on iron pills and then was able to come off  . Arthritis    "all over"  . Asthma       . Atrial flutter ([place])    a. diagnosed 03/2015  . Bacteremia 03/2016  . Complication of anesthesia    B/P dropped low and had to  stay in recovery longer  . Coronary artery disease    a. s/p CABG 1986 b. redo CABG 1993 c. PTCA 2008 d. DES 2010  . Depression       . Diverticulosis   . GERD (gastroesophageal reflux disease)       . Hepatitis    hepatitis- long time ago, occupational contamination   . Hiatal hernia   . Hyperlipemia   . Hypertension       . Insomnia       . Kidney stones   . Myocardial infarction ([place])   . On home oxygen therapy    "2L; just at night" (01/31/2015)  . Peripheral neuropathy ([place])   . Peripheral vascular disease (H[facility])    thrombus- in leg- many yrs. ago- ?R- coumadin x1 mth  . Pneumonia 1960's  . Presence of permanent cardiac pacemaker   . Prostate cancer [facility])    radiation therapy- 2002  . Systolic CHF ([place])       . VT (ventricular tachycardia) ([place]) 06/2012   a. in the setting  of STEMI b. on amio during hospitalization    Past Surgical History:  Procedure Laterality Date  . APPENDECTOMY  1943  . CATARACT EXTRACTION W/ INTRAOCULAR LENS  IMPLANT, BILATERAL    . COLONOSCOPY    . CORONARY ARTERY BYPASS GRAFT  1986;  1993   CABG X 5; CABG X 1  . CYSTOSCOPY W/ URETERAL STENT PLACEMENT Right 02/28/2013   Procedure: CYSTOSCOPY WITH RETROGRADE PYELOGRAM/URETERAL STENT PLACEMENT;  Surgeon: Dutch Gray, MD;  Location: [facility];  Service: Urology;  Laterality: Right;  . CYSTOSCOPY WITH URETEROSCOPY AND STENT PLACEMENT Right 03/23/2013   Procedure: RIGHT URETEROSCOPY, LASER LITHO AND STENT PLACEMENT;  Surgeon: Fredricka Bonine, MD;  Location: [facility];  Service: Urology;  Laterality: Right;  . EP IMPLANTABLE DEVICE N/A 04/11/2015   Procedure: Pacemaker Implant;  Surgeon: Will Meredith Leeds, MD;  Location: [place] CV LAB;  Service: Cardiovascular;  Laterality: N/A;  . FOOT FUSION Left 2000   heel  . FRACTURE SURGERY     heel- crushed -2002,  (hardware)   . HAMMER TOE SURGERY Right    little toe  . HARDWARE REMOVAL Right 2002   great toe; Dr. Sharol Given  . HOLMIUM LASER APPLICATION  Right 2/77/4128   Procedure: HOLMIUM LASER APPLICATION;  Surgeon: Fredricka Bonine, MD;  Location: [facility];  Service: Urology;  Laterality: Right;  . INGUINAL HERNIA REPAIR Left 01/31/2015   Procedure: OPEN REPAIR RECURRENT LEFT INGUINAL HERNIA;  Surgeon: Fanny Skates, MD;  Location: [place];  Service: General;  Laterality: Left;  . INSERTION OF MESH Left 01/31/2015   Procedure: INSERTION OF MESH;  Surgeon: Fanny Skates, MD;  Location: [place];  Service: General;  Laterality: Left;  . INTRAMEDULLARY (IM) NAIL INTERTROCHANTERIC Right 08/29/2013   Procedure: INTRAMEDULLARY (IM) NAIL INTERTROCHANTRIC;  Surgeon: Marianna Payment, MD;  Location: [place];  Service: Orthopedics;  Laterality: Right;  . JOINT REPLACEMENT     R great toe  . KNEE ARTHROSCOPY Right X 2  . LEFT AND RIGHT HEART CATHETERIZATION WITH CORONARY ANGIOGRAM N/A 07/15/2012   Procedure: LEFT AND RIGHT HEART CATHETERIZATION WITH CORONARY ANGIOGRAM;  Surgeon: Jettie Booze, MD;  Location: [facility] CATH LAB;  Service: Cardiovascular;  Laterality: N/A;  . LUMBAR LAMINECTOMY/DECOMPRESSION MICRODISCECTOMY  08/10/2011   Procedure: LUMBAR LAMINECTOMY/DECOMPRESSION MICRODISCECTOMY;  Surgeon: Cooper Render Pool;  Location: [place] NEURO ORS;  Service: Neurosurgery;  Laterality: Right;  RIGHT Lumbar five-sacral one LAMINECTOMY, MICRODISCECTOMY  . LUMBAR WOUND DEBRIDEMENT  08/22/2011   Procedure: LUMBAR WOUND DEBRIDEMENT;  Surgeon: Eustace Moore;  Location: [place] NEURO ORS;  Service: Neurosurgery;  Laterality: N/A;  Repair of CSF Leak requiring laminectomy  . PERCUTANEOUS CORONARY STENT INTERVENTION (PCI-S) N/A 07/15/2012   Procedure: PERCUTANEOUS CORONARY STENT INTERVENTION (PCI-S);  Surgeon: Jettie Booze, MD;  Location: [facility] CATH LAB;  Service: Cardiovascular;  Laterality: N/A;  . RHINOPLASTY  [id number]  . TEE WITHOUT CARDIOVERSION N/A 04/28/2016   Procedure: TRANSESOPHAGEAL ECHOCARDIOGRAM (TEE);  Surgeon: Skeet Latch, MD;  Location: [name] ENDOSCOPY;   Service: Cardiovascular;  Laterality: N/A;  . TONSILLECTOMY  1940  . TRANSURETHRAL RESECTION OF PROSTATE  x2    Prior to Admission medications   Medication Sig Start Date End Date Taking? Authorizing Provider  acetaminophen (TYLENOL) 500 MG tablet Take 1,000 mg by mouth at bedtime.    Yes Historical Provider, MD  albuterol (PROVENTIL HFA;VENTOLIN HFA) 108 (90 Base) MCG/ACT inhaler Inhale 2 puffs into the lungs every 6 (six) hours as needed for wheezing or shortness of breath. 12/30/15  Yes Mercy Riding, MD  aspirin EC 81 MG EC tablet Take 1 tablet (81 mg total) by mouth daily. 04/12/15  Yes Luke K Kilroy, PA-C  atorvastatin (LIPITOR) 40 MG tablet Take 40 mg by mouth daily at 6 PM.   Yes Historical Provider, MD  budesonide-formoterol (SYMBICORT) 80-4.5 MCG/ACT inhaler Inhale 2 puffs into the lungs 2 (two) times daily. 09/12/12  Yes Tanda Rockers, MD  Ca Phosphate-Cholecalciferol (CALTRATE GUMMY BITES) 250-400 MG-UNIT CHEW Chew 2 tablets by mouth 2 (two) times daily.   Yes Historical Provider, MD  carbamazepine (TEGRETOL) 100 MG chewable tablet Chew 300 mg by mouth 2 (two) times daily.   Yes Historical Provider, MD  clopidogrel (PLAVIX) 75 MG tablet Take 1 tablet (75 mg total) by mouth daily. Patient taking differently: Take 75 mg by mouth every evening.  04/12/15  Yes Luke K Kilroy, PA-C  docusate sodium (COLACE) 100 MG capsule Take 100 mg by mouth every evening.    Yes Historical Provider, MD  finasteride (PROSCAR) 5 MG tablet Take 5 mg by mouth every morning.    Yes Historical Provider, MD  furosemide (LASIX) 40 MG tablet Take 40 mg by mouth every morning.   Yes Historical Provider, MD  ipratropium (ATROVENT HFA) 17 MCG/ACT inhaler Inhale 2 puffs into the lungs 2 (two) times daily as needed for wheezing.    Yes Historical Provider, MD  isosorbide mononitrate (IMDUR) 30 MG 24 hr tablet Take 90 mg by mouth daily.   Yes Historical Provider, MD  Melatonin 3 MG TABS Take 3 mg by mouth at bedtime.   Yes  Historical Provider, MD  metoprolol succinate (TOPROL-XL) 12.5 mg TB24 24 hr tablet Take 12.5 mg by mouth daily.   Yes Historical Provider, MD  Multiple Vitamins-Minerals (MULTIVITAMIN WITH MINERALS) tablet Take 1 tablet by mouth every evening.    Yes Historical Provider, MD  NITROSTAT 0.4 MG SL tablet PLACE 1 TABLET UNDER THE TONGUE EVERY 5 MINUTES ASDIRECTED BY PHYSICIAN 01/07/15  Yes Darlin Coco, MD  potassium chloride SA (K-DUR,KLOR-CON) 20 MEQ tablet Take 20 mEq by mouth daily.    Yes Historical Provider, MD  predniSONE (DELTASONE) 20 MG tablet Take 3 tablets (60 mg total) by mouth daily with breakfast. 04/05/16  Yes Archie Patten, MD  ranitidine (ZANTAC) 150 MG tablet TAKE 1 TABLET BY MOUTH 2 TIMES DAILY. Patient taking differently: Take 150 mg by mouth at bedtime 10/29/15  Yes Darlin Coco, MD  senna (SENOKOT) 8.6 MG tablet Take 1 tablet by mouth daily.    Yes Historical Provider, MD  sertraline (ZOLOFT) 100 MG tablet Take 150 mg by mouth every morning.    Yes Historical Provider, MD  tamsulosin (FLOMAX) 0.4 MG CAPS capsule Take 1 capsule (0.4 mg total) by mouth daily. 09/06/14  Yes Newcastle N Rumley, DO  predniSONE (STERAPRED UNI-PAK 21 TAB) 10 MG (21) TBPK tablet Take as directed. Patient not taking: Reported on 04/27/2016 01/04/16   Janora Norlander, DO    Scheduled Meds: . sodium chloride   Intravenous Once  . acetaminophen  1,000 mg Oral QHS  . aspirin EC  81 mg Oral q1800  . carbamazepine  300 mg Oral BID  . cefTRIAXone (ROCEPHIN)  IV  2 g Intravenous Q24H  . clopidogrel  75 mg Oral q1800  . enoxaparin (LOVENOX) injection  40 mg Subcutaneous Q24H  . famotidine  20 mg Oral Daily  . feeding supplement (ENSURE ENLIVE)  237 mL Oral BID BM  . finasteride  5 mg Oral Daily  . metoprolol tartrate  12.5 mg Oral BID  . mometasone-formoterol  2 puff Inhalation BID  . predniSONE  40 mg Oral Q breakfast  . sertraline  150 mg Oral Daily  . tamsulosin  0.4 mg Oral QPC supper    Infusions:   PRN Meds: albuterol, ipratropium   Allergies as of 04/27/2016  . (No Known Allergies)    Family History  Problem Relation Age of Onset  . Heart attack Mother   . Addison's disease Father   . Heart attack Sister   . Anesthesia problems Neg Hx   . Hypotension Neg Hx   . Malignant hyperthermia Neg Hx   . Pseudochol deficiency Neg Hx     Social History   Social History  . Marital status: Married    Spouse name: N/A  . Number of children: N/A  . Years of education: N/A   Occupational History  . Not on file.   Social History Main Topics  . Smoking status: Never Smoker  . Smokeless tobacco: Never Used  . Alcohol use No  . Drug use: No  . Sexual activity: Not on file   Other Topics Concern  . Not on file   Social History Narrative  . No narrative on file    REVIEW OF SYSTEMS: Constitutional:  some weakness, a few falls in the last month ENT:  No nose bleeds Pulm:  Flare of dyspnea  CV:  No palpitations, no LE edema.  GU:  + nocturia, urine is orange.   GI:  Per HPI Heme:  No unusual bleeding.  Bruises easily   Transfusions:  In 08/2013 after hip fracture and surgery  Neuro:  No headaches, no peripheral tingling or numbness Some insomnia.   Derm:  No itching, no rash or sores.  Endocrine:  No sweats or chills.  No polyuria or dysuria Immunization:  reviewed Travel:  None beyond local counties in last few months.    PHYSICAL EXAM: Vital signs in last 24 hours: Vitals:   04/29/16 0610 04/29/16 1208  BP: 114/60 (!) 149/70  Pulse: 62 63  Resp: 18 18  Temp: 98 F (36.7 C) 97.3 F (36.3 C)   Wt Readings from Last 3 Encounters:  04/29/16 68.6 kg (151 lb 3.2 oz)  04/26/16 72.1 kg (159 lb)  04/05/16 73 kg (161 lb)    General: somewhat frail, comfortable, alert Head:  No asymmetry or trauma  Eyes:  No icterus or pallor Ears:  Slightly HOH  Nose:  No congestion or discharge Mouth:  Clear, moist, tongue midline, throat benign Neck:  No  mass or JVD Lungs:  Clear bil.  No cough or SOB Heart: RRR.  No mrg.  S1/s2 present.  Pacemaker on upper left chest Abdomen:  Soft, NT, ND.  No mass or HSM.  No bruits. BS hypoactive. 30 mm bruise on abdomen  Rectal: deferred   Musc/Skeltl: no joint swelling, redness.  sme arthritic changes in hands.  Kyphosis.  Extremities:  No CCE  Neurologic:  Oriented x 3.  No limb weakness or tremor.  Talkative, provides lots of accurate details Skin:  No obvious jaundice Tattoos:  none   Psych:  Pleasant, calm.    Intake/Output from previous day: 08/30 0701 - 08/31 0700 In: 990 [P.O.:840; IV Piggyback:150] Out: 850 [Urine:850] Intake/Output this shift: Total I/O In: 240 [P.O.:240] Out: 350 [Urine:350]  LAB RESULTS:  Recent Labs  04/27/16 1840 04/28/16 0258 04/29/16 0158  WBC 8.9 7.5 8.5  HGB 9.0* 9.0* 7.8*  HCT 28.2* 28.5* 24.5*  PLT 280 261 268   BMET Lab Results  Component Value Date   NA 134 (L) 04/29/2016   NA 137 04/28/2016   NA 134 (L) 04/27/2016   K 3.6 04/29/2016   K 3.7 04/28/2016   K 3.7 04/27/2016   CL 101 04/29/2016   CL 104 04/28/2016   CL 101 04/27/2016   CO2 26 04/29/2016   CO2 27 04/28/2016   CO2 26 04/27/2016   GLUCOSE 102 (H) 04/29/2016   GLUCOSE 103 (H) 04/28/2016   GLUCOSE 113 (H) 04/27/2016   BUN 18 04/29/2016   BUN 15 04/28/2016   BUN 15 04/27/2016   CREATININE 0.76 04/29/2016   CREATININE 0.80 04/28/2016   CREATININE 0.86 04/27/2016   CALCIUM 9.4 04/29/2016   CALCIUM 9.4 04/28/2016   CALCIUM 9.5 04/27/2016   LFT  Recent Labs  04/27/16 1840 04/28/16 1014 04/29/16 0802  PROT 6.7 6.8 6.9  ALBUMIN 2.3* 2.3* 2.3*  AST 148* 153* 201*  ALT 110* 109* 129*  ALKPHOS 643* 655* 783*  BILITOT 1.3* 2.2* 2.5*  BILIDIR  --  1.5* 1.7*  IBILI  --  0.7 0.8   PT/INR Lab Results  Component Value Date   INR 1.16 04/11/2015   INR 1.16 01/23/2015   INR 1.09 08/29/2013   Hepatitis Panel No results for input(s): HEPBSAG, HCVAB, HEPAIGM, HEPBIGM  in the last 72 hours. C-Diff No components found for: CDIFF Lipase  No results found for: LIPASE  Drugs of Abuse  No results found for: LABOPIA, COCAINSCRNUR, LABBENZ, AMPHETMU, THCU, LABBARB   RADIOLOGY STUDIES: Dg Chest 2 View  Result Date: 04/27/2016 CLINICAL DATA:  Short of breath.  Hypertension.  History of asthma. 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 The cardiac silhouette is top-normal in size. No mediastinal or hilar masses or evidence of adenopathy. Mild linear scarring at the lung bases. Lungs otherwise clear. No pleural effusion or pneumothorax. Left anterior chest wall sequential pacemaker is stable and well positioned. Bony thorax is demineralized but grossly intact. IMPRESSION: No acute cardiopulmonary disease. Stable appearance from the prior study. Electronically Signed   By: Lajean Manes M.D.   On: 04/27/2016 15:31   Ct Abdomen Pelvis W Contrast  Result Date: 04/28/2016 CLINICAL DATA:  Epigastric pain EXAM: CT ABDOMEN AND PELVIS WITH CONTRAST TECHNIQUE: Multidetector CT imaging of the abdomen and pelvis was performed using the standard protocol following bolus administration of intravenous contrast. CONTRAST:  180m ISOVUE-300 IOPAMIDOL (ISOVUE-300) INJECTION 61%, 342mISOVUE-300 IOPAMIDOL (ISOVUE-300) INJECTION 61% COMPARISON:  02/28/2013 FINDINGS: Dependent atelectasis at the lung bases. Marked intra and extrahepatic biliary dilatation is present. There is a soft tissue filling defect in the distal common bile duct. Cholangiocarcinoma is not excluded. Pancreatic ducts are also very dilated. Tiny hypodensity in the right lobe of liver on image 30 is stable There is gallbladder wall thickening. The gallbladder is mildly distended. This is likely related to biliary obstruction. Spleen is unremarkable Adrenal glands are within normal limits Several benign appearing renal cysts are stable. Sigmoid diverticulosis. No evidence of  acute diverticulitis. No obvious mass in the colon. Prominent stool burden within the colon. No evidence of small-bowel obstruction Bladder is unremarkable.  Postoperative changes in the prostate. Atherosclerotic and iliac artery atherosclerotic calcifications. No abnormal retroperitoneal adenopathy Anterolisthesis L5 upon S1. Postoperative changes at this level. These findings are stable. No vertebral compression deformity. IMPRESSION: Findings above are worrisome for  biliary obstruction secondary to a mass at the distal common bile duct. Correlation with bilirubin levels is recommended. Gallbladder wall thickening is likely related to biliary obstruction. Electronically Signed   By: Marybelle Killings M.D.   On: 04/28/2016 22:15    ENDOSCOPIC STUDIES: Remote screening colonoscopies.    IMPRESSION:   *  Obstructing CBD mass with cholangitis, klebsiella bacteremia.  On Rocephin.    *  Normocytic anemia. FOBT in 01/2016 negative x 3.      PLAN:     *  Ercp with stent placement and bile duct brushing set for 1330 tomorrow.  Hold plavix, continue abx.  Risks of bleeding, perforation, inability to place stent d/w pt.   Case d/w Dr Ardis Hughs.  *  CA 19-9,    Azucena Freed  04/29/2016, 12:20 PM Pager: [phone]

## 2016-04-30 NOTE — Op Note (Signed)
[facility] Patient Name: [name] Procedure Date : 04/30/2016 MRN: [id number] Attending MD: Milus Banister , MD Date of Birth: [birth year] CSN: [id number] Age: 82 Admit Type: Inpatient Procedure:                ERCP Indications:              presented with fevers, GNR in blood; CT shows                            double duct sign, ? biliary mass; elevated liver                            tests Providers:                Milus Banister, MD, Carolynn Comment, RN, Elspeth Cho Tech., Technician, Rhae Lerner, CRNA Referring MD:             Basilia Jumbo, MD Medicines:                General Anesthesia Complications:            No immediate complications. Estimated blood loss:                            None Estimated Blood Loss:     Estimated blood loss: none. Procedure:                Pre-Anesthesia Assessment:                           - Prior to the procedure, a History and Physical                            was performed, and patient medications and                            allergies were reviewed. The patient's tolerance of                            previous anesthesia was also reviewed. The risks                            and benefits of the procedure and the sedation                            options and risks were discussed with the patient.                            All questions were answered, and informed consent                            was obtained. Prior Anticoagulants: The patient has  taken Plavix (clopidogrel), last dose was 2 days                            prior to procedure. ASA Grade Assessment: III - A                            patient with severe systemic disease. After                            reviewing the risks and benefits, the patient was                            deemed in satisfactory condition to undergo the                            procedure.     After obtaining informed consent, the scope was                            passed under direct vision. Throughout the                            procedure, the patient's blood pressure, pulse, and                            oxygen saturations were monitored continuously. The                            [id number]-[id number]) scope was introduced through                            the mouth, and used to inject contrast into and                            used to inject contrast into the ventral pancreatic                            duct. The ERCP was technically difficult and                            complex due to challenging cannulation because of                            inability to visualize the major papilla. The                            patient tolerated the procedure well. Scope In: Scope Out: Findings:      The duodenoscope was advance to the second duodenum. There was a       friable, circumferential polypoid mass (carpet like) centered around the       major papilla, about 4cm in length. The normal ampullary landmarks were       nearly completely obliterated by the lesion and there was a more solid,  firm, mass like appearance directly at the major papilla. The process       was not obstructing the duodenum lumen and there was a very obvious       interface between the lesion and the normal duodenum mucosa at the       distal edge of the lesion. See pictures. I was able to advance a .035       hydrawire (within a 32 Autotome) deeply into the main pancreatic duct.       Pancreatogram showed the duct was very dilated (7-34m, ectatic). I was       not able to advance a wire into the bile duct despite multiple attempts.       Eventually, further attempts were aborted and I performed mucosal       biopsies from several locations along the lesion with forceps. 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 The process is circumferential, about                            4cm long, causing biliary and pancreatic duct                            obstuction and dilation, centered around the major                            papilla that is bulbous and mass-like. Pancreatic                            duct was able to be cannulated but I could not                            access the bile duct due in part to near total                            obliteration of the normal ampullary structure. He                            will need PTC for biliary decompression (that is                            being arranged). I took multiple mucosal biopsies,                            if these do not prove neoplasm then repeat                            endoscopy could be performed with standard  gastroscope for more tissue. Recommendation:           - Return patient to hospital ward for ongoing care.                           - PTC with interventional radiology. Await path                            report from mucosal biopsies. Procedure Code(s):        --- Professional ---                           [phone], Endoscopic retrograde                            cholangiopancreatography (ERCP); with biopsy,                            single or multiple Diagnosis Code(s):        --- Professional ---                           K83.8, Other specified diseases of biliary tract CPT copyright 2016 American Medical Association. All rights reserved. The codes documented in this report are preliminary and upon coder review may  be revised to meet current compliance requirements. Milus Banister, MD 04/30/2016 3:17:04 PM This report has been signed electronically. Number of Addenda: 0

## 2016-04-30 NOTE — Anesthesia Postprocedure Evaluation (Signed)
Anesthesia Post Note  Patient: [name]  Procedure(s) Performed: Procedure(s) (LRB): ENDOSCOPIC RETROGRADE CHOLANGIOPANCREATOGRAPHY (ERCP) (N/A)  Patient location during evaluation: PACU Anesthesia Type: General Level of consciousness: awake and alert Pain management: pain level controlled Vital Signs Assessment: post-procedure vital signs reviewed and stable Respiratory status: spontaneous breathing, nonlabored ventilation, respiratory function stable and patient connected to nasal cannula oxygen Cardiovascular status: blood pressure returned to baseline and stable Postop Assessment: no signs of nausea or vomiting Anesthetic complications: no    Last Vitals:  Vitals:   04/30/16 1504 04/30/16 1515  BP: (!) 166/60 (!) 155/59  Pulse: 66 61  Resp: 16 14  Temp: 36.4 C     Last Pain:  Vitals:   04/30/16 1504  TempSrc: Oral  PainSc:                  Zenaida Deed

## 2016-04-30 NOTE — Progress Notes (Signed)
1745 resting comfrttably in bed.Awaiting for dinner tray. Been called

## 2016-04-30 NOTE — Interval H&P Note (Signed)
History and Physical Interval Note:  04/30/2016 12:57 PM  [name]  has presented today for surgery, with the diagnosis of cholangitis, mass in CBD  The various methods of treatment have been discussed with the patient and family. After consideration of risks, benefits and other options for treatment, the patient has consented to  Procedure(s): ENDOSCOPIC RETROGRADE CHOLANGIOPANCREATOGRAPHY (ERCP) (N/A) as a surgical intervention .  The patient's history has been reviewed, patient examined, no change in status, stable for surgery.  I have reviewed the patient's chart and labs.  Questions were answered to the patient's satisfaction.     Milus Banister

## 2016-04-30 NOTE — Progress Notes (Signed)
Recieved report from [facility] unit @1530 

## 2016-04-30 NOTE — Progress Notes (Signed)
Pt refusing bed alarm at this time. Will continue to monitor.

## 2016-04-30 NOTE — Transfer of Care (Signed)
Immediate Anesthesia Transfer of Care Note  Patient: [name]  Procedure(s) Performed: Procedure(s): ENDOSCOPIC RETROGRADE CHOLANGIOPANCREATOGRAPHY (ERCP) (N/A)  Patient Location: Endoscopy Unit  Anesthesia Type:General  Level of Consciousness: awake, alert  and oriented  Airway & Oxygen Therapy: Patient Spontanous Breathing and Patient connected to nasal cannula oxygen  Post-op Assessment: Report given to RN, Post -op Vital signs reviewed and stable and Patient moving all extremities X 4  Post vital signs: Reviewed and stable  Last Vitals:  Vitals:   04/30/16 1100 04/30/16 1255  BP: (!) 169/70 (!) 188/68  Pulse: 60 62  Resp:  16  Temp:  36.7 C    Last Pain:  Vitals:   04/30/16 1255  TempSrc: Oral  PainSc:       Patients Stated Pain Goal: 3 ([id number])  Complications: No apparent anesthesia complications

## 2016-04-30 NOTE — Progress Notes (Signed)
Family Medicine Teaching Service Daily Progress Note Intern Pager: [phone]  Patient name: [name] Medical record number: [id number] Date of birth: 05-22-1925 Age: 82 y.o. Gender: male  Primary Care Provider: Bufford Lope, DO Consultants: Cardiology Code Status: FULL  Pt Overview and Major Events to Date:  Admit 8/29 D/C Cefepime 8/31  Assessment and Plan: ANDON JEFFRES is a 82 y.o. male found to have bacteremia with fatigue, new dyspnea on exertion, new murmur. PMH is significant for HTN, CAD s/p CABG 1986 with redo 1993, PTCA 2008, and DES 2010, hx MI, LBBB, HFrEF (but recent ECHO 08/2014 with normal EF 55-60%, G1DD), Asthma/COPD, GERD, OA, Osteoporosis, Dyslipidemia, Peripheral Neuropathy, BPH with history or urinary retention, Pacemaker for tachy brady syndrome (03/2015), History of atrial flutter (no anticoagulation due to falls and anemia)   #Bacteremia: 2/2 blood cultures positive for gram negative rods resulted in less than 24hrs. Cards office following. Patient with pacemaker which puts him at a higher risk for endocarditis.  Lactic acid 1.3 normal. No white count. Has remained afebrile since admission. -One bottle of his bcx from cardiologist office grew Klebsiella (not able to follow these cxs in EPIC). Per ID rec from Dr. Megan Salon start IV CTX, transition to po abx for a total of 14 days of abx tx. Sensitive to CTX.  - Follow blood cxs obtained here prior to starting abx >> [id number]- consider first date of abx tx 8/29 - Repeat Bcx 8/31 neg at 24 hrs - urine cx no growth - TEE 8/30 showed no vegetations. Per cards recs, if no vegetations on pacemaker leads, will not plan extraction - can discharge home on oral antibiotics to complete 14 day course  #Mass at distal common bile duct -CT scan A/P to look for occult abscess. Showed findings worrisome for biliary obstruction secondary to a mass at the distal common bile duct. Direct bili high at 1.5. Total bili elevated as well 2.2.  Gallbladder wall thickening is likely related to biliary obstruction - Scheduled for ERCP today to evaluate mass.  #Low Hgb  -CBC this am with Hgb 9.9 -follow hgb, transfusion threshold <8  #Intermittent Chest Pain with H/o CAD s/p stent placement: Stable. Reports he has similar symptoms at baseline but has worsened during the past few week. No chest pain currently. - EKG showing sinus rhythm with LBBB but stable and unchanged from previous in May 2017 - troponins stable at 0.05. Monitor.  - ASA and Plavix - continue Lipitor  #Elevated LFTs. AST 148, ALT 110 elevated at admission. -repeat LFTs- AST 153, ALT 109, likely due to biliary mass, ERCP scheduled for today  #Chronic Steroid Use: Started on daily prednisone on August 7th for concern for temporal arteritis given Sed rate 41. Biopsy was negative. Started taper 8/29 as outpatient (from Prednisone 60mg  to 40mg  daily). - Prednisone 40mg  daily and taper down. (40mg x3days, 20mg x3days,10mg x3days). - On day 1 of 20 mg today, discharge home with appropriate amounts to finish taper  #Hypertension: Hypertensive yesterday to 160s SBP but normotensive this am.  -Continue  Imdur and metoprolol   #Hx of Atrial flutter: not on anticoagulation due to falls and anemia - metoprolol   -Cardiology to evaluate burden by device interrogation today 8/30. Not felt to be a candidate for oral anticoagulation with hx of falls and prior syncope  #Systolic CHF: Clinically mildly dry. On Lasix 40mg  daily and Kdur at home. - CMP - holding Lasix for now since clinically appeared dry on exam this afternoon. -  daily weights  #Constipation - added miralax   #Depression: - continue Zoloft   #HLD: - continue Lipitor  #Asthma/COPD: -continue Symbicort and albuterol PRN  #Peripheral Neuropathy:  - continue Tegretol   #BPH with history of urinary retention:   - cont finasteride and Flomax   #Osteoporosis:  - Calcium-Vit D   FEN/GI: heart  healthy Prophylaxis: Lovenox   Disposition: Admitted for evaluation of bacteremia. Dispo pending clinical improvement.  Subjective:  Patient feels well today, reports some fatigue. Is anxious to find out results from procedure that will be done today. Reports constipation until yesterday when he had 3 bowel movements, had to strain to have BM, no blood noted. Normally is constipated at home. Eats prunes usually to help.  Objective: Temp:  [98 F (36.7 C)-98.3 F (36.8 C)] 98.1 F (36.7 C) (09/01 1255) Pulse Rate:  [59-62] 62 (09/01 1255) Resp:  [16-18] 16 (09/01 1255) BP: (141-188)/(61-77) 188/68 (09/01 1255) SpO2:  [94 %-100 %] 98 % (09/01 1255) Weight:  [154 lb 6.4 oz (70 kg)] 154 lb 6.4 oz (70 kg) (09/01 1255) Physical Exam: Gen: pleasant elderly male, NAD sitting up in chair HEENT: PERRL, EOMI, normocephalic, pupils equal, MMM Cardiac: RRR, 2/6 systolic murmur, no heaves/thrills Resp: CTAB, normal effort on RA Abd: soft, +bs, no tenderness Ext: no edema noted Skin: multiple hematomas of arms and legs, no skin breakdown/ulcers noted  Psych: appropriate affect with normal mood  Laboratory:  Recent Labs Lab 04/28/16 0258 04/29/16 0158 04/30/16 0242  WBC 7.5 8.5 8.1  HGB 9.0* 7.8* 9.9*  HCT 28.5* 24.5* 30.9*  PLT 261 268 256    Recent Labs Lab 04/27/16 1840 04/28/16 0258 04/28/16 1014 04/29/16 0158 04/29/16 0802 04/30/16 0242  NA 134* 137  --  134*  --  135  K 3.7 3.7  --  3.6  --  3.8  CL 101 104  --  101  --  106  CO2 26 27  --  26  --  25  BUN 15 15  --  18  --  21*  CREATININE 0.86 0.80  --  0.76  --  0.76  CALCIUM 9.5 9.4  --  9.4  --  9.4  PROT 6.7  --  6.8  --  6.9  --   BILITOT 1.3*  --  2.2*  --  2.5*  --   ALKPHOS 643*  --  655*  --  783*  --   ALT 110*  --  109*  --  129*  --   AST 148*  --  153*  --  201*  --   GLUCOSE 113* 103*  --  102*  --  105*   Imaging/Diagnostic Tests:  No results found.    Steve Rattler, DO 04/30/2016, 2:11  PM PGY-1, [place] Intern pager: [phone], text pages welcome

## 2016-04-30 NOTE — Consult Note (Signed)
Chief Complaint: Patient was seen in consultation today for Int/ext biliary drain placement at the request of Dr Owens Loffler  Referring Physician(s): Dr Owens Loffler   Supervising Physician: Arne Cleveland  Patient Status: Inpatient  History of Present Illness: [name] is a 82 y.o. male   Hx CAD/MI/ CABG 1986 and 1993 CHF; A flutter On Plavix and ASA Last dose Plavix 04/28/16 Remote hx prostate ca  Has has off and on fevers for last few weeks N/V and wt loss; abd pain TEE neg for endocarditis/ vegetation Blood Cx 8/29 + Klebsiella  Increased LFTs  CT 8/30: IMPRESSION: Findings above are worrisome for biliary obstruction secondary to a mass at the distal common bile duct. Correlation with bilirubin levels is recommended. Gallbladder wall thickening is likely related to biliary Obstruction.  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 The process is circumferential, about 4cm long, causing biliary and pancreatic duct obstuction and dilation, centered around the major papilla that is bulbous and mass-like. Pancreatic duct was able to be cannulated but I could not access the bile duct due in part to near total obliteration of the normal ampullary structure. He will need PTC for biliary decompression (that is being arranged). I took multiple mucosal biopsies, if these do not prove neoplasm then repeat endoscopy could be performed with standard gastroscope for more tissue.  Recommendation:- Return patient to hospital ward for ongoing care. - PTC with interventional radiology. Await path report from mucosal biopsies. Per Dr Owens Loffler  Pt tentatively scheduled for biliary drain placement in IR in am Continue to hold Plavix Discussed with Dr Vernard Gambles   Past Medical History:  Diagnosis Date  . Anemia 2014   was on iron pills and then was able to come off  . Arthritis    "all over"  . Asthma       . Atrial flutter ([place])    a. diagnosed 03/2015  . Bacteremia 03/2016  . Complication of anesthesia    B/P dropped low and had to stay in recovery longer  . Coronary artery disease    a. s/p CABG 1986 b. redo CABG 1993 c. PTCA 2008 d. DES 2010  . Depression       . Diverticulosis   . GERD (gastroesophageal reflux disease)       . Hepatitis    hepatitis- long time ago, occupational contamination   . Hiatal hernia   . Hyperlipemia   . Hypertension       . Insomnia       . Kidney stones   . Myocardial infarction ([place])   . On home oxygen therapy    "2L; just at night" (01/31/2015)  . Peripheral neuropathy ([place])   . Peripheral vascular disease ([facility])    thrombus- in leg- many yrs. ago- ?R- coumadin x1 mth  . Pneumonia 1960's  . Presence of permanent cardiac pacemaker   . Prostate cancer [facility])    radiation therapy- 2002  . Systolic CHF ([place])       . VT (ventricular tachycardia) ([place]) 06/2012   a. in the setting of STEMI b. on amio during hospitalization    Past Surgical History:  Procedure Laterality Date  . APPENDECTOMY  1943  . CATARACT EXTRACTION W/ INTRAOCULAR LENS  IMPLANT, BILATERAL    . COLONOSCOPY    . CORONARY ARTERY BYPASS GRAFT  1986;  1993   CABG X 5; CABG X 1  .  CYSTOSCOPY W/ URETERAL STENT PLACEMENT Right 02/28/2013   Procedure: CYSTOSCOPY WITH RETROGRADE PYELOGRAM/URETERAL STENT PLACEMENT;  Surgeon: Dutch Gray, MD;  Location: [facility];  Service: Urology;  Laterality: Right;  . CYSTOSCOPY WITH URETEROSCOPY AND STENT PLACEMENT Right 03/23/2013   Procedure: RIGHT URETEROSCOPY, LASER LITHO AND STENT PLACEMENT;  Surgeon: Fredricka Bonine, MD;  Location: [facility];  Service: Urology;  Laterality: Right;  . EP IMPLANTABLE DEVICE N/A 04/11/2015   Procedure: Pacemaker Implant;  Surgeon: Will Meredith Leeds, MD;  Location: [place] CV LAB;  Service: Cardiovascular;  Laterality: N/A;  . FOOT FUSION Left 2000   heel  . FRACTURE SURGERY      heel- crushed -2002,  (hardware)   . HAMMER TOE SURGERY Right    little toe  . HARDWARE REMOVAL Right 2002   great toe; Dr. Sharol Given  . HOLMIUM LASER APPLICATION Right 1/93/7902   Procedure: HOLMIUM LASER APPLICATION;  Surgeon: Fredricka Bonine, MD;  Location: [facility];  Service: Urology;  Laterality: Right;  . INGUINAL HERNIA REPAIR Left 01/31/2015   Procedure: OPEN REPAIR RECURRENT LEFT INGUINAL HERNIA;  Surgeon: Fanny Skates, MD;  Location: [place];  Service: General;  Laterality: Left;  . INSERTION OF MESH Left 01/31/2015   Procedure: INSERTION OF MESH;  Surgeon: Fanny Skates, MD;  Location: [place];  Service: General;  Laterality: Left;  . INTRAMEDULLARY (IM) NAIL INTERTROCHANTERIC Right 08/29/2013   Procedure: INTRAMEDULLARY (IM) NAIL INTERTROCHANTRIC;  Surgeon: Marianna Payment, MD;  Location: [place];  Service: Orthopedics;  Laterality: Right;  . JOINT REPLACEMENT     R great toe  . KNEE ARTHROSCOPY Right X 2  . LEFT AND RIGHT HEART CATHETERIZATION WITH CORONARY ANGIOGRAM N/A 07/15/2012   Procedure: LEFT AND RIGHT HEART CATHETERIZATION WITH CORONARY ANGIOGRAM;  Surgeon: Jettie Booze, MD;  Location: [facility] CATH LAB;  Service: Cardiovascular;  Laterality: N/A;  . LUMBAR LAMINECTOMY/DECOMPRESSION MICRODISCECTOMY  08/10/2011   Procedure: LUMBAR LAMINECTOMY/DECOMPRESSION MICRODISCECTOMY;  Surgeon: Cooper Render Pool;  Location: [place] NEURO ORS;  Service: Neurosurgery;  Laterality: Right;  RIGHT Lumbar five-sacral one LAMINECTOMY, MICRODISCECTOMY  . LUMBAR WOUND DEBRIDEMENT  08/22/2011   Procedure: LUMBAR WOUND DEBRIDEMENT;  Surgeon: Eustace Moore;  Location: [place] NEURO ORS;  Service: Neurosurgery;  Laterality: N/A;  Repair of CSF Leak requiring laminectomy  . PERCUTANEOUS CORONARY STENT INTERVENTION (PCI-S) N/A 07/15/2012   Procedure: PERCUTANEOUS CORONARY STENT INTERVENTION (PCI-S);  Surgeon: Jettie Booze, MD;  Location: [facility] CATH LAB;  Service: Cardiovascular;  Laterality: N/A;  . RHINOPLASTY   [id number]  . TEE WITHOUT CARDIOVERSION N/A 04/28/2016   Procedure: TRANSESOPHAGEAL ECHOCARDIOGRAM (TEE);  Surgeon: Skeet Latch, MD;  Location: [facility] ENDOSCOPY;  Service: Cardiovascular;  Laterality: N/A;  . TONSILLECTOMY  1940  . TRANSURETHRAL RESECTION OF PROSTATE  x2    Allergies: Review of patient's allergies indicates no known allergies.  Medications: Prior to Admission medications   Medication Sig Start Date End Date Taking? Authorizing Provider  acetaminophen (TYLENOL) 500 MG tablet Take 1,000 mg by mouth at bedtime.    Yes Historical Provider, MD  albuterol (PROVENTIL HFA;VENTOLIN HFA) 108 (90 Base) MCG/ACT inhaler Inhale 2 puffs into the lungs every 6 (six) hours as needed for wheezing or shortness of breath. 12/30/15  Yes Mercy Riding, MD  aspirin EC 81 MG EC tablet Take 1 tablet (81 mg total) by mouth daily. 04/12/15  Yes Luke K Kilroy, PA-C  atorvastatin (LIPITOR) 40 MG tablet Take 40 mg by mouth daily at 6 PM.   Yes Historical  Provider, MD  budesonide-formoterol (SYMBICORT) 80-4.5 MCG/ACT inhaler Inhale 2 puffs into the lungs 2 (two) times daily. 09/12/12  Yes Tanda Rockers, MD  Ca Phosphate-Cholecalciferol (CALTRATE GUMMY BITES) 250-400 MG-UNIT CHEW Chew 2 tablets by mouth 2 (two) times daily.   Yes Historical Provider, MD  carbamazepine (TEGRETOL) 100 MG chewable tablet Chew 300 mg by mouth 2 (two) times daily.   Yes Historical Provider, MD  clopidogrel (PLAVIX) 75 MG tablet Take 1 tablet (75 mg total) by mouth daily. Patient taking differently: Take 75 mg by mouth every evening.  04/12/15  Yes Luke K Kilroy, PA-C  docusate sodium (COLACE) 100 MG capsule Take 100 mg by mouth every evening.    Yes Historical Provider, MD  finasteride (PROSCAR) 5 MG tablet Take 5 mg by mouth every morning.    Yes Historical Provider, MD  furosemide (LASIX) 40 MG tablet Take 40 mg by mouth every morning.   Yes Historical Provider, MD  ipratropium (ATROVENT HFA) 17 MCG/ACT inhaler Inhale 2 puffs into  the lungs 2 (two) times daily as needed for wheezing.    Yes Historical Provider, MD  isosorbide mononitrate (IMDUR) 30 MG 24 hr tablet Take 90 mg by mouth daily.   Yes Historical Provider, MD  Melatonin 3 MG TABS Take 3 mg by mouth at bedtime.   Yes Historical Provider, MD  metoprolol succinate (TOPROL-XL) 12.5 mg TB24 24 hr tablet Take 12.5 mg by mouth daily.   Yes Historical Provider, MD  Multiple Vitamins-Minerals (MULTIVITAMIN WITH MINERALS) tablet Take 1 tablet by mouth every evening.    Yes Historical Provider, MD  NITROSTAT 0.4 MG SL tablet PLACE 1 TABLET UNDER THE TONGUE EVERY 5 MINUTES ASDIRECTED BY PHYSICIAN 01/07/15  Yes Darlin Coco, MD  potassium chloride SA (K-DUR,KLOR-CON) 20 MEQ tablet Take 20 mEq by mouth daily.    Yes Historical Provider, MD  predniSONE (DELTASONE) 20 MG tablet Take 3 tablets (60 mg total) by mouth daily with breakfast. 04/05/16  Yes Archie Patten, MD  ranitidine (ZANTAC) 150 MG tablet TAKE 1 TABLET BY MOUTH 2 TIMES DAILY. Patient taking differently: Take 150 mg by mouth at bedtime 10/29/15  Yes Darlin Coco, MD  senna (SENOKOT) 8.6 MG tablet Take 1 tablet by mouth daily.    Yes Historical Provider, MD  sertraline (ZOLOFT) 100 MG tablet Take 150 mg by mouth every morning.    Yes Historical Provider, MD  tamsulosin (FLOMAX) 0.4 MG CAPS capsule Take 1 capsule (0.4 mg total) by mouth daily. 09/06/14  Yes [place] N Rumley, DO  predniSONE (STERAPRED UNI-PAK 21 TAB) 10 MG (21) TBPK tablet Take as directed. Patient not taking: Reported on 04/27/2016 01/04/16   Janora Norlander, DO     Family History  Problem Relation Age of Onset  . Heart attack Mother   . Addison's disease Father   . Heart attack Sister   . Anesthesia problems Neg Hx   . Hypotension Neg Hx   . Malignant hyperthermia Neg Hx   . Pseudochol deficiency Neg Hx     Social History   Social History  . Marital status: Married    Spouse name: N/A  . Number of children: N/A  . Years of  education: N/A   Social History Main Topics  . Smoking status: Never Smoker  . Smokeless tobacco: Never Used  . Alcohol use No  . Drug use: No  . Sexual activity: Not Asked   Other Topics Concern  . None   Social History Narrative  .  None     Review of Systems: A 12 point ROS discussed and pertinent positives are indicated in the HPI above.  All other systems are negative.  Review of Systems  Constitutional: Positive for activity change, appetite change, fatigue, fever and unexpected weight change.  Respiratory: Positive for shortness of breath.   Cardiovascular: Negative for chest pain.  Gastrointestinal: Positive for abdominal pain and nausea.  Musculoskeletal: Positive for gait problem.  Neurological: Positive for weakness.  Psychiatric/Behavioral: Negative for behavioral problems and confusion.    Vital Signs: BP (!) 181/68   Pulse 62   Temp 97.6 F (36.4 C) (Oral)   Resp 18   Ht _0  (1.778 m)   Wt 154 lb 6.4 oz (70 kg)   SpO2 97%   BMI 22.15 kg/m   Physical Exam  Constitutional: He is oriented to person, place, and time.  Cardiovascular: Normal rate, regular rhythm and normal heart sounds.   Pulmonary/Chest: Effort normal.  Abdominal: Soft. Bowel sounds are normal. There is tenderness.  Musculoskeletal: Normal range of motion.  Neurological: He is alert and oriented to person, place, and time.  Skin: Skin is warm and dry.  Psychiatric: He has a normal mood and affect. His behavior is normal. Judgment and thought content normal.  Just out of Endo Consented with wife at bedside  Nursing note and vitals reviewed.   Mallampati Score:  MD Evaluation Airway: WNL Heart: WNL Abdomen: WNL Chest/ Lungs: WNL ASA  Classification: 3 Mallampati/Airway Score: One  Imaging: Dg Chest 2 View  Result Date: 04/27/2016 CLINICAL DATA:  Short of breath.  Hypertension.  History of asthma. EXAM: CHEST  2 VIEW COMPARISON:  01/01/2016 FINDINGS: Changes from CABG  surgery are stable. The cardiac silhouette is top-normal in size. No mediastinal or hilar masses or evidence of adenopathy. Mild linear scarring at the lung bases. Lungs otherwise clear. No pleural effusion or pneumothorax. Left anterior chest wall sequential pacemaker is stable and well positioned. Bony thorax is demineralized but grossly intact. IMPRESSION: No acute cardiopulmonary disease. Stable appearance from the prior study. Electronically Signed   By: Lajean Manes M.D.   On: 04/27/2016 15:31   Ct Abdomen Pelvis W Contrast  Result Date: 04/28/2016 CLINICAL DATA:  Epigastric pain EXAM: CT ABDOMEN AND PELVIS WITH CONTRAST TECHNIQUE: Multidetector CT imaging of the abdomen and pelvis was performed using the standard protocol following bolus administration of intravenous contrast. CONTRAST:  114m ISOVUE-300 IOPAMIDOL (ISOVUE-300) INJECTION 61%, 368mISOVUE-300 IOPAMIDOL (ISOVUE-300) INJECTION 61% COMPARISON:  02/28/2013 FINDINGS: Dependent atelectasis at the lung bases. Marked intra and extrahepatic biliary dilatation is present. There is a soft tissue filling defect in the distal common bile duct. Cholangiocarcinoma is not excluded. Pancreatic ducts are also very dilated. Tiny hypodensity in the right lobe of liver on image 30 is stable There is gallbladder wall thickening. The gallbladder is mildly distended. This is likely related to biliary obstruction. Spleen is unremarkable Adrenal glands are within normal limits Several benign appearing renal cysts are stable. Sigmoid diverticulosis. No evidence of acute diverticulitis. No obvious mass in the colon. Prominent stool burden within the colon. No evidence of small-bowel obstruction Bladder is unremarkable.  Postoperative changes in the prostate. Atherosclerotic and iliac artery atherosclerotic calcifications. No abnormal retroperitoneal adenopathy Anterolisthesis L5 upon S1. Postoperative changes at this level. These findings are stable. No vertebral  compression deformity. IMPRESSION: Findings above are worrisome for biliary obstruction secondary to a mass at the distal common bile duct. Correlation with bilirubin levels is recommended. Gallbladder  wall thickening is likely related to biliary obstruction. Electronically Signed   By: Marybelle Killings M.D.   On: 04/28/2016 22:15    Labs:  CBC:  Recent Labs  04/27/16 1840 04/28/16 0258 04/29/16 0158 04/30/16 0242  WBC 8.9 7.5 8.5 8.1  HGB 9.0* 9.0* 7.8* 9.9*  HCT 28.2* 28.5* 24.5* 30.9*  PLT 280 261 268 256    COAGS: No results for input(s): INR, APTT in the last 8760 hours.  BMP:  Recent Labs  04/27/16 1840 04/28/16 0258 04/29/16 0158 04/30/16 0242  NA 134* 137 134* 135  K 3.7 3.7 3.6 3.8  CL 101 104 101 106  CO2 _0 GLUCOSE 113* 103* 102* 105*  BUN _1 21*  CALCIUM 9.5 9.4 9.4 9.4  CREATININE 0.86 0.80 0.76 0.76  GFRNONAA >60 >60 >60 >60  GFRAA >60 >60 >60 >60    LIVER FUNCTION TESTS:  Recent Labs  01/16/16 1559 04/27/16 1840 04/28/16 1014 04/29/16 0802  BILITOT 0.3 1.3* 2.2* 2.5*  AST 25 148* 153* 201*  ALT 15 110* 109* 129*  ALKPHOS 83 643* 655* 783*  PROT 6.7 6.7 6.8 6.9  ALBUMIN 3.8 2.3* 2.3* 2.3*    TUMOR MARKERS: No results for input(s): AFPTM, CEA, CA199, CHROMGRNA in the last 8760 hours.  Assessment and Plan:  Obstructive Common bile duct mass Elevated LFTs Endo unsuccessful in placing stent 9/1 Bx pending from Endo Off Plavix now LD 8/30 Plan for Internal/external biliary drain placement in IR 9/2 Risks and Benefits discussed with the patient including, but not limited to bleeding, infection which may lead to sepsis or even death and damage to adjacent structures. All of the patient's questions were answered, patient is agreeable to proceed. Consent signed and in chart.  Thank you for this interesting consult.  I greatly enjoyed meeting Leslee A Mutschler and look forward to participating in their care.  A copy of this  report was sent to the requesting provider on this date.  Electronically Signed: Maceo Hernan A 04/30/2016, 3:52 PM   I spent a total of 40 Minutes    in face to face in clinical consultation, greater than 50% of which was counseling/coordinating care for I/E biliary drain

## 2016-04-30 NOTE — Anesthesia Preprocedure Evaluation (Addendum)
Anesthesia Evaluation  Patient identified by MRN, date of birth, ID band Patient awake    Reviewed: Allergy & Precautions, NPO status , Patient's Chart, lab work & pertinent test results  History of Anesthesia Complications (+) history of anesthetic complications  Airway Mallampati: I  TM Distance: >3 FB Neck ROM: Full    Dental  (+) Teeth Intact, Dental Advisory Given   Pulmonary COPD,  oxygen dependent,    breath sounds clear to auscultation       Cardiovascular hypertension, Pt. on medications + angina with exertion + CAD, + Cardiac Stents, + CABG, + Peripheral Vascular Disease and + DOE  + dysrhythmias + pacemaker  Rhythm:Regular Rate:Normal     Neuro/Psych  Headaches, PSYCHIATRIC DISORDERS Depression    GI/Hepatic hiatal hernia, GERD  Medicated,(+) Hepatitis -  Endo/Other    Renal/GU Renal disease     Musculoskeletal  (+) Arthritis ,   Abdominal   Peds  Hematology  (+) anemia ,   Anesthesia Other Findings   Reproductive/Obstetrics                             Anesthesia Physical  Anesthesia Plan  ASA: IV  Anesthesia Plan: General   Post-op Pain Management:    Induction: Intravenous  Airway Management Planned: Oral ETT  Additional Equipment:   Intra-op Plan:   Post-operative Plan: Extubation in OR  Informed Consent: I have reviewed the patients History and Physical, chart, labs and discussed the procedure including the risks, benefits and alternatives for the proposed anesthesia with the patient or authorized representative who has indicated his/her understanding and acceptance.   Dental advisory given  Plan Discussed with: CRNA, Anesthesiologist and Surgeon  Anesthesia Plan Comments:        Anesthesia Quick Evaluation

## 2016-04-30 NOTE — Progress Notes (Signed)
1600 Transferred in from Endoscopy unit via bed Awake , alert and oriented . No apparent distress. Kept comfortable in bed Cardiac monitor placed back on . CMT aware

## 2016-04-30 NOTE — Care Management Important Message (Signed)
Important Message  Patient Details  Name: [name] MRN: [id number] Date of Birth: Dec 22, 1924   Medicare Important Message Given:  Yes    Ikeya Brockel Montine Circle 04/30/2016, 10:23 AM

## 2016-05-01 ENCOUNTER — Inpatient Hospital Stay (HOSPITAL_COMMUNITY): Payer: Medicare Other

## 2016-05-01 ENCOUNTER — Encounter (HOSPITAL_COMMUNITY): Payer: Self-pay | Admitting: Interventional Radiology

## 2016-05-01 DIAGNOSIS — K8309 Other cholangitis: Secondary | ICD-10-CM

## 2016-05-01 DIAGNOSIS — K838 Other specified diseases of biliary tract: Secondary | ICD-10-CM

## 2016-05-01 DIAGNOSIS — K830 Cholangitis: Secondary | ICD-10-CM

## 2016-05-01 HISTORY — PX: IR GENERIC HISTORICAL: IMG1180011

## 2016-05-01 LAB — PROTIME-INR
INR: 1.18
Prothrombin Time: 15 seconds (ref 11.4–15.2)

## 2016-05-01 LAB — CBC
HEMATOCRIT: 28.7 % — AB (ref 39.0–52.0)
HEMOGLOBIN: 9.1 g/dL — AB (ref 13.0–17.0)
MCH: 29.3 pg (ref 26.0–34.0)
MCHC: 31.7 g/dL (ref 30.0–36.0)
MCV: 92.3 fL (ref 78.0–100.0)
Platelets: 246 10*3/uL (ref 150–400)
RBC: 3.11 MIL/uL — ABNORMAL LOW (ref 4.22–5.81)
RDW: 14.9 % (ref 11.5–15.5)
WBC: 7.1 10*3/uL (ref 4.0–10.5)

## 2016-05-01 LAB — COMPREHENSIVE METABOLIC PANEL
ALBUMIN: 2 g/dL — AB (ref 3.5–5.0)
ALK PHOS: 713 U/L — AB (ref 38–126)
ALT: 128 U/L — ABNORMAL HIGH (ref 17–63)
AST: 193 U/L — AB (ref 15–41)
Anion gap: 8 (ref 5–15)
BILIRUBIN TOTAL: 1.5 mg/dL — AB (ref 0.3–1.2)
BUN: 20 mg/dL (ref 6–20)
CALCIUM: 9.3 mg/dL (ref 8.9–10.3)
CO2: 24 mmol/L (ref 22–32)
CREATININE: 0.76 mg/dL (ref 0.61–1.24)
Chloride: 105 mmol/L (ref 101–111)
GFR calc Af Amer: >60 mL/min (ref >60)
GFR calc non Af Amer: >60 mL/min (ref >60)
GLUCOSE: 79 mg/dL (ref 65–99)
Potassium: 3.7 mmol/L (ref 3.5–5.1)
Sodium: 137 mmol/L (ref 135–145)
TOTAL PROTEIN: 6 g/dL — AB (ref 6.5–8.1)

## 2016-05-01 LAB — TROPONIN I
TROPONIN I: 0.04 ng/mL — AB (ref <0.03)
Troponin I: 0.04 ng/mL (ref <0.03)
Troponin I: 0.03 ng/mL (ref <0.03)
Troponin I: 0.03 ng/mL (ref <0.03)

## 2016-05-01 LAB — CANCER ANTIGEN 19-9: CA 19 9: 91 U/mL — AB (ref 0–35)

## 2016-05-01 MED ORDER — IOPAMIDOL (ISOVUE-300) INJECTION 61%
INTRAVENOUS | Status: AC
Start: 2016-05-01 — End: 2016-05-01
  Administered 2016-05-01: 15 mL
  Filled 2016-05-01: qty 50

## 2016-05-01 MED ORDER — MIDAZOLAM HCL 2 MG/2ML IJ SOLN
INTRAMUSCULAR | Status: AC
  Filled 2016-05-01: qty 2

## 2016-05-01 MED ORDER — PREDNISONE 20 MG PO TABS
20.0000 mg | ORAL_TABLET | Freq: Every day | ORAL | Status: DC
  Administered 2016-05-02: 20 mg via ORAL
  Filled 2016-05-01: qty 1

## 2016-05-01 MED ORDER — TRAMADOL HCL 50 MG PO TABS
50.0000 mg | ORAL_TABLET | Freq: Two times a day (BID) | ORAL | Status: DC | PRN
  Administered 2016-05-01: 50 mg via ORAL
  Filled 2016-05-01: qty 1

## 2016-05-01 MED ORDER — IBUPROFEN 600 MG PO TABS
600.0000 mg | ORAL_TABLET | Freq: Four times a day (QID) | ORAL | Status: DC | PRN
  Filled 2016-05-01: qty 1

## 2016-05-01 MED ORDER — MIDAZOLAM HCL 2 MG/2ML IJ SOLN
INTRAMUSCULAR | Status: AC | PRN
Start: 2016-05-01 — End: 2016-05-01
  Administered 2016-05-01: 0.5 mg via INTRAVENOUS

## 2016-05-01 MED ORDER — ISOSORBIDE MONONITRATE ER 30 MG PO TB24
30.0000 mg | ORAL_TABLET | Freq: Every day | ORAL | Status: DC
  Administered 2016-05-01 – 2016-05-02 (×2): 30 mg via ORAL
  Filled 2016-05-01 (×2): qty 1

## 2016-05-01 MED ORDER — FENTANYL CITRATE (PF) 100 MCG/2ML IJ SOLN
INTRAMUSCULAR | Status: AC | PRN
  Administered 2016-05-01: 25 ug via INTRAVENOUS

## 2016-05-01 MED ORDER — LIDOCAINE HCL 1 % IJ SOLN
INTRAMUSCULAR | Status: AC
  Filled 2016-05-01: qty 20

## 2016-05-01 MED ORDER — ENOXAPARIN SODIUM 40 MG/0.4ML ~~LOC~~ SOLN
40.0000 mg | SUBCUTANEOUS | Status: DC
Start: 2016-05-02 — End: 2016-05-02
  Administered 2016-05-02: 40 mg via SUBCUTANEOUS
  Filled 2016-05-01: qty 0.4

## 2016-05-01 MED ORDER — LIDOCAINE HCL 1 % IJ SOLN
INTRAMUSCULAR | Status: AC | PRN
  Administered 2016-05-01: 10 mL

## 2016-05-01 MED ORDER — FENTANYL CITRATE (PF) 100 MCG/2ML IJ SOLN
INTRAMUSCULAR | Status: AC
  Filled 2016-05-01: qty 2

## 2016-05-01 NOTE — Sedation Documentation (Addendum)
Patient denies pain and is resting comfortably.  

## 2016-05-01 NOTE — Progress Notes (Addendum)
   Patient Name: [name] Date of Encounter: 05/01/2016, 1:55 PM    Subjective  No fever or chills. T. Bili is slightly lower this AM   Objective  BP (!) 150/70   Pulse 65   Temp 98.2 F (36.8 C) (Oral)   Resp 20   Ht _0  (1.778 m)   Wt 152 lb 4.8 oz (69.1 kg) Comment: c scale  SpO2 93% Comment: RA  BMI 21.85 kg/m  General:   no acute distress Eyes:  anicteric. Abdomen:  soft, non-tender, no hepatosplenomegaly, hernia, or mass and BS+.  Extremities:   no edema Neuro:  A&O x 3.  Psych:  appropriate mood and  Affect.     Assessment and Plan  33 yr M admitted with bacteremia, CBD obstruction, presentation concerning for possible cholangitis. Hemodynamically stable. S/p ERCP yesterday with periampullary mucosal changes concerning for neoplasm (Adenoma vs malignancy), unsuccessful to cannulate CBD.  Plan for PTC today for biliary decompression Await pathology report from periampullary biopsies    K. Denzil Magnuson , MD [phone] Mon-Fri 8a-5p [phone] after 5p, weekends, holidays

## 2016-05-01 NOTE — Progress Notes (Signed)
Family Medicine Teaching Service Daily Progress Note Intern Pager: [phone]  Patient name: [name] Medical record number: [id number] Date of birth: 06/07/1925 Age: 82 y.o. Gender: male  Primary Care Provider: Bufford Lope, DO Consultants: Cardiology Code Status: FULL  Pt Overview and Major Events to Date:  8/29: Admit to FPTS 8/31: Perez/C Cefepime  9/1: ERCP showing suspicious periampullary neoplastic Shane  9/2: IR to place biliary drains  [name] is a 82 y.o. male found to have bacteremia with fatigue, new dyspnea on exertion, new murmur. PMH is significant for HTN, CAD s/p CABG 1986 with redo 1993, PTCA 2008, and DES 2010, Shane MI, LBBB, HFrEF (but recent ECHO 08/2014 with normal EF 55-60%, G1DD), Asthma/COPD, GERD, OA, Osteoporosis, Dyslipidemia, Shane Neuropathy, Shane with history or urinary retention, Pacemaker for tachy brady syndrome (03/2015), History of atrial flutter (no anticoagulation due to falls and anemia)   #Klebsiella Bacteremia: 2/2 blood cultures positive for gram negative rods resulted in less than 24hrs, one bottle of his bcx from cardiologist office grew Klebsiella (not able to follow these cxs in EPIC). Cards office following. Patient with pacemaker which puts him at a higher risk for endocarditis,TEE 8/30 showed no vegetations. Per cards recs, if no vegetations on pacemaker leads, will not plan extraction. Lactic acid 1.3 normal on 8/31. WBC nml. Has remained afebrile since admission..  - Per ID recs, continue IV CTX (8/31-  ). Will need 14 days total of abx (stop date on 9/11). Can transition to PO Bactrim per ID if discharged prior to 9/11.  - Repeat Blood and urine cx on 8/31 NGTD, continue to follow - Monitor for fever  #[name]: CT scan worrisome for biliary Perez secondary to a Shane at the distal common bile duct. Direct bili high at 1.5. Total bili elevated as well 2.2.  Gallbladder wall thickening is likely related to biliary Perez. ERCP on 9/1 showed suspected neoplastic periampullary Shane. Marker CA 19-9 elevated at 91 which is concerning for pancreatic cancer.  - IR to place internal and external biliary drain today - Will consult oncology today  #Low Hgb. 9.9>9.1 -Daily CBCs -follow hgb, transfusion threshold <8  #Intermittent Chest Pain with H/o CAD s/p stent placement: Stable. Reports he has similar symptoms at baseline but has worsened during the past few week. No chest pain currently. EKG showing sinus rhythm with LBBB but stable and unchanged from previous in May 2017. Troponins stable at 0.05.  - Continue to monitor.  - ASA and Plavix - continue Perez  #Elevated LFTs. Continue to be elevated - IR intervention for biliary Perez as above - Daily CMP  #Chronic Steroid Use: Started on daily prednisone on August 7th for concern for temporal arteritis given Sed rate 41. Biopsy was negative. Started Prednisone taper on  8/29; 40mg x3days, 20mg x3days,10mg x3days. - Continue steroid taper, now on day 1/3 of 20 mg dosing  #Hypertension: Normotensive this AM  -Continue  Imdur and Perez   #[name]   -Cardiology following   #[name]  #[name]   #  [name]   #[name]  #[name]  #[name]   #[name]   #[name]   [name]   Disposition: Continue to monitor inpatient for biliary drain today and IV antibiotics for bacteremia.  Subjective:  Patient has no complaints today. No acute events overnight. Is awaiting IR procedure.   Objective: Temp:  [97.4 F (36.3 C)-98.1 F (36.7 C)] 97.4 F (36.3 C) (09/02 0606) Pulse Rate:  [59-66] 59 (09/02 0606) Resp:  [14-25] 18 (09/02 0606) BP: (129-188)/(59-70) 131/65 (09/02 0606) SpO2:  [95 %-99 %] 97 % (09/02 0905) Weight:  [152 lb 4.8 oz (69.1 kg)-154 lb 6.4 oz (70 kg)] 152 lb 4.8 oz (69.1 kg) (09/02 0606) Physical Exam: Gen: pleasant elderly male, NAD sitting up in chair HEENT: PERRL, EOMI, normocephalic, pupils equal, MMM Cardiac: RRR, 2/6 Shane murmur, no heaves/thrills Resp: CTAB, normal effort on RA Abd: soft, +bs, no tenderness Ext: no edema noted Skin: multiple hematomas of arms and legs, no skin breakdown/ulcers noted  Psych: appropriate affect with normal mood  Laboratory:  Recent Labs Lab 04/29/16 0158 04/30/16 0242 05/01/16 0221  WBC 8.5 8.1 7.1  HGB 7.8* 9.9* 9.1*  HCT 24.5* 30.9* 28.7*  PLT 268 256 246    Recent Labs Lab 04/28/16 1014 04/29/16 0158 04/29/16 0802 04/30/16 0242 05/01/16 0221  NA  --  134*  --  135 137  K  --  3.6  --  3.8 3.7  CL  --  101  --  106 105  CO2  --  26  --  25 24  BUN  --  18  --  21* 20  CREATININE  --  0.76  --  0.76 0.76  CALCIUM  --  9.4  --  9.4 9.3  PROT 6.8  --  6.9  --  6.0*  BILITOT 2.2*  --  2.5*  --  1.5*  ALKPHOS 655*  --  783*  --  713*  ALT 109*  --  129*  --  128*  AST 153*  --  201*  --  193*  GLUCOSE  --  102*  --  105* 79   Imaging/Diagnostic Tests:  Dg Ercp Biliary & Pancreatic Ducts  Result Date: 04/30/2016 CLINICAL DATA:  Biliary Perez. EXAM: ERCP TECHNIQUE: Multiple spot images obtained with the fluoroscopic device and submitted for interpretation post-procedure.  COMPARISON:  CT of the abdomen on 04/28/2016 FINDINGS: Single image submitted demonstrates some contrast in what appears to be a dilated pancreatic duct. The common bile duct is not confidently visualized. IMPRESSION: ERCP images show contrast in what appears to be a dilated pancreatic duct. These images were submitted for radiologic interpretation only. Please see the procedural report for the amount of contrast and the fluoroscopy time utilized. Electronically Signed   By: Aletta Edouard M.Perez.   On: 04/30/2016 16:22      Carlyle Dolly, MD 05/01/2016, 9:09 AM PGY-2, [facility] Intern pager: [phone], text pages welcome

## 2016-05-01 NOTE — Sedation Documentation (Signed)
Patient denies pain and is resting comfortably.  

## 2016-05-01 NOTE — Procedures (Signed)
Interventional Radiology Procedure Note  Procedure:  PTC with Int/Ext drain across ampullary obstruction.   Findings: Ampullary obstruction.  Mild biliary dilation.  The drain crosses the cystic duct, and the GB should be drained.    Complications: None Recommendations:  - Catheter to drainage bag until capping trial.  - Nursing education for bag management.  - Do not submerge  - Routine care   Signed,  Dulcy Fanny. Earleen Newport, DO

## 2016-05-01 NOTE — Progress Notes (Signed)
Interval note  Patient notes mild pain at the site of his biliary drain. No fevers or chills.   We discussed what he knew about his condition. He's able to verbalize his bacteremia that has cleared and that there is a mass. He knows that he may benefit from resection vs radiation then resection.  I discussed that given a mass, we must be concerned about malignancy (especially in this patient with an elevated CA 19-9). Noted that I have touched base with the oncologist on call, [name].  The fact that he is asymptomatic is very fortunate. No evidence of liver mets on CT scan currently. We are awaiting mucosal biopsy results performed during ERCP.  [name] will touch base to make sure the patient is able to follow up with [name] with oncology. He specifically advised me to have the patient f/u with [name] to evaluate for possible resection. The patient was ok with this option, but noted he did not want to stay in the hospital too much longer. [name] is on call at [facility] and graciously stated that their office will contact the patient on Tuesday, 9/5, to schedule an afternoon appt on 9/6. I relayed all this to the patient who was appreciative. All questions were answered.  After talking with GI, as long as IR is ok and the biliary drains are working appropriately, the patient will possibly be discharged tomorrow, 9/3.  Shane Patten, MD [facility] Family Medicine Resident  05/01/2016, 4:41 PM

## 2016-05-02 ENCOUNTER — Encounter (HOSPITAL_COMMUNITY): Payer: Self-pay | Admitting: Gastroenterology

## 2016-05-02 LAB — CBC
HCT: 28.6 % — ABNORMAL LOW (ref 39.0–52.0)
Hemoglobin: 9.1 g/dL — ABNORMAL LOW (ref 13.0–17.0)
MCH: 29.5 pg (ref 26.0–34.0)
MCHC: 31.8 g/dL (ref 30.0–36.0)
MCV: 92.9 fL (ref 78.0–100.0)
PLATELETS: 263 10*3/uL (ref 150–400)
RBC: 3.08 MIL/uL — AB (ref 4.22–5.81)
RDW: 15 % (ref 11.5–15.5)
WBC: 6.8 10*3/uL (ref 4.0–10.5)

## 2016-05-02 LAB — COMPREHENSIVE METABOLIC PANEL
ALT: 142 U/L — ABNORMAL HIGH (ref 17–63)
AST: 191 U/L — ABNORMAL HIGH (ref 15–41)
Albumin: 2.1 g/dL — ABNORMAL LOW (ref 3.5–5.0)
Alkaline Phosphatase: 678 U/L — ABNORMAL HIGH (ref 38–126)
Anion gap: 5 (ref 5–15)
BUN: 19 mg/dL (ref 6–20)
CHLORIDE: 106 mmol/L (ref 101–111)
CO2: 25 mmol/L (ref 22–32)
Calcium: 9.2 mg/dL (ref 8.9–10.3)
Creatinine, Ser: 0.82 mg/dL (ref 0.61–1.24)
Glucose, Bld: 104 mg/dL — ABNORMAL HIGH (ref 65–99)
POTASSIUM: 4 mmol/L (ref 3.5–5.1)
SODIUM: 136 mmol/L (ref 135–145)
Total Bilirubin: 1 mg/dL (ref 0.3–1.2)
Total Protein: 6.2 g/dL — ABNORMAL LOW (ref 6.5–8.1)

## 2016-05-02 LAB — TROPONIN I
TROPONIN I: 0.03 ng/mL — AB (ref <0.03)
Troponin I: 0.03 ng/mL (ref <0.03)

## 2016-05-02 LAB — CULTURE, BLOOD (ROUTINE X 2)
CULTURE: NO GROWTH
CULTURE: NO GROWTH

## 2016-05-02 MED ORDER — SULFAMETHOXAZOLE-TRIMETHOPRIM 800-160 MG PO TABS: 1.0000 | ORAL_TABLET | Freq: Two times a day (BID) | ORAL | 0 refills | Status: DC

## 2016-05-02 MED ORDER — ISOSORBIDE MONONITRATE ER 30 MG PO TB24: 30.0000 mg | ORAL_TABLET | Freq: Every day | ORAL | 0 refills | Status: AC

## 2016-05-02 MED ORDER — SULFAMETHOXAZOLE-TRIMETHOPRIM 800-160 MG PO TABS: 1.0000 | ORAL_TABLET | Freq: Two times a day (BID) | ORAL | 0 refills | Status: AC

## 2016-05-02 MED ORDER — PRO-STAT SUGAR FREE PO LIQD: 30.0000 mL | Freq: Two times a day (BID) | ORAL | 0 refills | Status: AC

## 2016-05-02 MED ORDER — ENSURE ENLIVE PO LIQD: 237.0000 mL | Freq: Two times a day (BID) | ORAL | 12 refills | Status: DC

## 2016-05-02 MED ORDER — ENSURE ENLIVE PO LIQD: 237.0000 mL | Freq: Two times a day (BID) | ORAL | 12 refills | Status: AC

## 2016-05-02 MED ORDER — NITROGLYCERIN 0.4 MG SL SUBL: 0.4000 mg | SUBLINGUAL_TABLET | SUBLINGUAL | 3 refills | Status: DC | PRN

## 2016-05-02 MED ORDER — IPRATROPIUM BROMIDE 0.02 % IN SOLN: 0.5000 mg | Freq: Two times a day (BID) | RESPIRATORY_TRACT | 12 refills | Status: DC | PRN

## 2016-05-02 MED ORDER — TRAZODONE HCL 50 MG PO TABS: 50.0000 mg | ORAL_TABLET | Freq: Every day | ORAL | 1 refills | Status: DC

## 2016-05-02 MED ORDER — IPRATROPIUM BROMIDE 0.02 % IN SOLN: 0.5000 mg | Freq: Two times a day (BID) | RESPIRATORY_TRACT | 12 refills | Status: AC | PRN

## 2016-05-02 MED ORDER — PRO-STAT SUGAR FREE PO LIQD
30.0000 mL | Freq: Two times a day (BID) | ORAL | 0 refills | Status: DC
Start: 2016-05-02 — End: 2016-05-02

## 2016-05-02 MED ORDER — NITROGLYCERIN 0.4 MG SL SUBL: 0.4000 mg | SUBLINGUAL_TABLET | SUBLINGUAL | 3 refills | Status: AC | PRN

## 2016-05-02 MED ORDER — PREDNISONE 10 MG (21) PO TBPK: 10.0000 mg | ORAL_TABLET | Freq: Three times a day (TID) | ORAL | 0 refills | Status: DC

## 2016-05-02 MED ORDER — ISOSORBIDE MONONITRATE ER 30 MG PO TB24
30.0000 mg | ORAL_TABLET | Freq: Every day | ORAL | 0 refills | Status: DC
Start: 2016-05-02 — End: 2016-05-02

## 2016-05-02 NOTE — Progress Notes (Signed)
Referring Physician(s): Dr Owens Loffler  Supervising Physician: Corrie Mckusick  Patient Status:  Inpatient  Chief Complaint:  Biliary obstruction secondary to mass S/P Biliary drain by Dr. Earleen Newport 05/01/16  Subjective:  [name] is doing well this morning. No complaints.  Allergies: Review of patient's allergies indicates no known allergies.  Medications: Prior to Admission medications   Medication Sig Start Date End Date Taking? Authorizing Provider  acetaminophen (TYLENOL) 500 MG tablet Take 1,000 mg by mouth at bedtime.    Yes Historical Provider, MD  albuterol (PROVENTIL HFA;VENTOLIN HFA) 108 (90 Base) MCG/ACT inhaler Inhale 2 puffs into the lungs every 6 (six) hours as needed for wheezing or shortness of breath. 12/30/15  Yes Mercy Riding, MD  aspirin EC 81 MG EC tablet Take 1 tablet (81 mg total) by mouth daily. 04/12/15  Yes Luke K Kilroy, PA-C  atorvastatin (LIPITOR) 40 MG tablet Take 40 mg by mouth daily at 6 PM.   Yes Historical Provider, MD  budesonide-formoterol (SYMBICORT) 80-4.5 MCG/ACT inhaler Inhale 2 puffs into the lungs 2 (two) times daily. 09/12/12  Yes Tanda Rockers, MD  Ca Phosphate-Cholecalciferol (CALTRATE GUMMY BITES) 250-400 MG-UNIT CHEW Chew 2 tablets by mouth 2 (two) times daily.   Yes Historical Provider, MD  carbamazepine (TEGRETOL) 100 MG chewable tablet Chew 300 mg by mouth 2 (two) times daily.   Yes Historical Provider, MD  clopidogrel (PLAVIX) 75 MG tablet Take 1 tablet (75 mg total) by mouth daily. Patient taking differently: Take 75 mg by mouth every evening.  04/12/15  Yes Luke K Kilroy, PA-C  docusate sodium (COLACE) 100 MG capsule Take 100 mg by mouth every evening.    Yes Historical Provider, MD  finasteride (PROSCAR) 5 MG tablet Take 5 mg by mouth every morning.    Yes Historical Provider, MD  furosemide (LASIX) 40 MG tablet Take 40 mg by mouth every morning.   Yes Historical Provider, MD  ipratropium (ATROVENT HFA) 17 MCG/ACT inhaler Inhale  2 puffs into the lungs 2 (two) times daily as needed for wheezing.    Yes Historical Provider, MD  isosorbide mononitrate (IMDUR) 30 MG 24 hr tablet Take 90 mg by mouth daily.   Yes Historical Provider, MD  Melatonin 3 MG TABS Take 3 mg by mouth at bedtime.   Yes Historical Provider, MD  metoprolol succinate (TOPROL-XL) 12.5 mg TB24 24 hr tablet Take 12.5 mg by mouth daily.   Yes Historical Provider, MD  Multiple Vitamins-Minerals (MULTIVITAMIN WITH MINERALS) tablet Take 1 tablet by mouth every evening.    Yes Historical Provider, MD  NITROSTAT 0.4 MG SL tablet PLACE 1 TABLET UNDER THE TONGUE EVERY 5 MINUTES ASDIRECTED BY PHYSICIAN 01/07/15  Yes Darlin Coco, MD  potassium chloride SA (K-DUR,KLOR-CON) 20 MEQ tablet Take 20 mEq by mouth daily.    Yes Historical Provider, MD  predniSONE (DELTASONE) 20 MG tablet Take 3 tablets (60 mg total) by mouth daily with breakfast. 04/05/16  Yes Archie Patten, MD  ranitidine (ZANTAC) 150 MG tablet TAKE 1 TABLET BY MOUTH 2 TIMES DAILY. Patient taking differently: Take 150 mg by mouth at bedtime 10/29/15  Yes Darlin Coco, MD  senna (SENOKOT) 8.6 MG tablet Take 1 tablet by mouth daily.    Yes Historical Provider, MD  sertraline (ZOLOFT) 100 MG tablet Take 150 mg by mouth every morning.    Yes Historical Provider, MD  tamsulosin (FLOMAX) 0.4 MG CAPS capsule Take 1 capsule (0.4 mg total) by mouth daily. 09/06/14  Yes Gallatin N Rumley, DO  predniSONE (STERAPRED UNI-PAK 21 TAB) 10 MG (21) TBPK tablet Take as directed. Patient not taking: Reported on 04/27/2016 01/04/16   Janora Norlander, DO     Vital Signs: BP (!) 141/69 (BP Location: Right Arm)   Pulse (!) 59   Temp 97.7 F (36.5 C) (Oral)   Resp 18   Ht 5\' 10"  (1.778 m)   Wt 151 lb 3.2 oz (68.6 kg) Comment: scale c  SpO2 98%   BMI 21.69 kg/m   Physical Exam  Awake and alert Tolerating diet Abdomen soft, NT Biliary drain in place, looks good Bilious ouput ~820 ml  Imaging: Ct Abdomen Pelvis W  Contrast  Result Date: 04/28/2016 CLINICAL DATA:  Epigastric pain EXAM: CT ABDOMEN AND PELVIS WITH CONTRAST TECHNIQUE: Multidetector CT imaging of the abdomen and pelvis was performed using the standard protocol following bolus administration of intravenous contrast. CONTRAST:  15mL ISOVUE-300 IOPAMIDOL (ISOVUE-300) INJECTION 61%, 44mL ISOVUE-300 IOPAMIDOL (ISOVUE-300) INJECTION 61% COMPARISON:  02/28/2013 FINDINGS: Dependent atelectasis at the lung bases. Marked intra and extrahepatic biliary dilatation is present. There is a soft tissue filling defect in the distal common bile duct. Cholangiocarcinoma is not excluded. Pancreatic ducts are also very dilated. Tiny hypodensity in the right lobe of liver on image 30 is stable There is gallbladder wall thickening. The gallbladder is mildly distended. This is likely related to biliary obstruction. Spleen is unremarkable Adrenal glands are within normal limits Several benign appearing renal cysts are stable. Sigmoid diverticulosis. No evidence of acute diverticulitis. No obvious mass in the colon. Prominent stool burden within the colon. No evidence of small-bowel obstruction Bladder is unremarkable.  Postoperative changes in the prostate. Atherosclerotic and iliac artery atherosclerotic calcifications. No abnormal retroperitoneal adenopathy Anterolisthesis L5 upon S1. Postoperative changes at this level. These findings are stable. No vertebral compression deformity. IMPRESSION: Findings above are worrisome for biliary obstruction secondary to a mass at the distal common bile duct. Correlation with bilirubin levels is recommended. Gallbladder wall thickening is likely related to biliary obstruction. Electronically Signed   By: Marybelle Killings M.D.   On: 04/28/2016 22:15   Dg Ercp Biliary & Pancreatic Ducts  Result Date: 04/30/2016 CLINICAL DATA:  Biliary obstruction. EXAM: ERCP TECHNIQUE: Multiple spot images obtained with the fluoroscopic device and submitted for  interpretation post-procedure. COMPARISON:  CT of the abdomen on 04/28/2016 FINDINGS: Single image submitted demonstrates some contrast in what appears to be a dilated pancreatic duct. The common bile duct is not confidently visualized. IMPRESSION: ERCP images show contrast in what appears to be a dilated pancreatic duct. These images were submitted for radiologic interpretation only. Please see the procedural report for the amount of contrast and the fluoroscopy time utilized. Electronically Signed   By: Aletta Edouard M.D.   On: 04/30/2016 16:22   Ir Int Lianne Cure Biliary Drain With Cholangiogram  Result Date: 05/01/2016 INDICATION: 82 year old male with a history of bile duct obstruction, with ampullary mass identified and biopsied on ERCP 04/30/2016 EXAM: IR INT-EXT BILIARY DRAIN W/ CHOLANGIOGRAM MEDICATIONS: Rocephin scheduled ANESTHESIA/SEDATION: Moderate (conscious) sedation was employed during this procedure. A total of Versed 0.5 mg and Fentanyl 25 mcg was administered intravenously. Moderate Sedation Time: 15 minutes. The patient's level of consciousness and vital signs were monitored continuously by radiology nursing throughout the procedure under my direct supervision. FLUOROSCOPY TIME:  Fluoroscopy Time: 4 minutes 24 seconds (43 mGy). COMPLICATIONS: None PROCEDURE: The procedure, risks, benefits, and alternatives were explained to the patient and the patient's  family. A complete informed consent was performed, with risk benefit analysis. Specific risks that were discussed for the procedure include bleeding, infection, biliary sepsis, IC use day, organ injury, need for further procedure, need for further surgery, long-term drain placement, cardiopulmonary collapse, death. Questions regarding the procedure were encouraged and answered. The patient understands and consents to the procedure. Patient is position in supine position on the fluoroscopy table, and the upper abdomen was prepped and draped in the  usual sterile fashion. Maximum barrier sterile technique with sterile gowns and gloves were used for the procedure. A timeout was performed prior to the initiation of the procedure. Local anesthesia was provided with 1% lidocaine with epinephrine. Ultrasound survey of the left liver lobe was performed, with then ultrasound of the right liver lobe. Window into the right biliary system was only present for the right liver approach. 1% lidocaine was used for local anesthesia, with generous infiltration of the skin and subcutaneous tissues in an intercostal location. A Chiba needle was advanced under fluoroscopic guidance into the right liver lobe. Once the tip of the needle was confirmed within the biliary system by injecting small aliquots of contrast, images were stored of the biliary system after partially opacifying the biliary tree via the needle. Primary access was used for the drain placement. Once the tip of this needle was confirmed within the biliary system, an 018 wire was advanced into the left-sided ducts. The needle was removed, a small incision was made with an 11 blade scalpel, and then a triaxial Accustick system was advanced into the biliary system. The metal stiffener and dilator were removed, we confirmed placement with contrast infusion. In order to opacified the extrahepatic biliary system, the stiffener and knee inner dilator were removed from the Accustick system over the 018 wire, and a hemostatic valve was placed at the hub of the 4 [place] sheath. Contrast injection opacified the common hepatic duct, common bile duct, and the cystic duct as well as partial opacification of left-sided biliary ducts. A sample of bile was aspirated to sent to the lab. The micro wire was removed and a Glidewire was navigated into the extrahepatic biliary ducts. Accustick system was removed from the Newport, and 10 French soft tissue dilation was performed on the Glidewire. Kumpe the catheter was then passed over  the Glidewire into the common bile duct. The 5 French Kumpe catheter and Glidewire were used to navigate across the obstruction and the ampulla. Wire was removed and contrast injection confirmed location of the catheter within the duodenum. A Bentson wire was placed, Kumpe the catheter was removed, and then a 10 [place] biliary drain was placed as an internal/external biliary drain. Small amount of contrast confirmed location. The patient tolerated the procedure well and remained hemodynamically stable throughout. No complications were encountered and no significant blood loss was encountered. FINDINGS: Mild dilation of the right and left-sided biliary ducts. Internal/external biliary 10 French drain placed via the right-sided ducts. Ampullary obstruction confirmed. The side holes of the biliary drain are in adequate position to drain the gallbladder, left-sided ducts, and right-sided ducts. IMPRESSION: Status post percutaneous transhepatic cholangiogram with placement of an internal/external biliary drain across ampullary occlusion. Gravity drain was attached to the drainage catheter. Sample of bile was sent to the lab for analysis. Signed, Dulcy Fanny. Earleen Newport, DO [facility] Rad[facility] Electronically Signed   By: Corrie Mckusick D.O.   On: 05/01/2016 13:48    Labs:  CBC:  Recent Labs  04/29/16  0158 04/30/16 0242 05/01/16 0221 05/02/16 0158  WBC 8.5 8.1 7.1 6.8  HGB 7.8* 9.9* 9.1* 9.1*  HCT 24.5* 30.9* 28.7* 28.6*  PLT 268 256 246 263    COAGS:  Recent Labs  05/01/16 0221  INR 1.18    BMP:  Recent Labs  04/29/16 0158 04/30/16 0242 05/01/16 0221 05/02/16 0158  NA 134* 135 137 136  K 3.6 3.8 3.7 4.0  CL 101 106 105 106  CO2 26 25 24 25   GLUCOSE 102* 105* 79 104*  BUN 18 21* 20 19  CALCIUM 9.4 9.4 9.3 9.2  CREATININE 0.76 0.76 0.76 0.82  GFRNONAA >60 >60 >60 >60  GFRAA >60 >60 >60 >60    LIVER FUNCTION TESTS:  Recent Labs   04/28/16 1014 04/29/16 0802 05/01/16 0221 05/02/16 0158  BILITOT 2.2* 2.5* 1.5* 1.0  AST 153* 201* 193* 191*  ALT 109* 129* 128* 142*  ALKPHOS 655* 783* 713* 678*  PROT 6.8 6.9 6.0* 6.2*  ALBUMIN 2.3* 2.3* 2.0* 2.1*    Assessment and Plan:  Biliary obstruction secondary to mass  S/P Biliary drain by Dr. Earleen Newport 05/01/16  Continue drain to gravity bag  Will follow  Electronically Signed: Murrell Redden PA-C 05/02/2016, 9:14 AM   I spent a total of 15 Minutes at the the patient's bedside AND on the patient's hospital floor or unit, greater than 50% of which was counseling/coordinating care for f/u after biliary drain.

## 2016-05-02 NOTE — Care Management Note (Signed)
Case Management Note  Patient Details  Name: [name] MRN: [id number] Date of Birth: 06/15/1925  Subjective/Objective:                  bacteremia Action/Plan: Discharge planning Expected Discharge Date:                  Expected Discharge Plan:  [place]  In-House Referral:     Discharge planning Services  CM Consult  Post Acute Care Choice:  Home Health Choice offered to:  Patient  DME Arranged:  N/A DME Agency:  NA  HH Arranged:  PT, RN [place] Agency:  [place]  Status of Service:  Completed, signed off  If discussed at [place] of Stay Meetings, dates discussed:    Additional Comments: CM met with family to offer choice of home health agency.  Pt chooses [facility] to render HHPT/RN.  Referral called to [facility] rep, Santiago Glad. Pt has rolling walker at home and denies need for additional DMe.   Pt asked if [facility] pharmacy was open and CM called to find it is NOT.  Pt requested prescription be faxed to [facility] on [address].  CM faxed prescription to [phone].  No other CM needs were  communicated. Dellie Catholic, RN 05/02/2016, 2:24 PM

## 2016-05-02 NOTE — Progress Notes (Signed)
At 1515 all d/c instructions explained and given to pt.  Verbalized understanding.  D/c off floor via w/c to awaiting transport.  Hershel Corkery, Therapist, sports.

## 2016-05-02 NOTE — Progress Notes (Signed)
Family Medicine Teaching Service Daily Progress Note Intern Pager: [phone]  Patient name: [name] Medical record number: [id number] Date of birth: Feb 24, 1925 Age: 82 y.o. Gender: male  Primary Care Provider: Bufford Lope, DO Consultants: Cardiology Code Status: FULL  Pt Overview and Major Events to Date:  8/29: Admit to FPTS 8/31: D/C Cefepime  9/1: ERCP showing suspicious periampullary neoplastic mass  9/2: IR placed biliary drain  Assessment and Plan: [name] is a 82 y.o. male found to have bacteremia with fatigue, new dyspnea on exertion, new murmur. PMH is significant for HTN, CAD s/p CABG 1986 with redo 1993, PTCA 2008, and DES 2010, hx MI, LBBB, HFrEF (but recent ECHO 08/2014 with normal EF 55-60%, G1DD), Asthma/COPD, GERD, OA, Osteoporosis, Dyslipidemia, Peripheral Neuropathy, BPH with history or urinary retention, Pacemaker for tachy brady syndrome (03/2015), History of atrial flutter (no anticoagulation due to falls and anemia)   #Klebsiella Bacteremia: 2/2 blood cultures positive for gram negative rods resulted in less than 24hrs, one bottle of his bcx from cardiologist office grew Klebsiella (not able to follow these cxs in EPIC). Cards office following. Patient with pacemaker which puts him at a higher risk for endocarditis,TEE 8/30 showed no vegetations. Per cards recs, if no vegetations on pacemaker leads, will not plan extraction. Lactic acid 1.3 normal on 8/31. WBC nml. Has remained afebrile since admission..  - Per ID recs, continue IV CTX (8/31-  ). Will need 14 days total of abx (stop date on 9/11). Can transition to PO Bactrim per ID if discharged prior to 9/11.  - Repeat Blood and urine cx on 8/31 NGTD, continue to follow - Monitor for fever  #Mass at distal common bile duct causing biliary obstruction: CT scan worrisome for biliary obstruction secondary to a mass at the distal common bile duct. Direct bili high at 1.5. Total bili elevated as well 2.2.  Gallbladder wall thickening is likely related to biliary obstruction. ERCP on 9/1 showed suspected neoplastic periampullary mass. Marker CA 19-9 elevated at 91 which is concerning for pancreatic cancer.  - IR placed biliary drain 9/2, will seek input on discharge today - Oncology to see as an outpt, see Dr. Libby Maw note  #Low Hgb. 9.9>9.1>9.1 -Daily CBCs -follow hgb, transfusion threshold <8  #Intermittent Chest Pain with H/o CAD s/p stent placement: Stable. Reports he has similar symptoms at baseline but has worsened during the past few week. No chest pain currently. EKG showing sinus rhythm with LBBB but stable and unchanged from previous in May 2017. Troponins stable at 0.05.  - Continue to monitor.  - ASA and Plavix - continue Lipitor  #Elevated LFTs. Continue to be elevated -GI consulted, appreciate recs - Daily CMP  #Chronic Steroid Use: Started on daily prednisone on August 7th for concern for temporal arteritis given Sed rate 41. Biopsy was negative. Started Prednisone taper on  8/29; 40mg x3days, 20mg x3days,10mg x3days. - Continue steroid taper, now on day 2/3 of 20 mg dosing  #Hypertension: Normotensive this AM  -Continue Imdur and metoprolol   #Hx of Atrial flutter: not on anticoagulation due to falls and anemia - metoprolol   -Cardiology following   #Systolic CHF: Last echocardiogram January 2016 showed normal LV systolic function,Grade 1 diastolic dysfunction, moderate left atrial enlargement and trace mitral regurgitation. Nuclear study January 2016 showed ejection fraction 58%. Clinically mildly dry. On Lasix 40mg  daily and Kdur at home. - Holding Lasix for now  - daily weights  #Constipation: Resolved  - Continue miralax   #Depression: -  continue Zoloft   #HLD: - continue Lipitor  #Asthma/COPD: -continue Symbicort and albuterol PRN  #Peripheral Neuropathy:  - continue Tegretol   #BPH with history of urinary retention:   - cont finasteride and  Flomax   #Osteoporosis:  - Calcium-Vit D   FEN/GI: heart healthy Prophylaxis: Lovenox   Disposition: possibly home today  Subjective:  Patient has no complaints today. Would love to go home today. Understands that he is to see Onc and GI as outpatient next week.  Objective: Temp:  [97.7 F (36.5 C)-98.2 F (36.8 C)] 97.7 F (36.5 C) (09/03 0539) Pulse Rate:  [59-65] 59 (09/03 0539) Resp:  [13-20] 18 (09/03 0539) BP: (112-156)/(49-73) 141/69 (09/03 0539) SpO2:  [93 %-98 %] 98 % (09/03 0539) Weight:  [151 lb 3.2 oz (68.6 kg)] 151 lb 3.2 oz (68.6 kg) (09/03 0539) Physical Exam: Gen: pleasant elderly male, NAD Cardiac: RRR, 2/6 systolic murmur, no heaves/thrills Resp: CTAB, normal effort on RA Abd: soft, +bs, no tenderness Ext: no edema noted  Laboratory:  Recent Labs Lab 04/30/16 0242 05/01/16 0221 05/02/16 0158  WBC 8.1 7.1 6.8  HGB 9.9* 9.1* 9.1*  HCT 30.9* 28.7* 28.6*  PLT 256 246 263    Recent Labs Lab 04/29/16 0802 04/30/16 0242 05/01/16 0221 05/02/16 0158  NA  --  135 137 136  K  --  3.8 3.7 4.0  CL  --  106 105 106  CO2  --  25 24 25   BUN  --  21* 20 19  CREATININE  --  0.76 0.76 0.82  CALCIUM  --  9.4 9.3 9.2  PROT 6.9  --  6.0* 6.2*  BILITOT 2.5*  --  1.5* 1.0  ALKPHOS 783*  --  713* 678*  ALT 129*  --  128* 142*  AST 201*  --  193* 191*  GLUCOSE  --  105* 79 104*   Imaging/Diagnostic Tests:  Ir Int Lianne Cure Biliary Drain With Cholangiogram  Result Date: 05/01/2016 INDICATION: 82 year old male with a history of bile duct obstruction, with ampullary mass identified and biopsied on ERCP 04/30/2016 EXAM: IR INT-EXT BILIARY DRAIN W/ CHOLANGIOGRAM MEDICATIONS: Rocephin scheduled ANESTHESIA/SEDATION: Moderate (conscious) sedation was employed during this procedure. A total of Versed 0.5 mg and Fentanyl 25 mcg was administered intravenously. Moderate Sedation Time: 15 minutes. The patient's level of consciousness and vital signs were monitored continuously  by radiology nursing throughout the procedure under my direct supervision. FLUOROSCOPY TIME:  Fluoroscopy Time: 4 minutes 24 seconds (43 mGy). COMPLICATIONS: None PROCEDURE: The procedure, risks, benefits, and alternatives were explained to the patient and the patient's family. A complete informed consent was performed, with risk benefit analysis. Specific risks that were discussed for the procedure include bleeding, infection, biliary sepsis, IC use day, organ injury, need for further procedure, need for further surgery, long-term drain placement, cardiopulmonary collapse, death. Questions regarding the procedure were encouraged and answered. The patient understands and consents to the procedure. Patient is position in supine position on the fluoroscopy table, and the upper abdomen was prepped and draped in the usual sterile fashion. Maximum barrier sterile technique with sterile gowns and gloves were used for the procedure. A timeout was performed prior to the initiation of the procedure. Local anesthesia was provided with 1% lidocaine with epinephrine. Ultrasound survey of the left liver lobe was performed, with then ultrasound of the right liver lobe. Window into the right biliary system was only present for the right liver approach. 1% lidocaine was used for local anesthesia,  with generous infiltration of the skin and subcutaneous tissues in an intercostal location. A Chiba needle was advanced under fluoroscopic guidance into the right liver lobe. Once the tip of the needle was confirmed within the biliary system by injecting small aliquots of contrast, images were stored of the biliary system after partially opacifying the biliary tree via the needle. Primary access was used for the drain placement. Once the tip of this needle was confirmed within the biliary system, an 018 wire was advanced into the left-sided ducts. The needle was removed, a small incision was made with an 11 blade scalpel, and then a  triaxial Accustick system was advanced into the biliary system. The metal stiffener and dilator were removed, we confirmed placement with contrast infusion. In order to opacified the extrahepatic biliary system, the stiffener and knee inner dilator were removed from the Accustick system over the 018 wire, and a hemostatic valve was placed at the hub of the 4 [place] sheath. Contrast injection opacified the common hepatic duct, common bile duct, and the cystic duct as well as partial opacification of left-sided biliary ducts. A sample of bile was aspirated to sent to the lab. The micro wire was removed and a Glidewire was navigated into the extrahepatic biliary ducts. Accustick system was removed from the [place], and 10 French soft tissue dilation was performed on the Glidewire. Kumpe the catheter was then passed over the Glidewire into the common bile duct. The 5 French Kumpe catheter and Glidewire were used to navigate across the obstruction and the ampulla. Wire was removed and contrast injection confirmed location of the catheter within the duodenum. A Bentson wire was placed, Kumpe the catheter was removed, and then a 10 [place] biliary drain was placed as an internal/external biliary drain. Small amount of contrast confirmed location. The patient tolerated the procedure well and remained hemodynamically stable throughout. No complications were encountered and no significant blood loss was encountered. FINDINGS: Mild dilation of the right and left-sided biliary ducts. Internal/external biliary 10 French drain placed via the right-sided ducts. Ampullary obstruction confirmed. The side holes of the biliary drain are in adequate position to drain the gallbladder, left-sided ducts, and right-sided ducts. IMPRESSION: Status post percutaneous transhepatic cholangiogram with placement of an internal/external biliary drain across ampullary occlusion. Gravity drain was attached to the drainage catheter. Sample of bile  was sent to the lab for analysis. Signed, Dulcy Fanny. Earleen Newport, DO [facility] Radiology Electronically Signed   By: Corrie Mckusick D.O.   On: 05/01/2016 13:48    Sela Hilding, MD 05/02/2016, 10:20 AM PGY-1, [place] Intern pager: [phone], text pages welcome

## 2016-05-02 NOTE — Discharge Instructions (Signed)
Dr. Rosario Jacks office with [facility] will call you on Tuesday about setting up an appointment on Wednesday afternoon. Even if your mass isn't cancer, it still needs to be 1) removed or 2) decreased in size with something like radiation so that you do not become symptomatic from it.  Dr. Gearldine Shown office, the oncologist, will contact you as well.  For your antibiotic - take the bactrim twice a day through 9/11.   For the prednisone (steroid taper), take 20mg  tonight 9/3. Take 10mg  3 times per day 9/4-9/6. Take 5 mg (half a tab) 3 times per day 9/7-9/9.

## 2016-05-02 NOTE — Progress Notes (Signed)
[facility] ROUNDING NOTE   Subjective: Feels the same. Anxious about the diagnosis.    Objective: Vital signs in last 24 hours: Temp:  [97.7 F (36.5 C)-98.2 F (36.8 C)] 97.7 F (36.5 C) (09/03 0539) Pulse Rate:  [59-74] 74 (09/03 1059) Resp:  [13-20] 18 (09/03 0539) BP: (110-156)/(49-73) 110/72 (09/03 1059) SpO2:  [93 %-98 %] 98 % (09/03 0539) Weight:  [151 lb 3.2 oz (68.6 kg)] 151 lb 3.2 oz (68.6 kg) (09/03 0539) Last BM Date: 04/30/16 Vital signs in last 24 hours: Temp:  [97.7 F (36.5 C)-98.2 F (36.8 C)] 97.7 F (36.5 C) (09/03 0539) Pulse Rate:  [59-74] 74 (09/03 1059) Resp:  [13-20] 18 (09/03 0539) BP: (110-156)/(49-73) 110/72 (09/03 1059) SpO2:  [93 %-98 %] 98 % (09/03 0539) Weight:  [151 lb 3.2 oz (68.6 kg)] 151 lb 3.2 oz (68.6 kg) (09/03 0539) Last BM Date: 04/30/16  General:  Well-developed, well-nourished and in no acute distress Eyes:  anicteric. Abdomen:  soft, non-tender, no hepatosplenomegaly, hernia, or mass and BS+. Drain with                                    ~150cc  bilious fluid Extremities:   no edema Skin   no rash. Neuro:              A&O x 3.  Psych:  appropriate mood and  Affect.   Intake/Output from previous day: 09/02 0701 - 09/03 0700 In: 480 [P.O.:480] Out: 1720 [Urine:900; Drains:820] Intake/Output this shift: Total I/O In: -  Out: 200 [Drains:200]   Lab Results:  Recent Labs  04/30/16 0242 05/01/16 0221 05/02/16 0158  WBC 8.1 7.1 6.8  HGB 9.9* 9.1* 9.1*  PLT 256 246 263  MCV 92.0 92.3 92.9   BMET  Recent Labs  04/30/16 0242 05/01/16 0221 05/02/16 0158  NA 135 137 136  K 3.8 3.7 4.0  CL 106 105 106  CO2 25 24 25   GLUCOSE 105* 79 104*  BUN 21* 20 19  CREATININE 0.76 0.76 0.82  CALCIUM 9.4 9.3 9.2   LFT  Recent Labs  05/01/16 0221 05/02/16 0158  PROT 6.0* 6.2*  ALBUMIN 2.0* 2.1*  AST 193* 191*  ALT 128* 142*  ALKPHOS 713* 678*  BILITOT 1.5* 1.0   PT/INR  Recent Labs   05/01/16 0221  INR 1.18      Imaging/Other results: Ir Int Lianne Cure Biliary Drain With Cholangiogram  Result Date: 05/01/2016 INDICATION: 82 year old male with a history of bile duct obstruction, with ampullary mass identified and biopsied on ERCP 04/30/2016 EXAM: IR INT-EXT BILIARY DRAIN W/ CHOLANGIOGRAM MEDICATIONS: Rocephin scheduled ANESTHESIA/SEDATION: Moderate (conscious) sedation was employed during this procedure. A total of Versed 0.5 mg and Fentanyl 25 mcg was administered intravenously. Moderate Sedation Time: 15 minutes. The patient's level of consciousness and vital signs were monitored continuously by radiology nursing throughout the procedure under my direct supervision. FLUOROSCOPY TIME:  Fluoroscopy Time: 4 minutes 24 seconds (43 mGy). COMPLICATIONS: None PROCEDURE: The procedure, risks, benefits, and alternatives were explained to the patient and the patient's family. A complete informed consent was performed, with risk benefit analysis. Specific risks that were discussed for the procedure include bleeding, infection, biliary sepsis, IC use day, organ injury, need for further procedure, need for further surgery, long-term drain placement, cardiopulmonary collapse, death. Questions regarding the procedure were encouraged and answered. The patient understands and consents to the procedure.  Patient is position in supine position on the fluoroscopy table, and the upper abdomen was prepped and draped in the usual sterile fashion. Maximum barrier sterile technique with sterile gowns and gloves were used for the procedure. A timeout was performed prior to the initiation of the procedure. Local anesthesia was provided with 1% lidocaine with epinephrine. Ultrasound survey of the left liver lobe was performed, with then ultrasound of the right liver lobe. Window into the right biliary system was only present for the right liver approach. 1% lidocaine was used for local anesthesia, with generous  infiltration of the skin and subcutaneous tissues in an intercostal location. A Chiba needle was advanced under fluoroscopic guidance into the right liver lobe. Once the tip of the needle was confirmed within the biliary system by injecting small aliquots of contrast, images were stored of the biliary system after partially opacifying the biliary tree via the needle. Primary access was used for the drain placement. Once the tip of this needle was confirmed within the biliary system, an 018 wire was advanced into the left-sided ducts. The needle was removed, a small incision was made with an 11 blade scalpel, and then a triaxial Accustick system was advanced into the biliary system. The metal stiffener and dilator were removed, we confirmed placement with contrast infusion. In order to opacified the extrahepatic biliary system, the stiffener and knee inner dilator were removed from the Accustick system over the 018 wire, and a hemostatic valve was placed at the hub of the 4 [place] sheath. Contrast injection opacified the common hepatic duct, common bile duct, and the cystic duct as well as partial opacification of left-sided biliary ducts. A sample of bile was aspirated to sent to the lab. The micro wire was removed and a Glidewire was navigated into the extrahepatic biliary ducts. Accustick system was removed from the [place], and 10 French soft tissue dilation was performed on the Glidewire. Kumpe the catheter was then passed over the Glidewire into the common bile duct. The 5 French Kumpe catheter and Glidewire were used to navigate across the obstruction and the ampulla. Wire was removed and contrast injection confirmed location of the catheter within the duodenum. A Bentson wire was placed, Kumpe the catheter was removed, and then a 10 [place] biliary drain was placed as an internal/external biliary drain. Small amount of contrast confirmed location. The patient tolerated the procedure well and remained  hemodynamically stable throughout. No complications were encountered and no significant blood loss was encountered. FINDINGS: Mild dilation of the right and left-sided biliary ducts. Internal/external biliary 10 French drain placed via the right-sided ducts. Ampullary obstruction confirmed. The side holes of the biliary drain are in adequate position to drain the gallbladder, left-sided ducts, and right-sided ducts. IMPRESSION: Status post percutaneous transhepatic cholangiogram with placement of an internal/external biliary drain across ampullary occlusion. Gravity drain was attached to the drainage catheter. Sample of bile was sent to the lab for analysis. Signed, Dulcy Fanny. Earleen Newport, DO [facility] Radiology Electronically Signed   By: Corrie Mckusick D.O.   On: 05/01/2016 13:48      Assessment & Plan  40 yr M admitted with bacteremia, CBD obstruction secondary to periampullary mass, presentation concerning for possible cholangitis. S/p ERCP 04/30/16 with periampullary mucosal changes concerning for neoplasm (Adenoma vs malignancy), unsuccessful to cannulate CBD.  Successful internal external drain placement for biliary decompression by IR Bili and Alk phos trending down Patient is clinically stable with no fever or leucocytosis Complete  course of antibiotics for bacteremia Await pathology report from periampullary biopsies He has follow up with Surgery, Onc and IR Ok to discharge from [place] , [place] [phone] Mon-Fri 8a-5p [phone] after 5p, weekends, holidays [facility]

## 2016-05-02 NOTE — Evaluation (Signed)
Physical Therapy Evaluation Patient Details Name: [name] MRN: [id number] DOB: [birth year] Today's Date: 05/02/2016   History of Present Illness  66 yr admitted with bacteremia, CBD obstruction secondary to periampullary mass, presentation concerning for possible cholangitis. S/p ERCP 9/1/17with periampullary mucosal changes concerning for neoplasm (Adenoma vs malignancy), unsuccessful to cannulate CBD.   PMH significant for CAD, CABG 1986 and 1993, PTCA 2008, DES 2010.  STEMI 06/2012.  CHF.  A flutter.  On DAP with Plavix, ASA.  S/p pacemaker for tachy/brady.   COPD asthma.  Depression.  Ureteral stones and stent placement 2014.  CSF leak after discectomy.  Hx of hepatitis in 1970s, sounds like Hep A.  No liver disease.  S/p TURP for prostate cancer, remotely.   H/O foot drop, uses AFO at home.    Clinical Impression  Patient presents essentially at baseline, with slightly decreased endurance, fatigue with gait.  Patient with significant history of falls, most likely due to left foot drop (reports he does not always wear AFO).  No apparent balance deficits noted today.  Feel patient safe to discharge home with wife from PT standpoint.      Follow Up Recommendations No PT follow up    Equipment Recommendations  None recommended by PT    Recommendations for Other Services       Precautions / Restrictions Precautions Precautions: Fall Required Braces or Orthoses: Other Brace/Splint Other Brace/Splint: uses AFO at home, not present in room.      Mobility  Bed Mobility Overal bed mobility: Independent                Transfers Overall transfer level: Modified independent                  Ambulation/Gait Ambulation/Gait assistance: Supervision Ambulation Distance (Feet): 100 Feet Assistive device: Rolling walker (2 wheeled) Gait Pattern/deviations: Step-through pattern;Steppage Gait velocity: decreased   General Gait Details: steppage left foot due to foot  drop  Stairs            Wheelchair Mobility    Modified Rankin (Stroke Patients Only)       Balance Overall balance assessment: Needs assistance Sitting-balance support: No upper extremity supported Sitting balance-Leahy Scale: Good     Standing balance support: Bilateral upper extremity supported;During functional activity Standing balance-Leahy Scale: Poor Standing balance comment: reliant on RW for balance                             Pertinent Vitals/Pain Pain Assessment: No/denies pain    Home Living Family/patient expects to be discharged to:: Private residence Living Arrangements: Spouse/significant other Available Help at Discharge: Family;Available 24 hours/day Type of Home: House Home Access: Stairs to enter Entrance Stairs-Rails: Left Entrance Stairs-Number of Steps: 2 Home Layout: One level        Prior Function Level of Independence: Independent with assistive device(s)         Comments: uses RW in home, rollator when out     Hand Dominance        Extremity/Trunk Assessment   Upper Extremity Assessment: Generalized weakness           Lower Extremity Assessment: LLE deficits/detail   LLE Deficits / Details: left foot drop     Communication   Communication: No difficulties  Cognition Arousal/Alertness: Awake/alert Behavior During Therapy: WFL for tasks assessed/performed Overall Cognitive Status: Within Functional Limits for tasks assessed  General Comments      Exercises        Assessment/Plan    PT Assessment [place] does not need any further PT services  PT Diagnosis Generalized weakness   PT Problem List    PT Treatment Interventions     PT Goals (Current goals can be found in the Care Plan section) Acute Rehab PT Goals PT Goal Formulation: All assessment and education complete, DC therapy    Frequency     Barriers to discharge        Co-evaluation                End of Session   Activity Tolerance: Patient tolerated treatment well;No increased pain Patient left: in bed;with call bell/phone within reach;with family/visitor present Nurse Communication: Mobility status         Time: 1150-1201 PT Time Calculation (min) (ACUTE ONLY): 11 min   Charges:   PT Evaluation $PT Eval Low Complexity: 1 Procedure     PT G CodesShanna Cisco 05/02/2016, 12:07 PM  05/02/2016 [place], [place]

## 2016-05-03 NOTE — Progress Notes (Addendum)
Here for hospital discharge follow up for the following conditions:  Bacteremia: He is compliant with his Bactrim. Denies any fever, doing well otherwise. Common Bile duct mass: He endorsed stomach pain with cough or sneezing only. Occasional diarrhea alternating with constipation. He had full work up and stent placement at the hospital.Patient stated his biliary drain is draining a lot of fluid. He has [facility]HA come check on his dressing often. He mentioned he was sent home with ensure prescription as well as Pro Stat. He was not able to get this dietary supplement at [facility] and was wondering where else he could get it. He will like to know the result of his biopsy. Anemia: No acute bleeding, he stated he had blood transfusion while he was in the hospital. He feels weak and somewhat tired. No other concern. Headache: Denies today. Still tapering off his steroid.  Current Outpatient Prescriptions on File Prior to Visit  Medication Sig Dispense Refill  . budesonide-formoterol (SYMBICORT) 80-4.5 MCG/ACT inhaler Inhale 2 puffs into the lungs 2 (two) times daily.    . carbamazepine (TEGRETOL) 100 MG chewable tablet Chew 300 mg by mouth 2 (two) times daily.    [name] docusate sodium (COLACE) 100 MG capsule Take 100 mg by mouth every evening.     . feeding supplement, ENSURE ENLIVE, (ENSURE ENLIVE) LIQD Take 237 mLs by mouth 2 (two) times daily between meals. 237 mL 12  . finasteride (PROSCAR) 5 MG tablet Take 5 mg by mouth every morning.     [name] ipratropium (ATROVENT HFA) 17 MCG/ACT inhaler Inhale 2 puffs into the lungs 2 (two) times daily as needed for wheezing.     [name] ipratropium (ATROVENT) 0.02 % nebulizer solution Take 2.5 mLs (0.5 mg total) by nebulization 2 (two) times daily as needed for wheezing or shortness of breath. 75 mL 12  . isosorbide mononitrate (IMDUR) 30 MG 24 hr tablet Take 1 tablet (30 mg total) by mouth daily. 30 tablet 0  . metoprolol succinate (TOPROL-XL) 12.5 mg TB24 24 hr tablet Take 12.5 mg  by mouth daily.    . Multiple Vitamins-Minerals (MULTIVITAMIN WITH MINERALS) tablet Take 1 tablet by mouth every evening.     . predniSONE (STERAPRED UNI-PAK 21 TAB) 10 MG (21) TBPK tablet Take 1 tablet (10 mg total) by mouth 3 (three) times daily. Take 20 mg on evening of 9/3 10 mg 3 times per day 9/4-9/6 5 mg 3 times per day 9/7-9/9 60 tablet 0  . sertraline (ZOLOFT) 100 MG tablet Take 150 mg by mouth every morning.     . sulfamethoxazole-trimethoprim (BACTRIM DS,SEPTRA DS) 800-160 MG tablet Take 1 tablet by mouth 2 (two) times daily. 20 tablet 0  . tamsulosin (FLOMAX) 0.4 MG CAPS capsule Take 1 capsule (0.4 mg total) by mouth daily. 30 capsule 0  . traZODone (DESYREL) 50 MG tablet Take 1 tablet (50 mg total) by mouth at bedtime. 30 tablet 1  . albuterol (PROVENTIL HFA;VENTOLIN HFA) 108 (90 Base) MCG/ACT inhaler Inhale 2 puffs into the lungs every 6 (six) hours as needed for wheezing or shortness of breath. 1 Inhaler 0  . Amino Acids-Protein Hydrolys (FEEDING SUPPLEMENT, PRO-STAT SUGAR FREE 64,) LIQD Take 30 mLs by mouth 2 (two) times daily. (Patient not taking: Reported on 05/04/2016) 900 mL 0  . aspirin EC 81 MG EC tablet Take 1 tablet (81 mg total) by mouth daily.    . Ca Phosphate-Cholecalciferol (CALTRATE GUMMY BITES) 250-400 MG-UNIT CHEW Chew 2 tablets by mouth 2 (two) times  daily.    . Melatonin 3 MG TABS Take 3 mg by mouth at bedtime.    . nitroGLYCERIN (NITROSTAT) 0.4 MG SL tablet Place 1 tablet (0.4 mg total) under the tongue every 5 (five) minutes as needed for chest pain (As needed for chest pain). (Patient not taking: Reported on 05/04/2016) 25 tablet 3  . potassium chloride SA (K-DUR,KLOR-CON) 20 MEQ tablet Take 20 mEq by mouth daily.     . ranitidine (ZANTAC) 150 MG tablet TAKE 1 TABLET BY MOUTH 2 TIMES DAILY. (Patient taking differently: Take 150 mg by mouth at bedtime) 180 tablet 3  . senna (SENOKOT) 8.6 MG tablet Take 1 tablet by mouth daily.      No current facility-administered  medications on file prior to visit.    Past Medical History:  Diagnosis Date  . Anemia 2014   was on iron pills and then was able to come off  . Arthritis    "all over"  . Asthma       . Atrial flutter ([place])    a. diagnosed 03/2015  . Bacteremia 03/2016  . Complication of anesthesia    B/P dropped low and had to stay in recovery longer  . Coronary artery disease    a. s/p CABG 1986 b. redo CABG 1993 c. PTCA 2008 d. DES 2010  . Depression       . Diverticulosis   . GERD (gastroesophageal reflux disease)       . Hepatitis    hepatitis- long time ago, occupational contamination   . Hiatal hernia   . Hyperlipemia   . Hypertension       . Insomnia       . Kidney stones   . Myocardial infarction ([place])   . On home oxygen therapy    "2L; just at night" (01/31/2015)  . Peripheral neuropathy ([place])   . Peripheral vascular disease (HCC)    thrombus- in leg- many yrs. ago- ?R- coumadin x1 mth  . Pneumonia 1960's  . Presence of permanent cardiac pacemaker   . Prostate cancer [facility])    radiation therapy- 2002  . Systolic CHF ([place])       . VT (ventricular tachycardia) ([place]) 06/2012   a. in the setting of STEMI b. on amio during hospitalization     Review of Systems  Constitutional: Positive for malaise/fatigue. Negative for fever.  Respiratory: Negative.   Cardiovascular: Negative.   Gastrointestinal: Positive for abdominal pain.  Musculoskeletal: Negative.   All other systems reviewed and are negative.    Vitals:   05/04/16 1014  BP: (!) 146/59  Pulse: 76  Temp: 98.3 F (36.8 C)  TempSrc: Oral    Physical Exam  Constitutional: He is oriented to person, place, and time. He appears well-developed. No distress.  Cardiovascular: Normal rate and regular rhythm.   Murmur heard. Pulmonary/Chest: Effort normal and breath sounds normal. No respiratory distress. He has no wheezes.  Abdominal: Soft. Bowel sounds are normal. He exhibits no distension and no mass. There is no  tenderness.  Biliary drain in place, surrounding dressing clean with no surrounding erythema. Drainage bag empty this morning.  Musculoskeletal: Normal range of motion. He exhibits no edema.  Neurological: He is alert and oriented to person, place, and time.  Nursing note and vitals reviewed.    A/P:  1. Klebsiella Bacteremia:    Afebrile, doing well with good compliance with his A/B.     S/P IV Antibiotic coverage, now compliant with Bactrim BID  for a total of 14 days A/B coverage. His last dose of Bactrim will be on 05/10/16.   2. Common Bile Duct Mass:     Outpatient GI and oncology follow up as instructed. I gave him the number to contact the oncologist for appointment.     He has Surgery follow tomorrow, I advised that they will check his drain again tomorrow.     Pain control with tylenol as needed.     Bmet checked to monitor for electrolyte abnormality given biliary drain. He will need repeat Bmet in 1 week as well.     F/U appointment scheduled with his PCP.     Note pathology report came back after he already left the clinic. I called and discussed report with him. He stated he already called the oncologist and they asked for him to come in today to be seen.     Patient advised to contact [facility] to check if they have the Pro Stat. He agreed with plan.  3. Headache: Currently asymptomatic. PCP/Dr .Lorenso Courier initially started him on high dose steroid for GCA now tapering off. F/U with tapering instruction as given. F/U with PCP for further management.  4. Anemia: Hemoglobin recheck.   5. Medication management: Lipitor and Lasix held from the hospital.    Doing okay off meds.    F/U with PCP in 1 week to address restarting meds if needed.

## 2016-05-04 ENCOUNTER — Ambulatory Visit (HOSPITAL_BASED_OUTPATIENT_CLINIC_OR_DEPARTMENT_OTHER): Payer: Medicare Other | Admitting: Oncology

## 2016-05-04 ENCOUNTER — Telehealth: Payer: Self-pay | Admitting: *Deleted

## 2016-05-04 ENCOUNTER — Encounter: Payer: Medicare Other | Admitting: Vascular Surgery

## 2016-05-04 ENCOUNTER — Ambulatory Visit (INDEPENDENT_AMBULATORY_CARE_PROVIDER_SITE_OTHER): Payer: Medicare Other | Admitting: Family Medicine

## 2016-05-04 ENCOUNTER — Encounter: Payer: Self-pay | Admitting: Family Medicine

## 2016-05-04 VITALS — BP 146/59 | HR 76 | Temp 98.3°F

## 2016-05-04 DIAGNOSIS — D649 Anemia, unspecified: Secondary | ICD-10-CM

## 2016-05-04 DIAGNOSIS — R5381 Other malaise: Secondary | ICD-10-CM | POA: Diagnosis not present

## 2016-05-04 DIAGNOSIS — K838 Other specified diseases of biliary tract: Secondary | ICD-10-CM

## 2016-05-04 DIAGNOSIS — R109 Unspecified abdominal pain: Secondary | ICD-10-CM

## 2016-05-04 DIAGNOSIS — D638 Anemia in other chronic diseases classified elsewhere: Secondary | ICD-10-CM

## 2016-05-04 DIAGNOSIS — B961 Klebsiella pneumoniae [K. pneumoniae] as the cause of diseases classified elsewhere: Secondary | ICD-10-CM

## 2016-05-04 DIAGNOSIS — K3189 Other diseases of stomach and duodenum: Secondary | ICD-10-CM | POA: Insufficient documentation

## 2016-05-04 DIAGNOSIS — I2510 Atherosclerotic heart disease of native coronary artery without angina pectoris: Secondary | ICD-10-CM | POA: Diagnosis not present

## 2016-05-04 DIAGNOSIS — R7881 Bacteremia: Secondary | ICD-10-CM | POA: Diagnosis not present

## 2016-05-04 DIAGNOSIS — K835 Biliary cyst: Secondary | ICD-10-CM

## 2016-05-04 LAB — CBC
HCT: 31.2 % — ABNORMAL LOW (ref 38.5–50.0)
HEMOGLOBIN: 10.4 g/dL — AB (ref 13.2–17.1)
MCH: 30.1 pg (ref 27.0–33.0)
MCHC: 33.3 g/dL (ref 32.0–36.0)
MCV: 90.2 fL (ref 80.0–100.0)
MPV: 9.8 fL (ref 7.5–12.5)
PLATELETS: 394 10*3/uL (ref 140–400)
RBC: 3.46 MIL/uL — AB (ref 4.20–5.80)
RDW: 14.7 % (ref 11.0–15.0)
WBC: 8.7 10*3/uL (ref 3.8–10.8)

## 2016-05-04 LAB — CULTURE, BLOOD (ROUTINE X 2)
Culture: NO GROWTH
Culture: NO GROWTH

## 2016-05-04 LAB — BASIC METABOLIC PANEL WITH GFR
BUN: 24 mg/dL (ref 7–25)
CALCIUM: 10.2 mg/dL (ref 8.6–10.3)
CO2: 24 mmol/L (ref 20–31)
Chloride: 102 mmol/L (ref 98–110)
Creat: 1.02 mg/dL (ref 0.70–1.11)
GFR, EST AFRICAN AMERICAN: 75 mL/min (ref >=60)
GFR, EST NON AFRICAN AMERICAN: 65 mL/min (ref >=60)
Glucose, Bld: 111 mg/dL — ABNORMAL HIGH (ref 65–99)
POTASSIUM: 3.9 mmol/L (ref 3.5–5.3)
SODIUM: 137 mmol/L (ref 135–146)

## 2016-05-04 LAB — BODY FLUID CULTURE: CULTURE: NO GROWTH

## 2016-05-04 NOTE — Patient Instructions (Signed)
It was nice seeing you today. I am glad you feel a bit better since you left the hospital. We will check your CBC for anemia to assess your tiredness. I will also check your electrolytes. Please schedule follow up with Oncologist as well as Surgeon as planned. You have follow up with your PCP in 1 wk. I will call to find out about your Pro-stat. Please f/u as needed.

## 2016-05-04 NOTE — Progress Notes (Signed)
[place] New Patient Consult   Referring MD: Tawanna Cooler [place], [place] [id number]   [name] 82 y.o.  1925-01-16    Reason for Referral: Duodenal mass   HPI: He reports a history of fever and sweats. He was diagnosed with "strep throat ". He underwent a temporal artery biopsy to rule out arteritis after he developed headaches. He reports the biopsy was negative. He had persistent fever. He saw Dr. Stanford Breed and blood cultures were obtained. They returned positive for Klebsiella. He was admitted for further evaluation. The liver enzymes and bilirubin were elevated. A CT of the abdomen and pelvis on 04/28/2016 revealed marketed intra-and extrahepatic biliary dilatation with a soft tissue filling defect in the distal common bile duct. The pancreatic ducts were dilated. The gallbladder is mildly distended. Dr. Ardis Hughs was consulted. He was taken to an ERCP procedure on 04/30/2016. A friable mass was noted around the major papilla measuring 4 cm. No duodenal obstruction. There was an obvious interface between the lesion and normal duodenal mucosa at the distal edge. The bile duct could not be cannulated. Biopsies were obtained. The pathology [id number]) revealed a tubulovillous adenoma. High-grade dysplasia was not identified.  He was referred to interventional radiology and underwent placement of an internal-external biliary drain on 05/01/2016.  He complains of persistent malaise. He is completing an outpatient course of antibiotics.  Past Medical History:  Diagnosis Date  . Anemia 2014   was on iron pills and then was able to come off  . Arthritis    "all over"  . Asthma       .[name] Atrial flutter ([place])    a. diagnosed 03/2015  . Klebsiella bacteremia secondary to biliary obstruction  03/2016  . Complication of anesthesia    B/P dropped low and had to stay in recovery longer  . Coronary artery disease    a. s/p CABG 1986 b. redo CABG 1993 c.  PTCA 2008 d. DES 2010  . Depression       . Diverticulosis   . GERD (gastroesophageal reflux disease)       . Hepatitis    hepatitis- long time ago, occupational contamination   . Hiatal hernia   . Hyperlipemia   . Hypertension   .  Mononucleosis associated hepatitis at age 38   . Insomnia   .  Right foot drop following a spine procedure-persistent   . Kidney stones   . Myocardial infarction ([place])   . On home oxygen therapy    "2L; just at night" (01/31/2015)  . Peripheral neuropathy ([place])   . Peripheral vascular disease (HCC)    thrombus- in leg- many yrs. ago- ?R- coumadin x1 mth  . Pneumonia 1960's  . Presence of permanent cardiac pacemaker   . Prostate cancer [facility])    radiation therapy- 2002  . Systolic CHF ([place])       . VT (ventricular tachycardia) ([place]) 06/2012   a. in the setting of STEMI b. on amio during hospitalization    Past Surgical History:  Procedure Laterality Date  . APPENDECTOMY  1943  . CATARACT EXTRACTION W/ INTRAOCULAR LENS  IMPLANT, BILATERAL    . COLONOSCOPY    . CORONARY ARTERY BYPASS GRAFT  1986;  1993   CABG X 5; CABG X 1  . CYSTOSCOPY W/ URETERAL STENT PLACEMENT Right 02/28/2013   Procedure: CYSTOSCOPY WITH RETROGRADE PYELOGRAM/URETERAL STENT PLACEMENT;  Surgeon: Dutch Gray, MD;  Location: [facility];  Service:  Urology;  Laterality: Right;  . CYSTOSCOPY WITH URETEROSCOPY AND STENT PLACEMENT Right 03/23/2013   Procedure: RIGHT URETEROSCOPY, LASER LITHO AND STENT PLACEMENT;  Surgeon: Fredricka Bonine, MD;  Location: [facility];  Service: Urology;  Laterality: Right;  . EP IMPLANTABLE DEVICE N/A 04/11/2015   Procedure: Pacemaker Implant;  Surgeon: Will Meredith Leeds, MD;  Location: [place] [facility];  Service: Cardiovascular;  Laterality: N/A;  . ERCP N/A 04/30/2016   Procedure: ENDOSCOPIC RETROGRADE CHOLANGIOPANCREATOGRAPHY (ERCP);  Surgeon: Milus Banister, MD;  Location: [place];  Service: Endoscopy;  Laterality: N/A;  . FOOT FUSION Left 2000    heel  . FRACTURE SURGERY     heel- crushed -2002,  (hardware)   . HAMMER TOE SURGERY Right    little toe  . HARDWARE REMOVAL Right 2002   great toe; Dr. Sharol Given  . HOLMIUM LASER APPLICATION Right [id number]   Procedure: HOLMIUM LASER APPLICATION;  Surgeon: Fredricka Bonine, MD;  Location: [facility];  Service: Urology;  Laterality: Right;  . INGUINAL HERNIA REPAIR Left 01/31/2015   Procedure: OPEN REPAIR RECURRENT LEFT INGUINAL HERNIA;  Surgeon: Fanny Skates, MD;  Location: [place];  Service: General;  Laterality: Left;  . INSERTION OF MESH Left 01/31/2015   Procedure: INSERTION OF MESH;  Surgeon: Fanny Skates, MD;  Location: [place] [facility];  Service: General;  Laterality: Left;  . INTRAMEDULLARY (IM) NAIL INTERTROCHANTERIC Right 08/29/2013   Procedure: INTRAMEDULLARY (IM) NAIL INTERTROCHANTRIC;  Surgeon: Marianna Payment, MD;  Location: [place];  Service: Orthopedics;  Laterality: Right;  . IR GENERIC HISTORICAL  05/01/2016   IR INT EXT BILIARY DRAIN WITH CHOLANGIOGRAM 05/01/2016 Corrie Mckusick, DO [facility]-INTERV RAD  . JOINT REPLACEMENT     R great toe  . KNEE ARTHROSCOPY Right X 2  . LEFT AND RIGHT HEART CATHETERIZATION WITH CORONARY ANGIOGRAM N/A 07/15/2012   Procedure: LEFT AND RIGHT HEART CATHETERIZATION WITH CORONARY ANGIOGRAM;  Surgeon: Jettie Booze, MD;  Location: [facility] CATH LAB;  Service: Cardiovascular;  Laterality: N/A;  . LUMBAR LAMINECTOMY/DECOMPRESSION MICRODISCECTOMY  08/10/2011   Procedure: LUMBAR LAMINECTOMY/DECOMPRESSION MICRODISCECTOMY;  Surgeon: Cooper Render Pool;  Location: [place] [facility];  Service: Neurosurgery;  Laterality: Right;  RIGHT Lumbar five-sacral one LAMINECTOMY, MICRODISCECTOMY  . LUMBAR WOUND DEBRIDEMENT  08/22/2011   Procedure: LUMBAR WOUND DEBRIDEMENT;  Surgeon: Eustace Moore;  Location: [place] [facility]O [facility]S;  Service: Neurosurgery;  Laterality: N/A;  Repair of CSF Leak requiring laminectomy  . PERCUTANEOUS CORONARY STENT INTERVENTION (PCI-S) N/A 07/15/2012   Procedure: PERCUTANEOUS  CORONARY STENT INTERVENTION (PCI-S);  Surgeon: Jettie Booze, MD;  Location: [facility] CATH LAB;  Service: Cardiovascular;  Laterality: N/A;  . RHINOPLASTY  [id number]  . TEE WITHOUT CARDIOVERSION N/A 04/28/2016   Procedure: TRANSESOPHAGEAL ECHOCARDIOGRAM (TEE);  Surgeon: Skeet Latch, MD;  Location: [facility] ENDOSCOPY;  Service: Cardiovascular;  Laterality: N/A;  . TONSILLECTOMY  1940  . TRANSURETHRAL RESECTION OF PROSTATE  x2    Medications: Reviewed  Allergies: No Known Allergies  Family history: A brother died of prostate cancer. Another brother died of lung cancer and was a smoker. He had 5 brothers and 2 sisters. No other family history of cancer  Social History:   He is retired from the [employer]. He does not use cigarettes or alcohol. He was transfused with packed red blood cells 04/29/2016 and with hip surgery in the past.    ROS:   Positives include: Anorexia, 5-6 pound weight loss, mild nausea, episode of diarrhea today, easy bruising, "basal cell "at the  right ear-surgery has been placed on hold  A complete ROS was otherwise negative.  Physical Exam:  Blood pressure (!) 144/61, pulse 70, temperature 98 F (36.7 C), temperature source Oral, resp. rate 16, height 5\' 10"  (1.778 m), weight 153 lb 11.2 oz (69.7 kg), SpO2 95 %.  HEENT: Oropharynx without visible mass, neck without mass Lungs: Clear bilaterally Cardiac: Regular rate and rhythm, 2/6 systolic murmur Abdomen: No hepatosplenomegaly, no mass, nontender, right upper quadrant biliary drain site with a gauze dressing GU: Testes without mass  Vascular: No leg edema Lymph nodes: No cervical, supraclavicular, axillary, or inguinal nodes Neurologic: Weakness with dorsi flexion at the right foot Skin: Multiple benign appearing moles over the trunk. Irregular hyperpigmented mole at the right parietal scalp Musculoskeletal: No spine tenderness   LAB:  CBC  Lab Results  Component Value Date   WBC 6.8  05/02/2016   HGB 9.1 (L) 05/02/2016   HCT 28.6 (L) 05/02/2016   MCV 92.9 05/02/2016   PLT 263 05/02/2016   NEUTROABS 7.4 04/27/2016     CMP      Component Value Date/Time   NA 136 05/02/2016 0158   K 4.0 05/02/2016 0158   CL 106 05/02/2016 0158   CO2 25 05/02/2016 0158   GLUCOSE 104 (H) 05/02/2016 0158   BUN 19 05/02/2016 0158   CREATININE 0.82 05/02/2016 0158   CREATININE 0.86 01/16/2016 1559   CALCIUM 9.2 05/02/2016 0158   PROT 6.2 (L) 05/02/2016 0158   ALBUMIN 2.1 (L) 05/02/2016 0158   AST 191 (H) 05/02/2016 0158   ALT 142 (H) 05/02/2016 0158   ALKPHOS 678 (H) 05/02/2016 0158   BILITOT 1.0 05/02/2016 0158   GFRNONAA >60 05/02/2016 0158   GFRNONAA 77 01/16/2016 1559   GFRAA >60 05/02/2016 0158   GFRAA >89 01/16/2016 1559    No results found for: CEA1  Imaging: CT images from 04/28/2016 reviewed with Mr. Rudell Cobb and his wife   Assessment/Plan:   1. Duodenal mass, status post a biopsy 04/30/2016 revealing a tubulovillous adenoma-high-grade dysplasia not identified  2. Biliary obstruction secondary to #1, status post placement of an internal-external biliary drain on 05/01/2016  3.   Klebsiella bacteremia 04/26/2016 secondary to biliary obstruction  4.   Anemia secondary to bacteremia and chronic disease  5.   Right foot drop  6.   Coronary artery disease  7.   Arrhythmia-status post pacemaker placement  8.   History prostate cancer treated with radiation   9.   Irregular hyperpigmented mole at the parietal scalp      Disposition:   Mr. Giusti has a complex medical history. He was admitted with Klebsiella bacteremia secondary to biliary obstruction. An ERCP confirmed a duodenal mass.  Biopsy of the mass revealed a tubulovillous adenoma. He may have a benign adenoma causing biliary obstruction or the lesion could represent an invasive tumor.  He has been referred to [name] to consider surgical options. He may not be a surgical candidate  secondary to his age and multiple comorbid conditions.  The mildly elevated CA 19-9 is nonspecific and most likely related to obstruction of the bile duct and pancreatic duct.  He should be a candidate for placement of an internal biliary stent within the next few weeks.  If resection of the duodenal mass is not planned we can ask Dr. Ardis Hughs to perform another biopsy.  Mr. Nelli is not scheduled for a follow-up appointment at the [facility]. I am available to see him pending the  decision on surgery.  I will ask [name] to evaluate the hyperpigmented lesion at the right parietal scalp.  Approximately 50 minutes were spent with the patient today. The majority of the time was used for counseling and coordination of care.  Betsy Coder, MD  05/04/2016, 4:54 PM

## 2016-05-04 NOTE — Telephone Encounter (Signed)
Message received from pt inquiring about a referral appt with [name].  Call placed back to patient to inform him that our GI Navigator will be contacting him this week with details about his appointment.  Pt appreciative of call back.

## 2016-05-05 ENCOUNTER — Other Ambulatory Visit: Payer: Self-pay | Admitting: General Surgery

## 2016-05-05 NOTE — Progress Notes (Deleted)
No chief complaint on file.   Referring MD: ***  HISTORY: ***  Past Medical History:  Diagnosis Date  . Anemia 2014   was on iron pills and then was able to come off  . Arthritis    "all over"  . Asthma       . Atrial flutter ([place])    a. diagnosed 03/2015  . Bacteremia 03/2016  . Complication of anesthesia    B/P dropped low and had to stay in recovery longer  . Coronary artery disease    a. s/p CABG 1986 b. redo CABG 1993 c. PTCA 2008 d. DES 2010  . Depression       . Diverticulosis   . GERD (gastroesophageal reflux disease)       . Hepatitis    hepatitis- long time ago, occupational contamination   . Hiatal hernia   . Hyperlipemia   . Hypertension       . Insomnia       . Kidney stones   . Myocardial infarction ([place])   . On home oxygen therapy    "2L; just at night" (01/31/2015)  . Peripheral neuropathy ([place])   . Peripheral vascular disease (H[facility])    thrombus- in leg- many yrs. ago- ?R- coumadin x1 mth  . Pneumonia 1960's  . Presence of permanent cardiac pacemaker   . Prostate cancer [facility])    radiation therapy- 2002  . Systolic CHF ([place])       . VT (ventricular tachycardia) ([place]) 06/2012   a. in the setting of STEMI b. on amio during hospitalization    Past Surgical History:  Procedure Laterality Date  . APPENDECTOMY  1943  . CATARACT EXTRACTION W/ INTRAOCULAR LENS  IMPLANT, BILATERAL    . COLONOSCOPY    . CORONARY ARTERY BYPASS GRAFT  1986;  1993   CABG X 5; CABG X 1  . CYSTOSCOPY W/ URETERAL STENT PLACEMENT Right 02/28/2013   Procedure: CYSTOSCOPY WITH RETROGRADE PYELOGRAM/URETERAL STENT PLACEMENT;  Surgeon: Dutch Gray, MD;  Location: [facility];  Service: Urology;  Laterality: Right;  . CYSTOSCOPY WITH URETEROSCOPY AND STENT PLACEMENT Right 03/23/2013   Procedure: RIGHT URETEROSCOPY, LASER LITHO AND STENT PLACEMENT;  Surgeon: Fredricka Bonine, MD;  Location: [facility];  Service: Urology;  Laterality: Right;  . EP IMPLANTABLE DEVICE N/A 04/11/2015    Procedure: Pacemaker Implant;  Surgeon: Will Meredith Leeds, MD;  Location: [place] CV LAB;  Service: Cardiovascular;  Laterality: N/A;  . ERCP N/A 04/30/2016   Procedure: ENDOSCOPIC RETROGRADE CHOLANGIOPANCREATOGRAPHY (ERCP);  Surgeon: Milus Banister, MD;  Location: [place];  Service: Endoscopy;  Laterality: N/A;  . FOOT FUSION Left 2000   heel  . FRACTURE SURGERY     heel- crushed -2002,  (hardware)   . HAMMER TOE SURGERY Right    little toe  . HARDWARE REMOVAL Right 2002   great toe; Dr. Sharol Given  . HOLMIUM LASER APPLICATION Right 1/51/7616   Procedure: HOLMIUM LASER APPLICATION;  Surgeon: Fredricka Bonine, MD;  [facility];  Service: Urology;  Laterality: Right;  . INGUINAL HERNIA REPAIR Left 01/31/2015   Procedure: OPEN REPAIR RECURRENT LEFT INGUINAL HERNIA;  Surgeon: Fanny Skates, MD;  Location: [place];  Service: General;  Laterality: Left;  . INSERTION OF MESH Left 01/31/2015   Procedure: INSERTION OF MESH;  Surgeon: Fanny Skates, MD;  Location: [place];  Service: General;  Laterality: Left;  . INTRAMEDULLARY (IM) NAIL INTERTROCHANTERIC Right 08/29/2013   Procedure: INTRAMEDULLARY (IM) NAIL INTERTROCHANTRIC;  Surgeon: Marianna Payment, MD;  Location: [place];  Service: Orthopedics;  Laterality: Right;  . IR GENERIC HISTORICAL  05/01/2016   IR INT EXT BILIARY DRAIN WITH CHOLANGIOGRAM 05/01/2016 Corrie Mckusick, DO [facility]-INTERV RAD  . JOINT REPLACEMENT     R great toe  . KNEE ARTHROSCOPY Right X 2  . LEFT AND RIGHT HEART CATHETERIZATION WITH CORONARY ANGIOGRAM N/A 07/15/2012   Procedure: LEFT AND RIGHT HEART CATHETERIZATION WITH CORONARY ANGIOGRAM;  Surgeon: Jettie Booze, MD;  Location: [facility] CATH LAB;  Service: Cardiovascular;  Laterality: N/A;  . LUMBAR LAMINECTOMY/DECOMPRESSION MICRODISCECTOMY  08/10/2011   Procedure: LUMBAR LAMINECTOMY/DECOMPRESSION MICRODISCECTOMY;  Surgeon: Cooper Render Pool;  Location: [place] NEURO ORS;  Service: Neurosurgery;  Laterality: Right;  RIGHT Lumbar  five-sacral one LAMINECTOMY, MICRODISCECTOMY  . LUMBAR WOUND DEBRIDEMENT  08/22/2011   Procedure: LUMBAR WOUND DEBRIDEMENT;  Surgeon: Eustace Moore;  Location: [place] NEURO ORS;  Service: Neurosurgery;  Laterality: N/A;  Repair of CSF Leak requiring laminectomy  . PERCUTANEOUS CORONARY STENT INTERVENTION (PCI-S) N/A 07/15/2012   Procedure: PERCUTANEOUS CORONARY STENT INTERVENTION (PCI-S);  Surgeon: Jettie Booze, MD;  Location: [facility] CATH LAB;  Service: Cardiovascular;  Laterality: N/A;  . RHINOPLASTY  [id number]  . TEE WITHOUT CARDIOVERSION N/A 04/28/2016   Procedure: TRANSESOPHAGEAL ECHOCARDIOGRAM (TEE);  Surgeon: Skeet Latch, MD;  Location: [facility] ENDOSCOPY;  Service: Cardiovascular;  Laterality: N/A;  . TONSILLECTOMY  1940  . TRANSURETHRAL RESECTION OF PROSTATE  x2    Current Outpatient Prescriptions  Medication Sig Dispense Refill  . albuterol (PROVENTIL HFA;VENTOLIN HFA) 108 (90 Base) MCG/ACT inhaler Inhale 2 puffs into the lungs every 6 (six) hours as needed for wheezing or shortness of breath. 1 Inhaler 0  . Amino Acids-Protein Hydrolys (FEEDING SUPPLEMENT, PRO-STAT SUGAR FREE 64,) LIQD Take 30 mLs by mouth 2 (two) times daily. (Patient not taking: Reported on 05/04/2016) 900 mL 0  . aspirin EC 81 MG EC tablet Take 1 tablet (81 mg total) by mouth daily.    . budesonide-formoterol (SYMBICORT) 80-4.5 MCG/ACT inhaler Inhale 2 puffs into the lungs 2 (two) times daily.    . Ca Phosphate-Cholecalciferol (CALTRATE GUMMY BITES) 250-400 MG-UNIT CHEW Chew 2 tablets by mouth 2 (two) times daily.    . carbamazepine (TEGRETOL) 100 MG chewable tablet Chew 300 mg by mouth 2 (two) times daily.    [name] docusate sodium (COLACE) 100 MG capsule Take 100 mg by mouth every evening.     . feeding supplement, ENSURE ENLIVE, (ENSURE ENLIVE) LIQD Take 237 mLs by mouth 2 (two) times daily between meals. 237 mL 12  . finasteride (PROSCAR) 5 MG tablet Take 5 mg by mouth every morning.     [name] ipratropium (ATROVENT HFA) 17  MCG/ACT inhaler Inhale 2 puffs into the lungs 2 (two) times daily as needed for wheezing.     [name] ipratropium (ATROVENT) 0.02 % nebulizer solution Take 2.5 mLs (0.5 mg total) by nebulization 2 (two) times daily as needed for wheezing or shortness of breath. 75 mL 12  . isosorbide mononitrate (IMDUR) 30 MG 24 hr tablet Take 1 tablet (30 mg total) by mouth daily. 30 tablet 0  . Melatonin 3 MG TABS Take 3 mg by mouth at bedtime.    . metoprolol succinate (TOPROL-XL) 12.5 mg TB24 24 hr tablet Take 12.5 mg by mouth daily.    . Multiple Vitamins-Minerals (MULTIVITAMIN WITH MINERALS) tablet Take 1 tablet by mouth every evening.     . nitroGLYCERIN (NITROSTAT) 0.4 MG SL tablet Place 1 tablet (0.4  mg total) under the tongue every 5 (five) minutes as needed for chest pain (As needed for chest pain). (Patient not taking: Reported on 05/04/2016) 25 tablet 3  . potassium chloride SA (K-DUR,KLOR-CON) 20 MEQ tablet Take 20 mEq by mouth daily.     . predniSONE (STERAPRED UNI-PAK 21 TAB) 10 MG (21) TBPK tablet Take 1 tablet (10 mg total) by mouth 3 (three) times daily. Take 20 mg on evening of 9/3 10 mg 3 times per day 9/4-9/6 5 mg 3 times per day 9/7-9/9 60 tablet 0  . ranitidine (ZANTAC) 150 MG tablet TAKE 1 TABLET BY MOUTH 2 TIMES DAILY. (Patient taking differently: Take 150 mg by mouth at bedtime) 180 tablet 3  . senna (SENOKOT) 8.6 MG tablet Take 1 tablet by mouth daily.     . sertraline (ZOLOFT) 100 MG tablet Take 150 mg by mouth every morning.     . sulfamethoxazole-trimethoprim (BACTRIM DS,SEPTRA DS) 800-160 MG tablet Take 1 tablet by mouth 2 (two) times daily. 20 tablet 0  . tamsulosin (FLOMAX) 0.4 MG CAPS capsule Take 1 capsule (0.4 mg total) by mouth daily. 30 capsule 0  . traZODone (DESYREL) 50 MG tablet Take 1 tablet (50 mg total) by mouth at bedtime. 30 tablet 1   No current facility-administered medications for this visit.      No Known Allergies   Family History  Problem Relation Age of Onset   . Heart attack Mother   . Addison's disease Father   . Heart attack Sister   . Anesthesia problems Neg Hx   . Hypotension Neg Hx   . Malignant hyperthermia Neg Hx   . Pseudochol deficiency Neg Hx      Social History   Social History  . Marital status: Married    Spouse name: N/A  . Number of children: N/A  . Years of education: N/A   Social History Main Topics  . Smoking status: Never Smoker  . Smokeless tobacco: Never Used  . Alcohol use No  . Drug use: No  . Sexual activity: Not on file   Other Topics Concern  . Not on file   Social History Narrative  . No narrative on file     REVIEW OF SYSTEMS - PERTINENT POSITIVES ONLY: 12 point review of systems negative other than HPI and PMH except for ***  EXAM: There were no vitals filed for this visit.  Wt Readings from Last 3 Encounters:  05/04/16 69.7 kg (153 lb 11.2 oz)  05/02/16 68.6 kg (151 lb 3.2 oz)  04/26/16 72.1 kg (159 lb)     Gen:  No acute distress.  Well nourished and well groomed.   Neurological: Alert and oriented to person, place, and time. Coordination normal.  Head: Normocephalic and atraumatic.  Eyes: Conjunctivae are normal. Pupils are equal, round, and reactive to light. No scleral icterus.  Neck: Normal range of motion. Neck supple. No tracheal deviation or thyromegaly present.  Cardiovascular: Normal rate, regular rhythm, normal heart sounds and intact distal pulses.  Exam reveals no gallop and no friction rub.  No murmur heard. Breast: *** Respiratory: Effort normal.  No respiratory distress. No chest wall tenderness. Breath sounds normal.  No wheezes, rales or rhonchi.  GI: Soft. Bowel sounds are normal. The abdomen is soft and nontender.  There is no rebound and no guarding.  Rectal:  *** Musculoskeletal: Normal range of motion. Extremities are nontender.  Lymphadenopathy: No cervical, preauricular, postauricular or axillary adenopathy is present Skin: Skin is  warm and dry. No rash  noted. No diaphoresis. No erythema. No pallor. No clubbing, cyanosis, or edema.   Psychiatric: Normal mood and affect. Behavior is normal. Judgment and thought content normal.    LABORATORY RESULTS: Available labs are reviewed   Recent Results (from the past 2160 hour(s))  CBC     Status: Abnormal   Collection Time: 02/18/16 11:22 AM  Result Value Ref Range   WBC 6.9 3.8 - 10.8 K/uL   RBC 3.90 (L) 4.20 - 5.80 MIL/uL   Hemoglobin 12.0 (L) 13.2 - 17.1 g/dL   HCT 35.3 (L) 38.5 - 50.0 %   MCV 90.5 80.0 - 100.0 fL   MCH 30.8 27.0 - 33.0 pg   MCHC 34.0 32.0 - 36.0 g/dL   RDW 14.4 11.0 - 15.0 %   Platelets 316 140 - 400 K/uL   MPV 9.0 7.5 - 12.5 fL    Comment: ** Please note change in unit of measure and reference range(s). **  POC Hemoccult Bld/Stl (3-Cd Home Screen)     Status: None   Collection Time: 02/25/16  9:26 AM  Result Value Ref Range   Card #1 Date 02/21/2016    Fecal Occult Blood, POC Negative Negative   Card #2 Date 02/22/2016    Card #2 Fecal Occult Blod, POC Negative    Card #3 Date 02/23/2016    Card #3 Fecal Occult Blood, POC Negative   Rapid Strep A     Status: Abnormal   Collection Time: 03/22/16 11:16 AM  Result Value Ref Range   Rapid Strep A Screen Positive (A) Negative  CBC     Status: Abnormal   Collection Time: 03/22/16 11:58 AM  Result Value Ref Range   WBC 6.1 3.8 - 10.8 K/uL   RBC 3.89 (L) 4.20 - 5.80 MIL/uL   Hemoglobin 11.9 (L) 13.2 - 17.1 g/dL   HCT 35.5 (L) 38.5 - 50.0 %   MCV 91.3 80.0 - 100.0 fL   MCH 30.6 27.0 - 33.0 pg   MCHC 33.5 32.0 - 36.0 g/dL   RDW 14.6 11.0 - 15.0 %   Platelets 340 140 - 400 K/uL   MPV 8.8 7.5 - 12.5 fL    Comment: ** Please note change in unit of measure and reference range(s). **  C-reactive protein     Status: Abnormal   Collection Time: 03/22/16 11:58 AM  Result Value Ref Range   CRP 3.4 (H) <0.60 mg/dL  POCT SEDIMENTATION RATE     Status: Abnormal   Collection Time: 03/22/16 12:00 PM  Result Value Ref  Range   POCT SED RATE 41 (A) 0 - 22 mm/hr  Sed Rate (ESR)     Status: Abnormal   Collection Time: 04/26/16 11:08 AM  Result Value Ref Range   Sed Rate 133 (H) 0 - 20 mm/hr  Culture, blood (single) w Reflex to ID Panel     Status: None   Collection Time: 04/26/16 11:08 AM  Result Value Ref Range   Organism ID, Bacteria KLEBSIELLA PNEUMONIAE     Comment: Susceptibilities performed on previous culture within the last 5 days. Gram Stain Report Called to,Read Back By and Verified With: Trixie Dredge RN @ 9:37 AM 04/27/16 BY DWEEKS   Culture, blood (single) w Reflex to ID Panel     Status: None   Collection Time: 04/26/16 11:16 AM  Result Value Ref Range   Culture KLEBSIELLA PNEUMONIAE    Organism ID, Bacteria KLEBSIELLA PNEUMONIAE  Comment: Report Faxed by Request Gram Stain Report Called to,Read Back By and Verified With: NATE R @ 12:02 PM 04/27/16 BY DWEEKS       Susceptibility   Klebsiella pneumoniae -  (no method available)    AMPICILLIN  Resistant     AMOX/CLAVULANIC 8 Sensitive     AMPICILLIN/SULBACTAM 4 Sensitive     PIP/TAZO <=4 Sensitive     IMIPENEM <=0.25 Sensitive     CEFAZOLIN <=4 Not Reportable     CEFTRIAXONE <=1 Sensitive     CEFTAZIDIME <=1 Sensitive     CEFEPIME <=1 Sensitive     GENTAMICIN <=1 Sensitive     TOBRAMYCIN <=1 Sensitive     CIPROFLOXACIN <=0.25 Sensitive     LEVOFLOXACIN <=0.12 Sensitive     TRIMETH/SULFA* <=20 Sensitive      * NR=NOT REPORTABLE,SEE COMMENTORAL therapy:A cefazolin MIC of <32 predicts susceptibility to the oral agents cefaclor,cefdinir,cefpodoxime,cefprozil,cefuroxime,cephalexin,and loracarbef when used for therapy of uncomplicated UTIs due to E.coli,K.pneumomiae,and P.mirabilis. PARENTERAL therapy: A cefazolinMIC of >8 indicates resistance to parenteralcefazolin. An alternate test method must beperformed to confirm susceptibility to parenteralcefazolin.  Culture, blood (routine x 2)     Status: None   Collection Time: 04/27/16   5:22 PM  Result Value Ref Range   Specimen Description BLOOD LEFT ARM    Special Requests BOTTLES DRAWN AEROBIC AND ANAEROBIC 5CC EA    Culture NO GROWTH 5 DAYS    Report Status 05/02/2016 FINAL   Culture, blood (routine x 2)     Status: None   Collection Time: 04/27/16  5:32 PM  Result Value Ref Range   Specimen Description BLOOD RIGHT ARM    Special Requests BOTTLES DRAWN AEROBIC AND ANAEROBIC 5CC EA    Culture NO GROWTH 5 DAYS    Report Status 05/02/2016 FINAL   Culture, Urine     Status: None   Collection Time: 04/27/16  5:35 PM  Result Value Ref Range   Specimen Description URINE, RANDOM    Special Requests NONE    Culture NO GROWTH    Report Status 04/28/2016 FINAL   Urinalysis, Routine w reflex microscopic (not at [facility])     Status: Abnormal   Collection Time: 04/27/16  5:35 PM  Result Value Ref Range   Color, Urine AMBER (A) YELLOW    Comment: BIOCHEMICALS MAY BE AFFECTED BY COLOR   APPearance CLEAR CLEAR   Specific Gravity, Urine 1.018 1.005 - 1.030   pH 7.0 5.0 - 8.0   Glucose, UA NEGATIVE NEGATIVE mg/dL   Hgb urine dipstick NEGATIVE NEGATIVE   Bilirubin Urine SMALL (A) NEGATIVE   Ketones, ur NEGATIVE NEGATIVE mg/dL   Protein, ur 30 (A) NEGATIVE mg/dL   Nitrite NEGATIVE NEGATIVE   Leukocytes, UA NEGATIVE NEGATIVE  Urine microscopic-add on     Status: Abnormal   Collection Time: 04/27/16  5:35 PM  Result Value Ref Range   Squamous Epithelial / LPF 0-5 (A) NONE SEEN   WBC, UA 0-5 0 - 5 WBC/hpf   RBC / HPF 0-5 0 - 5 RBC/hpf   Bacteria, UA RARE (A) NONE SEEN   Casts GRANULAR CAST (A) NEGATIVE    Comment: HYALINE CASTS  CBC WITH DIFFERENTIAL     Status: Abnormal   Collection Time: 04/27/16  5:47 PM  Result Value Ref Range   WBC 9.0 4.0 - 10.5 K/uL   RBC 3.18 (L) 4.22 - 5.81 MIL/uL   Hemoglobin 9.4 (L) 13.0 - 17.0 g/dL   HCT 29.5 (L)  39.0 - 52.0 %   MCV 92.8 78.0 - 100.0 fL   MCH 29.6 26.0 - 34.0 pg   MCHC 31.9 30.0 - 36.0 g/dL   RDW 14.9 11.5 - 15.5 %    Platelets 289 150 - 400 K/uL   Neutrophils Relative % 82 %   Neutro Abs 7.4 1.7 - 7.7 K/uL   Lymphocytes Relative 11 %   Lymphs Abs 1.0 0.7 - 4.0 K/uL   Monocytes Relative 7 %   Monocytes Absolute 0.6 0.1 - 1.0 K/uL   Eosinophils Relative 0 %   Eosinophils Absolute 0.0 0.0 - 0.7 K/uL   Basophils Relative 0 %   Basophils Absolute 0.0 0.0 - 0.1 K/uL  Lactic acid, plasma     Status: None   Collection Time: 04/27/16  5:48 PM  Result Value Ref Range   Lactic Acid, Venous 1.3 0.5 - 1.9 mmol/L  CBC     Status: Abnormal   Collection Time: 04/27/16  6:40 PM  Result Value Ref Range   WBC 8.9 4.0 - 10.5 K/uL   RBC 3.06 (L) 4.22 - 5.81 MIL/uL   Hemoglobin 9.0 (L) 13.0 - 17.0 g/dL   HCT 28.2 (L) 39.0 - 52.0 %   MCV 92.2 78.0 - 100.0 fL   MCH 29.4 26.0 - 34.0 pg   MCHC 31.9 30.0 - 36.0 g/dL   RDW 14.9 11.5 - 15.5 %   Platelets 280 150 - 400 K/uL  Comprehensive metabolic panel     Status: Abnormal   Collection Time: 04/27/16  6:40 PM  Result Value Ref Range   Sodium 134 (L) 135 - 145 mmol/L   Potassium 3.7 3.5 - 5.1 mmol/L   Chloride 101 101 - 111 mmol/L   CO2 26 22 - 32 mmol/L   Glucose, Bld 113 (H) 65 - 99 mg/dL   BUN 15 6 - 20 mg/dL   Creatinine, Ser 0.86 0.61 - 1.24 mg/dL   Calcium 9.5 8.9 - 10.3 mg/dL   Total Protein 6.7 6.5 - 8.1 g/dL   Albumin 2.3 (L) 3.5 - 5.0 g/dL   AST 148 (H) 15 - 41 U/L   ALT 110 (H) 17 - 63 U/L   Alkaline Phosphatase 643 (H) 38 - 126 U/L   Total Bilirubin 1.3 (H) 0.3 - 1.2 mg/dL   GFR calc non Af Amer >60 >60 mL/min   GFR calc Af Amer >60 >60 mL/min    Comment: (NOTE) The eGFR has been calculated using the CKD EPI equation. This calculation has not been validated in all clinical situations. eGFR's persistently <60 mL/min signify possible Chronic Kidney Disease.    Anion gap 7 5 - 15  Troponin I     Status: Abnormal   Collection Time: 04/27/16  6:40 PM  Result Value Ref Range   Troponin I 0.05 (HH) <0.03 ng/mL    Comment: CRITICAL RESULT CALLED  TO, READ BACK BY AND VERIFIED WITH: Joneen Roach 1933 04/27/16 D BRADLEY   Troponin I     Status: Abnormal   Collection Time: 04/27/16  8:44 PM  Result Value Ref Range   Troponin I 0.04 (HH) <0.03 ng/mL    Comment: CRITICAL VALUE NOTED.  VALUE IS CONSISTENT WITH PREVIOUSLY REPORTED AND CALLED VALUE.  Basic metabolic panel     Status: Abnormal   Collection Time: 04/28/16  2:58 AM  Result Value Ref Range   Sodium 137 135 - 145 mmol/L   Potassium 3.7 3.5 - 5.1 mmol/L   Chloride  104 101 - 111 mmol/L   CO2 27 22 - 32 mmol/L   Glucose, Bld 103 (H) 65 - 99 mg/dL   BUN 15 6 - 20 mg/dL   Creatinine, Ser 0.80 0.61 - 1.24 mg/dL   Calcium 9.4 8.9 - 10.3 mg/dL   GFR calc non Af Amer >60 >60 mL/min   GFR calc Af Amer >60 >60 mL/min    Comment: (NOTE) The eGFR has been calculated using the CKD EPI equation. This calculation has not been validated in all clinical situations. eGFR's persistently <60 mL/min signify possible Chronic Kidney Disease.    Anion gap 6 5 - 15  CBC     Status: Abnormal   Collection Time: 04/28/16  2:58 AM  Result Value Ref Range   WBC 7.5 4.0 - 10.5 K/uL   RBC 3.09 (L) 4.22 - 5.81 MIL/uL   Hemoglobin 9.0 (L) 13.0 - 17.0 g/dL   HCT 28.5 (L) 39.0 - 52.0 %   MCV 92.2 78.0 - 100.0 fL   MCH 29.1 26.0 - 34.0 pg   MCHC 31.6 30.0 - 36.0 g/dL   RDW 15.0 11.5 - 15.5 %   Platelets 261 150 - 400 K/uL  Troponin I     Status: Abnormal   Collection Time: 04/28/16  2:58 AM  Result Value Ref Range   Troponin I 0.05 (HH) <0.03 ng/mL    Comment: CRITICAL VALUE NOTED.  VALUE IS CONSISTENT WITH PREVIOUSLY REPORTED AND CALLED VALUE.  Troponin I     Status: Abnormal   Collection Time: 04/28/16 10:14 AM  Result Value Ref Range   Troponin I 0.04 (HH) <0.03 ng/mL    Comment: CRITICAL VALUE NOTED.  VALUE IS CONSISTENT WITH PREVIOUSLY REPORTED AND CALLED VALUE.  Hepatic function panel     Status: Abnormal   Collection Time: 04/28/16 10:14 AM  Result Value Ref Range   Total Protein 6.8  6.5 - 8.1 g/dL   Albumin 2.3 (L) 3.5 - 5.0 g/dL   AST 153 (H) 15 - 41 U/L   ALT 109 (H) 17 - 63 U/L   Alkaline Phosphatase 655 (H) 38 - 126 U/L   Total Bilirubin 2.2 (H) 0.3 - 1.2 mg/dL   Bilirubin, Direct 1.5 (H) 0.1 - 0.5 mg/dL   Indirect Bilirubin 0.7 0.3 - 0.9 mg/dL  Troponin I     Status: Abnormal   Collection Time: 04/28/16  3:05 PM  Result Value Ref Range   Troponin I 0.04 (HH) <0.03 ng/mL    Comment: CRITICAL VALUE NOTED.  VALUE IS CONSISTENT WITH PREVIOUSLY REPORTED AND CALLED VALUE.  Troponin I     Status: Abnormal   Collection Time: 04/28/16 10:19 PM  Result Value Ref Range   Troponin I 0.05 (HH) <0.03 ng/mL    Comment: CRITICAL VALUE NOTED.  VALUE IS CONSISTENT WITH PREVIOUSLY REPORTED AND CALLED VALUE.  Troponin I     Status: Abnormal   Collection Time: 04/29/16  1:58 AM  Result Value Ref Range   Troponin I 0.05 (HH) <0.03 ng/mL    Comment: CRITICAL VALUE NOTED.  VALUE IS CONSISTENT WITH PREVIOUSLY REPORTED AND CALLED VALUE.  CBC     Status: Abnormal   Collection Time: 04/29/16  1:58 AM  Result Value Ref Range   WBC 8.5 4.0 - 10.5 K/uL   RBC 2.68 (L) 4.22 - 5.81 MIL/uL   Hemoglobin 7.8 (L) 13.0 - 17.0 g/dL   HCT 24.5 (L) 39.0 - 52.0 %   MCV 91.4 78.0 - 100.0  fL   MCH 29.1 26.0 - 34.0 pg   MCHC 31.8 30.0 - 36.0 g/dL   RDW 14.9 11.5 - 15.5 %   Platelets 268 150 - 400 K/uL  Basic metabolic panel     Status: Abnormal   Collection Time: 04/29/16  1:58 AM  Result Value Ref Range   Sodium 134 (L) 135 - 145 mmol/L   Potassium 3.6 3.5 - 5.1 mmol/L   Chloride 101 101 - 111 mmol/L   CO2 26 22 - 32 mmol/L   Glucose, Bld 102 (H) 65 - 99 mg/dL   BUN 18 6 - 20 mg/dL   Creatinine, Ser 0.76 0.61 - 1.24 mg/dL   Calcium 9.4 8.9 - 10.3 mg/dL   GFR calc non Af Amer >60 >60 mL/min   GFR calc Af Amer >60 >60 mL/min    Comment: (NOTE) The eGFR has been calculated using the CKD EPI equation. This calculation has not been validated in all clinical situations. eGFR's persistently  <60 mL/min signify possible Chronic Kidney Disease.    Anion gap 7 5 - 15  Culture, blood (routine x 2)     Status: None   Collection Time: 04/29/16  1:59 AM  Result Value Ref Range   Specimen Description BLOOD RIGHT ANTECUBITAL    Special Requests BOTTLES DRAWN AEROBIC AND ANAEROBIC 5CC     Culture NO GROWTH 5 DAYS    Report Status 05/04/2016 FINAL   Culture, blood (routine x 2)     Status: None   Collection Time: 04/29/16  2:03 AM  Result Value Ref Range   Specimen Description BLOOD LEFT ANTECUBITAL    Special Requests BOTTLES DRAWN AEROBIC AND ANAEROBIC 5CC     Culture NO GROWTH 5 DAYS    Report Status 05/04/2016 FINAL   Troponin I     Status: Abnormal   Collection Time: 04/29/16  8:02 AM  Result Value Ref Range   Troponin I 0.05 (HH) <0.03 ng/mL    Comment: CRITICAL VALUE NOTED.  VALUE IS CONSISTENT WITH PREVIOUSLY REPORTED AND CALLED VALUE.  Hepatic function panel     Status: Abnormal   Collection Time: 04/29/16  8:02 AM  Result Value Ref Range   Total Protein 6.9 6.5 - 8.1 g/dL   Albumin 2.3 (L) 3.5 - 5.0 g/dL   AST 201 (H) 15 - 41 U/L   ALT 129 (H) 17 - 63 U/L   Alkaline Phosphatase 783 (H) 38 - 126 U/L   Total Bilirubin 2.5 (H) 0.3 - 1.2 mg/dL   Bilirubin, Direct 1.7 (H) 0.1 - 0.5 mg/dL   Indirect Bilirubin 0.8 0.3 - 0.9 mg/dL  Troponin I     Status: Abnormal   Collection Time: 04/29/16  2:21 PM  Result Value Ref Range   Troponin I 0.04 (HH) <0.03 ng/mL    Comment: CRITICAL VALUE NOTED.  VALUE IS CONSISTENT WITH PREVIOUSLY REPORTED AND CALLED VALUE.  Prepare RBC     Status: None   Collection Time: 04/29/16  2:21 PM  Result Value Ref Range   Order Confirmation ORDER PROCESSED BY BLOOD BANK   Type and screen [place]     Status: None   Collection Time: 04/29/16  2:27 PM  Result Value Ref Range   ABO/RH(D) AB POS    Antibody Screen NEG    Sample Expiration 05/02/2016    Unit Number [id number]    Blood Component Type RED CELLS,LR    Unit  division 00    Status  of Unit ISSUED,FINAL    Transfusion Status OK TO TRANSFUSE    Crossmatch Result Compatible   Troponin I     Status: Abnormal   Collection Time: 04/29/16  8:28 PM  Result Value Ref Range   Troponin I 0.04 (HH) <0.03 ng/mL    Comment: CRITICAL VALUE NOTED.  VALUE IS CONSISTENT WITH PREVIOUSLY REPORTED AND CALLED VALUE.  Troponin I     Status: Abnormal   Collection Time: 04/30/16  2:42 AM  Result Value Ref Range   Troponin I 0.04 (HH) <0.03 ng/mL    Comment: CRITICAL VALUE NOTED.  VALUE IS CONSISTENT WITH PREVIOUSLY REPORTED AND CALLED VALUE.  Cancer antigen 19-9     Status: Abnormal   Collection Time: 04/30/16  2:42 AM  Result Value Ref Range   CA 19-9 91 (H) 0 - 35 U/mL    Comment: (NOTE) [employer] ECLIA methodology Performed At: [facility] [place], [place] [id number] Lindon Romp MD CB:[id number]   CBC     Status: Abnormal   Collection Time: 04/30/16  2:42 AM  Result Value Ref Range   WBC 8.1 4.0 - 10.5 K/uL   RBC 3.36 (L) 4.22 - 5.81 MIL/uL   Hemoglobin 9.9 (L) 13.0 - 17.0 g/dL    Comment: REPEATED TO VERIFY POST TRANSFUSION SPECIMEN    HCT 30.9 (L) 39.0 - 52.0 %   MCV 92.0 78.0 - 100.0 fL   MCH 29.5 26.0 - 34.0 pg   MCHC 32.0 30.0 - 36.0 g/dL   RDW 14.7 11.5 - 15.5 %   Platelets 256 150 - 400 K/uL  Basic metabolic panel     Status: Abnormal   Collection Time: 04/30/16  2:42 AM  Result Value Ref Range   Sodium 135 135 - 145 mmol/L   Potassium 3.8 3.5 - 5.1 mmol/L   Chloride 106 101 - 111 mmol/L   CO2 25 22 - 32 mmol/L   Glucose, Bld 105 (H) 65 - 99 mg/dL   BUN 21 (H) 6 - 20 mg/dL   Creatinine, Ser 0.76 0.61 - 1.24 mg/dL   Calcium 9.4 8.9 - 10.3 mg/dL   GFR calc non Af Amer >60 >60 mL/min   GFR calc Af Amer >60 >60 mL/min    Comment: (NOTE) The eGFR has been calculated using the CKD EPI equation. This calculation has not been validated in all clinical situations. eGFR's persistently <60 mL/min signify possible  Chronic Kidney Disease.    Anion gap 4 (L) 5 - 15  Troponin I     Status: Abnormal   Collection Time: 04/30/16  7:39 AM  Result Value Ref Range   Troponin I 0.04 (HH) <0.03 ng/mL    Comment: CRITICAL VALUE NOTED.  VALUE IS CONSISTENT WITH PREVIOUSLY REPORTED AND CALLED VALUE.  Troponin I     Status: Abnormal   Collection Time: 04/30/16  4:31 PM  Result Value Ref Range   Troponin I 0.04 (HH) <0.03 ng/mL    Comment: CRITICAL VALUE NOTED.  VALUE IS CONSISTENT WITH PREVIOUSLY REPORTED AND CALLED VALUE.  Troponin I     Status: Abnormal   Collection Time: 04/30/16  8:23 PM  Result Value Ref Range   Troponin I 0.04 (HH) <0.03 ng/mL    Comment: CRITICAL VALUE NOTED.  VALUE IS CONSISTENT WITH PREVIOUSLY REPORTED AND CALLED VALUE.  Troponin I     Status: Abnormal   Collection Time: 05/01/16  2:21 AM  Result Value Ref Range   Troponin I 0.04 (  HH) <0.03 ng/mL    Comment: CRITICAL VALUE NOTED.  VALUE IS CONSISTENT WITH PREVIOUSLY REPORTED AND CALLED VALUE.  CBC     Status: Abnormal   Collection Time: 05/01/16  2:21 AM  Result Value Ref Range   WBC 7.1 4.0 - 10.5 K/uL   RBC 3.11 (L) 4.22 - 5.81 MIL/uL   Hemoglobin 9.1 (L) 13.0 - 17.0 g/dL   HCT 28.7 (L) 39.0 - 52.0 %   MCV 92.3 78.0 - 100.0 fL   MCH 29.3 26.0 - 34.0 pg   MCHC 31.7 30.0 - 36.0 g/dL   RDW 14.9 11.5 - 15.5 %   Platelets 246 150 - 400 K/uL  Comprehensive metabolic panel     Status: Abnormal   Collection Time: 05/01/16  2:21 AM  Result Value Ref Range   Sodium 137 135 - 145 mmol/L   Potassium 3.7 3.5 - 5.1 mmol/L   Chloride 105 101 - 111 mmol/L   CO2 24 22 - 32 mmol/L   Glucose, Bld 79 65 - 99 mg/dL   BUN 20 6 - 20 mg/dL   Creatinine, Ser 0.76 0.61 - 1.24 mg/dL   Calcium 9.3 8.9 - 10.3 mg/dL   Total Protein 6.0 (L) 6.5 - 8.1 g/dL   Albumin 2.0 (L) 3.5 - 5.0 g/dL   AST 193 (H) 15 - 41 U/L   ALT 128 (H) 17 - 63 U/L   Alkaline Phosphatase 713 (H) 38 - 126 U/L   Total Bilirubin 1.5 (H) 0.3 - 1.2 mg/dL   GFR calc non Af  Amer >60 >60 mL/min   GFR calc Af Amer >60 >60 mL/min    Comment: (NOTE) The eGFR has been calculated using the CKD EPI equation. This calculation has not been validated in all clinical situations. eGFR's persistently <60 mL/min signify possible Chronic Kidney Disease.    Anion gap 8 5 - 15  Protime-INR     Status: None   Collection Time: 05/01/16  2:21 AM  Result Value Ref Range   Prothrombin Time 15.0 11.4 - 15.2 seconds   INR 1.18   Troponin I     Status: Abnormal   Collection Time: 05/01/16  7:28 AM  Result Value Ref Range   Troponin I 0.04 (HH) <0.03 ng/mL    Comment: CRITICAL VALUE NOTED.  VALUE IS CONSISTENT WITH PREVIOUSLY REPORTED AND CALLED VALUE.  Body fluid culture     Status: None   Collection Time: 05/01/16  1:36 PM  Result Value Ref Range   Specimen Description FLUID BILE    Special Requests NONE    Gram Stain      RARE WBC PRESENT, PREDOMINANTLY PMN NO ORGANISMS SEEN    Culture NO GROWTH 3 DAYS    Report Status 05/04/2016 FINAL   Troponin I     Status: Abnormal   Collection Time: 05/01/16  3:26 PM  Result Value Ref Range   Troponin I 0.03 (HH) <0.03 ng/mL    Comment: CRITICAL VALUE NOTED.  VALUE IS CONSISTENT WITH PREVIOUSLY REPORTED AND CALLED VALUE.  Troponin I     Status: Abnormal   Collection Time: 05/01/16  7:27 PM  Result Value Ref Range   Troponin I 0.03 (HH) <0.03 ng/mL    Comment: CRITICAL VALUE NOTED.  VALUE IS CONSISTENT WITH PREVIOUSLY REPORTED AND CALLED VALUE.  Troponin I     Status: Abnormal   Collection Time: 05/02/16  1:58 AM  Result Value Ref Range   Troponin I 0.03 (HH) <0.03 ng/mL  Comment: CRITICAL VALUE NOTED.  VALUE IS CONSISTENT WITH PREVIOUSLY REPORTED AND CALLED VALUE.  CBC     Status: Abnormal   Collection Time: 05/02/16  1:58 AM  Result Value Ref Range   WBC 6.8 4.0 - 10.5 K/uL   RBC 3.08 (L) 4.22 - 5.81 MIL/uL   Hemoglobin 9.1 (L) 13.0 - 17.0 g/dL   HCT 28.6 (L) 39.0 - 52.0 %   MCV 92.9 78.0 - 100.0 fL   MCH 29.5 26.0  - 34.0 pg   MCHC 31.8 30.0 - 36.0 g/dL   RDW 15.0 11.5 - 15.5 %   Platelets 263 150 - 400 K/uL  Comprehensive metabolic panel     Status: Abnormal   Collection Time: 05/02/16  1:58 AM  Result Value Ref Range   Sodium 136 135 - 145 mmol/L   Potassium 4.0 3.5 - 5.1 mmol/L   Chloride 106 101 - 111 mmol/L   CO2 25 22 - 32 mmol/L   Glucose, Bld 104 (H) 65 - 99 mg/dL   BUN 19 6 - 20 mg/dL   Creatinine, Ser 0.82 0.61 - 1.24 mg/dL   Calcium 9.2 8.9 - 10.3 mg/dL   Total Protein 6.2 (L) 6.5 - 8.1 g/dL   Albumin 2.1 (L) 3.5 - 5.0 g/dL   AST 191 (H) 15 - 41 U/L   ALT 142 (H) 17 - 63 U/L   Alkaline Phosphatase 678 (H) 38 - 126 U/L   Total Bilirubin 1.0 0.3 - 1.2 mg/dL   GFR calc non Af Amer >60 >60 mL/min   GFR calc Af Amer >60 >60 mL/min    Comment: (NOTE) The eGFR has been calculated using the CKD EPI equation. This calculation has not been validated in all clinical situations. eGFR's persistently <60 mL/min signify possible Chronic Kidney Disease.    Anion gap 5 5 - 15  Troponin I     Status: Abnormal   Collection Time: 05/02/16  7:32 AM  Result Value Ref Range   Troponin I 0.03 (HH) <0.03 ng/mL    Comment: CRITICAL VALUE NOTED.  VALUE IS CONSISTENT WITH PREVIOUSLY REPORTED AND CALLED VALUE.  BASIC METABOLIC PANEL WITH GFR     Status: Abnormal   Collection Time: 05/04/16 11:04 AM  Result Value Ref Range   Sodium 137 135 - 146 mmol/L   Potassium 3.9 3.5 - 5.3 mmol/L   Chloride 102 98 - 110 mmol/L   CO2 24 20 - 31 mmol/L   Glucose, Bld 111 (H) 65 - 99 mg/dL   BUN 24 7 - 25 mg/dL   Creat 1.02 0.70 - 1.11 mg/dL    Comment:   For patients > or = 80 years of age: The upper reference limit for Creatinine is approximately 13% higher for people identified as African-American.      Calcium 10.2 8.6 - 10.3 mg/dL   GFR, Est African American 75 >=60 mL/min   GFR, Est Non African American 65 >=60 mL/min  CBC     Status: Abnormal   Collection Time: 05/04/16 11:04 AM  Result Value Ref  Range   WBC 8.7 3.8 - 10.8 K/uL   RBC 3.46 (L) 4.20 - 5.80 MIL/uL   Hemoglobin 10.4 (L) 13.2 - 17.1 g/dL   HCT 31.2 (L) 38.5 - 50.0 %   MCV 90.2 80.0 - 100.0 fL   MCH 30.1 27.0 - 33.0 pg   MCHC 33.3 32.0 - 36.0 g/dL   RDW 14.7 11.0 - 15.0 %   Platelets 394  140 - 400 K/uL   MPV 9.8 7.5 - 12.5 fL     RADIOLOGY RESULTS: See E-Chart or I-Site for most recent results.  Images and reports are reviewed.  Dg Chest 2 View  Result Date: 04/27/2016 CLINICAL DATA:  Short of breath.  Hypertension.  History of asthma. 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 The cardiac silhouette is top-normal in size. No mediastinal or hilar masses or evidence of adenopathy. Mild linear scarring at the lung bases. Lungs otherwise clear. No pleural effusion or pneumothorax. Left anterior chest wall sequential pacemaker is stable and well positioned. Bony thorax is demineralized but grossly intact. IMPRESSION: No acute cardiopulmonary disease. Stable appearance from the prior study. Electronically Signed   By: Lajean Manes M.D.   On: 04/27/2016 15:31   Ct Abdomen Pelvis W Contrast  Result Date: 04/28/2016 CLINICAL DATA:  Epigastric pain EXAM: CT ABDOMEN AND PELVIS WITH CONTRAST TECHNIQUE: Multidetector CT imaging of the abdomen and pelvis was performed using the standard protocol following bolus administration of intravenous contrast. CONTRAST:  112m ISOVUE-300 IOPAMIDOL (ISOVUE-300) INJECTION 61%, 360mISOVUE-300 IOPAMIDOL (ISOVUE-300) INJECTION 61% COMPARISON:  02/28/2013 FINDINGS: Dependent atelectasis at the lung bases. Marked intra and extrahepatic biliary dilatation is present. There is a soft tissue filling defect in the distal common bile duct. Cholangiocarcinoma is not excluded. Pancreatic ducts are also very dilated. Tiny hypodensity in the right lobe of liver on image 30 is stable There is gallbladder wall thickening. The gallbladder is mildly distended. This  is likely related to biliary obstruction. Spleen is unremarkable Adrenal glands are within normal limits Several benign appearing renal cysts are stable. Sigmoid diverticulosis. No evidence of acute diverticulitis. No obvious mass in the colon. Prominent stool burden within the colon. No evidence of small-bowel obstruction Bladder is unremarkable.  Postoperative changes in the prostate. Atherosclerotic and iliac artery atherosclerotic calcifications. No abnormal retroperitoneal adenopathy Anterolisthesis L5 upon S1. Postoperative changes at this level. These findings are stable. No vertebral compression deformity. IMPRESSION: Findings above are worrisome for biliary obstruction secondary to a mass at the distal common bile duct. Correlation with bilirubin levels is recommended. Gallbladder wall thickening is likely related to biliary obstruction. Electronically Signed   By: ArMarybelle Killings.D.   On: 04/28/2016 22:15   Dg Ercp Biliary & Pancreatic Ducts  Result Date: 04/30/2016 CLINICAL DATA:  Biliary obstruction. EXAM: ERCP TECHNIQUE: Multiple spot images obtained with the fluoroscopic device and submitted for interpretation post-procedure. COMPARISON:  CT of the abdomen on 04/28/2016 FINDINGS: Single image submitted demonstrates some contrast in what appears to be a dilated pancreatic duct. The common bile duct is not confidently visualized. IMPRESSION: ERCP images show contrast in what appears to be a dilated pancreatic duct. These images were submitted for radiologic interpretation only. Please see the procedural report for the amount of contrast and the fluoroscopy time utilized. Electronically Signed   By: GlAletta Edouard.D.   On: 04/30/2016 16:22   Ir Int ExLianne Cureiliary Drain With Cholangiogram  Result Date: 05/01/2016 INDICATION: [age]ear old male with a history of bile duct obstruction, with ampullary mass identified and biopsied on ERCP 04/30/2016 EXAM: IR INT-EXT BILIARY DRAIN W/ CHOLANGIOGRAM  MEDICATIONS: Rocephin scheduled ANESTHESIA/SEDATION: Moderate (conscious) sedation was employed during this procedure. A total of Versed 0.5 mg and Fentanyl 25 mcg was administered intravenously. Moderate Sedation Time: 15 minutes. The patient's level of consciousness and vital signs were monitored continuously by radiology nursing throughout the procedure under my  direct supervision. FLUOROSCOPY TIME:  Fluoroscopy Time: 4 minutes 24 seconds (43 mGy). COMPLICATIONS: None PROCEDURE: The procedure, risks, benefits, and alternatives were explained to the patient and the patient's family. A complete informed consent was performed, with risk benefit analysis. Specific risks that were discussed for the procedure include bleeding, infection, biliary sepsis, IC use day, organ injury, need for further procedure, need for further surgery, long-term drain placement, cardiopulmonary collapse, death. Questions regarding the procedure were encouraged and answered. The patient understands and consents to the procedure. Patient is position in supine position on the fluoroscopy table, and the upper abdomen was prepped and draped in the usual sterile fashion. Maximum barrier sterile technique with sterile gowns and gloves were used for the procedure. A timeout was performed prior to the initiation of the procedure. Local anesthesia was provided with 1% lidocaine with epinephrine. Ultrasound survey of the left liver lobe was performed, with then ultrasound of the right liver lobe. Window into the right biliary system was only present for the right liver approach. 1% lidocaine was used for local anesthesia, with generous infiltration of the skin and subcutaneous tissues in an intercostal location. A Chiba needle was advanced under fluoroscopic guidance into the right liver lobe. Once the tip of the needle was confirmed within the biliary system by injecting small aliquots of contrast, images were stored of the biliary system after  partially opacifying the biliary tree via the needle. Primary access was used for the drain placement. Once the tip of this needle was confirmed within the biliary system, an 018 wire was advanced into the left-sided ducts. The needle was removed, a small incision was made with an 11 blade scalpel, and then a triaxial Accustick system was advanced into the biliary system. The metal stiffener and dilator were removed, we confirmed placement with contrast infusion. In order to opacified the extrahepatic biliary system, the stiffener and knee inner dilator were removed from the Accustick system over the 018 wire, and a hemostatic valve was placed at the hub of the 4 [place] sheath. Contrast injection opacified the common hepatic duct, common bile duct, and the cystic duct as well as partial opacification of left-sided biliary ducts. A sample of bile was aspirated to sent to the lab. The micro wire was removed and a Glidewire was navigated into the extrahepatic biliary ducts. Accustick system was removed from the [place], and 10 French soft tissue dilation was performed on the Glidewire. Kumpe the catheter was then passed over the Glidewire into the common bile duct. The 5 French Kumpe catheter and Glidewire were used to navigate across the obstruction and the ampulla. Wire was removed and contrast injection confirmed location of the catheter within the duodenum. A Bentson wire was placed, Kumpe the catheter was removed, and then a 10 [place] biliary drain was placed as an internal/external biliary drain. Small amount of contrast confirmed location. The patient tolerated the procedure well and remained hemodynamically stable throughout. No complications were encountered and no significant blood loss was encountered. FINDINGS: Mild dilation of the right and left-sided biliary ducts. Internal/external biliary 10 French drain placed via the right-sided ducts. Ampullary obstruction confirmed. The side holes of the biliary  drain are in adequate position to drain the gallbladder, left-sided ducts, and right-sided ducts. IMPRESSION: Status post percutaneous transhepatic cholangiogram with placement of an internal/external biliary drain across ampullary occlusion. Gravity drain was attached to the drainage catheter. Sample of bile was sent to the lab for analysis. Signed, Dulcy Fanny. Earleen Newport, DO [facility] Electronically Signed   By: Corrie Mckusick D.O.   On: 05/01/2016 13:48      ASSESSMENT AND PLAN: No problem-specific Assessment & Plan notes found for this encounter.     Milus Height MD Surgical Oncology, General and [facility]      Visit Diagnoses: No diagnosis found.  Primary Care Physician: Bufford Lope, DO  Other care team ***

## 2016-05-06 ENCOUNTER — Telehealth: Payer: Self-pay | Admitting: Cardiology

## 2016-05-06 NOTE — Telephone Encounter (Signed)
Received records from [facility] for appointment on 06/25/16 with Dr Stanford Breed.  Records given to [facility] (medical records) for Dr Jacalyn Lefevre schedule on 06/25/16.  lp

## 2016-05-10 ENCOUNTER — Other Ambulatory Visit: Payer: Self-pay | Admitting: General Surgery

## 2016-05-11 ENCOUNTER — Other Ambulatory Visit (HOSPITAL_COMMUNITY): Payer: Medicare Other

## 2016-05-12 ENCOUNTER — Telehealth: Payer: Self-pay | Admitting: Family Medicine

## 2016-05-12 NOTE — Telephone Encounter (Signed)
Nurse with [facility]; Pt has a bili drain. She was wondering if it should be flushed periodically. It is doing fine.  If it needs to be flushed, she will need verbal orders to do so. Please advise

## 2016-05-14 ENCOUNTER — Ambulatory Visit (INDEPENDENT_AMBULATORY_CARE_PROVIDER_SITE_OTHER): Payer: Medicare Other | Admitting: Family Medicine

## 2016-05-14 ENCOUNTER — Encounter: Payer: Self-pay | Admitting: Family Medicine

## 2016-05-14 VITALS — BP 154/60 | HR 64 | Temp 98.1°F | Wt 147.0 lb

## 2016-05-14 DIAGNOSIS — E784 Other hyperlipidemia: Secondary | ICD-10-CM | POA: Diagnosis not present

## 2016-05-14 DIAGNOSIS — E785 Hyperlipidemia, unspecified: Secondary | ICD-10-CM

## 2016-05-14 DIAGNOSIS — Z23 Encounter for immunization: Secondary | ICD-10-CM | POA: Diagnosis not present

## 2016-05-14 DIAGNOSIS — K831 Obstruction of bile duct: Secondary | ICD-10-CM | POA: Diagnosis not present

## 2016-05-14 DIAGNOSIS — G4700 Insomnia, unspecified: Secondary | ICD-10-CM

## 2016-05-14 DIAGNOSIS — E440 Moderate protein-calorie malnutrition: Secondary | ICD-10-CM

## 2016-05-14 HISTORY — PX: BILIARY DRAINAGE: SHX1229

## 2016-05-14 LAB — COMPLETE METABOLIC PANEL WITH GFR
ALT: 85 U/L — AB (ref 9–46)
AST: 84 U/L — AB (ref 10–35)
Albumin: 3.2 g/dL — ABNORMAL LOW (ref 3.6–5.1)
Alkaline Phosphatase: 284 U/L — ABNORMAL HIGH (ref 40–115)
BILIRUBIN TOTAL: 0.8 mg/dL (ref 0.2–1.2)
BUN: 42 mg/dL — ABNORMAL HIGH (ref 7–25)
CO2: 21 mmol/L (ref 20–31)
Calcium: 9.7 mg/dL (ref 8.6–10.3)
Chloride: 101 mmol/L (ref 98–110)
Creat: 0.74 mg/dL (ref 0.70–1.11)
GFR, EST NON AFRICAN AMERICAN: 82 mL/min (ref >=60)
GLUCOSE: 100 mg/dL — AB (ref 65–99)
POTASSIUM: 4.6 mmol/L (ref 3.5–5.3)
SODIUM: 132 mmol/L — AB (ref 135–146)
TOTAL PROTEIN: 6.8 g/dL (ref 6.1–8.1)

## 2016-05-14 NOTE — Assessment & Plan Note (Addendum)
Recheck electrolytes today given bili drain can cause hypokalemia. If okay, can restart lasix.  Patient signed release of records for surgery. Will follow up to see plan regarding duodenal mass

## 2016-05-14 NOTE — Patient Instructions (Addendum)
Thank you for being seen today.   We will check a CMP to see how your electrolytes and liver function tests are, if all is normal I will let you know when to restart the lasix and lipitor.   Thank you for signing a release of records so we can help you find out surgery's recommendations.  Please reschedule follow up with podiatry for your toes.  Thank you for getting your flu shot today!

## 2016-05-14 NOTE — Progress Notes (Signed)
    Subjective:  [name] is a 82 y.o. male who presents to the [facility] today for follow up regarding duodenal mass s/p biliary drain  HPI: Duodenal mass: Saw Dr Barry Dienes and was not told what plan was. Was hoping to be told today if undergoing surgery. Is amenable to signing records release. Biliary drain is occasionally catching and tugging causes some discomfort but denies n/v, abd pain. Has poor po intake d/t loss of appetite. Has been taking ensure and a high protein drink from [employer] with 15g protein since cannot find prostat in the area.  Toenails - will reschedule appt at [facility].  HA gone, asymptomatic and finished prednisone taper.  ROS: Per HPI  Objective:  Physical Exam: BP (!) 154/60   Pulse 64   Temp 98.1 F (36.7 C) (Oral)   Wt 147 lb (66.7 kg)   SpO2 94%   BMI 21.09 kg/m   Gen: NAD, resting comfortably CV: RRR with no murmurs appreciated Pulm: NWOB, CTAB with no crackles, wheezes, or rhonchi GI: Normal bowel sounds present. Soft, Nontender, Nondistended. With R internal-external biliary drain exiting RLQ, surrounding area without erythema and not TTP. MSK: no edema, cyanosis, or clubbing noted Skin: warm, dry Neuro: grossly normal, moves all extremities Psych: Normal affect and thought content   Assessment/Plan:  Bile duct obstruction Recheck electrolytes today given bili drain can cause hypokalemia. If okay, can restart lasix.  Patient signed release of records for surgery. Will follow up to see plan regarding duodenal mass  Dyslipidemia (high LDL; low HDL) Checking CMP. If LFTs have resolved on CMP, will be able to restart lipitor  Malnutrition of moderate degree Patient to continue ensure and high protein drink from [employer]. Awaiting decision from surgery regarding surgical mass. Will hold off on nutrition consult for now  Flu vaccine and pneumovax today.  Bufford Lope, DO PGY-1, [facility] 05/14/2016 3:31 PM

## 2016-05-14 NOTE — Assessment & Plan Note (Signed)
Patient to continue ensure and high protein drink from [employer]. Awaiting decision from surgery regarding surgical mass. Will hold off on nutrition consult for now

## 2016-05-14 NOTE — Assessment & Plan Note (Signed)
Checking CMP. If LFTs have resolved on CMP, will be able to restart lipitor

## 2016-05-17 ENCOUNTER — Other Ambulatory Visit: Payer: Self-pay | Admitting: Gastroenterology

## 2016-05-17 ENCOUNTER — Other Ambulatory Visit: Payer: Self-pay | Admitting: General Surgery

## 2016-05-17 DIAGNOSIS — R160 Hepatomegaly, not elsewhere classified: Secondary | ICD-10-CM

## 2016-05-17 NOTE — Progress Notes (Signed)
Patient called concerning his PTC I/E drain that was placed on 9/2 by Dr. Earleen Newport.  He went home and had no follow up ordered.  I have placed an order for an appointment to be made this week in drain clinic so he can have an IOC injection to determine if his drain can be capped or not.  Darrelle Barrell E 11:26 AM 05/17/2016

## 2016-05-18 ENCOUNTER — Telehealth: Payer: Self-pay | Admitting: *Deleted

## 2016-05-18 NOTE — Telephone Encounter (Signed)
-----   Message from Bufford Lope, DO sent at 05/17/2016  3:16 PM EDT ----- Called patient to discuss results and to tell patient to restart home lasix 40mg  qd and lipitor 40mg  daily but was unable to reach patient or leave voicemail at this time.

## 2016-05-18 NOTE — Telephone Encounter (Signed)
Patient is aware of results and also wanted to inform MD that he is going in for imaging on Thursday to [facility] due to his drain not flowing. [name],CMA

## 2016-05-19 ENCOUNTER — Telehealth: Payer: Self-pay

## 2016-05-19 NOTE — Telephone Encounter (Signed)
06/02/16 2 pm appt with [name] pt has been notified

## 2016-05-19 NOTE — Telephone Encounter (Signed)
-----   Message from Milus Banister, MD sent at 05/19/2016  9:30 AM EDT ----- Regarding: RE: Observation No, lets have him put on with extender in early October and they can plan to staff it with me.  Thanks  DJ  ----- Message ----- From: Barron Alvine, RN Sent: 05/19/2016   9:28 AM To: Milus Banister, MD Subject: FW: Observation                                 Do you want me to double book your schedule? ----- Message ----- From: Tania Ade, RN Sent: 05/19/2016   9:13 AM To: Milus Banister, MD, Barron Alvine, RN Subject: Observation                                    [name],  Case discussed in GI Tumor board today. Patient does not want surgery and no cancer diagnosis, so was suggested for Dr. Ardis Hughs to follow him at this point. Maybe see him in October? Sees IR on 9/21 to consider internalizing his drain.  Thanks, Manuela Schwartz

## 2016-05-20 ENCOUNTER — Other Ambulatory Visit (HOSPITAL_COMMUNITY): Payer: Self-pay | Admitting: Interventional Radiology

## 2016-05-20 ENCOUNTER — Ambulatory Visit
Admission: RE | Admit: 2016-05-20 | Discharge: 2016-05-20 | Disposition: A | Discharge status: Home / Self Care | Payer: Medicare Other | Source: Ambulatory Visit | Attending: General Surgery | Admitting: General Surgery

## 2016-05-20 ENCOUNTER — Ambulatory Visit
Admission: RE | Admit: 2016-05-20 | Discharge: 2016-05-20 | Disposition: A | Discharge status: Home / Self Care | Payer: Medicare Other | Source: Ambulatory Visit | Attending: Gastroenterology | Admitting: Gastroenterology

## 2016-05-20 DIAGNOSIS — R160 Hepatomegaly, not elsewhere classified: Secondary | ICD-10-CM

## 2016-05-20 DIAGNOSIS — K3189 Other diseases of stomach and duodenum: Secondary | ICD-10-CM

## 2016-05-20 HISTORY — PX: IR GENERIC HISTORICAL: IMG1180011

## 2016-05-20 NOTE — Progress Notes (Signed)
Patient ID: [name], male   DOB: Sep 25, 1924, 82 y.o.   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 Status post internal/external biliary drain 3 weeks ago. Outpatient follow-up.   Referring Physician(s): Osborne,Kelly  History of Present Illness: [name] is a 82 y.o. male who presented with biliary obstructive jaundice. ERCP performed. Duodenal ampullary obstructing mass was encountered. Endoscopic stent could not be inserted. Biopsy of the lesion demonstrated a tubulovillous adenoma.  Interventional radiology placed a right biliary 10 French internal/external biliary drain to relieve the biliary obstruction. This was performed approximate 3 weeks ago. This continues to be connected to external gravity drainage. No interval fevers. No significant abdominal pain.  Past Medical History:  Diagnosis Date  . Anemia 2014   was on iron pills and then was able to come off  . Arthritis    "all over"  . Asthma       . Atrial flutter ([employer])    a. diagnosed 03/2015  . Bacteremia 03/2016  . Complication of anesthesia    B/P dropped low and had to stay in recovery longer  . Coronary artery disease    a. s/p CABG 1986 b. redo CABG 1993 c. PTCA 2008 d. DES 2010  . Depression       . Diverticulosis   . GERD (gastroesophageal reflux disease)       . Hepatitis    hepatitis- long time ago, occupational contamination   . Hiatal hernia   . Hyperlipemia   . Hypertension       . Insomnia       . Kidney stones   . Myocardial infarction ([place])   . On home oxygen therapy    "2L; just at night" (01/31/2015)  . Peripheral neuropathy ([place])   . Peripheral vascular disease (HCC)    thrombus- in leg- many yrs. ago- ?R- coumadin x1 mth  . Pneumonia 1960's  . Presence of permanent cardiac pacemaker   . Prostate cancer [facility])    radiation therapy- 2002  . Systolic CHF ([place])       . VT (ventricular tachycardia) ([place]) 06/2012   a. in the  setting of STEMI b. on amio during hospitalization    Past Surgical History:  Procedure Laterality Date  . APPENDECTOMY  1943  . CATARACT EXTRACTION W/ INTRAOCULAR LENS  IMPLANT, BILATERAL    . COLONOSCOPY    . CORONARY ARTERY BYPASS GRAFT  1986;  1993   CABG X 5; CABG X 1  . CYSTOSCOPY W/ URETERAL STENT PLACEMENT Right 02/28/2013   Procedure: CYSTOSCOPY WITH RETROGRADE PYELOGRAM/URETERAL STENT PLACEMENT;  Surgeon: Dutch Gray, MD;  Location: [facility];  Service: Urology;  Laterality: Right;  . CYSTOSCOPY WITH URETEROSCOPY AND STENT PLACEMENT Right 03/23/2013   Procedure: RIGHT URETEROSCOPY, LASER LITHO AND STENT PLACEMENT;  Surgeon: Fredricka Bonine, MD;  Location: [facility];  Service: Urology;  Laterality: Right;  . EP IMPLANTABLE DEVICE N/A 04/11/2015   Procedure: Pacemaker Implant;  Surgeon: Will Meredith Leeds, MD;  Location: [place] CV LAB;  Service: Cardiovascular;  Laterality: N/A;  . ERCP N/A 04/30/2016   Procedure: ENDOSCOPIC RETROGRADE CHOLANGIOPANCREATOGRAPHY (ERCP);  Surgeon: Milus Banister, MD;  Location: [place];  Service: Endoscopy;  Laterality: N/A;  . FOOT FUSION Left 2000   heel  . FRACTURE SURGERY     heel- crushed -2002,  (hardware)   . HAMMER TOE SURGERY Right    little toe  . HARDWARE  REMOVAL Right 2002   great toe; Dr. Sharol Given  . HOLMIUM LASER APPLICATION Right [id number]   Procedure: HOLMIUM LASER APPLICATION;  Surgeon: Fredricka Bonine, MD;  Location: [facility];  Service: Urology;  Laterality: Right;  . INGUINAL HERNIA REPAIR Left 01/31/2015   Procedure: OPEN REPAIR RECURRENT LEFT INGUINAL HERNIA;  Surgeon: Fanny Skates, MD;  Location: [place];  Service: General;  Laterality: Left;  . INSERTION OF MESH Left 01/31/2015   Procedure: INSERTION OF MESH;  Surgeon: Fanny Skates, MD;  Location: [employer];  Service: General;  Laterality: Left;  . INTRAMEDULLARY (IM) NAIL INTERTROCHANTERIC Right 08/29/2013   Procedure: INTRAMEDULLARY (IM) NAIL INTERTROCHANTRIC;   Surgeon: Marianna Payment, MD;  Location: [place];  Service: Orthopedics;  Laterality: Right;  . IR GENERIC HISTORICAL  05/01/2016   IR INT EXT BILIARY DRAIN WITH CHOLANGIOGRAM 05/01/2016 Corrie Mckusick, DO [facility]-INTERV RAD  . JOINT REPLACEMENT     R great toe  . KNEE ARTHROSCOPY Right X 2  . LEFT AND RIGHT HEART CATHETERIZATION WITH CORONARY ANGIOGRAM N/A 07/15/2012   Procedure: LEFT AND RIGHT HEART CATHETERIZATION WITH CORONARY ANGIOGRAM;  Surgeon: Jettie Booze, MD;  [facility] CATH LAB;  Service: Cardiovascular;  Laterality: N/A;  . LUMBAR LAMINECTOMY/DECOMPRESSION MICRODISCECTOMY  08/10/2011   Procedure: LUMBAR LAMINECTOMY/DECOMPRESSION MICRODISCECTOMY;  Surgeon: Cooper Render Pool;  Location: [place] NEURO ORS;  Service: Neurosurgery;  Laterality: Right;  RIGHT Lumbar five-sacral one LAMINECTOMY, MICRODISCECTOMY  . LUMBAR WOUND DEBRIDEMENT  08/22/2011   Procedure: LUMBAR WOUND DEBRIDEMENT;  Surgeon: Eustace Moore;  Location: [place] NEURO ORS;  Service: Neurosurgery;  Laterality: N/A;  Repair of CSF Leak requiring laminectomy  . PERCUTANEOUS CORONARY STENT INTERVENTION (PCI-S) N/A 07/15/2012   Procedure: PERCUTANEOUS CORONARY STENT INTERVENTION (PCI-S);  Surgeon: Jettie Booze, MD;  Location: [facility] LAB;  Service: Cardiovascular;  Laterality: N/A;  . RHINOPLASTY  [id number]  . TEE WITHOUT CARDIOVERSION N/A 04/28/2016   Procedure: TRANSESOPHAGEAL ECHOCARDIOGRAM (TEE);  Surgeon: Skeet Latch, MD;  Location: [facility] ENDOSCOPY;  Service: Cardiovascular;  Laterality: N/A;  . TONSILLECTOMY  1940  . TRANSURETHRAL RESECTION OF PROSTATE  x2    Allergies: Review of patient's allergies indicates no known allergies.  Medications: Prior to Admission medications   Medication Sig Start Date End Date Taking? Authorizing Provider  albuterol (PROVENTIL HFA;VENTOLIN HFA) 108 (90 Base) MCG/ACT inhaler Inhale 2 puffs into the lungs every 6 (six) hours as needed for wheezing or shortness of breath. 12/30/15   Mercy Riding, MD  Amino Acids-Protein Hydrolys (FEEDING SUPPLEMENT, PRO-STAT SUGAR FREE 64,) LIQD Take 30 mLs by mouth 2 (two) times daily. Patient not taking: Reported on 05/04/2016 05/02/16   Sela Hilding, MD  aspirin EC 81 MG EC tablet Take 1 tablet (81 mg total) by mouth daily. 04/12/15   Erlene Quan, PA-C  budesonide-formoterol (SYMBICORT) 80-4.5 MCG/ACT inhaler Inhale 2 puffs into the lungs 2 (two) times daily. 09/12/12   Tanda Rockers, MD  Ca Phosphate-Cholecalciferol (CALTRATE GUMMY BITES) 250-400 MG-UNIT CHEW Chew 2 tablets by mouth 2 (two) times daily.    Historical Provider, MD  carbamazepine (TEGRETOL) 100 MG chewable tablet Chew 300 mg by mouth 2 (two) times daily.    Historical Provider, MD  docusate sodium (COLACE) 100 MG capsule Take 100 mg by mouth every evening.     Historical Provider, MD  feeding supplement, ENSURE ENLIVE, (ENSURE ENLIVE) LIQD Take 237 mLs by mouth 2 (two) times daily between meals. 05/02/16   Sela Hilding, MD  finasteride (PROSCAR) 5  MG tablet Take 5 mg by mouth every morning.     Historical Provider, MD  ipratropium (ATROVENT HFA) 17 MCG/ACT inhaler Inhale 2 puffs into the lungs 2 (two) times daily as needed for wheezing.     Historical Provider, MD  ipratropium (ATROVENT) 0.02 % nebulizer solution Take 2.5 mLs (0.5 mg total) by nebulization 2 (two) times daily as needed for wheezing or shortness of breath. 05/02/16   Sela Hilding, MD  isosorbide mononitrate (IMDUR) 30 MG 24 hr tablet Take 1 tablet (30 mg total) by mouth daily. 05/02/16   Sela Hilding, MD  Melatonin 3 MG TABS Take 3 mg by mouth at bedtime.    Historical Provider, MD  metoprolol succinate (TOPROL-XL) 12.5 mg TB24 24 hr tablet Take 12.5 mg by mouth daily.    Historical Provider, MD  Multiple Vitamins-Minerals (MULTIVITAMIN WITH MINERALS) tablet Take 1 tablet by mouth every evening.     Historical Provider, MD  nitroGLYCERIN (NITROSTAT) 0.4 MG SL tablet Place 1 tablet (0.4 mg total) under  the tongue every 5 (five) minutes as needed for chest pain (As needed for chest pain). Patient not taking: Reported on 05/04/2016 05/02/16   Sela Hilding, MD  potassium chloride SA (K-DUR,KLOR-CON) 20 MEQ tablet Take 20 mEq by mouth daily.     Historical Provider, MD  ranitidine (ZANTAC) 150 MG tablet TAKE 1 TABLET BY MOUTH 2 TIMES DAILY. Patient taking differently: Take 150 mg by mouth at bedtime 10/29/15   Darlin Coco, MD  senna (SENOKOT) 8.6 MG tablet Take 1 tablet by mouth daily.     Historical Provider, MD  sertraline (ZOLOFT) 100 MG tablet Take 150 mg by mouth every morning.     Historical Provider, MD  tamsulosin (FLOMAX) 0.4 MG CAPS capsule Take 1 capsule (0.4 mg total) by mouth daily. 09/06/14    N Rumley, DO  traZODone (DESYREL) 50 MG tablet Take 1 tablet (50 mg total) by mouth at bedtime. 05/02/16   Sela Hilding, MD     Family History  Problem Relation Age of Onset  . Heart attack Mother   . Addison's disease Father   . Heart attack Sister   . Anesthesia problems Neg Hx   . Hypotension Neg Hx   . Malignant hyperthermia Neg Hx   . Pseudochol deficiency Neg Hx     Social History   Social History  . Marital status: Married    Spouse name: N/A  . Number of children: N/A  . Years of education: N/A   Social History Main Topics  . Smoking status: Never Smoker  . Smokeless tobacco: Never Used  . Alcohol use No  . Drug use: No  . Sexual activity: Not on file   Other Topics Concern  . Not on file   Social History Narrative  . No narrative on file    Review of Systems: A 12 point ROS discussed and pertinent positives are indicated in the HPI above.  All other systems are negative.  Review of Systems  Vital Signs: BP 135/65   Pulse 66   Temp 98 F (36.7 C)   SpO2 96%   Physical Exam  Constitutional: He is oriented to person, place, and time. He appears well-developed and well-nourished. No distress.  Eyes: Conjunctivae are normal. No scleral  icterus.  Abdominal: Soft. He exhibits no distension. There is no tenderness. There is no guarding.  Right internal and external biliary drain catheter site is clean, dry and intact.  Neurological: He is alert and  oriented to person, place, and time.  Skin: He is not diaphoretic.     Imaging: Dg Chest 2 View  Result Date: 04/27/2016 CLINICAL DATA:  Short of breath.  Hypertension.  History of asthma. 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 The cardiac silhouette is top-normal in size. No mediastinal or hilar masses or evidence of adenopathy. Mild linear scarring at the lung bases. Lungs otherwise clear. No pleural effusion or pneumothorax. Left anterior chest wall sequential pacemaker is stable and well positioned. Bony thorax is demineralized but grossly intact. IMPRESSION: No acute cardiopulmonary disease. Stable appearance from the prior study. Electronically Signed   By: Lajean Manes M.D.   On: 04/27/2016 15:31   Ct Abdomen Pelvis W Contrast  Result Date: 04/28/2016 CLINICAL DATA:  Epigastric pain EXAM: CT ABDOMEN AND PELVIS WITH CONTRAST TECHNIQUE: Multidetector CT imaging of the abdomen and pelvis was performed using the standard protocol following bolus administration of intravenous contrast. CONTRAST:  156mL ISOVUE-300 IOPAMIDOL (ISOVUE-300) INJECTION 61%, 56mL ISOVUE-300 IOPAMIDOL (ISOVUE-300) INJECTION 61% COMPARISON:  02/28/2013 FINDINGS: Dependent atelectasis at the lung bases. Marked intra and extrahepatic biliary dilatation is present. There is a soft tissue filling defect in the distal common bile duct. Cholangiocarcinoma is not excluded. Pancreatic ducts are also very dilated. Tiny hypodensity in the right lobe of liver on image 30 is stable There is gallbladder wall thickening. The gallbladder is mildly distended. This is likely related to biliary obstruction. Spleen is unremarkable Adrenal glands are within normal limits Several  benign appearing renal cysts are stable. Sigmoid diverticulosis. No evidence of acute diverticulitis. No obvious mass in the colon. Prominent stool burden within the colon. No evidence of small-bowel obstruction Bladder is unremarkable.  Postoperative changes in the prostate. Atherosclerotic and iliac artery atherosclerotic calcifications. No abnormal retroperitoneal adenopathy Anterolisthesis L5 upon S1. Postoperative changes at this level. These findings are stable. No vertebral compression deformity. IMPRESSION: Findings above are worrisome for biliary obstruction secondary to a mass at the distal common bile duct. Correlation with bilirubin levels is recommended. Gallbladder wall thickening is likely related to biliary obstruction. Electronically Signed   By: Marybelle Killings M.D.   On: 04/28/2016 22:15   Dg Sinus/fist Tube Chk-non Gi  Result Date: 05/20/2016 CLINICAL DATA:  Duodenum tubulovillous adenoma with distal CBD/ ampulla obstruction. Status post right biliary internal external drain. EXAM: ABSCESS INJECTION CONTRAST:  20 cc Omnipaque 300 FLUOROSCOPY TIME:  Fluoroscopy Time:  42 seconds Radiation Exposure Index (if provided by the fluoroscopic device): 26 dGycm2 Number of Acquired Spot Images: 3 COMPARISON:  05/01/2016 FINDINGS: Contrast injection performed of the existing right 10 [place] internal external biliary drain. Fluoroscopic imaging performed. Drain catheter remain in adequate position. Retention loop formed in the duodenum. There is opacification of the biliary confluence, right and left hepatic ducts, and the common bile duct. Distal CBD obstruction persist. Distal aspect of the drain catheter is also occluded. Contrast does not opacify the duodenum. IMPRESSION: Persistent distal CBD obstruction related to the duodenal tubulovillous adenoma. PLAN: Because of his age and comorbidities, he is a non operative candidate. Patient will be schedule for internal distal CBD wall flex stent insertion  06/01/2016 at [facility]. Electronically Signed   By: Jerilynn Mages.  Eldena Dede M.D.   On: 05/20/2016 11:28   Dg Ercp Biliary & Pancreatic Ducts  Result Date: 04/30/2016 CLINICAL DATA:  Biliary obstruction. EXAM: ERCP TECHNIQUE: Multiple spot images obtained with the fluoroscopic device and submitted for interpretation post-procedure. COMPARISON:  CT of the abdomen on 04/28/2016 FINDINGS: Single image submitted demonstrates some contrast in what appears to be a dilated pancreatic duct. The common bile duct is not confidently visualized. IMPRESSION: ERCP images show contrast in what appears to be a dilated pancreatic duct. These images were submitted for radiologic interpretation only. Please see the procedural report for the amount of contrast and the fluoroscopy time utilized. Electronically Signed   By: Aletta Edouard M.D.   On: 04/30/2016 16:22   Ir Int Lianne Cure Biliary Drain With Cholangiogram  Result Date: 05/01/2016 INDICATION: 82 year old male with a history of bile duct obstruction, with ampullary mass identified and biopsied on ERCP 04/30/2016 EXAM: IR INT-EXT BILIARY DRAIN W/ CHOLANGIOGRAM MEDICATIONS: Rocephin scheduled ANESTHESIA/SEDATION: Moderate (conscious) sedation was employed during this procedure. A total of Versed 0.5 mg and Fentanyl 25 mcg was administered intravenously. Moderate Sedation Time: 15 minutes. The patient's level of consciousness and vital signs were monitored continuously by radiology nursing throughout the procedure under my direct supervision. FLUOROSCOPY TIME:  Fluoroscopy Time: 4 minutes 24 seconds (43 mGy). COMPLICATIONS: None PROCEDURE: The procedure, risks, benefits, and alternatives were explained to the patient and the patient's family. A complete informed consent was performed, with risk benefit analysis. Specific risks that were discussed for the procedure include bleeding, infection, biliary sepsis, IC use day, organ injury, need for further procedure, need for further  surgery, long-term drain placement, cardiopulmonary collapse, death. Questions regarding the procedure were encouraged and answered. The patient understands and consents to the procedure. Patient is position in supine position on the fluoroscopy table, and the upper abdomen was prepped and draped in the usual sterile fashion. Maximum barrier sterile technique with sterile gowns and gloves were used for the procedure. A timeout was performed prior to the initiation of the procedure. Local anesthesia was provided with 1% lidocaine with epinephrine. Ultrasound survey of the left liver lobe was performed, with then ultrasound of the right liver lobe. Window into the right biliary system was only present for the right liver approach. 1% lidocaine was used for local anesthesia, with generous infiltration of the skin and subcutaneous tissues in an intercostal location. A Chiba needle was advanced under fluoroscopic guidance into the right liver lobe. Once the tip of the needle was confirmed within the biliary system by injecting small aliquots of contrast, images were stored of the biliary system after partially opacifying the biliary tree via the needle. Primary access was used for the drain placement. Once the tip of this needle was confirmed within the biliary system, an 018 wire was advanced into the left-sided ducts. The needle was removed, a small incision was made with an 11 blade scalpel, and then a triaxial Accustick system was advanced into the biliary system. The metal stiffener and dilator were removed, we confirmed placement with contrast infusion. In order to opacified the extrahepatic biliary system, the stiffener and knee inner dilator were removed from the Accustick system over the 018 wire, and a hemostatic valve was placed at the hub of the 4 [place] sheath. Contrast injection opacified the common hepatic duct, common bile duct, and the cystic duct as well as partial opacification of left-sided biliary  ducts. A sample of bile was aspirated to sent to the lab. The micro wire was removed and a Glidewire was navigated into the extrahepatic biliary ducts. Accustick system was removed from the [place], and 10 French soft tissue dilation was performed on the Glidewire. Kumpe the catheter was then passed over the Glidewire into the common  bile duct. The 5 French Kumpe catheter and Glidewire were used to navigate across the obstruction and the ampulla. Wire was removed and contrast injection confirmed location of the catheter within the duodenum. A Bentson wire was placed, Kumpe the catheter was removed, and then a 10 [place] biliary drain was placed as an internal/external biliary drain. Small amount of contrast confirmed location. The patient tolerated the procedure well and remained hemodynamically stable throughout. No complications were encountered and no significant blood loss was encountered. FINDINGS: Mild dilation of the right and left-sided biliary ducts. Internal/external biliary 10 French drain placed via the right-sided ducts. Ampullary obstruction confirmed. The side holes of the biliary drain are in adequate position to drain the gallbladder, left-sided ducts, and right-sided ducts. IMPRESSION: Status post percutaneous transhepatic cholangiogram with placement of an internal/external biliary drain across ampullary occlusion. Gravity drain was attached to the drainage catheter. Sample of bile was sent to the lab for analysis. Signed, Dulcy Fanny. Earleen Newport, DO [facility] Electronically Signed   By: Corrie Mckusick D.O.   On: 05/01/2016 13:48    Labs:  CBC:  Recent Labs  04/30/16 0242 05/01/16 0221 05/02/16 0158 05/04/16 1104  WBC 8.1 7.1 6.8 8.7  HGB 9.9* 9.1* 9.1* 10.4*  HCT 30.9* 28.7* 28.6* 31.2*  PLT 256 246 263 394    COAGS:  Recent Labs  05/01/16 0221  INR 1.18    BMP:  Recent Labs  05/01/16 0221 05/02/16 0158  05/04/16 1104 05/14/16 1545  NA 137 136 137 132*  K 3.7 4.0 3.9 4.6  CL 105 106 102 101  CO2 24 25 24 21   GLUCOSE 79 104* 111* 100*  BUN 20 19 24  42*  CALCIUM 9.3 9.2 10.2 9.7  CREATININE 0.76 0.82 1.02 0.74  GFRNONAA >60 >60 65 82  GFRAA >60 >60 75 >89    LIVER FUNCTION TESTS:  Recent Labs  04/29/16 0802 05/01/16 0221 05/02/16 0158 05/14/16 1545  BILITOT 2.5* 1.5* 1.0 0.8  AST 201* 193* 191* 84*  ALT 129* 128* 142* 85*  ALKPHOS 783* 713* 678* 284*  PROT 6.9 6.0* 6.2* 6.8  ALBUMIN 2.3* 2.0* 2.1* 3.2*    TUMOR MARKERS:  Recent Labs  04/30/16 0242  CA199 91*    Assessment and Plan:  Biliary obstruction secondary to a duodenal/ampullary tubulovillous adenoma. Unsuccessful endoscopic stent insertion. Patient subsequently had a percutaneous transhepatic internal/external biliary drain placed. Drain injection today demonstrates partial occlusion of the catheter. Distal CBD obstruction persist. Contrast does not spontaneously drain into the duodenum.  Plan: Schedule for stent internalization across the distal CBD obstruction with a wall flex stent. This was discussed and reviewed at GI conference yesterday. This will be scheduled for early October. Therefore the external drain can be removed.  Thank you for this interesting consult.  I greatly enjoyed meeting Joy A Hickok and look forward to participating in their care.  A copy of this report was sent to the requesting provider on this date.  Electronically Signed: Greggory Keen 05/20/2016, 1:26 PM   I spent a total of  30 Minutes   in face to face in clinical consultation, greater than 50% of which was counseling/coordinating care for biliary obstruction and a transhepatic internal/external drain.

## 2016-05-24 ENCOUNTER — Telehealth: Payer: Self-pay | Admitting: Internal Medicine

## 2016-05-24 NOTE — Telephone Encounter (Signed)
Received call from [facility] of [place]. She wanted to update primary team that Mr. Crew had a fever to 76 F over the weekend. His temperature was 99 F when checked today. BP was in 110s/50 but runs low per his [place]. Nurse was more concerned about increased RR of 22-24 in setting of upcoming planned IR procedure. She does think he had been overdoing it this weekend with family around and may be in some discomfort. Patient had instrumentation of biliary drain 9/21. He was treated for Klebsiella bacteremia last month and completed treatment earlier this month. Suspect fevers could be secondary to instrumentation, especially as seem to be resolving.   Will send message to IR physician Dr. Annamaria Boots to make sure no additional labwork needed prior to internal stent placement scheduled for 06/01/16. If so, will send message to [facility] nurse at [phone] with verbal orders.

## 2016-05-25 ENCOUNTER — Telehealth: Payer: Self-pay | Admitting: Gastroenterology

## 2016-05-25 ENCOUNTER — Emergency Department (HOSPITAL_COMMUNITY): Payer: Medicare Other

## 2016-05-25 ENCOUNTER — Encounter (HOSPITAL_COMMUNITY): Payer: Self-pay

## 2016-05-25 ENCOUNTER — Inpatient Hospital Stay (HOSPITAL_COMMUNITY)
Admission: EM | Admit: 2016-05-25 | Discharge: 2016-05-28 | DRG: 919 | Disposition: A | Discharge status: Home Health | Payer: Medicare Other | Attending: Family Medicine | Admitting: Family Medicine

## 2016-05-25 DIAGNOSIS — I5022 Chronic systolic (congestive) heart failure: Secondary | ICD-10-CM

## 2016-05-25 DIAGNOSIS — I739 Peripheral vascular disease, unspecified: Secondary | ICD-10-CM | POA: Diagnosis present

## 2016-05-25 DIAGNOSIS — K219 Gastro-esophageal reflux disease without esophagitis: Secondary | ICD-10-CM | POA: Diagnosis present

## 2016-05-25 DIAGNOSIS — J4520 Mild intermittent asthma, uncomplicated: Secondary | ICD-10-CM | POA: Diagnosis present

## 2016-05-25 DIAGNOSIS — I110 Hypertensive heart disease with heart failure: Secondary | ICD-10-CM | POA: Diagnosis present

## 2016-05-25 DIAGNOSIS — I252 Old myocardial infarction: Secondary | ICD-10-CM

## 2016-05-25 DIAGNOSIS — T85528S Displacement of other gastrointestinal prosthetic devices, implants and grafts, sequela: Secondary | ICD-10-CM

## 2016-05-25 DIAGNOSIS — I2510 Atherosclerotic heart disease of native coronary artery without angina pectoris: Secondary | ICD-10-CM | POA: Diagnosis present

## 2016-05-25 DIAGNOSIS — T8579XA Infection and inflammatory reaction due to other internal prosthetic devices, implants and grafts, initial encounter: Secondary | ICD-10-CM | POA: Diagnosis not present

## 2016-05-25 DIAGNOSIS — J42 Unspecified chronic bronchitis: Secondary | ICD-10-CM

## 2016-05-25 DIAGNOSIS — Z7982 Long term (current) use of aspirin: Secondary | ICD-10-CM

## 2016-05-25 DIAGNOSIS — Z87442 Personal history of urinary calculi: Secondary | ICD-10-CM

## 2016-05-25 DIAGNOSIS — I4892 Unspecified atrial flutter: Secondary | ICD-10-CM | POA: Diagnosis present

## 2016-05-25 DIAGNOSIS — M810 Age-related osteoporosis without current pathological fracture: Secondary | ICD-10-CM | POA: Diagnosis present

## 2016-05-25 DIAGNOSIS — Z8674 Personal history of sudden cardiac arrest: Secondary | ICD-10-CM

## 2016-05-25 DIAGNOSIS — Y838 Other surgical procedures as the cause of abnormal reaction of the patient, or of later complication, without mention of misadventure at the time of the procedure: Secondary | ICD-10-CM | POA: Diagnosis present

## 2016-05-25 DIAGNOSIS — Z961 Presence of intraocular lens: Secondary | ICD-10-CM | POA: Diagnosis present

## 2016-05-25 DIAGNOSIS — R338 Other retention of urine: Secondary | ICD-10-CM | POA: Diagnosis present

## 2016-05-25 DIAGNOSIS — Z9841 Cataract extraction status, right eye: Secondary | ICD-10-CM

## 2016-05-25 DIAGNOSIS — J449 Chronic obstructive pulmonary disease, unspecified: Secondary | ICD-10-CM | POA: Diagnosis present

## 2016-05-25 DIAGNOSIS — K838 Other specified diseases of biliary tract: Secondary | ICD-10-CM

## 2016-05-25 DIAGNOSIS — J9610 Chronic respiratory failure, unspecified whether with hypoxia or hypercapnia: Secondary | ICD-10-CM | POA: Diagnosis present

## 2016-05-25 DIAGNOSIS — Z8701 Personal history of pneumonia (recurrent): Secondary | ICD-10-CM

## 2016-05-25 DIAGNOSIS — Z8546 Personal history of malignant neoplasm of prostate: Secondary | ICD-10-CM

## 2016-05-25 DIAGNOSIS — Z8249 Family history of ischemic heart disease and other diseases of the circulatory system: Secondary | ICD-10-CM

## 2016-05-25 DIAGNOSIS — T85520A Displacement of bile duct prosthesis, initial encounter: Secondary | ICD-10-CM

## 2016-05-25 DIAGNOSIS — R509 Fever, unspecified: Secondary | ICD-10-CM | POA: Diagnosis not present

## 2016-05-25 DIAGNOSIS — E785 Hyperlipidemia, unspecified: Secondary | ICD-10-CM | POA: Diagnosis present

## 2016-05-25 DIAGNOSIS — G629 Polyneuropathy, unspecified: Secondary | ICD-10-CM | POA: Diagnosis present

## 2016-05-25 DIAGNOSIS — F329 Major depressive disorder, single episode, unspecified: Secondary | ICD-10-CM | POA: Diagnosis present

## 2016-05-25 DIAGNOSIS — K831 Obstruction of bile duct: Secondary | ICD-10-CM | POA: Diagnosis present

## 2016-05-25 DIAGNOSIS — D649 Anemia, unspecified: Secondary | ICD-10-CM | POA: Diagnosis present

## 2016-05-25 DIAGNOSIS — Z9842 Cataract extraction status, left eye: Secondary | ICD-10-CM

## 2016-05-25 DIAGNOSIS — Z951 Presence of aortocoronary bypass graft: Secondary | ICD-10-CM

## 2016-05-25 DIAGNOSIS — Z9049 Acquired absence of other specified parts of digestive tract: Secondary | ICD-10-CM

## 2016-05-25 DIAGNOSIS — Z79899 Other long term (current) drug therapy: Secondary | ICD-10-CM

## 2016-05-25 DIAGNOSIS — N401 Enlarged prostate with lower urinary tract symptoms: Secondary | ICD-10-CM | POA: Diagnosis present

## 2016-05-25 DIAGNOSIS — I2583 Coronary atherosclerosis due to lipid rich plaque: Secondary | ICD-10-CM | POA: Diagnosis present

## 2016-05-25 DIAGNOSIS — J9811 Atelectasis: Secondary | ICD-10-CM | POA: Diagnosis present

## 2016-05-25 DIAGNOSIS — Z7951 Long term (current) use of inhaled steroids: Secondary | ICD-10-CM

## 2016-05-25 DIAGNOSIS — Z9981 Dependence on supplemental oxygen: Secondary | ICD-10-CM

## 2016-05-25 LAB — COMPREHENSIVE METABOLIC PANEL
ALBUMIN: 2.5 g/dL — AB (ref 3.5–5.0)
ALT: 38 U/L (ref 17–63)
ANION GAP: 9 (ref 5–15)
AST: 42 U/L — ABNORMAL HIGH (ref 15–41)
Alkaline Phosphatase: 227 U/L — ABNORMAL HIGH (ref 38–126)
BUN: 30 mg/dL — ABNORMAL HIGH (ref 6–20)
CO2: 26 mmol/L (ref 22–32)
Calcium: 9.7 mg/dL (ref 8.9–10.3)
Chloride: 99 mmol/L — ABNORMAL LOW (ref 101–111)
Creatinine, Ser: 0.89 mg/dL (ref 0.61–1.24)
GFR calc Af Amer: >60 mL/min (ref >60)
GFR calc non Af Amer: >60 mL/min (ref >60)
GLUCOSE: 115 mg/dL — AB (ref 65–99)
POTASSIUM: 4.2 mmol/L (ref 3.5–5.1)
SODIUM: 134 mmol/L — AB (ref 135–145)
Total Bilirubin: 0.5 mg/dL (ref 0.3–1.2)
Total Protein: 7.3 g/dL (ref 6.5–8.1)

## 2016-05-25 LAB — URINALYSIS, ROUTINE W REFLEX MICROSCOPIC
Bilirubin Urine: NEGATIVE
Glucose, UA: NEGATIVE mg/dL
Hgb urine dipstick: NEGATIVE
Ketones, ur: NEGATIVE mg/dL
Leukocytes, UA: NEGATIVE
Nitrite: NEGATIVE
Protein, ur: NEGATIVE mg/dL
Specific Gravity, Urine: 1.023 (ref 1.005–1.030)
pH: 5.5 (ref 5.0–8.0)

## 2016-05-25 LAB — CBC WITH DIFFERENTIAL/PLATELET
Basophils Absolute: 0 10*3/uL (ref 0.0–0.1)
Basophils Relative: 0 %
Eosinophils Absolute: 0.1 10*3/uL (ref 0.0–0.7)
Eosinophils Relative: 1 %
HEMATOCRIT: 31.2 % — AB (ref 39.0–52.0)
HEMOGLOBIN: 9.9 g/dL — AB (ref 13.0–17.0)
LYMPHS ABS: 2.4 10*3/uL (ref 0.7–4.0)
Lymphocytes Relative: 24 %
MCH: 29.6 pg (ref 26.0–34.0)
MCHC: 31.7 g/dL (ref 30.0–36.0)
MCV: 93.1 fL (ref 78.0–100.0)
MONOS PCT: 10 %
Monocytes Absolute: 1 10*3/uL (ref 0.1–1.0)
NEUTROS ABS: 6.6 10*3/uL (ref 1.7–7.7)
NEUTROS PCT: 65 %
Platelets: 331 10*3/uL (ref 150–400)
RBC: 3.35 MIL/uL — ABNORMAL LOW (ref 4.22–5.81)
RDW: 14.6 % (ref 11.5–15.5)
WBC: 10.1 10*3/uL (ref 4.0–10.5)

## 2016-05-25 LAB — I-STAT CG4 LACTIC ACID, ED
Lactic Acid, Venous: 1.05 mmol/L (ref 0.5–1.9)
Lactic Acid, Venous: 1.74 mmol/L (ref 0.5–1.9)

## 2016-05-25 MED ORDER — IOPAMIDOL (ISOVUE-300) INJECTION 61%
100.0000 mL | Freq: Once | INTRAVENOUS | Status: AC | PRN
  Administered 2016-05-25: 100 mL via INTRAVENOUS

## 2016-05-25 MED ORDER — MORPHINE SULFATE (PF) 4 MG/ML IV SOLN
4.0000 mg | Freq: Once | INTRAVENOUS | Status: AC
  Administered 2016-05-25: 4 mg via INTRAVENOUS
  Filled 2016-05-25: qty 1

## 2016-05-25 NOTE — ED Notes (Signed)
[name], son contact info, [phone]

## 2016-05-25 NOTE — ED Notes (Signed)
Pt family asked if he could have some water was informed that he could not have anything till after his CT

## 2016-05-25 NOTE — ED Notes (Signed)
Patient transported to CT 

## 2016-05-25 NOTE — ED Provider Notes (Signed)
[facility] DEPT Provider Note   CSN: [id number] Arrival date & time: 05/25/16  1629     History   Chief Complaint Chief Complaint  Patient presents with  . Wound Check    HPI [name] is a 82 y.o. male with a past medical history of CAD, CHF, presents to the ED today complaining of fever. 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 ERCP and internal stent was attempted but unfortunately failed so an internal/extrenal biliary drain was placed by IR 3 weeks ago. Patient states that he had been otherwise doing well until this past week when he began having intermittent fevers up to 102 at home. He has been taking Tylenol for this with minimal relief. He also reports increased fatigue and weakness. In addition he has been having increased pain around the drain site. He had the drain and to being replaced last Thursday by IR. Today he also noticed that the drainage is darker than normal and has a foul odor. Patient also reports decreased appetite and body chills. He denies any vomiting, diarrhea, dysuria.  HPI  Past Medical History:  Diagnosis Date  . Anemia 2014   was on iron pills and then was able to come off  . Arthritis    "all over"  . Asthma       . Atrial flutter ([place])    a. diagnosed 03/2015  . Bacteremia 03/2016  . Complication of anesthesia    B/P dropped low and had to stay in recovery longer  . Coronary artery disease    a. s/p CABG 1986 b. redo CABG 1993 c. PTCA 2008 d. DES 2010  . Depression       . Diverticulosis   . GERD (gastroesophageal reflux disease)       . Hepatitis    hepatitis- long time ago, occupational contamination   . Hiatal hernia   . Hyperlipemia   . Hypertension       . Insomnia       . Kidney stones   . Myocardial infarction ([place])   . On home oxygen therapy    "2L; just at night" (01/31/2015)  . Peripheral neuropathy ([place])   . Peripheral  vascular disease (HCC)    thrombus- in leg- many yrs. ago- ?R- coumadin x1 mth  . Pneumonia 1960's  . Presence of permanent cardiac pacemaker   . Prostate cancer [facility])    radiation therapy- 2002  . Systolic CHF ([place])       . VT (ventricular tachycardia) ([place]) 06/2012   a. in the setting of STEMI b. on amio during hospitalization    Patient Active Problem List   Diagnosis Date Noted  . 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 Cholangitis   . Peri-ampullary neoplasm   . Malnutrition of moderate degree 04/29/2016  . Bile duct obstruction   . Elevated liver function tests   . Bacteremia due to Gram-negative bacteria ([place]) 04/27/2016  . BPH (benign prostatic hyperplasia)   . Asthma, mild intermittent   . Insomnia 02/18/2016  . DOE (dyspnea on exertion)   . Pacemaker 12/25/2015  . Atrial flutter, RVR 04/10/2015  . Unstable angina ([place]) 04/10/2015  . Coronary artery disease involving native coronary artery of native heart without angina pectoris 04/10/2015  . Urinary retention due to benign prostatic hyperplasia   . Systolic CHF ([place]) [id number]  . Depression 09/26/2013  . Intertrochanteric fracture of right hip ([place])  08/29/2013  . Hypertension   . GERD (gastroesophageal reflux disease)   . COPD (chronic obstructive pulmonary disease) ([place])   . H/O cardiac arrest   . Junctional bradycardia 07/25/2012  . Glucose intolerance (impaired glucose tolerance) 07/19/2012  . Normocytic anemia 07/19/2012  . Acute MI, anterolateral wall ([place]) 07/16/2012  . Prostatitis 07/16/2012  . E. coli UTI (urinary tract infection) 07/16/2012  . Elevated transaminase level 07/16/2012  . Foot drop, right 07/28/2011  . LBBB (left bundle branch block) 03/29/2011  . Hx of CABG and re-do CABG  03/29/2011  . Ischemic heart disease   . Myocardial infarction ([place])   . Acute systolic congestive heart failure, NYHA class 2-EF 40-45%   . Chronic respiratory failure ([place])   . Chronic asthma   . Peripheral neuropathy  ([place])   . Dyslipidemia (high LDL; low HDL)   . Essential hypertension 04/04/2007  . PSORIASIS 04/04/2007  . OSTEOARTHRITIS 04/04/2007  . OSTEOPOROSIS 04/04/2007  . Coronary artery disease due to lipid rich plaque 01/05/2007  . HERNIA 01/05/2007    Past Surgical History:  Procedure Laterality Date  . APPENDECTOMY  1943  . CATARACT EXTRACTION W/ INTRAOCULAR LENS  IMPLANT, BILATERAL    . COLONOSCOPY    . CORONARY ARTERY BYPASS GRAFT  1986;  1993   CABG X 5; CABG X 1  . CYSTOSCOPY W/ URETERAL STENT PLACEMENT Right 02/28/2013   Procedure: CYSTOSCOPY WITH RETROGRADE PYELOGRAM/URETERAL STENT PLACEMENT;  Surgeon: Dutch Gray, MD;  Location: [facility];  Service: Urology;  Laterality: Right;  . CYSTOSCOPY WITH URETEROSCOPY AND STENT PLACEMENT Right 03/23/2013   Procedure: RIGHT URETEROSCOPY, LASER LITHO AND STENT PLACEMENT;  Surgeon: Fredricka Bonine, MD;  Location: [facility];  Service: Urology;  Laterality: Right;  . EP IMPLANTABLE DEVICE N/A 04/11/2015   Procedure: Pacemaker Implant;  Surgeon: Will Meredith Leeds, MD;  Location: [facility] CV LAB;  Service: Cardiovascular;  Laterality: N/A;  . ERCP N/A 04/30/2016   Procedure: ENDOSCOPIC RETROGRADE CHOLANGIOPANCREATOGRAPHY (ERCP);  Surgeon: Milus Banister, MD;  Location: [facility];  Service: Endoscopy;  Laterality: N/A;  . FOOT FUSION Left 2000   heel  . FRACTURE SURGERY     heel- crushed -2002,  (hardware)   . HAMMER TOE SURGERY Right    little toe  . HARDWARE REMOVAL Right 2002   great toe; Dr. Sharol Given  . HOLMIUM LASER APPLICATION Right [id number]   Procedure: HOLMIUM LASER APPLICATION;  Surgeon: Fredricka Bonine, MD;  Location: [facility];  Service: Urology;  Laterality: Right;  . INGUINAL HERNIA REPAIR Left 01/31/2015   Procedure: OPEN REPAIR RECURRENT LEFT INGUINAL HERNIA;  Surgeon: Fanny Skates, MD;  Location: [facility];  Service: General;  Laterality: Left;  . INSERTION OF MESH Left 01/31/2015   Procedure: INSERTION OF MESH;  Surgeon:  Fanny Skates, MD;  Location: [facility];  Service: General;  Laterality: Left;  . INTRAMEDULLARY (IM) NAIL INTERTROCHANTERIC Right 08/29/2013   Procedure: INTRAMEDULLARY (IM) NAIL INTERTROCHANTRIC;  Surgeon: Marianna Payment, MD;  Location: [facility];  Service: Orthopedics;  Laterality: Right;  . IR GENERIC HISTORICAL  05/01/2016   IR INT EXT BILIARY DRAIN WITH CHOLANGIOGRAM 05/01/2016 Corrie Mckusick, DO [facility]-INTERV RAD  . JOINT REPLACEMENT     R great toe  . KNEE ARTHROSCOPY Right X 2  . LEFT AND RIGHT HEART CATHETERIZATION WITH CORONARY ANGIOGRAM N/A 07/15/2012   Procedure: LEFT AND RIGHT HEART CATHETERIZATION WITH CORONARY ANGIOGRAM;  Surgeon: Jettie Booze, MD;  Location: [facility] CATH LAB;  Service: Cardiovascular;  Laterality: N/A;  . LUMBAR LAMINECTOMY/DECOMPRESSION MICRODISCECTOMY  08/10/2011   Procedure: LUMBAR LAMINECTOMY/DECOMPRESSION MICRODISCECTOMY;  Surgeon: Cooper Render Pool;  Location: [place] [facility]O [facility];  Service: Neurosurgery;  Laterality: Right;  RIGHT Lumbar five-sacral one LAMINECTOMY, MICRODISCECTOMY  . LUMBAR WOUND DEBRIDEMENT  08/22/2011   Procedure: LUMBAR WOUND DEBRIDEMENT;  Surgeon: Eustace Moore;  Location: [place] NEURO ORS;  Service: Neurosurgery;  Laterality: N/A;  Repair of CSF Leak requiring laminectomy  . PERCUTANEOUS CORONARY STENT INTERVENTION (PCI-S) N/A 07/15/2012   Procedure: PERCUTANEOUS CORONARY STENT INTERVENTION (PCI-S);  Surgeon: Jettie Booze, MD;  Location: [facility] CATH LAB;  Service: Cardiovascular;  Laterality: N/A;  . RHINOPLASTY  [id number]  . TEE WITHOUT CARDIOVERSION N/A 04/28/2016   Procedure: TRANSESOPHAGEAL ECHOCARDIOGRAM (TEE);  Surgeon: Skeet Latch, MD;  Location: [facility] ENDOSCOPY;  Service: Cardiovascular;  Laterality: N/A;  . TONSILLECTOMY  1940  . TRANSURETHRAL RESECTION OF PROSTATE  x2       Home Medications    Prior to Admission medications   Medication Sig Start Date End Date Taking? Authorizing Provider  acetaminophen (TYLENOL) 500 MG tablet Take 1,000  mg by mouth every 6 (six) hours as needed for fever.   Yes Historical Provider, MD  albuterol (PROVENTIL HFA;VENTOLIN HFA) 108 (90 Base) MCG/ACT inhaler Inhale 2 puffs into the lungs every 6 (six) hours as needed for wheezing or shortness of breath. 12/30/15  Yes Mercy Riding, MD  aspirin EC 81 MG EC tablet Take 1 tablet (81 mg total) by mouth daily. 04/12/15  Yes Luke K Kilroy, PA-C  budesonide-formoterol (SYMBICORT) 80-4.5 MCG/ACT inhaler Inhale 2 puffs into the lungs 2 (two) times daily. 09/12/12  Yes Tanda Rockers, MD  Ca Phosphate-Cholecalciferol (CALTRATE GUMMY BITES) 250-400 MG-UNIT CHEW Chew 2 tablets by mouth 2 (two) times daily.   Yes Historical Provider, MD  carbamazepine (TEGRETOL) 100 MG chewable tablet Chew 300 mg by mouth 2 (two) times daily.   Yes Historical Provider, MD  docusate sodium (COLACE) 100 MG capsule Take 100 mg by mouth every evening.    Yes Historical Provider, MD  feeding supplement, ENSURE ENLIVE, (ENSURE ENLIVE) LIQD Take 237 mLs by mouth 2 (two) times daily between meals. 05/02/16  Yes Sela Hilding, MD  finasteride (PROSCAR) 5 MG tablet Take 5 mg by mouth every morning.    Yes Historical Provider, MD  ipratropium (ATROVENT HFA) 17 MCG/ACT inhaler Inhale 2 puffs into the lungs 2 (two) times daily as needed for wheezing.    Yes Historical Provider, MD  ipratropium (ATROVENT) 0.02 % nebulizer solution Take 2.5 mLs (0.5 mg total) by nebulization 2 (two) times daily as needed for wheezing or shortness of breath. 05/02/16  Yes Sela Hilding, MD  isosorbide mononitrate (IMDUR) 30 MG 24 hr tablet Take 1 tablet (30 mg total) by mouth daily. 05/02/16  Yes Sela Hilding, MD  Melatonin 3 MG TABS Take 3 mg by mouth at bedtime.   Yes Historical Provider, MD  metoprolol succinate (TOPROL-XL) 12.5 mg TB24 24 hr tablet Take 12.5 mg by mouth daily.   Yes Historical Provider, MD  Multiple Vitamins-Minerals (MULTIVITAMIN WITH MINERALS) tablet Take 1 tablet by mouth every evening.     Yes Historical Provider, MD  potassium chloride SA (K-DUR,KLOR-CON) 20 MEQ tablet Take 20 mEq by mouth daily.    Yes Historical Provider, MD  ranitidine (ZANTAC) 150 MG tablet TAKE 1 TABLET BY MOUTH 2 TIMES DAILY. Patient taking differently: Take 300 mg by mouth at bedtime 10/29/15  Yes Darlin Coco, MD  senna [facility])  8.6 MG tablet Take 1 tablet by mouth daily.    Yes Historical Provider, MD  sertraline (ZOLOFT) 100 MG tablet Take 150 mg by mouth every morning.    Yes Historical Provider, MD  tamsulosin (FLOMAX) 0.4 MG CAPS capsule Take 1 capsule (0.4 mg total) by mouth daily. 09/06/14  Yes [place], DO  traZODone (DESYREL) 50 MG tablet Take 1 tablet (50 mg total) by mouth at bedtime. 05/02/16  Yes Sela Hilding, MD  Amino Acids-Protein Hydrolys (FEEDING SUPPLEMENT, PRO-STAT SUGAR FREE 64,) LIQD Take 30 mLs by mouth 2 (two) times daily. Patient not taking: Reported on 05/25/2016 05/02/16   Sela Hilding, MD  nitroGLYCERIN (NITROSTAT) 0.4 MG SL tablet Place 1 tablet (0.4 mg total) under the tongue every 5 (five) minutes as needed for chest pain (As needed for chest pain). 05/02/16   Sela Hilding, MD    Family History Family History  Problem Relation Age of Onset  . Heart attack Mother   . Addison's disease Father   . Heart attack Sister   . Anesthesia problems Neg Hx   . Hypotension Neg Hx   . Malignant hyperthermia Neg Hx   . Pseudochol deficiency Neg Hx     Social History Social History  Substance Use Topics  . Smoking status: Never Smoker  . Smokeless tobacco: Never Used  . Alcohol use No     Allergies   Review of patient's allergies indicates no known allergies.   Review of Systems Review of Systems  All other systems reviewed and are negative.    Physical Exam Updated Vital Signs BP 164/73 (BP Location: Right Arm)   Pulse 80   Temp 100.8 F (38.2 C) (Rectal)   Resp 23   Ht 5\' 10"  (1.778 m)   Wt 69.9 kg   SpO2 98%   BMI 22.10 kg/m    Physical Exam  Constitutional: He is oriented to person, place, and time. No distress.  Elderly  HENT:  Head: Normocephalic and atraumatic.  Mouth/Throat: No oropharyngeal exudate.  Eyes: Conjunctivae and EOM are normal. Pupils are equal, round, and reactive to light. Right eye exhibits no discharge. Left eye exhibits no discharge. No scleral icterus.  Cardiovascular: Normal rate, regular rhythm, normal heart sounds and intact distal pulses.  Exam reveals no gallop and no friction rub.   No murmur heard. Pulmonary/Chest: Effort normal and breath sounds normal. No respiratory distress. He has no wheezes. He has no rales. He exhibits no tenderness.  Abdominal: Soft. Bowel sounds are normal. He exhibits distension ( around drain insertion). There is no tenderness. There is no guarding.  Musculoskeletal: Normal range of motion. He exhibits no edema.  Neurological: He is alert and oriented to person, place, and time.  Strength 5/5 throughout. No sensory deficits.   Skin: Skin is warm and dry. No rash noted. He is not diaphoretic. No erythema. No pallor.  Psychiatric: He has a normal mood and affect. His behavior is normal.  Nursing note and vitals reviewed.    ED Treatments / Results  Labs (all labs ordered are listed, but only abnormal results are displayed) Labs Reviewed  COMPREHENSIVE METABOLIC PANEL - Abnormal; Notable for the following:       Result Value   Sodium 134 (*)    Chloride 99 (*)    Glucose, Bld 115 (*)    BUN 30 (*)    Albumin 2.5 (*)    AST 42 (*)    Alkaline Phosphatase 227 (*)  All other components within normal limits  CBC WITH DIFFERENTIAL/PLATELET - Abnormal; Notable for the following:    RBC 3.35 (*)    Hemoglobin 9.9 (*)    HCT 31.2 (*)    All other components within normal limits  URINALYSIS, ROUTINE W REFLEX MICROSCOPIC (NOT AT [facility])  I-STAT CG4 LACTIC ACID, ED  I-STAT CG4 LACTIC ACID, ED    EKG  EKG Interpretation None        Radiology No results found.  Procedures Procedures (including critical care time)  Medications Ordered in ED Medications  morphine 4 MG/ML injection 4 mg (4 mg Intravenous Given 05/25/16 2116)     Initial Impression / Assessment and Plan / ED Course  I have reviewed the triage vital signs and the nursing notes.  Pertinent labs & imaging results that were available during my care of the patient were reviewed by me and considered in my medical decision making (see chart for details).  Clinical Course    82 year old male with history of CAD, recent external/internal biliary drain placement due to duodenal obstruction presents to the ED complaining of fever for the last week and pain around drain insertion site worsening today. Patient also notes a foul odor to biliary drainage today. On presentation to ED patient is complaining of profound weakness and fatigue. Oral temp is unremarkable. However, rectal temp is 100.8. All other vital signs are stable. No leukocytosis or lactic acidosis. Doubt sepsis. UA unremarkable. We will obtain CT abdomen pelvis to rule out abscess. No antibiotics given at this time. Patient's pain also difficult to manage. He was given 2 doses of morphine and 1 dose of Dilaudid with animal improvement in his pain.  Patient signed out to [facility] PA-C at shift change pending CT abdomen. CT reveals abscess, surgery will be consulted. If CT unremarkable, consult gastroenterology as well as family medicine for admission for continued fluids and pain control. Pts PCP is Dr. Ree Kida.   Final Clinical Impressions(s) / ED Diagnoses   Final diagnoses:  Fever, unspecified fever cause    New Prescriptions New Prescriptions   No medications on file     Carlos Levering, PA-C 05/26/16 0115    Julianne Rice, MD 06/07/16 1500

## 2016-05-25 NOTE — Telephone Encounter (Signed)
Patient called and spoke with home health nurse and his temp has gone back up to 101 and also noticed an odor this morning.  Will forward to PCP regarding this concern.  Patient doesn't want to come in and be evaluated. Aubrei Bouchie,CMA

## 2016-05-25 NOTE — Telephone Encounter (Signed)
Patient calling to report fever Tmax 101.8 today.  He reports biliary drainage has a foul odor.  He is scheduled for a stent internalization next week with Dr. Annamaria Boots.  I called and spoke to IR at [facility] and they recommended ED visit now.  Reviewed with Dr. Ardis Hughs as well and he agrees with ED.  Patient notified and he verbalized understanding and he will go to [facility] ED.

## 2016-05-25 NOTE — ED Triage Notes (Signed)
Pt scheduled to have surgery for stent placement in gall bladder. Pt has drain in place. Pt reports drain has had dark drainage and foul smell. Pt here to have wound checked.

## 2016-05-26 ENCOUNTER — Encounter (HOSPITAL_COMMUNITY): Payer: Self-pay | Admitting: General Practice

## 2016-05-26 ENCOUNTER — Observation Stay (HOSPITAL_COMMUNITY): Payer: Medicare Other

## 2016-05-26 DIAGNOSIS — T85528S Displacement of other gastrointestinal prosthetic devices, implants and grafts, sequela: Secondary | ICD-10-CM | POA: Diagnosis not present

## 2016-05-26 DIAGNOSIS — T85520A Displacement of bile duct prosthesis, initial encounter: Secondary | ICD-10-CM

## 2016-05-26 DIAGNOSIS — K831 Obstruction of bile duct: Secondary | ICD-10-CM | POA: Diagnosis not present

## 2016-05-26 DIAGNOSIS — R509 Fever, unspecified: Secondary | ICD-10-CM | POA: Diagnosis not present

## 2016-05-26 LAB — BASIC METABOLIC PANEL
Anion gap: 11 (ref 5–15)
BUN: 24 mg/dL — AB (ref 6–20)
CO2: 24 mmol/L (ref 22–32)
Calcium: 9.9 mg/dL (ref 8.9–10.3)
Chloride: 100 mmol/L — ABNORMAL LOW (ref 101–111)
Creatinine, Ser: 0.73 mg/dL (ref 0.61–1.24)
GFR calc Af Amer: >60 mL/min (ref >60)
GLUCOSE: 99 mg/dL (ref 65–99)
POTASSIUM: 4.2 mmol/L (ref 3.5–5.1)
Sodium: 135 mmol/L (ref 135–145)

## 2016-05-26 LAB — CBC
HEMATOCRIT: 31.6 % — AB (ref 39.0–52.0)
Hemoglobin: 9.7 g/dL — ABNORMAL LOW (ref 13.0–17.0)
MCH: 28.6 pg (ref 26.0–34.0)
MCHC: 30.7 g/dL (ref 30.0–36.0)
MCV: 93.2 fL (ref 78.0–100.0)
Platelets: 335 10*3/uL (ref 150–400)
RBC: 3.39 MIL/uL — ABNORMAL LOW (ref 4.22–5.81)
RDW: 14.8 % (ref 11.5–15.5)
WBC: 8.7 10*3/uL (ref 4.0–10.5)

## 2016-05-26 LAB — GLUCOSE, CAPILLARY: Glucose-Capillary: 123 mg/dL — ABNORMAL HIGH (ref 65–99)

## 2016-05-26 MED ORDER — ALBUTEROL SULFATE (2.5 MG/3ML) 0.083% IN NEBU
3.0000 mL | INHALATION_SOLUTION | Freq: Four times a day (QID) | RESPIRATORY_TRACT | Status: DC | PRN
  Administered 2016-05-26 – 2016-05-27 (×2): 3 mL via RESPIRATORY_TRACT
  Filled 2016-05-26 (×2): qty 3

## 2016-05-26 MED ORDER — MORPHINE SULFATE (PF) 4 MG/ML IV SOLN
4.0000 mg | Freq: Once | INTRAVENOUS | Status: AC
  Administered 2016-05-26: 4 mg via INTRAVENOUS
  Filled 2016-05-26: qty 1

## 2016-05-26 MED ORDER — CARBAMAZEPINE 100 MG PO CHEW
300.0000 mg | CHEWABLE_TABLET | Freq: Two times a day (BID) | ORAL | Status: DC
  Administered 2016-05-26 – 2016-05-28 (×5): 300 mg via ORAL
  Filled 2016-05-26 (×5): qty 3

## 2016-05-26 MED ORDER — ENOXAPARIN SODIUM 40 MG/0.4ML ~~LOC~~ SOLN
40.0000 mg | SUBCUTANEOUS | Status: DC
  Administered 2016-05-26 – 2016-05-27 (×2): 40 mg via SUBCUTANEOUS
  Filled 2016-05-26 (×2): qty 0.4

## 2016-05-26 MED ORDER — SERTRALINE HCL 50 MG PO TABS
150.0000 mg | ORAL_TABLET | Freq: Every morning | ORAL | Status: DC
  Administered 2016-05-26 – 2016-05-28 (×3): 150 mg via ORAL
  Filled 2016-05-26 (×4): qty 1

## 2016-05-26 MED ORDER — METOPROLOL SUCCINATE ER 25 MG PO TB24
12.5000 mg | ORAL_TABLET | Freq: Every day | ORAL | Status: DC
  Administered 2016-05-26 – 2016-05-27 (×2): 12.5 mg via ORAL
  Administered 2016-05-28: 10:00:00 via ORAL
  Filled 2016-05-26 (×3): qty 1

## 2016-05-26 MED ORDER — TRAZODONE HCL 50 MG PO TABS
50.0000 mg | ORAL_TABLET | Freq: Every day | ORAL | Status: DC
Start: 2016-05-26 — End: 2016-05-28
  Administered 2016-05-26 – 2016-05-27 (×2): 50 mg via ORAL
  Filled 2016-05-26 (×2): qty 1

## 2016-05-26 MED ORDER — NALOXONE HCL 0.4 MG/ML IJ SOLN: 0.4000 mg | INTRAMUSCULAR | Status: DC | PRN

## 2016-05-26 MED ORDER — OXYCODONE HCL 5 MG PO TABS: 5.0000 mg | ORAL_TABLET | Freq: Four times a day (QID) | ORAL | 0 refills | Status: AC | PRN

## 2016-05-26 MED ORDER — ACETAMINOPHEN 325 MG PO TABS: 650.0000 mg | ORAL_TABLET | Freq: Four times a day (QID) | ORAL | Status: DC | PRN

## 2016-05-26 MED ORDER — ENSURE ENLIVE PO LIQD
237.0000 mL | Freq: Two times a day (BID) | ORAL | Status: DC
  Administered 2016-05-26 – 2016-05-28 (×5): 237 mL via ORAL
  Filled 2016-05-26 (×8): qty 237

## 2016-05-26 MED ORDER — CIPROFLOXACIN HCL 750 MG PO TABS: 750.0000 mg | ORAL_TABLET | Freq: Two times a day (BID) | ORAL | 0 refills | Status: DC

## 2016-05-26 MED ORDER — IBUPROFEN 200 MG PO TABS
400.0000 mg | ORAL_TABLET | ORAL | Status: DC | PRN
  Administered 2016-05-26 – 2016-05-27 (×2): 400 mg via ORAL
  Filled 2016-05-26 (×2): qty 2

## 2016-05-26 MED ORDER — FAMOTIDINE 20 MG PO TABS
20.0000 mg | ORAL_TABLET | Freq: Every day | ORAL | Status: DC
  Administered 2016-05-26 – 2016-05-28 (×3): 20 mg via ORAL
  Filled 2016-05-26 (×3): qty 1

## 2016-05-26 MED ORDER — HYDROMORPHONE HCL 1 MG/ML IJ SOLN
1.0000 mg | INTRAMUSCULAR | Status: DC | PRN
  Administered 2016-05-26 (×2): 1 mg via INTRAVENOUS
  Filled 2016-05-26 (×2): qty 1

## 2016-05-26 MED ORDER — MOMETASONE FURO-FORMOTEROL FUM 100-5 MCG/ACT IN AERO
2.0000 | INHALATION_SPRAY | Freq: Two times a day (BID) | RESPIRATORY_TRACT | Status: DC
  Administered 2016-05-26 – 2016-05-28 (×4): 2 via RESPIRATORY_TRACT
  Filled 2016-05-26: qty 8.8

## 2016-05-26 MED ORDER — DOCUSATE SODIUM 100 MG PO CAPS
100.0000 mg | ORAL_CAPSULE | Freq: Every evening | ORAL | Status: DC
  Administered 2016-05-26 – 2016-05-27 (×2): 100 mg via ORAL
  Filled 2016-05-26 (×2): qty 1

## 2016-05-26 MED ORDER — METRONIDAZOLE 500 MG PO TABS: 500.0000 mg | ORAL_TABLET | Freq: Four times a day (QID) | ORAL | 0 refills | Status: DC

## 2016-05-26 MED ORDER — TAMSULOSIN HCL 0.4 MG PO CAPS
0.4000 mg | ORAL_CAPSULE | Freq: Every day | ORAL | Status: DC
  Administered 2016-05-26 – 2016-05-28 (×3): 0.4 mg via ORAL
  Filled 2016-05-26 (×3): qty 1

## 2016-05-26 MED ORDER — FINASTERIDE 5 MG PO TABS
5.0000 mg | ORAL_TABLET | Freq: Every morning | ORAL | Status: DC
  Administered 2016-05-26 – 2016-05-28 (×3): 5 mg via ORAL
  Filled 2016-05-26 (×3): qty 1

## 2016-05-26 MED ORDER — IPRATROPIUM BROMIDE 0.02 % IN SOLN
0.5000 mg | Freq: Two times a day (BID) | RESPIRATORY_TRACT | Status: DC | PRN
  Administered 2016-05-27 – 2016-05-28 (×2): 0.5 mg via RESPIRATORY_TRACT
  Filled 2016-05-26 (×2): qty 2.5

## 2016-05-26 MED ORDER — METRONIDAZOLE 500 MG PO TABS
500.0000 mg | ORAL_TABLET | Freq: Four times a day (QID) | ORAL | Status: DC
  Administered 2016-05-26 – 2016-05-27 (×4): 500 mg via ORAL
  Filled 2016-05-26 (×4): qty 1

## 2016-05-26 MED ORDER — ISOSORBIDE MONONITRATE ER 30 MG PO TB24
30.0000 mg | ORAL_TABLET | Freq: Every day | ORAL | Status: DC
  Administered 2016-05-26 – 2016-05-28 (×3): 30 mg via ORAL
  Filled 2016-05-26 (×3): qty 1

## 2016-05-26 MED ORDER — OXYCODONE HCL 5 MG PO TABS: 5.0000 mg | ORAL_TABLET | Freq: Four times a day (QID) | ORAL | Status: DC | PRN

## 2016-05-26 MED ORDER — SODIUM CHLORIDE 0.9 % IV BOLUS (SEPSIS)
500.0000 mL | Freq: Once | INTRAVENOUS | Status: AC
  Administered 2016-05-26: 500 mL via INTRAVENOUS

## 2016-05-26 MED ORDER — SODIUM CHLORIDE 0.9 % IV SOLN
INTRAVENOUS | Status: DC
  Administered 2016-05-26 – 2016-05-28 (×4): via INTRAVENOUS

## 2016-05-26 MED ORDER — HYDROMORPHONE HCL 1 MG/ML IJ SOLN
1.0000 mg | Freq: Once | INTRAMUSCULAR | Status: AC
  Administered 2016-05-26: 1 mg via INTRAVENOUS
  Filled 2016-05-26: qty 1

## 2016-05-26 MED ORDER — SENNA 8.6 MG PO TABS
1.0000 | ORAL_TABLET | Freq: Every day | ORAL | Status: DC
Start: 2016-05-26 — End: 2016-05-28
  Administered 2016-05-26 – 2016-05-27 (×2): 8.6 mg via ORAL
  Filled 2016-05-26 (×2): qty 1

## 2016-05-26 MED ORDER — CIPROFLOXACIN HCL 500 MG PO TABS
750.0000 mg | ORAL_TABLET | Freq: Two times a day (BID) | ORAL | Status: DC
  Administered 2016-05-26 (×2): 750 mg via ORAL
  Filled 2016-05-26 (×2): qty 1

## 2016-05-26 NOTE — Care Management Note (Signed)
Case Management Note  Patient Details  Name: ZEEV WIANT MRN: [id number] Date of Birth: Sep 28, 1924  Subjective/Objective:                    Action/Plan: Pt discharging home with his wife. No further needs per CM.  Expected Discharge Date:                  Expected Discharge Plan:  Home/Self Care  In-House Referral:     Discharge planning Services     Post Acute Care Choice:    Choice offered to:     DME Arranged:    DME Agency:     HH Arranged:    [place] Agency:     Status of Service:  Completed, signed off  If discussed at [employer] of Stay Meetings, dates discussed:    Additional Comments:  Pollie Friar, RN 05/26/2016, 2:23 PM

## 2016-05-26 NOTE — Progress Notes (Signed)
  FPTS Interim Progress Note  S: Mr. Haener reports feeling feverish. Has not had appetite for about 3 weeks but is trying to drink a lot. Reports abdominal pain on his right side near drain that is worse when he coughs. Denies nausea, vomiting, diarrhea.   O: BP (!) 134/58 (BP Location: Left Arm)   Pulse 76   Temp 99.1 F (37.3 C) (Oral)   Resp 17   Ht 5\' 10"  (1.778 m)   Wt 152 lb 9.6 oz (69.2 kg)   SpO2 97%   BMI 21.90 kg/m   Gen: thin, elderly, chronically ill appearing man laying in bed in NAD Heart: RRR no murmurs rubs or gallops Lungs: CTAB no increased work of breathing Abdomen: soft, non-distended, non-tender, drain site on right side with mild erythema around tube, non-tender to palpation around site Extremities: no edema or cyanosis  A/P: Fever: Tmax 100.8. Afebrile since admission. Lactic acid normal. WBC normal. Vital signs have been within normal limits. Drain site without significant erythema, no drainage around incision, bag filled with green-brown bile. CT abdomen was negative.  -monitor fever curve -CXR pending -blood cultures pending -per IR, no need to culture bile, keep date for stent Oct. 3rd, can consider swapping tube if fevers continue but will not do that at this time. Can add oral antibiotics and observe to ensure no further fevers  CBD Mass: -regular diet -Dilaudid 1mg  q4h as needed for pain -tylenol and ibuprofen as needed for pain  CAD -holding ASA -consider readding Lipitor  Systolic CHF -held home lasix, patient appears dry on exam -daily weights, strict I/O's  HTN -continue imdur and metoprolol  Anemia -Hgb this morning 9.7 -consider further work up  -monitor daily -threshold for transfusion 8   Depression: - continue Zoloft   Asthma/COPD: Reports intermittent use of home O2 at 2L by [place].  -continue Dulera (formulary substitute for Symbicort)  -albuterol and tiotroprium PRN  Peripheral Neuropathy: - continue Tegretol    BPH with history of urinary retention:  - cont finasteride and Flomax    Steve Rattler, DO 05/26/2016, 9:01 AM PGY-1, [facility] Medicine Service pager [phone]

## 2016-05-26 NOTE — Progress Notes (Signed)
Received pt from the ED via stretcher, alert and oriented x 3. No c/o pain, SOB or any discomfort. Biliary drain in place. Call bell within reach. Pt oriented to the unit . Will cont to monitor.

## 2016-05-26 NOTE — Care Management Obs Status (Signed)
[place] NOTIFICATION   Patient Details  Name: [name] MRN: [id number] Date of Birth: 11-30-1924   Medicare Observation Status Notification Given:  Yes    Pollie Friar, RN 05/26/2016, 11:25 AM

## 2016-05-26 NOTE — Progress Notes (Signed)
Patient seen with his son and wife at his bed side. Other than pain on his right side at the drain location, he denies any concern. He denies cough or SOB. Uses home O2 at baseline and he is saturating well. He has been afebrile since admission to the floor.  His CXRay report is below suggestive of atelectasis vs PNA. The image was compared to previous xray and I did not notice any difference.  Patient has no clinical symptoms of PNA.   May place on oral A/B pending culture report. He is otherwise stable and he is amendable to going home today.  Close follow up at the clinic this week and strict return precaution discussed with him and his family. All questions were answered.    X-ray Chest Pa And Lateral  Result Date: 05/26/2016 CLINICAL DATA:  Fever, weakness. History of CHF, cardiac dysrhythmia, as CABG. EXAM: CHEST  2 VIEW COMPARISON:  Chest x-ray of April 27, 2016 FINDINGS: The lungs are less well inflated today. There is a small right pleural effusion. The interstitial markings are more conspicuous at the right lung base. The cardiac silhouette is mildly enlarged. The pulmonary vascularity is not engorged. There are post CABG changes. There is calcification in the wall of the aortic arch. The permanent pacemaker is in stable position. The bony thorax exhibits no acute abnormality. An external biliary drainage tube is visible on the right. IMPRESSION: Right basilar atelectasis or pneumonia posteriorly. Small right pleural effusion. Stable cardiomegaly without pulmonary vascular congestion. Aortic atherosclerosis. Electronically Signed   By: David  Martinique M.D.   On: 05/26/2016 09:59   Ct Abdomen Pelvis W Contrast  Result Date: 05/26/2016 CLINICAL DATA:  Acute onset of fever and worsening right upper quadrant abdominal pain. Status post internal/external biliary drain placement. Initial encounter. Initial encounter. EXAM: CT ABDOMEN AND PELVIS WITH CONTRAST TECHNIQUE: Multidetector CT imaging  of the abdomen and pelvis was performed using the standard protocol following bolus administration of intravenous contrast. CONTRAST:  179mL ISOVUE-300 IOPAMIDOL (ISOVUE-300) INJECTION 61% COMPARISON:  CT of the abdomen and pelvis performed 04/28/2016 FINDINGS: Lower chest: Mild right basilar atelectasis is noted, with trace associated fluid. Mild scarring is noted at the left lung base. Diffuse coronary artery calcifications are seen. Hepatobiliary: There has been interval placement of a right-sided internal/external biliary drain, noted ending at the second segment of the duodenum and passing through the liver and common bile duct. The common bile duct is decompressed, and associated pneumobilia is noted. The gallbladder is contracted and grossly unremarkable. No focal fluid collections are seen. Pancreas: There is persistent dilatation of the pancreatic duct to 1.5 cm, relatively stable from the prior study. 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 Spleen: The spleen is unremarkable in appearance. Adrenals/Urinary Tract: The adrenal glands are unremarkable. Scattered bilateral renal cysts are seen. A nonobstructing 5 mm stone is noted at the lower pole of the right kidney. There is no evidence of hydronephrosis. No obstructing ureteral stones are identified. Mild perinephric stranding is noted bilaterally. Stomach/Bowel: The stomach is unremarkable in appearance. The small bowel is within normal limits. The patient is status post appendectomy. Scattered diverticulosis is noted at the sigmoid colon, without evidence of diverticulitis. Vascular/Lymphatic: Scattered calcification is seen along the abdominal aorta and its branches. The abdominal aorta is otherwise grossly unremarkable. The inferior vena cava is grossly unremarkable. No retroperitoneal lymphadenopathy is seen. No pelvic sidewall lymphadenopathy is identified. Reproductive: The bladder is mildly distended and  grossly unremarkable. The  prostate remains normal in size. Other: No additional soft tissue abnormalities are seen. Musculoskeletal: No acute osseous abnormalities are identified. The patient's right femoral hardware is incompletely imaged but appears grossly unremarkable. The visualized musculature is unremarkable in appearance. There is grade 2 anterolisthesis of L5 on S1, reflecting facet disease. IMPRESSION: 1. Interval placement of right-sided internal/external biliary drain, noted ending at the second segment of the duodenum and passing through the liver and common bile duct. The common bile duct is decompressed, and associated pneumobilia is seen. The gallbladder is contracted and grossly unremarkable. No focal fluid collections seen. 2. Persistent dilatation of the pancreatic duct to 1.5 cm, relatively stable from the prior study. 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 3. Mild right basilar atelectasis, with trace associated fluid. Mild scarring at the left lung base. 4. Diffuse coronary artery calcifications seen. 5. Scattered aortic atherosclerosis. 6. Nonobstructing 5 mm stone at the lower pole of the right kidney. Scattered bilateral renal cysts seen. 7. Grade 2 anterolisthesis of L5 on S1, reflecting underlying facet disease. 8. Scattered diverticulosis of the sigmoid colon, without evidence of diverticulitis. Electronically Signed   By: Garald Balding M.D.   On: 05/26/2016 02:22

## 2016-05-26 NOTE — Discharge Summary (Signed)
[facility] Discharge Summary  Patient name: [name] Medical record number: [id number] Date of birth: 1925-03-31 Age: 83 y.o. Gender: male Date of Admission: 05/25/2016  Date of Discharge: 05/26/16  Admitting Physician: Shane Resides, MD  Primary Care Provider: Bufford Lope, DO Consultants: IR  Indication for Hospitalization: fevers  Discharge Diagnoses/Problem List:   Biliary drain   Patient Active Problem List   Diagnosis Date Noted  . Fever 05/26/2016  . Pyrexia   . Biliary drain displacement   . 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 Cholangitis   . Peri-ampullary neoplasm   . Malnutrition of moderate degree 04/29/2016  . Common bile duct obstruction   . Elevated liver function tests   . Bacteremia due to Gram-negative bacteria ([place]) 04/27/2016  . BPH (benign prostatic hyperplasia)   . Asthma, mild intermittent   . Insomnia 02/18/2016  . DOE (dyspnea on exertion)   . Pacemaker 12/25/2015  . Atrial flutter, RVR 04/10/2015  . Unstable angina ([place]) 04/10/2015  . Coronary artery disease involving native coronary artery of native heart without angina pectoris 04/10/2015  . Urinary retention due to benign prostatic hyperplasia   . Systolic CHF ([place]) [id number]  . Depression 09/26/2013  . Intertrochanteric fracture of right hip ([place]) 08/29/2013  . Hypertension   . GERD (gastroesophageal reflux disease)   . COPD (chronic obstructive pulmonary disease) ([place])   . H/O cardiac arrest   . Junctional bradycardia 07/25/2012  . Glucose intolerance (impaired glucose tolerance) 07/19/2012  . Normocytic anemia 07/19/2012  . Acute MI, anterolateral wall ([place]) 07/16/2012  . Prostatitis 07/16/2012  . E. coli UTI (urinary tract infection) 07/16/2012  . Elevated transaminase level 07/16/2012  . Foot drop, right 07/28/2011  . LBBB (left bundle branch block) 03/29/2011  . Hx of CABG and re-do CABG  03/29/2011  . Ischemic heart disease   . Myocardial  infarction ([place])   . Acute systolic congestive heart failure, NYHA class 2-EF 40-45%   . Chronic respiratory failure ([place])   . Chronic asthma   . Peripheral neuropathy ([place])   . Dyslipidemia (high LDL; low HDL)   . Essential hypertension 04/04/2007  . PSORIASIS 04/04/2007  . OSTEOARTHRITIS 04/04/2007  . OSTEOPOROSIS 04/04/2007  . Coronary artery disease due to lipid rich plaque 01/05/2007  . HERNIA 01/05/2007     Disposition: home  Discharge Condition: stable, improved  Discharge Exam:  Gen: thin, elderly, chronically ill appearing man laying in bed in NAD Heart: RRR no murmurs rubs or gallops Lungs: CTAB no increased work of breathing Abdomen: soft, non-distended, non-tender, drain site on right side with mild erythema around tube, non-tender to palpation around site Extremities: no edema or cyanosis  Brief Hospital Course:  [name] is an 82 year old male who was admitted due to pain and fevers with concern for infection due to percutaneous transhepatic biliary drain that was placed during his last hospitalization. PMH is significant for HTN, CAD s/p CABG 1986 with redo 1993, PTCA 2008, and DES 2010, hx MI, LBBB, HFrEF, Asthma/COPD, GERD, OA, Osteoporosis, Dyslipidemia, Peripheral Neuropathy, BPH with history or urinary retention, Pacemaker for tachy brady syndrome (03/2015), History of atrial flutter (no anticoagulation due to falls and anemia)   Patient had reported decrease appetite and intermittent fevers at home up to 101.59F and was instructed to come to the ED for concern of infection due to biliary drain. Patient was febrile once to 100.11F in the ED but with otherwise normal temperatures. Vitals were within normal  limits. WBC was normal. Lactic acid level was normal. CT abdomen negative for abscess. Urinalysis was negative. CXR showed right basilar atelectasis vs possible pneumonia, however thought more likely to be atelectasis due to patients difficulty taking deep breathing  due to pain from drain. He denied cough. Drain site without erythema or signs of infection. Interventional radiology was contacted and recommended proceeding with stent placement as scheduled for October 3rd. Pain is to be expected at the drain site. IR did not want to swap the tube out at this time as it was in a good position and he had remained afebrile. Recommended a course of antibiotics and to monitor for further fevers but otherwise cleared him for discharge. Patient received IV 1mg  dilaudid for pain. He was started on oral Ciprofloxacin and Flagyl and remained afebrile, was therefore discharged home with close follow up on 9/29 at the [facility].   Issues for Follow Up:  1. Follow up blood cultures from hospital 2. Ensure patient has completed course of ciprofloxacin and metronidazole  3. Follow up on pain control at drain site 4. Consider restarting Lipitor  Significant Procedures: none  Significant Labs and Imaging:   Recent Labs Lab 05/25/16 1711 05/26/16 0455  WBC 10.1 8.7  HGB 9.9* 9.7*  HCT 31.2* 31.6*  PLT 331 335    Recent Labs Lab 05/25/16 1711 05/26/16 0455  NA 134* 135  K 4.2 4.2  CL 99* 100*  CO2 26 24  GLUCOSE 115* 99  BUN 30* 24*  CREATININE 0.89 0.73  CALCIUM 9.7 9.9  ALKPHOS 227*  --   AST 42*  --   ALT 38  --   ALBUMIN 2.5*  --    X-ray Chest Pa And Lateral  Result Date: 05/26/2016 CLINICAL DATA:  Fever, weakness. History of CHF, cardiac dysrhythmia, as CABG. EXAM: CHEST  2 VIEW COMPARISON:  Chest x-ray of April 27, 2016 FINDINGS: The lungs are less well inflated today. There is a small right pleural effusion. The interstitial markings are more conspicuous at the right lung base. The cardiac silhouette is mildly enlarged. The pulmonary vascularity is not engorged. There are post CABG changes. There is calcification in the wall of the aortic arch. The permanent pacemaker is in stable position. The bony thorax exhibits no acute abnormality.  An external biliary drainage tube is visible on the right. IMPRESSION: Right basilar atelectasis or pneumonia posteriorly. Small right pleural effusion. Stable cardiomegaly without pulmonary vascular congestion. Aortic atherosclerosis. Electronically Signed   By: David  Martinique M.D.   On: 05/26/2016 09:59   Ct Abdomen Pelvis W Contrast  Result Date: 05/26/2016 CLINICAL DATA:  Acute onset of fever and worsening right upper quadrant abdominal pain. Status post internal/external biliary drain placement. Initial encounter. Initial encounter. EXAM: CT ABDOMEN AND PELVIS WITH CONTRAST TECHNIQUE: Multidetector CT imaging of the abdomen and pelvis was performed using the standard protocol following bolus administration of intravenous contrast. CONTRAST:  14mL ISOVUE-300 IOPAMIDOL (ISOVUE-300) INJECTION 61% COMPARISON:  CT of the abdomen and pelvis performed 04/28/2016 FINDINGS: Lower chest: Mild right basilar atelectasis is noted, with trace associated fluid. Mild scarring is noted at the left lung base. Diffuse coronary artery calcifications are seen. Hepatobiliary: There has been interval placement of a right-sided internal/external biliary drain, noted ending at the second segment of the duodenum and passing through the liver and common bile duct. The common bile duct is decompressed, and associated pneumobilia is noted. The gallbladder is contracted and grossly unremarkable. No focal fluid collections  are seen. Pancreas: There is persistent dilatation of the pancreatic duct to 1.5 cm, relatively stable from the prior study. 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 Spleen: The spleen is unremarkable in appearance. Adrenals/Urinary Tract: The adrenal glands are unremarkable. Scattered bilateral renal cysts are seen. A nonobstructing 5 mm stone is noted at the lower pole of the right kidney. There is no evidence of hydronephrosis. No obstructing ureteral stones are identified.  Mild perinephric stranding is noted bilaterally. Stomach/Bowel: The stomach is unremarkable in appearance. The small bowel is within normal limits. The patient is status post appendectomy. Scattered diverticulosis is noted at the sigmoid colon, without evidence of diverticulitis. Vascular/Lymphatic: Scattered calcification is seen along the abdominal aorta and its branches. The abdominal aorta is otherwise grossly unremarkable. The inferior vena cava is grossly unremarkable. No retroperitoneal lymphadenopathy is seen. No pelvic sidewall lymphadenopathy is identified. Reproductive: The bladder is mildly distended and grossly unremarkable. The prostate remains normal in size. Other: No additional soft tissue abnormalities are seen. Musculoskeletal: No acute osseous abnormalities are identified. The patient's right femoral hardware is incompletely imaged but appears grossly unremarkable. The visualized musculature is unremarkable in appearance. There is grade 2 anterolisthesis of L5 on S1, reflecting facet disease. IMPRESSION: 1. Interval placement of right-sided internal/external biliary drain, noted ending at the second segment of the duodenum and passing through the liver and common bile duct. The common bile duct is decompressed, and associated pneumobilia is seen. The gallbladder is contracted and grossly unremarkable. No focal fluid collections seen. 2. Persistent dilatation of the pancreatic duct to 1.5 cm, relatively stable from the prior study. 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 3. Mild right basilar atelectasis, with trace associated fluid. Mild scarring at the left lung base. 4. Diffuse coronary artery calcifications seen. 5. Scattered aortic atherosclerosis. 6. Nonobstructing 5 mm stone at the lower pole of the right kidney. Scattered bilateral renal cysts seen. 7. Grade 2 anterolisthesis of L5 on S1, reflecting underlying facet disease. 8. Scattered  diverticulosis of the sigmoid colon, without evidence of diverticulitis. Electronically Signed   By: Garald Balding M.D.   On: 05/26/2016 02:22   Results/Tests Pending at Time of Discharge: final blood culture results  Discharge Medications:    Medication List    TAKE these medications   acetaminophen 500 MG tablet Commonly known as:  TYLENOL Take 1,000 mg by mouth every 6 (six) hours as needed for fever.   albuterol 108 (90 Base) MCG/ACT inhaler Commonly known as:  PROVENTIL HFA;VENTOLIN HFA Inhale 2 puffs into the lungs every 6 (six) hours as needed for wheezing or shortness of breath.   aspirin 81 MG EC tablet Take 1 tablet (81 mg total) by mouth daily.   budesonide-formoterol 80-4.5 MCG/ACT inhaler Commonly known as:  SYMBICORT Inhale 2 puffs into the lungs 2 (two) times daily.   CALTRATE GUMMY BITES 250-400 MG-UNIT Chew Generic drug:  Ca Phosphate-Cholecalciferol Chew 2 tablets by mouth 2 (two) times daily.   carbamazepine 100 MG chewable tablet Commonly known as:  TEGRETOL Chew 300 mg by mouth 2 (two) times daily.   ciprofloxacin 750 MG tablet Commonly known as:  CIPRO Take 1 tablet (750 mg total) by mouth 2 (two) times daily.   docusate sodium 100 MG capsule Commonly known as:  COLACE Take 100 mg by mouth every evening.   feeding supplement (ENSURE ENLIVE) Liqd Take 237 mLs by mouth 2 (two) times daily between meals.   feeding supplement (PRO-STAT SUGAR  FREE 64) Liqd Take 30 mLs by mouth 2 (two) times daily.   finasteride 5 MG tablet Commonly known as:  PROSCAR Take 5 mg by mouth every morning.   ipratropium 0.02 % nebulizer solution Commonly known as:  ATROVENT Take 2.5 mLs (0.5 mg total) by nebulization 2 (two) times daily as needed for wheezing or shortness of breath.   ipratropium 17 MCG/ACT inhaler Commonly known as:  ATROVENT HFA Inhale 2 puffs into the lungs 2 (two) times daily as needed for wheezing.   isosorbide mononitrate 30 MG 24 hr  tablet Commonly known as:  IMDUR Take 1 tablet (30 mg total) by mouth daily.   Melatonin 3 MG Tabs Take 3 mg by mouth at bedtime.   metoprolol succinate 12.5 mg Tb24 24 hr tablet Commonly known as:  TOPROL-XL Take 12.5 mg by mouth daily.   metroNIDAZOLE 500 MG tablet Commonly known as:  FLAGYL Take 1 tablet (500 mg total) by mouth every 6 (six) hours. Next dose at 6pm   multivitamin with minerals tablet Take 1 tablet by mouth every evening.   nitroGLYCERIN 0.4 MG SL tablet Commonly known as:  NITROSTAT Place 1 tablet (0.4 mg total) under the tongue every 5 (five) minutes as needed for chest pain (As needed for chest pain).   oxyCODONE 5 MG immediate release tablet Commonly known as:  ROXICODONE Take 1 tablet (5 mg total) by mouth every 6 (six) hours as needed for severe pain or breakthrough pain.   potassium chloride SA 20 MEQ tablet Commonly known as:  K-DUR,KLOR-CON Take 20 mEq by mouth daily.   ranitidine 150 MG tablet Commonly known as:  ZANTAC TAKE 1 TABLET BY MOUTH 2 TIMES DAILY. What changed:  See the new instructions.   senna 8.6 MG tablet Commonly known as:  SENOKOT Take 1 tablet by mouth daily.   sertraline 100 MG tablet Commonly known as:  ZOLOFT Take 150 mg by mouth every morning.   tamsulosin 0.4 MG Caps capsule Commonly known as:  FLOMAX Take 1 capsule (0.4 mg total) by mouth daily.   traZODone 50 MG tablet Commonly known as:  DESYREL Take 1 tablet (50 mg total) by mouth at bedtime.       Discharge Instructions: Please refer to Patient Instructions section of EMR for full details.  Patient was counseled important signs and symptoms that should prompt return to medical care, changes in medications, dietary instructions, activity restrictions, and follow up appointments.   Follow-Up Appointments: Follow-up Information    Carlyle Dolly, MD. Go on 05/28/2016.   Specialty:  Family Medicine Why:  At 1:45 pm Contact information: [place] [address] [phone]           Steve Rattler, DO 05/26/2016, 2:22 PM PGY-1, [place]

## 2016-05-26 NOTE — ED Notes (Addendum)
Pt has R sided gallbladder drain x about 3 weeks. Per pt, drainage is darker with foul odor, also had fever. CT Abd/Pelvis negative for abcess. Pt is scheduled soon for stent placement, drain is temporary. Alert a&ox4. Received 532ml's NS and Dilaudid and Morphine for pain earlier.

## 2016-05-26 NOTE — Progress Notes (Signed)
rn spoke with md, pt will stay the night.   rn assisted pt back into bed from recliner.

## 2016-05-26 NOTE — H&P (Signed)
[facility] Admission History and Physical Service Pager: [phone]  Patient name: [name] Medical record number: [id number] Date of birth: 03-15-1925 Age: 82 y.o. Gender: male  Primary Care Provider: Bufford Lope, DO Consultants: IR  Code Status: FULL   Chief Complaint: fever   Assessment and Plan: [name] is a 82 y.o. male presenting with fever. PMH is significant for HTN, CAD s/p CABG 1986 with redo 1993, PTCA 2008, and DES 2010, hx MI, LBBB, HFrEF, Asthma/COPD, GERD, OA, Osteoporosis, Dyslipidemia, Peripheral Neuropathy, BPH with history or urinary retention, Pacemaker for tachy brady syndrome (03/2015), History of atrial flutter (no anticoagulation due to falls and anemia)   Fever: Several days of intermittent fevers at home. Tmax in ED 100.8 with otherwise normal temperatures and afebrile at admission. Patient without leukocytosis or lactic acidosis. Other vital signs are stable. 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 Percutaneous transhepatic internal/extrenal biliary drain is in place. Patient had instrumentation of biliary drain on 9/21 with subsequent fevers so possible that fevers related to these instrumentation. CT abdomen was performed and was negative for abscess. Drain without surrounding soft tissue erythema or infection. At last hospitalization, patient found to have Klebsiella bacteremia. He was treated with CTX during hospitalization and discharged with course of Bactrim which he completed. Repeat blood cultures demonstrated clearance of bacteremia. UA negative for infection in ED.  -admit to MedSurg, vital signs per unit  -monitor fever curve -CXR  -blood cultures  -if patient spikes another temperature, low threshold to initiate broad spectrum antibiotics   CBD Mass: CA 19-9 elevated to 91 at previous admission. Biopsy showed duodenal tubulovillous adenoma. Biliary drain in place. IR planning  for stent internalization on 10/3.  -IR consult in AM  -NPO pending IR recs -pain control: Dilaudid 1 mg q4h prn, tylenol prn, ibuprofen prn    CAD:  CABG 1986 with redo 1993, PTCA 2008, and DES 2010. Review of phone records with PCP show that patient was instructed to resume Lipitor however this does not appear to have been added back to patient's medication list. LFTs previously elevated but have been trending downwards and essentially normal with AST 42 and ALT 38.  -hold ASA given potential surgery  - consider resuming Lipitor   Hypertension: normotensive at admission. - Imdur and metoprolol   Hx of Atrial flutter: not on anticoagulation due to falls and anemia - metoprolol    Systolic CHF: Last echocardiogram January 2016 showed normal LV systolic function,Grade 1 diastolic dysfunction, moderate left atrial enlargement and trace mitral regurgitation. Nuclear study January 2016 showed ejection fraction 58%. Clinically mildly dry. Patient was instructed to resume Lasix but this was not on home medication list at time of admission. - held off on ordering Lasix as patient appears to be mildly dry at admission - daily weights and strict I/Os   Anemia: Normocytic. HgB 9.9 consistent with levels at recent hospitalization. Prior baseline appears to be 10.5-12. Negative FOBT 6/17.  -CBC in AM  -consider further work up   Depression: - continue Zoloft   Asthma/COPD: Reports intermittent use of home O2 at 2L by [place].  -continue Dulera (formulary substitute for Symbicort)  -albuterol and tiotroprium PRN  Peripheral Neuropathy:  - continue Tegretol   BPH with history of urinary retention:   - cont finasteride and Flomax    FEN/GI: NPO, NS at 100 cc/hr  Prophylaxis: SCDs, Lovenox ordered to start evening of 9/27   Disposition: Admit to FPTS;  Attending [place]   History of Present Illness:  [name] is a 82 y.o. male presenting with fevers. Reports he has been having  intermittent fevers for past several days after biliary tube evaluated/changed by IR on 9/21. Had previously been doing well. Reports temperatures up to 102 degrees at home. Was instructed to come to ED for evaluation by IR physician after calling their clinic earlier today. Reports that drainage from biliary drain has appeared darker and with a more foul smelling odor recently. Has had increased pain around drain site. Minimal relief with Tylenol. Wife changes his bandages over drain so he is unsure if he has had erythema at the site but says wife has told him that it appears unchanged since placement. Reports decreased appetite and PO intake secondary to pain.   In the ED, patient had CT abdomen to evaluate for abscess. CT was unremarkable. Pain control was an issue and patient received Morphine 4 mg x2 doses and Dilaudid IV 1 mg. Patient reports he is much more comfortable after these medications.   Review Of Systems: Per HPI with the following additions:   Review of Systems  Constitutional: Positive for chills, fever and malaise/fatigue.  Respiratory: Negative for cough.   Cardiovascular: Negative for chest pain.  Gastrointestinal: Positive for abdominal pain. Negative for nausea and vomiting.  Genitourinary: Negative for dysuria and hematuria.    Patient Active Problem List   Diagnosis Date Noted  . Fever 05/26/2016  . 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 Cholangitis   . Peri-ampullary neoplasm   . Malnutrition of moderate degree 04/29/2016  . Bile duct obstruction   . Elevated liver function tests   . Bacteremia due to Gram-negative bacteria ([place]) 04/27/2016  . BPH (benign prostatic hyperplasia)   . Asthma, mild intermittent   . Insomnia 02/18/2016  . DOE (dyspnea on exertion)   . Pacemaker 12/25/2015  . Atrial flutter, RVR 04/10/2015  . Unstable angina ([place]) 04/10/2015  . Coronary artery disease involving native coronary artery of native heart without angina pectoris 04/10/2015  .  Urinary retention due to benign prostatic hyperplasia   . Systolic CHF ([place]) [id number]  . Depression 09/26/2013  . Intertrochanteric fracture of right hip ([place]) 08/29/2013  . Hypertension   . GERD (gastroesophageal reflux disease)   . COPD (chronic obstructive pulmonary disease) ([place])   . H/O cardiac arrest   . Junctional bradycardia 07/25/2012  . Glucose intolerance (impaired glucose tolerance) 07/19/2012  . Normocytic anemia 07/19/2012  . Acute MI, anterolateral wall ([place]) 07/16/2012  . Prostatitis 07/16/2012  . E. coli UTI (urinary tract infection) 07/16/2012  . Elevated transaminase level 07/16/2012  . Foot drop, right 07/28/2011  . LBBB (left bundle branch block) 03/29/2011  . Hx of CABG and re-do CABG  03/29/2011  . Ischemic heart disease   . Myocardial infarction ([place])   . Acute systolic congestive heart failure, NYHA class 2-EF 40-45%   . Chronic respiratory failure ([place])   . Chronic asthma   . Peripheral neuropathy ([place])   . Dyslipidemia (high LDL; low HDL)   . Essential hypertension 04/04/2007  . PSORIASIS 04/04/2007  . OSTEOARTHRITIS 04/04/2007  . OSTEOPOROSIS 04/04/2007  . Coronary artery disease due to lipid rich plaque 01/05/2007  . HERNIA 01/05/2007    Past Medical History: Past Medical History:  Diagnosis Date  . Anemia 2014   was on iron pills and then was able to come off  . Arthritis    "all over"  . Asthma       .  Atrial flutter ([place])    a. diagnosed 03/2015  . Bacteremia 03/2016  . Complication of anesthesia    B/P dropped low and had to stay in recovery longer  . Coronary artery disease    a. s/p CABG 1986 b. redo CABG 1993 c. PTCA 2008 d. DES 2010  . Depression       . Diverticulosis   . GERD (gastroesophageal reflux disease)       . Hepatitis    hepatitis- long time ago, occupational contamination   . Hiatal hernia   . Hyperlipemia   . Hypertension       . Insomnia       . Kidney stones   . Myocardial infarction ([place])   . On home  oxygen therapy    "2L; just at night" (01/31/2015)  . Peripheral neuropathy ([place])   . Peripheral vascular disease (HCC)    thrombus- in leg- many yrs. ago- ?R- coumadin x1 mth  . Pneumonia 1960's  . Presence of permanent cardiac pacemaker   . Prostate cancer [facility])    radiation therapy- 2002  . Systolic CHF ([place])       . VT (ventricular tachycardia) ([place]) 06/2012   a. in the setting of STEMI b. on amio during hospitalization    Past Surgical History: Past Surgical History:  Procedure Laterality Date  . APPENDECTOMY  1943  . CATARACT EXTRACTION W/ INTRAOCULAR LENS  IMPLANT, BILATERAL    . COLONOSCOPY    . CORONARY ARTERY BYPASS GRAFT  1986;  1993   CABG X 5; CABG X 1  . CYSTOSCOPY W/ URETERAL STENT PLACEMENT Right 02/28/2013   Procedure: CYSTOSCOPY WITH RETROGRADE PYELOGRAM/URETERAL STENT PLACEMENT;  Surgeon: Dutch Gray, MD;  Location: [facility];  Service: Urology;  Laterality: Right;  . CYSTOSCOPY WITH URETEROSCOPY AND STENT PLACEMENT Right 03/23/2013   Procedure: RIGHT URETEROSCOPY, LASER LITHO AND STENT PLACEMENT;  Surgeon: Fredricka Bonine, MD;  Location: [facility];  Service: Urology;  Laterality: Right;  . EP IMPLANTABLE DEVICE N/A 04/11/2015   Procedure: Pacemaker Implant;  Surgeon: Will Meredith Leeds, MD;  Location: [place] CV LAB;  Service: Cardiovascular;  Laterality: N/A;  . ERCP N/A 04/30/2016   Procedure: ENDOSCOPIC RETROGRADE CHOLANGIOPANCREATOGRAPHY (ERCP);  Surgeon: Milus Banister, MD;  Location: [place];  Service: Endoscopy;  Laterality: N/A;  . FOOT FUSION Left 2000   heel  . FRACTURE SURGERY     heel- crushed -2002,  (hardware)   . HAMMER TOE SURGERY Right    little toe  . HARDWARE REMOVAL Right 2002   great toe; Dr. Sharol Given  . HOLMIUM LASER APPLICATION Right [id number]   Procedure: HOLMIUM LASER APPLICATION;  Surgeon: Fredricka Bonine, MD;  Location: [facility];  Service: Urology;  Laterality: Right;  . INGUINAL HERNIA REPAIR Left 01/31/2015   Procedure:  OPEN REPAIR RECURRENT LEFT INGUINAL HERNIA;  Surgeon: Fanny Skates, MD;  Location: [place];  Service: General;  Laterality: Left;  . INSERTION OF MESH Left 01/31/2015   Procedure: INSERTION OF MESH;  Surgeon: Fanny Skates, MD;  Location: [place];  Service: General;  Laterality: Left;  . INTRAMEDULLARY (IM) NAIL INTERTROCHANTERIC Right 08/29/2013   Procedure: INTRAMEDULLARY (IM) NAIL INTERTROCHANTRIC;  Surgeon: Marianna Payment, MD;  Location: [place];  Service: Orthopedics;  Laterality: Right;  . IR GENERIC HISTORICAL  05/01/2016   IR INT EXT BILIARY DRAIN WITH CHOLANGIOGRAM 05/01/2016 Corrie Mckusick, DO [facility]-INTERV RAD  . JOINT REPLACEMENT     R great toe  . KNEE  ARTHROSCOPY Right X 2  . LEFT AND RIGHT HEART CATHETERIZATION WITH CORONARY ANGIOGRAM N/A 07/15/2012   Procedure: LEFT AND RIGHT HEART CATHETERIZATION WITH CORONARY ANGIOGRAM;  Surgeon: Jettie Booze, MD;  Location: [facility] CATH LAB;  Service: Cardiovascular;  Laterality: N/A;  . LUMBAR LAMINECTOMY/DECOMPRESSION MICRODISCECTOMY  08/10/2011   Procedure: LUMBAR LAMINECTOMY/DECOMPRESSION MICRODISCECTOMY;  Surgeon: Cooper Render Pool;  Location: [place] NEURO ORS;  Service: Neurosurgery;  Laterality: Right;  RIGHT Lumbar five-sacral one LAMINECTOMY, MICRODISCECTOMY  . LUMBAR WOUND DEBRIDEMENT  08/22/2011   Procedure: LUMBAR WOUND DEBRIDEMENT;  Surgeon: Eustace Moore;  Location: [place] NEURO ORS;  Service: Neurosurgery;  Laterality: N/A;  Repair of CSF Leak requiring laminectomy  . PERCUTANEOUS CORONARY STENT INTERVENTION (PCI-S) N/A 07/15/2012   Procedure: PERCUTANEOUS CORONARY STENT INTERVENTION (PCI-S);  Surgeon: Jettie Booze, MD;  Location: [facility] CATH LAB;  Service: Cardiovascular;  Laterality: N/A;  . RHINOPLASTY  [id number]  . TEE WITHOUT CARDIOVERSION N/A 04/28/2016   Procedure: TRANSESOPHAGEAL ECHOCARDIOGRAM (TEE);  Surgeon: Skeet Latch, MD;  Location: [facility] ENDOSCOPY;  Service: Cardiovascular;  Laterality: N/A;  . TONSILLECTOMY  1940  . TRANSURETHRAL  RESECTION OF PROSTATE  x2    Social History: Social History  Substance Use Topics  . Smoking status: Never Smoker  . Smokeless tobacco: Never Used  . Alcohol use No   Additional social history: Denies EtOH and drug use.   Please also refer to relevant sections of EMR.  Family History: Family History  Problem Relation Age of Onset  . Heart attack Mother   . Addison's disease Father   . Heart attack Sister   . Anesthesia problems Neg Hx   . Hypotension Neg Hx   . Malignant hyperthermia Neg Hx   . Pseudochol deficiency Neg Hx      Allergies and Medications: No Known Allergies No current facility-administered medications on file prior to encounter.    Current Outpatient Prescriptions on File Prior to Encounter  Medication Sig Dispense Refill  . albuterol (PROVENTIL HFA;VENTOLIN HFA) 108 (90 Base) MCG/ACT inhaler Inhale 2 puffs into the lungs every 6 (six) hours as needed for wheezing or shortness of breath. 1 Inhaler 0  . aspirin EC 81 MG EC tablet Take 1 tablet (81 mg total) by mouth daily.    . budesonide-formoterol (SYMBICORT) 80-4.5 MCG/ACT inhaler Inhale 2 puffs into the lungs 2 (two) times daily.    . Ca Phosphate-Cholecalciferol (CALTRATE GUMMY BITES) 250-400 MG-UNIT CHEW Chew 2 tablets by mouth 2 (two) times daily.    . carbamazepine (TEGRETOL) 100 MG chewable tablet Chew 300 mg by mouth 2 (two) times daily.    [name] docusate sodium (COLACE) 100 MG capsule Take 100 mg by mouth every evening.     . feeding supplement, ENSURE ENLIVE, (ENSURE ENLIVE) LIQD Take 237 mLs by mouth 2 (two) times daily between meals. 237 mL 12  . finasteride (PROSCAR) 5 MG tablet Take 5 mg by mouth every morning.     [name] ipratropium (ATROVENT HFA) 17 MCG/ACT inhaler Inhale 2 puffs into the lungs 2 (two) times daily as needed for wheezing.     [name] ipratropium (ATROVENT) 0.02 % nebulizer solution Take 2.5 mLs (0.5 mg total) by nebulization 2 (two) times daily as needed for wheezing or shortness of breath.  75 mL 12  . isosorbide mononitrate (IMDUR) 30 MG 24 hr tablet Take 1 tablet (30 mg total) by mouth daily. 30 tablet 0  . Melatonin 3 MG TABS Take 3 mg by mouth at bedtime.    [name]  metoprolol succinate (TOPROL-XL) 12.5 mg TB24 24 hr tablet Take 12.5 mg by mouth daily.    . Multiple Vitamins-Minerals (MULTIVITAMIN WITH MINERALS) tablet Take 1 tablet by mouth every evening.     . potassium chloride SA (K-DUR,KLOR-CON) 20 MEQ tablet Take 20 mEq by mouth daily.     . ranitidine (ZANTAC) 150 MG tablet TAKE 1 TABLET BY MOUTH 2 TIMES DAILY. (Patient taking differently: Take 300 mg by mouth at bedtime) 180 tablet 3  . senna (SENOKOT) 8.6 MG tablet Take 1 tablet by mouth daily.     . sertraline (ZOLOFT) 100 MG tablet Take 150 mg by mouth every morning.     . tamsulosin (FLOMAX) 0.4 MG CAPS capsule Take 1 capsule (0.4 mg total) by mouth daily. 30 capsule 0  . traZODone (DESYREL) 50 MG tablet Take 1 tablet (50 mg total) by mouth at bedtime. 30 tablet 1  . Amino Acids-Protein Hydrolys (FEEDING SUPPLEMENT, PRO-STAT SUGAR FREE 64,) LIQD Take 30 mLs by mouth 2 (two) times daily. (Patient not taking: Reported on 05/25/2016) 900 mL 0  . nitroGLYCERIN (NITROSTAT) 0.4 MG SL tablet Place 1 tablet (0.4 mg total) under the tongue every 5 (five) minutes as needed for chest pain (As needed for chest pain). 25 tablet 3    Objective: BP 118/74   Pulse 80   Temp 99.4 F (37.4 C) (Oral)   Resp 19   Ht 5\' 10"  (1.778 m)   Wt 154 lb (69.9 kg)   SpO2 98%   BMI 22.10 kg/m  Exam: General: Lying in bed, NAD  Eyes: EOMI. No scleral icterus. PERRL.  ENTM: Dry mucus membranes. Oropharynx clear.  Cardiovascular: RRR. No murmurs appreciated. No LE edema. 2+ pedal pulses.  Respiratory: CTAB. Normal WOB.  Gastrointestinal: +BS, mild TTP around biliary drain insertion, drain in place with brown fluid in bag, drain without surrounding erythema, no rebound or guarding  MSK: Moves all extremities spontaneously.  Derm: No visible  rashes. Warm and dry.  Neuro: No focal deficits. Alert and oriented.  Psych: Normal mood and affect. Pleasant.   Labs and Imaging: CBC BMET   Recent Labs Lab 05/25/16 1711  WBC 10.1  HGB 9.9*  HCT 31.2*  PLT 331    Recent Labs Lab 05/25/16 1711  NA 134*  K 4.2  CL 99*  CO2 26  BUN 30*  CREATININE 0.89  GLUCOSE 115*  CALCIUM 9.7      Ct Abdomen Pelvis W Contrast  Result Date: 05/26/2016 CLINICAL DATA:  Acute onset of fever and worsening right upper quadrant abdominal pain. Status post internal/external biliary drain placement. Initial encounter. Initial encounter. EXAM: CT ABDOMEN AND PELVIS WITH CONTRAST TECHNIQUE: Multidetector CT imaging of the abdomen and pelvis was performed using the standard protocol following bolus administration of intravenous contrast. CONTRAST:  147mL ISOVUE-300 IOPAMIDOL (ISOVUE-300) INJECTION 61% COMPARISON:  CT of the abdomen and pelvis performed 04/28/2016 FINDINGS: Lower chest: Mild right basilar atelectasis is noted, with trace associated fluid. Mild scarring is noted at the left lung base. Diffuse coronary artery calcifications are seen. Hepatobiliary: There has been interval placement of a right-sided internal/external biliary drain, noted ending at the second segment of the duodenum and passing through the liver and common bile duct. The common bile duct is decompressed, and associated pneumobilia is noted. The gallbladder is contracted and grossly unremarkable. No focal fluid collections are seen. Pancreas: There is persistent dilatation of the pancreatic duct to 1.5 cm, relatively stable from the prior study. The previously  diagnosed duodenal tubulovillous adenoma is not well characterized. Spleen: The spleen is unremarkable in appearance. Adrenals/Urinary Tract: The adrenal glands are unremarkable. Scattered bilateral renal cysts are seen. A nonobstructing 5 mm stone is noted at the lower pole of the right kidney. There is no evidence of  hydronephrosis. No obstructing ureteral stones are identified. Mild perinephric stranding is noted bilaterally. Stomach/Bowel: The stomach is unremarkable in appearance. The small bowel is within normal limits. The patient is status post appendectomy. Scattered diverticulosis is noted at the sigmoid colon, without evidence of diverticulitis. Vascular/Lymphatic: Scattered calcification is seen along the abdominal aorta and its branches. The abdominal aorta is otherwise grossly unremarkable. The inferior vena cava is grossly unremarkable. No retroperitoneal lymphadenopathy is seen. No pelvic sidewall lymphadenopathy is identified. Reproductive: The bladder is mildly distended and grossly unremarkable. The prostate remains normal in size. Other: No additional soft tissue abnormalities are seen. Musculoskeletal: No acute osseous abnormalities are identified. The patient's right femoral hardware is incompletely imaged but appears grossly unremarkable. The visualized musculature is unremarkable in appearance. There is grade 2 anterolisthesis of L5 on S1, reflecting facet disease. IMPRESSION: 1. Interval placement of right-sided internal/external biliary drain, noted ending at the second segment of the duodenum and passing through the liver and common bile duct. The common bile duct is decompressed, and associated pneumobilia is seen. The gallbladder is contracted and grossly unremarkable. No focal fluid collections seen. 2. Persistent dilatation of the pancreatic duct to 1.5 cm, relatively stable from the prior study. 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 3. Mild right basilar atelectasis, with trace associated fluid. Mild scarring at the left lung base. 4. Diffuse coronary artery calcifications seen. 5. Scattered aortic atherosclerosis. 6. Nonobstructing 5 mm stone at the lower pole of the right kidney. Scattered bilateral renal cysts seen. 7. Grade 2 anterolisthesis of L5 on S1,  reflecting underlying facet disease. 8. Scattered diverticulosis of the sigmoid colon, without evidence of diverticulitis. Electronically Signed   By: Garald Balding M.D.   On: 05/26/2016 02:22     Nicolette Bang, DO 05/26/2016, 3:29 AM PGY-2, [place] Intern pager: [phone], text pages welcome

## 2016-05-26 NOTE — Progress Notes (Signed)
md paged and made aware of chest xray results.

## 2016-05-26 NOTE — Progress Notes (Signed)
  Interval Progress Note  Was notified by RN that Mr. Winterrowd family had concerns before being discharged. Went to speak with the patient and his family and his family felt he was more confused than usual. He had received dilaudid around noon and has become more confused since then. He answered my questions appropriately and was oriented to person and place, not time. He has received a total of 3mg  of dilaudid since 1am. Do not feel that Narcan is necessary at this time as it has been 3 hours since he received medication and he is not in respiratory distress and he is not somnolent. We will hold any further pain medicines and monitor for improvement over the next 1-2 hours as this should be clearing from his system by then. Will check CBG as well. Will reassess in 1-2 hours and if he is okay can discharge home as planned.   Lucila Maine, [facility], PGY-1

## 2016-05-26 NOTE — ED Provider Notes (Signed)
Patient with a history of benign biliary tumor causing biliary obstruction. Attempt at putting in a stent was unsuccessful and IR put in a drain 3 weeks ago as a temporary measure until re-attempt at stenting care be done. He presents with intermittent fever x 1 week with progressively worsening pain around the drain site and sludge visualized in the tubing today.   CT pending to evaluation for abscess. Does not appear septic, labs unremarkable. No source of fever identified.   Plan: reassess patient after CT resulted with likely admission for further observation and treatment based on CT results.  CT scan negative for abscess or inflammation. Discussed with [facility] resident who accepts for admission for pain control and to monitor fever.    Charlann Lange, PA-C 05/26/16 [place], MD 05/26/16 [phone]

## 2016-05-26 NOTE — Progress Notes (Signed)
rn talked with family and pt, they all would be more comfortable with pt staying the night and making sure the confusion is gone.   md paged about this.

## 2016-05-26 NOTE — Progress Notes (Signed)
Pt came back from radiology with skin tear to right forearm. Dressing applied.

## 2016-05-27 DIAGNOSIS — I4892 Unspecified atrial flutter: Secondary | ICD-10-CM | POA: Diagnosis present

## 2016-05-27 DIAGNOSIS — T8579XA Infection and inflammatory reaction due to other internal prosthetic devices, implants and grafts, initial encounter: Secondary | ICD-10-CM | POA: Diagnosis present

## 2016-05-27 DIAGNOSIS — M810 Age-related osteoporosis without current pathological fracture: Secondary | ICD-10-CM | POA: Diagnosis present

## 2016-05-27 DIAGNOSIS — G629 Polyneuropathy, unspecified: Secondary | ICD-10-CM | POA: Diagnosis present

## 2016-05-27 DIAGNOSIS — J9610 Chronic respiratory failure, unspecified whether with hypoxia or hypercapnia: Secondary | ICD-10-CM | POA: Diagnosis present

## 2016-05-27 DIAGNOSIS — I2583 Coronary atherosclerosis due to lipid rich plaque: Secondary | ICD-10-CM | POA: Diagnosis present

## 2016-05-27 DIAGNOSIS — R509 Fever, unspecified: Secondary | ICD-10-CM | POA: Diagnosis present

## 2016-05-27 DIAGNOSIS — I5022 Chronic systolic (congestive) heart failure: Secondary | ICD-10-CM

## 2016-05-27 DIAGNOSIS — K831 Obstruction of bile duct: Secondary | ICD-10-CM | POA: Diagnosis present

## 2016-05-27 DIAGNOSIS — K835 Biliary cyst: Secondary | ICD-10-CM | POA: Diagnosis not present

## 2016-05-27 DIAGNOSIS — J42 Unspecified chronic bronchitis: Secondary | ICD-10-CM

## 2016-05-27 DIAGNOSIS — J9811 Atelectasis: Secondary | ICD-10-CM | POA: Diagnosis present

## 2016-05-27 DIAGNOSIS — F329 Major depressive disorder, single episode, unspecified: Secondary | ICD-10-CM | POA: Diagnosis present

## 2016-05-27 DIAGNOSIS — I2510 Atherosclerotic heart disease of native coronary artery without angina pectoris: Secondary | ICD-10-CM | POA: Diagnosis present

## 2016-05-27 DIAGNOSIS — K219 Gastro-esophageal reflux disease without esophagitis: Secondary | ICD-10-CM | POA: Diagnosis present

## 2016-05-27 DIAGNOSIS — N401 Enlarged prostate with lower urinary tract symptoms: Secondary | ICD-10-CM | POA: Diagnosis present

## 2016-05-27 DIAGNOSIS — E785 Hyperlipidemia, unspecified: Secondary | ICD-10-CM | POA: Diagnosis present

## 2016-05-27 DIAGNOSIS — Z951 Presence of aortocoronary bypass graft: Secondary | ICD-10-CM | POA: Diagnosis not present

## 2016-05-27 DIAGNOSIS — I739 Peripheral vascular disease, unspecified: Secondary | ICD-10-CM | POA: Diagnosis present

## 2016-05-27 DIAGNOSIS — K838 Other specified diseases of biliary tract: Secondary | ICD-10-CM

## 2016-05-27 DIAGNOSIS — Z9981 Dependence on supplemental oxygen: Secondary | ICD-10-CM | POA: Diagnosis not present

## 2016-05-27 DIAGNOSIS — I110 Hypertensive heart disease with heart failure: Secondary | ICD-10-CM | POA: Diagnosis present

## 2016-05-27 DIAGNOSIS — R338 Other retention of urine: Secondary | ICD-10-CM | POA: Diagnosis present

## 2016-05-27 DIAGNOSIS — Z8674 Personal history of sudden cardiac arrest: Secondary | ICD-10-CM | POA: Diagnosis not present

## 2016-05-27 DIAGNOSIS — Y838 Other surgical procedures as the cause of abnormal reaction of the patient, or of later complication, without mention of misadventure at the time of the procedure: Secondary | ICD-10-CM | POA: Diagnosis present

## 2016-05-27 DIAGNOSIS — D649 Anemia, unspecified: Secondary | ICD-10-CM | POA: Diagnosis present

## 2016-05-27 DIAGNOSIS — I252 Old myocardial infarction: Secondary | ICD-10-CM | POA: Diagnosis not present

## 2016-05-27 DIAGNOSIS — J449 Chronic obstructive pulmonary disease, unspecified: Secondary | ICD-10-CM | POA: Diagnosis present

## 2016-05-27 LAB — BLOOD CULTURE ID PANEL (REFLEXED)
Acinetobacter baumannii: NOT DETECTED
CANDIDA PARAPSILOSIS: NOT DETECTED
Candida albicans: NOT DETECTED
Candida glabrata: NOT DETECTED
Candida krusei: NOT DETECTED
Candida tropicalis: NOT DETECTED
ENTEROBACTERIACEAE SPECIES: NOT DETECTED
ENTEROCOCCUS SPECIES: NOT DETECTED
Enterobacter cloacae complex: NOT DETECTED
Escherichia coli: NOT DETECTED
HAEMOPHILUS INFLUENZAE: NOT DETECTED
KLEBSIELLA OXYTOCA: NOT DETECTED
Klebsiella pneumoniae: NOT DETECTED
LISTERIA MONOCYTOGENES: NOT DETECTED
NEISSERIA MENINGITIDIS: NOT DETECTED
PSEUDOMONAS AERUGINOSA: NOT DETECTED
Proteus species: NOT DETECTED
SERRATIA MARCESCENS: NOT DETECTED
STAPHYLOCOCCUS AUREUS BCID: NOT DETECTED
STREPTOCOCCUS AGALACTIAE: NOT DETECTED
STREPTOCOCCUS PYOGENES: NOT DETECTED
Staphylococcus species: NOT DETECTED
Streptococcus pneumoniae: NOT DETECTED

## 2016-05-27 LAB — CBC
HCT: 30 % — ABNORMAL LOW (ref 39.0–52.0)
HEMOGLOBIN: 9.3 g/dL — AB (ref 13.0–17.0)
MCH: 29.2 pg (ref 26.0–34.0)
MCHC: 31 g/dL (ref 30.0–36.0)
MCV: 94 fL (ref 78.0–100.0)
Platelets: 339 10*3/uL (ref 150–400)
RBC: 3.19 MIL/uL — ABNORMAL LOW (ref 4.22–5.81)
RDW: 14.5 % (ref 11.5–15.5)
WBC: 7.1 10*3/uL (ref 4.0–10.5)

## 2016-05-27 MED ORDER — DEXTROSE 5 % IV SOLN
2.0000 g | INTRAVENOUS | Status: DC
  Administered 2016-05-27 – 2016-05-28 (×2): 2 g via INTRAVENOUS
  Filled 2016-05-27 (×2): qty 2

## 2016-05-27 MED ORDER — OXYCODONE HCL 5 MG PO TABS: 5.0000 mg | ORAL_TABLET | Freq: Four times a day (QID) | ORAL | Status: DC | PRN

## 2016-05-27 MED ORDER — ASPIRIN 81 MG PO CHEW
81.0000 mg | CHEWABLE_TABLET | Freq: Every day | ORAL | Status: DC
  Administered 2016-05-27 – 2016-05-28 (×2): 81 mg via ORAL
  Filled 2016-05-27 (×2): qty 1

## 2016-05-27 MED ORDER — TIZANIDINE HCL 4 MG PO TABS
2.0000 mg | ORAL_TABLET | Freq: Three times a day (TID) | ORAL | Status: DC | PRN
  Administered 2016-05-27: 2 mg via ORAL
  Filled 2016-05-27: qty 1

## 2016-05-27 NOTE — Progress Notes (Signed)
Intern # [phone] paged twice, awaiting call back.

## 2016-05-27 NOTE — Progress Notes (Signed)
Family Medicine Teaching Service Daily Progress Note Intern Pager: [phone]  Patient name: [name] Medical record number: [id number] Date of birth: 06-12-1925 Age: 82 y.o. Gender: male  Primary Care Provider: Bufford Lope, DO Consultants: IR Code Status: full  Pt Overview and Major Events to Date:  9/27 Admitted to [facility] for fevers 9/28 Positive blood culture 1 bottle strep species, possible contaminate  Assessment and Plan: [name] is a 82 y.o. male presenting with fever. PMH is significant for HTN, CAD s/p CABG 1986 with redo 1993, PTCA 2008, and DES 2010, hx MI, LBBB, HFrEF, Asthma/COPD, GERD, OA, Osteoporosis, Dyslipidemia, Peripheral Neuropathy, BPH with history or urinary retention, Pacemaker for tachy brady syndrome (03/2015), History of atrial flutter (no anticoagulation due to falls and anemia)   Fever: Tmax 100.8. Afebrile since admission. Lactic acid normal. WBC normal. Vital signs have been within normal limits. Drain site without significant erythema, no drainage around incision, bag filled with green-brown bile. CT abdomen was negative.  -monitor fever curve, has continued to be afebrile -CXR with right basilar atelectasis or pneumonia posteriorly and small right pleural effusion, favoring atelectasis as patient cannot take deep breaths, no cough or sputum, no white count, has been afebrile -blood cultures positive in 1 bottle for strep species, possible contaminate but started on Rocephin 2 g q12H -will repeat blood cultures tonight, continue to monitor for fevers  CBD Mass: CA 19-9 elevated to 91 at previous admission. Biopsy showed duodenal tubulovillous adenoma. Biliary drain in place. IR planning for stent internalization on 10/3.   -Oxycodone 5mg  q6h as needed for pain -tylenol and ibuprofen as needed for pain -per IR, no need to culture bile, keep date for stent Oct. 3rd, can consider swapping tube if fevers continue but will hold off for now  CAD: CABG  1986 with redo 1993, PTCA 2008, and DES 2010. Review of phone records with PCP show that patient was instructed to resume Lipitor however this does not appear to have been added back to patient's medication list. LFTs previously elevated but have been trending downwards and essentially normal with AST 42 and ALT 38.  -continue ASA -consider restarting Lipitor  Systolic CHF -held home lasix, patient appears dry on exam -daily weights, strict I/O's  HTN- SBPs 120-130's -continue imdur and metoprolol -continue to monitor  Anemia -Hgb this morning 9.3 -consider further work up  -monitor daily -threshold for transfusion 8  Depression: - continue Zoloft   Asthma/COPD: Reports intermittent use of home O2 at 2L by [place].  -continue Dulera (formulary substitute for Symbicort)  -albuterol and tiotroprium PRN  Peripheral Neuropathy: - continue Tegretol   BPH with history of urinary retention:  - cont finasteride and Flomax   FEN/GI: regular diet PPx: lovenox  Disposition: continue to monitor, home once stable  Subjective:  Mr. [name] is doing well today, he feels much less confused and less drowsy today. Denies fevers or chills. Still has pain at drain site that is worse with deep breaths. Otherwise doing well.  Objective: Temp:  [97.6 F (36.4 C)-99.2 F (37.3 C)] 99.2 F (37.3 C) (09/28 0700) Pulse Rate:  [66-81] 74 (09/28 0700) Resp:  [17-20] 18 (09/28 0700) BP: (115-134)/(35-63) 127/47 (09/28 0700) SpO2:  [95 %-99 %] 96 % (09/28 0700) Weight:  [153 lb 9.6 oz (69.7 kg)] 153 lb 9.6 oz (69.7 kg) (09/28 0500) Physical Exam: General: elderly thin man laying in bed in no acute distress Cardiovascular: RRR no murmurs rubs or gallops Respiratory: CTA bilaterally  no increased work of breathing Abdomen: soft, tender at drain site to palpation, non-distended, +BS Extremities: thin, no edema or cyanosis  Laboratory:  Recent Labs Lab 05/25/16 1711 05/26/16 0455  WBC 10.1  8.7  HGB 9.9* 9.7*  HCT 31.2* 31.6*  PLT 331 335    Recent Labs Lab 05/25/16 1711 05/26/16 0455  NA 134* 135  K 4.2 4.2  CL 99* 100*  CO2 26 24  BUN 30* 24*  CREATININE 0.89 0.73  CALCIUM 9.7 9.9  PROT 7.3  --   BILITOT 0.5  --   ALKPHOS 227*  --   ALT 38  --   AST 42*  --   GLUCOSE 115* 99    Imaging/Diagnostic Tests: X-ray Chest Pa And Lateral  Result Date: 05/26/2016 CLINICAL DATA:  Fever, weakness. History of CHF, cardiac dysrhythmia, as CABG. EXAM: CHEST  2 VIEW COMPARISON:  Chest x-ray of April 27, 2016 FINDINGS: The lungs are less well inflated today. There is a small right pleural effusion. The interstitial markings are more conspicuous at the right lung base. The cardiac silhouette is mildly enlarged. The pulmonary vascularity is not engorged. There are post CABG changes. There is calcification in the wall of the aortic arch. The permanent pacemaker is in stable position. The bony thorax exhibits no acute abnormality. An external biliary drainage tube is visible on the right. IMPRESSION: Right basilar atelectasis or pneumonia posteriorly. Small right pleural effusion. Stable cardiomegaly without pulmonary vascular congestion. Aortic atherosclerosis. Electronically Signed   By: David  Martinique M.D.   On: 05/26/2016 09:59    Steve Rattler, DO 05/27/2016, 8:08 AM PGY-1, [place] Intern pager: [phone], text pages welcome

## 2016-05-27 NOTE — Progress Notes (Signed)
Transitions of Care Pharmacy Note  Plan:  Educated on importance of following discharge summary when leaving hospital to determine medications he should be taking or stopping.   [name] is an 82 y.o. male who presents with a chief complaint of fever. In anticipation of discharge, pharmacy has reviewed this patient's prior to admission medication history, as well as current inpatient medications listed per the [facility].  Current medication indications, dosing, frequency, and notable side effects reviewed with patient and family. patient verbalized understanding of current inpatient medication regimen and is aware that the After Visit Summary when presented, will represent the most accurate medication list at discharge.   [name] expressed concerns regarding pain management. Discussed importance of pain control while balancing adverse effects such as delirium and falls, which opioids can cause. He had first dose of tizanidine for muscle spasms this afternoon, which he could not tell if it was working.   Assessment: Understanding of regimen: good Understanding of indications: good Potential of compliance: good Barriers to Obtaining Medications: No  Patient instructed to contact inpatient pharmacy team with further questions or concerns if needed.    Time spent preparing for discharge counseling: 15 minutes Time spent counseling patient: 15 minutes   Thank you for allowing pharmacy to be a part of this patient's care.  Demetrius Charity, PharmD Acute Care Pharmacy Resident  Pager: [phone] 05/27/2016

## 2016-05-27 NOTE — Progress Notes (Signed)
PHARMACY - PHYSICIAN COMMUNICATION CRITICAL VALUE ALERT - BLOOD CULTURE IDENTIFICATION (BCID)  Results for orders placed or performed during the hospital encounter of 05/25/16  Blood Culture ID Panel (Reflexed) (Collected: 05/26/2016  5:05 AM)  Result Value Ref Range   Enterococcus species NOT DETECTED NOT DETECTED   Listeria monocytogenes NOT DETECTED NOT DETECTED   Staphylococcus species NOT DETECTED NOT DETECTED   Staphylococcus aureus NOT DETECTED NOT DETECTED   Streptococcus species (A) NOT DETECTED    CRITICAL RESULT CALLED TO, READ BACK BY AND VERIFIED WITH:   Streptococcus agalactiae NOT DETECTED NOT DETECTED   Streptococcus pneumoniae NOT DETECTED NOT DETECTED   Streptococcus pyogenes NOT DETECTED NOT DETECTED   Acinetobacter baumannii NOT DETECTED NOT DETECTED   Enterobacteriaceae species NOT DETECTED NOT DETECTED   Enterobacter cloacae complex NOT DETECTED NOT DETECTED   Escherichia coli NOT DETECTED NOT DETECTED   Klebsiella oxytoca NOT DETECTED NOT DETECTED   Klebsiella pneumoniae NOT DETECTED NOT DETECTED   Proteus species NOT DETECTED NOT DETECTED   Serratia marcescens NOT DETECTED NOT DETECTED   Haemophilus influenzae NOT DETECTED NOT DETECTED   Neisseria meningitidis NOT DETECTED NOT DETECTED   Pseudomonas aeruginosa NOT DETECTED NOT DETECTED   Candida albicans NOT DETECTED NOT DETECTED   Candida glabrata NOT DETECTED NOT DETECTED   Candida krusei NOT DETECTED NOT DETECTED   Candida parapsilosis NOT DETECTED NOT DETECTED   Candida tropicalis NOT DETECTED NOT DETECTED    Name of physician (or Provider) Contacted: Dr Vanetta Shawl  Changes to prescribed antibiotics required: Add Rocephin 2g IV Q24H.  Wynona Neat, PharmD, BCPS  05/27/2016  7:58 AM

## 2016-05-27 NOTE — Progress Notes (Signed)
Pt complaining of spasms to right side near biliary drainage tube. Site assessed, dry and intact. Pt refusing pain medication requesting muscle relaxant instead. Md paged twice, awaiting call back.

## 2016-05-28 ENCOUNTER — Inpatient Hospital Stay: Payer: Medicare Other | Admitting: Family Medicine

## 2016-05-28 DIAGNOSIS — D649 Anemia, unspecified: Secondary | ICD-10-CM

## 2016-05-28 LAB — CBC
HCT: 27.1 % — ABNORMAL LOW (ref 39.0–52.0)
Hemoglobin: 8.4 g/dL — ABNORMAL LOW (ref 13.0–17.0)
MCH: 28.8 pg (ref 26.0–34.0)
MCHC: 31 g/dL (ref 30.0–36.0)
MCV: 92.8 fL (ref 78.0–100.0)
PLATELETS: 322 10*3/uL (ref 150–400)
RBC: 2.92 MIL/uL — ABNORMAL LOW (ref 4.22–5.81)
RDW: 14.6 % (ref 11.5–15.5)
WBC: 7.3 10*3/uL (ref 4.0–10.5)

## 2016-05-28 MED ORDER — TIZANIDINE HCL 2 MG PO TABS: 2.0000 mg | ORAL_TABLET | Freq: Three times a day (TID) | ORAL | 0 refills | Status: DC | PRN

## 2016-05-28 MED ORDER — ALUM & MAG HYDROXIDE-SIMETH 200-200-20 MG/5ML PO SUSP
15.0000 mL | ORAL | Status: DC | PRN
  Administered 2016-05-28: 15 mL via ORAL
  Filled 2016-05-28: qty 30

## 2016-05-28 MED ORDER — CIPROFLOXACIN HCL 500 MG PO TABS
750.0000 mg | ORAL_TABLET | Freq: Two times a day (BID) | ORAL | Status: DC
  Administered 2016-05-28: 750 mg via ORAL
  Filled 2016-05-28: qty 1

## 2016-05-28 NOTE — Discharge Summary (Signed)
[facility] Discharge Summary  Patient name: [name] Medical record number: [id number] Date of birth: 1925/05/08 Age: 82 y.o. Gender: male Date of Admission: 05/25/2016  Date of Discharge: 05/28/16  Admitting Physician: Shane Resides, MD  Primary Care Provider: Bufford Lope, DO Consultants: IR  Indication for Hospitalization: fevers  Discharge Diagnoses/Problem List:   Patient Active Problem List   Diagnosis Date Noted  . Chronic systolic congestive heart failure ([place])   . Chronic bronchitis ([place])   . Common bile duct mass   . Fever 05/26/2016  . Pyrexia   . Biliary drain displacement   . 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 Cholangitis   . Peri-ampullary neoplasm   . Malnutrition of moderate degree 04/29/2016  . Common bile duct obstruction   . Elevated liver function tests   . Bacteremia due to Gram-negative bacteria ([place]) 04/27/2016  . BPH (benign prostatic hyperplasia)   . Asthma, mild intermittent   . Insomnia 02/18/2016  . DOE (dyspnea on exertion)   . Pacemaker 12/25/2015  . Atrial flutter, RVR 04/10/2015  . Unstable angina ([place]) 04/10/2015  . Coronary artery disease involving native coronary artery of native heart without angina pectoris 04/10/2015  . Urinary retention due to benign prostatic hyperplasia   . Systolic CHF ([place]) 123XX123  . Depression 09/26/2013  . Intertrochanteric fracture of right hip ([place]) 08/29/2013  . Hypertension   . GERD (gastroesophageal reflux disease)   . COPD (chronic obstructive pulmonary disease) ([place])   . H/O cardiac arrest   . Junctional bradycardia 07/25/2012  . Glucose intolerance (impaired glucose tolerance) 07/19/2012  . Normocytic anemia 07/19/2012  . Acute MI, anterolateral wall ([place]) 07/16/2012  . Prostatitis 07/16/2012  . E. coli UTI (urinary tract infection) 07/16/2012  . Elevated transaminase level 07/16/2012  . Foot drop, right 07/28/2011  . LBBB (left bundle branch block)  03/29/2011  . Hx of CABG and re-do CABG  03/29/2011  . Ischemic heart disease   . Myocardial infarction ([place])   . Acute systolic congestive heart failure, NYHA class 2-EF 40-45%   . Chronic respiratory failure ([place])   . Chronic asthma   . Peripheral neuropathy ([place])   . Dyslipidemia (high LDL; low HDL)   . Essential hypertension 04/04/2007  . PSORIASIS 04/04/2007  . OSTEOARTHRITIS 04/04/2007  . OSTEOPOROSIS 04/04/2007  . Coronary artery disease due to lipid rich plaque 01/05/2007  . HERNIA 01/05/2007    Disposition: home  Discharge Condition: stable, improved  Discharge Exam:  General: elderly thin man laying in bed in no acute distress Cardiovascular: RRR no murmurs rubs or gallops Respiratory: CTA bilaterally no increased work of breathing Abdomen: soft, tender at drain site to palpation, non-distended, +BS Extremities: thin, no edema or cyanosis  Brief Hospital Course:   [name] is an 82 year old male who was admitted due to pain and fevers with concern for infection due to percutaneous transhepatic biliary drain that was placed during his last hospitalization. PMH is significant for HTN, CAD s/p CABG 1986 with redo 1993, PTCA 2008, and DES 2010, hx MI, LBBB, HFrEF, Asthma/COPD, GERD, OA, Osteoporosis, Dyslipidemia, Peripheral Neuropathy, BPH with history or urinary retention, Pacemaker for tachy brady syndrome (03/2015), History of atrial flutter (no anticoagulation due to falls and anemia)   Patient had reported decrease appetite and intermittent fevers at home up to 101.43F and was instructed to come to the ED for concern of infection due to biliary drain. Patient was febrile once to 100.55F in the  ED but with otherwise normal temperatures. Vitals were within normal limits. WBC was normal. Lactic acid level was normal. CT abdomen negative for abscess. Urinalysis was negative. CXR showed right basilar atelectasis vs possible pneumonia, however thought more likely to be  atelectasis due to patients difficulty taking deep breathing due to pain from drain. He denied cough. Drain site without erythema or signs of infection. Interventional radiology was contacted and recommended proceeding with stent placement as scheduled for October 3rd. Pain is to be expected at the drain site. IR did not want to swap the tube out at this time as it was in a good position and he had remained afebrile. Recommended a course of antibiotics and to monitor for further fevers but otherwise cleared him for discharge. Patient received IV 1mg  dilaudid q4 hours for pain. He was started on oral Ciprofloxacin and Flagyl and remained afebrile. On 9/27 patient was noted to be drowsy and, per family, seemed more confused, after receiving 1 mg of dilaudid. He was appropriately oriented to person and place, but not year. The decision was made to monitor him overnight. The dilaudid was held. Patient's mentation improved by the following morning and he remained appropriately oriented for the rest of his hospital stay. He was started on Zanaflex 2 mg q8H for his discomfort at drain site with improvement in pain. The morning of 9/28 one out of two bottles of blood culture was positive for strep species, which was thought to be a contaminate. He was started on Rocephin and monitored overnight. He remained afebrile and did not have elevated WBC. A second set of blood cultures were drawn for confirmation. He was discharged home on 9/29 with 4 more days of ciprofloxacin and flagyl, with close follow up with his PCP on 10/2 at the [facility]   Issues for Follow Up:  1. Follow up on pending blood cultures/speciation from possible contaminated bottle 2. Check hemoglobin, has history of anemia, hemoglobin was 8.4 on discharge 3. Pain control with Zanaflex  Significant Procedures: none  Significant Labs and Imaging:   Recent Labs Lab 05/26/16 0455 05/27/16 0823 05/28/16 0448  WBC 8.7 7.1 7.3  HGB  9.7* 9.3* 8.4*  HCT 31.6* 30.0* 27.1*  PLT 335 339 322    Recent Labs Lab 05/25/16 1711 05/26/16 0455  NA 134* 135  K 4.2 4.2  CL 99* 100*  CO2 26 24  GLUCOSE 115* 99  BUN 30* 24*  CREATININE 0.89 0.73  CALCIUM 9.7 9.9  ALKPHOS 227*  --   AST 42*  --   ALT 38  --   ALBUMIN 2.5*  --     Results/Tests Pending at Time of Discharge: two sets of blood cultures  Discharge Medications:    Medication List    TAKE these medications   acetaminophen 500 MG tablet Commonly known as:  TYLENOL Take 1,000 mg by mouth every 6 (six) hours as needed for fever.   albuterol 108 (90 Base) MCG/ACT inhaler Commonly known as:  PROVENTIL HFA;VENTOLIN HFA Inhale 2 puffs into the lungs every 6 (six) hours as needed for wheezing or shortness of breath.   aspirin 81 MG EC tablet Take 1 tablet (81 mg total) by mouth daily.   budesonide-formoterol 80-4.5 MCG/ACT inhaler Commonly known as:  SYMBICORT Inhale 2 puffs into the lungs 2 (two) times daily.   CALTRATE GUMMY BITES 250-400 MG-UNIT Chew Generic drug:  Ca Phosphate-Cholecalciferol Chew 2 tablets by mouth 2 (two) times daily.   carbamazepine 100  MG chewable tablet Commonly known as:  TEGRETOL Chew 300 mg by mouth 2 (two) times daily.   ciprofloxacin 750 MG tablet Commonly known as:  CIPRO Take 1 tablet (750 mg total) by mouth 2 (two) times daily.   docusate sodium 100 MG capsule Commonly known as:  COLACE Take 100 mg by mouth every evening.   feeding supplement (ENSURE ENLIVE) Liqd Take 237 mLs by mouth 2 (two) times daily between meals.   feeding supplement (PRO-STAT SUGAR FREE 64) Liqd Take 30 mLs by mouth 2 (two) times daily.   finasteride 5 MG tablet Commonly known as:  PROSCAR Take 5 mg by mouth every morning.   ipratropium 0.02 % nebulizer solution Commonly known as:  ATROVENT Take 2.5 mLs (0.5 mg total) by nebulization 2 (two) times daily as needed for wheezing or shortness of breath.   ipratropium 17 MCG/ACT  inhaler Commonly known as:  ATROVENT HFA Inhale 2 puffs into the lungs 2 (two) times daily as needed for wheezing.   isosorbide mononitrate 30 MG 24 hr tablet Commonly known as:  IMDUR Take 1 tablet (30 mg total) by mouth daily.   Melatonin 3 MG Tabs Take 3 mg by mouth at bedtime.   metoprolol succinate 12.5 mg Tb24 24 hr tablet Commonly known as:  TOPROL-XL Take 12.5 mg by mouth daily.   metroNIDAZOLE 500 MG tablet Commonly known as:  FLAGYL Take 1 tablet (500 mg total) by mouth every 6 (six) hours. Next dose at 6pm   multivitamin with minerals tablet Take 1 tablet by mouth every evening.   nitroGLYCERIN 0.4 MG SL tablet Commonly known as:  NITROSTAT Place 1 tablet (0.4 mg total) under the tongue every 5 (five) minutes as needed for chest pain (As needed for chest pain).   oxyCODONE 5 MG immediate release tablet Commonly known as:  ROXICODONE Take 1 tablet (5 mg total) by mouth every 6 (six) hours as needed for severe pain or breakthrough pain.   potassium chloride SA 20 MEQ tablet Commonly known as:  K-DUR,KLOR-CON Take 20 mEq by mouth daily.   ranitidine 150 MG tablet Commonly known as:  ZANTAC TAKE 1 TABLET BY MOUTH 2 TIMES DAILY. What changed:  See the new instructions.   senna 8.6 MG tablet Commonly known as:  SENOKOT Take 1 tablet by mouth daily.   sertraline 100 MG tablet Commonly known as:  ZOLOFT Take 150 mg by mouth every morning.   tamsulosin 0.4 MG Caps capsule Commonly known as:  FLOMAX Take 1 capsule (0.4 mg total) by mouth daily.   tiZANidine 2 MG tablet Commonly known as:  ZANAFLEX Take 1 tablet (2 mg total) by mouth every 8 (eight) hours as needed for muscle spasms.   traZODone 50 MG tablet Commonly known as:  DESYREL Take 1 tablet (50 mg total) by mouth at bedtime.       Discharge Instructions: Please refer to Patient Instructions section of EMR for full details.  Patient was counseled important signs and symptoms that should prompt  return to medical care, changes in medications, dietary instructions, activity restrictions, and follow up appointments.   Follow-Up Appointments: Follow-up Information    Shane Lope, DO. Go on 05/31/2016.   Why:  at [employer] information: [phone]           Steve Rattler, DO 05/28/2016, 11:53 AM PGY-1, [place]

## 2016-05-28 NOTE — Care Management Note (Signed)
Case Management Note  Patient Details  Name: [name] MRN: [id number] Date of Birth: [birth year]  Subjective/Objective:                    Action/Plan: Pt was active with [facility] prior to admission. Patient is discharging back home with his wife and resumption orders for [facility] PT/RN. Santiago Glad with [facility] notified of the d/c and the resumption of services.   Expected Discharge Date:                  Expected Discharge Plan:  Home/Self Care  In-House Referral:     Discharge planning Services  CM Consult  Post Acute Care Choice:  Home Health Choice offered to:  Patient  DME Arranged:    DME Agency:  [facility] Arranged:  PT, RN [facility] Agency:  [facility]  Status of Service:  Completed, signed off  If discussed at [place] of Stay Meetings, dates discussed:    Additional Comments:  Pollie Friar, RN 05/28/2016, 1:03 PM

## 2016-05-28 NOTE — Progress Notes (Signed)
Patient is being d/c home. Dc instructions given to patient and caregiver. They verbalized understanding. Patient awaiting transportation at this time.

## 2016-05-28 NOTE — Progress Notes (Signed)
Family Medicine Teaching Service Daily Progress Note Intern Pager: [phone]  Patient name: [name] Medical record number: [id number] Date of birth: 1925/05/14 Age: 82 y.o. Gender: male  Primary Care Provider: Bufford Lope, DO Consultants: IR Code Status: full  Pt Overview and Major Events to Date:  9/27 Admitted to [facility] for fevers 9/28 Positive blood culture 1 bottle strep species, possible contaminate  Assessment and Plan: [name] is a 82 y.o. male presenting with fever. PMH is significant for HTN, CAD s/p CABG 1986 with redo 1993, PTCA 2008, and DES 2010, hx MI, LBBB, HFrEF, Asthma/COPD, GERD, OA, Osteoporosis, Dyslipidemia, Peripheral Neuropathy, BPH with history or urinary retention, Pacemaker for tachy brady syndrome (03/2015), History of atrial flutter (no anticoagulation due to falls and anemia)   Fever: Tmax 100.8. Afebrile since admission. Lactic acid normal. WBC normal. Vital signs have been within normal limits. Drain site without significant erythema, no drainage around incision, bag filled with green-brown bile. CT abdomen was negative.  -monitor fever curve, has continued to be afebrile -CXR with right basilar atelectasis or pneumonia posteriorly and small right pleural effusion, favoring atelectasis as patient cannot take deep breaths, no cough or sputum, no white count, has been afebrile -blood cultures positive in 1 bottle for strep species, possible contaminate but started on Rocephin 2 g q12H -culture was re-incubated and has not speciated yet -repeat blood cultures pending -thought to be contaminate, patient is afebrile and does not have a white count  CBD Mass: CA 19-9 elevated to 91 at previous admission. Biopsy showed duodenal tubulovillous adenoma. Biliary drain in place. IR planning for stent internalization on 10/3.   -Zanaflex as needed for discomfort -tylenol and ibuprofen as needed for pain -per IR, no need to culture bile, keep date for stent  Oct. 3rd, can consider swapping tube if fevers continue but will hold off for now  CAD: CABG 1986 with redo 1993, PTCA 2008, and DES 2010. Review of phone records with PCP show that patient was instructed to resume Lipitor however this does not appear to have been added back to patient's medication list. LFTs previously elevated but have been trending downwards and essentially normal with AST 42 and ALT 38.  -continue ASA -consider restarting Lipitor as outpatient  Systolic CHF -held home lasix, patient appears dry on exam -daily weights, strict I/O's  HTN- SBPs 120-130's -continue imdur and metoprolol -continue to monitor  Anemia -Hgb this morning 8.4 -consider further work up  -monitor daily -threshold for transfusion 8  Depression: - continue Zoloft   Asthma/COPD: Reports intermittent use of home O2 at 2L by [place].  -continue Dulera (formulary substitute for Symbicort)  -albuterol and tiotroprium PRN  Peripheral Neuropathy: - continue Tegretol   BPH with history of urinary retention:  - cont finasteride and Flomax   FEN/GI: regular diet PPx: lovenox  Disposition: continue to monitor, home once stable  Subjective:  [name] is doing well today, he denies fevers or chills, feels his pain due to the drain is better with the muscle relaxant. Denies confusion. Is eager to go home today.  Objective: Temp:  [98.2 F (36.8 C)-99 F (37.2 C)] 98.3 F (36.8 C) (09/29 0441) Pulse Rate:  [68-81] 72 (09/29 0441) Resp:  [18-22] 20 (09/29 0441) BP: (107-154)/(47-60) 135/60 (09/29 0441) SpO2:  [94 %-98 %] 98 % (09/29 0441) Weight:  [153 lb 3.5 oz (69.5 kg)] 153 lb 3.5 oz (69.5 kg) (09/29 0441) Physical Exam: General: elderly thin man laying in bed  in no acute distress Cardiovascular: RRR no murmurs rubs or gallops Respiratory: CTA bilaterally no increased work of breathing Abdomen: soft, tender at drain site to palpation, non-distended, +BS Extremities: thin, no edema  or cyanosis  Laboratory:  Recent Labs Lab 05/26/16 0455 05/27/16 0823 05/28/16 0448  WBC 8.7 7.1 7.3  HGB 9.7* 9.3* 8.4*  HCT 31.6* 30.0* 27.1*  PLT 335 339 322    Recent Labs Lab 05/25/16 1711 05/26/16 0455  NA 134* 135  K 4.2 4.2  CL 99* 100*  CO2 26 24  BUN 30* 24*  CREATININE 0.89 0.73  CALCIUM 9.7 9.9  PROT 7.3  --   BILITOT 0.5  --   ALKPHOS 227*  --   ALT 38  --   AST 42*  --   GLUCOSE 115* 99    Imaging/Diagnostic Tests: No results found.  Steve Rattler, DO 05/28/2016, 9:26 AM PGY-1, [place] Intern pager: [phone], text pages welcome

## 2016-05-28 NOTE — Progress Notes (Signed)
[facility]  Patient Status: Active (receiving services up to time of hospitalization)  [facility] is providing the following services: RN and PT  If patient discharges after hours, please call [phone].   Edwinna Areola 05/28/2016, 11:13 AM

## 2016-05-28 NOTE — Discharge Instructions (Signed)
°  Please complete your course of antibiotics, Ciprofloxacin and Flagyl, for the next 4 days. The doctors who are doing the procedure on October 3rd will decide if you need further antibiotic treatment.   See your PCP on Monday, October 2nd, at [facility] can take Zanaflex 2mg  every 8 hours as needed for pain.   Blood Culture Test WHY AM I HAVING THIS TEST? A blood culture test is performed to see if you have an infection in your blood (septicemia). Septicemia could be caused by bacteria, fungi, or viruses. Normally, blood is free of bacteria, fungi, and viruses. This test may be ordered if you have symptoms of septicemia. These symptoms may include fever, chills, nausea, and fatigue. WHAT KIND OF SAMPLE IS TAKEN? At least two blood samples from two different veins are required for this test. The blood samples are usually collected by inserting a needle into a vein. This is done because:  There is a better chance of finding the infection with multiple samples.  Sometimes, despite disinfection of the skin where the blood is collected, you can grow a skin contaminant. This will result in a positive blood culture. This is called a false-positive. With multiple samples, there is a better chance of ruling out a false-positive. HOW DO I PREPARE FOR THE TEST? It is preferred to have the blood samples performed before starting antibiotic medicine. Tell your health care provider if you are currently taking an antibiotic. If blood cultures are performed while you are on an antibiotic, the blood samples should be performed shortly before you take a dose of antibiotic. HOW ARE YOUR TEST RESULTS REPORTED? Your test results will be reported as either positive or negative. It is your responsibility to obtain your test results. Ask the lab or department performing the test when and how you will get your results. A false-positive result can occur. A false-positive result is incorrect because it indicates a  condition or finding is present when it is not. A false-negative result can occur. A false-negative result is incorrect because it indicates a condition or finding is not present when it is. WHAT DO THE RESULTS MEAN? A positive blood test may mean that you have septicemia. Talk with your health care provider to discuss your results, treatment options, and if necessary, the need for more tests. Talk with your health care provider if you have any questions about your results.   This information is not intended to replace advice given to you by your health care provider. Make sure you discuss any questions you have with your health care provider.   Document Released: 09/08/2004 Document Revised: 09/06/2014 Document Reviewed: 01/21/2014 Elsevier Interactive Patient Education [employer].

## 2016-05-29 LAB — CULTURE, BLOOD (ROUTINE X 2)

## 2016-05-31 ENCOUNTER — Other Ambulatory Visit: Payer: Self-pay | Admitting: Radiology

## 2016-05-31 ENCOUNTER — Ambulatory Visit (INDEPENDENT_AMBULATORY_CARE_PROVIDER_SITE_OTHER): Payer: Medicare Other | Admitting: Family Medicine

## 2016-05-31 ENCOUNTER — Encounter: Payer: Self-pay | Admitting: Family Medicine

## 2016-05-31 VITALS — BP 125/52 | HR 79 | Temp 98.3°F | Ht 70.0 in

## 2016-05-31 DIAGNOSIS — J45909 Unspecified asthma, uncomplicated: Secondary | ICD-10-CM

## 2016-05-31 LAB — CULTURE, BLOOD (ROUTINE X 2): CULTURE: NO GROWTH

## 2016-05-31 MED ORDER — IPRATROPIUM-ALBUTEROL 0.5-2.5 (3) MG/3ML IN SOLN: 3.0000 mL | Freq: Four times a day (QID) | RESPIRATORY_TRACT | 2 refills | Status: AC | PRN

## 2016-05-31 MED ORDER — IPRATROPIUM-ALBUTEROL 0.5-2.5 (3) MG/3ML IN SOLN: 3.0000 mL | Freq: Once | RESPIRATORY_TRACT | Status: DC

## 2016-05-31 MED ORDER — IPRATROPIUM BROMIDE 0.02 % IN SOLN
0.5000 mg | Freq: Once | RESPIRATORY_TRACT | Status: AC
  Administered 2016-05-31: 0.5 mg via RESPIRATORY_TRACT

## 2016-05-31 MED ORDER — ALBUTEROL SULFATE (2.5 MG/3ML) 0.083% IN NEBU
2.5000 mg | INHALATION_SOLUTION | Freq: Once | RESPIRATORY_TRACT | Status: AC
  Administered 2016-05-31: 2.5 mg via RESPIRATORY_TRACT

## 2016-05-31 NOTE — Progress Notes (Deleted)
    Subjective:  [name] is a 82 y.o. male who presents to the [facility] today with a chief complaint of ***.   HPI:   ***HIST  Objective:  Physical Exam: BP (!) 125/52   Pulse 79   Temp 98.3 F (36.8 C) (Oral)   Ht 5\' 10"  (1.778 m)   SpO2 94% Comment: @3L   Gen: ***NAD, resting comfortably CV: RRR with no murmurs appreciated Pulm: NWOB, CTAB with no crackles, wheezes, or rhonchi GI: Normal bowel sounds present. Soft, Nontender, Nondistended. MSK: no edema, cyanosis, or clubbing noted Skin: warm, dry Neuro: grossly normal, moves all extremities Psych: Normal affect and thought content  No results found for this or any previous visit (from the past 72 hour(s)).   Assessment/Plan:  No problem-specific Assessment & Plan notes found for this encounter.   Bufford Lope, DO PGY-***, [place] [facility] 05/31/2016 4:40 PM

## 2016-05-31 NOTE — Patient Instructions (Signed)
Thank you for coming in to be seen today. We treated your shortness of breath with a nebulizer treatment in the clinic today and am sending you home with a home nebulizer. I have sent a prescription for duonebs nebulizer solution to your pharmacy.

## 2016-05-31 NOTE — Assessment & Plan Note (Addendum)
Given duoneb treatment in clinic today with symptomatic improvement. Given home nebulizer and prescribed duonebs. Given return precautions.

## 2016-05-31 NOTE — Progress Notes (Signed)
    Subjective:  [name] is a 82 y.o. male who presents to the [facility] today for a hospital follow up with a chief complaint of shortness of breath.  HPI: States has had SOB and fever since Friday after being discharged from the hospital. Took temp at home, Tmax was 101.0, took tylenol with some relief but continues to have chills. Has had some increased SOB and WOB over the same time frame. Has tried his home symbicort, atrovent and albuterol with some relief but feels chest is tight and "cannot catch my breath." Was instructed by home health RN to increase oxygen to 3L 24/7. Is asking for nebulizer treatment because had similar complaint during his recent hospital stay and felt nebulizer provided him with the most relief. Denies CP, abd pain, coughing.  Has procedure scheduled tomorrow to internalize biliary drain tomorrow. Will be getting blood work then.   ROS: Per HPI  Objective:  Physical Exam: BP (!) 125/52   Pulse 79   Temp 98.3 F (36.8 C) (Oral)   Ht 5\' 10"  (1.778 m)   SpO2 94% Comment: @3L   Gen: NAD, sitting up in wheelchair CV: RRR with no murmurs appreciated Pulm: Poor air movement b/l with crackles at R lower lung base, no accessory muscle use but appears visibly SOB with speech. Post nebulizer treatment lungs are CTAB without increased WOB.  Skin: warm, dry Neuro: grossly normal, moves all extremities Psych: Normal affect and thought content   Assessment/Plan:  Chronic asthma Given duoneb treatment in clinic today with symptomatic improvement. Given home nebulizer and prescribed duonebs. Given return precautions.   Bufford Lope, DO PGY-1, [facility] 05/31/2016 4:01 PM

## 2016-06-01 ENCOUNTER — Ambulatory Visit (HOSPITAL_COMMUNITY)
Admission: RE | Admit: 2016-06-01 | Discharge: 2016-06-01 | Disposition: A | Discharge status: Home / Self Care | Payer: Medicare Other | Source: Ambulatory Visit | Attending: Interventional Radiology | Admitting: Interventional Radiology

## 2016-06-01 ENCOUNTER — Encounter (HOSPITAL_COMMUNITY): Payer: Self-pay

## 2016-06-01 DIAGNOSIS — F329 Major depressive disorder, single episode, unspecified: Secondary | ICD-10-CM | POA: Insufficient documentation

## 2016-06-01 DIAGNOSIS — J45909 Unspecified asthma, uncomplicated: Secondary | ICD-10-CM | POA: Insufficient documentation

## 2016-06-01 DIAGNOSIS — Z955 Presence of coronary angioplasty implant and graft: Secondary | ICD-10-CM

## 2016-06-01 DIAGNOSIS — K219 Gastro-esophageal reflux disease without esophagitis: Secondary | ICD-10-CM

## 2016-06-01 DIAGNOSIS — Z4803 Encounter for change or removal of drains: Secondary | ICD-10-CM

## 2016-06-01 DIAGNOSIS — I739 Peripheral vascular disease, unspecified: Secondary | ICD-10-CM | POA: Insufficient documentation

## 2016-06-01 DIAGNOSIS — I4892 Unspecified atrial flutter: Secondary | ICD-10-CM

## 2016-06-01 DIAGNOSIS — J189 Pneumonia, unspecified organism: Secondary | ICD-10-CM | POA: Diagnosis not present

## 2016-06-01 DIAGNOSIS — K831 Obstruction of bile duct: Secondary | ICD-10-CM

## 2016-06-01 DIAGNOSIS — I5020 Unspecified systolic (congestive) heart failure: Secondary | ICD-10-CM | POA: Insufficient documentation

## 2016-06-01 DIAGNOSIS — M1990 Unspecified osteoarthritis, unspecified site: Secondary | ICD-10-CM

## 2016-06-01 DIAGNOSIS — Z8249 Family history of ischemic heart disease and other diseases of the circulatory system: Secondary | ICD-10-CM | POA: Insufficient documentation

## 2016-06-01 DIAGNOSIS — G629 Polyneuropathy, unspecified: Secondary | ICD-10-CM | POA: Insufficient documentation

## 2016-06-01 DIAGNOSIS — Z9981 Dependence on supplemental oxygen: Secondary | ICD-10-CM

## 2016-06-01 DIAGNOSIS — E785 Hyperlipidemia, unspecified: Secondary | ICD-10-CM | POA: Insufficient documentation

## 2016-06-01 DIAGNOSIS — Z951 Presence of aortocoronary bypass graft: Secondary | ICD-10-CM | POA: Insufficient documentation

## 2016-06-01 DIAGNOSIS — K3189 Other diseases of stomach and duodenum: Secondary | ICD-10-CM

## 2016-06-01 DIAGNOSIS — K449 Diaphragmatic hernia without obstruction or gangrene: Secondary | ICD-10-CM

## 2016-06-01 DIAGNOSIS — R0602 Shortness of breath: Secondary | ICD-10-CM | POA: Diagnosis not present

## 2016-06-01 DIAGNOSIS — I252 Old myocardial infarction: Secondary | ICD-10-CM

## 2016-06-01 DIAGNOSIS — Z8546 Personal history of malignant neoplasm of prostate: Secondary | ICD-10-CM

## 2016-06-01 DIAGNOSIS — I110 Hypertensive heart disease with heart failure: Secondary | ICD-10-CM | POA: Insufficient documentation

## 2016-06-01 DIAGNOSIS — Z950 Presence of cardiac pacemaker: Secondary | ICD-10-CM

## 2016-06-01 DIAGNOSIS — I2510 Atherosclerotic heart disease of native coronary artery without angina pectoris: Secondary | ICD-10-CM | POA: Insufficient documentation

## 2016-06-01 HISTORY — PX: IR GENERIC HISTORICAL: IMG1180011

## 2016-06-01 LAB — COMPREHENSIVE METABOLIC PANEL
ALT: 38 U/L (ref 17–63)
AST: 52 U/L — AB (ref 15–41)
Albumin: 2.1 g/dL — ABNORMAL LOW (ref 3.5–5.0)
Alkaline Phosphatase: 261 U/L — ABNORMAL HIGH (ref 38–126)
Anion gap: 6 (ref 5–15)
BUN: 19 mg/dL (ref 6–20)
CHLORIDE: 102 mmol/L (ref 101–111)
CO2: 26 mmol/L (ref 22–32)
CREATININE: 0.82 mg/dL (ref 0.61–1.24)
Calcium: 9.4 mg/dL (ref 8.9–10.3)
GFR calc Af Amer: >60 mL/min (ref >60)
GFR calc non Af Amer: >60 mL/min (ref >60)
Glucose, Bld: 115 mg/dL — ABNORMAL HIGH (ref 65–99)
POTASSIUM: 4.1 mmol/L (ref 3.5–5.1)
SODIUM: 134 mmol/L — AB (ref 135–145)
Total Bilirubin: 0.5 mg/dL (ref 0.3–1.2)
Total Protein: 6.9 g/dL (ref 6.5–8.1)

## 2016-06-01 LAB — CBC WITH DIFFERENTIAL/PLATELET
BASOS ABS: 0 10*3/uL (ref 0.0–0.1)
BASOS PCT: 0 %
EOS ABS: 0 10*3/uL (ref 0.0–0.7)
EOS PCT: 0 %
HCT: 28.5 % — ABNORMAL LOW (ref 39.0–52.0)
Hemoglobin: 8.9 g/dL — ABNORMAL LOW (ref 13.0–17.0)
LYMPHS PCT: 16 %
Lymphs Abs: 1.7 10*3/uL (ref 0.7–4.0)
MCH: 28.4 pg (ref 26.0–34.0)
MCHC: 31.2 g/dL (ref 30.0–36.0)
MCV: 91.1 fL (ref 78.0–100.0)
Monocytes Absolute: 0.8 10*3/uL (ref 0.1–1.0)
Monocytes Relative: 8 %
Neutro Abs: 8.4 10*3/uL — ABNORMAL HIGH (ref 1.7–7.7)
Neutrophils Relative %: 76 %
PLATELETS: 532 10*3/uL — AB (ref 150–400)
RBC: 3.13 MIL/uL — AB (ref 4.22–5.81)
RDW: 15 % (ref 11.5–15.5)
WBC: 11 10*3/uL — AB (ref 4.0–10.5)

## 2016-06-01 LAB — PROTIME-INR
INR: 1.46
Prothrombin Time: 17.9 seconds — ABNORMAL HIGH (ref 11.4–15.2)

## 2016-06-01 LAB — APTT: APTT: 36 s (ref 24–36)

## 2016-06-01 MED ORDER — LIDOCAINE HCL 1 % IJ SOLN
INTRAMUSCULAR | Status: AC | PRN
  Administered 2016-06-01: 5 mL

## 2016-06-01 MED ORDER — PIPERACILLIN-TAZOBACTAM 3.375 G IVPB
3.3750 g | INTRAVENOUS | Status: AC
  Administered 2016-06-01: 3.375 g via INTRAVENOUS
  Filled 2016-06-01: qty 50

## 2016-06-01 MED ORDER — MIDAZOLAM HCL 2 MG/2ML IJ SOLN
INTRAMUSCULAR | Status: AC | PRN
  Administered 2016-06-01: 0.5 mg via INTRAVENOUS

## 2016-06-01 MED ORDER — FENTANYL CITRATE (PF) 100 MCG/2ML IJ SOLN
INTRAMUSCULAR | Status: AC
  Filled 2016-06-01: qty 2

## 2016-06-01 MED ORDER — IOPAMIDOL (ISOVUE-300) INJECTION 61%
INTRAVENOUS | Status: AC | PRN
  Administered 2016-06-01: 47 mL

## 2016-06-01 MED ORDER — FENTANYL CITRATE (PF) 100 MCG/2ML IJ SOLN
INTRAMUSCULAR | Status: AC | PRN
  Administered 2016-06-01: 50 ug via INTRAVENOUS

## 2016-06-01 MED ORDER — SODIUM CHLORIDE 0.9 % IV SOLN: INTRAVENOUS | Status: DC

## 2016-06-01 MED ORDER — IOPAMIDOL (ISOVUE-300) INJECTION 61%
INTRAVENOUS | Status: AC
  Filled 2016-06-01: qty 50

## 2016-06-01 MED ORDER — MIDAZOLAM HCL 2 MG/2ML IJ SOLN
INTRAMUSCULAR | Status: AC
  Filled 2016-06-01: qty 2

## 2016-06-01 MED ORDER — LIDOCAINE HCL 1 % IJ SOLN
INTRAMUSCULAR | Status: AC
  Filled 2016-06-01: qty 20

## 2016-06-01 NOTE — Sedation Documentation (Signed)
Patient is resting comfortably. 

## 2016-06-01 NOTE — Discharge Instructions (Signed)
Biliary Drainage Catheter Placement, Care After Refer to this sheet in the next few weeks. These instructions provide you with information on caring for yourself after your procedure. Your health care provider may also give you more specific instructions. Your treatment has been planned according to current medical practices, but problems sometimes occur. Call your health care provider if you have any problems or questions after your procedure. WHAT TO EXPECT AFTER THE PROCEDURE After your procedure, it is typical to have the following:  Pain or soreness at the insertion site.  Drowsiness for several hours after the procedure.  Some bruising at the  insertion site.  Drainage into the collection bag on the outside of your body, if you have an external drainage catheter. You might see bloody discharge in the bag for the first day or two. This should turn a yellow-green color soon afterward. HOME CARE INSTRUCTIONS   Do not use machinery, drive, or make legal decisions for 24 hours after your procedure.  Have someone drive you home.  Resume your usual diet. Avoid alcoholic beverages for 24 hours after your procedure.  Rest for the remainder of the day.  Only take over-the-counter or prescription medicines for pain, discomfort, or fever as directed by your health care provider. Do not take aspirin or blood thinners unless directed otherwise. This can make bleeding worse.  Clean the tube insertion site as directed by your health care provider.  Take showers, not baths. Avoid pools and hot tubs. Before showering, cover the area with plastic wrap and tape the edges of the plastic wrap to your skin. This is done to keep your skin dry.  Keep the skin around the insertion site dry. If the area gets wet, dry the skin completely.  Keep all follow-up appointments. SEEK MEDICAL CARE IF:   Your pain gets worse and is not relieved with pain medicines after an initial improvement.  You have any  questions about your tube.  Your skin breaks down around the tube.  You have a fever.  You have chills. SEEK IMMEDIATE MEDICAL CARE IF:   Your redness, soreness, or swelling at the  insertion site gets worse despite good cleaning.     This information is not intended to replace advice given to you by your health care provider. Make sure you discuss any questions you have with your health care provider.   Document Released: 03/30/2004 Document Revised: 08/21/2013 Document Reviewed: 04/23/2013 Elsevier Interactive Patient Education [employer].

## 2016-06-01 NOTE — H&P (Signed)
Chief Complaint: biliary obstruction secondary to peri-ampullary mass  Referring Physician:Dr. Owens Loffler  Supervising Physician: Corrie Mckusick  Patient Status: Out-pt  HPI: [name] is an 82 y.o. male who has a tubulovillous adenoma of the duodenum that is causing a biliary obstruction.  He is not a surgical candidate due to his age and comorbidities.  He underwent an I/E biliary drain placement on 05-01-16.  A cholangiogram on 9-21 continue to confirm a CBD obstruction and a wall flex stent was recommended.  He presents today for this procedure.  He is having worsening SOB since his discharge last week.  He is ok 3L of [place] O2 at home continuously.  He complaints of abdominal pain, nausea, and feeling weak.  He presents today for the above noted procedure.  Past Medical History:  Past Medical History:  Diagnosis Date  . Anemia 2014   was on iron pills and then was able to come off  . Arthritis    "all over"  . Asthma       . Atrial flutter ([place])    a. diagnosed 03/2015  . Bacteremia 03/2016  . Complication of anesthesia    B/P dropped low and had to stay in recovery longer  . Coronary artery disease    a. s/p CABG 1986 b. redo CABG 1993 c. PTCA 2008 d. DES 2010  . Depression       . Diverticulosis   . GERD (gastroesophageal reflux disease)       . Hepatitis    hepatitis- long time ago, occupational contamination   . Hiatal hernia   . Hyperlipemia   . Hypertension       . Insomnia       . Kidney stones   . Myocardial infarction   . On home oxygen therapy    "2L; just at night" (01/31/2015)  . Peripheral neuropathy ([place])   . Peripheral vascular disease (HCC)    thrombus- in leg- many yrs. ago- ?R- coumadin x1 mth  . Pneumonia 1960's  . Presence of permanent cardiac pacemaker   . Prostate cancer [name])    radiation therapy- 2002  . Systolic CHF ([place])       . VT (ventricular tachycardia) ([place]) 06/2012   a. in the setting of STEMI b. on amio during  hospitalization    Past Surgical History:  Past Surgical History:  Procedure Laterality Date  . APPENDECTOMY  1943  . BILIARY DRAINAGE  05/14/2016  . CATARACT EXTRACTION W/ INTRAOCULAR LENS  IMPLANT, BILATERAL    . COLONOSCOPY    . CORONARY ARTERY BYPASS GRAFT  1986;  1993   CABG X 5; CABG X 1  . CYSTOSCOPY W/ URETERAL STENT PLACEMENT Right 02/28/2013   Procedure: CYSTOSCOPY WITH RETROGRADE PYELOGRAM/URETERAL STENT PLACEMENT;  Surgeon: Dutch Gray, MD;  Location: [facility];  Service: Urology;  Laterality: Right;  . CYSTOSCOPY WITH URETEROSCOPY AND STENT PLACEMENT Right 03/23/2013   Procedure: RIGHT URETEROSCOPY, LASER LITHO AND STENT PLACEMENT;  Surgeon: Fredricka Bonine, MD;  Location: [facility];  Service: Urology;  Laterality: Right;  . ENDOSCOPIC RETROGRADE CHOLANGIOPANCREATOGRAPHY (ERCP) WITH PROPOFOL  04/30/2016  . EP IMPLANTABLE DEVICE N/A 04/11/2015   Procedure: Pacemaker Implant;  Surgeon: Will Meredith Leeds, MD;  Location: [facility] CV LAB;  Service: Cardiovascular;  Laterality: N/A;  . ERCP N/A 04/30/2016   Procedure: ENDOSCOPIC RETROGRADE CHOLANGIOPANCREATOGRAPHY (ERCP);  Surgeon: Milus Banister, MD;  Location: [facility];  Service: Endoscopy;  Laterality: N/A;  . FOOT  FUSION Left 2000   heel  . FRACTURE SURGERY     heel- crushed -2002,  (hardware)   . HAMMER TOE SURGERY Right    little toe  . HARDWARE REMOVAL Right 2002   great toe; Dr. Sharol Given  . HOLMIUM LASER APPLICATION Right 2/33/4356   Procedure: HOLMIUM LASER APPLICATION;  Surgeon: Fredricka Bonine, MD;  Location: [facility];  Service: Urology;  Laterality: Right;  . INGUINAL HERNIA REPAIR Left 01/31/2015   Procedure: OPEN REPAIR RECURRENT LEFT INGUINAL HERNIA;  Surgeon: Fanny Skates, MD;  Location: [facility];  Service: General;  Laterality: Left;  . INSERTION OF MESH Left 01/31/2015   Procedure: INSERTION OF MESH;  Surgeon: Fanny Skates, MD;  Location: [facility];  Service: General;  Laterality: Left;  . INTRAMEDULLARY  (IM) NAIL INTERTROCHANTERIC Right 08/29/2013   Procedure: INTRAMEDULLARY (IM) NAIL INTERTROCHANTRIC;  Surgeon: Marianna Payment, MD;  Location: [facility];  Service: Orthopedics;  Laterality: Right;  . IR GENERIC HISTORICAL  05/01/2016   IR INT EXT BILIARY DRAIN WITH CHOLANGIOGRAM 05/01/2016 Corrie Mckusick, DO [facility]  . JOINT REPLACEMENT     R great toe  . KNEE ARTHROSCOPY Right X 2  . LEFT AND RIGHT HEART CATHETERIZATION WITH CORONARY ANGIOGRAM N/A 07/15/2012   Procedure: LEFT AND RIGHT HEART CATHETERIZATION WITH CORONARY ANGIOGRAM;  Surgeon: Jettie Booze, MD;  Location: [facility] C[facility];  Service: Cardiovascular;  Laterality: N/A;  . LUMBAR LAMINECTOMY/DECOMPRESSION MICRODISCECTOMY  08/10/2011   Procedure: LUMBAR LAMINECTOMY/DECOMPRESSION MICRODISCECTOMY;  Surgeon: Cooper Render Pool;  Location: [place] [facility] NEURO ORS;  Service: Neurosurgery;  Laterality: Right;  RIGHT Lumbar five-sacral one LAMINECTOMY, MICRODISCECTOMY  . LUMBAR WOUND DEBRIDEMENT  08/22/2011   Procedure: LUMBAR WOUND DEBRIDEMENT;  Surgeon: Eustace Moore;  Location: [place] NEURO ORS;  Service: Neurosurgery;  Laterality: N/A;  Repair of CSF Leak requiring laminectomy  . PERCUTANEOUS CORONARY STENT INTERVENTION (PCI-S) N/A 07/15/2012   Procedure: PERCUTANEOUS CORONARY STENT INTERVENTION (PCI-S);  Surgeon: Jettie Booze, MD;  Location: [facility] CATH LAB;  Service: Cardiovascular;  Laterality: N/A;  . RHINOPLASTY  [id number]  . TEE WITHOUT CARDIOVERSION N/A 04/28/2016   Procedure: TRANSESOPHAGEAL ECHOCARDIOGRAM (TEE);  Surgeon: Skeet Latch, MD;  Location: [facility] ENDOSCOPY;  Service: Cardiovascular;  Laterality: N/A;  . TONSILLECTOMY  1940  . TRANSURETHRAL RESECTION OF PROSTATE  x2    Family History:  Family History  Problem Relation Age of Onset  . Heart attack Mother   . Addison's disease Father   . Heart attack Sister   . Anesthesia problems Neg Hx   . Hypotension Neg Hx   . Malignant hyperthermia Neg Hx   . Pseudochol deficiency Neg Hx       Social History:  reports that he has never smoked. He has never used smokeless tobacco. He reports that he does not drink alcohol or use drugs.  Allergies: No Known Allergies  Medications: Medications reviewed in Epic  Please HPI for pertinent positives, otherwise complete 10 system ROS negative.  Mallampati Score: MD Evaluation Airway: WNL Heart: WNL Abdomen: WNL Chest/ Lungs: WNL ASA  Classification: 3 Mallampati/Airway Score: Two  Physical Exam: BP (!) 147/61   Pulse 81   Temp 98.9 F (37.2 C) (Oral)   Ht 5' 10"  (1.778 m)   Wt 154 lb (69.9 kg)   SpO2 98%   BMI 22.10 kg/m  Body mass index is 22.1 kg/m. General: pleasant, elderly white male who is laying in bed in NAD HEENT: head is normocephalic, atraumatic.  Sclera are noninjected.  PERRL.  Ears and nose without any masses or lesions.  Mouth is pink and moist Heart: regular, rate, and rhythm.  Normal s1,s2. +murmur. No obvious gallops, or rubs noted.  Palpable radial pulses bilaterally Lungs: few scattered wheezes noted. no rhonchi or rales noted.  Respiratory effort nonlabored, but does have [place] in place with 3L O2. Abd: soft, tender in RUQ and on the right side of his abdomen, ND, +BS, no masses, hernias, or organomegaly.  His biliary drain is in place with minimal biliary output currently.  Site is c/d/i Psych: A&Ox3 with an appropriate affect.   Labs: Results for orders placed or performed during the hospital encounter of 06/01/16 (from the past 48 hour(s))  APTT     Status: None   Collection Time: 06/01/16  8:23 AM  Result Value Ref Range   aPTT 36 24 - 36 seconds  CBC with Differential/Platelet     Status: Abnormal   Collection Time: 06/01/16  8:23 AM  Result Value Ref Range   WBC 11.0 (H) 4.0 - 10.5 K/uL   RBC 3.13 (L) 4.22 - 5.81 MIL/uL   Hemoglobin 8.9 (L) 13.0 - 17.0 g/dL   HCT 28.5 (L) 39.0 - 52.0 %   MCV 91.1 78.0 - 100.0 fL   MCH 28.4 26.0 - 34.0 pg   MCHC 31.2 30.0 - 36.0 g/dL   RDW 15.0 11.5  - 15.5 %   Platelets 532 (H) 150 - 400 K/uL   Neutrophils Relative % 76 %   Neutro Abs 8.4 (H) 1.7 - 7.7 K/uL   Lymphocytes Relative 16 %   Lymphs Abs 1.7 0.7 - 4.0 K/uL   Monocytes Relative 8 %   Monocytes Absolute 0.8 0.1 - 1.0 K/uL   Eosinophils Relative 0 %   Eosinophils Absolute 0.0 0.0 - 0.7 K/uL   Basophils Relative 0 %   Basophils Absolute 0.0 0.0 - 0.1 K/uL  Comprehensive metabolic panel     Status: Abnormal   Collection Time: 06/01/16  8:23 AM  Result Value Ref Range   Sodium 134 (L) 135 - 145 mmol/L   Potassium 4.1 3.5 - 5.1 mmol/L   Chloride 102 101 - 111 mmol/L   CO2 26 22 - 32 mmol/L   Glucose, Bld 115 (H) 65 - 99 mg/dL   BUN 19 6 - 20 mg/dL   Creatinine, Ser 0.82 0.61 - 1.24 mg/dL   Calcium 9.4 8.9 - 10.3 mg/dL   Total Protein 6.9 6.5 - 8.1 g/dL   Albumin 2.1 (L) 3.5 - 5.0 g/dL   AST 52 (H) 15 - 41 U/L   ALT 38 17 - 63 U/L   Alkaline Phosphatase 261 (H) 38 - 126 U/L   Total Bilirubin 0.5 0.3 - 1.2 mg/dL   GFR calc non Af Amer >60 >60 mL/min   GFR calc Af Amer >60 >60 mL/min    Comment: (NOTE) The eGFR has been calculated using the CKD EPI equation. This calculation has not been validated in all clinical situations. eGFR's persistently <60 mL/min signify possible Chronic Kidney Disease.    Anion gap 6 5 - 15  Protime-INR     Status: Abnormal   Collection Time: 06/01/16  8:23 AM  Result Value Ref Range   Prothrombin Time 17.9 (H) 11.4 - 15.2 seconds   INR 1.46     Imaging: No results found.  Assessment/Plan 1. 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  Recently cholangiogram still confirms  a CBD obstruction.  We will proceed today with a wall flex stent.  If this is able to be placed and there is no further concern about blockage, then we may remove his drain.  If there is still possible concern for obstruction, then we may just exchange his drain and plan for interval drain  removal. -labs and vitals reviewed -Risks and Benefits discussed with the patient including bleeding, infection, damage to adjacent structures, and sepsis. All of the patient's questions were answered, patient is agreeable to proceed. Consent signed and in chart.  Thank you for this interesting consult.  I greatly enjoyed meeting Prajwal A Mallicoat and look forward to participating in their care.  A copy of this report was sent to the requesting provider on this date.  Electronically Signed: Henreitta Cea 06/01/2016, 9:38 AM   I spent a total of    25 Minutes in face to face in clinical consultation, greater than 50% of which was counseling/coordinating care for biliary obstruction

## 2016-06-01 NOTE — Procedures (Signed)
Interventional Radiology Procedure Note  Procedure: Through the tube cholangiogram, with placement of open metal stent across the ampulla to treat biliary obstruction secondary to tubulovillous adenoma.    12 x 60 SMART Control Stent placed.   Complications: None Recommendations:  - Ok to shower tomorrow - 1 hour recovery - Do not submerge wound for 7 days - Routine care   Signed,  Dulcy Fanny. Earleen Newport, DO

## 2016-06-02 ENCOUNTER — Encounter: Payer: Self-pay | Admitting: Gastroenterology

## 2016-06-02 ENCOUNTER — Encounter (HOSPITAL_COMMUNITY): Payer: Self-pay | Admitting: *Deleted

## 2016-06-02 ENCOUNTER — Other Ambulatory Visit: Payer: Self-pay

## 2016-06-02 ENCOUNTER — Inpatient Hospital Stay (HOSPITAL_COMMUNITY)
Admission: EM | Admit: 2016-06-02 | Discharge: 2016-06-08 | DRG: 193 | Disposition: A | Discharge status: Home Health | Payer: Medicare Other | Attending: Family Medicine | Admitting: Family Medicine

## 2016-06-02 ENCOUNTER — Ambulatory Visit (INDEPENDENT_AMBULATORY_CARE_PROVIDER_SITE_OTHER): Payer: Medicare Other | Admitting: Gastroenterology

## 2016-06-02 ENCOUNTER — Emergency Department (HOSPITAL_COMMUNITY): Payer: Medicare Other

## 2016-06-02 ENCOUNTER — Ambulatory Visit (INDEPENDENT_AMBULATORY_CARE_PROVIDER_SITE_OTHER): Payer: Medicare Other | Admitting: Family Medicine

## 2016-06-02 ENCOUNTER — Encounter: Payer: Self-pay | Admitting: Family Medicine

## 2016-06-02 VITALS — BP 140/59 | HR 71 | Temp 97.8°F | Ht 71.0 in | Wt 157.8 lb

## 2016-06-02 VITALS — BP 122/60 | HR 76 | Temp 98.8°F | Ht 71.0 in | Wt 159.0 lb

## 2016-06-02 DIAGNOSIS — J189 Pneumonia, unspecified organism: Secondary | ICD-10-CM | POA: Diagnosis present

## 2016-06-02 DIAGNOSIS — I2583 Coronary atherosclerosis due to lipid rich plaque: Secondary | ICD-10-CM | POA: Diagnosis present

## 2016-06-02 DIAGNOSIS — D132 Benign neoplasm of duodenum: Secondary | ICD-10-CM | POA: Diagnosis present

## 2016-06-02 DIAGNOSIS — R05 Cough: Secondary | ICD-10-CM | POA: Insufficient documentation

## 2016-06-02 DIAGNOSIS — N400 Enlarged prostate without lower urinary tract symptoms: Secondary | ICD-10-CM | POA: Diagnosis present

## 2016-06-02 DIAGNOSIS — K831 Obstruction of bile duct: Secondary | ICD-10-CM | POA: Diagnosis present

## 2016-06-02 DIAGNOSIS — J449 Chronic obstructive pulmonary disease, unspecified: Secondary | ICD-10-CM | POA: Diagnosis present

## 2016-06-02 DIAGNOSIS — I5022 Chronic systolic (congestive) heart failure: Secondary | ICD-10-CM | POA: Diagnosis present

## 2016-06-02 DIAGNOSIS — Z7951 Long term (current) use of inhaled steroids: Secondary | ICD-10-CM

## 2016-06-02 DIAGNOSIS — R0902 Hypoxemia: Secondary | ICD-10-CM | POA: Diagnosis not present

## 2016-06-02 DIAGNOSIS — R5081 Fever presenting with conditions classified elsewhere: Secondary | ICD-10-CM | POA: Diagnosis not present

## 2016-06-02 DIAGNOSIS — J9621 Acute and chronic respiratory failure with hypoxia: Secondary | ICD-10-CM

## 2016-06-02 DIAGNOSIS — E785 Hyperlipidemia, unspecified: Secondary | ICD-10-CM | POA: Diagnosis present

## 2016-06-02 DIAGNOSIS — Z79899 Other long term (current) drug therapy: Secondary | ICD-10-CM

## 2016-06-02 DIAGNOSIS — Z8546 Personal history of malignant neoplasm of prostate: Secondary | ICD-10-CM | POA: Diagnosis not present

## 2016-06-02 DIAGNOSIS — Z950 Presence of cardiac pacemaker: Secondary | ICD-10-CM | POA: Diagnosis not present

## 2016-06-02 DIAGNOSIS — I4892 Unspecified atrial flutter: Secondary | ICD-10-CM | POA: Diagnosis present

## 2016-06-02 DIAGNOSIS — R0600 Dyspnea, unspecified: Secondary | ICD-10-CM

## 2016-06-02 DIAGNOSIS — D649 Anemia, unspecified: Secondary | ICD-10-CM | POA: Diagnosis present

## 2016-06-02 DIAGNOSIS — J90 Pleural effusion, not elsewhere classified: Secondary | ICD-10-CM | POA: Diagnosis present

## 2016-06-02 DIAGNOSIS — D490 Neoplasm of unspecified behavior of digestive system: Secondary | ICD-10-CM

## 2016-06-02 DIAGNOSIS — Z951 Presence of aortocoronary bypass graft: Secondary | ICD-10-CM | POA: Diagnosis not present

## 2016-06-02 DIAGNOSIS — K219 Gastro-esophageal reflux disease without esophagitis: Secondary | ICD-10-CM | POA: Diagnosis present

## 2016-06-02 DIAGNOSIS — G4700 Insomnia, unspecified: Secondary | ICD-10-CM | POA: Diagnosis present

## 2016-06-02 DIAGNOSIS — J4520 Mild intermittent asthma, uncomplicated: Secondary | ICD-10-CM | POA: Diagnosis present

## 2016-06-02 DIAGNOSIS — I2510 Atherosclerotic heart disease of native coronary artery without angina pectoris: Secondary | ICD-10-CM | POA: Diagnosis present

## 2016-06-02 DIAGNOSIS — F419 Anxiety disorder, unspecified: Secondary | ICD-10-CM | POA: Diagnosis present

## 2016-06-02 DIAGNOSIS — I110 Hypertensive heart disease with heart failure: Secondary | ICD-10-CM | POA: Diagnosis present

## 2016-06-02 DIAGNOSIS — T85520A Displacement of bile duct prosthesis, initial encounter: Secondary | ICD-10-CM | POA: Diagnosis present

## 2016-06-02 DIAGNOSIS — J440 Chronic obstructive pulmonary disease with acute lower respiratory infection: Secondary | ICD-10-CM | POA: Diagnosis present

## 2016-06-02 DIAGNOSIS — J9811 Atelectasis: Secondary | ICD-10-CM | POA: Diagnosis not present

## 2016-06-02 DIAGNOSIS — R059 Cough, unspecified: Secondary | ICD-10-CM

## 2016-06-02 DIAGNOSIS — F329 Major depressive disorder, single episode, unspecified: Secondary | ICD-10-CM | POA: Diagnosis present

## 2016-06-02 DIAGNOSIS — Z7982 Long term (current) use of aspirin: Secondary | ICD-10-CM

## 2016-06-02 DIAGNOSIS — R0602 Shortness of breath: Secondary | ICD-10-CM

## 2016-06-02 DIAGNOSIS — Z8249 Family history of ischemic heart disease and other diseases of the circulatory system: Secondary | ICD-10-CM

## 2016-06-02 DIAGNOSIS — I252 Old myocardial infarction: Secondary | ICD-10-CM | POA: Diagnosis not present

## 2016-06-02 DIAGNOSIS — J80 Acute respiratory distress syndrome: Secondary | ICD-10-CM | POA: Diagnosis not present

## 2016-06-02 DIAGNOSIS — I447 Left bundle-branch block, unspecified: Secondary | ICD-10-CM | POA: Diagnosis present

## 2016-06-02 DIAGNOSIS — I5020 Unspecified systolic (congestive) heart failure: Secondary | ICD-10-CM | POA: Diagnosis present

## 2016-06-02 DIAGNOSIS — F32A Depression, unspecified: Secondary | ICD-10-CM | POA: Diagnosis present

## 2016-06-02 DIAGNOSIS — Z955 Presence of coronary angioplasty implant and graft: Secondary | ICD-10-CM | POA: Diagnosis not present

## 2016-06-02 LAB — COMPREHENSIVE METABOLIC PANEL
ALBUMIN: 2.2 g/dL — AB (ref 3.5–5.0)
ALK PHOS: 279 U/L — AB (ref 38–126)
ALT: 40 U/L (ref 17–63)
ANION GAP: 7 (ref 5–15)
AST: 56 U/L — AB (ref 15–41)
BUN: 19 mg/dL (ref 6–20)
CALCIUM: 9.6 mg/dL (ref 8.9–10.3)
CO2: 25 mmol/L (ref 22–32)
Chloride: 104 mmol/L (ref 101–111)
Creatinine, Ser: 0.79 mg/dL (ref 0.61–1.24)
GFR calc Af Amer: >60 mL/min (ref >60)
GFR calc non Af Amer: >60 mL/min (ref >60)
GLUCOSE: 116 mg/dL — AB (ref 65–99)
Potassium: 4.2 mmol/L (ref 3.5–5.1)
SODIUM: 136 mmol/L (ref 135–145)
Total Bilirubin: 0.4 mg/dL (ref 0.3–1.2)
Total Protein: 7.1 g/dL (ref 6.5–8.1)

## 2016-06-02 LAB — CULTURE, BLOOD (ROUTINE X 2)
CULTURE: NO GROWTH
CULTURE: NO GROWTH

## 2016-06-02 LAB — CBC
HEMATOCRIT: 29.8 % — AB (ref 39.0–52.0)
HEMOGLOBIN: 9.3 g/dL — AB (ref 13.0–17.0)
MCH: 28.6 pg (ref 26.0–34.0)
MCHC: 31.2 g/dL (ref 30.0–36.0)
MCV: 91.7 fL (ref 78.0–100.0)
Platelets: 598 10*3/uL — ABNORMAL HIGH (ref 150–400)
RBC: 3.25 MIL/uL — ABNORMAL LOW (ref 4.22–5.81)
RDW: 15 % (ref 11.5–15.5)
WBC: 11.5 10*3/uL — ABNORMAL HIGH (ref 4.0–10.5)

## 2016-06-02 LAB — URINE MICROSCOPIC-ADD ON

## 2016-06-02 LAB — URINALYSIS, ROUTINE W REFLEX MICROSCOPIC
BILIRUBIN URINE: NEGATIVE
Glucose, UA: NEGATIVE mg/dL
HGB URINE DIPSTICK: NEGATIVE
Ketones, ur: NEGATIVE mg/dL
Nitrite: NEGATIVE
PH: 6 (ref 5.0–8.0)
Protein, ur: NEGATIVE mg/dL
SPECIFIC GRAVITY, URINE: 1.034 — AB (ref 1.005–1.030)

## 2016-06-02 LAB — I-STAT CG4 LACTIC ACID, ED
Lactic Acid, Venous: 1.16 mmol/L (ref 0.5–1.9)
Lactic Acid, Venous: 1.23 mmol/L (ref 0.5–1.9)

## 2016-06-02 LAB — LIPASE, BLOOD: Lipase: 137 U/L — ABNORMAL HIGH (ref 11–51)

## 2016-06-02 MED ORDER — VANCOMYCIN HCL 10 G IV SOLR
1750.0000 mg | Freq: Once | INTRAVENOUS | Status: AC
  Administered 2016-06-02: 1750 mg via INTRAVENOUS
  Filled 2016-06-02: qty 1750

## 2016-06-02 MED ORDER — PIPERACILLIN-TAZOBACTAM 3.375 G IVPB
3.3750 g | Freq: Three times a day (TID) | INTRAVENOUS | Status: DC
  Administered 2016-06-02 – 2016-06-07 (×15): 3.375 g via INTRAVENOUS
  Filled 2016-06-02 (×18): qty 50

## 2016-06-02 MED ORDER — VANCOMYCIN HCL IN DEXTROSE 750-5 MG/150ML-% IV SOLN
750.0000 mg | Freq: Two times a day (BID) | INTRAVENOUS | Status: DC
  Administered 2016-06-03 – 2016-06-05 (×5): 750 mg via INTRAVENOUS
  Filled 2016-06-02 (×8): qty 150

## 2016-06-02 NOTE — Progress Notes (Signed)
     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 He is not a surgical candidate due to age and comorbidities. He underwent ERCP by Dr. Ardis Hughs but stent could not be placed so he underwent an internal/external biliary drain placement on September 2. Cholangiogram on September 21 continued to confirm obstruction. Then yesterday, 10/3, he underwent placement of an uncovered metal stent across the distal common bile duct/ampullary tumor with bedside removal of the internal/external drain after confirmation of relief of the obstruction by IR. He's had ongoing fevers since at least 9/26 when it was recommended that he go to the hospital. He has been on Cipro and Flagyl but continues with fevers up to 101.8. Also complaining of increasing shortness of breath and severe cough that has required him to increase his oxygen usage. LFTs performed yesterday show that these are relatively stable as compared to 8 days prior.  Current Medications, Allergies, Past Medical History, Past Surgical History, Family History and Social History were reviewed in [employer] record.   Physical Exam: BP 122/60 (BP Location: Left Arm, Patient Position: Sitting, Cuff Size: Normal)   Pulse 76   Temp 98.8 F (37.1 C)   Ht 5\' 11"  (1.803 m)   Wt 159 lb (72.1 kg)   BMI 22.18 kg/m  General: Chronically ill-appearing white male in no acute distress; in wheelchair Head: Normocephalic and atraumatic Eyes:  Sclerae anicteric, conjunctiva pink  Ears: Normal auditory acuity Lungs:  Decreased BS B/L. Heart: Regular rate and rhythm Abdomen: Soft, non-distended.  Normal bowel sounds.  Non-tender. Musculoskeletal: Symmetrical with no gross deformities  Neurological: Alert oriented x 4, grossly non-focal Psychological:  Alert and  cooperative. Normal mood and affect  Assessment and Recommendations: -82 year old male with a tubulovillous adenoma of the duodenum that is causing biliary obstruction. He continues with fevers but has been on Cipro and Flagyl and just underwent another procedure with metal stent placement and removal of his internal/external biliary drain yesterday. LFTs are stable. He has no abdominal pain. I am not sure that biliary source as the cause of his fevers. He has ongoing cough and shortness of breath as well. He actually has an appointment with his PCP this afternoon also for further evaluation. I will discuss with Dr. Ardis Hughs to see if he has any further recommendations. Patient's LFTs likely just need to be followed periodically to check for obstruction and have also informed them of symptoms such as abdominal pain, yellowing of the skin and eyes, and fevers.

## 2016-06-02 NOTE — ED Notes (Signed)
Compatibility of zosyn and vanc confirmed with PharmD via telephone

## 2016-06-02 NOTE — ED Provider Notes (Signed)
[facility] DEPT Provider Note   CSN: [id number] Arrival date & time: 06/02/16  1635     History   Chief Complaint Chief Complaint  Patient presents with  . Shortness of Breath  . Fever  . Wound Check  . Abdominal Pain    HPI [name] is a 82 y.o. male.  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  Patient reports daily fevers to 101.5 for 3 weeks. Reports was admitted and ended up with biliary drain and then stent yesterday but fevers have continued despite abx.  Has had cough intermittently over last 3 weeks, however worsening over last days. Shortness of breath worsening over the lastweek.  O2 was increased as outpt. Saw GI and family medicine today. No sig abd pain.   Past Medical History:  Diagnosis Date  . Anemia 2014   was on iron pills and then was able to come off  . Arthritis    "all over"  . Asthma       . Atrial flutter ([place])    a. diagnosed 03/2015  . Bacteremia 03/2016  . Complication of anesthesia    B/P dropped low and had to stay in recovery longer  . Coronary artery disease    a. s/p CABG 1986 b. redo CABG 1993 c. PTCA 2008 d. DES 2010  . Depression       . Diverticulosis   . GERD (gastroesophageal reflux disease)       . Hepatitis    hepatitis- long time ago, occupational contamination   . Hiatal hernia   . Hyperlipemia   . Hypertension       . Insomnia       . Kidney stones   . Myocardial infarction   . On home oxygen therapy    "2L; just at night" (01/31/2015)  . Peripheral neuropathy ([place])   . Peripheral vascular disease (HCC)    thrombus- in leg- many yrs. ago- ?R- coumadin x1 mth  . Pneumonia 1960's  . Presence of permanent cardiac pacemaker   . Prostate cancer [facility])    radiation therapy- 2002  . Systolic CHF ([place])       . VT (ventricular tachycardia) ([place]) 06/2012   a. in the setting of STEMI b. on amio during hospitalization    Patient Active Problem List   Diagnosis Date Noted  . Cough 06/02/2016  . HCAP (healthcare-associated pneumonia) 06/02/2016  . Chronic anemia   . Chronic systolic congestive heart failure ([place])   . Chronic bronchitis ([place])   . Common bile duct mass   . Fever 05/26/2016  . Pyrexia   . Biliary drain displacement   . 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 Cholangitis   . Peri-ampullary neoplasm   . Malnutrition of moderate degree 04/29/2016  . Common bile duct obstruction   . Elevated liver function tests   . Bacteremia due to Gram-negative bacteria 04/27/2016  . BPH (benign prostatic hyperplasia)   . Asthma, mild intermittent   . Insomnia 02/18/2016  . SOB (shortness of breath)   . Pacemaker 12/25/2015  . Atrial flutter, RVR 04/10/2015  . Unstable angina (HCC)  04/10/2015  . Coronary artery disease involving native coronary artery of native heart without angina pectoris 04/10/2015  . Urinary retention due to benign prostatic hyperplasia   . Systolic CHF ([place]) 79/89/2119  . Depression 09/26/2013  . Intertrochanteric fracture of right hip ([place]) 08/29/2013  . Hypertension   . GERD (gastroesophageal reflux disease)   . COPD (chronic obstructive pulmonary disease) ([place])   . H/O cardiac arrest   . Junctional bradycardia 07/25/2012  . Glucose intolerance (impaired glucose tolerance) 07/19/2012  . Normocytic anemia 07/19/2012  . Acute MI, anterolateral wall ([place]) 07/16/2012  . Prostatitis 07/16/2012  . E. coli UTI (urinary tract infection) 07/16/2012  . Elevated transaminase level 07/16/2012  . Foot drop, right 07/28/2011  . LBBB (left bundle branch block) 03/29/2011  . Hx of CABG  and re-do CABG  03/29/2011  . Ischemic heart disease   . Myocardial infarction   . Acute systolic congestive heart failure, NYHA class 2-EF 40-45%   . Chronic respiratory failure ([place])   . Chronic asthma   . Peripheral neuropathy ([place])   . Dyslipidemia (high LDL; low HDL)   . Essential hypertension 04/04/2007  . PSORIASIS 04/04/2007  . OSTEOARTHRITIS 04/04/2007  . OSTEOPOROSIS 04/04/2007  . Coronary artery disease due to lipid rich plaque 01/05/2007  . HERNIA 01/05/2007    Past Surgical History:  Procedure Laterality Date  . APPENDECTOMY  1943  . BILIARY DRAINAGE  05/14/2016  . CATARACT EXTRACTION W/ INTRAOCULAR LENS  IMPLANT, BILATERAL    . COLONOSCOPY    . CORONARY ARTERY BYPASS GRAFT  1986;  1993   CABG X 5; CABG X 1  . CYSTOSCOPY W/ URETERAL STENT PLACEMENT Right 02/28/2013   Procedure: CYSTOSCOPY WITH RETROGRADE PYELOGRAM/URETERAL STENT PLACEMENT;  Surgeon: Dutch Gray, MD;  Location: [facility];  Service: Urology;  Laterality: Right;  . CYSTOSCOPY WITH URETEROSCOPY AND STENT PLACEMENT Right 03/23/2013   Procedure: RIGHT URETEROSCOPY, LASER LITHO AND STENT PLACEMENT;  Surgeon: Fredricka Bonine, MD;  Location: [facility];  Service: Urology;  Laterality: Right;  . ENDOSCOPIC RETROGRADE CHOLANGIOPANCREATOGRAPHY (ERCP) WITH PROPOFOL  04/30/2016  . EP IMPLANTABLE DEVICE N/A 04/11/2015   Procedure: Pacemaker Implant;  Surgeon: Will Meredith Leeds, MD;  Location: [place] CV LAB;  Service: Cardiovascular;  Laterality: N/A;  . ERCP N/A 04/30/2016   Procedure: ENDOSCOPIC RETROGRADE CHOLANGIOPANCREATOGRAPHY (ERCP);  Surgeon: Milus Banister, MD;  Location: [place];  Service: Endoscopy;  Laterality: N/A;  . FOOT FUSION Left 2000   heel  . FRACTURE SURGERY     heel- crushed -2002,  (hardware)   . HAMMER TOE SURGERY Right    little toe  . HARDWARE REMOVAL Right 2002   great toe; Dr. Sharol Given  . HOLMIUM LASER APPLICATION Right 12/15/4079   Procedure: HOLMIUM LASER APPLICATION;  Surgeon:  Fredricka Bonine, MD;  Location: [facility];  Service: Urology;  Laterality: Right;  . INGUINAL HERNIA REPAIR Left 01/31/2015   Procedure: OPEN REPAIR RECURRENT LEFT INGUINAL HERNIA;  Surgeon: Fanny Skates, MD;  Location: [place];  Service: General;  Laterality: Left;  . INSERTION OF MESH Left 01/31/2015   Procedure: INSERTION OF MESH;  Surgeon: Fanny Skates, MD;  Location: [facility];  Service: General;  Laterality: Left;  . INTRAMEDULLARY (IM) NAIL INTERTROCHANTERIC Right 08/29/2013   Procedure: INTRAMEDULLARY (IM) NAIL INTERTROCHANTRIC;  Surgeon: Marianna Payment, MD;  Location: [facility];  Service: Orthopedics;  Laterality: Right;  . IR GENERIC HISTORICAL  05/01/2016   IR INT EXT BILIARY DRAIN WITH CHOLANGIOGRAM 05/01/2016 York Cerise  Wagner, DO [facility] RAD  . IR GENERIC HISTORICAL  06/01/2016   IR BILIARY STENT(S) EXISTING ACCESS INC DILATION CATH EXCHANGE 06/01/2016 Corrie Mckusick, DO [facility]  . JOINT REPLACEMENT     R great toe  . KNEE ARTHROSCOPY Right X 2  . LEFT AND RIGHT HEART CATHETERIZATION WITH CORONARY ANGIOGRAM N/A 07/15/2012   Procedure: LEFT AND RIGHT HEART CATHETERIZATION WITH CORONARY ANGIOGRAM;  Surgeon: Jettie Booze, MD;  Location: [facility] C[facility] LAB;  Service: Cardiovascular;  Laterality: N/A;  . LUMBAR LAMINECTOMY/DECOMPRESSION MICRODISCECTOMY  08/10/2011   Procedure: LUMBAR LAMINECTOMY/DECOMPRESSION MICRODISCECTOMY;  Surgeon: Cooper Render Pool;  Location: [place] NEURO ORS;  Service: Neurosurgery;  Laterality: Right;  RIGHT Lumbar five-sacral one LAMINECTOMY, MICRODISCECTOMY  . LUMBAR WOUND DEBRIDEMENT  08/22/2011   Procedure: LUMBAR WOUND DEBRIDEMENT;  Surgeon: Eustace Moore;  Location: [place] [facility]URO ORS;  Service: Neurosurgery;  Laterality: N/A;  Repair of CSF Leak requiring laminectomy  . PERCUTANEOUS CORONARY STENT INTERVENTION (PCI-S) N/A 07/15/2012   Procedure: PERCUTANEOUS CORONARY STENT INTERVENTION (PCI-S);  Surgeon: Jettie Booze, MD;  Location: [facility] CATH LAB;  Service:  Cardiovascular;  Laterality: N/A;  . RHINOPLASTY  [phone]  . TEE WITHOUT CARDIOVERSION N/A 04/28/2016   Procedure: TRANSESOPHAGEAL ECHOCARDIOGRAM (TEE);  Surgeon: Skeet Latch, MD;  Location: [facility] ENDOSCOPY;  Service: Cardiovascular;  Laterality: N/A;  . TONSILLECTOMY  1940  . TRANSURETHRAL RESECTION OF PROSTATE  x2       Home Medications    Prior to Admission medications   Medication Sig Start Date End Date Taking? Authorizing Provider  acetaminophen (TYLENOL) 500 MG tablet Take 1,000 mg by mouth every 6 (six) hours as needed for fever.   Yes Historical Provider, MD  albuterol (PROVENTIL HFA;VENTOLIN HFA) 108 (90 Base) MCG/ACT inhaler Inhale 2 puffs into the lungs every 6 (six) hours as needed for wheezing or shortness of breath. 12/30/15  Yes Mercy Riding, MD  Amino Acids-Protein Hydrolys (FEEDING SUPPLEMENT, PRO-STAT SUGAR FREE 64,) LIQD Take 30 mLs by mouth 2 (two) times daily. 05/02/16  Yes Sela Hilding, MD  aspirin EC 81 MG EC tablet Take 1 tablet (81 mg total) by mouth daily. 04/12/15  Yes Luke K Kilroy, PA-C  budesonide-formoterol (SYMBICORT) 80-4.5 MCG/ACT inhaler Inhale 2 puffs into the lungs 2 (two) times daily. 09/12/12  Yes Tanda Rockers, MD  Ca Phosphate-Cholecalciferol (CALTRATE GUMMY BITES) 250-400 MG-UNIT CHEW Chew 2 tablets by mouth 2 (two) times daily.   Yes Historical Provider, MD  carbamazepine (TEGRETOL) 100 MG chewable tablet Chew 300 mg by mouth 2 (two) times daily.   Yes Historical Provider, MD  docusate sodium (COLACE) 100 MG capsule Take 100 mg by mouth daily.    Yes Historical Provider, MD  feeding supplement, ENSURE ENLIVE, (ENSURE ENLIVE) LIQD Take 237 mLs by mouth 2 (two) times daily between meals. 05/02/16  Yes Sela Hilding, MD  finasteride (PROSCAR) 5 MG tablet Take 5 mg by mouth every morning.    Yes Historical Provider, MD  ipratropium (ATROVENT HFA) 17 MCG/ACT inhaler Inhale 2 puffs into the lungs 2 (two) times daily as needed for wheezing.    Yes  Historical Provider, MD  ipratropium-albuterol (DUONEB) 0.5-2.5 (3) MG/3ML SOLN Take 3 mLs by nebulization every 6 (six) hours as needed. Patient taking differently: Take 3 mLs by nebulization every 6 (six) hours as needed (for SOB).  05/31/16  Yes Elsia Nigel Sloop, DO  isosorbide mononitrate (IMDUR) 30 MG 24 hr tablet Take 1 tablet (30 mg total) by mouth daily. 05/02/16  Yes  Sela Hilding, MD  Melatonin 3 MG TABS Take 3 mg by mouth at bedtime.   Yes Historical Provider, MD  metoprolol succinate (TOPROL-XL) 12.5 mg TB24 24 hr tablet Take 12.5 mg by mouth daily.   Yes Historical Provider, MD  OXYGEN Inhale 3 L/min into the lungs continuous.   Yes Historical Provider, MD  potassium chloride SA (K-DUR,KLOR-CON) 20 MEQ tablet Take 20 mEq by mouth daily.    Yes Historical Provider, MD  ranitidine (ZANTAC) 150 MG tablet TAKE 1 TABLET BY MOUTH 2 TIMES DAILY. Patient taking differently: Take 300 mg by mouth at bedtime 10/29/15  Yes Darlin Coco, MD  senna (SENOKOT) 8.6 MG tablet Take 1 tablet by mouth daily.    Yes Historical Provider, MD  sertraline (ZOLOFT) 100 MG tablet Take 150 mg by mouth every morning.    Yes Historical Provider, MD  tamsulosin (FLOMAX) 0.4 MG CAPS capsule Take 1 capsule (0.4 mg total) by mouth daily. 09/06/14  Yes  N Rumley, DO  traZODone (DESYREL) 50 MG tablet Take 1 tablet (50 mg total) by mouth at bedtime. 05/02/16  Yes Sela Hilding, MD  ipratropium (ATROVENT) 0.02 % nebulizer solution Take 2.5 mLs (0.5 mg total) by nebulization 2 (two) times daily as needed for wheezing or shortness of breath. Patient not taking: Reported on 06/02/2016 05/02/16   Sela Hilding, MD  nitroGLYCERIN (NITROSTAT) 0.4 MG SL tablet Place 1 tablet (0.4 mg total) under the tongue every 5 (five) minutes as needed for chest pain (As needed for chest pain). 05/02/16   Sela Hilding, MD  tiZANidine (ZANAFLEX) 2 MG tablet Take 1 tablet (2 mg total) by mouth every 8 (eight) hours as needed for  muscle spasms. 05/28/16   Steve Rattler, DO    Family History Family History  Problem Relation Age of Onset  . Heart attack Mother   . Addison's disease Father   . Heart attack Sister   . Anesthesia problems Neg Hx   . Hypotension Neg Hx   . Malignant hyperthermia Neg Hx   . Pseudochol deficiency Neg Hx     Social History Social History  Substance Use Topics  . Smoking status: Never Smoker  . Smokeless tobacco: Never Used  . Alcohol use No     Allergies   Review of patient's allergies indicates no known allergies.   Review of Systems Review of Systems  Constitutional: Positive for fatigue and fever.  HENT: Negative for sore throat.   Eyes: Negative for visual disturbance.  Respiratory: Positive for cough and shortness of breath.   Cardiovascular: Negative for chest pain.  Gastrointestinal: Negative for abdominal pain, blood in stool, constipation, nausea and vomiting.  Genitourinary: Negative for difficulty urinating and dysuria.  Musculoskeletal: Negative for back pain and neck stiffness.  Skin: Negative for rash.  Neurological: Negative for syncope and headaches.     Physical Exam Updated Vital Signs BP 129/61 (BP Location: Left Arm)   Pulse 73   Temp 98.6 F (37 C) (Oral)   Resp 18   Ht 5' 10"  (1.778 m)   Wt 158 lb 9.6 oz (71.9 kg)   SpO2 99%   BMI 22.76 kg/m   Physical Exam  Constitutional: He is oriented to person, place, and time. He appears well-developed and well-nourished. No distress.  HENT:  Head: Normocephalic and atraumatic.  Eyes: Conjunctivae and EOM are normal.  Neck: Normal range of motion.  Cardiovascular: Normal rate, regular rhythm, normal heart sounds and intact distal pulses.  Exam reveals no  gallop and no friction rub.   No murmur heard. Pulmonary/Chest: Effort normal. No respiratory distress. He has decreased breath sounds. He has no wheezes. He has no rales.  Abdominal: Soft. He exhibits no distension. There is no tenderness.  There is no guarding.  Biliary drain site, healing tissue, no erythema, no purulence  Musculoskeletal: He exhibits no edema.  Neurological: He is alert and oriented to person, place, and time.  Skin: Skin is warm and dry. He is not diaphoretic.  Nursing note and vitals reviewed.    ED Treatments / Results  Labs (all labs ordered are listed, but only abnormal results are displayed) Labs Reviewed  LIPASE, BLOOD - Abnormal; Notable for the following:       Result Value   Lipase 137 (*)    All other components within normal limits  COMPREHENSIVE METABOLIC PANEL - Abnormal; Notable for the following:    Glucose, Bld 116 (*)    Albumin 2.2 (*)    AST 56 (*)    Alkaline Phosphatase 279 (*)    All other components within normal limits  CBC - Abnormal; Notable for the following:    WBC 11.5 (*)    RBC 3.25 (*)    Hemoglobin 9.3 (*)    HCT 29.8 (*)    Platelets 598 (*)    All other components within normal limits  URINALYSIS, ROUTINE W REFLEX MICROSCOPIC (NOT AT [facility]) - Abnormal; Notable for the following:    Specific Gravity, Urine 1.034 (*)    Leukocytes, UA TRACE (*)    All other components within normal limits  URINE MICROSCOPIC-ADD ON - Abnormal; Notable for the following:    Squamous Epithelial / LPF 0-5 (*)    Bacteria, UA FEW (*)    Casts HYALINE CASTS (*)    Crystals CA OXALATE CRYSTALS (*)    All other components within normal limits  BASIC METABOLIC PANEL - Abnormal; Notable for the following:    Glucose, Bld 112 (*)    All other components within normal limits  CBC - Abnormal; Notable for the following:    RBC 3.01 (*)    Hemoglobin 8.5 (*)    HCT 27.6 (*)    Platelets 512 (*)    All other components within normal limits  D-DIMER, QUANTITATIVE (NOT AT [facility]) - Abnormal; Notable for the following:    D-Dimer, Quant 3.91 (*)    All other components within normal limits  CULTURE, BLOOD (ROUTINE X 2)  CULTURE, BLOOD (ROUTINE X 2)  URINE CULTURE  CULTURE,  EXPECTORATED SPUTUM-ASSESSMENT  GRAM STAIN  STREP PNEUMONIAE URINARY ANTIGEN  I-STAT CG4 LACTIC ACID, ED  I-STAT CG4 LACTIC ACID, ED    EKG  EKG Interpretation  Date/Time:  Wednesday June 02 2016 19:02:25 EDT Ventricular Rate:  80 PR Interval:    QRS Duration: 162 QT Interval:  425 QTC Calculation: 491 R Axis:   77 Text Interpretation:  Sinus rhythm Atrial premature complexes Left bundle branch block No significant change since last tracing Confirmed by [facility] MD, Braley Luckenbaugh ([id number]) on 06/02/2016 7:08:09 PM       Radiology Dg Chest 2 View  Result Date: 06/02/2016 CLINICAL DATA:  82 y/o M; shortness of breath with right lower chest pain beginning today. Recent stent placement for gallbladder. EXAM: CHEST  2 VIEW COMPARISON:  05/26/2016 chest radiograph. FINDINGS: Stable cardiomegaly. Aortic atherosclerosis with arch calcifications. Two lead pacemaker. Sternotomy wires are intact. Increasing right basilar opacification may represent worsening atelectasis or developing pneumonia. Small  right pleural effusion. IMPRESSION: Increasing right basilar opacification may represent worsening atelectasis or developing pneumonia. Small right pleural effusion. Electronically Signed   By: Kristine Garbe M.D.   On: 06/02/2016 21:00   Ct Angio Chest Pe W Or Wo Contrast  Result Date: 06/03/2016 CLINICAL DATA:  Chest pain, shortness of breath. EXAM: CT ANGIOGRAPHY CHEST WITH CONTRAST TECHNIQUE: Multidetector CT imaging of the chest was performed using the standard protocol during bolus administration of intravenous contrast. Multiplanar CT image reconstructions and MIPs were obtained to evaluate the vascular anatomy. CONTRAST:  80 mL of Isovue 370 intravenously. COMPARISON:  Radiographs of June 02, 2016. FINDINGS: Cardiovascular: Atherosclerosis of thoracic aorta is noted without aneurysm. Status post coronary artery bypass graft. There is no definite evidence of pulmonary embolus.  Mediastinum/Nodes: No mediastinal mass or adenopathy is noted. Lungs/Pleura: No pneumothorax is noted. Mild left posterior basilar subsegmental atelectasis is noted. Large loculated right pleural effusion is noted. Upper Abdomen: Left hepatic pneumobilia is noted. Biliary stent is noted in common bile duct. Musculoskeletal: No significant osseous abnormality is noted. Review of the MIP images confirms the above findings. IMPRESSION: Aortic atherosclerosis. No evidence of pulmonary embolus. Large loculated right pleural effusion is noted. Electronically Signed   By: Marijo Conception, M.D.   On: 06/03/2016 12:26    Procedures Procedures (including critical care time)  Medications Ordered in ED Medications  vancomycin (VANCOCIN) IVPB 750 mg/150 ml premix (750 mg Intravenous Given 06/03/16 0835)  piperacillin-tazobactam (ZOSYN) IVPB 3.375 g (3.375 g Intravenous New Bag/Given 06/03/16 1124)  metoprolol succinate (TOPROL-XL) 24 hr tablet 12.5 mg (12.5 mg Oral Given 06/03/16 1013)  tamsulosin (FLOMAX) capsule 0.4 mg (0.4 mg Oral Given 06/03/16 1010)  mometasone-formoterol (DULERA) 100-5 MCG/ACT inhaler 2 puff (2 puffs Inhalation Given 06/03/16 0856)  finasteride (PROSCAR) tablet 5 mg (5 mg Oral Given 06/03/16 1010)  aspirin EC tablet 81 mg (81 mg Oral Given 06/03/16 1010)  feeding supplement (ENSURE ENLIVE) (ENSURE ENLIVE) liquid 237 mL (237 mLs Oral Given 06/03/16 1014)  isosorbide mononitrate (IMDUR) 24 hr tablet 30 mg (30 mg Oral Given 06/03/16 1013)  Melatonin TABS 3 mg (3 mg Oral Given 06/03/16 0105)  nitroGLYCERIN (NITROSTAT) SL tablet 0.4 mg (not administered)  traZODone (DESYREL) tablet 50 mg (50 mg Oral Given 06/03/16 0105)  sertraline (ZOLOFT) tablet 150 mg (150 mg Oral Given 06/03/16 1010)  carbamazepine (TEGRETOL) chewable tablet 300 mg (300 mg Oral Given 06/03/16 1006)  enoxaparin (LOVENOX) injection 40 mg (40 mg Subcutaneous Given 06/03/16 0107)  0.9 %  sodium chloride infusion (1,000 mLs Intravenous  New Bag/Given 06/03/16 0112)  ipratropium-albuterol (DUONEB) 0.5-2.5 (3) MG/3ML nebulizer solution 3 mL (3 mLs Nebulization Given 06/03/16 0225)  albuterol (PROVENTIL) (2.5 MG/3ML) 0.083% nebulizer solution 2.5 mg (not administered)  MEDLINE mouth rinse (15 mLs Mouth Rinse Given 06/03/16 1000)  morphine 2 MG/ML injection 2 mg (2 mg Intravenous Given 06/03/16 1007)  vancomycin (VANCOCIN) 1,750 mg in sodium chloride 0.9 % 500 mL IVPB (0 mg Intravenous Stopped 06/02/16 2346)  iopamidol (ISOVUE-370) 76 % injection (80 mLs  Contrast Given 06/03/16 1204)     Initial Impression / Assessment and Plan / ED Course  I have reviewed the triage vital signs and the nursing notes.  Pertinent labs & imaging results that were available during my care of the patient were reviewed by me and considered in my medical decision making (see chart for details).  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  Patient afebrile in ED, stable on 3L of O2, normal lactic acid, however with findings of pneumonia on XR.  His transaminases, alk phos, bili are stable, and patient without abdominal tenderness and doubt intraabdominal infection or new biliary obstruction.  Patient to be admitted for concern for HCAP, continuing fevers. Given vanc/zosyn.  Final Clinical Impressions(s) / ED Diagnoses   Final diagnoses:  HCAP (healthcare-associated pneumonia)  Dyspnea    New Prescriptions Current Discharge Medication List       Gareth Morgan, MD 06/03/16 [phone]

## 2016-06-02 NOTE — Patient Instructions (Signed)
Once we review with Dr Ardis Hughs we will contact you with any further  recommendations  Please keep your cough and fever monitored by your primary care physician  Keep your scheduled appointment today with your PCP

## 2016-06-02 NOTE — ED Notes (Signed)
Admitting team at bedside.

## 2016-06-02 NOTE — Patient Instructions (Signed)
Thank you so much for coming to visit today! I would recommend going to the Emergency Department for fluids, labs, and an xray. I am hopeful that they may be able to treat you as an outpatient, but if some of the labs or imaging comes back as abnormal you may need to be admitted. If you are discharged from the ED, I recommend following back up at our office Friday 10/6.  Dr. Gerlean Ren

## 2016-06-02 NOTE — ED Notes (Signed)
Attempted report x 1; name and call back number provided 

## 2016-06-02 NOTE — ED Notes (Signed)
EKG given to Dr. Schlossman 

## 2016-06-02 NOTE — Progress Notes (Signed)
Subjective:     Patient ID: [name], male   DOB: [birth year], 82 y.o.   MRN: [id number]  HPI [name] is an 82yo male presenting today for worsening fevers, shortness of breath, cough, and oxygen requirement. - Fevers of 101F every 8hr x1week. Improved with Tylenol. - Coughing up white mucous - Oxygen requirement increased to 3L, baseline 2L. Reports O2 saturation at home of 92-93% - Reports some improvement with Nebulizer treatments. Has been using twice daily with last use this morning. - Currently taking Ciprofloxacin and Flagyl for GI complaints during recent hospitalization - Feels like he is getting worse over the course of a week - Does not wish to be hospitalized, but agreeable to going to ED to complete labs, imaging, and obtain fluids.  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 Had internal/external biliary drain placed 05/01/16. Placement of uncovered metal stent across distal common bile duct/ampullary tumor with bedside removal of internal/external drain on 06/01/16. Obstruction confirmed to be resolved by IR.   Review of Systems Per HPI. Other systems negative.    Objective:   Physical Exam  Constitutional:  Thin and pleasant 82yo male presenting in wheelchair  HENT:  Dry mucous membranes  Cardiovascular: Normal rate and regular rhythm.   Murmur heard. Pulmonary/Chest: Effort normal.  [place] in place, Bibas[place] crackles  Musculoskeletal: He exhibits edema.  Psychiatric: He has a normal mood and affect. His behavior is normal.   Vitals:   06/02/16 1527  BP: (!) 140/59  Pulse: 71  Temp: 97.8 F (36.6 C)      Assessment and Plan:     1. HCAP (healthcare-associated pneumonia) - Concern for HCAP given fevers, shortness of breath, cough, worsening oxygen requirement. - Does not wish to be admitted. Agreeable to trip to ED for fluids, imaging, and labs (CMP, CBC, Lactic Acid, Cultures) - Currently on antibiotics, however they do  not treat pneumonia. Anticipate transition to alternative antibiotic pending xray and labs. - Also notes that hemoglobin has been below baseline. Suspect shortness of breath caused by pneumonia, but symptomatic anemia also considered in differential. Significant cardiac history also noted, which could contribute to worsening respiratory status. - Nursing to wheel to ED. ED called and discussed arrival of patient. Inpatient team notified of patient in ED and encouraged to call with any questions. - Written prescription sent to [place] for oxygen given new requirement

## 2016-06-02 NOTE — Progress Notes (Signed)
Pharmacy Antibiotic Note  [name] is a 82 y.o. male admitted on 06/02/2016 with surgical wound infection.  Pharmacy has been consulted for vancomycin and zosyn dosing.  Plan: Vancomycin 1750 mg x1 then 750 mg q12h Zosyn 3.375 q8h extended infusion Monitor LFTs Monitor renal function, Cx, vanc troughs prn    Temp (24hrs), Avg:98.2 F (36.8 C), Min:97.8 F (36.6 C), Max:98.8 F (37.1 C)   Recent Labs Lab 05/27/16 0823 05/28/16 0448 06/01/16 0823 06/02/16 1704 06/02/16 1717  WBC 7.1 7.3 11.0* 11.5*  --   CREATININE  --   --  0.82 0.79  --   LATICACIDVEN  --   --   --   --  1.16    Estimated Creatinine Clearance: 63.4 mL/min (by C-G formula based on SCr of 0.79 mg/dL).    No Known Allergies  Antimicrobials this admission:  Vanc 10/4 >> Zosyn 10/4 >>  Dose adjustments this admission:  N/A  Microbiology results:  10/4 BCx: Sent 10/4 UCx: Sent  Thank you for allowing pharmacy to be a part of this patient's care.  Cheral Almas, PharmD Candidate 06/02/2016 7:38 PM

## 2016-06-02 NOTE — Progress Notes (Signed)
I agree with the above.  His biliary obstruction should be well treated with the permanent metal stent recently placed by IR.  Looks like he was sent to the ER from his PCP office.  Possibly pneumonia.  Patty, He needs rov with me in 6-7 weeks, with LFTs shortly prior.  If he is recovered from this acute illness, can consider repeat endoscopy for another biopsy of the duodenal neoplasm.  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

## 2016-06-02 NOTE — ED Triage Notes (Signed)
Pt c/o fever, shortness of breath, abdominal pain since yesterday. Pt had surgery yesterday to put a stent into his gall bladder. Site has purulent drainage. Pt is on 3 liters of home O2, pt reports a fever of 101 at home. Pt took tylenol this morning. Pt followed up with his GI doctors, states source of fever is coming from another source. Pt is a Orthoptist pt.

## 2016-06-02 NOTE — ED Notes (Signed)
Patient transported to X-ray via stretcher 

## 2016-06-02 NOTE — H&P (Signed)
[facility] Admission History and Physical Service Pager: [phone]  Patient name: [name] Medical record number: [id number] Date of birth: 1924/12/21 Age: 82 y.o. Gender: male  Primary Care Provider: Bufford Lope, DO Consultants: None Code Status: FULL  Chief Complaint:  Fever, shortness of breath  Assessment and Plan: [name] is a 82 y.o. male presenting with fever, shortness of breath. PMH is significant for essential hypertension, CAD, hernia, psoriais, OA, ischemic heart disease, MI, CHF, asthma, dyslipidemia, hx of CABG, depression and GERD.   HCAP. Concern for HCAP given fevers, shortness of breath, cough, worsening o2 requirement (baseline 3L). Likely cause of recent fevers (patient reports spiking fevers at home every 6-8 hours following surgical procedure). Patient had recent stent placement in gallbladder. CXR showing increasing right basilar opacification, possibly representing worsening atelectasis or developing pneumonia. Small right pleural effusion.  Elevated white count at 11.5. Lactic acid normal. -admit to [facility]PTS, attending Dr. [name] -continue Vanc/Zosyn per pharm consult -Bcx, Ucx, sputum culture -Strep pneumo urinary antigen -EKG ordered, follow up -cardiac monitoring, continuous pulse ox -vitals per unit routine -monitor respiratory status; O2 as needed to maintain sat >90% -trend fevers -PT/OT eval  -gentle IVF hydration @100mL /hr  Abdominal pain. Multifactorial. Could be due to recent surgery (see below) vs pancreatitis. Elevated lipase on admission was 137. Patient with poor PO intake. No known n/v.  -continue to monitor -IV hydration -repeat liapse in AM  Anemia. On admission Hbg 9.3, baseline appears to be ~9.0 now but last year was 59. No known bleeding currently. Recent surgeries could be cause of anemia. Asymptomatic. -continue to trend Hbg -consider FOBT -transfusion threshold <7  Recent Surgery. 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 Metal stent placement and removal of his internal/external biliary drain. GI follwoed up with patient today and they do not believe biliary source as the cause of his fevers. LFTs slighly elevated on admission. Could be cause of abdominal pain.  -monitor LFTS intermittently -hold off on cipro and flagyl; on vanc/zoysn IV for HCAP which covers same pathogens   HTN. BP normal on admission.  -continue home regimen of imdur, metoprolol -monitor  Chronic systolic HF. Last Echo on 03/2016 showing normal EF and diastolic dysfunction. Prior echos have showed G1DD with pEF.  Last followed with cardiology in August. -give IVF now considering poor PO intake and dehydration; consider stopping after 12 hrs -not on diuretic at baseline -continue home metoprolol and imdur  Hx of CABG with CAD. HLD. He had CABG in 1986. He had a redo CABG in 1993. He had PCI in 2008 and 2010. 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 Stable.  -Continue daily aspirin 81 mg -Nitroglycerin sublingual if needed for chest pain -not on statin therapy  Asthma. Worsened in setting of pneumonia with wheezing on exam.  -Continue Dulera -prn breathing treatments  Depression. Stable -continue home Zoloft 150 mg daily  BPH -continue Flomax 0.4 mg daily and finasteride -monitor UOP  Insomnia -Continue home Melatonin 3 mg -Continue home Trazodone 50 mg daily  A. Flutter. S/p pacemaker in 2016. Not on anticoagulation given history of falls and syncope.  -Continue beta blocker.  FEN/GI: Diet heart, IV NS @100  cc/hr, Ensure supplement bid between meals Prophylaxis: Lovenox  Disposition: Admit to [facility], attending Dr. [name].  History of Present Illness:  [name] is a 82 y.o. male presenting with fever,  shortness of breath and abdominal pain since 10/3.   Was seeing PCP in clinic today  for follow up on his recent surgery.  Reports he has been spiking fevers every 6-8 hours. Reports fever of 101 at home. Lives at home with wife.  Was taking Tylenol to reduce fever which helped.  Has been wheezing more, has dyspnea and a cough.  Surgery 2 days ago for stent placement in his gallbladder.  Symptoms began several weeks ago.  Patient followed up with GI doctors and they stated source of fever is elsewhere. Reports his appetite has decreased, has some burning chest pain.  Was told he has a mass in his stomach that is precancerous.  At home, wears 3L of oxygen and feels as though it has been harder to breathe.  States his nebulizer treatments help him.   Review Of Systems: Per HPI.   Review of Systems  Constitutional: Positive for fever and malaise/fatigue.  Eyes: Negative for blurred vision.  Respiratory: Positive for cough, shortness of breath and wheezing.   Cardiovascular: Positive for chest pain.  Gastrointestinal: Negative for diarrhea, nausea and vomiting.    Patient Active Problem List   Diagnosis Date Noted  . Cough 06/02/2016  . HCAP (healthcare-associated pneumonia) 06/02/2016  . Chronic anemia   . Chronic systolic congestive heart failure ([place])   . Chronic bronchitis ([place])   . Common bile duct mass   . Fever 05/26/2016  . Pyrexia   . Biliary drain displacement   . 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 Cholangitis   . Peri-ampullary neoplasm   . Malnutrition of moderate degree 04/29/2016  . Common bile duct obstruction   . Elevated liver function tests   . Bacteremia due to Gram-negative bacteria 04/27/2016  . BPH (benign prostatic hyperplasia)   . Asthma, mild intermittent   . Insomnia 02/18/2016  . SOB (shortness of breath)   . Pacemaker 12/25/2015  . Atrial flutter, RVR 04/10/2015  . Unstable angina ([place]) 04/10/2015  . Coronary artery disease involving native coronary artery of native heart without angina pectoris 04/10/2015  . Urinary retention due to benign  prostatic hyperplasia   . Systolic CHF ([place]) [id number]  . Depression 09/26/2013  . Intertrochanteric fracture of right hip ([place]) 08/29/2013  . Hypertension   . GERD (gastroesophageal reflux disease)   . COPD (chronic obstructive pulmonary disease) ([place])   . H/O cardiac arrest   . Junctional bradycardia 07/25/2012  . Glucose intolerance (impaired glucose tolerance) 07/19/2012  . Normocytic anemia 07/19/2012  . Acute MI, anterolateral wall ([place]) 07/16/2012  . Prostatitis 07/16/2012  . E. coli UTI (urinary tract infection) 07/16/2012  . Elevated transaminase level 07/16/2012  . Foot drop, right 07/28/2011  . LBBB (left bundle branch block) 03/29/2011  . Hx of CABG and re-do CABG  03/29/2011  . Ischemic heart disease   . Myocardial infarction   . Acute systolic congestive heart failure, NYHA class 2-EF 40-45%   . Chronic respiratory failure ([place])   . Chronic asthma   . Peripheral neuropathy ([place])   . Dyslipidemia (high LDL; low HDL)   . Essential hypertension 04/04/2007  . PSORIASIS 04/04/2007  . OSTEOARTHRITIS 04/04/2007  . OSTEOPOROSIS 04/04/2007  . Coronary artery disease due to lipid rich plaque 01/05/2007  . HERNIA 01/05/2007   Past Medical History: Past Medical History:  Diagnosis Date  . Anemia 2014   was on iron pills and then was able to come off  . Arthritis    "all over"  . Asthma       .  Atrial flutter ([place])    a. diagnosed 03/2015  . Bacteremia 03/2016  . Complication of anesthesia    B/P dropped low and had to stay in recovery longer  . Coronary artery disease    a. s/p CABG 1986 b. redo CABG 1993 c. PTCA 2008 d. DES 2010  . Depression       . Diverticulosis   . GERD (gastroesophageal reflux disease)       . Hepatitis    hepatitis- long time ago, occupational contamination   . Hiatal hernia   . Hyperlipemia   . Hypertension       . Insomnia       . Kidney stones   . Myocardial infarction   . On home oxygen therapy    "2L; just at night"  (01/31/2015)  . Peripheral neuropathy ([place])   . Peripheral vascular disease (H[facility])    thrombus- in leg- many yrs. ago- ?R- coumadin x1 mth  . Pneumonia 1960's  . Presence of permanent cardiac pacemaker   . Prostate cancer [facility])    radiation therapy- 2002  . Systolic CHF ([place])       . VT (ventricular tachycardia) ([place]) 06/2012   a. in the setting of STEMI b. on amio during hospitalization   Past Surgical History: Past Surgical History:  Procedure Laterality Date  . APPENDECTOMY  1943  . BILIARY DRAINAGE  05/14/2016  . CATARACT EXTRACTION W/ INTRAOCULAR LENS  IMPLANT, BILATERAL    . COLONOSCOPY    . CORONARY ARTERY BYPASS GRAFT  1986;  1993   CABG X 5; CABG X 1  . CYSTOSCOPY W/ URETERAL STENT PLACEMENT Right 02/28/2013   Procedure: CYSTOSCOPY WITH RETROGRADE PYELOGRAM/URETERAL STENT PLACEMENT;  Surgeon: Dutch Gray, MD;  Location: [facility];  Service: Urology;  Laterality: Right;  . CYSTOSCOPY WITH URETEROSCOPY AND STENT PLACEMENT Right 03/23/2013   Procedure: RIGHT URETEROSCOPY, LASER LITHO AND STENT PLACEMENT;  Surgeon: Fredricka Bonine, MD;  Location: [facility];  Service: Urology;  Laterality: Right;  . ENDOSCOPIC RETROGRADE CHOLANGIOPANCREATOGRAPHY (ERCP) WITH PROPOFOL  04/30/2016  . EP IMPLANTABLE DEVICE N/A 04/11/2015   Procedure: Pacemaker Implant;  Surgeon: Will Meredith Leeds, MD;  Location: [place] CV LAB;  Service: Cardiovascular;  Laterality: N/A;  . ERCP N/A 04/30/2016   Procedure: ENDOSCOPIC RETROGRADE CHOLANGIOPANCREATOGRAPHY (ERCP);  Surgeon: Milus Banister, MD;  Location: [place] [facility];  Service: Endoscopy;  Laterality: N/A;  . FOOT FUSION Left 2000   heel  . FRACTURE SURGERY     heel- crushed -2002,  (hardware)   . HAMMER TOE SURGERY Right    little toe  . HARDWARE REMOVAL Right 2002   great toe; Dr. Sharol Given  . HOLMIUM LASER APPLICATION Right [id number]   Procedure: HOLMIUM LASER APPLICATION;  Surgeon: Fredricka Bonine, MD;  Location: [facility];  Service: Urology;   Laterality: Right;  . INGUINAL HERNIA REPAIR Left 01/31/2015   Procedure: OPEN REPAIR RECURRENT LEFT INGUINAL HERNIA;  Surgeon: Fanny Skates, MD;  Location: [facility];  Service: General;  Laterality: Left;  . INSERTION OF MESH Left 01/31/2015   Procedure: INSERTION OF MESH;  Surgeon: Fanny Skates, MD;  Location: [place];  Service: General;  Laterality: Left;  . INTRAMEDULLARY (IM) NAIL INTERTROCHANTERIC Right 08/29/2013   Procedure: INTRAMEDULLARY (IM) NAIL INTERTROCHANTRIC;  Surgeon: Marianna Payment, MD;  Location: [facility];  Service: Orthopedics;  Laterality: Right;  . IR GENERIC HISTORICAL  05/01/2016   IR INT EXT BILIARY DRAIN WITH CHOLANGIOGRAM 05/01/2016 Corrie Mckusick, DO [facility]  .  IR GENERIC HISTORICAL  06/01/2016   IR BILIARY STENT(S) EXISTING ACCESS INC DILATION CATH EXCHANGE 06/01/2016 Corrie Mckusick, DO [facility]  . JOINT REPLACEMENT     R great toe  . KNEE ARTHROSCOPY Right X 2  . LEFT AND RIGHT HEART CATHETERIZATION WITH CORONARY ANGIOGRAM N/A 07/15/2012   Procedure: LEFT AND RIGHT HEART CATHETERIZATION WITH CORONARY ANGIOGRAM;  Surgeon: Jettie Booze, MD;  Location[facility] CATH LAB;  Service: Cardiovascular;  Laterality: N/A;  . LUMBAR LAMINECTOMY/DECOMPRESSION MICRODISCECTOMY  08/10/2011   Procedure: LUMBAR LAMINECTOMY/DECOMPRESSION MICRODISCECTOMY;  Surgeon: Cooper Render Pool;  Location: [place] NEURO ORS;  Service: Neurosurgery;  Laterality: Right;  RIGHT Lumbar five-sacral one LAMINECTOMY, MICRODISCECTOMY  . LUMBAR WOUND DEBRIDEMENT  08/22/2011   Procedure: LUMBAR WOUND DEBRIDEMENT;  Surgeon: Eustace Moore;  Location: [facility] NEURO ORS;  Service: Neurosurgery;  Laterality: N/A;  Repair of CSF Leak requiring laminectomy  . PERCUTANEOUS CORONARY STENT INTERVENTION (PCI-S) N/A 07/15/2012   Procedure: PERCUTANEOUS CORONARY STENT INTERVENTION (PCI-S);  Surgeon: Jettie Booze, MD;  Location: [facility] CATH LAB;  Service: Cardiovascular;  Laterality: N/A;  . RHINOPLASTY  [id number]  . TEE WITHOUT  CARDIOVERSION N/A 04/28/2016   Procedure: TRANSESOPHAGEAL ECHOCARDIOGRAM (TEE);  Surgeon: Skeet Latch, MD;  Location: [facility] ENDOSCOPY;  Service: Cardiovascular;  Laterality: N/A;  . TONSILLECTOMY  1940  . TRANSURETHRAL RESECTION OF PROSTATE  x2   Social History: Social History  Substance Use Topics  . Smoking status: Never Smoker  . Smokeless tobacco: Never Used  . Alcohol use No   Additional social history: Lives at home with wife  Family History: Family History  Problem Relation Age of Onset  . Heart attack Mother   . Addison's disease Father   . Heart attack Sister   . Anesthesia problems Neg Hx   . Hypotension Neg Hx   . Malignant hyperthermia Neg Hx   . Pseudochol deficiency Neg Hx    Allergies and Medications: No Known Allergies No current facility-administered medications on file prior to encounter.    Current Outpatient Prescriptions on File Prior to Encounter  Medication Sig Dispense Refill  . acetaminophen (TYLENOL) 500 MG tablet Take 1,000 mg by mouth every 6 (six) hours as needed for fever.    [name] albuterol (PROVENTIL HFA;VENTOLIN HFA) 108 (90 Base) MCG/ACT inhaler Inhale 2 puffs into the lungs every 6 (six) hours as needed for wheezing or shortness of breath. 1 Inhaler 0  . Amino Acids-Protein Hydrolys (FEEDING SUPPLEMENT, PRO-STAT SUGAR FREE 64,) LIQD Take 30 mLs by mouth 2 (two) times daily. 900 mL 0  . aspirin EC 81 MG EC tablet Take 1 tablet (81 mg total) by mouth daily.    . budesonide-formoterol (SYMBICORT) 80-4.5 MCG/ACT inhaler Inhale 2 puffs into the lungs 2 (two) times daily.    . Ca Phosphate-Cholecalciferol (CALTRATE GUMMY BITES) 250-400 MG-UNIT CHEW Chew 2 tablets by mouth 2 (two) times daily.    . carbamazepine (TEGRETOL) 100 MG chewable tablet Chew 300 mg by mouth 2 (two) times daily.    [name] docusate sodium (COLACE) 100 MG capsule Take 100 mg by mouth daily.     . feeding supplement, ENSURE ENLIVE, (ENSURE ENLIVE) LIQD Take 237 mLs by mouth 2 (two)  times daily between meals. 237 mL 12  . finasteride (PROSCAR) 5 MG tablet Take 5 mg by mouth every morning.     [name] ipratropium (ATROVENT HFA) 17 MCG/ACT inhaler Inhale 2 puffs into the lungs 2 (two) times daily as needed for wheezing.     [name]  ipratropium-albuterol (DUONEB) 0.5-2.5 (3) MG/3ML SOLN Take 3 mLs by nebulization every 6 (six) hours as needed. (Patient taking differently: Take 3 mLs by nebulization every 6 (six) hours as needed (for SOB). ) 360 mL 2  . isosorbide mononitrate (IMDUR) 30 MG 24 hr tablet Take 1 tablet (30 mg total) by mouth daily. 30 tablet 0  . Melatonin 3 MG TABS Take 3 mg by mouth at bedtime.    . metoprolol succinate (TOPROL-XL) 12.5 mg TB24 24 hr tablet Take 12.5 mg by mouth daily.    . OXYGEN Inhale 3 L/min into the lungs continuous.    . potassium chloride SA (K-DUR,KLOR-CON) 20 MEQ tablet Take 20 mEq by mouth daily.     . ranitidine (ZANTAC) 150 MG tablet TAKE 1 TABLET BY MOUTH 2 TIMES DAILY. (Patient taking differently: Take 300 mg by mouth at bedtime) 180 tablet 3  . senna (SENOKOT) 8.6 MG tablet Take 1 tablet by mouth daily.     . sertraline (ZOLOFT) 100 MG tablet Take 150 mg by mouth every morning.     . tamsulosin (FLOMAX) 0.4 MG CAPS capsule Take 1 capsule (0.4 mg total) by mouth daily. 30 capsule 0  . traZODone (DESYREL) 50 MG tablet Take 1 tablet (50 mg total) by mouth at bedtime. 30 tablet 1  . ipratropium (ATROVENT) 0.02 % nebulizer solution Take 2.5 mLs (0.5 mg total) by nebulization 2 (two) times daily as needed for wheezing or shortness of breath. (Patient not taking: Reported on 06/02/2016) 75 mL 12  . nitroGLYCERIN (NITROSTAT) 0.4 MG SL tablet Place 1 tablet (0.4 mg total) under the tongue every 5 (five) minutes as needed for chest pain (As needed for chest pain). 25 tablet 3  . tiZANidine (ZANAFLEX) 2 MG tablet Take 1 tablet (2 mg total) by mouth every 8 (eight) hours as needed for muscle spasms. 12 tablet 0   Objective: BP (!) 155/72 (BP Location: Left  Arm)   Pulse 94   Temp 98.3 F (36.8 C) (Oral)   Resp (!) 21   Ht 5\' 10"  (1.778 m)   Wt 158 lb 9.6 oz (71.9 kg)   SpO2 96%   BMI 22.76 kg/m  Exam: General: pleasant, elderly white male laying in bed in NAD, nasal canula in place HEENT: NCAT, sclera are noninjected.  PERRL, dry mucous membranes, tongue appears erythematous and fissured, OP with white plaques Heart: RRR, no m/r/g, normal s1,s2. +murmur. No obvious gallops, or rubs noted.  Palpable radial pulses bilaterally. Pacemaker. Lungs: scattered expiratory wheezes appreciated, no rhonchi or rales noted.  Respiratory effort nonlabored, but does have [place] in place with 3L O2. Abd: soft, tender in RUQ and on the right side of his abdomen, ND, +BS, no masses, hernias, or organomegaly.  His biliary drain is in place with minimal biliary output currently.  Site is clean, dry, intact Neuro: grossly non-focal, CNs intact.  Psych: A&Ox3 with an appropriate affect.  Labs and Imaging: Results for orders placed or performed during the hospital encounter of 06/02/16 (from the past 24 hour(s))  Lipase, blood     Status: Abnormal   Collection Time: 06/02/16  5:04 PM  Result Value Ref Range   Lipase 137 (H) 11 - 51 U/L  Comprehensive metabolic panel     Status: Abnormal   Collection Time: 06/02/16  5:04 PM  Result Value Ref Range   Sodium 136 135 - 145 mmol/L   Potassium 4.2 3.5 - 5.1 mmol/L   Chloride 104 101 - 111  mmol/L   CO2 25 22 - 32 mmol/L   Glucose, Bld 116 (H) 65 - 99 mg/dL   BUN 19 6 - 20 mg/dL   Creatinine, Ser 0.79 0.61 - 1.24 mg/dL   Calcium 9.6 8.9 - 10.3 mg/dL   Total Protein 7.1 6.5 - 8.1 g/dL   Albumin 2.2 (L) 3.5 - 5.0 g/dL   AST 56 (H) 15 - 41 U/L   ALT 40 17 - 63 U/L   Alkaline Phosphatase 279 (H) 38 - 126 U/L   Total Bilirubin 0.4 0.3 - 1.2 mg/dL   GFR calc non Af Amer >60 >60 mL/min   GFR calc Af Amer >60 >60 mL/min   Anion gap 7 5 - 15  CBC     Status: Abnormal   Collection Time: 06/02/16  5:04 PM  Result Value  Ref Range   WBC 11.5 (H) 4.0 - 10.5 K/uL   RBC 3.25 (L) 4.22 - 5.81 MIL/uL   Hemoglobin 9.3 (L) 13.0 - 17.0 g/dL   HCT 29.8 (L) 39.0 - 52.0 %   MCV 91.7 78.0 - 100.0 fL   MCH 28.6 26.0 - 34.0 pg   MCHC 31.2 30.0 - 36.0 g/dL   RDW 15.0 11.5 - 15.5 %   Platelets 598 (H) 150 - 400 K/uL  Urinalysis, Routine w reflex microscopic     Status: Abnormal   Collection Time: 06/02/16  5:13 PM  Result Value Ref Range   Color, Urine YELLOW YELLOW   APPearance CLEAR CLEAR   Specific Gravity, Urine 1.034 (H) 1.005 - 1.030   pH 6.0 5.0 - 8.0   Glucose, UA NEGATIVE NEGATIVE mg/dL   Hgb urine dipstick NEGATIVE NEGATIVE   Bilirubin Urine NEGATIVE NEGATIVE   Ketones, ur NEGATIVE NEGATIVE mg/dL   Protein, ur NEGATIVE NEGATIVE mg/dL   Nitrite NEGATIVE NEGATIVE   Leukocytes, UA TRACE (A) NEGATIVE  Urine microscopic-add on     Status: Abnormal   Collection Time: 06/02/16  5:13 PM  Result Value Ref Range   Squamous Epithelial / LPF 0-5 (A) NONE SEEN   WBC, UA 0-5 0 - 5 WBC/hpf   RBC / HPF 0-5 0 - 5 RBC/hpf   Bacteria, UA FEW (A) NONE SEEN   Casts HYALINE CASTS (A) NEGATIVE   Crystals CA OXALATE CRYSTALS (A) NEGATIVE  I-Stat CG4 Lactic Acid, ED     Status: None   Collection Time: 06/02/16  5:17 PM  Result Value Ref Range   Lactic Acid, Venous 1.16 0.5 - 1.9 mmol/L  I-Stat CG4 Lactic Acid, ED     Status: None   Collection Time: 06/02/16  8:52 PM  Result Value Ref Range   Lactic Acid, Venous 1.23 0.5 - 1.9 mmol/L   Dg Chest 2 View  Result Date: 06/02/2016 CLINICAL DATA:  82 y/o M; shortness of breath with right lower chest pain beginning today. Recent stent placement for gallbladder. EXAM: CHEST  2 VIEW COMPARISON:  05/26/2016 chest radiograph. FINDINGS: Stable cardiomegaly. Aortic atherosclerosis with arch calcifications. Two lead pacemaker. Sternotomy wires are intact. Increasing right basilar opacification may represent worsening atelectasis or developing pneumonia. Small right pleural effusion.  IMPRESSION: Increasing right basilar opacification may represent worsening atelectasis or developing pneumonia. Small right pleural effusion. Electronically Signed   By: Kristine Garbe M.D.   On: 06/02/2016 21:00   Lovenia Kim, MD 06/03/2016, 12:19 AM PGY-1, [place] Intern pager: [phone], text pages welcome  FPTS Upper-Level Resident Addendum  I have independently interviewed  and examined the patient. I have discussed the above with the original author and agree with their documentation. My edits for correction/addition/clarification are in pink. Please see also any attending notes.   Katheren Shams, DO PGY-2, [place] Service pager: [phone] (text pages welcome through [facility])

## 2016-06-03 ENCOUNTER — Encounter (HOSPITAL_COMMUNITY): Payer: Self-pay | Admitting: Radiology

## 2016-06-03 ENCOUNTER — Telehealth: Payer: Self-pay

## 2016-06-03 ENCOUNTER — Other Ambulatory Visit: Payer: Self-pay

## 2016-06-03 ENCOUNTER — Inpatient Hospital Stay (HOSPITAL_COMMUNITY): Payer: Medicare Other

## 2016-06-03 DIAGNOSIS — K831 Obstruction of bile duct: Secondary | ICD-10-CM

## 2016-06-03 DIAGNOSIS — R5081 Fever presenting with conditions classified elsewhere: Secondary | ICD-10-CM

## 2016-06-03 DIAGNOSIS — F329 Major depressive disorder, single episode, unspecified: Secondary | ICD-10-CM

## 2016-06-03 DIAGNOSIS — I5020 Unspecified systolic (congestive) heart failure: Secondary | ICD-10-CM

## 2016-06-03 DIAGNOSIS — Z951 Presence of aortocoronary bypass graft: Secondary | ICD-10-CM

## 2016-06-03 DIAGNOSIS — Z950 Presence of cardiac pacemaker: Secondary | ICD-10-CM

## 2016-06-03 DIAGNOSIS — J449 Chronic obstructive pulmonary disease, unspecified: Secondary | ICD-10-CM

## 2016-06-03 LAB — BASIC METABOLIC PANEL
ANION GAP: 12 (ref 5–15)
BUN: 15 mg/dL (ref 6–20)
CALCIUM: 9.1 mg/dL (ref 8.9–10.3)
CHLORIDE: 102 mmol/L (ref 101–111)
CO2: 25 mmol/L (ref 22–32)
Creatinine, Ser: 0.66 mg/dL (ref 0.61–1.24)
GFR calc Af Amer: >60 mL/min (ref >60)
GFR calc non Af Amer: >60 mL/min (ref >60)
GLUCOSE: 112 mg/dL — AB (ref 65–99)
Potassium: 4 mmol/L (ref 3.5–5.1)
Sodium: 139 mmol/L (ref 135–145)

## 2016-06-03 LAB — CBC
HEMATOCRIT: 27.6 % — AB (ref 39.0–52.0)
HEMOGLOBIN: 8.5 g/dL — AB (ref 13.0–17.0)
MCH: 28.2 pg (ref 26.0–34.0)
MCHC: 30.8 g/dL (ref 30.0–36.0)
MCV: 91.7 fL (ref 78.0–100.0)
Platelets: 512 10*3/uL — ABNORMAL HIGH (ref 150–400)
RBC: 3.01 MIL/uL — ABNORMAL LOW (ref 4.22–5.81)
RDW: 15.1 % (ref 11.5–15.5)
WBC: 9 10*3/uL (ref 4.0–10.5)

## 2016-06-03 LAB — ALBUMIN: Albumin: 1.8 g/dL — ABNORMAL LOW (ref 3.5–5.0)

## 2016-06-03 LAB — STREP PNEUMONIAE URINARY ANTIGEN: Strep Pneumo Urinary Antigen: NEGATIVE

## 2016-06-03 LAB — D-DIMER, QUANTITATIVE: D-Dimer, Quant: 3.91 ug/mL-FEU — ABNORMAL HIGH (ref 0.00–0.50)

## 2016-06-03 MED ORDER — ORAL CARE MOUTH RINSE
15.0000 mL | Freq: Two times a day (BID) | OROMUCOSAL | Status: DC
  Administered 2016-06-03 – 2016-06-08 (×10): 15 mL via OROMUCOSAL

## 2016-06-03 MED ORDER — IPRATROPIUM-ALBUTEROL 0.5-2.5 (3) MG/3ML IN SOLN
3.0000 mL | Freq: Three times a day (TID) | RESPIRATORY_TRACT | Status: DC
  Administered 2016-06-03 – 2016-06-08 (×15): 3 mL via RESPIRATORY_TRACT
  Filled 2016-06-03 (×15): qty 3

## 2016-06-03 MED ORDER — SODIUM CHLORIDE 0.9 % IV SOLN
INTRAVENOUS | Status: DC
  Administered 2016-06-03: 22:00:00 via INTRAVENOUS
  Administered 2016-06-03: 1000 mL via INTRAVENOUS

## 2016-06-03 MED ORDER — ENOXAPARIN SODIUM 40 MG/0.4ML ~~LOC~~ SOLN
40.0000 mg | Freq: Every day | SUBCUTANEOUS | Status: DC
  Administered 2016-06-03 – 2016-06-07 (×6): 40 mg via SUBCUTANEOUS
  Filled 2016-06-03 (×6): qty 0.4

## 2016-06-03 MED ORDER — ALBUTEROL SULFATE (2.5 MG/3ML) 0.083% IN NEBU: 2.5000 mg | INHALATION_SOLUTION | RESPIRATORY_TRACT | Status: DC | PRN

## 2016-06-03 MED ORDER — ENSURE ENLIVE PO LIQD
237.0000 mL | Freq: Two times a day (BID) | ORAL | Status: DC
  Administered 2016-06-03 – 2016-06-08 (×8): 237 mL via ORAL

## 2016-06-03 MED ORDER — TAMSULOSIN HCL 0.4 MG PO CAPS
0.4000 mg | ORAL_CAPSULE | Freq: Every day | ORAL | Status: DC
  Administered 2016-06-03 – 2016-06-08 (×6): 0.4 mg via ORAL
  Filled 2016-06-03 (×6): qty 1

## 2016-06-03 MED ORDER — CARBAMAZEPINE 100 MG PO CHEW
300.0000 mg | CHEWABLE_TABLET | Freq: Two times a day (BID) | ORAL | Status: DC
  Administered 2016-06-03 – 2016-06-08 (×12): 300 mg via ORAL
  Filled 2016-06-03 (×14): qty 3

## 2016-06-03 MED ORDER — ASPIRIN EC 81 MG PO TBEC
81.0000 mg | DELAYED_RELEASE_TABLET | Freq: Every day | ORAL | Status: DC
  Administered 2016-06-03 – 2016-06-08 (×6): 81 mg via ORAL
  Filled 2016-06-03 (×6): qty 1

## 2016-06-03 MED ORDER — IPRATROPIUM-ALBUTEROL 0.5-2.5 (3) MG/3ML IN SOLN
3.0000 mL | RESPIRATORY_TRACT | Status: DC | PRN
  Administered 2016-06-03 (×2): 3 mL via RESPIRATORY_TRACT
  Filled 2016-06-03 (×2): qty 3

## 2016-06-03 MED ORDER — TRAZODONE HCL 50 MG PO TABS
50.0000 mg | ORAL_TABLET | Freq: Every day | ORAL | Status: DC
  Administered 2016-06-03 – 2016-06-07 (×6): 50 mg via ORAL
  Filled 2016-06-03 (×6): qty 1

## 2016-06-03 MED ORDER — FINASTERIDE 5 MG PO TABS
5.0000 mg | ORAL_TABLET | Freq: Every morning | ORAL | Status: DC
  Administered 2016-06-03 – 2016-06-08 (×6): 5 mg via ORAL
  Filled 2016-06-03 (×6): qty 1

## 2016-06-03 MED ORDER — MORPHINE SULFATE (PF) 2 MG/ML IV SOLN
2.0000 mg | INTRAVENOUS | Status: DC | PRN
  Administered 2016-06-03 – 2016-06-04 (×5): 2 mg via INTRAVENOUS
  Filled 2016-06-03 (×5): qty 1

## 2016-06-03 MED ORDER — IOPAMIDOL (ISOVUE-370) INJECTION 76%
INTRAVENOUS | Status: AC
  Administered 2016-06-03: 80 mL
  Filled 2016-06-03: qty 100

## 2016-06-03 MED ORDER — ISOSORBIDE MONONITRATE ER 30 MG PO TB24
30.0000 mg | ORAL_TABLET | Freq: Every day | ORAL | Status: DC
  Administered 2016-06-03 – 2016-06-08 (×6): 30 mg via ORAL
  Filled 2016-06-03 (×6): qty 1

## 2016-06-03 MED ORDER — SERTRALINE HCL 50 MG PO TABS
150.0000 mg | ORAL_TABLET | Freq: Every morning | ORAL | Status: DC
  Administered 2016-06-03 – 2016-06-08 (×6): 150 mg via ORAL
  Filled 2016-06-03 (×6): qty 1

## 2016-06-03 MED ORDER — MOMETASONE FURO-FORMOTEROL FUM 100-5 MCG/ACT IN AERO
2.0000 | INHALATION_SPRAY | Freq: Two times a day (BID) | RESPIRATORY_TRACT | Status: DC
  Administered 2016-06-03 – 2016-06-08 (×11): 2 via RESPIRATORY_TRACT
  Filled 2016-06-03 (×2): qty 8.8

## 2016-06-03 MED ORDER — METOPROLOL SUCCINATE ER 25 MG PO TB24
12.5000 mg | ORAL_TABLET | Freq: Every day | ORAL | Status: DC
  Administered 2016-06-03 – 2016-06-08 (×6): 12.5 mg via ORAL
  Filled 2016-06-03 (×6): qty 1

## 2016-06-03 MED ORDER — MELATONIN 3 MG PO TABS
3.0000 mg | ORAL_TABLET | Freq: Every day | ORAL | Status: DC
  Administered 2016-06-03 – 2016-06-07 (×6): 3 mg via ORAL
  Filled 2016-06-03 (×7): qty 1

## 2016-06-03 MED ORDER — NITROGLYCERIN 0.4 MG SL SUBL: 0.4000 mg | SUBLINGUAL_TABLET | SUBLINGUAL | Status: DC | PRN

## 2016-06-03 NOTE — Progress Notes (Signed)
Initial Nutrition Assessment  DOCUMENTATION CODES:   Not applicable  INTERVENTION:  Continue Ensure Enlive po BID, each supplement provides 350 kcal and 20 grams of protein.  Encourage adequate PO intake.   NUTRITION DIAGNOSIS:   Increased nutrient needs related to chronic illness as evidenced by estimated needs.  GOAL:   Patient will meet greater than or equal to 90% of their needs  MONITOR:   PO intake, Supplement acceptance, Labs, Weight trends, Skin, I & O's  REASON FOR ASSESSMENT:   Malnutrition Screening Tool    ASSESSMENT:   82 y.o. male presenting with fever, shortness of breath. PMH is significant for essential hypertension, CAD, hernia, psoriais, OA, ischemic heart disease, MI, CHF, asthma, dyslipidemia, hx of CABG, depression and GERD.   RD attempted to visit patient multiple times. Pt unavailable. RD unable to obtain nutrition history. Weight has been stable per weight records. Meal completion has been 50%. Pt currently has Ensure ordered and has been consuming them. RD to continue with current orders. Unable to complete Nutrition-Focused physical exam at this time. RD to perform at next visit.   Labs and medications reviewed.   Diet Order:  Diet Heart Room service appropriate? Yes; Fluid consistency: Thin  Skin:   (Incision on abdomen)  Last BM:  10/4  Height:   Ht Readings from Last 1 Encounters:  06/03/16 5\' 10"  (1.778 m)    Weight:   Wt Readings from Last 1 Encounters:  06/03/16 158 lb 9.6 oz (71.9 kg)    Ideal Body Weight:  75.45 kg  BMI:  Body mass index is 22.76 kg/m.  Estimated Nutritional Needs:   Kcal:  1700-1900  Protein:  75-90 grams  Fluid:  1.7 - 1.9 L/day  EDUCATION NEEDS:   No education needs identified at this time  Corrin Parker, MS, RD, LDN Pager # [phone] After hours/ weekend pager # [phone]

## 2016-06-03 NOTE — Progress Notes (Signed)
Family Medicine Teaching Service Daily Progress Note Intern Pager: [phone]  Patient name: [name] Medical record number: [id number] Date of birth: November 26, 1924 Age: 82 y.o. Gender: male  Primary Care Provider: Bufford Lope, DO Consultants: None Code Status: Full code  Pt Overview and Major Events to Date:  1. Admitted to FMTS under attending, [name] 2. CTA found large loculated R pleural effusion, plan to have thoracentesis  Assessment and Plan: [name] is a 82 y.o. male presenting with fever, shortness of breath. PMH is significant for essential hypertension, CAD, hernia, psoriais, OA, ischemic heart disease, MI, CHF, asthma, dyslipidemia, hx of CABG, depression and GERD.   HCAP.  Concern for HCAP given fevers, shortness of breath, cough, worsening o2 requirement (baseline 3L). Likely cause of recent fevers (patient reports spiking fevers at home every 6-8 hours following surgical procedure). Patient had recent stent placement in gallbladder.  Ordered d-dimer to rule out PE, this was elevated and had CTA that showed no embolus, but did have large loculated pleural effusion on right side.  -continue Vanc/Zosyn per pharm consult -Bcx, Ucx, sputum culture -Strep pneumo urinary antigen -EKG ordered, follow up -cardiac monitoring, continuous pulse ox -vitals per unit routine -monitor respiratory status; O2 as needed to maintain sat >90% -trend fevers -PT/OT eval  -gentle IVF hydration @100mL /hr  Abdominal pain Multifactorial. Could be due to recent surgery (see below) vs pancreatitis. Elevated lipase on admission was 137. Patient with poor PO intake. No known n/v.  Does have tenderness in RUQ without rebounding.  Discussed finding with [facility] and they will hold off on seeing patient for now.   -continue to monitor -IV hydration  Anemia On admission Hbg 9.3, baseline appears to be ~9.0 now but last year was 11. No known bleeding currently. Recent surgeries could be  cause of anemia. Asymptomatic. 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 Metal stent placement and removal of his internal/external biliary drain. GI follwoed up with patient today and they do not believe biliary source as the cause of his fevers. LFTs slighly elevated on admission. Could be cause of abdominal pain.  Does have RUQ pain without rebounding. Reported fevers  fevers.  -monitor LFTS intermittently -hold off on cipro and flagyl; on vanc/zoysn IV for HCAP which covers same pathogens   HTN BP normal on admission. Stable.  -continue home regimen of imdur, metoprolol -monitor  Chronic systolic HF Last Echo on 123456 showing normal EF and diastolic dysfunction. Prior echos have showed G1DD with pEF.  Last followed with cardiology in August. -give IVF now considering poor PO intake and dehydration; consider stopping after 12 hrs -not on diuretic at baseline -continue home metoprolol and imdur  Hx of CABG with CAD. HLD He had CABG in 1986. He had a redo CABG in 1993. He had PCI in 2008 and 2010. 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 Stable.  -Continue daily aspirin 81 mg -Nitroglycerin sublingual if needed for chest pain -not on statin therapy  Asthma Worsened in setting of pneumonia with wheezing on exam.  -Continue Dulera -prn breathing treatments  Depression Stable -continue home Zoloft 150 mg daily  BPH -continue Flomax 0.4 mg daily and finasteride -monitor UOP  Insomnia -Continue home Melatonin 3 mg -Continue home Trazodone 50 mg daily  A. Flutter S/p pacemaker in 2016. Not on anticoagulation given  history of falls and syncope.  -Continue beta blocker.  FEN/GI: Diet heart, IV NS @100  cc/hr, Ensure supplement bid between meals Prophylaxis:  Lovenox   Disposition: pending medical improvement  Subjective:  Patient was receiving duoneb treatments when I examined him this morning and stated that he thinks he's doing worse.  He now has pain in his RUQ that was not there when he had his follow up with interventional radiology.  He is also having a more difficult time breathing and feels "tight".    Objective: Temp:  [97.8 F (36.6 C)-98.8 F (37.1 C)] 98.6 F (37 C) (10/05 0900) Pulse Rate:  [71-94] 73 (10/05 0900) Resp:  [18-27] 18 (10/05 0900) BP: (122-155)/(59-80) 129/61 (10/05 0900) SpO2:  [91 %-100 %] 99 % (10/05 0900) Weight:  [157 lb 12.8 oz (71.6 kg)-159 lb (72.1 kg)] 158 lb 9.6 oz (71.9 kg) (10/05 0015) Physical Exam: General: pleasant, elderlywhite male laying in bed in NAD, nasal canula in place HEENT: NCAT,sclera are noninjected. PERRL, dry mucous membranes, tongue appears erythematous and fissured, OP with white plaques Heart: RRR, no m/r/g, normal s1,s2. +murmur.No obvious gallops, or rubs noted. Palpable radial pulses bilaterally. Pacemaker. Lungs: scattered expiratory wheezes appreciated, norhonchi or rales noted. Respiratory effort nonlabored, but does have [place] in place with 4L O2. Abd: soft, tender in RUQ and on the right side of his abdomen, ND, +BS, no masses, hernias, or organomegaly. His biliary drain is in place with minimal biliary output currently. Site is clean, dry, intact Neuro: grossly non-focal, CNs intact.  Psych: A&Ox3 with an appropriate affect.  Laboratory:  Recent Labs Lab 06/01/16 0823 06/02/16 1704 06/03/16 0540  WBC 11.0* 11.5* 9.0  HGB 8.9* 9.3* 8.5*  HCT 28.5* 29.8* 27.6*  PLT 532* 598* 512*    Recent Labs Lab 06/01/16 0823 06/02/16 1704 06/03/16 0540  NA 134* 136 139  K 4.1 4.2 4.0  CL 102 104 102  CO2 26 25 25   BUN 19 19 15   CREATININE 0.82 0.79 0.66  CALCIUM 9.4 9.6 9.1  PROT 6.9 7.1  --   BILITOT 0.5 0.4  --   ALKPHOS 261* 279*  --   ALT 38 40  --    AST 52* 56*  --   GLUCOSE 115* 116* 112*     Imaging/Diagnostic Tests: Dg Chest 2 View  Result Date: 06/02/2016 CLINICAL DATA:  82 y/o M; shortness of breath with right lower chest pain beginning today. Recent stent placement for gallbladder. EXAM: CHEST  2 VIEW COMPARISON:  05/26/2016 chest radiograph. FINDINGS: Stable cardiomegaly. Aortic atherosclerosis with arch calcifications. Two lead pacemaker. Sternotomy wires are intact. Increasing right basilar opacification may represent worsening atelectasis or developing pneumonia. Small right pleural effusion. IMPRESSION: Increasing right basilar opacification may represent worsening atelectasis or developing pneumonia. Small right pleural effusion. Electronically Signed   By: Kristine Garbe M.D.   On: 06/02/2016 21:00   Ct Angio Chest Pe W Or Wo Contrast  Result Date: 06/03/2016 CLINICAL DATA:  Chest pain, shortness of breath. EXAM: CT ANGIOGRAPHY CHEST WITH CONTRAST TECHNIQUE: Multidetector CT imaging of the chest was performed using the standard protocol during bolus administration of intravenous contrast. Multiplanar CT image reconstructions and MIPs were obtained to evaluate the vascular anatomy. CONTRAST:  80 mL of Isovue 370 intravenously. COMPARISON:  Radiographs of June 02, 2016. FINDINGS: Cardiovascular: Atherosclerosis of thoracic aorta is noted without aneurysm. Status post coronary artery bypass graft. There is no definite evidence of pulmonary embolus. Mediastinum/Nodes: No mediastinal mass or adenopathy  is noted. Lungs/Pleura: No pneumothorax is noted. Mild left posterior basilar subsegmental atelectasis is noted. Large loculated right pleural effusion is noted. Upper Abdomen: Left hepatic pneumobilia is noted. Biliary stent is noted in common bile duct. Musculoskeletal: No significant osseous abnormality is noted. Review of the MIP images confirms the above findings. IMPRESSION: Aortic atherosclerosis. No evidence of pulmonary  embolus. Large loculated right pleural effusion is noted. Electronically Signed   By: Marijo Conception, M.D.   On: 06/03/2016 12:26    Eloise Levels, MD 06/03/2016, 9:55 AM PGY-1, [place] Intern pager: [phone], text pages welcome

## 2016-06-03 NOTE — Telephone Encounter (Signed)
Pt has been scheduled for labs and office visit for 07/19/16 2:45 pm.  Letter mailed with information to the pt.

## 2016-06-03 NOTE — Telephone Encounter (Signed)
-----   Message from Milus Banister, MD sent at 06/02/2016  5:44 PM EDT -----   ----- Message ----- From: Loralie Champagne, PA-C Sent: 06/02/2016   2:41 PM To: Milus Banister, MD

## 2016-06-03 NOTE — Progress Notes (Signed)
PT Cancellation Note  Patient Details Name: [name] MRN: [id number] DOB: 1925-03-29   Cancelled Treatment:    Reason Eval/Treat Not Completed: Patient's level of consciousness;Fatigue/lethargy limiting ability to participate. Pt refusing to participate in PT evaluation at this time, stating that he was too fatigued and lethargic from his medications to be able to participate. PT will continue to f/u with pt as appropriate.   [name] 06/03/2016, [place], [place], DPT [phone]

## 2016-06-04 ENCOUNTER — Inpatient Hospital Stay (HOSPITAL_COMMUNITY): Payer: Medicare Other

## 2016-06-04 DIAGNOSIS — J90 Pleural effusion, not elsewhere classified: Secondary | ICD-10-CM

## 2016-06-04 LAB — COMPREHENSIVE METABOLIC PANEL
ALK PHOS: 215 U/L — AB (ref 38–126)
ALT: 30 U/L (ref 17–63)
AST: 40 U/L (ref 15–41)
Albumin: 1.7 g/dL — ABNORMAL LOW (ref 3.5–5.0)
Anion gap: 8 (ref 5–15)
BUN: 11 mg/dL (ref 6–20)
CALCIUM: 9 mg/dL (ref 8.9–10.3)
CHLORIDE: 102 mmol/L (ref 101–111)
CO2: 27 mmol/L (ref 22–32)
Creatinine, Ser: 0.75 mg/dL (ref 0.61–1.24)
GFR calc Af Amer: >60 mL/min (ref >60)
Glucose, Bld: 117 mg/dL — ABNORMAL HIGH (ref 65–99)
Potassium: 3.9 mmol/L (ref 3.5–5.1)
SODIUM: 137 mmol/L (ref 135–145)
TOTAL PROTEIN: 5.9 g/dL — AB (ref 6.5–8.1)
Total Bilirubin: 0.5 mg/dL (ref 0.3–1.2)

## 2016-06-04 LAB — BODY FLUID CELL COUNT WITH DIFFERENTIAL
EOS FL: 0 %
LYMPHS FL: 10 %
MONOCYTE-MACROPHAGE-SEROUS FLUID: 20 % — AB (ref 50–90)
Neutrophil Count, Fluid: 70 % — ABNORMAL HIGH (ref 0–25)
Total Nucleated Cell Count, Fluid: 1239 cu mm — ABNORMAL HIGH (ref 0–1000)

## 2016-06-04 LAB — URINE CULTURE: Culture: NO GROWTH

## 2016-06-04 LAB — LACTATE DEHYDROGENASE, PLEURAL OR PERITONEAL FLUID: LD FL: 178 U/L — AB (ref 3–23)

## 2016-06-04 LAB — GRAM STAIN

## 2016-06-04 LAB — PROTEIN, BODY FLUID: TOTAL PROTEIN, FLUID: 3.4 g/dL

## 2016-06-04 LAB — GLUCOSE, SEROUS FLUID: GLUCOSE FL: 114 mg/dL

## 2016-06-04 MED ORDER — LIDOCAINE HCL (PF) 1 % IJ SOLN
INTRAMUSCULAR | Status: AC
  Filled 2016-06-04: qty 10

## 2016-06-04 MED ORDER — PREDNISONE 50 MG PO TABS
50.0000 mg | ORAL_TABLET | Freq: Every day | ORAL | Status: DC
  Administered 2016-06-05 – 2016-06-08 (×4): 50 mg via ORAL
  Filled 2016-06-04 (×4): qty 1

## 2016-06-04 NOTE — Progress Notes (Signed)
Family Medicine Teaching Service Daily Progress Note Intern Pager: [phone]  Patient name: [name] Medical record number: [id number] Date of birth: 09-18-1924 Age: 82 y.o. Gender: male  Primary Care Provider: Bufford Lope, DO Consultants: None Code Status: Full code  Pt Overview and Major Events to Date:  1. Admitted to FMTS under attending, [name] 2. CTA found large loculated R pleural effusion, plan to have thoracentesis  Assessment and Plan: [name] is a 82 y.o. male presenting with fever, shortness of breath. PMH is significant for essential hypertension, CAD, hernia, psoriais, OA, ischemic heart disease, MI, CHF, asthma, dyslipidemia, hx of CABG, depression and GERD.   HCAP.  Concern for HCAP given fevers, shortness of breath, cough, worsening o2 requirement (baseline 3L). Likely cause of recent fevers (patient reports spiking fevers at home every 6-8 hours following surgical procedure). Patient had recent stent placement in gallbladder.  Ordered d-dimer to rule out PE, this was elevated and had CTA that showed no embolus, but did have large loculated pleural effusion on right side.  PT was unable to evaluate yesterday d/t patient being lethargic.  Does have some pleuritic chest pain on R side this morning with inspiration. VSS.  -continue Vanc/Zosyn per pharm consult - thoracentesis (diagnostic and therapeutic) this AM -Bcx, Ucx, sputum culture -EKG ordered, follow up -cardiac monitoring, continuous pulse ox -vitals per unit routine -monitor respiratory status; O2 as needed to maintain sat >90% -trend fevers -OT eval pending -gentle IVF hydration @100mL /hr - 50mg  prednisone 5 day burst  Abdominal pain Multifactorial. Could be due to recent surgery (see below) vs pancreatitis. Elevated lipase on admission was 137. Patient with poor PO intake. No known n/v.  Does have tenderness in RUQ without rebounding.  Discussed finding with [facility] and they will hold off on  seeing patient for now.   -continue to monitor -IV hydration  Anemia On admission Hbg 9.3, baseline appears to be ~9.0 now but last year was 11. No known bleeding currently. Recent surgeries could be cause of anemia. Asymptomatic. 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 Metal stent placement and removal of his internal/external biliary drain. GI follwoed up with patient today and they do not believe biliary source as the cause of his fevers. LFTs slighly elevated on admission. Could be cause of abdominal pain.  Does have RUQ pain without rebounding. Continued to be afebrile overnight.  -monitor LFTS intermittently -hold off on cipro and flagyl; on vanc/zoysn IV for HCAP which covers same pathogens   HTN BP normal on admission. Stable.  -continue home regimen of imdur, metoprolol -monitor  Chronic systolic HF Last Echo on 123456 showing normal EF and diastolic dysfunction. Prior echos have showed G1DD with pEF.  Last followed with cardiology in August. -give IVF now considering poor PO intake and dehydration; consider stopping after 12 hrs -not on diuretic at baseline -continue home metoprolol and imdur  Hx of CABG with CAD. HLD He had CABG in 1986. He had a redo CABG in 1993. He had PCI in 2008 and 2010. 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 Stable.  -Continue daily aspirin 81 mg -Nitroglycerin sublingual if needed for chest pain -not on statin therapy  Asthma Worsened in setting of pneumonia with wheezing on exam.  -Continue Dulera -scheduled duonebs 3x daily - start 50mg  prednisone 5 day burst  Depression  -continue home Zoloft 150 mg daily  BPH -continue Flomax 0.4 mg daily and finasteride -monitor UOP  Insomnia -Continue home Melatonin 3 mg -Continue home Trazodone  50 mg daily  A. Flutter S/p pacemaker in 2016. Not on anticoagulation given history of falls and syncope.  -Continue beta blocker.  FEN/GI: Diet heart, IV NS @100  cc/hr, Ensure supplement bid between meals Prophylaxis: Lovenox   Disposition: pending medical improvement  Subjective:  Feels more tired than yesterday.  Was lying down and just finished breathing treatment.  Complained of right sided CP and had tenderness in the R intercostals with inspiration.  Objective: Temp:  [98.6 F (37 C)-99.6 F (37.6 C)] 98.6 F (37 C) (10/06 1000) Pulse Rate:  [74-87] 84 (10/06 1000) Resp:  [18-20] 20 (10/06 1000) BP: (106-142)/(53-66) 108/53 (10/06 1223) SpO2:  [88 %-97 %] 92 % (10/06 1114) Physical Exam: General: pleasant, elderlywhite male laying in bed uncomfortable, nasal canula in place Heart: RRR, no m/r/g, normal s1,s2. +murmur.No obvious gallops, or rubs noted. Palpable radial pulses bilaterally. Pacemaker. Lungs: increased WOB, scattered expiratory wheezes throughout, decreased air movement R>L with bibasilar crackles. [place] in place with 4L O2. Abd: soft, tender in RUQ and on the right side of his abdomen MSK:  Tenderness to palpation R intercostals Psych: A&Ox3 with an appropriate affect.  Laboratory:  Recent Labs Lab 06/01/16 0823 06/02/16 1704 06/03/16 0540  WBC 11.0* 11.5* 9.0  HGB 8.9* 9.3* 8.5*  HCT 28.5* 29.8* 27.6*  PLT 532* 598* 512*    Recent Labs Lab 06/01/16 0823 06/02/16 1704 06/03/16 0540 06/04/16 0445  NA 134* 136 139 137  K 4.1 4.2 4.0 3.9  CL 102 104 102 102  CO2 26 25 25 27   BUN 19 19 15 11   CREATININE 0.82 0.79 0.66 0.75  CALCIUM 9.4 9.6 9.1 9.0  PROT 6.9 7.1  --  5.9*  BILITOT 0.5 0.4  --  0.5  ALKPHOS 261* 279*  --  215*  ALT 38 40  --  30  AST 52* 56*  --  40  GLUCOSE 115* 116* 112* 117*   Strep pneumo urinary antigen= negative  Imaging/Diagnostic Tests: Dg Chest 1 View  Result Date: 06/04/2016 CLINICAL DATA:  Right-sided  thoracentesis. EXAM: CHEST 1 VIEW COMPARISON:  None. FINDINGS: Small right pleural effusion. No left pleural effusion. Bilateral mild interstitial thickening. No pneumothorax. Stable cardiomegaly. Prior CABG. Dual lead AICD. No acute osseous abnormality. IMPRESSION: 1. Small right pleural effusion. No pneumothorax status post right thoracentesis. Electronically Signed   By: Kathreen Devoid   On: 06/04/2016 12:46   Dg Chest 2 View  Result Date: 06/02/2016 CLINICAL DATA:  82 y/o M; shortness of breath with right lower chest pain beginning today. Recent stent placement for gallbladder. EXAM: CHEST  2 VIEW COMPARISON:  05/26/2016 chest radiograph. FINDINGS: Stable cardiomegaly. Aortic atherosclerosis with arch calcifications. Two lead pacemaker. Sternotomy wires are intact. Increasing right basilar opacification may represent worsening atelectasis or developing pneumonia. Small right pleural effusion. IMPRESSION: Increasing right basilar opacification may represent worsening atelectasis or developing pneumonia. Small right pleural effusion. Electronically Signed   By: Kristine Garbe M.D.   On: 06/02/2016 21:00   Ct Angio Chest Pe W Or Wo Contrast  Result Date: 06/03/2016 CLINICAL DATA:  Chest pain, shortness of breath. EXAM: CT ANGIOGRAPHY CHEST WITH CONTRAST TECHNIQUE: Multidetector CT imaging of the chest was performed using the standard protocol during bolus administration of intravenous contrast. Multiplanar CT image reconstructions and MIPs were obtained to  evaluate the vascular anatomy. CONTRAST:  80 mL of Isovue 370 intravenously. COMPARISON:  Radiographs of June 02, 2016. FINDINGS: Cardiovascular: Atherosclerosis of thoracic aorta is noted without aneurysm. Status post coronary artery bypass graft. There is no definite evidence of pulmonary embolus. Mediastinum/Nodes: No mediastinal mass or adenopathy is noted. Lungs/Pleura: No pneumothorax is noted. Mild left posterior basilar subsegmental  atelectasis is noted. Large loculated right pleural effusion is noted. Upper Abdomen: Left hepatic pneumobilia is noted. Biliary stent is noted in common bile duct. Musculoskeletal: No significant osseous abnormality is noted. Review of the MIP images confirms the above findings. IMPRESSION: Aortic atherosclerosis. No evidence of pulmonary embolus. Large loculated right pleural effusion is noted. Electronically Signed   By: Marijo Conception, M.D.   On: 06/03/2016 12:26   US Thoracentesis Asp Pleural Space W/img Guide  Result Date: 06/04/2016 INDICATION: Pneumonia. History of chronic systolic heart failure. History of coronary artery disease. Right-sided pleural effusion. Request diagnostic and therapeutic thoracentesis. EXAM: ULTRASOUND GUIDED RIGHT THORACENTESIS MEDICATIONS: None. COMPLICATIONS: None immediate. PROCEDURE: An ultrasound guided thoracentesis was thoroughly discussed with the patient and questions answered. The benefits, risks, alternatives and complications were also discussed. The patient understands and wishes to proceed with the procedure. Written consent was obtained. Ultrasound was performed to localize and mark an adequate pocket of fluid in the right chest. The area was then prepped and draped in the normal sterile fashion. 1% Lidocaine was used for local anesthesia. Under ultrasound guidance a Safe-T-Centesis catheter was introduced. Thoracentesis was performed. The catheter was removed and a dressing applied. FINDINGS: A total of approximately 1.2 L of hazy, amber colored fluid was removed. Samples were sent to the laboratory as requested by the clinical team. IMPRESSION: Successful ultrasound guided right thoracentesis yielding 1.2 L of pleural fluid. Read by: Ascencion Dike PA-C Electronically Signed   By: Aletta Edouard M.D.   On: 06/04/2016 12:30    Eloise Levels, MD 06/04/2016, 1:23 PM PGY-1, [place] Intern pager: [phone], text pages welcome

## 2016-06-04 NOTE — Evaluation (Signed)
Physical Therapy Evaluation Patient Details Name: [name] MRN: [id number] DOB: 1925-05-28 Today's Date: 06/04/2016   History of Present Illness  Shane A Weisneris a 82 y.o.malepresenting with fever, shortness of breath.  Patient with Rt pleural effusion and s/p thoracentesis 06/04/16.     PMH is significant for essential hypertension, CAD, hernia, psoriais, OA, ischemic heart disease, MI, CHF, asthma, dyslipidemia, hx of CABG, depression and GERD.     Clinical Impression  Patient presents with problems listed below.  Will benefit from acute PT to maximize functional mobility prior to discharge.  Patient will need to function at Mod I - Supervision level to return home safely with wife.  If unable to reach this functional level, may need to consider SNF at d/c.      Follow Up Recommendations Supervision for mobility/OOB;Home health PT  (SNF if unable to reach Mod I to supervision level)    Equipment Recommendations  3in1 (PT)    Recommendations for Other Services       Precautions / Restrictions Precautions Precautions: Fall Precaution Comments: On O2 at 4L Restrictions Weight Bearing Restrictions: No      Mobility  Bed Mobility Overal bed mobility: Needs Assistance Bed Mobility: Supine to Sit;Sit to Supine     Supine to sit: Min assist Sit to supine: Mod assist   General bed mobility comments: Assist to bring trunk to upright position, and to scoot out to EOB.  Patient with fair sitting balance once upright.  Noted wheezing sound with breathing.  Patient with increased WOB in sitting, with O2 on at 4L.  Sat EOB x 10 minutes.  Assist to bring LE's onto bed to return to supine, and to position in bed.  Transfers                 General transfer comment: NT  Ambulation/Gait                Stairs            Wheelchair Mobility    Modified Rankin (Stroke Patients Only)       Balance Overall balance assessment: Needs  assistance Sitting-balance support: No upper extremity supported;Feet supported Sitting balance-Leahy Scale: Fair                                       Pertinent Vitals/Pain Pain Assessment: Faces Faces Pain Scale: Hurts whole lot Pain Location: both sides (flank) Pain Descriptors / Indicators: Sore Pain Intervention(s): Limited activity within patient's tolerance;Monitored during session;Repositioned    Home Living Family/patient expects to be discharged to:: Private residence Living Arrangements: Spouse/significant other Available Help at Discharge: Family;Available 24 hours/day Type of Home: House Home Access: Stairs to enter Entrance Stairs-Rails: Left Entrance Stairs-Number of Steps: 2 Home Layout: One level Home Equipment: Walker - 2 wheels;Grab bars - tub/shower;Shower seat;Walker - 4 wheels      Prior Function Level of Independence: Independent with assistive device(s)         Comments: uses RW in home, rollator when out     Hand Dominance        Extremity/Trunk Assessment   Upper Extremity Assessment: Defer to OT evaluation           Lower Extremity Assessment: Generalized weakness;RLE deficits/detail RLE Deficits / Details: Decreased DF on Rt - drop foot per patient.  Has brace, but rarely wears it.  Communication   Communication: No difficulties  Cognition Arousal/Alertness: Awake/alert Behavior During Therapy: WFL for tasks assessed/performed Overall Cognitive Status: Within Functional Limits for tasks assessed                      General Comments      Exercises     Assessment/Plan    PT Assessment Patient needs continued PT services  PT Problem List Decreased strength;Decreased activity tolerance;Decreased balance;Decreased mobility;Decreased knowledge of use of DME;Cardiopulmonary status limiting activity;Impaired sensation;Pain          PT Treatment Interventions DME instruction;Gait  training;Functional mobility training;Therapeutic activities;Stair training;Therapeutic exercise;Patient/family education    PT Goals (Current goals can be found in the Care Plan section)  Acute Rehab PT Goals Patient Stated Goal: breathe better PT Goal Formulation: With patient Time For Goal Achievement: 06/11/16 Potential to Achieve Goals: Good    Frequency Min 3X/week   Barriers to discharge   Wife is 16 yo.  Unsure of how much assist she can provide patient.    Co-evaluation               End of Session Equipment Utilized During Treatment: Oxygen Activity Tolerance: Patient limited by fatigue;Patient limited by pain (Limited by dyspnea) Patient left: in bed;with call bell/phone within reach;with bed alarm set           Time: [id number] PT Time Calculation (min) (ACUTE ONLY): 17 min   Charges:   PT Evaluation $PT Eval Moderate Complexity: 1 Procedure     PT G CodesDespina Pole 2016-06-30, 6:39 PM Carita Pian. Sanjuana Kava, [place] Pager [phone]

## 2016-06-04 NOTE — Care Management Note (Signed)
Case Management Note  Patient Details  Name: [name] MRN: [id number] Date of Birth: 20-Sep-1924  Subjective/Objective:     HCAP, Abdominal Pain, Anemia, HF            Action/Plan: Discharge Planning:   NCM spoke to son, [name] POA # [id number]. Son states pt is active with [facility] for [facility] RN/PT. Contacted [facility] to make aware of admission. Pt has oxygen at home. Son states pt's concentrator made be malfunctioning and that he was out of portable tanks. He contacted [facility] in regards to oxygen. Contacted [facility] DME rep to make aware to follow up on home oxygen. Pt has RW and neb machine at home. Son states pt may need wheelchair for home. Will need resumption of care orders for [facility] RN/PT.  Gweneth Fritter MD      Expected Discharge Date:                Expected Discharge Plan:  [place]  In-House Referral:  NA  Discharge planning Services  CM Consult  Post Acute Care Choice:  Home Health Choice offered to:  Adult Children  DME Arranged:    DME Agency:     HH Arranged:  RN, PT [place] Agency:  [place]  Status of Service:  In process, will continue to follow  If discussed at Long Length of Stay Meetings, dates discussed:    Additional Comments:  Erenest Rasher, RN 06/04/2016, 4:22 PM

## 2016-06-04 NOTE — Progress Notes (Signed)
[facility]  Patient Status: Active (receiving services up to time of hospitalization)  [facility] is providing the following services: RN and PT  If patient discharges after hours, please call [phone].   [name] 06/04/2016, 4:30 PM

## 2016-06-04 NOTE — Evaluation (Signed)
Occupational Therapy Evaluation Patient Details Name: [name] MRN: [id number] DOB: Jul 07, 1925 Today's Date: 06/04/2016    History of Present Illness Shane A Weisneris a 82 y.o.malepresenting with fever, shortness of breath. PMH is significant for essential hypertension, CAD, hernia, psoriais, OA, ischemic heart disease, MI, CHF, asthma, dyslipidemia, hx of CABG, depression and GERD.    Clinical Impression   Pt admitted with SOB. Pt currently with functional limitations due to the deficits listed below (see OT Problem List).  Pt will benefit from skilled OT to increase their safety and independence with ADL and functional mobility for ADL to facilitate discharge to venue listed below.      Follow Up Recommendations  Supervision/Assistance - 24 hour;SNF;Home health OT;Other (comment) (depending on progress)    Equipment Recommendations  None recommended by OT           Mobility Bed Mobility Overal bed mobility: Needs Assistance Bed Mobility: Supine to Sit;Sit to Supine     Supine to sit: Min assist Sit to supine: Mod assist      Transfers                 General transfer comment: NT         ADL Overall ADL's : Needs assistance/impaired Eating/Feeding: Minimal assistance   Grooming: Sitting;Minimal assistance                                 General ADL Comments: Pt sat EOB with OT for approx 10 minutes for taking meds and grooming and then needed to return to supine due to shortness of breath               Pertinent Vitals/Pain Pain Assessment: No/denies pain     Hand Dominance     Extremity/Trunk Assessment Upper Extremity Assessment Upper Extremity Assessment: Generalized weakness           Communication Communication Communication: No difficulties   Cognition Arousal/Alertness: Awake/alert Behavior During Therapy: WFL for tasks assessed/performed Overall Cognitive Status: Within Functional Limits for tasks  assessed                                Home Living Family/patient expects to be discharged to:: Private residence Living Arrangements: Spouse/significant other Available Help at Discharge: Family;Available 24 hours/day Type of Home: House Home Access: Stairs to enter     Home Layout: One level     Bathroom Shower/Tub: Tub/shower unit;Curtain   Biochemist, clinical: Standard     Home Equipment: Environmental consultant - 2 wheels;Grab bars - tub/shower;Shower seat          Prior Functioning/Environment Level of Independence: Independent with assistive device(s)        Comments: uses RW in home, rollator when out        OT Problem List: Decreased strength;Decreased activity tolerance   OT Treatment/Interventions: Self-care/ADL training;Patient/family education;DME and/or AE instruction;Energy conservation    OT Goals(Current goals can be found in the care plan section) Acute Rehab OT Goals Patient Stated Goal: breathe better OT Goal Formulation: With patient Time For Goal Achievement: 06/18/16 Potential to Achieve Goals: Good  OT Frequency: Min 2X/week              End of Session Nurse Communication: Mobility status  Activity Tolerance: Patient limited by fatigue Patient left: in bed;with call bell/phone within reach;with bed alarm set  Time: 1000-1019 OT Time Calculation (min): 19 min Charges:  OT General Charges $OT Visit: 1 Procedure OT Evaluation $OT Eval Moderate Complexity: 1 Procedure G-Codes:    Payton Mccallum D 06-05-16, 11:18 AM

## 2016-06-04 NOTE — Procedures (Signed)
Successful US guided right thoracentesis. Yielded 1.2L of hazy amber colored fluid. Pt tolerated procedure well. No immediate complications.  Specimen was sent for labs. CXR ordered.  Ascencion Dike PA-C 06/04/2016 12:28 PM

## 2016-06-04 NOTE — Progress Notes (Signed)
Pharmacy Antibiotic Note  [name] is a 82 y.o. male admitted on 06/02/2016 with fever, shortness of breath, possible HCAP.  Pharmacy has been consulted for vancomycin and zosyn dosing.   Patient continues to have difficulty breathing and had therapeutic/diagnostic thoracentesis on 10/6 which removed 1.2 liters of fluid. Patient remains afebrile and WBC WNL.   Plan: Vancomycin 750 mg q12h Zosyn 3.375 q8h extended infusion Monitor renal function, Cx, vanc troughs prn  Temp (24hrs), Avg:98.9 F (37.2 C), Min:98.6 F (37 C), Max:99.6 F (37.6 C)   Recent Labs Lab 06/01/16 0823 06/02/16 1704 06/02/16 1717 06/02/16 2052 06/03/16 0540 06/04/16 0445  WBC 11.0* 11.5*  --   --  9.0  --   CREATININE 0.82 0.79  --   --  0.66 0.75  LATICACIDVEN  --   --  1.16 1.23  --   --     Estimated Creatinine Clearance: 63.7 mL/min (by C-G formula based on SCr of 0.75 mg/dL).    No Known Allergies  Antimicrobials this admission:  Vanc 10/4 >> Zosyn 10/4 >>  Dose adjustments this admission:  N/A  Microbiology results:  10/4 BCx: Sent 10/4 UCx: Sent  Thank you for allowing pharmacy to be a part of this patient's care.  Dawayne Cirri, PharmD Candidate 06/04/2016 1:09 PM

## 2016-06-05 ENCOUNTER — Inpatient Hospital Stay (HOSPITAL_COMMUNITY): Payer: Medicare Other

## 2016-06-05 DIAGNOSIS — J9621 Acute and chronic respiratory failure with hypoxia: Secondary | ICD-10-CM

## 2016-06-05 DIAGNOSIS — R0600 Dyspnea, unspecified: Secondary | ICD-10-CM

## 2016-06-05 LAB — BASIC METABOLIC PANEL
ANION GAP: 6 (ref 5–15)
BUN: 9 mg/dL (ref 6–20)
CO2: 27 mmol/L (ref 22–32)
Calcium: 8.9 mg/dL (ref 8.9–10.3)
Chloride: 103 mmol/L (ref 101–111)
Creatinine, Ser: 0.65 mg/dL (ref 0.61–1.24)
Glucose, Bld: 92 mg/dL (ref 65–99)
POTASSIUM: 3.9 mmol/L (ref 3.5–5.1)
SODIUM: 136 mmol/L (ref 135–145)

## 2016-06-05 LAB — TRIGLYCERIDES, BODY FLUIDS: Triglycerides, Fluid: 24 mg/dL

## 2016-06-05 LAB — CBC
HCT: 26.2 % — ABNORMAL LOW (ref 39.0–52.0)
Hemoglobin: 7.9 g/dL — ABNORMAL LOW (ref 13.0–17.0)
MCH: 27.6 pg (ref 26.0–34.0)
MCHC: 30.2 g/dL (ref 30.0–36.0)
MCV: 91.6 fL (ref 78.0–100.0)
PLATELETS: 533 10*3/uL — AB (ref 150–400)
RBC: 2.86 MIL/uL — AB (ref 4.22–5.81)
RDW: 15.2 % (ref 11.5–15.5)
WBC: 7.5 10*3/uL (ref 4.0–10.5)

## 2016-06-05 LAB — MISC LABCORP TEST (SEND OUT): Labcorp test code: 88062

## 2016-06-05 LAB — VANCOMYCIN, TROUGH: VANCOMYCIN TR: 13 ug/mL — AB (ref 15–20)

## 2016-06-05 LAB — HEMOGLOBIN AND HEMATOCRIT, BLOOD
HCT: 26.8 % — ABNORMAL LOW (ref 39.0–52.0)
HEMOGLOBIN: 8.3 g/dL — AB (ref 13.0–17.0)

## 2016-06-05 LAB — PREPARE RBC (CROSSMATCH)

## 2016-06-05 LAB — LACTATE DEHYDROGENASE: LDH: 164 U/L (ref 98–192)

## 2016-06-05 MED ORDER — VANCOMYCIN HCL IN DEXTROSE 1-5 GM/200ML-% IV SOLN
1000.0000 mg | Freq: Two times a day (BID) | INTRAVENOUS | Status: DC
  Administered 2016-06-05 – 2016-06-07 (×4): 1000 mg via INTRAVENOUS
  Filled 2016-06-05 (×5): qty 200

## 2016-06-05 MED ORDER — POLYETHYLENE GLYCOL 3350 17 G PO PACK
17.0000 g | PACK | Freq: Every day | ORAL | Status: DC
  Administered 2016-06-05 – 2016-06-08 (×4): 17 g via ORAL
  Filled 2016-06-05 (×4): qty 1

## 2016-06-05 MED ORDER — FUROSEMIDE 10 MG/ML IJ SOLN
40.0000 mg | Freq: Once | INTRAMUSCULAR | Status: AC
  Administered 2016-06-05: 40 mg via INTRAVENOUS
  Filled 2016-06-05: qty 4

## 2016-06-05 MED ORDER — DOCUSATE SODIUM 100 MG PO CAPS
100.0000 mg | ORAL_CAPSULE | Freq: Every day | ORAL | Status: DC
  Administered 2016-06-05 – 2016-06-08 (×4): 100 mg via ORAL
  Filled 2016-06-05 (×4): qty 1

## 2016-06-05 MED ORDER — SODIUM CHLORIDE 0.9 % IV SOLN
Freq: Once | INTRAVENOUS | Status: AC
  Administered 2016-06-05: 16:00:00 via INTRAVENOUS

## 2016-06-05 NOTE — Progress Notes (Signed)
Pharmacy Antibiotic Note  [name] is a 82 y.o. male continues on day #4 vancomycin and zosyn for a large loculated pleural effusion on his R lung, s/p thoracentesis yesterday. A vancomycin trough drawn today is slightly below goal at 13 (goal 15-20). Renal function has remained stable.  Plan: 1) Increase vancomycin to 1g IV q12 2) Continue zosyn 3.375g IV q8 (4 hour infusion)  Height: 5\' 10"  (177.8 cm) Weight: 170 lb 12.8 oz (77.5 kg) IBW/kg (Calculated) : 73  Temp (24hrs), Avg:98.3 F (36.8 C), Min:97.5 F (36.4 C), Max:98.8 F (37.1 C)   Recent Labs Lab 06/01/16 0823 06/02/16 1704 06/02/16 1717 06/02/16 2052 06/03/16 0540 06/04/16 0445 06/05/16 0551 06/05/16 1935  WBC 11.0* 11.5*  --   --  9.0  --  7.5  --   CREATININE 0.82 0.79  --   --  0.66 0.75 0.65  --   LATICACIDVEN  --   --  1.16 1.23  --   --   --   --   VANCOTROUGH  --   --   --   --   --   --   --  13*    Estimated Creatinine Clearance: 64.6 mL/min (by C-G formula based on SCr of 0.65 mg/dL).    No Known Allergies  Antimicrobials this admission: 10/4 Vancomycin >> 10/4 Zosyn >>  Dose adjustments this admission: 10/7 VT = 13 on 750mg  q12, increase to 1g q12  Microbiology results: 10/4 BCx>> ngtd 10/4 UCx>> negative, final 10/6 Pleural fluid  >>  Thank you for allowing pharmacy to be a part of this patient's care.  Deboraha Sprang 06/05/2016 8:36 PM

## 2016-06-05 NOTE — Progress Notes (Signed)
Paged Family Medicine Dr. Juanito Doom, advised pt condition of increased work of breathing and wheezing throughout, Dr. Juanito Doom advised will order chest x-ray.

## 2016-06-05 NOTE — Progress Notes (Signed)
FPTS Interim Progress Note  S: Patient reports that breathing is doing better.  He notes some sleepiness.  Denies CP, abdominal pain, nausea, vomiting.  Asking for ensure and juice.  O: BP (!) 118/53 (BP Location: Left Arm)   Pulse 75   Temp 98.8 F (37.1 C) (Oral)   Resp 19   Ht 5\' 10"  (1.778 m)   Wt 170 lb 12.8 oz (77.5 kg)   SpO2 96%   BMI 24.51 kg/m   Gen: elderly male resting in bed, NAD Cardio: regular rate Pulm: lung exam unchanged, now less dyspneic with speech, 5L  in place, normal WOB  A/P: [name] is a 82 y.o. male that presents for HAP.  He is 1 day s/p thoracentesis.  Body fluid labs suggestive of exudative process.  Fluid cx pending.  CCM has seen patient and are following along.  Appreciate recs. MEW score currently zero. - If worsening respiratory status will plan to transfer to SDU - Continue abx - Otherwise, will continue to monitor  Janora Norlander, DO 06/05/2016, 9:12 PM PGY-3, [place] Medicine Service pager [phone]

## 2016-06-05 NOTE — Progress Notes (Signed)
Pt has wheezing thoughout, crackles noted RLL.  Gave Duoneb tx pred 50 po and Dulera inhaler.  Respiratory on floor, asked respiratory to see patient.  O2 sat 91% on 5 lpm via .

## 2016-06-05 NOTE — Progress Notes (Signed)
Family Medicine Teaching Service Daily Progress Note Intern Pager: [phone]  Patient name: [name] Medical record number: [id number] Date of birth: Dec 11, 1924 Age: 82 y.o. Gender: male  Primary Care Provider: Bufford Lope, DO Consultants: None Code Status: Full code  Pt Overview and Major Events to Date:  10/4: Admitted to FMTS under attending, [name] 10/5: CTA found large loculated R pleural effusion, plan to have thoracentesis  Assessment and Plan: JACHOB HAYRE is a 82 y.o. male presenting with fever, shortness of breath. PMH is significant for essential hypertension, CAD, hernia, psoriais, OA, ischemic heart disease, MI, CHF, asthma, dyslipidemia, hx of CABG, depression and GERD.   HCAP: Increasing O2 needs. On 5L [place] saturating at 91% this am. CTA: large loculated pleural effusion on right side.  Afebrile >24h. VSS.  BCx NGTD x2d. UCx neg. S/p thoracentesis 10/6.  Fluid hazy/ turbid appearing. WBC elevated 1239. Neutrophils elevated 70%. LDH elevated 178. Protein 3.4. Gram stain no organisms.  No serum LDH, CBC from 10/6 obtained.  However, body fluid labs thus far are suggestive of exudative process.  10/7 CXR loculated right sided pleural effusion, small left pleural effusion, persistent edema. - LDH added on to today's labs.  - continue Vanc/Zosyn per pharm consult (10/4>) - Monitor renal function on Vanc - 50mg  prednisone 5 day burst (10/6>) - Body fluid cx , cytology, pH, TG, Cholesterol pending - c/s CCM this am.  Concern that this patient will require higher level of respiratory care soon.  Discussed care with Dr Chase Caller, who will see this afternoon.  Appreciate recs. - Will consider move to SDU pending CCM assessment - cardiac monitoring, continuous pulse ox - vitals per unit routine - monitor respiratory status; O2 as needed to maintain sat >90% - trend fevers - OT eval pending - DC IVFs  Abdominal pain Multifactorial. Could be due to recent surgery (see below) vs  pancreatitis. Elevated lipase on admission was 137. Denies abdominal pain this am.  No nausea, vomiting. - continue to monitor - IV hydration - Care discussed with [facility] 10/6: If develops S&S of pancreatitis will make NPO and offer pain medications prn  Anemia Hgb 7.9 this am. Recent surgeries could be cause of anemia.  Has h/o significant cardiac disease; transfusion threshold <8 - Type and screen.  Transfuse 1 unit now. - f/u post transfusion H/H.  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 Metal stent placement and removal of his internal/external biliary drain. GI follwoed up with patient today and they do not believe biliary source as the cause of his fevers. LFTs slighly elevated on admission and have since normalized.  - CMP in am - hold off on cipro and flagyl; on vanc/zoysn IV for HCAP which covers same pathogens   HTN BP 135/62.  - continue home regimen of imdur, metoprolol - monitor  Chronic systolic HF Last Echo on [id number] showing normal EF and diastolic dysfunction. Prior echos have showed G1DD with pEF.  Last followed with cardiology in August.  Lasix 40mg  qd at home per patient. - IVFs dc'd - Lasix IV 40x1 - I/Os, daily weights - continue home metoprolol and imdur  Hx of CABG with CAD. HLD He had CABG in 1986. He had a redo CABG in 1993. He had PCI in 2008 and 2010. 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 Stable.  -  Continue daily aspirin 81 mg - Nitroglycerin sublingual if needed for chest pain - not on statin therapy.  Will need to revisit this with patient.  Asthma Worsened in setting of pneumonia with wheezing on exam.  - Continue Dulera - scheduled duonebs 3x daily - 50mg  prednisone 5 day burst  Depression - continue home Zoloft 150 mg daily  BPH - continue Flomax 0.4 mg daily and  finasteride - monitor UOP  Insomnia - Continue home Melatonin 3 mg - Continue home Trazodone 50 mg daily  A. Flutter S/p pacemaker in 2016. Not on anticoagulation given history of falls and syncope.  - Continue beta blocker.  FEN/GI: Diet heart, SLIV, Ensure supplement bid between meals Prophylaxis: Lovenox sub-q  Disposition: pending medical improvement  Subjective:  Patient reports that breathing is worse than yesterday but improved compared to this morning after being increased to 6L [place].  Denies CP, abdominal pain, nausea, vomiting.  Objective: Temp:  [97.5 F (36.4 C)-98.6 F (37 C)] 97.5 F (36.4 C) (10/07 0536) Pulse Rate:  [72-84] 82 (10/07 0536) Resp:  [20-22] 20 (10/07 0536) BP: (108-142)/(44-86) 131/86 (10/07 0536) SpO2:  [91 %-100 %] 100 % (10/07 0536) Weight:  [170 lb 12.8 oz (77.5 kg)] 170 lb 12.8 oz (77.5 kg) (10/06 2111) Physical Exam: General: pleasant, elderlywhite male laying in bed, NAD, nasal canula @6L  in place Heart: RRR, no m/r/g, normal s1,s2. +murmur.No obvious gallops, or rubs noted. Palpable radial pulses bilaterally. Pacemaker. Lungs: dyspnea with speech, scattered expiratory wheezes throughout, decreased air movement Right worse than L with bibasilar crackles. Deal in place with 6L O2. Abd: soft, nontender, nondistended, +BS Psych: A&Ox3 with an appropriate affect.  Laboratory:  Recent Labs Lab 06/02/16 1704 06/03/16 0540 06/05/16 0551  WBC 11.5* 9.0 7.5  HGB 9.3* 8.5* 7.9*  HCT 29.8* 27.6* 26.2*  PLT 598* 512* 533*    Recent Labs Lab 06/01/16 0823 06/02/16 1704 06/03/16 0540 06/04/16 0445 06/05/16 0551  NA 134* 136 139 137 136  K 4.1 4.2 4.0 3.9 3.9  CL 102 104 102 102 103  CO2 26 25 25 27 27   BUN 19 19 15 11 9   CREATININE 0.82 0.79 0.66 0.75 0.65  CALCIUM 9.4 9.6 9.1 9.0 8.9  PROT 6.9 7.1  --  5.9*  --   BILITOT 0.5 0.4  --  0.5  --   ALKPHOS 261* 279*  --  215*  --   ALT 38 40  --  30  --   AST 52* 56*  --  40  --    GLUCOSE 115* 116* 112* 117* 92   Strep pneumo urinary antigen= negative  Imaging/Diagnostic Tests: Dg Chest 1 View  Result Date: 06/04/2016 CLINICAL DATA:  Right-sided thoracentesis. EXAM: CHEST 1 VIEW COMPARISON:  None. FINDINGS: Small right pleural effusion. No left pleural effusion. Bilateral mild interstitial thickening. No pneumothorax. Stable cardiomegaly. Prior CABG. Dual lead AICD. No acute osseous abnormality. IMPRESSION: 1. Small right pleural effusion. No pneumothorax status post right thoracentesis. Electronically Signed   By: Kathreen Devoid   On: 06/04/2016 12:46   Ct Angio Chest Pe W Or Wo Contrast  Result Date: 06/03/2016 CLINICAL DATA:  Chest pain, shortness of breath. EXAM: CT ANGIOGRAPHY CHEST WITH CONTRAST TECHNIQUE: Multidetector CT imaging of the chest was performed using the standard protocol during bolus administration of intravenous contrast. Multiplanar CT image reconstructions and MIPs were obtained to evaluate the vascular anatomy. CONTRAST:  80 mL of Isovue 370 intravenously. COMPARISON:  Radiographs of June 02, 2016. FINDINGS: Cardiovascular: Atherosclerosis of thoracic aorta is noted without aneurysm. Status post coronary artery bypass graft. There is no definite evidence of pulmonary embolus. Mediastinum/Nodes: No mediastinal mass or adenopathy is noted. Lungs/Pleura: No pneumothorax is noted. Mild left posterior basilar subsegmental atelectasis is noted. Large loculated right pleural effusion is noted. Upper Abdomen: Left hepatic pneumobilia is noted. Biliary stent is noted in common bile duct. Musculoskeletal: No significant osseous abnormality is noted. Review of the MIP images confirms the above findings. IMPRESSION: Aortic atherosclerosis. No evidence of pulmonary embolus. Large loculated right pleural effusion is noted. Electronically Signed   By: Marijo Conception, M.D.   On: 06/03/2016 12:26   US Thoracentesis Asp Pleural Space W/img Guide  Result Date:  06/04/2016 INDICATION: Pneumonia. History of chronic systolic heart failure. History of coronary artery disease. Right-sided pleural effusion. Request diagnostic and therapeutic thoracentesis. EXAM: ULTRASOUND GUIDED RIGHT THORACENTESIS MEDICATIONS: None. COMPLICATIONS: None immediate. PROCEDURE: An ultrasound guided thoracentesis was thoroughly discussed with the patient and questions answered. The benefits, risks, alternatives and complications were also discussed. The patient understands and wishes to proceed with the procedure. Written consent was obtained. Ultrasound was performed to localize and mark an adequate pocket of fluid in the right chest. The area was then prepped and draped in the normal sterile fashion. 1% Lidocaine was used for local anesthesia. Under ultrasound guidance a Safe-T-Centesis catheter was introduced. Thoracentesis was performed. The catheter was removed and a dressing applied. FINDINGS: A total of approximately 1.2 L of hazy, amber colored fluid was removed. Samples were sent to the laboratory as requested by the clinical team. IMPRESSION: Successful ultrasound guided right thoracentesis yielding 1.2 L of pleural fluid. Read by: Ascencion Dike PA-C Electronically Signed   By: Aletta Edouard M.D.   On: 06/04/2016 12:30    Janora Norlander, DO 06/05/2016, 9:28 AM PGY-3, [place] Intern pager: [phone], text pages welcome

## 2016-06-05 NOTE — Progress Notes (Signed)
1 unit PRBC's complete.  No S/S reaction.  Pt tolerated well.

## 2016-06-05 NOTE — Consult Note (Addendum)
PULMONARY / CRITICAL CARE MEDICINE   Name: [name] MRN: [id number] DOB: [birth year]    ADMISSION DATE:  06/02/2016 CONSULTATION DATE:  06/05/16  REFERRING MD:  [facility]PT[facility] Dr Lajuana Ripple   CHIEF COMPLAINT:  Worsening acute resp hypoxemic resp failure  HISTORY OF PRESENT ILLNESS:   - History from the patient, family practice intern Dr. 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 He started having spiking fevers since his surgical procedure. Gastric oncology services did not think the gallbladder was a source of the fever. He presented to family practice office and was admitted 06/02/2016. He had a normal lactic acid but elevated white count at admission. Chest x-ray showed worsening right basilar opacification and infiltrates with a small effusion. Despite treatment with IV antibiotics and hydration he continued to worsen. CT chest 06/03/2016 showed large right-sided pleural effusion. He underwent thoracentesis 06/04/2016 and had 1.11 L of amber color exudate removed but he continued to worsen now requiring 6 L of oxygen. Therefore pulmonary has been consulted. Currently is nontoxic but requiring high oxygen without high work of breathing. His MEWS score at this point is 1. Chest x-ray shows clearance of the right-sided effusion but persistent right lower lobe and left lower lobe pulmonary infiltrate with a right lower lobe being greater. He has been afebrile since admission with a normal white count.   PAST MEDICAL HISTORY :  He  has a past medical history of Anemia (2014); Arthritis; Asthma; Atrial flutter ([place]); Bacteremia (03/2016); Complication of anesthesia; Coronary artery disease; Depression; Diverticulosis; GERD (gastroesophageal reflux  disease); Hepatitis; Hiatal hernia; Hyperlipemia; Hypertension; Insomnia; Kidney stones; Myocardial infarction; On home oxygen therapy; Peripheral neuropathy ([place]); Peripheral vascular disease ([place]); Pneumonia (1960's); Presence of permanent cardiac pacemaker; Prostate cancer ([place]); Systolic CHF ([place]); and VT (ventricular tachycardia) ([place]) (06/2012).  PAST SURGICAL HISTORY: He  has a past surgical history that includes Cataract extraction w/ intraocular lens  implant, bilateral; Fracture surgery; Joint replacement; Rhinoplasty [id number]); Lumbar laminectomy/decompression microdiscectomy (08/10/2011); Lumbar wound debridement (08/22/2011); Cystoscopy w/ ureteral stent placement (Right, 02/28/2013); Tonsillectomy (1940); Appendectomy (1943); Hammer toe surgery (Right); Transurethral resection of prostate (x2); Foot Fusion (Left, 2000); Hardware Removal (Right, 2002); Cystoscopy with ureteroscopy and stent placement (Right, 03/23/2013); Holmium laser application (Right, [id number]); Intramedullary (im) nail intertrochanteric (Right, 08/29/2013); left and right heart catheterization with coronary angiogram (N/A, 07/15/2012); percutaneous coronary stent intervention (pci-s) (N/A, 07/15/2012); Coronary artery bypass graft (1986;  1993); Colonoscopy; Knee arthroscopy (Right, X 2); Inguinal hernia repair (Left, 01/31/2015); Insertion of mesh (Left, 01/31/2015); Cardiac catheterization (N/A, 04/11/2015); TEE without cardioversion (N/A, 04/28/2016); ir generic historical (05/01/2016); ERCP (N/A, 04/30/2016); Endoscopic retrograde cholangiopancreatography (ercp) with propofol (04/30/2016); Biliary drainage (05/14/2016); and ir generic historical (06/01/2016).  No Known Allergies  No current facility-administered medications on file prior to encounter.    Current Outpatient Prescriptions on File Prior to Encounter  Medication Sig  . acetaminophen (TYLENOL) 500 MG tablet Take 1,000 mg by mouth every 6 (six) hours as needed for  fever.  [name] albuterol (PROVENTIL HFA;VENTOLIN HFA) 108 (90 Base) MCG/ACT inhaler Inhale 2 puffs into the lungs every 6 (six) hours as needed for wheezing or shortness of breath.  . Amino Acids-Protein Hydrolys (FEEDING SUPPLEMENT, PRO-STAT SUGAR FREE 64,) LIQD Take 30 mLs by mouth 2 (two) times  daily.  . aspirin EC 81 MG EC tablet Take 1 tablet (81 mg total) by mouth daily.  . budesonide-formoterol (SYMBICORT) 80-4.5 MCG/ACT inhaler Inhale 2 puffs into the lungs 2 (two) times daily.  . Ca Phosphate-Cholecalciferol (CALTRATE GUMMY BITES) 250-400 MG-UNIT CHEW Chew 2 tablets by mouth 2 (two) times daily.  . carbamazepine (TEGRETOL) 100 MG chewable tablet Chew 300 mg by mouth 2 (two) times daily.  [name] docusate sodium (COLACE) 100 MG capsule Take 100 mg by mouth daily.   . feeding supplement, ENSURE ENLIVE, (ENSURE ENLIVE) LIQD Take 237 mLs by mouth 2 (two) times daily between meals.  . finasteride (PROSCAR) 5 MG tablet Take 5 mg by mouth every morning.   [name] ipratropium (ATROVENT HFA) 17 MCG/ACT inhaler Inhale 2 puffs into the lungs 2 (two) times daily as needed for wheezing.   [name] ipratropium-albuterol (DUONEB) 0.5-2.5 (3) MG/3ML SOLN Take 3 mLs by nebulization every 6 (six) hours as needed. (Patient taking differently: Take 3 mLs by nebulization every 6 (six) hours as needed (for SOB). )  . isosorbide mononitrate (IMDUR) 30 MG 24 hr tablet Take 1 tablet (30 mg total) by mouth daily.  . Melatonin 3 MG TABS Take 3 mg by mouth at bedtime.  . metoprolol succinate (TOPROL-XL) 12.5 mg TB24 24 hr tablet Take 12.5 mg by mouth daily.  . OXYGEN Inhale 3 L/min into the lungs continuous.  . potassium chloride SA (K-DUR,KLOR-CON) 20 MEQ tablet Take 20 mEq by mouth daily.   . ranitidine (ZANTAC) 150 MG tablet TAKE 1 TABLET BY MOUTH 2 TIMES DAILY. (Patient taking differently: Take 300 mg by mouth at bedtime)  . senna (SENOKOT) 8.6 MG tablet Take 1 tablet by mouth daily.   . sertraline (ZOLOFT) 100 MG tablet Take 150 mg by  mouth every morning.   . tamsulosin (FLOMAX) 0.4 MG CAPS capsule Take 1 capsule (0.4 mg total) by mouth daily.  . traZODone (DESYREL) 50 MG tablet Take 1 tablet (50 mg total) by mouth at bedtime.  [name] ipratropium (ATROVENT) 0.02 % nebulizer solution Take 2.5 mLs (0.5 mg total) by nebulization 2 (two) times daily as needed for wheezing or shortness of breath. (Patient not taking: Reported on 06/02/2016)  . nitroGLYCERIN (NITROSTAT) 0.4 MG SL tablet Place 1 tablet (0.4 mg total) under the tongue every 5 (five) minutes as needed for chest pain (As needed for chest pain).  [name] tiZANidine (ZANAFLEX) 2 MG tablet Take 1 tablet (2 mg total) by mouth every 8 (eight) hours as needed for muscle spasms.    FAMILY HISTORY:  His indicated that his mother is deceased. He indicated that his father is deceased. He indicated that his sister is deceased. He indicated that his maternal grandmother is deceased. He indicated that his maternal grandfather is deceased. He indicated that his paternal grandmother is deceased. He indicated that his paternal grandfather is deceased. He indicated that the status of his neg hx is unknown.    SOCIAL HISTORY: He  reports that he has never smoked. He has never used smokeless tobacco. He reports that he does not drink alcohol or use drugs.  REVIEW OF SYSTEMS:   Per hpi. Otherwise 11 point review of systems negative   VITAL SIGNS: BP 135/62 (BP Location: Left Arm)   Pulse 73   Temp 98.4 F (36.9 C) (Oral)   Resp 18   Ht 5\' 10"  (1.778 m)   Wt 77.5 kg (170 lb 12.8 oz)   SpO2 98%   BMI 24.51 kg/m  CURRENT MEWS SCORE 1  HEMODYNAMICS:    VENTILATOR SETTINGS:    INTAKE / OUTPUT: I/O last 3 completed shifts: In: 2050 [P.O.:600; I.V.:1200; IV Piggyback:250] Out: 1075 [Urine:1075]  PHYSICAL EXAMINATION: General:  Deconditioned elderly male lying in bed Neuro:  Fairly alert and oriented 3. Speech normal HEENT:  Nasal cannula oxygen on. No elevated JVP no neck  nodes Cardiovascular:  Regular rate and rhythm. Normal cardiac Lungs:  Basal crackles present but no increased work of breathing Abdomen:  Soft nontender Musculoskeletal:  No edema no sinus no clubbing Skin:  Appears intact in the exposed areas  LABS:  BMET  Recent Labs Lab 06/03/16 0540 06/04/16 0445 06/05/16 0551  NA 139 137 136  K 4.0 3.9 3.9  CL 102 102 103  CO2 25 27 27   BUN 15 11 9   CREATININE 0.66 0.75 0.65  GLUCOSE 112* 117* 92    Electrolytes  Recent Labs Lab 06/03/16 0540 06/04/16 0445 06/05/16 0551  CALCIUM 9.1 9.0 8.9    CBC  Recent Labs Lab 06/02/16 1704 06/03/16 0540 06/05/16 0551  WBC 11.5* 9.0 7.5  HGB 9.3* 8.5* 7.9*  HCT 29.8* 27.6* 26.2*  PLT 598* 512* 533*   No results for input(s): PROCALCITON in the last 168 hours.   Coag's  Recent Labs Lab 06/01/16 0823  APTT 36  INR 1.46    Sepsis Markers  Recent Labs Lab 06/02/16 1717 06/02/16 2052  LATICACIDVEN 1.16 1.23    ABG No results for input(s): PHART, PCO2ART, PO2ART in the last 168 hours.  Liver Enzymes  Recent Labs Lab 06/01/16 0823 06/02/16 1704 06/03/16 1659 06/04/16 0445  AST 52* 56*  --  40  ALT 38 40  --  30  ALKPHOS 261* 279*  --  215*  BILITOT 0.5 0.4  --  0.5  ALBUMIN 2.1* 2.2* 1.8* 1.7*    Cardiac Enzymes No results for input(s): TROPONINI, PROBNP in the last 168 hours.  Glucose No results for input(s): GLUCAP in the last 168 hours.  ImagingPersonally visualized Dg Chest 2v Repeat Same Day  Result Date: 06/05/2016 CLINICAL DATA:  82 year old male with a history of hypertension, prior cardiac surgery and known coronary artery disease. EXAM: CHEST  2 VIEW COMPARISON:  06/04/2016, 06/02/2016, CT 06/03/2016 FINDINGS: Cardiomediastinal silhouette likely unchanged though partially obscured by overlying lung and pleural disease. Surgical changes of prior median sternotomy and CABG. Cardiac pacing device on left chest wall with 2 leads in place,  unchanged. Aortic atherosclerosis. Interlobular septal thickening of right greater than left lungs. Blunting of the bilateral costophrenic angles. Opacity in the left than the right base partially obscure the hemidiaphragms and the heart borders. Veil like opacity of the right lung. On the lateral view there is evidence of loculated pleural effusion on the right, with small left-sided pleural effusion. IMPRESSION: Chest x-ray demonstrates partially loculated right-sided pleural effusion, similar to the CT performed 06/03/2016. Small left pleural effusion. Evidence of persisting edema. Aortic atherosclerosis with surgical changes of prior median sternotomy and CABG. Signed, Dulcy Fanny. Earleen Newport, DO [facility] Radiology Electronically Signed   By: Corrie Mckusick D.O.   On: 06/05/2016 10:34     ASSESSMENT / PLAN:  PULMONARY A: Acute on chronic hypoxemic respiratory failure. Deteriorated from 3 L oxygen to 6 L oxygen. Despite treatment with antibiotics and control of fevers since admission 06/02/2016. Also worsening despite thoracentesis 06/04/2016. Thoracentesis shows nonspecific exudate  - This is likely acute lung injury either from complication of  pneumonia or reexpansion pulmonary edema or a combination of the 2 - Possible acute on chronic diastolic heart failure  - Currently nontoxic with a low MEWS score of 1  P:   Agree with the Lasix dose Agree with antibiotics Supportive care Monitor MEWS score; transfer to stepdown ICU if patient deteriorates  Anti-infectives    Start     Dose/Rate Route Frequency Ordered Stop   06/03/16 0800  vancomycin (VANCOCIN) IVPB 750 mg/150 ml premix     750 mg 150 mL/hr over 60 Minutes Intravenous Every 12 hours 06/02/16 1919     06/02/16 2000  vancomycin (VANCOCIN) 1,750 mg in sodium chloride 0.9 % 500 mL IVPB     1,750 mg 250 mL/hr over 120 Minutes Intravenous  Once 06/02/16 1919 06/02/16 2346   06/02/16 2000   piperacillin-tazobactam (ZOSYN) IVPB 3.375 g     3.375 g 12.5 mL/hr over 240 Minutes Intravenous Every 8 hours 06/02/16 1919         FAMILY  - Updates: Wife and patient updated at the bedside. Discussed the case with family practice teaching service resident Dr. Lajuana Ripple     Dr. Brand Males, M.D., [facility].C.P Pulmonary and Critical Care Medicine Staff Physician [place] [facility] Pager: [phone], If no answer or between  15:00h - 7:00h: call [phone]  06/05/2016 2:27 PM

## 2016-06-06 ENCOUNTER — Inpatient Hospital Stay (HOSPITAL_COMMUNITY): Payer: Medicare Other

## 2016-06-06 DIAGNOSIS — J189 Pneumonia, unspecified organism: Principal | ICD-10-CM

## 2016-06-06 LAB — TYPE AND SCREEN
ABO/RH(D): AB POS
Antibody Screen: NEGATIVE
Unit division: 0

## 2016-06-06 LAB — COMPREHENSIVE METABOLIC PANEL
ALK PHOS: 190 U/L — AB (ref 38–126)
ALT: 29 U/L (ref 17–63)
AST: 38 U/L (ref 15–41)
Albumin: 1.6 g/dL — ABNORMAL LOW (ref 3.5–5.0)
Anion gap: 8 (ref 5–15)
BUN: 12 mg/dL (ref 6–20)
CALCIUM: 9 mg/dL (ref 8.9–10.3)
CO2: 29 mmol/L (ref 22–32)
CREATININE: 0.65 mg/dL (ref 0.61–1.24)
Chloride: 98 mmol/L — ABNORMAL LOW (ref 101–111)
GFR calc non Af Amer: >60 mL/min (ref >60)
GLUCOSE: 100 mg/dL — AB (ref 65–99)
Potassium: 3.8 mmol/L (ref 3.5–5.1)
SODIUM: 135 mmol/L (ref 135–145)
Total Bilirubin: 0.5 mg/dL (ref 0.3–1.2)
Total Protein: 5.8 g/dL — ABNORMAL LOW (ref 6.5–8.1)

## 2016-06-06 LAB — CBC
HCT: 28.5 % — ABNORMAL LOW (ref 39.0–52.0)
Hemoglobin: 8.9 g/dL — ABNORMAL LOW (ref 13.0–17.0)
MCH: 27.7 pg (ref 26.0–34.0)
MCHC: 31.2 g/dL (ref 30.0–36.0)
MCV: 88.8 fL (ref 78.0–100.0)
PLATELETS: 528 10*3/uL — AB (ref 150–400)
RBC: 3.21 MIL/uL — ABNORMAL LOW (ref 4.22–5.81)
RDW: 16 % — AB (ref 11.5–15.5)
WBC: 7.9 10*3/uL (ref 4.0–10.5)

## 2016-06-06 MED ORDER — FUROSEMIDE 10 MG/ML IJ SOLN
40.0000 mg | Freq: Once | INTRAMUSCULAR | Status: AC
  Administered 2016-06-06: 40 mg via INTRAVENOUS
  Filled 2016-06-06: qty 4

## 2016-06-06 MED ORDER — MORPHINE SULFATE (PF) 2 MG/ML IV SOLN: 1.0000 mg | INTRAVENOUS | Status: DC | PRN

## 2016-06-06 NOTE — Progress Notes (Signed)
Family Medicine Teaching Service Daily Progress Note Intern Pager: [phone]  Patient name: [name] Medical record number: [id number] Date of birth: 1924/12/01 Age: 82 y.o. Gender: male  Primary Care Provider: Bufford Lope, DO Consultants: None Code Status: Full code  Pt Overview and Major Events to Date:  10/4: Admitted to FMTS under attending, [name] 10/5: CTA found large loculated R pleural effusion, plan to have thoracentesis 10/6- thoracentesis 107- 1u pRBCs, lasix 40mg  IV once  Assessment and Plan: [name] is a 82 y.o. male presenting with fever, shortness of breath. PMH is significant for essential hypertension, CAD, hernia, psoriais, OA, ischemic heart disease, MI, CHF, asthma, dyslipidemia, hx of CABG, depression and GERD.   HCAP: Increasing O2 needs. On 5L [place] saturating at 91% this am. CTA: large loculated pleural effusion on right side.  Afebrile >24h. VSS.  BCx NGTD x2d. UCx neg. S/p thoracentesis 10/6.  Fluid hazy/ turbid appearing. WBC elevated 1239. Neutrophils elevated 70%. LDH elevated 178. Protein 3.4. Gram stain no organisms.  No serum LDH, CBC from 10/6 obtained.  However, body fluid labs thus far are suggestive of exudative process.  10/7 CXR loculated right sided pleural effusion similar to CTA performed on 10/5, small left pleural effusion, persistent edema. CXR on 10/8 with increase in patchy airspace opacity in the RUL which may be suggestive for asymmetric pulmonary edema on the R vs superimposed pneumonia. CCM consulted on 10/7 due to worsening status despite thoracentesis, noted this was most likely acute lung injury as either a complication of pneumonia vs reexpansion pulmonary edema.  - continue Vanc/Zosyn per pharm (10/4>) - Monitor renal function on Vanc - 50mg  prednisone 5 day burst (10/6>) - Body fluid cx , cytology, pH, TG, Cholesterol pending - no I&Os or daily weights on patient, will re-dose Lasix 40mg  IV again today given CXR findings.  -  echocardiogram ordered - f/u CCM  - Will consider move to SDU if MEWs score worsening - cardiac monitoring, continuous pulse ox - monitor respiratory status; O2 as needed to maintain sat >90% - trend fevers - OT eval pending  Abdominal pain, very mild on exam: Multifactorial. Could be due to recent surgery (see below) vs pancreatitis. Elevated lipase on admission was 137. Denies abdominal pain this am.  No nausea, vomiting. - continue to monitor - IV hydration - Care discussed with [facility] 10/6: If develops S&S of pancreatitis will make NPO and offer pain medications prn  Anemia Hgb 7.9 this am. Recent surgeries could be cause of anemia.  Has h/o significant cardiac disease; transfusion threshold <8. S/p 1u pRBCs on 10/7. Hgb 8.9 today.  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 Metal stent placement and removal of his internal/external biliary drain.  LFTs slighly elevated on admission and have since normalized.  - CMP in am - hold off on cipro and flagyl; on vanc/zoysn IV for HCAP which covers same pathogens   HTN Normotensive.   - continue home regimen of imdur, metoprolol - monitor  Chronic systolic HF Last Echo on [id number] showing normal EF and diastolic dysfunction. Prior echos have showed G1DD with pEF.  Last followed with cardiology in August.  Lasix 40mg  qd at home per patient. - Lasix IV 40x1 again today  - I/Os, daily weights - continue home metoprolol and imdur  Hx of CABG with CAD. HLD He had CABG in 1986. He had  a redo CABG in 1993. He had PCI in 2008 and 2010. 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 Stable.  - Continue daily aspirin 81 mg - Nitroglycerin sublingual if needed for chest pain - not on statin therapy.  Will need to revisit this with patient in the outpatient setting given all his recent GI surgeries.    Asthma Worsened in setting of pneumonia with wheezing on exam.  - Continue Dulera - scheduled duonebs 3x daily - 50mg  prednisone 5 day burst (D2/5)  Depression - continue home Zoloft 150 mg daily  BPH - continue Flomax 0.4 mg daily and finasteride - monitor UOP  Insomnia - Continue home Melatonin 3 mg - Continue home Trazodone 50 mg daily  A. Flutter S/p pacemaker in 2016. Not on anticoagulation given history of falls and syncope.  - Continue beta blocker.  FEN/GI: Diet heart, SLIV, Ensure supplement bid between meals Prophylaxis: Lovenox sub-q  Disposition: pending medical improvement  Subjective:  Doing well. Notes he has some difficulty with breathing in the AM once he's woken up. Notes he has significant congestion. States after his breathing treatment in the AM, he feels much better. Very mild abdominal pain. States he had urinary incontinence yesterday with the lasix and would like to be on the bedside commode today for the Lasix.   Objective: Temp:  [98.1 F (36.7 C)-98.9 F (37.2 C)] 98.4 F (36.9 C) (10/08 0903) Pulse Rate:  [72-84] 77 (10/08 0903) Resp:  [16-19] 18 (10/08 0903) BP: (106-136)/(51-62) 136/55 (10/08 0903) SpO2:  [94 %-98 %] 94 % (10/08 0903) Physical Exam: General: pleasant, elderlywhite male laying in bed, NAD, nasal canula @5L  in place Heart: RRR, no m/r/g, normal s1,s2. +murmur.No obvious gallops, or rubs noted. Palpable radial pulses bilaterally. Pacemaker. Mild JVD.  Lungs: able to speak in full sentences. No increased WOB. Intermittent scattered expiratory wheeze. Bibasilar crackles. [place] in place with 5L O2. Ext: no pitting edema. Abd: soft, nontender, nondistended, +BS Psych: A&Ox3 with an appropriate affect.  Laboratory:  Recent Labs Lab 06/03/16 0540 06/05/16 0551 06/05/16 2221 06/06/16 0520  WBC 9.0 7.5  --  7.9  HGB 8.5* 7.9* 8.3* 8.9*  HCT 27.6* 26.2* 26.8* 28.5*  PLT 512* 533*  --  528*    Recent Labs Lab  06/02/16 1704  06/04/16 0445 06/05/16 0551 06/06/16 0520  NA 136  < > 137 136 135  K 4.2  < > 3.9 3.9 3.8  CL 104  < > 102 103 98*  CO2 25  < > 27 27 29   BUN 19  < > 11 9 12   CREATININE 0.79  < > 0.75 0.65 0.65  CALCIUM 9.6  < > 9.0 8.9 9.0  PROT 7.1  --  5.9*  --  5.8*  BILITOT 0.4  --  0.5  --  0.5  ALKPHOS 279*  --  215*  --  190*  ALT 40  --  30  --  29  AST 56*  --  40  --  38  GLUCOSE 116*  < > 117* 92 100*  < > = values in this interval not displayed. Strep pneumo urinary antigen= negative  Imaging/Diagnostic Tests: Dg Chest 1 View  Result Date: 06/04/2016 CLINICAL DATA:  Right-sided thoracentesis. EXAM: CHEST 1 VIEW COMPARISON:  None. FINDINGS: Small right pleural effusion. No left pleural effusion. Bilateral mild interstitial thickening. No pneumothorax. Stable cardiomegaly. Prior CABG. Dual lead AICD. No acute osseous abnormality. IMPRESSION: 1. Small right pleural  effusion. No pneumothorax status post right thoracentesis. Electronically Signed   By: Kathreen Devoid   On: 06/04/2016 12:46   Dg Chest 2v Repeat Same Day  Result Date: 06/05/2016 CLINICAL DATA:  82 year old male with a history of hypertension, prior cardiac surgery and known coronary artery disease. EXAM: CHEST  2 VIEW COMPARISON:  06/04/2016, 06/02/2016, CT 06/03/2016 FINDINGS: Cardiomediastinal silhouette likely unchanged though partially obscured by overlying lung and pleural disease. Surgical changes of prior median sternotomy and CABG. Cardiac pacing device on left chest wall with 2 leads in place, unchanged. Aortic atherosclerosis. Interlobular septal thickening of right greater than left lungs. Blunting of the bilateral costophrenic angles. Opacity in the left than the right base partially obscure the hemidiaphragms and the heart borders. Veil like opacity of the right lung. On the lateral view there is evidence of loculated pleural effusion on the right, with small left-sided pleural effusion. IMPRESSION: Chest  x-ray demonstrates partially loculated right-sided pleural effusion, similar to the CT performed 06/03/2016. Small left pleural effusion. Evidence of persisting edema. Aortic atherosclerosis with surgical changes of prior median sternotomy and CABG. Signed, Dulcy Fanny. Earleen Newport, DO [facility] Radiology Electronically Signed   By: Corrie Mckusick D.O.   On: 06/05/2016 10:34   Dg Chest Port 1 View  Result Date: 06/06/2016 CLINICAL DATA:  Hypoxia with acute respiratory failure EXAM: PORTABLE CHEST 1 VIEW COMPARISON:  June 05, 2016 chest radiograph and chest CT June 03, 2016 FINDINGS: There is patchy airspace consolidation throughout much of the right lung, with slight increase in airspace consolidation in the right upper lobe near the apex. Other areas of airspace opacity remains stable. There is a right pleural effusion which may be marginally larger. There is patchy atelectasis in the left base region. Left lung is otherwise clear. There is cardiomegaly with the pulmonary vascularity overall within normal limits. Pacemaker leads are attached to the right atrium and right ventricle. There is atherosclerotic calcification in the aorta. No adenopathy. IMPRESSION: Increase in patchy airspace opacity in the right upper lobe. Airspace opacity elsewhere on the right is stable with small right pleural effusion. There is atelectasis in the left base, stable. Stable cardiac enlargement. Aortic atherosclerosis. Pacemaker leads attached to right atrium and right ventricle. Appearance is most suggestive of asymmetric pulmonary edema on the right. There may well be superimposed pneumonia, particularly given the new opacity in the right upper lobe. Electronically Signed   By: Lowella Grip III M.D.   On: 06/06/2016 09:10   US Thoracentesis Asp Pleural Space W/img Guide  Result Date: 06/04/2016 INDICATION: Pneumonia. History of chronic systolic heart failure. History of  coronary artery disease. Right-sided pleural effusion. Request diagnostic and therapeutic thoracentesis. EXAM: ULTRASOUND GUIDED RIGHT THORACENTESIS MEDICATIONS: None. COMPLICATIONS: None immediate. PROCEDURE: An ultrasound guided thoracentesis was thoroughly discussed with the patient and questions answered. The benefits, risks, alternatives and complications were also discussed. The patient understands and wishes to proceed with the procedure. Written consent was obtained. Ultrasound was performed to localize and mark an adequate pocket of fluid in the right chest. The area was then prepped and draped in the normal sterile fashion. 1% Lidocaine was used for local anesthesia. Under ultrasound guidance a Safe-T-Centesis catheter was introduced. Thoracentesis was performed. The catheter was removed and a dressing applied. FINDINGS: A total of approximately 1.2 L of hazy, amber colored fluid was removed. Samples were sent to the laboratory as requested by the clinical team. IMPRESSION: Successful ultrasound guided right thoracentesis yielding 1.2  L of pleural fluid. Read by: Ascencion Dike PA-C Electronically Signed   By: Aletta Edouard M.D.   On: 06/04/2016 12:30    Archie Patten, MD 06/06/2016, 9:30 AM PGY-3, [place] Intern pager: [phone], text pages welcome

## 2016-06-06 NOTE — Consult Note (Signed)
PULMONARY / CRITICAL CARE MEDICINE   Name: AUGUSTO BUCKO MRN: [id number] DOB: 02-02-1925    ADMISSION DATE:  06/02/2016 CONSULTATION DATE:  06/05/16  REFERRING MD:  FPTS Dr Lajuana Ripple   CHIEF COMPLAINT:  Worsening acute resp hypoxemic resp failure briuef - History from the patient, family practice intern Dr. 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 He started having spiking fevers since his surgical procedure. Gastric oncology services did not think the gallbladder was a source of the fever. He presented to family practice office and was admitted 06/02/2016. He had a normal lactic acid but elevated white count at admission. Chest x-ray showed worsening right basilar opacification and infiltrates with a small effusion. Despite treatment with IV antibiotics and hydration he continued to worsen. CT chest 06/03/2016 showed large right-sided pleural effusion. He underwent thoracentesis 06/04/2016 and had 1.11 L of amber color exudate removed but he continued to worsen now requiring 6 L of oxygen. Therefore pulmonary has been consulted. Currently is nontoxic but requiring high oxygen without high work of breathing. His MEWS score at this point is 1. Chest x-ray shows clearance of the right-sided effusion but persistent right lower lobe and left lower lobe pulmonary infiltrate with a right lower lobe being greater. He has been afebrile since admission with a normal white count.   EVENTS 06/02/2016 - admit 10/4: Admitted to FMTS under attending, [name] 10/5: CTA found large loculated R pleural effusion, plan to have thoracentesis 10/6- thoracentesis 107- 1u pRBCs, lasix 40mg  IV once and pccm consult   SUBJECTIVE/OVERNIGHT/INTERVAL HX 10/8 - some  better but feels deconditioned. 3L [place] - 92% at rest but does dip when talking (home is 3L ). Reports used to see Dr Melvyn Novas > 5 years ago after sepsis admit  VITAL SIGNS: BP (!) 136/55 (BP Location: Left Arm)   Pulse 77   Temp 98.4 F (36.9 C) (Oral)   Resp 18   Ht 5\' 10"  (1.778 m)   Wt 77.5 kg (170 lb 12.8 oz)   SpO2 97%   BMI 24.51 kg/m    CURRENT MEWS SCORE 0  HEMODYNAMICS:    VENTILATOR SETTINGS:    INTAKE / OUTPUT: I/O last 3 completed shifts: In: 2280 [P.O.:480; I.V.:1200; IV Piggyback:600] Out: 750 [Urine:750]  PHYSICAL EXAMINATION: General:  Deconditioned elderly male lying in bed Neuro:  Fairly alert and oriented 3. Speech normal HEENT:  Nasal cannula oxygen on. No elevated JVP no neck nodes Cardiovascular:  Regular rate and rhythm. Normal cardiac Lungs:  Basal crackles present but no increased work of breathing Abdomen:  Soft nontender Musculoskeletal:  No edema no sinus no clubbing Skin:  Appears intact in the exposed areas  LABS:  BMET  Recent Labs Lab 06/04/16 0445 06/05/16 0551 06/06/16 0520  NA 137 136 135  K 3.9 3.9 3.8  CL 102 103 98*  CO2 27 27 29   BUN 11 9 12   CREATININE 0.75 0.65 0.65  GLUCOSE 117* 92 100*    Electrolytes  Recent Labs Lab 06/04/16 0445 06/05/16 0551 06/06/16 0520  CALCIUM 9.0 8.9 9.0    CBC  Recent Labs Lab 06/03/16 0540 06/05/16 0551 06/05/16 2221 06/06/16 0520  WBC 9.0 7.5  --  7.9  HGB 8.5* 7.9* 8.3* 8.9*  HCT 27.6*  26.2* 26.8* 28.5*  PLT 512* 533*  --  528*   No results for input(s): PROCALCITON in the last 168 hours.   Coag's  Recent Labs Lab 06/01/16 0823  APTT 36  INR 1.46    Sepsis Markers  Recent Labs Lab 06/02/16 1717 06/02/16 2052  LATICACIDVEN 1.16 1.23    ABG No results for input(s): PHART, PCO2ART, PO2ART in the last 168 hours.  Liver Enzymes  Recent Labs Lab 06/02/16 1704 06/03/16 1659 06/04/16 0445 06/06/16 0520  AST 56*  --  40 38  ALT 40  --  30 29   ALKPHOS 279*  --  215* 190*  BILITOT 0.4  --  0.5 0.5  ALBUMIN 2.2* 1.8* 1.7* 1.6*    Cardiac Enzymes No results for input(s): TROPONINI, PROBNP in the last 168 hours.  Glucose No results for input(s): GLUCAP in the last 168 hours.  ImagingPersonally visualized Dg Chest Port 1 View  Result Date: 06/06/2016 CLINICAL DATA:  Hypoxia with acute respiratory failure EXAM: PORTABLE CHEST 1 VIEW COMPARISON:  June 05, 2016 chest radiograph and chest CT June 03, 2016 FINDINGS: There is patchy airspace consolidation throughout much of the right lung, with slight increase in airspace consolidation in the right upper lobe near the apex. Other areas of airspace opacity remains stable. There is a right pleural effusion which may be marginally larger. There is patchy atelectasis in the left base region. Left lung is otherwise clear. There is cardiomegaly with the pulmonary vascularity overall within normal limits. Pacemaker leads are attached to the right atrium and right ventricle. There is atherosclerotic calcification in the aorta. No adenopathy. IMPRESSION: Increase in patchy airspace opacity in the right upper lobe. Airspace opacity elsewhere on the right is stable with small right pleural effusion. There is atelectasis in the left base, stable. Stable cardiac enlargement. Aortic atherosclerosis. Pacemaker leads attached to right atrium and right ventricle. Appearance is most suggestive of asymmetric pulmonary edema on the right. There may well be superimposed pneumonia, particularly given the new opacity in the right upper lobe. Electronically Signed   By: Lowella Grip III M.D.   On: 06/06/2016 09:10     ASSESSMENT / PLAN:  PULMONARY A: Acute on chronic hypoxemic respiratory failure. Deteriorated from 3 L oxygen to 6 L oxygen. Despite treatment with antibiotics and control of fevers since admission 06/02/2016. Also worsening despite thoracentesis 06/04/2016. Thoracentesis shows nonspecific  exudate  - This is likely acute lung injury either from complication of pneumonia or reexpansion pulmonary edema or a combination of the 2 - Possible acute on chronic diastolic heart failure  - Currently nontoxic with a low MEWS score of 0  P:   Agree with antibiotics Supportive care - please avoid PRBC for hgb >/= 7gm% - increases risk for ALI Monitor MEWS score; transfer to stepdown ICU if patient deteriorates  Anti-infectives    Start     Dose/Rate Route Frequency Ordered Stop   06/05/16 2100  vancomycin (VANCOCIN) IVPB 1000 mg/200 mL premix     1,000 mg 200 mL/hr over 60 Minutes Intravenous Every 12 hours 06/05/16 2036     06/03/16 0800  vancomycin (VANCOCIN) IVPB 750 mg/150 ml premix  Status:  Discontinued     750 mg 150 mL/hr over 60 Minutes Intravenous Every 12 hours 06/02/16 1919 06/05/16 2036   06/02/16 2000  vancomycin (VANCOCIN) 1,750 mg in sodium chloride 0.9 % 500 mL IVPB     1,750 mg 250 mL/hr over 120 Minutes Intravenous  Once  06/02/16 1919 06/02/16 2346   06/02/16 2000  piperacillin-tazobactam (ZOSYN) IVPB 3.375 g     3.375 g 12.5 mL/hr over 240 Minutes Intravenous Every 8 hours 06/02/16 1919         FAMILY  - Updates: Wife and patient updated at the bedside.  PCCM will see again 06/07/16 and arrange fu with Dr Melvyn Novas     Dr. Brand Males, M.D., [facility].C.P Pulmonary and Critical Care Medicine Staff Physician [facility] Pager: [phone], If no answer or between  15:00h - 7:00h: call [phone]  06/06/2016 3:11 PM

## 2016-06-07 ENCOUNTER — Inpatient Hospital Stay (HOSPITAL_COMMUNITY): Payer: Medicare Other

## 2016-06-07 DIAGNOSIS — R0902 Hypoxemia: Secondary | ICD-10-CM

## 2016-06-07 DIAGNOSIS — J9811 Atelectasis: Secondary | ICD-10-CM

## 2016-06-07 DIAGNOSIS — J90 Pleural effusion, not elsewhere classified: Secondary | ICD-10-CM

## 2016-06-07 LAB — CULTURE, BLOOD (ROUTINE X 2)
CULTURE: NO GROWTH
Culture: NO GROWTH

## 2016-06-07 LAB — CBC
HCT: 27.4 % — ABNORMAL LOW (ref 39.0–52.0)
Hemoglobin: 8.7 g/dL — ABNORMAL LOW (ref 13.0–17.0)
MCH: 28.2 pg (ref 26.0–34.0)
MCHC: 31.8 g/dL (ref 30.0–36.0)
MCV: 88.7 fL (ref 78.0–100.0)
PLATELETS: 555 10*3/uL — AB (ref 150–400)
RBC: 3.09 MIL/uL — AB (ref 4.22–5.81)
RDW: 15.9 % — AB (ref 11.5–15.5)
WBC: 7 10*3/uL (ref 4.0–10.5)

## 2016-06-07 LAB — BASIC METABOLIC PANEL
Anion gap: 6 (ref 5–15)
BUN: 14 mg/dL (ref 6–20)
CO2: 29 mmol/L (ref 22–32)
CREATININE: 0.65 mg/dL (ref 0.61–1.24)
Calcium: 8.9 mg/dL (ref 8.9–10.3)
Chloride: 99 mmol/L — ABNORMAL LOW (ref 101–111)
GFR calc Af Amer: >60 mL/min (ref >60)
GLUCOSE: 111 mg/dL — AB (ref 65–99)
POTASSIUM: 3.5 mmol/L (ref 3.5–5.1)
SODIUM: 134 mmol/L — AB (ref 135–145)

## 2016-06-07 LAB — PH, BODY FLUID: PH, BODY FLUID: 8

## 2016-06-07 MED ORDER — LEVOFLOXACIN 750 MG PO TABS
750.0000 mg | ORAL_TABLET | ORAL | Status: DC
  Administered 2016-06-07 – 2016-06-08 (×2): 750 mg via ORAL
  Filled 2016-06-07 (×2): qty 1

## 2016-06-07 MED ORDER — LEVOFLOXACIN 750 MG PO TABS: 750.0000 mg | ORAL_TABLET | Freq: Every day | ORAL | Status: DC

## 2016-06-07 MED ORDER — FUROSEMIDE 40 MG PO TABS
40.0000 mg | ORAL_TABLET | Freq: Every day | ORAL | Status: DC
  Administered 2016-06-07 – 2016-06-08 (×2): 40 mg via ORAL
  Filled 2016-06-07 (×2): qty 1

## 2016-06-07 NOTE — Care Management Important Message (Signed)
Important Message  Patient Details  Name: [name] MRN: [id number] Date of Birth: 08/31/1924   Medicare Important Message Given:  Yes    Ineze Serrao 06/07/2016, 12:16 PM

## 2016-06-07 NOTE — Progress Notes (Signed)
Physical Therapy Treatment Patient Details Name: [name] MRN: [id number] DOB: 1925-03-15 Today's Date: 06/07/2016    History of Present Illness Shane A Weisneris a 82 y.o.malepresenting with fever, shortness of breath.  Patient with Rt pleural effusion and s/p thoracentesis 06/04/16.     PMH is significant for essential hypertension, CAD, hernia, psoriais, OA, ischemic heart disease, MI, CHF, asthma, dyslipidemia, hx of CABG, depression and GERD.     PT Comments    Pt able to ambulate today with o2 97-98% on 3 L/min.  He did demonstrate R foot drop, which he reports he sometimes wears a special shoe for, but not all the time.  He had decreased safety with turning during gait which causes him to be a fall risk.  Pt wants to go home with HHPT, but at this time, feel he needs short term SNF.  He lives with his 28 year old wife.  His niece reports they do have recessed room in the house with no rail.  Will continue to assess his mobility during acute stay for best d/c plan.  Follow Up Recommendations  SNF;Supervision for mobility/OOB;Other (comment) ([name] if he refuses SNF)     Equipment Recommendations  3in1 (PT)    Recommendations for Other Services       Precautions / Restrictions Precautions Precautions: Fall;Other (comment) (R foot drop) Precaution Comments: o2, R foot drop Restrictions Weight Bearing Restrictions: No    Mobility  Bed Mobility               General bed mobility comments: pt up in recliner upon arrival and left on BSC  Transfers Overall transfer level: Needs assistance   Transfers: Sit to/from Stand;Stand Pivot Transfers Sit to Stand: Min assist Stand pivot transfers: Min assist       General transfer comment: cues for hand placement  Ambulation/Gait Ambulation/Gait assistance: Min assist Ambulation Distance (Feet): 75 Feet Assistive device: Rolling walker (2 wheeled) Gait Pattern/deviations: Decreased dorsiflexion - right;Wide base of  support;Trunk flexed Gait velocity: decreased   General Gait Details: R foot drop.  With turns, R foot outside of RW and decreased safety. o2 97-98% on 3 L/min after gait   Stairs            Wheelchair Mobility    Modified Rankin (Stroke Patients Only)       Balance Overall balance assessment: Needs assistance Sitting-balance support: Feet supported Sitting balance-Leahy Scale: Fair     Standing balance support: Bilateral upper extremity supported Standing balance-Leahy Scale: Poor                      Cognition Arousal/Alertness: Awake/alert Behavior During Therapy: WFL for tasks assessed/performed Overall Cognitive Status: Within Functional Limits for tasks assessed                      Exercises      General Comments        Pertinent Vitals/Pain Pain Assessment: No/denies pain    Home Living                      Prior Function            PT Goals (current goals can now be found in the care plan section) Acute Rehab PT Goals Patient Stated Goal: breathe better PT Goal Formulation: With patient Time For Goal Achievement: 06/11/16 Potential to Achieve Goals: Good Progress towards PT goals: Progressing toward goals  Frequency    Min 3X/week      PT Plan Discharge plan needs to be updated    Co-evaluation             End of Session Equipment Utilized During Treatment: Gait belt;Oxygen Activity Tolerance: Patient tolerated treatment well Patient left: Other (comment);with call bell/phone within reach (on [facility])     Time: [id number] PT Time Calculation (min) (ACUTE ONLY): 20 min  Charges:  $Gait Training: 8-22 mins                    G Codes:      [name] 06/07/2016, 2:15 PM

## 2016-06-07 NOTE — Progress Notes (Signed)
Family Medicine Teaching Service Daily Progress Note Intern Pager: [phone]  Patient name: [name] Medical record number: [id number] Date of birth: 10-18-1924 Age: 82 y.o. Gender: male  Primary Care Provider: Bufford Lope, DO Consultants: None Code Status: Full code  Pt Overview and Major Events to Date:  10/4: Admitted to FMTS under attending, [name] 10/5: CTA found large loculated R pleural effusion, plan to have thoracentesis 10/6- thoracentesis 107- 1u pRBCs, lasix 40mg  IV once  Assessment and Plan: [name] is a 82 y.o. male presenting with fever, shortness of breath. PMH is significant for essential hypertension, CAD, hernia, psoriais, OA, ischemic heart disease, MI, CHF, asthma, dyslipidemia, hx of CABG, depression and GERD.   Acute hypoxic respiratory failure, resolving:  Following thoracentesis he patient required up to 6L o2 and required 1 unit PRBC and 40mg  IV lasix.  Down to 3L yesterday and now on 5L this AM satting at 97% and appears more comfortable.  MEWS score 1.  Repeat CXR showed mild to moderate pleural effusion on R side.   - follow MEWS score - CCM following; appreciate recs - wean O2 to 3L as tolerated - continue management for HCAP as below - started home 40mg  PO lasix  HCAP: on 5L and satting 97%.  CTA: large loculated pleural effusion on right side.  Afebrile >24h. VSS.  BCx NGTD x3d. UCx neg. S/p thoracentesis 10/6.  Fluid hazy/ turbid appearing. WBC elevated 1239. Neutrophils elevated 70%. LDH elevated 178. Protein 3.4. Gram stain no organisms.  No serum LDH, CBC from 10/6 obtained.  However, body fluid labs thus far are suggestive of exudative process.  10/7 CXR loculated right sided pleural effusion similar to CTA performed on 10/5, small left pleural effusion, persistent edema. CXR on 10/8 with increase in patchy airspace opacity in the RUL which may be suggestive for asymmetric pulmonary edema on the R vs superimposed pneumonia. CCM consulted on  10/7 due to worsening status despite thoracentesis, noted this was most likely acute lung injury as either a complication of pneumonia vs reexpansion pulmonary edema.  Has been on vanc/zosyn since 10/4 and doing well.  - D/C vanc/zosyn - Start levoquin 750mg  x5 days - Monitor renal function on Vanc - 50mg  prednisone 5 day burst (10/6>) - Body fluid cx , cytology, pH, TG, Cholesterol pending - f/u ECHO - cardiac monitoring, continuous pulse ox - monitor respiratory status; O2 as needed to maintain sat >90%, wean o2 to 3L as tolerated - trend fevers - OT eval pending  Abdominal pain, very mild on exam: Multifactorial. Could be due to recent surgery (see below) vs pancreatitis. Elevated lipase on admission was 137. Denies abdominal pain this am.  No nausea, vomiting. - continue to monitor - Care discussed with [facility] 10/6: If develops S&S of pancreatitis will make NPO and offer pain medications prn  Anemia Hgb Recent surgeries could be cause of anemia.  Has h/o significant cardiac disease; transfusion threshold <8. S/p 1u pRBCs on 10/7. Hgb 8.9>8.7.  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 Metal stent placement and removal of his internal/external biliary drain.  LFTs slighly elevated on admission and have since normalized.  - continue on vanc/zoysn IV for HCAP   HTN Normotensive.   - continue home regimen of imdur, metoprolol - monitor  Chronic systolic HF Last Echo on [id number] showing normal EF and diastolic dysfunction. Prior echos  have showed G1DD with pEF.  Last followed with cardiology in August.  Lasix 40mg  qd at home per patient.  Received one dose IV yesterday and UOP was 1075cc. Weight down 4 pounds yesterday from 10/6.   - Lasix PO 40mg   - I/Os, daily weights - continue home metoprolol and imdur  Hx of CABG with CAD. HLD He had CABG in 1986.  He had a redo CABG in 1993. He had PCI in 2008 and 2010. 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 Stable.  - Continue daily aspirin 81 mg - Nitroglycerin sublingual if needed for chest pain - not on statin therapy.  Will need to revisit this with patient in the outpatient setting given all his recent GI surgeries.   Asthma Worsened in setting of pneumonia with wheezing on exam.  - Continue Dulera - scheduled duonebs 3x daily - 50mg  prednisone 5 day burst (day 3/5)  Depression - continue home Zoloft 150 mg daily  BPH - continue Flomax 0.4 mg daily and finasteride - monitor UOP  Insomnia - Continue home Melatonin 3 mg - Continue home Trazodone 50 mg daily  A. Flutter S/p pacemaker in 2016. Not on anticoagulation given history of falls and syncope.  - Continue beta blocker.  FEN/GI: Diet heart, SLIV, Ensure supplement bid between meals Prophylaxis: Lovenox sub-q  Disposition: pending medical improvement  Subjective:  Doing well. Patient sitting up in chair and looks comfortable. Denies any chest pain or abdominal pain.    Objective: Temp:  [98 F (36.7 C)-98.5 F (36.9 C)] 98 F (36.7 C) (10/09 0435) Pulse Rate:  [72-74] 72 (10/09 0435) Resp:  [17-18] 18 (10/09 0435) BP: (102-125)/(43-56) 125/56 (10/09 0435) SpO2:  [95 %-97 %] 95 % (10/09 0815) Weight:  [166 lb 6.4 oz (75.5 kg)] 166 lb 6.4 oz (75.5 kg) (10/08 2100) Physical Exam: General: pleasant, elderly white male sitting up in chair, NAD, nasal canula @5L  in place Heart: RRR, no m/r/g, normal s1,s2. +murmur.No obvious gallops, or rubs noted. Palpable radial pulses bilaterally. Pacemaker. Mild JVD.  Lungs: able to speak in full sentences. No increased WOB. Intermittent scattered expiratory wheeze. Bibasilar crackles. [place] in place with 5L O2. Ext: no pitting edema. Abd: soft, nontender, nondistended, +BS Psych: A&Ox3 with an appropriate affect.  Laboratory:  Recent Labs Lab  06/05/16 0551 06/05/16 2221 06/06/16 0520 06/07/16 0512  WBC 7.5  --  7.9 7.0  HGB 7.9* 8.3* 8.9* 8.7*  HCT 26.2* 26.8* 28.5* 27.4*  PLT 533*  --  528* 555*    Recent Labs Lab 06/02/16 1704  06/04/16 0445 06/05/16 0551 06/06/16 0520  NA 136  < > 137 136 135  K 4.2  < > 3.9 3.9 3.8  CL 104  < > 102 103 98*  CO2 25  < > 27 27 29   BUN 19  < > 11 9 12   CREATININE 0.79  < > 0.75 0.65 0.65  CALCIUM 9.6  < > 9.0 8.9 9.0  PROT 7.1  --  5.9*  --  5.8*  BILITOT 0.4  --  0.5  --  0.5  ALKPHOS 279*  --  215*  --  190*  ALT 40  --  30  --  29  AST 56*  --  40  --  38  GLUCOSE 116*  < > 117* 92 100*  < > = values in this interval not displayed. Strep pneumo urinary antigen= negative  Imaging/Diagnostic Tests: Dg Chest 2v Repeat Same  Day  Result Date: 06/05/2016 CLINICAL DATA:  82 year old male with a history of hypertension, prior cardiac surgery and known coronary artery disease. EXAM: CHEST  2 VIEW COMPARISON:  06/04/2016, 06/02/2016, CT 06/03/2016 FINDINGS: Cardiomediastinal silhouette likely unchanged though partially obscured by overlying lung and pleural disease. Surgical changes of prior median sternotomy and CABG. Cardiac pacing device on left chest wall with 2 leads in place, unchanged. Aortic atherosclerosis. Interlobular septal thickening of right greater than left lungs. Blunting of the bilateral costophrenic angles. Opacity in the left than the right base partially obscure the hemidiaphragms and the heart borders. Veil like opacity of the right lung. On the lateral view there is evidence of loculated pleural effusion on the right, with small left-sided pleural effusion. IMPRESSION: Chest x-ray demonstrates partially loculated right-sided pleural effusion, similar to the CT performed 06/03/2016. Small left pleural effusion. Evidence of persisting edema. Aortic atherosclerosis with surgical changes of prior median sternotomy and CABG. Signed, Dulcy Fanny. Earleen Newport, DO [facility] Radiology Electronically Signed   By: Corrie Mckusick D.O.   On: 06/05/2016 10:34   Dg Chest Port 1 View  Result Date: 06/06/2016 CLINICAL DATA:  Hypoxia with acute respiratory failure EXAM: PORTABLE CHEST 1 VIEW COMPARISON:  June 05, 2016 chest radiograph and chest CT June 03, 2016 FINDINGS: There is patchy airspace consolidation throughout much of the right lung, with slight increase in airspace consolidation in the right upper lobe near the apex. Other areas of airspace opacity remains stable. There is a right pleural effusion which may be marginally larger. There is patchy atelectasis in the left base region. Left lung is otherwise clear. There is cardiomegaly with the pulmonary vascularity overall within normal limits. Pacemaker leads are attached to the right atrium and right ventricle. There is atherosclerotic calcification in the aorta. No adenopathy. IMPRESSION: Increase in patchy airspace opacity in the right upper lobe. Airspace opacity elsewhere on the right is stable with small right pleural effusion. There is atelectasis in the left base, stable. Stable cardiac enlargement. Aortic atherosclerosis. Pacemaker leads attached to right atrium and right ventricle. Appearance is most suggestive of asymmetric pulmonary edema on the right. There may well be superimposed pneumonia, particularly given the new opacity in the right upper lobe. Electronically Signed   By: Lowella Grip III M.D.   On: 06/06/2016 09:10    Eloise Levels, MD 06/07/2016, 9:47 AM PGY-1, [place] Intern pager: [phone], text pages welcome

## 2016-06-07 NOTE — Progress Notes (Signed)
PULMONARY / CRITICAL CARE MEDICINE   Name: [name] MRN: [id number] DOB: 1924-09-08    ADMISSION DATE:  06/02/2016 CONSULTATION DATE:  06/05/16  REFERRING MD:  FPTS Dr Lajuana Ripple   CHIEF COMPLAINT:  Worsening acute resp hypoxemic resp failure briuef - History from the patient, family practice intern Dr. 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 He started having spiking fevers since his surgical procedure. Gastric oncology services did not think the gallbladder was a source of the fever. He presented to family practice office and was admitted 06/02/2016. He had a normal lactic acid but elevated white count at admission. Chest x-ray showed worsening right basilar opacification and infiltrates with a small effusion. Despite treatment with IV antibiotics and hydration he continued to worsen. CT chest 06/03/2016 showed large right-sided pleural effusion. He underwent thoracentesis 06/04/2016 and had 1.11 L of amber color exudate removed but he continued to worsen now requiring 6 L of oxygen. Therefore pulmonary has been consulted. Currently is nontoxic but requiring high oxygen without high work of breathing. His MEWS score at this point is 1. Chest x-ray shows clearance of the right-sided effusion but persistent right lower lobe and left lower lobe pulmonary infiltrate with a right lower lobe being greater. He has been afebrile since admission with a normal white count.   EVENTS 06/02/2016 - admit 10/4: Admitted to FMTS under attending, [name] 10/5: CTA found large loculated R pleural effusion, plan to have thoracentesis 10/6- thoracentesis 107- 1u pRBCs, lasix 40mg  IV once and pccm consult   SUBJECTIVE/OVERNIGHT/INTERVAL HX Feeling  better.  Wears 3L O2 at home.    VITAL SIGNS: BP (!) 129/58 (BP Location: Right Arm)   Pulse 66   Temp 98.2 F (36.8 C) (Oral)   Resp 18   Ht 5\' 10"  (1.778 m)   Wt 75.5 kg (166 lb 6.4 oz)   SpO2 96%   BMI 23.88 kg/m      HEMODYNAMICS:    VENTILATOR SETTINGS:    INTAKE / OUTPUT: I/O last 3 completed shifts: In: 1197 [P.O.:897; IV Piggyback:300] Out: 1075 [Urine:1075]  PHYSICAL EXAMINATION: General:  Deconditioned elderly male lying in bed Neuro:  Fairly alert and oriented 3. Speech normal HEENT:  Nasal cannula oxygen on. No elevated JVP no neck nodes Cardiovascular:  Regular rate and rhythm. Normal cardiac Lungs:  Basal crackles present but no increased work of breathing Abdomen:  Soft nontender Musculoskeletal:  No edema no sinus no clubbing Skin:  Appears intact in the exposed areas  LABS:  BMET  Recent Labs Lab 06/05/16 0551 06/06/16 0520 06/07/16 0616  NA 136 135 134*  K 3.9 3.8 3.5  CL 103 98* 99*  CO2 27 29 29   BUN 9 12 14   CREATININE 0.65 0.65 0.65  GLUCOSE 92 100* 111*    Electrolytes  Recent Labs Lab 06/05/16 0551 06/06/16 0520 06/07/16 0616  CALCIUM 8.9 9.0 8.9    CBC  Recent Labs Lab 06/05/16 0551 06/05/16 2221 06/06/16 0520 06/07/16 0512  WBC 7.5  --  7.9 7.0  HGB 7.9* 8.3* 8.9* 8.7*  HCT 26.2* 26.8* 28.5* 27.4*  PLT 533*  --  528* 555*   No results for input(s): PROCALCITON in the last 168 hours.   Coag's  Recent Labs  Lab 06/01/16 0823  APTT 36  INR 1.46    Sepsis Markers  Recent Labs Lab 06/02/16 1717 06/02/16 2052  LATICACIDVEN 1.16 1.23    ABG No results for input(s): PHART, PCO2ART, PO2ART in the last 168 hours.  Liver Enzymes  Recent Labs Lab 06/02/16 1704 06/03/16 1659 06/04/16 0445 06/06/16 0520  AST 56*  --  40 38  ALT 40  --  30 29  ALKPHOS 279*  --  215* 190*  BILITOT 0.4  --  0.5 0.5  ALBUMIN 2.2* 1.8* 1.7* 1.6*    Cardiac Enzymes No results for input(s): TROPONINI, PROBNP in  the last 168 hours.  Glucose No results for input(s): GLUCAP in the last 168 hours.  ImagingPersonally visualized Dg Chest Port 1 View  Result Date: 06/07/2016 CLINICAL DATA:  Shortness of breath EXAM: PORTABLE CHEST 1 VIEW COMPARISON:  06/06/2016 FINDINGS: Vague opacity in the right midlung, likely fluid in the fissure. Small to moderate right pleural effusion. Cardiomegaly. Bibasilar atelectasis. Prior CABG. Left pacer is unchanged. IMPRESSION: Small to moderate right pleural effusion with probable fluid in the fissure. Cardiomegaly. Bibasilar atelectasis. Electronically Signed   By: Rolm Baptise M.D.   On: 06/07/2016 10:03   I reviewed CXR myself, right sided pleural effusion noted.  ASSESSMENT / PLAN:  PULMONARY A: Acute on chronic hypoxemic respiratory failure r/t HCAP with likely parapneumonic effusion +/- ALI v pulmonary edema (re-expansion v acute on chronic dCHF). Improving slowly.  Subjectively better.  Afebrile.  Remains on 5L [place], comfortable.  Pleural effusion - likely parapneumonic.  Thoracentesis shows nonspecific exudate   P:   Continue abx - vanc/zosyn for HCAP  Titrate O2 to keep sats 88-92% - baseline home O2 of 3liters, attempt to get to that prior to discharge otherwise will need a repeat titration Pulmonary hygiene - flutter, incentive and ambulate. Mobilize  F/u CXR PRN at this point Follow pleural fluid culture - neg to date   FAMILY  - Updates: Wife and patient updated at the bedside 10/9.  Discussed with PCCM-NP.  PCCM will sign off, please call back if needed.  Rush Farmer, M.D. [facility] Pulmonary/Critical Care Medicine. Pager: [phone]. After hours pager: [phone].

## 2016-06-07 NOTE — Progress Notes (Signed)
Nutrition Follow-up  DOCUMENTATION CODES:   Non-severe (moderate) malnutrition in context of chronic illness  INTERVENTION:  Continue Ensure Enlive po BID, each supplement provides 350 kcal and 20 grams of protein.  Encourage adequate PO intake.   NUTRITION DIAGNOSIS:   Increased nutrient needs related to chronic illness as evidenced by estimated needs; ongoing  GOAL:   Patient will meet greater than or equal to 90% of their needs; met  MONITOR:   PO intake, Supplement acceptance, Labs, Weight trends, Skin, I & O's  REASON FOR ASSESSMENT:   Malnutrition Screening Tool    ASSESSMENT:   82 y.o. male presenting with fever, shortness of breath. PMH is significant for essential hypertension, CAD, hernia, psoriais, OA, ischemic heart disease, MI, CHF, asthma, dyslipidemia, hx of CABG, depression and GERD.   Meal completion has been 25-100% with po intake 75-100%. Pt reports appetite has been improving since admission. Pt reports a lack of appetite PTA however would still try to consuming 3 meals a day with Ensure shakes 4 times daily. Pt currently has Ensure ordered and has been consuming them. RD to continue with current orders. Pt encouraged to eat his food at meals and to drink his supplements.   Nutrition-Focused physical exam completed. Findings are moderate fat depletion, moderate muscle depletion, and no edema.   Labs and medications reviewed.   Diet Order:  Diet Heart Room service appropriate? Yes; Fluid consistency: Thin  Skin:   (Incision on abdomen)  Last BM:  10/9  Height:   Ht Readings from Last 1 Encounters:  06/03/16 _0  (1.778 m)    Weight:   Wt Readings from Last 1 Encounters:  06/06/16 166 lb 6.4 oz (75.5 kg)    Ideal Body Weight:  75.45 kg  BMI:  Body mass index is 23.88 kg/m.  Estimated Nutritional Needs:   Kcal:  1700-1900  Protein:  75-90 grams  Fluid:  1.7 - 1.9 L/day  EDUCATION NEEDS:   No education needs identified at this  time  Corrin Parker, MS, RD, LDN Pager # [phone] After hours/ weekend pager # [phone]

## 2016-06-08 ENCOUNTER — Inpatient Hospital Stay (HOSPITAL_COMMUNITY): Payer: Medicare Other

## 2016-06-08 DIAGNOSIS — J80 Acute respiratory distress syndrome: Secondary | ICD-10-CM

## 2016-06-08 DIAGNOSIS — I2510 Atherosclerotic heart disease of native coronary artery without angina pectoris: Secondary | ICD-10-CM

## 2016-06-08 LAB — ECHOCARDIOGRAM COMPLETE
HEIGHTINCHES: 70 in
Weight: 2656.1 oz

## 2016-06-08 LAB — CBC
HCT: 27.3 % — ABNORMAL LOW (ref 39.0–52.0)
Hemoglobin: 8.5 g/dL — ABNORMAL LOW (ref 13.0–17.0)
MCH: 27.7 pg (ref 26.0–34.0)
MCHC: 31.1 g/dL (ref 30.0–36.0)
MCV: 88.9 fL (ref 78.0–100.0)
PLATELETS: 531 10*3/uL — AB (ref 150–400)
RBC: 3.07 MIL/uL — ABNORMAL LOW (ref 4.22–5.81)
RDW: 15.7 % — AB (ref 11.5–15.5)
WBC: 7 10*3/uL (ref 4.0–10.5)

## 2016-06-08 LAB — CHOLESTEROL, BODY FLUID: Cholesterol, Fluid: 50 mg/dL

## 2016-06-08 MED ORDER — LEVOFLOXACIN 750 MG PO TABS: 750.0000 mg | ORAL_TABLET | ORAL | 0 refills | Status: DC

## 2016-06-08 NOTE — Progress Notes (Signed)
*  PRELIMINARY RESULTS* Echocardiogram 2D Echocardiogram has been performed.  [name] 06/08/2016, 2:17 PM

## 2016-06-08 NOTE — Progress Notes (Signed)
Family Medicine Teaching Service Daily Progress Note Intern Pager: [phone]  Patient name: [name] Medical record number: [id number] Date of birth: 08-Nov-1924 Age: 82 y.o. Gender: male  Primary Care Provider: Bufford Lope, DO Consultants: None Code Status: Full code  Pt Overview and Major Events to Date:  10/4: Admitted to FMTS under attending, [name] 10/5: CTA found large loculated R pleural effusion, plan to have thoracentesis 10/6- thoracentesis 107- 1u pRBCs, lasix 40mg  IV once  Assessment and Plan: [name] is a 82 y.o. male presenting with fever, shortness of breath. PMH is significant for essential hypertension, CAD, hernia, psoriais, OA, ischemic heart disease, MI, CHF, asthma, dyslipidemia, hx of CABG, depression and GERD.   Acute hypoxic respiratory failure, resolved:  Following thoracentesis the patient required up to 6L o2 and required 1 unit PRBC and 40mg  IV lasix.  Repeat CXR yesterday showed mild to moderate pleural effusion on R side.  Now on 3L satting 97% and appearing comfortable.  - follow MEWS score - CCM following; appreciate recs - continue management for HCAP as below - continue home 40mg  PO lasix  HCAP, improving: on 3L and satting 97%.  CTA: large loculated pleural effusion on right side.  Afebrile >24h. VSS.  BCx: negative. UCx: neg. thoracentesis body fluid: NGTDx3 days. CCM consulted on 10/7 due to worsening status despite thoracentesis, noted this was most likely acute lung injury as either a complication of pneumonia vs reexpansion pulmonary edema. Now on day 2 levoquin and back on 3L, doing well.  Still decreased air movement on R side compared to L side.  Patient seen by PT recommended SNF or HHPT.  Patient not interested in SNF.   - D/C vanc/zosyn - Continue levoquin 750mg  (day 2/5) - Monitor renal function on Vanc - 50mg  prednisone 5 day burst last day - Body fluid cx , cytology, pH, TG, Cholesterol pending - f/u ECHO - cardiac monitoring,  continuous pulse ox - monitor respiratory status; O2 as needed to maintain sat >90% - trend fevers - OT eval pending  Abdominal pain, improved: Multifactorial. Could be due to recent surgery (see below) vs pancreatitis. Elevated lipase on admission was 137. Denies abdominal pain this am.  No nausea, vomiting. - continue to monitor - Care discussed with [name] GI 10/6: If develops S&S of pancreatitis will make NPO and offer pain medications prn  Anemia Hgb, stable: Recent surgeries could be cause of anemia.  Has h/o significant cardiac disease; transfusion threshold <8. S/p 1u pRBCs on 10/7. 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 Metal stent placement and removal of his internal/external biliary drain.  LFTs slighly elevated on admission and have since normalized.  - continue on vanc/zoysn IV for HCAP   HTN, stable:  Normotensive.   - continue home regimen of imdur, metoprolol - monitor  Chronic systolic HF, stable: Last Echo on 03/2016 showing normal EF and diastolic dysfunction. Prior echos have showed G1DD with pEF.  Last followed with cardiology in August.  Lasix 40mg  qd at home per patient.  Now on home lasix 40mg  and  I/O: 880/ 1020. - continue Lasix PO 40mg   - I/Os, daily weights - continue home metoprolol and imdur  Hx of CABG with CAD. HLD, stable:  He had CABG in 1986. He had a redo CABG in 1993. He had PCI in 2008 and 2010. 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 Stable.  - Continue daily aspirin 81 mg - Nitroglycerin sublingual if needed for chest pain - not on statin therapy.  Will need to revisit this with patient in the outpatient setting given all his recent GI surgeries.   Asthma, stable:  Worsened in setting of pneumonia with wheezing on exam.  - Continue Dulera - scheduled duonebs 3x daily -  50mg  prednisone 5 day burst (day 5/5)  Depression, stable: - continue home Zoloft 150 mg daily  BPH, stable:  - continue Flomax 0.4 mg daily and finasteride - monitor UOP  Insomnia, stable: - Continue home Melatonin 3 mg  - Continue home Trazodone 50 mg daily  A. Flutter, stable: S/p pacemaker in 2016. Not on anticoagulation given history of falls and syncope.  - Continue beta blocker.  FEN/GI: Diet heart, SLIV, Ensure supplement bid between meals Prophylaxis: Lovenox sub-q  Disposition: pending medical improvement  Subjective:  Doing well. Patient sitting up in chair and looks comfortable. Denies any chest pain or abdominal pain.  Did not sleep well last night.  Ready to go home.  Does NOT want to go to SNF.   Objective: Temp:  [97.8 F (36.6 C)-98.1 F (36.7 C)] 98.1 F (36.7 C) (10/10 0422) Pulse Rate:  [67-80] 77 (10/10 0956) Resp:  [16-18] 16 (10/10 0422) BP: (124-137)/(51-56) 124/56 (10/10 0956) SpO2:  [95 %-97 %] 96 % (10/10 0725) Weight:  [166 lb (75.3 kg)-166 lb 0.1 oz (75.3 kg)] 166 lb 0.1 oz (75.3 kg) (10/10 0500) Physical Exam: General: pleasant, elderly white male sitting up in chair, NAD, nasal canula @3L  in place Heart: RRR, no m/r/g, normal s1,s2. +murmur.No obvious gallops, or rubs noted. Palpable radial pulses bilaterally. Pacemaker. Mild JVD.  Lungs: able to speak in full sentences. No increased WOB. Intermittent scattered expiratory wheeze. Bibasilar crackles. Verdel in place with 3L O2.   Ext: no pitting edema. Abd: soft, nontender, nondistended, +BS Psych: A&Ox3 with an appropriate affect.  Laboratory:  Recent Labs Lab 06/06/16 0520 06/07/16 0512 06/08/16 0507  WBC 7.9 7.0 7.0  HGB 8.9* 8.7* 8.5*  HCT 28.5* 27.4* 27.3*  PLT 528* 555* 531*    Recent Labs Lab 06/02/16 1704  06/04/16 0445 06/05/16 0551 06/06/16 0520 06/07/16 0616  NA 136  < > 137 136 135 134*  K 4.2  < > 3.9 3.9 3.8 3.5  CL 104  < > 102 103 98* 99*  CO2 25  < >  27 27 29 29   BUN 19  < > 11 9 12 14   CREATININE 0.79  < > 0.75 0.65 0.65 0.65  CALCIUM 9.6  < > 9.0 8.9 9.0 8.9  PROT 7.1  --  5.9*  --  5.8*  --   BILITOT 0.4  --  0.5  --  0.5  --   ALKPHOS 279*  --  215*  --  190*  --   ALT 40  --  30  --  29  --   AST 56*  --  40  --  38  --   GLUCOSE 116*  < > 117* 92 100* 111*  < > = values in this interval not displayed. Strep pneumo urinary antigen= negative  Imaging/Diagnostic Tests: Dg Chest Port 1 View  Result Date: 06/07/2016 CLINICAL DATA:  Shortness of breath EXAM: PORTABLE CHEST 1 VIEW COMPARISON:  06/06/2016 FINDINGS: Vague opacity in the right midlung, likely fluid in the fissure. Small to moderate right pleural effusion. Cardiomegaly.  Bibasilar atelectasis. Prior CABG. Left pacer is unchanged. IMPRESSION: Small to moderate right pleural effusion with probable fluid in the fissure. Cardiomegaly. Bibasilar atelectasis. Electronically Signed   By: Rolm Baptise M.D.   On: 06/07/2016 10:03    Eloise Levels, MD 06/08/2016, 1:13 PM PGY-1, [place] Intern pager: [phone], text pages welcome

## 2016-06-08 NOTE — Care Management Note (Addendum)
Case Management Note  Patient Details  Name: [name] MRN: [id number] Date of Birth: 12-01-1924  Subjective/Objective:     CM following for progression and d/c planning.               Action/Plan: 06/08/16 Met with pt and wife, pt wishes to continue services with [facility] for [facility] services. HHRN, HHPT, HHOT and aide requested and order placed to resume. Pt is also receiving HRI ( high risk services) due to chronic disease. Pt wife states that she has an oxygen tank in the car to use for transport to home and pt receives oxygen from [facility].  Pt requesting mask for use with home nebulizer. This CM advised pt to call neb provider, most likely [facility] and request a mask, in the meantime pt advised to take home the mask he has been using at the hospital to try with his nebulizer. The pt is also interested in obtaining the small portable tanks for oxygen when he is out of the home. Pt was again advised to contact [place] as they are his oxygen provider and will arrange of the small tanks after an evaluation. This CM did place a call to the hospital [facility] equipment reps advising them of pt needs.   Expected Discharge Date:    06/09/2016              Expected Discharge Plan:  [place]  In-House Referral:  NA  Discharge planning Services  CM Consult  Post Acute Care Choice:  Home Health Choice offered to:  Patient  DME Arranged:   NA DME Agency:   NA    HH Arranged:  RN, PT, OT, Nurse's Aide, Social Work [employer] Agency:  [place]  Status of Service:  Completed, signed off  If discussed at [employer], dates discussed:    Additional Comments:  Adron Bene, RN 06/08/2016, 11:10 AM

## 2016-06-08 NOTE — Progress Notes (Signed)
Patient discharged home per MD. Discharge instructions reviewed with patient and family. Case manager arranged to get tank with regulator from [place] for transport home. Family to pick up levaquin prescription from [facility]. Bartholomew Crews, RN

## 2016-06-08 NOTE — Discharge Instructions (Signed)
You were admitted to the hospital for fever and shortness of breath and were being treated for a pneumonia.  You briefly required additional oxygen, but you are back on 3L and are comfortable again.  You are doing well on levoquin (antibiotic right now) and we will send you home with this medication.  You will continue to take this for 3 more days.  We will also send you home with physical therapy.

## 2016-06-08 NOTE — Progress Notes (Signed)
Occupational Therapy Treatment Patient Details Name: [name] MRN: [id number] DOB: [birth year] Today's Date: 06/08/2016    History of present illness Shane A Weisneris a 82 y.o.malepresenting with fever, shortness of breath.  Patient with Rt pleural effusion and s/p thoracentesis 06/04/16.     PMH is significant for essential hypertension, CAD, hernia, psoriais, OA, ischemic heart disease, MI, CHF, asthma, dyslipidemia, hx of CABG, depression and GERD.    OT comments  Pt demonstrated ability to perform toileting, standing grooming and LB ADL with supervision to min assist. Pt with good activity tolerance for 10 minutes of standing at sink with VSS on 3L 02. Pt has decided to return home with home health therapies. He is hopeful to go home today.  Follow Up Recommendations  Home health OT    Equipment Recommendations  None recommended by OT    Recommendations for Other Services      Precautions / Restrictions Precautions Precautions: Fall Precaution Comments: o2, R foot drop       Mobility Bed Mobility               General bed mobility comments: in chair  Transfers Overall transfer level: Needs assistance Equipment used: Rolling walker (2 wheeled) Transfers: Sit to/from Stand Sit to Stand: Min guard         General transfer comment: good technique, supervision for safety    Balance     Sitting balance-Leahy Scale: Good       Standing balance-Leahy Scale: Poor                     ADL Overall ADL's : Needs assistance/impaired     Grooming: Oral care;Wash/dry hands;Wash/dry face;Standing;Supervision/safety (cleaned glasses) Grooming Details (indicate cue type and reason): stood x 10 minutes     Lower Body Bathing: Minimal assistance;Sitting/lateral leans Lower Body Bathing Details (indicate cue type and reason): washed feet     Lower Body Dressing: Minimal assistance;Sitting/lateral leans Lower Body Dressing Details (indicate cue type  and reason): socks Toilet Transfer: Min guard;Ambulation;RW;BSC   Toileting- Water quality scientist and Hygiene: Min guard;Sit to/from stand       Functional mobility during ADLs: Min guard;Rolling walker General ADL Comments: Assist with 02 line and pulse ox with ambulation.      Vision                     Perception     Praxis      Cognition   Behavior During Therapy: WFL for tasks assessed/performed Overall Cognitive Status: Within Functional Limits for tasks assessed                       Extremity/Trunk Assessment               Exercises     Shoulder Instructions       General Comments      Pertinent Vitals/ Pain       Pain Assessment: No/denies pain  Home Living                                          Prior Functioning/Environment              Frequency  Min 2X/week        Progress Toward Goals  OT Goals(current goals can now be found in the care plan  section)  Progress towards OT goals: Progressing toward goals  Acute Rehab OT Goals Patient Stated Goal: go home today and sleep Time For Goal Achievement: 06/18/16 Potential to Achieve Goals: Good  Plan Discharge plan needs to be updated    Co-evaluation                 End of Session Equipment Utilized During Treatment: Rolling walker;Gait belt;Oxygen (3L)   Activity Tolerance Patient tolerated treatment well   Patient Left in chair;with call bell/phone within reach;with family/visitor present   Nurse Communication          Time: 1020-1107 OT Time Calculation (min): 47 min  Charges: OT General Charges $OT Visit: 1 Procedure OT Treatments $Self Care/Home Management : 38-52 mins  Malka So 06/08/2016, 11:14 AM  [phone]

## 2016-06-08 NOTE — Discharge Summary (Signed)
[facility] Discharge Summary  Patient name: [name] Medical record number: [id number] Date of birth: April 05, 1925 Age: 82 y.o. Gender: male Date of Admission: 06/02/2016  Date of Discharge: 06/08/2016 Admitting Physician: Dickie La, MD  Primary Care Provider: Bufford Lope, DO Consultants: CCM Code Status: Full code  Indication for Hospitalization:  Fever, shortness of breath  Discharge Diagnoses/Problem List:  Patient Active Problem List   Diagnosis Date Noted  . Acute on chronic respiratory failure with hypoxemia ([place]) 06/05/2016  . Loculated pleural effusion   . Pleural effusion on right   . Cough 06/02/2016  . HCAP (healthcare-associated pneumonia) 06/02/2016  . Chronic anemia   . Chronic systolic congestive heart failure ([place])   . Chronic bronchitis ([place])   . Common bile duct mass   . Fever 05/26/2016  . Pyrexia   . Biliary drain displacement   . 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 Cholangitis   . Peri-ampullary neoplasm   . Malnutrition of moderate degree 04/29/2016  . Common bile duct obstruction   . Elevated liver function tests   . Bacteremia due to Gram-negative bacteria 04/27/2016  . BPH (benign prostatic hyperplasia)   . Asthma, mild intermittent   . Insomnia 02/18/2016  . SOB (shortness of breath)   . Pacemaker 12/25/2015  . Atrial flutter, RVR 04/10/2015  . Unstable angina ([place]) 04/10/2015  . Coronary artery disease involving native coronary artery of native heart without angina pectoris 04/10/2015  . Urinary retention due to benign prostatic hyperplasia   . Systolic CHF ([place]) [id number]  . Depression 09/26/2013  . Intertrochanteric fracture of right hip ([place]) 08/29/2013  . Hypertension   . GERD (gastroesophageal reflux disease)   . COPD (chronic obstructive pulmonary disease) ([place])   . H/O cardiac arrest   . Junctional bradycardia 07/25/2012  . Glucose intolerance (impaired glucose tolerance) 07/19/2012  . Normocytic  anemia 07/19/2012  . Acute MI, anterolateral wall ([place]) 07/16/2012  . Prostatitis 07/16/2012  . E. coli UTI (urinary tract infection) 07/16/2012  . Elevated transaminase level 07/16/2012  . Foot drop, right 07/28/2011  . LBBB (left bundle branch block) 03/29/2011  . Hx of CABG and re-do CABG  03/29/2011  . Ischemic heart disease   . Myocardial infarction   . Acute systolic congestive heart failure, NYHA class 2-EF 40-45%   . Chronic respiratory failure ([place])   . Chronic asthma   . Peripheral neuropathy ([place])   . Dyslipidemia (high LDL; low HDL)   . Essential hypertension 04/04/2007  . PSORIASIS 04/04/2007  . OSTEOARTHRITIS 04/04/2007  . OSTEOPOROSIS 04/04/2007  . Coronary artery disease due to lipid rich plaque 01/05/2007  . HERNIA 01/05/2007    Disposition: Discharge home, with home PT  Discharge Condition: Stable, improved  Discharge Exam:  General: pleasant, elderly white male sitting up in chair, NAD, nasal canula @3L  in place Heart: RRR, no m/r/g, normal s1,s2. +murmur.No obvious gallops, or rubs noted. Palpable radial pulses bilaterally. Pacemaker. Mild JVD.  Lungs: able to speak in full sentences. No increased WOB. Intermittent scattered expiratory wheeze. Bibasilar crackles. [place] in place with 3L O2.   Ext: no pitting edema. Abd: soft, nontender, nondistended, +BS Psych: A&Ox3 with an appropriate affect.  Brief Hospital Course:  82 year old male with history of chronic systolic heart failure on 3 L home O2 who is recently status post biliary stent placement and presented after spiking fevers and had associated shortness of breath.  Upon presentation he had normal lactic acid, however elevated white blood  cell count.  Chest x-ray showed worsening right right basilar opacification and infiltrates with a small effusion.  Given patient's history of being on 3 L of oxygen and having asthma, he was started on a 5 day 50 mg prednisone burst . There was concern for pulmonary  embolism and patient had elevated d-dimer and underwent CTA that showed large right loculated pleural effusion.  He underwent thoracentesis on 06/04/2016 which yielded 1.1 L of amber color exudate.  Following sleep thoracentesis patient required up to 6 L O2 and required 1 unit packed red blood cells and 40 mg IV Lasix 1.  Repeat chest x-ray showed clearance of the right-sided effusion, but persistent right lower lobe and left lower lobe pulmonary infiltrate with right lower lobe being greater.  Body fluid showed no growth.  Patient was seen by physical therapy during admission who recommended skilled nursing facility, patient refused and wanted home health physical therapy.  Also seen by occupational therapy who recommended home health OT. At the time of discharge patient was back on 3 L of oxygen and was comfortable and satting appropriately.  Patient was sent home with instructions to take 3 more days of levofloxacin to treat his pneumonia, and to follow up with his oxygen supplier to receive a portable oxygen tank and to follow up with manufacturer of his nebulizer to receive a new mask.    Issues for Follow Up:  1. Pneumonia: Patient diagnosed with HCAP and was treated with levofloxacin and discharged with instructions to take 3 more days and to follow with primary care doctor.  2. Anemia: Patient required 1 unit packed red blood cells on 06/05/2016, would be appropriate to check CBC at next visit 3. Abdominal pain: Patient is status post biliary stent, and had abdominal pain in the right upper quadrant during admission. Follow-up this issue at next visit. 4. Oxygen: Follow-up patient's need for nebulizer mask and possible oxygen tank.   Significant Procedures: Thoracentesis- yielding 1.1 L of amber color exudate.   Significant Labs and Imaging:   Recent Labs Lab 06/06/16 0520 06/07/16 0512 06/08/16 0507  WBC 7.9 7.0 7.0  HGB 8.9* 8.7* 8.5*  HCT 28.5* 27.4* 27.3*  PLT 528* 555* 531*     Recent Labs Lab 06/05/16 0551 06/06/16 0520 06/07/16 0616  NA 136 135 134*  K 3.9 3.8 3.5  CL 103 98* 99*  CO2 27 29 29   GLUCOSE 92 100* 111*  BUN 9 12 14   CREATININE 0.65 0.65 0.65  CALCIUM 8.9 9.0 8.9  ALKPHOS  --  190*  --   AST  --  38  --   ALT  --  29  --   ALBUMIN  --  1.6*  --    UCX: negative BCX: negative Body cultures: negative  Results/Tests Pending at Time of Discharge: None  Discharge Medications:    Medication List    STOP taking these medications   ciprofloxacin 750 MG tablet Commonly known as:  CIPRO   metroNIDAZOLE 500 MG tablet Commonly known as:  FLAGYL     TAKE these medications   acetaminophen 500 MG tablet Commonly known as:  TYLENOL Take 1,000 mg by mouth every 6 (six) hours as needed for fever.   albuterol 108 (90 Base) MCG/ACT inhaler Commonly known as:  PROVENTIL HFA;VENTOLIN HFA Inhale 2 puffs into the lungs every 6 (six) hours as needed for wheezing or shortness of breath.   aspirin 81 MG EC tablet Take 1 tablet (81 mg total) by  mouth daily.   budesonide-formoterol 80-4.5 MCG/ACT inhaler Commonly known as:  SYMBICORT Inhale 2 puffs into the lungs 2 (two) times daily.   CALTRATE GUMMY BITES 250-400 MG-UNIT Chew Generic drug:  Ca Phosphate-Cholecalciferol Chew 2 tablets by mouth 2 (two) times daily.   carbamazepine 100 MG chewable tablet Commonly known as:  TEGRETOL Chew 300 mg by mouth 2 (two) times daily.   docusate sodium 100 MG capsule Commonly known as:  COLACE Take 100 mg by mouth daily.   feeding supplement (ENSURE ENLIVE) Liqd Take 237 mLs by mouth 2 (two) times daily between meals.   feeding supplement (PRO-STAT SUGAR FREE 64) Liqd Take 30 mLs by mouth 2 (two) times daily.   finasteride 5 MG tablet Commonly known as:  PROSCAR Take 5 mg by mouth every morning.   ipratropium 0.02 % nebulizer solution Commonly known as:  ATROVENT Take 2.5 mLs (0.5 mg total) by nebulization 2 (two) times daily as needed  for wheezing or shortness of breath.   ipratropium 17 MCG/ACT inhaler Commonly known as:  ATROVENT HFA Inhale 2 puffs into the lungs 2 (two) times daily as needed for wheezing.   ipratropium-albuterol 0.5-2.5 (3) MG/3ML Soln Commonly known as:  DUONEB Take 3 mLs by nebulization every 6 (six) hours as needed. What changed:  reasons to take this   isosorbide mononitrate 30 MG 24 hr tablet Commonly known as:  IMDUR Take 1 tablet (30 mg total) by mouth daily.   levofloxacin 750 MG tablet Commonly known as:  LEVAQUIN Take 1 tablet (750 mg total) by mouth daily.   Melatonin 3 MG Tabs Take 3 mg by mouth at bedtime.   metoprolol succinate 12.5 mg Tb24 24 hr tablet Commonly known as:  TOPROL-XL Take 12.5 mg by mouth daily.   nitroGLYCERIN 0.4 MG SL tablet Commonly known as:  NITROSTAT Place 1 tablet (0.4 mg total) under the tongue every 5 (five) minutes as needed for chest pain (As needed for chest pain).   OXYGEN Inhale 3 L/min into the lungs continuous.   potassium chloride SA 20 MEQ tablet Commonly known as:  K-DUR,KLOR-CON Take 20 mEq by mouth daily.   ranitidine 150 MG tablet Commonly known as:  ZANTAC TAKE 1 TABLET BY MOUTH 2 TIMES DAILY. What changed:  See the new instructions.   senna 8.6 MG tablet Commonly known as:  SENOKOT Take 1 tablet by mouth daily.   sertraline 100 MG tablet Commonly known as:  ZOLOFT Take 150 mg by mouth every morning.   tamsulosin 0.4 MG Caps capsule Commonly known as:  FLOMAX Take 1 capsule (0.4 mg total) by mouth daily.   tiZANidine 2 MG tablet Commonly known as:  ZANAFLEX Take 1 tablet (2 mg total) by mouth every 8 (eight) hours as needed for muscle spasms.   traZODone 50 MG tablet Commonly known as:  DESYREL Take 1 tablet (50 mg total) by mouth at bedtime.       Discharge Instructions: Please refer to Patient Instructions section of EMR for full details.  Patient was counseled important signs and symptoms that should prompt  return to medical care, changes in medications, dietary instructions, activity restrictions, and follow up appointments.   Follow-Up Appointments: Follow-up with primary care doctor in 1-2 weeks  Eloise Levels, MD 06/11/2016, 9:03 PM PGY-1, [place]

## 2016-06-09 ENCOUNTER — Encounter: Payer: Self-pay | Admitting: Family Medicine

## 2016-06-09 LAB — CULTURE, BODY FLUID-BOTTLE: CULTURE: NO GROWTH

## 2016-06-09 LAB — CULTURE, BODY FLUID W GRAM STAIN -BOTTLE

## 2016-06-10 ENCOUNTER — Telehealth: Payer: Self-pay | Admitting: Family Medicine

## 2016-06-10 NOTE — Telephone Encounter (Signed)
Pt is calling because he has been released from the hospital and was suppose to have home health care. He called and they said that no orders have been put in at this time. Can we send in orders for him. Not sure if he is using [facility], [facility], or another place. He could not remember who he called . jw

## 2016-06-10 NOTE — Telephone Encounter (Signed)
Will forward to MD to give verbal ok for these orders. Yavuz Kirby,CMA

## 2016-06-10 NOTE — Telephone Encounter (Signed)
Maggie from [facility] is wanting to get order for OT therapy, Physical therapy and a home health aide. Please advise. Thanks! ep

## 2016-06-14 ENCOUNTER — Telehealth: Payer: Self-pay | Admitting: Family Medicine

## 2016-06-14 NOTE — Telephone Encounter (Signed)
Henderson Newcomer from [facility] called and is requesting verbal orders to continue home OT for the patient. 1 time1 week. 2 times week 3 . Please call with verbal order and speak to Manuela Schwartz at [phone]. Blima Rich

## 2016-06-14 NOTE — Telephone Encounter (Signed)
Will forward to MD for verbal ok and then I will call Manuela Schwartz with them. Damaris Abeln,CMA

## 2016-06-15 NOTE — Telephone Encounter (Signed)
Yes please. Thank you

## 2016-06-15 NOTE — Telephone Encounter (Signed)
LM for susan on identified voicemail. Jazmin Hartsell,CMA

## 2016-06-18 ENCOUNTER — Encounter: Payer: Self-pay | Admitting: Family Medicine

## 2016-06-19 ENCOUNTER — Other Ambulatory Visit: Payer: Self-pay | Admitting: Family Medicine

## 2016-06-19 DIAGNOSIS — J9621 Acute and chronic respiratory failure with hypoxia: Secondary | ICD-10-CM

## 2016-06-21 ENCOUNTER — Encounter: Payer: Self-pay | Admitting: Family Medicine

## 2016-06-21 ENCOUNTER — Ambulatory Visit (INDEPENDENT_AMBULATORY_CARE_PROVIDER_SITE_OTHER): Payer: Medicare Other | Admitting: Family Medicine

## 2016-06-21 VITALS — BP 130/58 | HR 63 | Temp 97.7°F | Ht 71.0 in | Wt 151.0 lb

## 2016-06-21 DIAGNOSIS — K219 Gastro-esophageal reflux disease without esophagitis: Secondary | ICD-10-CM | POA: Diagnosis not present

## 2016-06-21 DIAGNOSIS — J9621 Acute and chronic respiratory failure with hypoxia: Secondary | ICD-10-CM | POA: Diagnosis not present

## 2016-06-21 MED ORDER — RANITIDINE HCL 150 MG PO TABS: 150.0000 mg | ORAL_TABLET | Freq: Two times a day (BID) | ORAL | 3 refills | Status: AC

## 2016-06-21 NOTE — Patient Instructions (Addendum)
It was great to see you again!  For your respiratory issue,  - Please continue to stay on 3L O2 until you see pulmonology - I have placed a referral to Dr. Gustavus Bryant office, they should contact you but if you have not heard from them 2 weeks please let me know.  For your indigestion, - Please start taking zantac twice a day, I think it will really help with the symptoms and that way you might not need to use tums as often  For your abdominal mass - Please keep your scheduled follow up on November 20.  Your labs looked good with a stable hemoglobin at 8.5 from previous 8.7.  Take care and seek immediate care sooner if you develop any concerns.   Dr. Bufford Lope, [place]

## 2016-06-22 NOTE — Progress Notes (Signed)
HPI: FU CAD. He had CABG in 1986. He had a redo CABG in 1993. He had PCI in 2008 and 2010. He presented to the emergency room on 07/15/12 after falling at home. While in the emergency room he developed chest pain followed by ventricular tachycardia requiring cardioversion. He was taken emergently to the cath lab and treated with a BMS to the mid LAD. 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 Also with h/o atrial flutter; not anticoagulated due to falls and anemia. Last cardiac catheterization November 2013 showed a 50% ostial left main. The LAD was occluded. There was a 90% lesion in the mid LAD past the insertion of the LIMA which was patent. There was a 50-60% left circumflex. Right coronary artery was occluded. Saphenous vein graft to the obtuse marginal and saphenous vein graft to diagonal occluded. RIMA to the RCA patent. Ejection fraction 50%; patient had PCI of his LAD at that time. Nuclear study January 2016 showed ejection fraction 58%. There was a lateral infarct but no ischemia. TEE 8/17 showed normal LV function, mild AI and MR; no vegetation. Last echo 10/17 showed EF 45-50, grade 1 DD, trace AI, mild MR and mild LAE. 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 Had biliary stent. Developed right pleural effusion.  Since last seen, he denies dyspnea, exertional chest pain or syncope. He does have problems with indigestion.  Current Outpatient Prescriptions  Medication Sig Dispense Refill  . acetaminophen (TYLENOL) 500 MG tablet Take 1,000 mg by mouth every 6 (six) hours as needed for fever.    [name] albuterol (PROVENTIL HFA;VENTOLIN HFA) 108 (90 Base) MCG/ACT inhaler Inhale 2 puffs into the lungs every 6 (six) hours as needed for wheezing or shortness of breath. 1 Inhaler 0  . Amino Acids-Protein Hydrolys (FEEDING SUPPLEMENT, PRO-STAT SUGAR FREE 64,) LIQD Take 30 mLs by mouth 2 (two) times daily. 900 mL 0  . aspirin EC  81 MG EC tablet Take 1 tablet (81 mg total) by mouth daily.    . budesonide-formoterol (SYMBICORT) 80-4.5 MCG/ACT inhaler Inhale 2 puffs into the lungs 2 (two) times daily.    . Ca Phosphate-Cholecalciferol (CALTRATE GUMMY BITES) 250-400 MG-UNIT CHEW Chew 2 tablets by mouth 2 (two) times daily.    . carbamazepine (TEGRETOL) 100 MG chewable tablet Chew 300 mg by mouth 2 (two) times daily.    [name] docusate sodium (COLACE) 100 MG capsule Take 100 mg by mouth daily.     . feeding supplement, ENSURE ENLIVE, (ENSURE ENLIVE) LIQD Take 237 mLs by mouth 2 (two) times daily between meals. 237 mL 12  . finasteride (PROSCAR) 5 MG tablet Take 5 mg by mouth every morning.     [name] ipratropium (ATROVENT HFA) 17 MCG/ACT inhaler Inhale 2 puffs into the lungs 2 (two) times daily as needed for wheezing.     [name] ipratropium (ATROVENT) 0.02 % nebulizer solution Take 2.5 mLs (0.5 mg total) by nebulization 2 (two) times daily as needed for wheezing or shortness of breath. 75 mL 12  . ipratropium-albuterol (DUONEB) 0.5-2.5 (3) MG/3ML SOLN Take 3 mLs by nebulization every 6 (six) hours as needed. (Patient taking differently: Take 3 mLs by nebulization every 6 (six) hours as needed (for SOB). ) 360 mL 2  . isosorbide mononitrate (IMDUR) 30 MG 24 hr tablet Take 1 tablet (30 mg total) by mouth daily. 30 tablet 0  . Melatonin 3 MG TABS Take 3 mg  by mouth at bedtime.    . metoprolol succinate (TOPROL-XL) 12.5 mg TB24 24 hr tablet Take 12.5 mg by mouth daily.    . nitroGLYCERIN (NITROSTAT) 0.4 MG SL tablet Place 1 tablet (0.4 mg total) under the tongue every 5 (five) minutes as needed for chest pain (As needed for chest pain). 25 tablet 3  . OXYGEN Inhale 3 L/min into the lungs continuous.    . potassium chloride SA (K-DUR,KLOR-CON) 20 MEQ tablet Take 20 mEq by mouth daily.     . ranitidine (ZANTAC) 150 MG tablet Take 1 tablet (150 mg total) by mouth 2 (two) times daily. 180 tablet 3  . senna (SENOKOT) 8.6 MG tablet Take 1 tablet by  mouth daily.     . sertraline (ZOLOFT) 100 MG tablet Take 150 mg by mouth every morning.     . tamsulosin (FLOMAX) 0.4 MG CAPS capsule Take 1 capsule (0.4 mg total) by mouth daily. 30 capsule 0  . tiZANidine (ZANAFLEX) 2 MG tablet Take 1 tablet (2 mg total) by mouth every 8 (eight) hours as needed for muscle spasms. 12 tablet 0  . traZODone (DESYREL) 50 MG tablet Take 1 tablet (50 mg total) by mouth at bedtime. 30 tablet 1   No current facility-administered medications for this visit.      Past Medical History:  Diagnosis Date  . Anemia 2014   was on iron pills and then was able to come off  . Arthritis    "all over"  . Asthma       . Atrial flutter ([place])    a. diagnosed 03/2015  . Bacteremia 03/2016  . Complication of anesthesia    B/P dropped low and had to stay in recovery longer  . Coronary artery disease    a. s/p CABG 1986 b. redo CABG 1993 c. PTCA 2008 d. DES 2010  . Depression       . Diverticulosis   . GERD (gastroesophageal reflux disease)       . Hepatitis    hepatitis- long time ago, occupational contamination   . Hiatal hernia   . Hyperlipemia   . Hypertension       . Insomnia       . Kidney stones   . Myocardial infarction   . On home oxygen therapy    "2L; just at night" (01/31/2015)  . Peripheral neuropathy ([place])   . Peripheral vascular disease ([facility])    thrombus- in leg- many yrs. ago- ?R- coumadin x1 mth  . Pneumonia 1960's  . Presence of permanent cardiac pacemaker   . Prostate cancer [facility])    radiation therapy- 2002  . Systolic CHF ([place])       . VT (ventricular tachycardia) ([place]) 06/2012   a. in the setting of STEMI b. on amio during hospitalization    Past Surgical History:  Procedure Laterality Date  . APPENDECTOMY  1943  . BILIARY DRAINAGE  05/14/2016  . CATARACT EXTRACTION W/ INTRAOCULAR LENS  IMPLANT, BILATERAL    . COLONOSCOPY    . CORONARY ARTERY BYPASS GRAFT  1986;  1993   CABG X 5; CABG X 1  . CYSTOSCOPY W/ URETERAL STENT  PLACEMENT Right 02/28/2013   Procedure: CYSTOSCOPY WITH RETROGRADE PYELOGRAM/URETERAL STENT PLACEMENT;  Surgeon: Dutch Gray, MD;  Location: [facility];  Service: Urology;  Laterality: Right;  . CYSTOSCOPY WITH URETEROSCOPY AND STENT PLACEMENT Right 03/23/2013   Procedure: RIGHT URETEROSCOPY, LASER LITHO AND STENT PLACEMENT;  Surgeon: Fredricka Bonine, MD;  Location: [facility];  Service: Urology;  Laterality: Right;  . ENDOSCOPIC RETROGRADE CHOLANGIOPANCREATOGRAPHY (ERCP) WITH PROPOFOL  04/30/2016  . EP IMPLANTABLE DEVICE N/A 04/11/2015   Procedure: Pacemaker Implant;  Surgeon: Will Meredith Leeds, MD;  Location: [place] CV LAB;  Service: Cardiovascular;  Laterality: N/A;  . ERCP N/A 04/30/2016   Procedure: ENDOSCOPIC RETROGRADE CHOLANGIOPANCREATOGRAPHY (ERCP);  Surgeon: Milus Banister, MD;  Location: [place];  Service: Endoscopy;  Laterality: N/A;  . FOOT FUSION Left 2000   heel  . FRACTURE SURGERY     heel- crushed -2002,  (hardware)   . HAMMER TOE SURGERY Right    little toe  . HARDWARE REMOVAL Right 2002   great toe; Dr. Sharol Given  . HOLMIUM LASER APPLICATION Right [id number]   Procedure: HOLMIUM LASER APPLICATION;  Surgeon: Fredricka Bonine, MD;  Location: [facility];  Service: Urology;  Laterality: Right;  . INGUINAL HERNIA REPAIR Left 01/31/2015   Procedure: OPEN REPAIR RECURRENT LEFT INGUINAL HERNIA;  Surgeon: Fanny Skates, MD;  Location: [place];  Service: General;  Laterality: Left;  . INSERTION OF MESH Left 01/31/2015   Procedure: INSERTION OF MESH;  Surgeon: Fanny Skates, MD;  Location: [place];  Service: General;  Laterality: Left;  . INTRAMEDULLARY (IM) NAIL INTERTROCHANTERIC Right 08/29/2013   Procedure: INTRAMEDULLARY (IM) NAIL INTERTROCHANTRIC;  Surgeon: Marianna Payment, MD;  Location: [place];  Service: Orthopedics;  Laterality: Right;  . IR GENERIC HISTORICAL  05/01/2016   IR INT EXT BILIARY DRAIN WITH CHOLANGIOGRAM 05/01/2016 Corrie Mckusick, DO [facility]  . IR GENERIC  HISTORICAL  06/01/2016   IR BILIARY STENT(S) EXISTING ACCESS INC DILATION CATH EXCHANGE 06/01/2016 Corrie Mckusick, DO [facility]  . JOINT REPLACEMENT     R great toe  . KNEE ARTHROSCOPY Right X 2  . LEFT AND RIGHT HEART CATHETERIZATION WITH CORONARY ANGIOGRAM N/A 07/15/2012   Procedure: LEFT AND RIGHT HEART CATHETERIZATION WITH CORONARY ANGIOGRAM;  Surgeon: Jettie Booze, MD;  Location: [facility] CATH LAB;  Service: Cardiovascular;  Laterality: N/A;  . LUMBAR LAMINECTOMY/DECOMPRESSION MICRODISCECTOMY  08/10/2011   Procedure: LUMBAR LAMINECTOMY/DECOMPRESSION MICRODISCECTOMY;  Surgeon: Cooper Render Pool;  Location: [place] NEURO ORS;  Service: Neurosurgery;  Laterality: Right;  RIGHT Lumbar five-sacral one LAMINECTOMY, MICRODISCECTOMY  . LUMBAR WOUND DEBRIDEMENT  08/22/2011   Procedure: LUMBAR WOUND DEBRIDEMENT;  Surgeon: Eustace Moore;  Location: [place] NEURO ORS;  Service: Neurosurgery;  Laterality: N/A;  Repair of CSF Leak requiring laminectomy  . PERCUTANEOUS CORONARY STENT INTERVENTION (PCI-S) N/A 07/15/2012   Procedure: PERCUTANEOUS CORONARY STENT INTERVENTION (PCI-S);  Surgeon: Jettie Booze, MD;  Location: [facility] CATH LAB;  Service: Cardiovascular;  Laterality: N/A;  . RHINOPLASTY  [id number]  . TEE WITHOUT CARDIOVERSION N/A 04/28/2016   Procedure: TRANSESOPHAGEAL ECHOCARDIOGRAM (TEE);  Surgeon: Skeet Latch, MD;  Location: [facility] ENDOSCOPY;  Service: Cardiovascular;  Laterality: N/A;  . TONSILLECTOMY  1940  . TRANSURETHRAL RESECTION OF PROSTATE  x2    Social History   Social History  . Marital status: Married    Spouse name: N/A  . Number of children: N/A  . Years of education: N/A   Occupational History  . Not on file.   Social History Main Topics  . Smoking status: Never Smoker  . Smokeless tobacco: Never Used  . Alcohol use No  . Drug use: No  . Sexual activity: Not on file   Other Topics Concern  . Not on file   Social History Narrative  . No narrative on file    Family  History  Problem Relation Age of Onset  . Heart attack Mother   . Addison's disease Father   . Heart attack Sister   . Anesthesia problems Neg Hx   . Hypotension Neg Hx   . Malignant hyperthermia Neg Hx   . Pseudochol deficiency Neg Hx     ROS: some residual weakness from multiple recent hospitalizations but no fevers or chills, productive cough, hemoptysis, dysphasia, odynophagia, melena, hematochezia, dysuria, hematuria, rash, seizure activity, orthopnea, PND, pedal edema, claudication. Remaining systems are negative.  Physical Exam: Well-developed well-nourished in no acute distress.  Skin is warm and dry.  HEENT is normal.  Neck is supple.  Chest is clear to auscultation with normal expansion.  Cardiovascular exam is regular rate and rhythm.  Abdominal exam nontender or distended. No masses palpated. Extremities show no edema. neuro grossly intact  A/P  1 coronary artery disease-continue aspirin. His statin is on hold given ongoing liver issues. We will resume in the future.  2 hypertension-blood pressure controlled. Continue present medications.  3 hyperlipidemia-continue diet. We will consider resuming statin in the future after his bile duct mass is fully assessed.  4 coronary artery disease-continue aspirin.  5 history of atrial flutter-not on anticoagulation given history of falls and syncope. Continue beta blocker.  6 syncope-no recurrent episodes.  7 bile duct mass-management per gastroenterology.  8. Pacemaker-management per EP  [name], MD

## 2016-06-22 NOTE — Assessment & Plan Note (Signed)
Advised patient to stay on 3L O2 for now. Referral to pulmonology placed recently based on recent hospital consult notes by pulm.

## 2016-06-22 NOTE — Assessment & Plan Note (Signed)
Will increase zantac to BID for better symptomatic relief from heartburn symptoms

## 2016-06-22 NOTE — Progress Notes (Signed)
    Subjective:  [name] is a 82 y.o. male who presents to the [facility] today for a hospital follow up.   HPI: PNA/respiratory failure Doing relatively well after hospital discharge. Finished antibiotics and has been doing well with home health and home PT/OT. Asking if needs to stay on 3L O2 because home health was wondering if he should be weaned down. Had recent episode where fell asleep and knocked out nasal cannula causing him to feel SOB when woke up. Other denies SOB, CP.  GERD Has recently noticed some midepigastric burning sensation, only in the mornings, relieved by 2 tums. Takes zantac regularly. Denies CP associated with diaphoresis or exertion, n/v.  ROS: Per HPI  Objective:  Physical Exam: BP (!) 130/58   Pulse 63   Temp 97.7 F (36.5 C) (Oral)   Ht 5\' 11"  (1.803 m)   Wt 68.5 kg (151 lb)   SpO2 98% Comment: on 3 liters  BMI 21.06 kg/m   Gen: NAD, resting comfortably in wheelchair, frail appearing CV: RRR with no murmurs appreciated Pulm: NWOB on 3L O2 with nasal cannula, CTAB with no crackles, wheezes, or rhonchi GI: Normal bowel sounds present. Soft, Nontender, Nondistended. MSK: no edema, cyanosis, or clubbing noted Skin: warm, dry   Assessment/Plan:  GERD (gastroesophageal reflux disease) Will increase zantac to BID for better symptomatic relief from heartburn symptoms  Acute on chronic respiratory failure with hypoxemia ([place]) Advised patient to stay on 3L O2 for now. Referral to pulmonology placed recently based on recent hospital consult notes by pulm.   Bufford Lope, DO PGY-1, [facility] 06/22/2016 4:18 AM

## 2016-06-25 ENCOUNTER — Encounter: Payer: Self-pay | Admitting: Cardiology

## 2016-06-25 ENCOUNTER — Ambulatory Visit (INDEPENDENT_AMBULATORY_CARE_PROVIDER_SITE_OTHER): Payer: Medicare Other | Admitting: Cardiology

## 2016-06-25 VITALS — BP 124/56 | HR 72 | Ht 71.0 in | Wt 152.0 lb

## 2016-06-25 DIAGNOSIS — I10 Essential (primary) hypertension: Secondary | ICD-10-CM | POA: Diagnosis not present

## 2016-06-25 DIAGNOSIS — I2510 Atherosclerotic heart disease of native coronary artery without angina pectoris: Secondary | ICD-10-CM | POA: Diagnosis not present

## 2016-06-25 DIAGNOSIS — I4892 Unspecified atrial flutter: Secondary | ICD-10-CM

## 2016-06-25 DIAGNOSIS — E7800 Pure hypercholesterolemia, unspecified: Secondary | ICD-10-CM | POA: Diagnosis not present

## 2016-06-25 NOTE — Patient Instructions (Signed)
Your physician recommends that you schedule a follow-up appointment in: 3 MONTHS WITH DR CRENSHAW  

## 2016-06-28 ENCOUNTER — Telehealth: Payer: Self-pay | Admitting: *Deleted

## 2016-06-28 NOTE — Telephone Encounter (Signed)
[name], Physical Therapist with [facility] called to request verbal order to continue physical therapy. Physical therapy certification expires on 07/01/16.  Would like to continue physical therapy once a week for 5 weeks.  Verbal order given to continue physical therapy as requested.  Derl Barrow, RN

## 2016-06-29 ENCOUNTER — Encounter: Payer: Self-pay | Admitting: Family Medicine

## 2016-06-29 ENCOUNTER — Telehealth: Payer: Self-pay | Admitting: Family Medicine

## 2016-06-29 NOTE — Telephone Encounter (Signed)
OT with [facility]:  Requesting verbal orders for OT for 2 times for 2 weeks and 1 time for one week

## 2016-06-30 NOTE — Telephone Encounter (Signed)
Stevan Born Nurse w/ [facility] called wanting to extend patient nursing orders, 1x a week for 3 weeks. Please call her at [phone] with those verbal orders.

## 2016-07-01 ENCOUNTER — Other Ambulatory Visit: Payer: Self-pay | Admitting: Family Medicine

## 2016-07-01 DIAGNOSIS — J9621 Acute and chronic respiratory failure with hypoxia: Secondary | ICD-10-CM

## 2016-07-06 ENCOUNTER — Other Ambulatory Visit: Payer: Self-pay | Admitting: Family Medicine

## 2016-07-12 ENCOUNTER — Ambulatory Visit (INDEPENDENT_AMBULATORY_CARE_PROVIDER_SITE_OTHER): Payer: Medicare Other | Admitting: Pulmonary Disease

## 2016-07-12 ENCOUNTER — Encounter: Payer: Self-pay | Admitting: Pulmonary Disease

## 2016-07-12 ENCOUNTER — Other Ambulatory Visit (INDEPENDENT_AMBULATORY_CARE_PROVIDER_SITE_OTHER): Payer: Medicare Other

## 2016-07-12 ENCOUNTER — Ambulatory Visit (INDEPENDENT_AMBULATORY_CARE_PROVIDER_SITE_OTHER)
Admission: RE | Admit: 2016-07-12 | Discharge: 2016-07-12 | Disposition: A | Discharge status: Home / Self Care | Payer: Medicare Other | Source: Ambulatory Visit | Attending: Pulmonary Disease | Admitting: Pulmonary Disease

## 2016-07-12 VITALS — BP 110/62 | HR 67 | Ht 70.0 in | Wt 160.0 lb

## 2016-07-12 DIAGNOSIS — K831 Obstruction of bile duct: Secondary | ICD-10-CM

## 2016-07-12 DIAGNOSIS — J449 Chronic obstructive pulmonary disease, unspecified: Secondary | ICD-10-CM | POA: Diagnosis not present

## 2016-07-12 DIAGNOSIS — J189 Pneumonia, unspecified organism: Secondary | ICD-10-CM | POA: Diagnosis not present

## 2016-07-12 LAB — HEPATIC FUNCTION PANEL
ALBUMIN: 3.5 g/dL (ref 3.5–5.2)
ALK PHOS: 154 U/L — AB (ref 39–117)
ALT: 16 U/L (ref 0–53)
AST: 30 U/L (ref 0–37)
BILIRUBIN DIRECT: 0.1 mg/dL (ref 0.0–0.3)
Total Bilirubin: 0.3 mg/dL (ref 0.2–1.2)
Total Protein: 7.7 g/dL (ref 6.0–8.3)

## 2016-07-12 NOTE — Patient Instructions (Addendum)
We'll get a chest x-ray today to make sure that the pneumonia is resolving. It's okay to stop using the oxygen as his sats have remained greater than 90% at rest and on exertion. We will check an overnight oximetry off oxygen.   Return to clinic in 2 months.

## 2016-07-12 NOTE — Progress Notes (Signed)
[name]    [id number]    17-Jun-1925  Primary Care Physician:Elsia Nigel Sloop, DO  Referring Physician: Bufford Lope, DO [address] [place], [place] [address]  Chief complaint:   Follow up after recent admission with PNA and pleural effusion  HPI: [name] is a 82 year old with past medical history as below.   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 He started having spiking fevers since his surgical procedure. Gastric oncology services did not think the gallbladder was a source of the fever. Chest x-ray showed worsening right basilar opacification and infiltrates with a small effusion. Despite treatment with IV antibiotics and hydration he continued to worsen. CT chest 06/03/2016 showed large right-sided pleural effusion. He underwent thoracentesis 06/04/2016 and had 1.11 L of amber color exudate removed. He clinically was After the thoracentesis. This was thought to be secondary to mild lung injury from pneumonia versus reexpansion pulmonary edema. He has undergone with IV Lasix and continuation of antibiotics.  Since his discharge he continues to do well. He has slow improvement in his capacity. He was sent home on oxygen 2-3 L. He continues to improve and has rehabilitation at home. He does not desat on ablation and immobile. Note from the rehabilitation reviewed. It shows O2 sats remained 93-95% even on ambulation.    but he continued to worsen now requiring 6 L of oxygen. Therefore pulmonary has been consulted. Currently is nontoxic but requiring high oxygen without high work of breathing. His MEWS score at this point is 1. Chest x-ray shows clearance of the right-sided effusion but persistent right lower lobe and left lower lobe pulmonary infiltrate with a right lower lobe being greater. He has been afebrile since admission with a normal white  count.   Outpatient Encounter Prescriptions as of 07/12/2016  Medication Sig  . acetaminophen (TYLENOL) 500 MG tablet Take 1,000 mg by mouth every 6 (six) hours as needed for fever.  [name] albuterol (PROVENTIL HFA;VENTOLIN HFA) 108 (90 Base) MCG/ACT inhaler Inhale 2 puffs into the lungs every 6 (six) hours as needed for wheezing or shortness of breath.  . Amino Acids-Protein Hydrolys (FEEDING SUPPLEMENT, PRO-STAT SUGAR FREE 64,) LIQD Take 30 mLs by mouth 2 (two) times daily.  [name] aspirin EC 81 MG EC tablet Take 1 tablet (81 mg total) by mouth daily.  . budesonide-formoterol (SYMBICORT) 80-4.5 MCG/ACT inhaler Inhale 2 puffs into the lungs 2 (two) times daily.  . Ca Phosphate-Cholecalciferol (CALTRATE GUMMY BITES) 250-400 MG-UNIT CHEW Chew 2 tablets by mouth 2 (two) times daily.  . carbamazepine (TEGRETOL) 100 MG chewable tablet Chew 300 mg by mouth 2 (two) times daily.  [name] docusate sodium (COLACE) 100 MG capsule Take 100 mg by mouth daily.   . feeding supplement, ENSURE ENLIVE, (ENSURE ENLIVE) LIQD Take 237 mLs by mouth 2 (two) times daily between meals.  . finasteride (PROSCAR) 5 MG tablet Take 5 mg by mouth every morning.   [name] ipratropium (ATROVENT HFA) 17 MCG/ACT inhaler Inhale 2 puffs into the lungs 2 (two) times daily as needed for wheezing.   [name] ipratropium (ATROVENT) 0.02 % nebulizer solution Take 2.5 mLs (0.5 mg total) by nebulization 2 (two) times daily as needed for wheezing or shortness of breath.  [name] ipratropium-albuterol (DUONEB) 0.5-2.5 (3) MG/3ML SOLN Take 3 mLs by nebulization every 6 (six) hours as needed. (  Patient taking differently: Take 3 mLs by nebulization every 6 (six) hours as needed (for SOB). )  . isosorbide mononitrate (IMDUR) 30 MG 24 hr tablet Take 1 tablet (30 mg total) by mouth daily.  . Melatonin 3 MG TABS Take 3 mg by mouth at bedtime.  . metoprolol succinate (TOPROL-XL) 12.5 mg TB24 24 hr tablet Take 12.5 mg by mouth daily.  . nitroGLYCERIN (NITROSTAT) 0.4 MG SL tablet  Place 1 tablet (0.4 mg total) under the tongue every 5 (five) minutes as needed for chest pain (As needed for chest pain).  . OXYGEN Inhale 3 L/min into the lungs continuous.  . potassium chloride SA (K-DUR,KLOR-CON) 20 MEQ tablet Take 20 mEq by mouth daily.   . ranitidine (ZANTAC) 150 MG tablet Take 1 tablet (150 mg total) by mouth 2 (two) times daily.  [name] senna (SENOKOT) 8.6 MG tablet Take 1 tablet by mouth daily.   . sertraline (ZOLOFT) 100 MG tablet Take 150 mg by mouth every morning.   . tamsulosin (FLOMAX) 0.4 MG CAPS capsule Take 1 capsule (0.4 mg total) by mouth daily.  . traZODone (DESYREL) 50 MG tablet TAKE ONE TABLET BY MOUTH ONCE DAILY AT BEDTIME  . [DISCONTINUED] tiZANidine (ZANAFLEX) 2 MG tablet Take 1 tablet (2 mg total) by mouth every 8 (eight) hours as needed for muscle spasms. (Patient not taking: Reported on 07/12/2016)   No facility-administered encounter medications on file as of 07/12/2016.     Allergies as of 07/12/2016  . (No Known Allergies)    Past Medical History:  Diagnosis Date  . Anemia 2014   was on iron pills and then was able to come off  . Arthritis    "all over"  . Asthma       . Atrial flutter ([place])    a. diagnosed 03/2015  . Bacteremia 03/2016  . Complication of anesthesia    B/P dropped low and had to stay in recovery longer  . Coronary artery disease    a. s/p CABG 1986 b. redo CABG 1993 c. PTCA 2008 d. DES 2010  . Depression       . Diverticulosis   . GERD (gastroesophageal reflux disease)       . Hepatitis    hepatitis- long time ago, occupational contamination   . Hiatal hernia   . Hyperlipemia   . Hypertension       . Insomnia       . Kidney stones   . Myocardial infarction   . On home oxygen therapy    "2L; just at night" (01/31/2015)  . Peripheral neuropathy ([place])   . Peripheral vascular disease ([facility])    thrombus- in leg- many yrs. ago- ?R- coumadin x1 mth  . Pneumonia 1960's  . Presence of permanent cardiac pacemaker   .  Prostate cancer [facility])    radiation therapy- 2002  . Systolic CHF ([place])       . VT (ventricular tachycardia) ([place]) 06/2012   a. in the setting of STEMI b. on amio during hospitalization    Past Surgical History:  Procedure Laterality Date  . APPENDECTOMY  1943  . BILIARY DRAINAGE  05/14/2016  . CATARACT EXTRACTION W/ INTRAOCULAR LENS  IMPLANT, BILATERAL    . COLONOSCOPY    . CORONARY ARTERY BYPASS GRAFT  1986;  1993   CABG X 5; CABG X 1  . CYSTOSCOPY W/ URETERAL STENT PLACEMENT Right 02/28/2013   Procedure: CYSTOSCOPY WITH RETROGRADE PYELOGRAM/URETERAL STENT PLACEMENT;  Surgeon: Dutch Gray, MD;  Location: [facility];  Service: Urology;  Laterality: Right;  . CYSTOSCOPY WITH URETEROSCOPY AND STENT PLACEMENT Right 03/23/2013   Procedure: RIGHT URETEROSCOPY, LASER LITHO AND STENT PLACEMENT;  Surgeon: Fredricka Bonine, MD;  Location: [facility];  Service: Urology;  Laterality: Right;  . ENDOSCOPIC RETROGRADE CHOLANGIOPANCREATOGRAPHY (ERCP) WITH PROPOFOL  04/30/2016  . EP IMPLANTABLE DEVICE N/A 04/11/2015   Procedure: Pacemaker Implant;  Surgeon: Will Meredith Leeds, MD;  Location: [facility];  Service: Cardiovascular;  Laterality: N/A;  . ERCP N/A 04/30/2016   Procedure: ENDOSCOPIC RETROGRADE CHOLANGIOPANCREATOGRAPHY (ERCP);  Surgeon: Milus Banister, MD;  Location: [facility];  Service: Endoscopy;  Laterality: N/A;  . FOOT FUSION Left 2000   heel  . FRACTURE SURGERY     heel- crushed -2002,  (hardware)   . HAMMER TOE SURGERY Right    little toe  . HARDWARE REMOVAL Right 2002   great toe; Dr. Sharol Given  . HOLMIUM LASER APPLICATION Right [id number]   Procedure: HOLMIUM LASER APPLICATION;  Surgeon: Fredricka Bonine, MD;  Location: [facility];  Service: Urology;  Laterality: Right;  . INGUINAL HERNIA REPAIR Left 01/31/2015   Procedure: OPEN REPAIR RECURRENT LEFT INGUINAL HERNIA;  Surgeon: Fanny Skates, MD;  Location: [facility];  Service: General;  Laterality: Left;  . INSERTION OF MESH Left  01/31/2015   Procedure: INSERTION OF MESH;  Surgeon: Fanny Skates, MD;  Location: [place];  Service: General;  Laterality: Left;  . INTRAMEDULLARY (IM) NAIL INTERTROCHANTERIC Right 08/29/2013   Procedure: INTRAMEDULLARY (IM) NAIL INTERTROCHANTRIC;  Surgeon: Marianna Payment, MD;  Location: [place];  Service: Orthopedics;  Laterality: Right;  . IR GENERIC HISTORICAL  05/01/2016   IR INT EXT BILIARY DRAIN WITH CHOLANGIOGRAM 05/01/2016 Corrie Mckusick, DO [facility]  . IR GENERIC HISTORICAL  06/01/2016   IR BILIARY STENT(S) EXISTING ACCESS INC DILATION CATH EXCHANGE 06/01/2016 Corrie Mckusick, DO [facility]  . JOINT REPLACEMENT     R great toe  . KNEE ARTHROSCOPY Right X 2  . LEFT AND RIGHT HEART CATHETERIZATION WITH CORONARY ANGIOGRAM N/A 07/15/2012   Procedure: LEFT AND RIGHT HEART CATHETERIZATION WITH CORONARY ANGIOGRAM;  Surgeon: Jettie Booze, MD;  Location: [facility] CATH LAB;  Service: Cardiovascular;  Laterality: N/A;  . LUMBAR LAMINECTOMY/DECOMPRESSION MICRODISCECTOMY  08/10/2011   Procedure: LUMBAR LAMINECTOMY/DECOMPRESSION MICRODISCECTOMY;  Surgeon: Cooper Render Pool;  Location: [place] [facility]UR[facility];  Service: Neurosurgery;  Laterality: Right;  RIGHT Lumbar five-sacral one LAMINECTOMY, MICRODISCECTOMY  . LUMBAR WOUND DEBRIDEMENT  08/22/2011   Procedure: LUMBAR WOUND DEBRIDEMENT;  Surgeon: Eustace Moore;  Location: [place] [facility];  Service: Neurosurgery;  Laterality: N/A;  Repair of CSF Leak requiring laminectomy  . PERCUTANEOUS CORONARY STENT INTERVENTION (PCI-S) N/A 07/15/2012   Procedure: PERCUTANEOUS CORONARY STENT INTERVENTION (PCI-S);  Surgeon: Jettie Booze, MD;  Location: [facility] CATH LAB;  Service: Cardiovascular;  Laterality: N/A;  . RHINOPLASTY  [id number]  . TEE WITHOUT CARDIOVERSION N/A 04/28/2016   Procedure: TRANSESOPHAGEAL ECHOCARDIOGRAM (TEE);  Surgeon: Skeet Latch, MD;  Location: [facility] ENDOSCOPY;  Service: Cardiovascular;  Laterality: N/A;  . TONSILLECTOMY  1940  . TRANSURETHRAL RESECTION OF  PROSTATE  x2    Family History  Problem Relation Age of Onset  . Heart attack Mother   . Addison's disease Father   . Heart attack Sister   . Heart disease Brother   . Prostate cancer Brother   . Heart disease Sister   . Heart disease Brother   . Lung cancer Brother   . Anesthesia problems Neg  Hx   . Hypotension Neg Hx   . Malignant hyperthermia Neg Hx   . Pseudochol deficiency Neg Hx     Social History   Social History  . Marital status: Married    Spouse name: N/A  . Number of children: N/A  . Years of education: N/A   Occupational History  . Not on file.   Social History Main Topics  . Smoking status: Never Smoker  . Smokeless tobacco: Never Used  . Alcohol use No  . Drug use: No  . Sexual activity: Not on file   Other Topics Concern  . Not on file   Social History Narrative   Married, lives with spouse   2 children - daughter has MS, recent pulmonary and cardiac arrest   OCCUPATION: retired from the lab at [employer], ML[employer]     Review of systems: Review of Systems  Constitutional: Negative for fever and chills.  HENT: Negative.   Eyes: Negative for blurred vision.  Respiratory: as per HPI  Cardiovascular: Negative for chest pain and palpitations.  Gastrointestinal: Negative for vomiting, diarrhea, blood per rectum. Genitourinary: Negative for dysuria, urgency, frequency and hematuria.  Musculoskeletal: Negative for myalgias, back pain and joint pain.  Skin: Negative for itching and rash.  Neurological: Negative for dizziness, tremors, focal weakness, seizures and loss of consciousness.  Endo/Heme/Allergies: Negative for environmental allergies.  Psychiatric/Behavioral: Negative for depression, suicidal ideas and hallucinations.  All other systems reviewed and are negative.   Physical Exam: Blood pressure 110/62, pulse 67, height 5\' 10"  (1.778 m), weight 160 lb (72.6 kg), SpO2 97 %. Gen:      No acute distress HEENT:  EOMI, sclera  anicteric Neck:     No masses; no thyromegaly Lungs:    Clear to auscultation bilaterally; normal respiratory effort CV:         Regular rate and rhythm; no murmurs Abd:      + bowel sounds; soft, non-tender; no palpable masses, no distension Ext:    No edema; adequate peripheral perfusion Skin:      Warm and dry; no rash Neuro: alert and oriented x 3 Psych: normal mood and affect  Data Reviewed: PFTs 09/12/12 FVC 2.18 [80%] FEV1 2.20 [90%) F/F 69 TLC 90% DLCO 94% Mild obstructive lung defect positive broncho-dilator response.   CTA 06/03/16-no PE, large right located effusion. CXR 06/07/16- persistence ofd RML opacity, effusion.  All images reviewed.   Pleural studies 06/04/16 No malignant cells identified. All culture data negative. 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 Located right pleural effusion status post thoracentesis  [name] continues to improve slowly. He does not desaturate in the office today or at home with rehab even on ambulation. I have asked him to stop the supplemental oxygen. He'll continue to monitor his O2 sats. I'll also get an overnight oximetry to make sure there are no desaturations at night. I'll get a chest x-ray today to make sure that the right lung opacity continues to improve.  He has a diagnosis of COPD from the past but only minimal obstruction on PFTs in 2014. He does not have a smoking history. He'll require repeat PFTs which will be obtained at next visit after he has recovered fully from recent pneumonia.  Plan/Recommendations: - Chest Xray - OK to stop O2 - Check overnight oximetry.  Marshell Garfinkel MD  [facility] Pager [phone] 07/12/2016, 3:54 PM  CC: Bufford Lope, DO

## 2016-07-16 ENCOUNTER — Encounter: Payer: Self-pay | Admitting: Family Medicine

## 2016-07-16 NOTE — Progress Notes (Signed)
Called spoke with patient, advised of cxr results / recs as stated by PM.  Pt verbalized his understanding and denied any questions.

## 2016-07-19 ENCOUNTER — Ambulatory Visit (INDEPENDENT_AMBULATORY_CARE_PROVIDER_SITE_OTHER): Payer: Medicare Other | Admitting: Gastroenterology

## 2016-07-19 ENCOUNTER — Encounter: Payer: Self-pay | Admitting: Gastroenterology

## 2016-07-19 ENCOUNTER — Other Ambulatory Visit (INDEPENDENT_AMBULATORY_CARE_PROVIDER_SITE_OTHER): Payer: Medicare Other

## 2016-07-19 VITALS — BP 116/60 | HR 68 | Ht 70.0 in | Wt 161.0 lb

## 2016-07-19 DIAGNOSIS — K831 Obstruction of bile duct: Secondary | ICD-10-CM

## 2016-07-19 DIAGNOSIS — D490 Neoplasm of unspecified behavior of digestive system: Secondary | ICD-10-CM

## 2016-07-19 LAB — CBC WITH DIFFERENTIAL/PLATELET
BASOS ABS: 0 10*3/uL (ref 0.0–0.1)
Basophils Relative: 0.4 % (ref 0.0–3.0)
EOS PCT: 1.4 % (ref 0.0–5.0)
Eosinophils Absolute: 0.1 10*3/uL (ref 0.0–0.7)
HEMATOCRIT: 31.7 % — AB (ref 39.0–52.0)
HEMOGLOBIN: 10.5 g/dL — AB (ref 13.0–17.0)
LYMPHS ABS: 2.5 10*3/uL (ref 0.7–4.0)
LYMPHS PCT: 35.9 % (ref 12.0–46.0)
MCHC: 33.1 g/dL (ref 30.0–36.0)
MCV: 86.2 fl (ref 78.0–100.0)
MONOS PCT: 8.1 % (ref 3.0–12.0)
Monocytes Absolute: 0.6 10*3/uL (ref 0.1–1.0)
NEUTROS PCT: 54.2 % (ref 43.0–77.0)
Neutro Abs: 3.8 10*3/uL (ref 1.4–7.7)
Platelets: 294 10*3/uL (ref 150.0–400.0)
RBC: 3.68 Mil/uL — AB (ref 4.22–5.81)
RDW: 19.4 % — ABNORMAL HIGH (ref 11.5–15.5)
WBC: 6.9 10*3/uL (ref 4.0–10.5)

## 2016-07-19 LAB — COMPREHENSIVE METABOLIC PANEL
ALBUMIN: 3.5 g/dL (ref 3.5–5.2)
ALK PHOS: 118 U/L — AB (ref 39–117)
ALT: 16 U/L (ref 0–53)
AST: 29 U/L (ref 0–37)
BILIRUBIN TOTAL: 0.3 mg/dL (ref 0.2–1.2)
BUN: 26 mg/dL — ABNORMAL HIGH (ref 6–23)
CO2: 29 mEq/L (ref 19–32)
Calcium: 9.9 mg/dL (ref 8.4–10.5)
Chloride: 104 mEq/L (ref 96–112)
Creatinine, Ser: 0.81 mg/dL (ref 0.40–1.50)
GFR: 95.28 mL/min (ref >60.00)
GLUCOSE: 97 mg/dL (ref 70–99)
Potassium: 4.4 mEq/L (ref 3.5–5.1)
Sodium: 138 mEq/L (ref 135–145)
TOTAL PROTEIN: 7.5 g/dL (ref 6.0–8.3)

## 2016-07-19 NOTE — Progress Notes (Signed)
Review of pertinent gastrointestinal problems: 1. 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 Ardis Hughs 04/2016 but stent could not be placed so he underwent an internal/external biliary drain placement on September 2.  05/2016 the IR drain was replaced with an uncovered metal stent across the distal common bile duct/ampullary tumor with bedside removal of the internal/external drain after confirmation of relief of the obstruction by IR.   HPI: This is a   very pleasant 82 year old man whom I last saw about 2 months ago the time of an inpatient ERCP. See those results above.   Chief complaint is duodenal neoplasia  No need for oxygen in the past few days since he has recovered from hospital-acquired pneumonia.  Weight has all come back, regained 10 pounds.    Drinking 3 cans of enxure daily.  Has home care help, using a walker for years.  He is not having significant abdominal pains. He is keeping his food down, may nutrition source is 3 cans of ensure daily.  ROS: complete GI ROS as described in HPI.  Constitutional:  No unintentional weight loss   Past Medical History:  Diagnosis Date  . Anemia 2014   was on iron pills and then was able to come off  . Arthritis    "all over"  . Asthma       . Atrial flutter ([place])    a. diagnosed 03/2015  . Bacteremia 03/2016  . Complication of anesthesia    B/P dropped low and had to stay in recovery longer  . Coronary artery disease    a. s/p CABG 1986 b. redo CABG 1993 c. PTCA 2008 d. DES 2010  . Depression       . Diverticulosis   . GERD (gastroesophageal reflux disease)       . Hepatitis    hepatitis- long time ago, occupational contamination   . Hiatal hernia   . Hyperlipemia   .  Hypertension       . Insomnia       . Kidney stones   . Myocardial infarction   . On home oxygen therapy    "2L; just at night" (01/31/2015)  . Peripheral neuropathy ([place])   . Peripheral vascular disease (HCC)    thrombus- in leg- many yrs. ago- ?R- coumadin x1 mth  . Pneumonia 1960's  . Presence of permanent cardiac pacemaker   . Prostate cancer [facility])    radiation therapy- 2002  . Systolic CHF ([place])       . VT (ventricular tachycardia) ([place]) 06/2012   a. in the setting of STEMI b. on amio during hospitalization    Past Surgical History:  Procedure Laterality Date  . APPENDECTOMY  1943  . BILIARY DRAINAGE  05/14/2016  . CATARACT EXTRACTION W/ INTRAOCULAR LENS  IMPLANT, BILATERAL    . COLONOSCOPY    . CORONARY ARTERY BYPASS GRAFT  1986;  1993   CABG X 5; CABG X 1  . CYSTOSCOPY W/ URETERAL STENT PLACEMENT Right 02/28/2013   Procedure: CYSTOSCOPY WITH RETROGRADE PYELOGRAM/URETERAL STENT PLACEMENT;  Surgeon: Dutch Gray, MD;  Location: [facility];  Service: Urology;  Laterality: Right;  . CYSTOSCOPY WITH URETEROSCOPY AND STENT PLACEMENT Right 03/23/2013   Procedure: RIGHT URETEROSCOPY, LASER LITHO AND STENT PLACEMENT;  Surgeon: Fredricka Bonine, MD;  Location: [facility];  Service: Urology;  Laterality: Right;  . ENDOSCOPIC RETROGRADE CHOLANGIOPANCREATOGRAPHY (ERCP) WITH PROPOFOL  04/30/2016  . EP IMPLANTABLE DEVICE N/A 04/11/2015   Procedure: Pacemaker Implant;  Surgeon: Will Meredith Leeds, MD;  Location: [place] CV LAB;  Service: Cardiovascular;  Laterality: N/A;  . ERCP N/A 04/30/2016   Procedure: ENDOSCOPIC RETROGRADE CHOLANGIOPANCREATOGRAPHY (ERCP);  Surgeon: Milus Banister, MD;  Location: [place];  Service: Endoscopy;  Laterality: N/A;  . FOOT FUSION Left 2000   heel  . FRACTURE SURGERY     heel- crushed -2002,  (hardware)   . HAMMER TOE SURGERY Right    little toe  . HARDWARE REMOVAL Right 2002   great toe; Dr. Sharol Given  . HOLMIUM LASER APPLICATION Right [id number]    Procedure: HOLMIUM LASER APPLICATION;  Surgeon: Fredricka Bonine, MD;  Location: [facility];  Service: Urology;  Laterality: Right;  . INGUINAL HERNIA REPAIR Left 01/31/2015   Procedure: OPEN REPAIR RECURRENT LEFT INGUINAL HERNIA;  Surgeon: Fanny Skates, MD;  Location: [place];  Service: General;  Laterality: Left;  . INSERTION OF MESH Left 01/31/2015   Procedure: INSERTION OF MESH;  Surgeon: Fanny Skates, MD;  Location: [place];  Service: General;  Laterality: Left;  . INTRAMEDULLARY (IM) NAIL INTERTROCHANTERIC Right 08/29/2013   Procedure: INTRAMEDULLARY (IM) NAIL INTERTROCHANTRIC;  Surgeon: Marianna Payment, MD;  Location: [place];  Service: Orthopedics;  Laterality: Right;  . IR GENERIC HISTORICAL  05/01/2016   IR INT EXT BILIARY DRAIN WITH CHOLANGIOGRAM 05/01/2016 Corrie Mckusick, DO [facility]  . IR GENERIC HISTORICAL  06/01/2016   IR BILIARY STENT(S) EXISTING ACCESS INC DILATION CATH EXCHANGE 06/01/2016 Corrie Mckusick, DO [facility]  . JOINT REPLACEMENT     R great toe  . KNEE ARTHROSCOPY Right X 2  . LEFT AND RIGHT HEART CATHETERIZATION WITH CORONARY ANGIOGRAM N/A 07/15/2012   Procedure: LEFT AND RIGHT HEART CATHETERIZATION WITH CORONARY ANGIOGRAM;  Surgeon: Jettie Booze, MD;  Location: [facility] CATH LAB;  Service: Cardiovascular;  Laterality: N/A;  . LUMBAR LAMINECTOMY/DECOMPRESSION MICRODISCECTOMY  08/10/2011   Procedure: LUMBAR LAMINECTOMY/DECOMPRESSION MICRODISCECTOMY;  Surgeon: Cooper Render Pool;  Location: [place] NEURO ORS;  Service: Neurosurgery;  Laterality: Right;  RIGHT Lumbar five-sacral one LAMINECTOMY, MICRODISCECTOMY  . LUMBAR WOUND DEBRIDEMENT  08/22/2011   Procedure: LUMBAR WOUND DEBRIDEMENT;  Surgeon: Eustace Moore;  Location: [place] NEURO ORS;  Service: Neurosurgery;  Laterality: N/A;  Repair of CSF Leak requiring laminectomy  . PERCUTANEOUS CORONARY STENT INTERVENTION (PCI-S) N/A 07/15/2012   Procedure: PERCUTANEOUS CORONARY STENT INTERVENTION (PCI-S);  Surgeon: Jettie Booze, MD;   Location: [facility] CATH LAB;  Service: Cardiovascular;  Laterality: N/A;  . RHINOPLASTY  [id number]  . TEE WITHOUT CARDIOVERSION N/A 04/28/2016   Procedure: TRANSESOPHAGEAL ECHOCARDIOGRAM (TEE);  Surgeon: Skeet Latch, MD;  Location: [facility] ENDOSCOPY;  Service: Cardiovascular;  Laterality: N/A;  . TONSILLECTOMY  1940  . TRANSURETHRAL RESECTION OF PROSTATE  x2    Current Outpatient Prescriptions  Medication Sig Dispense Refill  . acetaminophen (TYLENOL) 500 MG tablet Take 1,000 mg by mouth every 6 (six) hours as needed for fever.    [name] albuterol (PROVENTIL HFA;VENTOLIN HFA) 108 (90 Base) MCG/ACT inhaler Inhale 2 puffs into the lungs every 6 (six) hours as needed for wheezing or shortness of breath. 1 Inhaler 0  . Amino Acids-Protein Hydrolys (FEEDING SUPPLEMENT, PRO-STAT SUGAR FREE 64,) LIQD Take 30 mLs by mouth 2 (two) times daily. 900 mL 0  . aspirin EC 81 MG EC tablet Take 1 tablet (  81 mg total) by mouth daily.    . budesonide-formoterol (SYMBICORT) 80-4.5 MCG/ACT inhaler Inhale 2 puffs into the lungs 2 (two) times daily.    . Ca Phosphate-Cholecalciferol (CALTRATE GUMMY BITES) 250-400 MG-UNIT CHEW Chew 2 tablets by mouth 2 (two) times daily.    . carbamazepine (TEGRETOL) 100 MG chewable tablet Chew 300 mg by mouth 2 (two) times daily.    [name] docusate sodium (COLACE) 100 MG capsule Take 100 mg by mouth daily.     . feeding supplement, ENSURE ENLIVE, (ENSURE ENLIVE) LIQD Take 237 mLs by mouth 2 (two) times daily between meals. 237 mL 12  . finasteride (PROSCAR) 5 MG tablet Take 5 mg by mouth every morning.     [name] ipratropium (ATROVENT HFA) 17 MCG/ACT inhaler Inhale 2 puffs into the lungs 2 (two) times daily as needed for wheezing.     [name] ipratropium (ATROVENT) 0.02 % nebulizer solution Take 2.5 mLs (0.5 mg total) by nebulization 2 (two) times daily as needed for wheezing or shortness of breath. 75 mL 12  . ipratropium-albuterol (DUONEB) 0.5-2.5 (3) MG/3ML SOLN Take 3 mLs by nebulization every 6 (six)  hours as needed. (Patient taking differently: Take 3 mLs by nebulization every 6 (six) hours as needed (for SOB). ) 360 mL 2  . isosorbide mononitrate (IMDUR) 30 MG 24 hr tablet Take 1 tablet (30 mg total) by mouth daily. 30 tablet 0  . Melatonin 3 MG TABS Take 3 mg by mouth at bedtime.    . metoprolol succinate (TOPROL-XL) 12.5 mg TB24 24 hr tablet Take 12.5 mg by mouth daily.    . nitroGLYCERIN (NITROSTAT) 0.4 MG SL tablet Place 1 tablet (0.4 mg total) under the tongue every 5 (five) minutes as needed for chest pain (As needed for chest pain). 25 tablet 3  . potassium chloride SA (K-DUR,KLOR-CON) 20 MEQ tablet Take 20 mEq by mouth daily.     . ranitidine (ZANTAC) 150 MG tablet Take 1 tablet (150 mg total) by mouth 2 (two) times daily. 180 tablet 3  . senna (SENOKOT) 8.6 MG tablet Take 1 tablet by mouth daily.     . sertraline (ZOLOFT) 100 MG tablet Take 150 mg by mouth every morning.     . tamsulosin (FLOMAX) 0.4 MG CAPS capsule Take 1 capsule (0.4 mg total) by mouth daily. 30 capsule 0  . traZODone (DESYREL) 50 MG tablet TAKE ONE TABLET BY MOUTH ONCE DAILY AT BEDTIME 30 tablet 1   No current facility-administered medications for this visit.     Allergies as of 07/19/2016  . (No Known Allergies)    Family History  Problem Relation Age of Onset  . Heart attack Mother   . Addison's disease Father   . Heart attack Sister   . Heart disease Brother   . Prostate cancer Brother   . Heart disease Sister   . Heart disease Brother   . Lung cancer Brother   . Anesthesia problems Neg Hx   . Hypotension Neg Hx   . Malignant hyperthermia Neg Hx   . Pseudochol deficiency Neg Hx     Social History   Social History  . Marital status: Married    Spouse name: N/A  . Number of children: N/A  . Years of education: N/A   Occupational History  . Not on file.   Social History Main Topics  . Smoking status: Never Smoker  . Smokeless tobacco: Never Used  . Alcohol use No  . Drug use: No  .  Sexual activity: Not on file   Other Topics Concern  . Not on file   Social History Narrative   Married, lives with spouse   2 children - daughter has MS, recent pulmonary and cardiac arrest   OCCUPATION: retired from the lab at [employer], ML[employer]     Physical Exam: BP 116/60 (BP Location: Left Arm, Patient Position: Sitting, Cuff Size: Normal)   Pulse 68   Ht 5\' 10"  (1.778 m)   Wt 161 lb (73 kg)   BMI 23.10 kg/m  Constitutional: Frail, elderly but with very sharp mind. Walks with a walker Psychiatric: alert and oriented x3 Abdomen: soft, nontender, nondistended, no obvious ascites, no peritoneal signs, normal bowel sounds No peripheral edema noted in lower extremities  Assessment and plan: 82 y.o. male with advanced duodenal neoplasia, periampullary  The uncovered, permanent interventional radiology placed bile duct stent is clearly working. Repeat liver tests last week were normal. He is eating ensure and other food well enough to have gained about 10 pounds back since his hospitalization. He is getting around with a walker again. We discussed the fact that he has advanced neoplasia in his duodenum and that in fact there might be underlying cancer. He understands that if his age he has not candidate for any type of aggressive surgery. If we knew for a fact that he had cancer present chemotherapy plus minus radiation might be an option but he is not very interested in any of that. I did explain to him that it is certainly reasonable to take a watch and wait approach here. I am fact recommended that we simply observe him clinically. I encouraged him to eat as best that he can. He will follow-up with me in 2 months and sooner if needed. Prior to that visit he will have a repeat CBC and complete metabolic profile.   Owens Loffler, MD [facility] 07/19/2016, 2:52 PM

## 2016-07-19 NOTE — Patient Instructions (Addendum)
You will have labs checked today in the basement lab.  Please head down after you check out with the front desk  (cbc). Please return to see Dr. Ardis Hughs on 09/22/15 11:15 am, sooner if needed (cbc, cmet the day prior-the order is in the system).

## 2016-07-27 ENCOUNTER — Institutional Professional Consult (permissible substitution): Payer: Medicare Other | Admitting: Internal Medicine

## 2016-07-27 ENCOUNTER — Telehealth: Payer: Self-pay | Admitting: Pulmonary Disease

## 2016-07-27 NOTE — Telephone Encounter (Signed)
Called and spoke to pt. Pt states he heard from [name] and the ONO since he made the call to [place] earlier today. Nothing further is needed.

## 2016-08-04 ENCOUNTER — Telehealth: Payer: Self-pay | Admitting: Pulmonary Disease

## 2016-08-04 NOTE — Telephone Encounter (Signed)
Spoke with the pt  He states needing ONO results  I did not see them in PM's lookat  I have called [facility] and spoke with Corene Cornea and advised asked that he fax the results  Will await fax

## 2016-08-08 ENCOUNTER — Encounter: Payer: Self-pay | Admitting: Family Medicine

## 2016-08-09 NOTE — Telephone Encounter (Signed)
Patient called back about results... Contact # [phone].[name]

## 2016-08-09 NOTE — Telephone Encounter (Signed)
Spoke with pt, aware that we are awaiting results from PM. ono report received by [facility], awaiting PM's recs.  PM please advise.  Thanks!

## 2016-08-10 NOTE — Telephone Encounter (Signed)
ONO 07/28/16 reviewed Test time- 7 hr 55 mins Lowest sat-81% Mean O2 sat 89.6% Time less than 88%- 2 hrs 35 mins. Report scanned into computer.   He still has some desats at night. Recommend that he use supplemental O2 at 2 LPM at night. OK to stop using it during the day

## 2016-08-13 NOTE — Telephone Encounter (Signed)
Called and spoke with pt and he is aware of results. Nothing further is needed.  

## 2016-08-30 ENCOUNTER — Other Ambulatory Visit: Payer: Self-pay | Admitting: Family Medicine

## 2016-09-02 ENCOUNTER — Encounter: Payer: Self-pay | Admitting: Interventional Radiology

## 2016-09-13 NOTE — Progress Notes (Signed)
HPI: FU CAD. He had CABG in 1986. He had a redo CABG in 1993. He had PCI in 2008 and 2010. He presented to the emergency room on 07/15/12 after falling at home. While in the emergency room he developed chest pain followed by ventricular tachycardia requiring cardioversion. He was taken emergently to the cath lab and treated with a BMS to the mid LAD. 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 Also with h/o atrial flutter; not anticoagulated due to falls and anemia. Last cardiac catheterization November 2013 showed a 50% ostial left main. The LAD was occluded. There was a 90% lesion in the mid LAD past the insertion of the LIMA which was patent. There was a 50-60% left circumflex. Right coronary artery was occluded. Saphenous vein graft to the obtuse marginal and saphenous vein graft to diagonal occluded. RIMA to the RCA patent. Ejection fraction 50%; patient had PCI of his LAD at that time. Nuclear study January 2016 showed ejection fraction 58%. There was a lateral infarct but no ischemia. TEE 8/17 showed normal LV function, mild AI and MR; no vegetation. Last echo 10/17 showed EF 45-50, grade 1 DD, trace AI, mild MR and mild LAE. 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 Had biliary stent. Since last seen, patient has occasional chest pain that he takes both Tums and nitroglycerin for. He thinks it is likely indigestion. He does not have exertional chest pain. He denies dyspnea on exertion, pedal edema. Occasional dizziness but no syncope.  Current Outpatient Prescriptions  Medication Sig Dispense Refill  . acetaminophen (TYLENOL) 500 MG tablet Take 1,000 mg by mouth every 6 (six) hours as needed for fever.    [name] albuterol (PROVENTIL HFA;VENTOLIN HFA) 108 (90 Base) MCG/ACT inhaler Inhale 2 puffs into the lungs every 6 (six) hours as needed for wheezing or shortness of breath. 1 Inhaler 0  . Amino Acids-Protein Hydrolys  (FEEDING SUPPLEMENT, PRO-STAT SUGAR FREE 64,) LIQD Take 30 mLs by mouth 2 (two) times daily. 900 mL 0  . aspirin EC 81 MG EC tablet Take 1 tablet (81 mg total) by mouth daily.    . budesonide-formoterol (SYMBICORT) 80-4.5 MCG/ACT inhaler Inhale 2 puffs into the lungs 2 (two) times daily.    . Ca Phosphate-Cholecalciferol (CALTRATE GUMMY BITES) 250-400 MG-UNIT CHEW Chew 2 tablets by mouth 2 (two) times daily.    . carbamazepine (TEGRETOL) 100 MG chewable tablet Chew 300 mg by mouth 2 (two) times daily.    [name] docusate sodium (COLACE) 100 MG capsule Take 100 mg by mouth daily.     . feeding supplement, ENSURE ENLIVE, (ENSURE ENLIVE) LIQD Take 237 mLs by mouth 2 (two) times daily between meals. 237 mL 12  . finasteride (PROSCAR) 5 MG tablet Take 5 mg by mouth every morning.     [name] ipratropium (ATROVENT HFA) 17 MCG/ACT inhaler Inhale 2 puffs into the lungs 2 (two) times daily as needed for wheezing.     [name] ipratropium (ATROVENT) 0.02 % nebulizer solution Take 2.5 mLs (0.5 mg total) by nebulization 2 (two) times daily as needed for wheezing or shortness of breath. 75 mL 12  . ipratropium-albuterol (DUONEB) 0.5-2.5 (3) MG/3ML SOLN Take 3 mLs by nebulization every 6 (six) hours as needed. (Patient taking differently: Take 3 mLs by nebulization every 6 (six) hours as needed (for SOB). ) 360 mL 2  . isosorbide mononitrate (IMDUR) 30 MG 24 hr tablet Take 1  tablet (30 mg total) by mouth daily. 30 tablet 0  . Melatonin 3 MG TABS Take 3 mg by mouth at bedtime.    . metoprolol succinate (TOPROL-XL) 12.5 mg TB24 24 hr tablet Take 12.5 mg by mouth daily.    . nitroGLYCERIN (NITROSTAT) 0.4 MG SL tablet Place 1 tablet (0.4 mg total) under the tongue every 5 (five) minutes as needed for chest pain (As needed for chest pain). 25 tablet 3  . OXYGEN Inhale 2 L/min into the lungs at bedtime.    . potassium chloride SA (K-DUR,KLOR-CON) 20 MEQ tablet Take 20 mEq by mouth daily.     . ranitidine (ZANTAC) 150 MG tablet Take 1  tablet (150 mg total) by mouth 2 (two) times daily. 180 tablet 3  . senna (SENOKOT) 8.6 MG tablet Take 1 tablet by mouth daily.     . sertraline (ZOLOFT) 100 MG tablet Take 150 mg by mouth every morning.     . tamsulosin (FLOMAX) 0.4 MG CAPS capsule Take 1 capsule (0.4 mg total) by mouth daily. 30 capsule 0  . traZODone (DESYREL) 50 MG tablet TAKE ONE TABLET BY MOUTH AT BEDTIME 30 tablet 3   No current facility-administered medications for this visit.      Past Medical History:  Diagnosis Date  . Anemia 2014   was on iron pills and then was able to come off  . Arthritis    "all over"  . Asthma       . Atrial flutter ([place])    a. diagnosed 03/2015  . Bacteremia 03/2016  . Complication of anesthesia    B/P dropped low and had to stay in recovery longer  . Coronary artery disease    a. s/p CABG 1986 b. redo CABG 1993 c. PTCA 2008 d. DES 2010  . Depression       . Diverticulosis   . GERD (gastroesophageal reflux disease)       . Hepatitis    hepatitis- long time ago, occupational contamination   . Hiatal hernia   . Hyperlipemia   . Hypertension       . Insomnia       . Kidney stones   . Myocardial infarction   . On home oxygen therapy    "2L; just at night" (01/31/2015)  . Peripheral neuropathy ([place])   . Peripheral vascular disease ([facility])    thrombus- in leg- many yrs. ago- ?R- coumadin x1 mth  . Pneumonia 1960's  . Presence of permanent cardiac pacemaker   . Prostate cancer [facility])    radiation therapy- 2002  . Systolic CHF ([place])       . VT (ventricular tachycardia) ([place]) 06/2012   a. in the setting of STEMI b. on amio during hospitalization    Past Surgical History:  Procedure Laterality Date  . APPENDECTOMY  1943  . BILIARY DRAINAGE  05/14/2016  . CATARACT EXTRACTION W/ INTRAOCULAR LENS  IMPLANT, BILATERAL    . COLONOSCOPY    . CORONARY ARTERY BYPASS GRAFT  1986;  1993   CABG X 5; CABG X 1  . CYSTOSCOPY W/ URETERAL STENT PLACEMENT Right 02/28/2013   Procedure:  CYSTOSCOPY WITH RETROGRADE PYELOGRAM/URETERAL STENT PLACEMENT;  Surgeon: Dutch Gray, MD;  Location: [facility];  Service: Urology;  Laterality: Right;  . CYSTOSCOPY WITH URETEROSCOPY AND STENT PLACEMENT Right 03/23/2013   Procedure: RIGHT URETEROSCOPY, LASER LITHO AND STENT PLACEMENT;  Surgeon: Fredricka Bonine, MD;  Location: [facility];  Service: Urology;  Laterality: Right;  .  ENDOSCOPIC RETROGRADE CHOLANGIOPANCREATOGRAPHY (ERCP) WITH PROPOFOL  04/30/2016  . EP IMPLANTABLE DEVICE N/A 04/11/2015   Procedure: Pacemaker Implant;  Surgeon: Will Meredith Leeds, MD;  Location: [place] CV LAB;  Service: Cardiovascular;  Laterality: N/A;  . ERCP N/A 04/30/2016   Procedure: ENDOSCOPIC RETROGRADE CHOLANGIOPANCREATOGRAPHY (ERCP);  Surgeon: Milus Banister, MD;  Location: [place];  Service: Endoscopy;  Laterality: N/A;  . FOOT FUSION Left 2000   heel  . FRACTURE SURGERY     heel- crushed -2002,  (hardware)   . HAMMER TOE SURGERY Right    little toe  . HARDWARE REMOVAL Right 2002   great toe; Dr. Sharol Given  . HOLMIUM LASER APPLICATION Right [id number]   Procedure: HOLMIUM LASER APPLICATION;  Surgeon: Fredricka Bonine, MD;  Location: [facility];  Service: Urology;  Laterality: Right;  . INGUINAL HERNIA REPAIR Left 01/31/2015   Procedure: OPEN REPAIR RECURRENT LEFT INGUINAL HERNIA;  Surgeon: Fanny Skates, MD;  Location: [place];  Service: General;  Laterality: Left;  . INSERTION OF MESH Left 01/31/2015   Procedure: INSERTION OF MESH;  Surgeon: Fanny Skates, MD;  Location: [place];  Service: General;  Laterality: Left;  . INTRAMEDULLARY (IM) NAIL INTERTROCHANTERIC Right 08/29/2013   Procedure: INTRAMEDULLARY (IM) NAIL INTERTROCHANTRIC;  Surgeon: Marianna Payment, MD;  Location: [place];  Service: Orthopedics;  Laterality: Right;  . IR GENERIC HISTORICAL  05/01/2016   IR INT EXT BILIARY DRAIN WITH CHOLANGIOGRAM 05/01/2016 Corrie Mckusick, DO [facility]  . IR GENERIC HISTORICAL  06/01/2016   IR BILIARY STENT(S)  EXISTING ACCESS INC DILATION CATH EXCHANGE 06/01/2016 Corrie Mckusick, DO [facility]  . IR GENERIC HISTORICAL  05/20/2016   IR RADIOLOGIST EVAL & MGMT 05/20/2016 Greggory Keen, MD [facility]  . JOINT REPLACEMENT     R great toe  . KNEE ARTHROSCOPY Right X 2  . LEFT AND RIGHT HEART CATHETERIZATION WITH CORONARY ANGIOGRAM N/A 07/15/2012   Procedure: LEFT AND RIGHT HEART CATHETERIZATION WITH CORONARY ANGIOGRAM;  Surgeon: Jettie Booze, MD;  Location: [facility] CATH LAB;  Service: Cardiovascular;  Laterality: N/A;  . LUMBAR LAMINECTOMY/DECOMPRESSION MICRODISCECTOMY  08/10/2011   Procedure: LUMBAR LAMINECTOMY/DECOMPRESSION MICRODISCECTOMY;  Surgeon: Cooper Render Pool;  Location: [place] [facility];  Service: Neurosurgery;  Laterality: Right;  RIGHT Lumbar five-sacral one LAMINECTOMY, MICRODISCECTOMY  . LUMBAR WOUND DEBRIDEMENT  08/22/2011   Procedure: LUMBAR WOUND DEBRIDEMENT;  Surgeon: Eustace Moore;  Location: [place] [facility];  Service: Neurosurgery;  Laterality: N/A;  Repair of CSF Leak requiring laminectomy  . PERCUTANEOUS CORONARY STENT INTERVENTION (PCI-S) N/A 07/15/2012   Procedure: PERCUTANEOUS CORONARY STENT INTERVENTION (PCI-S);  Surgeon: Jettie Booze, MD;  Location[facility] C[facility] LAB;  Service: Cardiovascular;  Laterality: N/A;  . RHINOPLASTY  [id number]  . TEE WITHOUT CARDIOVERSION N/A 04/28/2016   Procedure: TRANSESOPHAGEAL ECHOCARDIOGRAM (TEE);  Surgeon: Skeet Latch, MD;  Location: [facility] ENDOSCOPY;  Service: Cardiovascular;  Laterality: N/A;  . TONSILLECTOMY  1940  . TRANSURETHRAL RESECTION OF PROSTATE  x2    Social History   Social History  . Marital status: Married    Spouse name: N/A  . Number of children: N/A  . Years of education: N/A   Occupational History  . Not on file.   Social History Main Topics  . Smoking status: Never Smoker  . Smokeless tobacco: Never Used  . Alcohol use No  . Drug use: No  . Sexual activity: Not on file   Other Topics Concern  . Not on file    Social History  Narrative   Married, lives with spouse   2 children - daughter has MS, recent pulmonary and cardiac arrest   OCCUPATION: retired from the lab at [employer], MLT    Family History  Problem Relation Age of Onset  . Heart attack Mother   . Addison's disease Father   . Heart attack Sister   . Heart disease Brother   . Prostate cancer Brother   . Heart disease Sister   . Heart disease Brother   . Lung cancer Brother   . Anesthesia problems Neg Hx   . Hypotension Neg Hx   . Malignant hyperthermia Neg Hx   . Pseudochol deficiency Neg Hx     ROS: Diminished appetite but no fevers or chills, productive cough, hemoptysis, dysphasia, odynophagia, melena, hematochezia, dysuria, hematuria, rash, seizure activity, orthopnea, PND, pedal edema, claudication. Remaining systems are negative.  Physical Exam: Well-developed well-nourished in no acute distress.  Skin is warm and dry.  HEENT is normal.  Neck is supple.  Chest is clear to auscultation with normal expansion.  Cardiovascular exam is regular rate and rhythm. 2/6 systolic murmur LSB Abdominal exam nontender or distended. No masses palpated. Extremities show no edema. neuro grossly intact  ECG-atrial paced rhythm with left bundle branch block.  A/P  1 coronary artery disease-continue aspirin. His statin has been on hold because of liver issues. He has occasional indigestion but no exertional chest pain. 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 I would like to be conservative from a cardiac standpoint if possible.  2 hypertension-blood pressure controlled. Continue present medications.  3 history of atrial flutter-not on anticoagulation given history of falls and syncope. Continue beta blocker.  4 hyperlipidemia-continue diet.  5 syncope-no recurrent episodes.  6 pacemaker-followed by electrophysiology.  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  [name], MD

## 2016-09-14 ENCOUNTER — Ambulatory Visit (INDEPENDENT_AMBULATORY_CARE_PROVIDER_SITE_OTHER): Payer: Medicare Other | Admitting: Pulmonary Disease

## 2016-09-14 ENCOUNTER — Encounter: Payer: Self-pay | Admitting: Pulmonary Disease

## 2016-09-14 VITALS — BP 118/68 | HR 76 | Ht 70.0 in | Wt 166.4 lb

## 2016-09-14 DIAGNOSIS — J189 Pneumonia, unspecified organism: Secondary | ICD-10-CM | POA: Diagnosis not present

## 2016-09-14 DIAGNOSIS — J449 Chronic obstructive pulmonary disease, unspecified: Secondary | ICD-10-CM

## 2016-09-14 NOTE — Patient Instructions (Signed)
We get a chest x-ray today Continue Mucinex. Start saline nasal spray.  Return to clinic in 3 months

## 2016-09-14 NOTE — Progress Notes (Signed)
YUG JHA    TK:[id number]    [birth year]  Primary Care Physician:Elsia Nigel Sloop, DO  Referring Physician: Bufford Lope, DO [address] [place], [place] [address]  Chief complaint:   Follow up after recent admission with PNA and pleural effusion  HPI: [name] is a 83 year old with past medical history as below.   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 He started having spiking fevers since his surgical procedure. Gastric oncology services did not think the gallbladder was a source of the fever. Chest x-ray showed worsening right basilar opacification and infiltrates with a small effusion. Despite treatment with IV antibiotics and hydration he continued to worsen. CT chest 06/03/2016 showed large right-sided pleural effusion. He underwent thoracentesis 06/04/2016 and had 1.11 L of amber color exudate removed. He clinically was After the thoracentesis. This was thought to be secondary to mild lung injury from pneumonia versus reexpansion pulmonary edema. He has treated with IV Lasix and continuation of antibiotics.  Interim history: He continues to make a slow recovery. He has weaned himself off daytime oxygen. He continues to be on supplemental oxygen at night due to desaturations on overnight oximetry. For the past week he has complaints of some sinus pressure, congestion, cough with clear mucus. He denies any fevers, chills. Also has mild malaise with loss of appetite. 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 Ardis Hughs next week.  Outpatient Encounter Prescriptions as of 09/14/2016  Medication Sig  . acetaminophen (TYLENOL) 500 MG tablet Take 1,000 mg by mouth every 6 (six) hours as needed for fever.  [name] albuterol (PROVENTIL HFA;VENTOLIN HFA) 108 (90 Base) MCG/ACT  inhaler Inhale 2 puffs into the lungs every 6 (six) hours as needed for wheezing or shortness of breath.  . Amino Acids-Protein Hydrolys (FEEDING SUPPLEMENT, PRO-STAT SUGAR FREE 64,) LIQD Take 30 mLs by mouth 2 (two) times daily.  [name] aspirin EC 81 MG EC tablet Take 1 tablet (81 mg total) by mouth daily.  . budesonide-formoterol (SYMBICORT) 80-4.5 MCG/ACT inhaler Inhale 2 puffs into the lungs 2 (two) times daily.  . Ca Phosphate-Cholecalciferol (CALTRATE GUMMY BITES) 250-400 MG-UNIT CHEW Chew 2 tablets by mouth 2 (two) times daily.  . carbamazepine (TEGRETOL) 100 MG chewable tablet Chew 300 mg by mouth 2 (two) times daily.  [name] docusate sodium (COLACE) 100 MG capsule Take 100 mg by mouth daily.   . feeding supplement, ENSURE ENLIVE, (ENSURE ENLIVE) LIQD Take 237 mLs by mouth 2 (two) times daily between meals.  . finasteride (PROSCAR) 5 MG tablet Take 5 mg by mouth every morning.   [name] ipratropium (ATROVENT HFA) 17 MCG/ACT inhaler Inhale 2 puffs into the lungs 2 (two) times daily as needed for wheezing.   [name] ipratropium (ATROVENT) 0.02 % nebulizer solution Take 2.5 mLs (0.5 mg total) by nebulization 2 (two) times daily as needed for wheezing or shortness of breath.  [name] ipratropium-albuterol (DUONEB) 0.5-2.5 (3) MG/3ML SOLN Take 3 mLs by nebulization every 6 (six) hours as needed. (Patient taking differently: Take 3 mLs by nebulization every 6 (six) hours as needed (for SOB). )  . isosorbide mononitrate (IMDUR) 30 MG 24 hr tablet Take 1 tablet (30 mg total) by mouth daily.  . Melatonin 3 MG TABS Take 3 mg by  mouth at bedtime.  . metoprolol succinate (TOPROL-XL) 12.5 mg TB24 24 hr tablet Take 12.5 mg by mouth daily.  . nitroGLYCERIN (NITROSTAT) 0.4 MG SL tablet Place 1 tablet (0.4 mg total) under the tongue every 5 (five) minutes as needed for chest pain (As needed for chest pain).  . potassium chloride SA (K-DUR,KLOR-CON) 20 MEQ tablet Take 20 mEq by mouth daily.   . ranitidine (ZANTAC) 150 MG tablet Take 1  tablet (150 mg total) by mouth 2 (two) times daily.  [name] senna (SENOKOT) 8.6 MG tablet Take 1 tablet by mouth daily.   . sertraline (ZOLOFT) 100 MG tablet Take 150 mg by mouth every morning.   . tamsulosin (FLOMAX) 0.4 MG CAPS capsule Take 1 capsule (0.4 mg total) by mouth daily.  . traZODone (DESYREL) 50 MG tablet TAKE ONE TABLET BY MOUTH AT BEDTIME   No facility-administered encounter medications on file as of 09/14/2016.     Allergies as of 09/14/2016  . (No Known Allergies)    Past Medical History:  Diagnosis Date  . Anemia 2014   was on iron pills and then was able to come off  . Arthritis    "all over"  . Asthma       . Atrial flutter ([place])    a. diagnosed 03/2015  . Bacteremia 03/2016  . Complication of anesthesia    B/P dropped low and had to stay in recovery longer  . Coronary artery disease    a. s/p CABG 1986 b. redo CABG 1993 c. PTCA 2008 d. DES 2010  . Depression       . Diverticulosis   . GERD (gastroesophageal reflux disease)       . Hepatitis    hepatitis- long time ago, occupational contamination   . Hiatal hernia   . Hyperlipemia   . Hypertension       . Insomnia       . Kidney stones   . Myocardial infarction   . On home oxygen therapy    "2L; just at night" (01/31/2015)  . Peripheral neuropathy ([place])   . Peripheral vascular disease ([facility])    thrombus- in leg- many yrs. ago- ?R- coumadin x1 mth  . Pneumonia 1960's  . Presence of permanent cardiac pacemaker   . Prostate cancer [facility])    radiation therapy- 2002  . Systolic CHF ([place])       . VT (ventricular tachycardia) ([place]) 06/2012   a. in the setting of STEMI b. on amio during hospitalization    Past Surgical History:  Procedure Laterality Date  . APPENDECTOMY  1943  . BILIARY DRAINAGE  05/14/2016  . CATARACT EXTRACTION W/ INTRAOCULAR LENS  IMPLANT, BILATERAL    . COLONOSCOPY    . CORONARY ARTERY BYPASS GRAFT  1986;  1993   CABG X 5; CABG X 1  . CYSTOSCOPY W/ URETERAL STENT PLACEMENT Right  02/28/2013   Procedure: CYSTOSCOPY WITH RETROGRADE PYELOGRAM/URETERAL STENT PLACEMENT;  Surgeon: Dutch Gray, MD;  Location: [facility];  Service: Urology;  Laterality: Right;  . CYSTOSCOPY WITH URETEROSCOPY AND STENT PLACEMENT Right 03/23/2013   Procedure: RIGHT URETEROSCOPY, LASER LITHO AND STENT PLACEMENT;  Surgeon: Fredricka Bonine, MD;  Location: [facility];  Service: Urology;  Laterality: Right;  . ENDOSCOPIC RETROGRADE CHOLANGIOPANCREATOGRAPHY (ERCP) WITH PROPOFOL  04/30/2016  . EP IMPLANTABLE DEVICE N/A 04/11/2015   Procedure: Pacemaker Implant;  Surgeon: Will Meredith Leeds, MD;  Location: [facility] CV LAB;  Service: Cardiovascular;  Laterality: N/A;  . ERCP  N/A 04/30/2016   Procedure: ENDOSCOPIC RETROGRADE CHOLANGIOPANCREATOGRAPHY (ERCP);  Surgeon: Milus Banister, MD;  Location: [place];  Service: Endoscopy;  Laterality: N/A;  . FOOT FUSION Left 2000   heel  . FRACTURE SURGERY     heel- crushed -2002,  (hardware)   . HAMMER TOE SURGERY Right    little toe  . HARDWARE REMOVAL Right 2002   great toe; Dr. Sharol Given  . HOLMIUM LASER APPLICATION Right [id number]   Procedure: HOLMIUM LASER APPLICATION;  Surgeon: Fredricka Bonine, MD;  Location: [facility];  Service: Urology;  Laterality: Right;  . INGUINAL HERNIA REPAIR Left 01/31/2015   Procedure: OPEN REPAIR RECURRENT LEFT INGUINAL HERNIA;  Surgeon: Fanny Skates, MD;  Location: [facility];  Service: General;  Laterality: Left;  . INSERTION OF MESH Left 01/31/2015   Procedure: INSERTION OF MESH;  Surgeon: Fanny Skates, MD;  Location: [place];  Service: General;  Laterality: Left;  . INTRAMEDULLARY (IM) NAIL INTERTROCHANTERIC Right 08/29/2013   Procedure: INTRAMEDULLARY (IM) NAIL INTERTROCHANTRIC;  Surgeon: Marianna Payment, MD;  Location: [place];  Service: Orthopedics;  Laterality: Right;  . IR GENERIC HISTORICAL  05/01/2016   IR INT EXT BILIARY DRAIN WITH CHOLANGIOGRAM 05/01/2016 Corrie Mckusick, DO [facility]  . IR GENERIC HISTORICAL  06/01/2016     IR BILIARY STENT(S) EXISTING ACCESS INC DILATION CATH EXCHANGE 06/01/2016 Corrie Mckusick, DO [facility]  . IR GENERIC HISTORICAL  05/20/2016   IR RADIOLOGIST EVAL & MGMT 05/20/2016 Greggory Keen, MD [facility]  . JOINT REPLACEMENT     R great toe  . KNEE ARTHROSCOPY Right X 2  . LEFT AND RIGHT HEART CATHETERIZATION WITH CORONARY ANGIOGRAM N/A 07/15/2012   Procedure: LEFT AND RIGHT HEART CATHETERIZATION WITH CORONARY ANGIOGRAM;  Surgeon: Jettie Booze, MD;  Location: [facility] CATH LAB;  Service: Cardiovascular;  Laterality: N/A;  . LUMBAR LAMINECTOMY/DECOMPRESSION MICRODISCECTOMY  08/10/2011   Procedure: LUMBAR LAMINECTOMY/DECOMPRESSION MICRODISCECTOMY;  Surgeon: Cooper Render Pool;  Location: [place] [facility];  Service: Neurosurgery;  Laterality: Right;  RIGHT Lumbar five-sacral one LAMINECTOMY, MICRODISCECTOMY  . LUMBAR WOUND DEBRIDEMENT  08/22/2011   Procedure: LUMBAR WOUND DEBRIDEMENT;  Surgeon: Eustace Moore;  Location: [place] [facility]UR[facility] ORS;  Service: Neurosurgery;  Laterality: N/A;  Repair of CSF Leak requiring laminectomy  . PERCUTANEOUS CORONARY STENT INTERVENTION (PCI-S) N/A 07/15/2012   Procedure: PERCUTANEOUS CORONARY STENT INTERVENTION (PCI-S);  Surgeon: Jettie Booze, MD;  Location: [facility] CATH LAB;  Service: Cardiovascular;  Laterality: N/A;  . RHINOPLASTY  [id number]  . TEE WITHOUT CARDIOVERSION N/A 04/28/2016   Procedure: TRANSESOPHAGEAL ECHOCARDIOGRAM (TEE);  Surgeon: Skeet Latch, MD;  Location: [facility] ENDOSCOPY;  Service: Cardiovascular;  Laterality: N/A;  . TONSILLECTOMY  1940  . TRANSURETHRAL RESECTION OF PROSTATE  x2    Family History  Problem Relation Age of Onset  . Heart attack Mother   . Addison's disease Father   . Heart attack Sister   . Heart disease Brother   . Prostate cancer Brother   . Heart disease Sister   . Heart disease Brother   . Lung cancer Brother   . Anesthesia problems Neg Hx   . Hypotension Neg Hx   . Malignant hyperthermia Neg Hx   . Pseudochol  deficiency Neg Hx     Social History   Social History  . Marital status: Married    Spouse name: N/A  . Number of children: N/A  . Years of education: N/A   Occupational History  . Not on file.   Social  History Main Topics  . Smoking status: Never Smoker  . Smokeless tobacco: Never Used  . Alcohol use No  . Drug use: No  . Sexual activity: Not on file   Other Topics Concern  . Not on file   Social History Narrative   Married, lives with spouse   2 children - daughter has MS, recent pulmonary and cardiac arrest   OCCUPATION: retired from the lab at [employer], ML[employer]     Review of systems: Review of Systems  Constitutional: Negative for fever and chills.  HENT: Negative.   Eyes: Negative for blurred vision.  Respiratory: as per HPI  Cardiovascular: Negative for chest pain and palpitations.  Gastrointestinal: Negative for vomiting, diarrhea, blood per rectum. Genitourinary: Negative for dysuria, urgency, frequency and hematuria.  Musculoskeletal: Negative for myalgias, back pain and joint pain.  Skin: Negative for itching and rash.  Neurological: Negative for dizziness, tremors, focal weakness, seizures and loss of consciousness.  Endo/Heme/Allergies: Negative for environmental allergies.  Psychiatric/Behavioral: Negative for depression, suicidal ideas and hallucinations.  All other systems reviewed and are negative.   Physical Exam: Blood pressure 118/68, pulse 76, height 5\' 10"  (1.778 m), weight 166 lb 6.4 oz (75.5 kg), SpO2 95 %. Gen:      No acute distress HEENT:  EOMI, sclera anicteric Neck:     No masses; no thyromegaly Lungs:    Clear to auscultation bilaterally; normal respiratory effort CV:         Regular rate and rhythm; no murmurs Abd:      + bowel sounds; soft, non-tender; no palpable masses, no distension Ext:    No edema; adequate peripheral perfusion Skin:      Warm and dry; no rash Neuro: alert and oriented x 3 Psych: normal mood and  affect  Data Reviewed: PFTs 09/12/12 FVC 2.18 [80%] FEV1 2.20 [90%) F/F 69 TLC 90% DLCO 94% Mild obstructive lung defect positive broncho-dilator response.  CTA 06/03/16-no PE, large right located effusion. CXR 06/07/16- persistence of RML opacity, effusion.  CXR 07/29/16-resolution of right lung opacity, effusion All images reviewed.   Pleural studies 06/04/16 No malignant cells identified. All culture data negative. LDH 170, total protein 3.4, triglycerides 24, pH 8 WBC 1239, 10% lymphs, 70% neutrophils, 20% monocyte macrophage  ONO 07/28/16 reviewed Test time- 7 hr 55 mins Lowest sat-81% Mean O2 sat 89.6% Time less than 88%- 2 hrs 35 mins.  Assessment:  Recent admission for HCAP after surgery for duodenal adenoma with by biliary obstruction. Located right pleural effusion status post thoracentesis [name] continues to improve slowly. He is off oxygen during the daytime but continues to use 2 L at night due to desaturations noted during overnight oximetry. His chief complaints are some sinus congestion and cough with clear mucus. I'll get a chest x-ray to make sure he is not developing a lung infiltrate again. I do not believe we need to treat him with antibiotics at present and he will continue to use Mucinex over-the-counter. He'll also start using saline nasal sprays  He has a diagnosis of COPD from the past but only minimal obstruction on PFTs in 2014. He does not have a smoking history. I will order pulmonary function tests and continue on Symbicort inhaler.  Plan/Recommendations: - Chest X ray.  - Continue supplemental O2 at night, symbicort.  - Continue mucinex and saline nasal spray.  Marshell Garfinkel MD [facility] Pager [phone] 09/14/2016, 4:29 PM  CC: Bufford Lope, DO

## 2016-09-17 ENCOUNTER — Other Ambulatory Visit (INDEPENDENT_AMBULATORY_CARE_PROVIDER_SITE_OTHER): Payer: Medicare Other

## 2016-09-17 ENCOUNTER — Ambulatory Visit (INDEPENDENT_AMBULATORY_CARE_PROVIDER_SITE_OTHER)
Admission: RE | Admit: 2016-09-17 | Discharge: 2016-09-17 | Disposition: A | Discharge status: Home / Self Care | Payer: Medicare Other | Source: Ambulatory Visit | Attending: Pulmonary Disease | Admitting: Pulmonary Disease

## 2016-09-17 DIAGNOSIS — K831 Obstruction of bile duct: Secondary | ICD-10-CM

## 2016-09-17 DIAGNOSIS — J189 Pneumonia, unspecified organism: Secondary | ICD-10-CM | POA: Diagnosis not present

## 2016-09-17 DIAGNOSIS — J449 Chronic obstructive pulmonary disease, unspecified: Secondary | ICD-10-CM | POA: Diagnosis not present

## 2016-09-17 LAB — CBC WITH DIFFERENTIAL/PLATELET
BASOS PCT: 0.5 % (ref 0.0–3.0)
Basophils Absolute: 0 10*3/uL (ref 0.0–0.1)
EOS ABS: 0 10*3/uL (ref 0.0–0.7)
Eosinophils Relative: 0.6 % (ref 0.0–5.0)
HEMATOCRIT: 31 % — AB (ref 39.0–52.0)
Hemoglobin: 10.3 g/dL — ABNORMAL LOW (ref 13.0–17.0)
LYMPHS ABS: 2.4 10*3/uL (ref 0.7–4.0)
LYMPHS PCT: 37 % (ref 12.0–46.0)
MCHC: 33.1 g/dL (ref 30.0–36.0)
MCV: 86.5 fl (ref 78.0–100.0)
MONOS PCT: 6.5 % (ref 3.0–12.0)
Monocytes Absolute: 0.4 10*3/uL (ref 0.1–1.0)
NEUTROS ABS: 3.6 10*3/uL (ref 1.4–7.7)
NEUTROS PCT: 55.4 % (ref 43.0–77.0)
PLATELETS: 289 10*3/uL (ref 150.0–400.0)
RBC: 3.58 Mil/uL — ABNORMAL LOW (ref 4.22–5.81)
RDW: 17.4 % — AB (ref 11.5–15.5)
WBC: 6.4 10*3/uL (ref 4.0–10.5)

## 2016-09-21 ENCOUNTER — Encounter: Payer: Self-pay | Admitting: Gastroenterology

## 2016-09-21 ENCOUNTER — Encounter: Payer: Self-pay | Admitting: Cardiology

## 2016-09-21 ENCOUNTER — Ambulatory Visit (INDEPENDENT_AMBULATORY_CARE_PROVIDER_SITE_OTHER): Payer: Medicare Other | Admitting: Gastroenterology

## 2016-09-21 VITALS — BP 100/50 | HR 84 | Ht 66.0 in | Wt 165.0 lb

## 2016-09-21 DIAGNOSIS — D490 Neoplasm of unspecified behavior of digestive system: Secondary | ICD-10-CM

## 2016-09-21 DIAGNOSIS — J189 Pneumonia, unspecified organism: Secondary | ICD-10-CM | POA: Diagnosis not present

## 2016-09-21 NOTE — Patient Instructions (Addendum)
Previsit testing in early to [facility] March to consider elective EGD at [facility] with MAC sedation.  We will call you with an appointment date and time

## 2016-09-21 NOTE — Progress Notes (Signed)
Review of pertinent gastrointestinal problems: 1. 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 Ardis Hughs 04/2016 but stent could notbe placed so he underwent an internal/external biliary drain placement on September 2.  05/2016 the IR drain was replaced with an uncovered metal stent across the distal common bile duct/ampullary tumor with bedside removal of the internal/external drain after confirmation of relief of the obstruction by IR.  HPI: This is a   very pleasant 83 year old man whom I last saw about 2 months ago. He is here with his wife today. Chief complaint is duodenal neoplasm, oxygen requirement at night  Not enjoying his food very much.    Poor appetite.  When he eats has slight nausea but no vomiting or abd pains.  Has intermittent abd pains, twice daily.  Mid abdomen, across the right.  No jaundice.  No fevers or chills.  Slightly constipated.  He is on oxygen at night.  Walking with a walker.  He saw his pulmonologist earlier this month and they're planning a repeat visit in 3 months from now  2 weeks ago labs from [place]  (per patient showed normal LFTs except slightly ele  Ate sausage/egg today.  Eating 2 ensure daily.  Weight is up 4 pounds since last visit here 2 months ago.  ROS: complete GI ROS as described in HPI.  Constitutional:  No unintentional weight loss   Past Medical History:  Diagnosis Date  . Anemia 2014   was on iron pills and then was able to come off  . Arthritis    "all over"  . Asthma       . Atrial flutter ([place])    a. diagnosed 03/2015  . Bacteremia 03/2016  . Complication of anesthesia    B/P dropped low and had to stay in recovery longer  . Coronary artery disease    a. s/p CABG 1986 b. redo  CABG 1993 c. PTCA 2008 d. DES 2010  . Depression       . Diverticulosis   . GERD (gastroesophageal reflux disease)       . Hepatitis    hepatitis- long time ago, occupational contamination   . Hiatal hernia   . Hyperlipemia   . Hypertension       . Insomnia       . Kidney stones   . Myocardial infarction   . On home oxygen therapy    "2L; just at night" (01/31/2015)  . Peripheral neuropathy ([place])   . Peripheral vascular disease (H[facility])    thrombus- in leg- many yrs. ago- ?R- coumadin x1 mth  . Pneumonia 1960's  . Presence of permanent cardiac pacemaker   . Prostate cancer [facility])    radiation therapy- 2002  . Systolic CHF ([place])       . VT (ventricular tachycardia) ([place]) 06/2012   a. in the setting of STEMI b. on amio during hospitalization    Past Surgical History:  Procedure Laterality Date  . APPENDECTOMY  1943  . BILIARY DRAINAGE  05/14/2016  . CATARACT EXTRACTION W/ INTRAOCULAR LENS  IMPLANT, BILATERAL    . COLONOSCOPY    . CORONARY ARTERY BYPASS GRAFT  1986;  1993   CABG X 5; CABG X 1  . CYSTOSCOPY W/ URETERAL STENT PLACEMENT Right 02/28/2013   Procedure: CYSTOSCOPY  WITH RETROGRADE PYELOGRAM/URETERAL STENT PLACEMENT;  Surgeon: Dutch Gray, MD;  Location: [facility];  Service: Urology;  Laterality: Right;  . CYSTOSCOPY WITH URETEROSCOPY AND STENT PLACEMENT Right 03/23/2013   Procedure: RIGHT URETEROSCOPY, LASER LITHO AND STENT PLACEMENT;  Surgeon: Fredricka Bonine, MD;  Location: [facility];  Service: Urology;  Laterality: Right;  . ENDOSCOPIC RETROGRADE CHOLANGIOPANCREATOGRAPHY (ERCP) WITH PROPOFOL  04/30/2016  . EP IMPLANTABLE DEVICE N/A 04/11/2015   Procedure: Pacemaker Implant;  Surgeon: Will Meredith Leeds, MD;  Location: [place] CV LAB;  Service: Cardiovascular;  Laterality: N/A;  . ERCP N/A 04/30/2016   Procedure: ENDOSCOPIC RETROGRADE CHOLANGIOPANCREATOGRAPHY (ERCP);  Surgeon: Milus Banister, MD;  Location: [place];  Service: Endoscopy;  Laterality: N/A;  .  FOOT FUSION Left 2000   heel  . FRACTURE SURGERY     heel- crushed -2002,  (hardware)   . HAMMER TOE SURGERY Right    little toe  . HARDWARE REMOVAL Right 2002   great toe; Dr. Sharol Given  . HOLMIUM LASER APPLICATION Right [id number]   Procedure: HOLMIUM LASER APPLICATION;  Surgeon: Fredricka Bonine, MD;  Location: [facility];  Service: Urology;  Laterality: Right;  . INGUINAL HERNIA REPAIR Left 01/31/2015   Procedure: OPEN REPAIR RECURRENT LEFT INGUINAL HERNIA;  Surgeon: Fanny Skates, MD;  Location: [place];  Service: General;  Laterality: Left;  . INSERTION OF MESH Left 01/31/2015   Procedure: INSERTION OF MESH;  Surgeon: Fanny Skates, MD;  Location: [place];  Service: General;  Laterality: Left;  . INTRAMEDULLARY (IM) NAIL INTERTROCHANTERIC Right 08/29/2013   Procedure: INTRAMEDULLARY (IM) NAIL INTERTROCHANTRIC;  Surgeon: Marianna Payment, MD;  Location: [place];  Service: Orthopedics;  Laterality: Right;  . IR GENERIC HISTORICAL  05/01/2016   IR INT EXT BILIARY DRAIN WITH CHOLANGIOGRAM 05/01/2016 Corrie Mckusick, DO [facility]  . IR GENERIC HISTORICAL  06/01/2016   IR BILIARY STENT(S) EXISTING ACCESS INC DILATION CATH EXCHANGE 06/01/2016 Corrie Mckusick, DO [facility]  . IR GENERIC HISTORICAL  05/20/2016   IR RADIOLOGIST EVAL & MGMT 05/20/2016 Greggory Keen, MD [facility]  . JOINT REPLACEMENT     R great toe  . KNEE ARTHROSCOPY Right X 2  . LEFT AND RIGHT HEART CATHETERIZATION WITH CORONARY ANGIOGRAM N/A 07/15/2012   Procedure: LEFT AND RIGHT HEART CATHETERIZATION WITH CORONARY ANGIOGRAM;  Surgeon: Jettie Booze, MD;  Location: [facility] CATH LAB;  Service: Cardiovascular;  Laterality: N/A;  . LUMBAR LAMINECTOMY/DECOMPRESSION MICRODISCECTOMY  08/10/2011   Procedure: LUMBAR LAMINECTOMY/DECOMPRESSION MICRODISCECTOMY;  Surgeon: Cooper Render Pool;  Location: [place] [facility]URO ORS;  Service: Neurosurgery;  Laterality: Right;  RIGHT Lumbar five-sacral one LAMINECTOMY, MICRODISCECTOMY  . LUMBAR WOUND DEBRIDEMENT   08/22/2011   Procedure: LUMBAR WOUND DEBRIDEMENT;  Surgeon: Eustace Moore;  Location: [place] [facility]UR[facility] ORS;  Service: Neurosurgery;  Laterality: N/A;  Repair of CSF Leak requiring laminectomy  . PERCUTANEOUS CORONARY STENT INTERVENTION (PCI-S) N/A 07/15/2012   Procedure: PERCUTANEOUS CORONARY STENT INTERVENTION (PCI-S);  Surgeon: Jettie Booze, MD;  Location: [facility] LAB;  Service: Cardiovascular;  Laterality: N/A;  . RHINOPLASTY  [id number]  . TEE WITHOUT CARDIOVERSION N/A 04/28/2016   Procedure: TRANSESOPHAGEAL ECHOCARDIOGRAM (TEE);  Surgeon: Skeet Latch, MD;  Location: [facility] ENDOSCOPY;  Service: Cardiovascular;  Laterality: N/A;  . TONSILLECTOMY  1940  . TRANSURETHRAL RESECTION OF PROSTATE  x2    Current Outpatient Prescriptions  Medication Sig Dispense Refill  . acetaminophen (TYLENOL) 500 MG tablet Take 1,000 mg by mouth every 6 (six) hours as needed for fever.    [name]  albuterol (PROVENTIL HFA;VENTOLIN HFA) 108 (90 Base) MCG/ACT inhaler Inhale 2 puffs into the lungs every 6 (six) hours as needed for wheezing or shortness of breath. 1 Inhaler 0  . Amino Acids-Protein Hydrolys (FEEDING SUPPLEMENT, PRO-STAT SUGAR FREE 64,) LIQD Take 30 mLs by mouth 2 (two) times daily. 900 mL 0  . aspirin EC 81 MG EC tablet Take 1 tablet (81 mg total) by mouth daily.    . budesonide-formoterol (SYMBICORT) 80-4.5 MCG/ACT inhaler Inhale 2 puffs into the lungs 2 (two) times daily.    . Ca Phosphate-Cholecalciferol (CALTRATE GUMMY BITES) 250-400 MG-UNIT CHEW Chew 2 tablets by mouth 2 (two) times daily.    . carbamazepine (TEGRETOL) 100 MG chewable tablet Chew 300 mg by mouth 2 (two) times daily.    [name] docusate sodium (COLACE) 100 MG capsule Take 100 mg by mouth daily.     . feeding supplement, ENSURE ENLIVE, (ENSURE ENLIVE) LIQD Take 237 mLs by mouth 2 (two) times daily between meals. 237 mL 12  . finasteride (PROSCAR) 5 MG tablet Take 5 mg by mouth every morning.     [name] ipratropium (ATROVENT HFA) 17 MCG/ACT inhaler  Inhale 2 puffs into the lungs 2 (two) times daily as needed for wheezing.     [name] ipratropium (ATROVENT) 0.02 % nebulizer solution Take 2.5 mLs (0.5 mg total) by nebulization 2 (two) times daily as needed for wheezing or shortness of breath. 75 mL 12  . ipratropium-albuterol (DUONEB) 0.5-2.5 (3) MG/3ML SOLN Take 3 mLs by nebulization every 6 (six) hours as needed. (Patient taking differently: Take 3 mLs by nebulization every 6 (six) hours as needed (for SOB). ) 360 mL 2  . isosorbide mononitrate (IMDUR) 30 MG 24 hr tablet Take 1 tablet (30 mg total) by mouth daily. 30 tablet 0  . Melatonin 3 MG TABS Take 3 mg by mouth at bedtime.    . metoprolol succinate (TOPROL-XL) 12.5 mg TB24 24 hr tablet Take 12.5 mg by mouth daily.    . nitroGLYCERIN (NITROSTAT) 0.4 MG SL tablet Place 1 tablet (0.4 mg total) under the tongue every 5 (five) minutes as needed for chest pain (As needed for chest pain). 25 tablet 3  . potassium chloride SA (K-DUR,KLOR-CON) 20 MEQ tablet Take 20 mEq by mouth daily.     . ranitidine (ZANTAC) 150 MG tablet Take 1 tablet (150 mg total) by mouth 2 (two) times daily. 180 tablet 3  . senna (SENOKOT) 8.6 MG tablet Take 1 tablet by mouth daily.     . sertraline (ZOLOFT) 100 MG tablet Take 150 mg by mouth every morning.     . tamsulosin (FLOMAX) 0.4 MG CAPS capsule Take 1 capsule (0.4 mg total) by mouth daily. 30 capsule 0  . traZODone (DESYREL) 50 MG tablet TAKE ONE TABLET BY MOUTH AT BEDTIME 30 tablet 3   No current facility-administered medications for this visit.     Allergies as of 09/21/2016  . (No Known Allergies)    Family History  Problem Relation Age of Onset  . Heart attack Mother   . Addison's disease Father   . Heart attack Sister   . Heart disease Brother   . Prostate cancer Brother   . Heart disease Sister   . Heart disease Brother   . Lung cancer Brother   . Anesthesia problems Neg Hx   . Hypotension Neg Hx   . Malignant hyperthermia Neg Hx   . Pseudochol  deficiency Neg Hx     Social History  Social History  . Marital status: Married    Spouse name: N/A  . Number of children: N/A  . Years of education: N/A   Occupational History  . Not on file.   Social History Main Topics  . Smoking status: Never Smoker  . Smokeless tobacco: Never Used  . Alcohol use No  . Drug use: No  . Sexual activity: Not on file   Other Topics Concern  . Not on file   Social History Narrative   Married, lives with spouse   2 children - daughter has MS, recent pulmonary and cardiac arrest   OCCUPATION: retired from the lab at [employer], ML[employer]     Physical Exam: BP (!) 100/50 (BP Location: Left Arm, Patient Position: Sitting, Cuff Size: Normal)   Pulse 84   Ht 5\' 6"  (1.676 m) Comment: height measured without shoes  Wt 165 lb (74.8 kg)   BMI 26.63 kg/m  Constitutional: generally well-appearing, frail Psychiatric: alert and oriented x3 Abdomen: soft, nontender, nondistended, no obvious ascites, no peritoneal signs, normal bowel sounds No peripheral edema noted in lower extremities  Assessment and plan: 83 y.o. male with duodenal neoplasm, recovering from pneumonia, general frailty at 83 years old  He has a duodenal neoplasm, treated with interventional radiology bile duct stenting. See the above discussion. We had a long discussion today about the pros and cons of repeat biopsy. He would like to know for certain if he has cancer or not I can certainly understand that. He is however on oxygen at night since he is still recovering from pneumonia. He is generally frail as well. I'm not sure that it is such a good idea to repeat upper endoscopy with sedation electively in this circumstance. After some back and forth we eventually decided that he will meet with preop, previsit testing at [facility] to see what the anesthesia providers feel about elective upper endoscopy in this case. If they are willing to help with sedation, the patient is  willing to undergo the risk of the procedure and the sedation and I'm happy to repeat biopsies.   Greater than 50% of this visit was spent in direct face-to-face counseling.  Total time of this visit was 41min.   Please see the "Patient Instructions" section for addition details about the plan.  Owens Loffler, MD [facility] 09/21/2016, 11:06 AM

## 2016-09-24 ENCOUNTER — Encounter: Payer: Self-pay | Admitting: Family Medicine

## 2016-09-24 ENCOUNTER — Encounter: Payer: Self-pay | Admitting: Cardiology

## 2016-09-24 ENCOUNTER — Ambulatory Visit (INDEPENDENT_AMBULATORY_CARE_PROVIDER_SITE_OTHER): Payer: Medicare Other | Admitting: Cardiology

## 2016-09-24 VITALS — BP 110/60 | HR 76 | Ht 67.0 in | Wt 164.0 lb

## 2016-09-24 DIAGNOSIS — E7800 Pure hypercholesterolemia, unspecified: Secondary | ICD-10-CM

## 2016-09-24 DIAGNOSIS — I10 Essential (primary) hypertension: Secondary | ICD-10-CM | POA: Diagnosis not present

## 2016-09-24 DIAGNOSIS — I2510 Atherosclerotic heart disease of native coronary artery without angina pectoris: Secondary | ICD-10-CM

## 2016-09-24 DIAGNOSIS — R072 Precordial pain: Secondary | ICD-10-CM

## 2016-09-24 NOTE — Patient Instructions (Signed)
Your physician wants you to follow-up in: 6 MONTHS WITH DR CRENSHAW You will receive a reminder letter in the mail two months in advance. If you don't receive a letter, please call our office to schedule the follow-up appointment.   If you need a refill on your cardiac medications before your next appointment, please call your pharmacy.  

## 2016-10-06 ENCOUNTER — Encounter: Payer: Self-pay | Admitting: Cardiology

## 2016-10-06 ENCOUNTER — Ambulatory Visit (INDEPENDENT_AMBULATORY_CARE_PROVIDER_SITE_OTHER): Payer: Medicare Other | Admitting: Cardiology

## 2016-10-06 VITALS — BP 110/66 | HR 85 | Ht 67.0 in | Wt 165.2 lb

## 2016-10-06 DIAGNOSIS — I495 Sick sinus syndrome: Secondary | ICD-10-CM | POA: Diagnosis not present

## 2016-10-06 LAB — CUP PACEART INCLINIC DEVICE CHECK
Battery Impedance: 110 Ohm
Battery Remaining Longevity: 161 mo
Battery Voltage: 2.78 V
Brady Statistic AP VP Percent: 0 %
Brady Statistic AP VS Percent: 53 %
Brady Statistic AS VS Percent: 47 %
Implantable Lead Implant Date: 20160812
Implantable Lead Location: 753859
Implantable Pulse Generator Implant Date: 20160812
Lead Channel Impedance Value: 514 Ohm
Lead Channel Pacing Threshold Amplitude: 0.625 V
Lead Channel Pacing Threshold Amplitude: 1.375 V
Lead Channel Pacing Threshold Pulse Width: 0.4 ms
Lead Channel Pacing Threshold Pulse Width: 0.4 ms
Lead Channel Setting Pacing Amplitude: 2.75 V
MDC IDC LEAD IMPLANT DT: 20160812
MDC IDC LEAD LOCATION: 753860
MDC IDC MSMT LEADCHNL RA PACING THRESHOLD AMPLITUDE: 0.75 V
MDC IDC MSMT LEADCHNL RA PACING THRESHOLD PULSEWIDTH: 0.4 ms
MDC IDC MSMT LEADCHNL RV IMPEDANCE VALUE: 474 Ohm
MDC IDC MSMT LEADCHNL RV PACING THRESHOLD AMPLITUDE: 1.5 V
MDC IDC MSMT LEADCHNL RV PACING THRESHOLD PULSEWIDTH: 0.4 ms
MDC IDC MSMT LEADCHNL RV SENSING INTR AMPL: 5.6 mV
MDC IDC SESS DTM: 20180207120058
MDC IDC SET LEADCHNL RA PACING AMPLITUDE: 2 V
MDC IDC SET LEADCHNL RV PACING PULSEWIDTH: 0.4 ms
MDC IDC SET LEADCHNL RV SENSING SENSITIVITY: 2 mV
MDC IDC STAT BRADY AS VP PERCENT: 0 %

## 2016-10-06 NOTE — Progress Notes (Signed)
Electrophysiology Office Note   Date:  10/06/2016   ID:  [name], DOB 1925/07/13, MRN [id number]  PCP:  Bufford Lope, DO  Cardiologist:  Mare Ferrari Primary Electrophysiologist:  Constance Haw, MD    Chief Complaint  Patient presents with  . Pacemaker Check    Tachy-Brady     History of Present Illness: [name] is a 83 y.o. male who presents today for electrophysiology evaluation.   He had CABG in 1986. He had a redo CABG in 1993. He had PTCA with stent in October 2008. He had a DES in February 2010. He had a small non-Q MI in August 2010.He presented to the emergency room on 07/15/12 after falling at home. While in the emergency room he developed chest pain followed by ventricular tachycardia requiring cardioversion. He was taken emergently to the cath lab and treated with a BMS to the mid LAD. He was treated initially with amiodarone which was subsequently stopped prior to discharge secondary to side effects. His echocardiogram November 2013 showed ejection fraction 40-45% and moderate left atrial enlargement. He has chronic LBBB. In Jan 2016 he had an episode of syncope and chest pain. Myoview showed scar. He has had bradycardia documented in the past. He presented to the ED 04/10/15 with tachycardia and was found to be in A flutter with variable VR. CHADs VASc =4. He also had periods of marked bradycardia. He was felt to have tachybradycardia syndrome. He underwent placement of a MDT pacemaker [id number] without complications.    Today, he denies symptoms of palpitations, chest pain, orthopnea, PND, lower extremity edema, claudication, dizziness, presyncope, syncope, bleeding, or neurologic sequela. The patient is tolerating medications without difficulties. From a cardiac standpoint, he is doing well without chest pain, shortness of breath. He does have an intestinal obstruction that could possibly be cancerous that he is having worked up currently.   Past Medical  History:  Diagnosis Date  . Anemia 2014   was on iron pills and then was able to come off  . Arthritis    "all over"  . Asthma       . Atrial flutter ([place])    a. diagnosed 03/2015  . Bacteremia 03/2016  . Complication of anesthesia    B/P dropped low and had to stay in recovery longer  . Coronary artery disease    a. s/p CABG 1986 b. redo CABG 1993 c. PTCA 2008 d. DES 2010  . Depression       . Diverticulosis   . GERD (gastroesophageal reflux disease)       . Hepatitis    hepatitis- long time ago, occupational contamination   . Hiatal hernia   . Hyperlipemia   . Hypertension       . Insomnia       . Kidney stones   . Myocardial infarction   . On home oxygen therapy    "2L; just at night" (01/31/2015)  . Peripheral neuropathy ([place])   . Peripheral vascular disease (H[facility])    thrombus- in leg- many yrs. ago- ?R- coumadin x1 mth  . Pneumonia 1960's  . Presence of permanent cardiac pacemaker   . Prostate cancer [facility])    radiation therapy- 2002  . Systolic CHF ([place])       . VT (ventricular tachycardia) ([place]) 06/2012   a. in the setting of STEMI b. on amio during hospitalization   Past Surgical History:  Procedure Laterality Date  . APPENDECTOMY  1943  .  BILIARY DRAINAGE  05/14/2016  . CATARACT EXTRACTION W/ INTRAOCULAR LENS  IMPLANT, BILATERAL    . COLONOSCOPY    . CORONARY ARTERY BYPASS GRAFT  1986;  1993   CABG X 5; CABG X 1  . CYSTOSCOPY W/ URETERAL STENT PLACEMENT Right 02/28/2013   Procedure: CYSTOSCOPY WITH RETROGRADE PYELOGRAM/URETERAL STENT PLACEMENT;  Surgeon: Dutch Gray, MD;  Location: [facility];  Service: Urology;  Laterality: Right;  . CYSTOSCOPY WITH URETEROSCOPY AND STENT PLACEMENT Right 03/23/2013   Procedure: RIGHT URETEROSCOPY, LASER LITHO AND STENT PLACEMENT;  Surgeon: Fredricka Bonine, MD;  Location: [facility];  Service: Urology;  Laterality: Right;  . ENDOSCOPIC RETROGRADE CHOLANGIOPANCREATOGRAPHY (ERCP) WITH PROPOFOL  04/30/2016  . EP IMPLANTABLE  DEVICE N/A 04/11/2015   Procedure: Pacemaker Implant;  Surgeon: Mariyanna Mucha Meredith Leeds, MD;  Location: [place] CV LAB;  Service: Cardiovascular;  Laterality: N/A;  . ERCP N/A 04/30/2016   Procedure: ENDOSCOPIC RETROGRADE CHOLANGIOPANCREATOGRAPHY (ERCP);  Surgeon: Milus Banister, MD;  Location: [place];  Service: Endoscopy;  Laterality: N/A;  . FOOT FUSION Left 2000   heel  . FRACTURE SURGERY     heel- crushed -2002,  (hardware)   . HAMMER TOE SURGERY Right    little toe  . HARDWARE REMOVAL Right 2002   great toe; Dr. Sharol Given  . HOLMIUM LASER APPLICATION Right [id number]   Procedure: HOLMIUM LASER APPLICATION;  Surgeon: Fredricka Bonine, MD;  [facility];  Service: Urology;  Laterality: Right;  . INGUINAL HERNIA REPAIR Left 01/31/2015   Procedure: OPEN REPAIR RECURRENT LEFT INGUINAL HERNIA;  Surgeon: Fanny Skates, MD;  Location: [place];  Service: General;  Laterality: Left;  . INSERTION OF MESH Left 01/31/2015   Procedure: INSERTION OF MESH;  Surgeon: Fanny Skates, MD;  Location: [place];  Service: General;  Laterality: Left;  . INTRAMEDULLARY (IM) NAIL INTERTROCHANTERIC Right 08/29/2013   Procedure: INTRAMEDULLARY (IM) NAIL INTERTROCHANTRIC;  Surgeon: Marianna Payment, MD;  Location: [place];  Service: Orthopedics;  Laterality: Right;  . IR GENERIC HISTORICAL  05/01/2016   IR INT EXT BILIARY DRAIN WITH CHOLANGIOGRAM 05/01/2016 Corrie Mckusick, DO [facility]  . IR GENERIC HISTORICAL  06/01/2016   IR BILIARY STENT(S) EXISTING ACCESS INC DILATION CATH EXCHANGE 06/01/2016 Corrie Mckusick, DO [facility]  . IR GENERIC HISTORICAL  05/20/2016   IR RADIOLOGIST EVAL & MGMT 05/20/2016 Greggory Keen, MD [facility]  . JOINT REPLACEMENT     R great toe  . KNEE ARTHROSCOPY Right X 2  . LEFT AND RIGHT HEART CATHETERIZATION WITH CORONARY ANGIOGRAM N/A 07/15/2012   Procedure: LEFT AND RIGHT HEART CATHETERIZATION WITH CORONARY ANGIOGRAM;  Surgeon: Jettie Booze, MD;  Location: [facility] CATH LAB;   Service: Cardiovascular;  Laterality: N/A;  . LUMBAR LAMINECTOMY/DECOMPRESSION MICRODISCECTOMY  08/10/2011   Procedure: LUMBAR LAMINECTOMY/DECOMPRESSION MICRODISCECTOMY;  Surgeon: Cooper Render Pool;  Location: [place] NEURO ORS;  Service: Neurosurgery;  Laterality: Right;  RIGHT Lumbar five-sacral one LAMINECTOMY, MICRODISCECTOMY  . LUMBAR WOUND DEBRIDEMENT  08/22/2011   Procedure: LUMBAR WOUND DEBRIDEMENT;  Surgeon: Eustace Moore;  Location: [place] NEURO ORS;  Service: Neurosurgery;  Laterality: N/A;  Repair of CSF Leak requiring laminectomy  . PERCUTANEOUS CORONARY STENT INTERVENTION (PCI-S) N/A 07/15/2012   Procedure: PERCUTANEOUS CORONARY STENT INTERVENTION (PCI-S);  Surgeon: Jettie Booze, MD;  Location: [facility] CATH LAB;  Service: Cardiovascular;  Laterality: N/A;  . RHINOPLASTY  [id number]  . TEE WITHOUT CARDIOVERSION N/A 04/28/2016   Procedure: TRANSESOPHAGEAL ECHOCARDIOGRAM (TEE);  Surgeon: Skeet Latch, MD;  Location: [facility]  ENDOSCOPY;  Service: Cardiovascular;  Laterality: N/A;  . TONSILLECTOMY  1940  . TRANSURETHRAL RESECTION OF PROSTATE  x2     Current Outpatient Prescriptions  Medication Sig Dispense Refill  . acetaminophen (TYLENOL) 500 MG tablet Take 1,000 mg by mouth every 6 (six) hours as needed for fever.    [name] albuterol (PROVENTIL HFA;VENTOLIN HFA) 108 (90 Base) MCG/ACT inhaler Inhale 2 puffs into the lungs every 6 (six) hours as needed for wheezing or shortness of breath. 1 Inhaler 0  . Amino Acids-Protein Hydrolys (FEEDING SUPPLEMENT, PRO-STAT SUGAR FREE 64,) LIQD Take 30 mLs by mouth 2 (two) times daily. 900 mL 0  . aspirin EC 81 MG EC tablet Take 1 tablet (81 mg total) by mouth daily.    . budesonide-formoterol (SYMBICORT) 80-4.5 MCG/ACT inhaler Inhale 2 puffs into the lungs 2 (two) times daily.    . Ca Phosphate-Cholecalciferol (CALTRATE GUMMY BITES) 250-400 MG-UNIT CHEW Chew 2 tablets by mouth 2 (two) times daily.    . carbamazepine (TEGRETOL) 100 MG chewable tablet Chew 300 mg by  mouth 2 (two) times daily.    [name] docusate sodium (COLACE) 100 MG capsule Take 100 mg by mouth daily.     . feeding supplement, ENSURE ENLIVE, (ENSURE ENLIVE) LIQD Take 237 mLs by mouth 2 (two) times daily between meals. 237 mL 12  . finasteride (PROSCAR) 5 MG tablet Take 5 mg by mouth every morning.     [name] ipratropium (ATROVENT HFA) 17 MCG/ACT inhaler Inhale 2 puffs into the lungs 2 (two) times daily as needed for wheezing.     [name] ipratropium (ATROVENT) 0.02 % nebulizer solution Take 2.5 mLs (0.5 mg total) by nebulization 2 (two) times daily as needed for wheezing or shortness of breath. 75 mL 12  . ipratropium-albuterol (DUONEB) 0.5-2.5 (3) MG/3ML SOLN Take 3 mLs by nebulization every 6 (six) hours as needed. (Patient taking differently: Take 3 mLs by nebulization every 6 (six) hours as needed (for SOB). ) 360 mL 2  . isosorbide mononitrate (IMDUR) 30 MG 24 hr tablet Take 1 tablet (30 mg total) by mouth daily. 30 tablet 0  . Melatonin 3 MG TABS Take 3 mg by mouth at bedtime.    . metoprolol succinate (TOPROL-XL) 12.5 mg TB24 24 hr tablet Take 12.5 mg by mouth daily.    . nitroGLYCERIN (NITROSTAT) 0.4 MG SL tablet Place 1 tablet (0.4 mg total) under the tongue every 5 (five) minutes as needed for chest pain (As needed for chest pain). 25 tablet 3  . OXYGEN Inhale 2 L/min into the lungs at bedtime.    . potassium chloride SA (K-DUR,KLOR-CON) 20 MEQ tablet Take 20 mEq by mouth daily.     . ranitidine (ZANTAC) 150 MG tablet Take 1 tablet (150 mg total) by mouth 2 (two) times daily. 180 tablet 3  . senna (SENOKOT) 8.6 MG tablet Take 1 tablet by mouth daily.     . sertraline (ZOLOFT) 100 MG tablet Take 150 mg by mouth every morning.     . tamsulosin (FLOMAX) 0.4 MG CAPS capsule Take 1 capsule (0.4 mg total) by mouth daily. 30 capsule 0  . traZODone (DESYREL) 50 MG tablet TAKE ONE TABLET BY MOUTH AT BEDTIME 30 tablet 3   No current facility-administered medications for this visit.     Allergies:    Patient has no known allergies.   Social History:  The patient  reports that he has never smoked. He has never used smokeless tobacco. He reports that he  does not drink alcohol or use drugs.   Family History:  The patient's family history includes Addison's disease in his father; Heart attack in his mother and sister; Heart disease in his brother, brother, and sister; Lung cancer in his brother; Prostate cancer in his brother.    ROS:  Please see the history of present illness.   Otherwise, review of systems is positive for Fatigue, leg pain, cough, dyspnea on exertion, wheezing, constipation, balance problems, muscle pain, rash, easy bruising, headaches.   All other systems are reviewed and negative.    PHYSICAL EXAM: VS:  BP 110/66   Pulse 85   Ht 5\' 7"  (1.702 m)   Wt 165 lb 3.2 oz (74.9 kg)   BMI 25.87 kg/m  , BMI Body mass index is 25.87 kg/m. GEN: Well nourished, well developed, in no acute distress  HEENT: normal  Neck: no JVD, carotid bruits, or masses Cardiac: RRR; no murmurs, rubs, or gallops,no edema  Respiratory:  clear to auscultation bilaterally, normal work of breathing GI: soft, nontender, nondistended, + BS MS: no deformity or atrophy  Skin: warm and dry,  device pocket is well healed Neuro:  Strength and sensation are intact Psych: euthymic mood, full affect  EKG:  EKG is not ordered today. Personal review of the ECG ordered 09/24/16 shows A paced, LBBB   Device interrogation is reviewed today in detail.  See PaceArt for details.   Recent Labs: 01/01/2016: B Natriuretic Peptide 122.1 07/19/2016: ALT 16; BUN 26; Creatinine, Ser 0.81; Potassium 4.4; Sodium 138 09/17/2016: Hemoglobin 10.3; Platelets 289.0    Lipid Panel     Component Value Date/Time   CHOL 179 04/11/2015 0227   TRIG 38 04/11/2015 0227   HDL 68 04/11/2015 0227   CHOLHDL 2.6 04/11/2015 0227   VLDL 8 04/11/2015 0227   LDLCALC 103 (H) 04/11/2015 0227     Wt Readings from Last 3 Encounters:    10/06/16 165 lb 3.2 oz (74.9 kg)  09/24/16 164 lb (74.4 kg)  09/21/16 165 lb (74.8 kg)      Other studies Reviewed: Additional studies/ records that were reviewed today include: TTE 08/2014  Review of the above records today demonstrates:  - Left ventricle: The cavity size was normal. Wall thickness was increased in a pattern of mild LVH. There was moderate focal basal hypertrophy of the septum. Systolic function was normal. The estimated ejection fraction was in the range of 55% to 60%. Incoordinate septal motion. Doppler parameters are consistent with abnormal left ventricular relaxation (grade 1 diastolic dysfunction). The E/e&' ratio is between 8-15, suggesting indeterminate LV filling pressure. - Aortic valve: Sclerosis without stenosis. There was no regurgitation. - Mitral valve: Mildly thickened leaflets . There was trivial regurgitation. - Left atrium: Moderately dilated at 41 ml/m2. - Pulmonary arteries: PA peak pressure: 33 mm Hg (S).   ASSESSMENT AND PLAN:  1.  Tachy-brady syndrome: s/p dual chamber pacemaker placement.  His maker functioning appropriately. No changes made today.  2. Ischemic heart disease: s/p CABG and multiple PCI.  No chest pain today. We'll continue current medications.  3. Hyperlipidemia: Continue diet control  Current medicines are reviewed at length with the patient today.   The patient does not have concerns regarding his medicines.  The following changes were made today:  none  Labs/ tests ordered today include:  No orders of the defined types were placed in this encounter.    Disposition:   FU with [name] 12 months  Signed, [name]  Shane Maltos, MD  10/06/2016 10:19 AM     [facility] [address] [place]  [place] [address] [phone] (office) [phone] (fax)

## 2016-10-06 NOTE — Patient Instructions (Addendum)
Medication Instructions:    Your physician recommends that you continue on your current medications as directed. Please refer to the Current Medication list given to you today.  --- If you need a refill on your cardiac medications before your next appointment, please call your pharmacy. ---  Labwork:  None ordered  Testing/Procedures:  None ordered  Follow-Up: Remote monitoring is used to monitor your Pacemaker of ICD from home. This monitoring reduces the number of office visits required to check your device to one time per year. It allows [place] to keep an eye on the functioning of your device to ensure it is working properly. You are scheduled for a device check from home on 01/05/2017. You may send your transmission at any time that day. If you have a wireless device, the transmission will be sent automatically. After your physician reviews your transmission, you will receive a postcard with your next transmission date.   Your physician wants you to follow-up in: 1 year with Dr. Curt Bears.  You will receive a reminder letter in the mail two months in advance. If you don't receive a letter, please call our office to schedule the follow-up appointment.  Thank you for choosing [facility]!!   Trinidad Curet, RN [phone]

## 2016-10-07 ENCOUNTER — Encounter: Payer: Self-pay | Admitting: Family Medicine

## 2016-10-11 ENCOUNTER — Encounter: Payer: Self-pay | Admitting: Gastroenterology

## 2016-10-13 ENCOUNTER — Telehealth: Payer: Self-pay

## 2016-10-13 NOTE — Telephone Encounter (Signed)
Patient calling in regarding this. Best # [phone]

## 2016-10-13 NOTE — Telephone Encounter (Signed)
Call was placed to presurgical testing and I spoke with Santiago Glad at [phone].  She will discuss with anesth and call back with a response.  The pt needs to have a presurgical test to determine if the pt should be scheduled for EGD.

## 2016-10-13 NOTE — Telephone Encounter (Signed)
Dr Ardis Hughs the pre surgical testing center called and stated that they can not pre screen without the actual procedure being scheduled.  Do you want me to set that up?

## 2016-10-14 ENCOUNTER — Other Ambulatory Visit: Payer: Self-pay

## 2016-10-14 DIAGNOSIS — D490 Neoplasm of unspecified behavior of digestive system: Secondary | ICD-10-CM

## 2016-10-14 NOTE — Telephone Encounter (Signed)
Pt has been scheduled for EGD at [facility] on 10/28/16.  [facility] aware that he needs previsit testing appt prior to the appt.  The pt has been notified via My Chart.  He was instructed to call or contact me with any questions

## 2016-10-14 NOTE — Telephone Encounter (Signed)
OK, lets schedule him for EGD at [facility] with MAC sedation March 1st.  Thanks

## 2016-10-15 NOTE — Patient Instructions (Addendum)
                [name]  10/15/2016   Your procedure is scheduled on: 10-28-16 arrive at 800 AM  Report to [facility]  Entrance to admitting  Call this number if you have problems the morning of surgery [phone]    Do not eat food or drink liquids :After Midnight.     Take these medicines the morning of surgery with A SIP OF WATER: ALBUTERIL INHALER IF NEEDED AND BRING INHALER WITH YOU, SYMBICCORT, CARBAMEZEPINE (TEGRETOL), FINASTERIDE (PROSCAR), ATROVENT INHALER, ISOSORBIDE MONONITRATE (IMDUR), METOPROLOL SUCCINATE (TOPROLOL XL)  SERTRALINE (ZOLOFT)              You may not have any metal on your body including hair pins and              piercings  Do not wear jewelry, make-up, lotions, powders or perfumes, deodorant             Do not wear nail polish.  Do not shave  48 hours prior to surgery.              Men may shave face and neck.   Do not bring valuables to the hospital. [name].  Contacts, dentures or bridgework may not be worn into surgery.  Leave suitcase in the car. After surgery it may be brought to your room.     Patients discharged the day of surgery will not be allowed to drive home.  Name and phone number of your driver: wife kitty cell [phone]  Special Instructions: N/A              Please read over the following fact sheets you were given: _____________________________________________________________________

## 2016-10-15 NOTE — Progress Notes (Signed)
ekg 09-24-16 epic Last pacer check note 10-06-16 epic Chest xray 09-17-16 epic Echo 06-08-16 epic

## 2016-10-19 ENCOUNTER — Encounter (HOSPITAL_COMMUNITY): Payer: Self-pay

## 2016-10-19 ENCOUNTER — Encounter (HOSPITAL_COMMUNITY)
Admission: RE | Admit: 2016-10-19 | Discharge: 2016-10-19 | Disposition: A | Discharge status: Home / Self Care | Payer: Medicare Other | Source: Ambulatory Visit | Attending: Gastroenterology | Admitting: Gastroenterology

## 2016-10-19 DIAGNOSIS — Z01812 Encounter for preprocedural laboratory examination: Secondary | ICD-10-CM | POA: Diagnosis present

## 2016-10-19 LAB — CBC
HEMATOCRIT: 32.6 % — AB (ref 39.0–52.0)
HEMOGLOBIN: 10.4 g/dL — AB (ref 13.0–17.0)
MCH: 28.9 pg (ref 26.0–34.0)
MCHC: 31.9 g/dL (ref 30.0–36.0)
MCV: 90.6 fL (ref 78.0–100.0)
Platelets: 250 10*3/uL (ref 150–400)
RBC: 3.6 MIL/uL — ABNORMAL LOW (ref 4.22–5.81)
RDW: 15.6 % — ABNORMAL HIGH (ref 11.5–15.5)
WBC: 8.1 10*3/uL (ref 4.0–10.5)

## 2016-10-19 LAB — COMPREHENSIVE METABOLIC PANEL
ALBUMIN: 3.7 g/dL (ref 3.5–5.0)
ALK PHOS: 134 U/L — AB (ref 38–126)
ALT: 24 U/L (ref 17–63)
ANION GAP: 4 — AB (ref 5–15)
AST: 39 U/L (ref 15–41)
BILIRUBIN TOTAL: 0.6 mg/dL (ref 0.3–1.2)
BUN: 28 mg/dL — ABNORMAL HIGH (ref 6–20)
CALCIUM: 10.2 mg/dL (ref 8.9–10.3)
CO2: 29 mmol/L (ref 22–32)
Chloride: 106 mmol/L (ref 101–111)
Creatinine, Ser: 1.04 mg/dL (ref 0.61–1.24)
GFR calc Af Amer: >60 mL/min (ref >60)
GFR calc non Af Amer: >60 mL/min (ref >60)
GLUCOSE: 116 mg/dL — AB (ref 65–99)
Potassium: 4.8 mmol/L (ref 3.5–5.1)
Sodium: 139 mmol/L (ref 135–145)
TOTAL PROTEIN: 7.5 g/dL (ref 6.5–8.1)

## 2016-10-19 NOTE — Progress Notes (Signed)
cmet results routed to dr Allene Dillon

## 2016-10-28 ENCOUNTER — Encounter (HOSPITAL_COMMUNITY)
Admission: RE | Disposition: A | Discharge status: Home / Self Care | Payer: Self-pay | Source: Ambulatory Visit | Attending: Gastroenterology

## 2016-10-28 ENCOUNTER — Encounter (HOSPITAL_COMMUNITY): Payer: Self-pay

## 2016-10-28 ENCOUNTER — Ambulatory Visit (HOSPITAL_COMMUNITY): Payer: Medicare Other | Admitting: Anesthesiology

## 2016-10-28 ENCOUNTER — Ambulatory Visit (HOSPITAL_COMMUNITY)
Admission: RE | Admit: 2016-10-28 | Discharge: 2016-10-28 | Disposition: A | Discharge status: Home / Self Care | Payer: Medicare Other | Source: Ambulatory Visit | Attending: Gastroenterology | Admitting: Gastroenterology

## 2016-10-28 ENCOUNTER — Encounter: Payer: Self-pay | Admitting: Gastroenterology

## 2016-10-28 DIAGNOSIS — I4892 Unspecified atrial flutter: Secondary | ICD-10-CM | POA: Diagnosis not present

## 2016-10-28 DIAGNOSIS — Z955 Presence of coronary angioplasty implant and graft: Secondary | ICD-10-CM | POA: Insufficient documentation

## 2016-10-28 DIAGNOSIS — E785 Hyperlipidemia, unspecified: Secondary | ICD-10-CM | POA: Diagnosis not present

## 2016-10-28 DIAGNOSIS — Z950 Presence of cardiac pacemaker: Secondary | ICD-10-CM | POA: Insufficient documentation

## 2016-10-28 DIAGNOSIS — G4700 Insomnia, unspecified: Secondary | ICD-10-CM | POA: Insufficient documentation

## 2016-10-28 DIAGNOSIS — K3189 Other diseases of stomach and duodenum: Secondary | ICD-10-CM

## 2016-10-28 DIAGNOSIS — Z923 Personal history of irradiation: Secondary | ICD-10-CM | POA: Insufficient documentation

## 2016-10-28 DIAGNOSIS — K2970 Gastritis, unspecified, without bleeding: Secondary | ICD-10-CM | POA: Diagnosis not present

## 2016-10-28 DIAGNOSIS — K449 Diaphragmatic hernia without obstruction or gangrene: Secondary | ICD-10-CM | POA: Diagnosis not present

## 2016-10-28 DIAGNOSIS — K219 Gastro-esophageal reflux disease without esophagitis: Secondary | ICD-10-CM | POA: Insufficient documentation

## 2016-10-28 DIAGNOSIS — Z87442 Personal history of urinary calculi: Secondary | ICD-10-CM | POA: Insufficient documentation

## 2016-10-28 DIAGNOSIS — Z9689 Presence of other specified functional implants: Secondary | ICD-10-CM | POA: Insufficient documentation

## 2016-10-28 DIAGNOSIS — Z951 Presence of aortocoronary bypass graft: Secondary | ICD-10-CM | POA: Insufficient documentation

## 2016-10-28 DIAGNOSIS — Z8546 Personal history of malignant neoplasm of prostate: Secondary | ICD-10-CM | POA: Insufficient documentation

## 2016-10-28 DIAGNOSIS — I739 Peripheral vascular disease, unspecified: Secondary | ICD-10-CM | POA: Insufficient documentation

## 2016-10-28 DIAGNOSIS — J45909 Unspecified asthma, uncomplicated: Secondary | ICD-10-CM | POA: Diagnosis not present

## 2016-10-28 DIAGNOSIS — F329 Major depressive disorder, single episode, unspecified: Secondary | ICD-10-CM | POA: Insufficient documentation

## 2016-10-28 DIAGNOSIS — Z7982 Long term (current) use of aspirin: Secondary | ICD-10-CM | POA: Insufficient documentation

## 2016-10-28 DIAGNOSIS — I472 Ventricular tachycardia: Secondary | ICD-10-CM | POA: Diagnosis not present

## 2016-10-28 DIAGNOSIS — D490 Neoplasm of unspecified behavior of digestive system: Secondary | ICD-10-CM | POA: Diagnosis present

## 2016-10-28 DIAGNOSIS — Z9889 Other specified postprocedural states: Secondary | ICD-10-CM | POA: Insufficient documentation

## 2016-10-28 DIAGNOSIS — I119 Hypertensive heart disease without heart failure: Secondary | ICD-10-CM | POA: Diagnosis not present

## 2016-10-28 DIAGNOSIS — I495 Sick sinus syndrome: Secondary | ICD-10-CM | POA: Diagnosis not present

## 2016-10-28 DIAGNOSIS — Z8042 Family history of malignant neoplasm of prostate: Secondary | ICD-10-CM | POA: Insufficient documentation

## 2016-10-28 DIAGNOSIS — C170 Malignant neoplasm of duodenum: Secondary | ICD-10-CM | POA: Insufficient documentation

## 2016-10-28 DIAGNOSIS — I447 Left bundle-branch block, unspecified: Secondary | ICD-10-CM | POA: Diagnosis not present

## 2016-10-28 DIAGNOSIS — Z801 Family history of malignant neoplasm of trachea, bronchus and lung: Secondary | ICD-10-CM | POA: Insufficient documentation

## 2016-10-28 DIAGNOSIS — G629 Polyneuropathy, unspecified: Secondary | ICD-10-CM | POA: Diagnosis not present

## 2016-10-28 DIAGNOSIS — I252 Old myocardial infarction: Secondary | ICD-10-CM | POA: Insufficient documentation

## 2016-10-28 DIAGNOSIS — Z9049 Acquired absence of other specified parts of digestive tract: Secondary | ICD-10-CM | POA: Insufficient documentation

## 2016-10-28 DIAGNOSIS — Z9981 Dependence on supplemental oxygen: Secondary | ICD-10-CM | POA: Insufficient documentation

## 2016-10-28 DIAGNOSIS — I2510 Atherosclerotic heart disease of native coronary artery without angina pectoris: Secondary | ICD-10-CM | POA: Insufficient documentation

## 2016-10-28 DIAGNOSIS — Z8349 Family history of other endocrine, nutritional and metabolic diseases: Secondary | ICD-10-CM | POA: Insufficient documentation

## 2016-10-28 DIAGNOSIS — Z8249 Family history of ischemic heart disease and other diseases of the circulatory system: Secondary | ICD-10-CM | POA: Insufficient documentation

## 2016-10-28 DIAGNOSIS — Z79899 Other long term (current) drug therapy: Secondary | ICD-10-CM | POA: Insufficient documentation

## 2016-10-28 HISTORY — PX: ESOPHAGOGASTRODUODENOSCOPY (EGD) WITH PROPOFOL: SHX5813

## 2016-10-28 SURGERY — ESOPHAGOGASTRODUODENOSCOPY (EGD) WITH PROPOFOL
Anesthesia: Monitor Anesthesia Care

## 2016-10-28 MED ORDER — LACTATED RINGERS IV SOLN
INTRAVENOUS | Status: DC
  Administered 2016-10-28: 1000 mL via INTRAVENOUS

## 2016-10-28 MED ORDER — PROPOFOL 10 MG/ML IV BOLUS
INTRAVENOUS | Status: DC | PRN
  Administered 2016-10-28: 10 mg via INTRAVENOUS
  Administered 2016-10-28: 20 mg via INTRAVENOUS

## 2016-10-28 MED ORDER — PROPOFOL 500 MG/50ML IV EMUL
INTRAVENOUS | Status: DC | PRN
  Administered 2016-10-28: 100 ug/kg/min via INTRAVENOUS

## 2016-10-28 MED ORDER — LIDOCAINE 2% (20 MG/ML) 5 ML SYRINGE
INTRAMUSCULAR | Status: DC | PRN
  Administered 2016-10-28: 60 mg via INTRAVENOUS

## 2016-10-28 MED ORDER — PROPOFOL 10 MG/ML IV BOLUS
INTRAVENOUS | Status: AC
  Filled 2016-10-28: qty 60

## 2016-10-28 MED ORDER — SODIUM CHLORIDE 0.9 % IV SOLN: INTRAVENOUS | Status: DC

## 2016-10-28 MED ORDER — LIDOCAINE 2% (20 MG/ML) 5 ML SYRINGE
INTRAMUSCULAR | Status: AC
  Filled 2016-10-28: qty 5

## 2016-10-28 SURGICAL SUPPLY — 15 items

## 2016-10-28 NOTE — Anesthesia Preprocedure Evaluation (Signed)
Anesthesia Evaluation  Patient identified by MRN, date of birth, ID band Patient awake    Reviewed: Allergy & Precautions, NPO status , Patient's Chart, lab work & pertinent test results  Airway Mallampati: II  TM Distance: >3 FB Neck ROM: Full    Dental no notable dental hx.    Pulmonary neg pulmonary ROS, asthma ,    Pulmonary exam normal breath sounds clear to auscultation       Cardiovascular hypertension, Pt. on medications + angina + CAD, + Past MI, + Cardiac Stents, + CABG and +CHF  negative cardio ROS Normal cardiovascular exam+ dysrhythmias Atrial Fibrillation and Ventricular Tachycardia + pacemaker  Rhythm:Regular Rate:Normal     Neuro/Psych negative neurological ROS  negative psych ROS   GI/Hepatic negative GI ROS, Neg liver ROS,   Endo/Other  negative endocrine ROS  Renal/GU negative Renal ROS  negative genitourinary   Musculoskeletal negative musculoskeletal ROS (+)   Abdominal   Peds negative pediatric ROS (+)  Hematology negative hematology ROS (+)   Anesthesia Other Findings   Reproductive/Obstetrics negative OB ROS                             Anesthesia Physical Anesthesia Plan  ASA: II  Anesthesia Plan: MAC   Post-op Pain Management:    Induction: Intravenous  Airway Management Planned: Nasal Cannula  Additional Equipment:   Intra-op Plan:   Post-operative Plan:   Informed Consent: I have reviewed the patients History and Physical, chart, labs and discussed the procedure including the risks, benefits and alternatives for the proposed anesthesia with the patient or authorized representative who has indicated his/her understanding and acceptance.   Dental advisory given  Plan Discussed with: CRNA  Anesthesia Plan Comments:         Anesthesia Quick Evaluation

## 2016-10-28 NOTE — Transfer of Care (Signed)
Immediate Anesthesia Transfer of Care Note  Patient: [name]  Procedure(s) Performed: Procedure(s): ESOPHAGOGASTRODUODENOSCOPY (EGD) WITH PROPOFOL (N/A)  Patient Location: PACU and Endoscopy Unit  Anesthesia Type:MAC  Level of Consciousness: awake, alert , oriented and patient cooperative  Airway & Oxygen Therapy: Patient Spontanous Breathing and Patient connected to nasal cannula oxygen  Post-op Assessment: Report given to RN and Post -op Vital signs reviewed and stable  Post vital signs: Reviewed and stable  Last Vitals:  Vitals:   10/28/16 0809  BP: (!) 151/70  Pulse: 82  Resp: 17  Temp: 36.8 C    Last Pain:  Vitals:   10/28/16 0809  TempSrc: Oral         Complications: No apparent anesthesia complications

## 2016-10-28 NOTE — Anesthesia Procedure Notes (Signed)
Date/Time: 10/28/2016 10:02 AM Performed by: Dione Booze Pre-anesthesia Checklist: Patient identified, Emergency Drugs available, Suction available and Patient being monitored Patient Re-evaluated:Patient Re-evaluated prior to inductionOxygen Delivery Method: Nasal cannula Placement Confirmation: positive ETCO2

## 2016-10-28 NOTE — Anesthesia Postprocedure Evaluation (Signed)
Anesthesia Post Note  Patient: [name]  Procedure(s) Performed: Procedure(s) (LRB): ESOPHAGOGASTRODUODENOSCOPY (EGD) WITH PROPOFOL (N/A)  Patient location during evaluation: Endoscopy Anesthesia Type: MAC Level of consciousness: awake and alert Pain management: pain level controlled Vital Signs Assessment: post-procedure vital signs reviewed and stable Respiratory status: spontaneous breathing, nonlabored ventilation, respiratory function stable and patient connected to nasal cannula oxygen Cardiovascular status: stable and blood pressure returned to baseline Anesthetic complications: no       Last Vitals:  Vitals:   10/28/16 1050 10/28/16 1100  BP: (!) 154/78   Pulse: 65 60  Resp: 16 13  Temp:      Last Pain:  Vitals:   10/28/16 1034  TempSrc: Oral                 [name]

## 2016-10-28 NOTE — Interval H&P Note (Signed)
History and Physical Interval Note:  10/28/2016 9:48 AM  [name]  has presented today for surgery, with the diagnosis of duodenal neoplasm  The various methods of treatment have been discussed with the patient and family. After consideration of risks, benefits and other options for treatment, the patient has consented to  Procedure(s): ESOPHAGOGASTRODUODENOSCOPY (EGD) WITH PROPOFOL (N/A) as a surgical intervention .  The patient's history has been reviewed, patient examined, no change in status, stable for surgery.  I have reviewed the patient's chart and labs.  Questions were answered to the patient's satisfaction.     Milus Banister

## 2016-10-28 NOTE — Discharge Instructions (Signed)

## 2016-10-28 NOTE — Op Note (Signed)
[facility] Patient Name: [name] Procedure Date: 10/28/2016 MRN: [id number] Attending MD: Milus Banister , MD Date of Birth: 12-Sep-1924 CSN: [id number] Age: 83 Admit Type: Outpatient Procedure:                Upper GI endoscopy Indications:              Tumor of the duodenum Providers:                Milus Banister, MD, Elmer Ramp. Tilden Dome, RN, Cletis Athens, Technician Referring MD:              Medicines:                Monitored Anesthesia Care Complications:            No immediate complications. Estimated blood loss:                            None. Estimated Blood Loss:     Estimated blood loss: none. Procedure:                Pre-Anesthesia Assessment:                           - Prior to the procedure, a History and Physical                            was performed, and patient medications and                            allergies were reviewed. The patient's tolerance of                            previous anesthesia was also reviewed. The risks                            and benefits of the procedure and the sedation                            options and risks were discussed with the patient.                            All questions were answered, and informed consent                            was obtained. Prior Anticoagulants: The patient has                            taken no previous anticoagulant or antiplatelet                            agents. ASA Grade Assessment: III - A patient with  severe systemic disease. After reviewing the risks                            and benefits, the patient was deemed in                            satisfactory condition to undergo the procedure.                           After obtaining informed consent, the endoscope was                            passed under direct vision. Throughout the                            procedure, the patient's blood pressure,  pulse, and                            oxygen saturations were monitored continuously. The                            [id number]) scope was introduced through the                            mouth, and advanced to the third part of duodenum.                            The upper GI endoscopy was accomplished without                            difficulty. The patient tolerated the procedure                            well. Scope In: Scope Out: Findings:      The esophagus was normal.      There was mild pan-gastritis with moderate amount of sticky hematin       along the walls distally. No frank ulcers or even erosions.      The previously placed (IR) bile duct stent was located in good position,       extending into the duodenum by about 1cm. There was typical biodebris       buildup on the metal stent interces. There was again noted a       circumferential neoplastic process extending from proximal to the major       papilla to about 2cm distal to the major papilla. The process is 4cm       long and is not obstructing the lumen. This is (still) not frankly       cancerous appearing but is obviously neoplastic and I performed multiple       forcep biopsies with front viewing gastroscope as well as side viewing       duodenoscope. 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 This was biopsied                            extensively.                           - Previously placed (IR) uncovered metal mesh                            biliary stent extending into the duodenum in good                            position.                           - Hematin in stomach, underlying mild gastritis. Moderate Sedation:      N/A- Per Anesthesia Care Recommendation:            - Await pathology results.                           - Patient has a contact number available for                            emergencies. The signs and symptoms of potential                            delayed complications were discussed with the                            patient. Return to normal activities tomorrow.                            Written discharge instructions were provided to the                            patient.                           - Resume previous diet. Please also start once                            daily OTC omeprazole (20mg  pill) as well as once                            daily OTC iron supplement.                           - Continue present medications. Procedure Code(s):        --- Professional ---  [id number], Esophagogastroduodenoscopy, flexible,                            transoral; with biopsy, single or multiple Diagnosis Code(s):        --- Professional ---                           K31.89, Other diseases of stomach and duodenum                           D49.0, Neoplasm of unspecified behavior of                            digestive system CPT copyright 2016 [employer]. All rights reserved. The codes documented in this report are preliminary and upon coder review may  be revised to meet current compliance requirements. Milus Banister, MD 10/28/2016 10:34:50 AM This report has been signed electronically. Number of Addenda: 0

## 2016-10-28 NOTE — H&P (View-Only) (Signed)
Electrophysiology Office Note   Date:  10/06/2016   ID:  [name], DOB 31-Dec-1924, MRN [id number]  PCP:  Bufford Lope, DO  Cardiologist:  Mare Ferrari Primary Electrophysiologist:  Constance Haw, MD    Chief Complaint  Patient presents with  . Pacemaker Check    Tachy-Brady     History of Present Illness: [name] is a 83 y.o. male who presents today for electrophysiology evaluation.   He had CABG in 1986. He had a redo CABG in 1993. He had PTCA with stent in October 2008. He had a DES in February 2010. He had a small non-Q MI in August 2010.He presented to the emergency room on 07/15/12 after falling at home. While in the emergency room he developed chest pain followed by ventricular tachycardia requiring cardioversion. He was taken emergently to the cath lab and treated with a BMS to the mid LAD. He was treated initially with amiodarone which was subsequently stopped prior to discharge secondary to side effects. His echocardiogram November 2013 showed ejection fraction 40-45% and moderate left atrial enlargement. He has chronic LBBB. In Jan 2016 he had an episode of syncope and chest pain. Myoview showed scar. He has had bradycardia documented in the past. He presented to the ED 04/10/15 with tachycardia and was found to be in A flutter with variable VR. CHADs VASc =4. He also had periods of marked bradycardia. He was felt to have tachybradycardia syndrome. He underwent placement of a MDT pacemaker [id number] without complications.    Today, he denies symptoms of palpitations, chest pain, orthopnea, PND, lower extremity edema, claudication, dizziness, presyncope, syncope, bleeding, or neurologic sequela. The patient is tolerating medications without difficulties. From a cardiac standpoint, he is doing well without chest pain, shortness of breath. He does have an intestinal obstruction that could possibly be cancerous that he is having worked up currently.   Past Medical  History:  Diagnosis Date  . Anemia 2014   was on iron pills and then was able to come off  . Arthritis    "all over"  . Asthma       . Atrial flutter ([place])    a. diagnosed 03/2015  . Bacteremia 03/2016  . Complication of anesthesia    B/P dropped low and had to stay in recovery longer  . Coronary artery disease    a. s/p CABG 1986 b. redo CABG 1993 c. PTCA 2008 d. DES 2010  . Depression       . Diverticulosis   . GERD (gastroesophageal reflux disease)       . Hepatitis    hepatitis- long time ago, occupational contamination   . Hiatal hernia   . Hyperlipemia   . Hypertension       . Insomnia       . Kidney stones   . Myocardial infarction   . On home oxygen therapy    "2L; just at night" (01/31/2015)  . Peripheral neuropathy ([place])   . Peripheral vascular disease (HCC)    thrombus- in leg- many yrs. ago- ?R- coumadin x1 mth  . Pneumonia 1960's  . Presence of permanent cardiac pacemaker   . Prostate cancer [facility])    radiation therapy- 2002  . Systolic CHF ([place])       . VT (ventricular tachycardia) ([place]) 06/2012   a. in the setting of STEMI b. on amio during hospitalization   Past Surgical History:  Procedure Laterality Date  . APPENDECTOMY  1943  .  BILIARY DRAINAGE  05/14/2016  . CATARACT EXTRACTION W/ INTRAOCULAR LENS  IMPLANT, BILATERAL    . COLONOSCOPY    . CORONARY ARTERY BYPASS GRAFT  1986;  1993   CABG X 5; CABG X 1  . CYSTOSCOPY W/ URETERAL STENT PLACEMENT Right 02/28/2013   Procedure: CYSTOSCOPY WITH RETROGRADE PYELOGRAM/URETERAL STENT PLACEMENT;  Surgeon: Dutch Gray, MD;  Location: [facility];  Service: Urology;  Laterality: Right;  . CYSTOSCOPY WITH URETEROSCOPY AND STENT PLACEMENT Right 03/23/2013   Procedure: RIGHT URETEROSCOPY, LASER LITHO AND STENT PLACEMENT;  Surgeon: Fredricka Bonine, MD;  Location: [facility];  Service: Urology;  Laterality: Right;  . ENDOSCOPIC RETROGRADE CHOLANGIOPANCREATOGRAPHY (ERCP) WITH PROPOFOL  04/30/2016  . EP IMPLANTABLE  DEVICE N/A 04/11/2015   Procedure: Pacemaker Implant;  Surgeon: Kmya Placide Meredith Leeds, MD;  Location: [place] CV LAB;  Service: Cardiovascular;  Laterality: N/A;  . ERCP N/A 04/30/2016   Procedure: ENDOSCOPIC RETROGRADE CHOLANGIOPANCREATOGRAPHY (ERCP);  Surgeon: Milus Banister, MD;  Location: [place];  Service: Endoscopy;  Laterality: N/A;  . FOOT FUSION Left 2000   heel  . FRACTURE SURGERY     heel- crushed -2002,  (hardware)   . HAMMER TOE SURGERY Right    little toe  . HARDWARE REMOVAL Right 2002   great toe; Dr. Sharol Given  . HOLMIUM LASER APPLICATION Right [id number]   Procedure: HOLMIUM LASER APPLICATION;  Surgeon: Fredricka Bonine, MD;  [facility];  Service: Urology;  Laterality: Right;  . INGUINAL HERNIA REPAIR Left 01/31/2015   Procedure: OPEN REPAIR RECURRENT LEFT INGUINAL HERNIA;  Surgeon: Fanny Skates, MD;  Location: [place];  Service: General;  Laterality: Left;  . INSERTION OF MESH Left 01/31/2015   Procedure: INSERTION OF MESH;  Surgeon: Fanny Skates, MD;  Location: [place];  Service: General;  Laterality: Left;  . INTRAMEDULLARY (IM) NAIL INTERTROCHANTERIC Right 08/29/2013   Procedure: INTRAMEDULLARY (IM) NAIL INTERTROCHANTRIC;  Surgeon: Marianna Payment, MD;  Location: [place];  Service: Orthopedics;  Laterality: Right;  . IR GENERIC HISTORICAL  05/01/2016   IR INT EXT BILIARY DRAIN WITH CHOLANGIOGRAM 05/01/2016 Corrie Mckusick, DO [facility]  . IR GENERIC HISTORICAL  06/01/2016   IR BILIARY STENT(S) EXISTING ACCESS INC DILATION CATH EXCHANGE 06/01/2016 Corrie Mckusick, DO [facility]  . IR GENERIC HISTORICAL  05/20/2016   IR RADIOLOGIST EVAL & MGMT 05/20/2016 Greggory Keen, MD [facility] INTER[facility]  . JOINT REPLACEMENT     R great toe  . KNEE ARTHROSCOPY Right X 2  . LEFT AND RIGHT HEART CATHETERIZATION WITH CORONARY ANGIOGRAM N/A 07/15/2012   Procedure: LEFT AND RIGHT HEART CATHETERIZATION WITH CORONARY ANGIOGRAM;  Surgeon: Jettie Booze, MD;  Location: [facility] CATH LAB;   Service: Cardiovascular;  Laterality: N/A;  . LUMBAR LAMINECTOMY/DECOMPRESSION MICRODISCECTOMY  08/10/2011   Procedure: LUMBAR LAMINECTOMY/DECOMPRESSION MICRODISCECTOMY;  Surgeon: Cooper Render Pool;  Location: [place] NEURO ORS;  Service: Neurosurgery;  Laterality: Right;  RIGHT Lumbar five-sacral one LAMINECTOMY, MICRODISCECTOMY  . LUMBAR WOUND DEBRIDEMENT  08/22/2011   Procedure: LUMBAR WOUND DEBRIDEMENT;  Surgeon: Eustace Moore;  Location: [place] NEURO ORS;  Service: Neurosurgery;  Laterality: N/A;  Repair of CSF Leak requiring laminectomy  . PERCUTANEOUS CORONARY STENT INTERVENTION (PCI-S) N/A 07/15/2012   Procedure: PERCUTANEOUS CORONARY STENT INTERVENTION (PCI-S);  Surgeon: Jettie Booze, MD;  Location: [facility] CATH LAB;  Service: Cardiovascular;  Laterality: N/A;  . RHINOPLASTY  [id number]  . TEE WITHOUT CARDIOVERSION N/A 04/28/2016   Procedure: TRANSESOPHAGEAL ECHOCARDIOGRAM (TEE);  Surgeon: Skeet Latch, MD;  Location: [facility]  ENDOSCOPY;  Service: Cardiovascular;  Laterality: N/A;  . TONSILLECTOMY  1940  . TRANSURETHRAL RESECTION OF PROSTATE  x2     Current Outpatient Prescriptions  Medication Sig Dispense Refill  . acetaminophen (TYLENOL) 500 MG tablet Take 1,000 mg by mouth every 6 (six) hours as needed for fever.    [name] albuterol (PROVENTIL HFA;VENTOLIN HFA) 108 (90 Base) MCG/ACT inhaler Inhale 2 puffs into the lungs every 6 (six) hours as needed for wheezing or shortness of breath. 1 Inhaler 0  . Amino Acids-Protein Hydrolys (FEEDING SUPPLEMENT, PRO-STAT SUGAR FREE 64,) LIQD Take 30 mLs by mouth 2 (two) times daily. 900 mL 0  . aspirin EC 81 MG EC tablet Take 1 tablet (81 mg total) by mouth daily.    . budesonide-formoterol (SYMBICORT) 80-4.5 MCG/ACT inhaler Inhale 2 puffs into the lungs 2 (two) times daily.    . Ca Phosphate-Cholecalciferol (CALTRATE GUMMY BITES) 250-400 MG-UNIT CHEW Chew 2 tablets by mouth 2 (two) times daily.    . carbamazepine (TEGRETOL) 100 MG chewable tablet Chew 300 mg by  mouth 2 (two) times daily.    [name] docusate sodium (COLACE) 100 MG capsule Take 100 mg by mouth daily.     . feeding supplement, ENSURE ENLIVE, (ENSURE ENLIVE) LIQD Take 237 mLs by mouth 2 (two) times daily between meals. 237 mL 12  . finasteride (PROSCAR) 5 MG tablet Take 5 mg by mouth every morning.     [name] ipratropium (ATROVENT HFA) 17 MCG/ACT inhaler Inhale 2 puffs into the lungs 2 (two) times daily as needed for wheezing.     [name] ipratropium (ATROVENT) 0.02 % nebulizer solution Take 2.5 mLs (0.5 mg total) by nebulization 2 (two) times daily as needed for wheezing or shortness of breath. 75 mL 12  . ipratropium-albuterol (DUONEB) 0.5-2.5 (3) MG/3ML SOLN Take 3 mLs by nebulization every 6 (six) hours as needed. (Patient taking differently: Take 3 mLs by nebulization every 6 (six) hours as needed (for SOB). ) 360 mL 2  . isosorbide mononitrate (IMDUR) 30 MG 24 hr tablet Take 1 tablet (30 mg total) by mouth daily. 30 tablet 0  . Melatonin 3 MG TABS Take 3 mg by mouth at bedtime.    . metoprolol succinate (TOPROL-XL) 12.5 mg TB24 24 hr tablet Take 12.5 mg by mouth daily.    . nitroGLYCERIN (NITROSTAT) 0.4 MG SL tablet Place 1 tablet (0.4 mg total) under the tongue every 5 (five) minutes as needed for chest pain (As needed for chest pain). 25 tablet 3  . OXYGEN Inhale 2 L/min into the lungs at bedtime.    . potassium chloride SA (K-DUR,KLOR-CON) 20 MEQ tablet Take 20 mEq by mouth daily.     . ranitidine (ZANTAC) 150 MG tablet Take 1 tablet (150 mg total) by mouth 2 (two) times daily. 180 tablet 3  . senna (SENOKOT) 8.6 MG tablet Take 1 tablet by mouth daily.     . sertraline (ZOLOFT) 100 MG tablet Take 150 mg by mouth every morning.     . tamsulosin (FLOMAX) 0.4 MG CAPS capsule Take 1 capsule (0.4 mg total) by mouth daily. 30 capsule 0  . traZODone (DESYREL) 50 MG tablet TAKE ONE TABLET BY MOUTH AT BEDTIME 30 tablet 3   No current facility-administered medications for this visit.     Allergies:    Patient has no known allergies.   Social History:  The patient  reports that he has never smoked. He has never used smokeless tobacco. He reports that he  does not drink alcohol or use drugs.   Family History:  The patient's family history includes Addison's disease in his father; Heart attack in his mother and sister; Heart disease in his brother, brother, and sister; Lung cancer in his brother; Prostate cancer in his brother.    ROS:  Please see the history of present illness.   Otherwise, review of systems is positive for Fatigue, leg pain, cough, dyspnea on exertion, wheezing, constipation, balance problems, muscle pain, rash, easy bruising, headaches.   All other systems are reviewed and negative.    PHYSICAL EXAM: VS:  BP 110/66   Pulse 85   Ht 5\' 7"  (1.702 m)   Wt 165 lb 3.2 oz (74.9 kg)   BMI 25.87 kg/m  , BMI Body mass index is 25.87 kg/m. GEN: Well nourished, well developed, in no acute distress  HEENT: normal  Neck: no JVD, carotid bruits, or masses Cardiac: RRR; no murmurs, rubs, or gallops,no edema  Respiratory:  clear to auscultation bilaterally, normal work of breathing GI: soft, nontender, nondistended, + BS MS: no deformity or atrophy  Skin: warm and dry,  device pocket is well healed Neuro:  Strength and sensation are intact Psych: euthymic mood, full affect  EKG:  EKG is not ordered today. Personal review of the ECG ordered 09/24/16 shows A paced, LBBB   Device interrogation is reviewed today in detail.  See PaceArt for details.   Recent Labs: 01/01/2016: B Natriuretic Peptide 122.1 07/19/2016: ALT 16; BUN 26; Creatinine, Ser 0.81; Potassium 4.4; Sodium 138 09/17/2016: Hemoglobin 10.3; Platelets 289.0    Lipid Panel     Component Value Date/Time   CHOL 179 04/11/2015 0227   TRIG 38 04/11/2015 0227   HDL 68 04/11/2015 0227   CHOLHDL 2.6 04/11/2015 0227   VLDL 8 04/11/2015 0227   LDLCALC 103 (H) 04/11/2015 0227     Wt Readings from Last 3 Encounters:    10/06/16 165 lb 3.2 oz (74.9 kg)  09/24/16 164 lb (74.4 kg)  09/21/16 165 lb (74.8 kg)      Other studies Reviewed: Additional studies/ records that were reviewed today include: TTE 08/2014  Review of the above records today demonstrates:  - Left ventricle: The cavity size was normal. Wall thickness was increased in a pattern of mild LVH. There was moderate focal basal hypertrophy of the septum. Systolic function was normal. The estimated ejection fraction was in the range of 55% to 60%. Incoordinate septal motion. Doppler parameters are consistent with abnormal left ventricular relaxation (grade 1 diastolic dysfunction). The E/e&' ratio is between 8-15, suggesting indeterminate LV filling pressure. - Aortic valve: Sclerosis without stenosis. There was no regurgitation. - Mitral valve: Mildly thickened leaflets . There was trivial regurgitation. - Left atrium: Moderately dilated at 41 ml/m2. - Pulmonary arteries: PA peak pressure: 33 mm Hg (S).   ASSESSMENT AND PLAN:  1.  Tachy-brady syndrome: s/p dual chamber pacemaker placement.  His maker functioning appropriately. No changes made today.  2. Ischemic heart disease: s/p CABG and multiple PCI.  No chest pain today. We'll continue current medications.  3. Hyperlipidemia: Continue diet control  Current medicines are reviewed at length with the patient today.   The patient does not have concerns regarding his medicines.  The following changes were made today:  none  Labs/ tests ordered today include:  No orders of the defined types were placed in this encounter.    Disposition:   FU with Eather Chaires 12 months  Signed, Piper [place] [employer]  Raschelle Wisenbaker, MD  10/06/2016 10:19 AM     [facility] [address] [place] [address] [phone] (office) [phone] (fax)

## 2016-11-01 ENCOUNTER — Encounter (HOSPITAL_COMMUNITY): Payer: Self-pay | Admitting: Gastroenterology

## 2016-11-01 ENCOUNTER — Other Ambulatory Visit: Payer: Self-pay

## 2016-11-01 DIAGNOSIS — C801 Malignant (primary) neoplasm, unspecified: Secondary | ICD-10-CM

## 2016-11-02 ENCOUNTER — Encounter: Payer: Self-pay | Admitting: Family Medicine

## 2016-11-04 ENCOUNTER — Encounter: Payer: Self-pay | Admitting: Gastroenterology

## 2016-11-04 ENCOUNTER — Telehealth: Payer: Self-pay | Admitting: Oncology

## 2016-11-04 NOTE — Telephone Encounter (Signed)
Patient called to schedule a follow up appointment.

## 2016-11-15 ENCOUNTER — Telehealth: Payer: Self-pay | Admitting: *Deleted

## 2016-11-15 ENCOUNTER — Telehealth: Payer: Self-pay | Admitting: Oncology

## 2016-11-15 ENCOUNTER — Other Ambulatory Visit: Payer: Self-pay | Admitting: *Deleted

## 2016-11-15 ENCOUNTER — Encounter: Payer: Self-pay | Admitting: *Deleted

## 2016-11-15 ENCOUNTER — Ambulatory Visit (HOSPITAL_BASED_OUTPATIENT_CLINIC_OR_DEPARTMENT_OTHER): Payer: Medicare Other

## 2016-11-15 ENCOUNTER — Ambulatory Visit (HOSPITAL_BASED_OUTPATIENT_CLINIC_OR_DEPARTMENT_OTHER): Payer: Medicare Other | Admitting: Oncology

## 2016-11-15 VITALS — BP 131/66 | HR 87 | Temp 98.3°F | Resp 18 | Ht 67.0 in | Wt 163.9 lb

## 2016-11-15 DIAGNOSIS — D6489 Other specified anemias: Secondary | ICD-10-CM

## 2016-11-15 DIAGNOSIS — C170 Malignant neoplasm of duodenum: Secondary | ICD-10-CM

## 2016-11-15 DIAGNOSIS — I2510 Atherosclerotic heart disease of native coronary artery without angina pectoris: Secondary | ICD-10-CM

## 2016-11-15 DIAGNOSIS — C179 Malignant neoplasm of small intestine, unspecified: Secondary | ICD-10-CM

## 2016-11-15 DIAGNOSIS — K3189 Other diseases of stomach and duodenum: Secondary | ICD-10-CM

## 2016-11-15 LAB — COMPREHENSIVE METABOLIC PANEL
ALT: 30 U/L (ref 0–55)
ANION GAP: 10 meq/L (ref 3–11)
AST: 44 U/L — ABNORMAL HIGH (ref 5–34)
Albumin: 3.2 g/dL — ABNORMAL LOW (ref 3.5–5.0)
Alkaline Phosphatase: 197 U/L — ABNORMAL HIGH (ref 40–150)
BUN: 18.5 mg/dL (ref 7.0–26.0)
CALCIUM: 10.6 mg/dL — AB (ref 8.4–10.4)
CHLORIDE: 104 meq/L (ref 98–109)
CO2: 25 meq/L (ref 22–29)
CREATININE: 0.8 mg/dL (ref 0.7–1.3)
EGFR: 77 mL/min/{1.73_m2} — AB (ref >90)
Glucose: 104 mg/dl (ref 70–140)
POTASSIUM: 4.5 meq/L (ref 3.5–5.1)
Sodium: 139 mEq/L (ref 136–145)
Total Bilirubin: 0.42 mg/dL (ref 0.20–1.20)
Total Protein: 7.5 g/dL (ref 6.4–8.3)

## 2016-11-15 LAB — CBC WITH DIFFERENTIAL/PLATELET
BASO%: 0.6 % (ref 0.0–2.0)
BASOS ABS: 0.1 10*3/uL (ref 0.0–0.1)
EOS%: 0.8 % (ref 0.0–7.0)
Eosinophils Absolute: 0.1 10*3/uL (ref 0.0–0.5)
HEMATOCRIT: 32.1 % — AB (ref 38.4–49.9)
HGB: 10.6 g/dL — ABNORMAL LOW (ref 13.0–17.1)
LYMPH#: 1.7 10*3/uL (ref 0.9–3.3)
LYMPH%: 18.5 % (ref 14.0–49.0)
MCH: 29.5 pg (ref 27.2–33.4)
MCHC: 33.1 g/dL (ref 32.0–36.0)
MCV: 89.2 fL (ref 79.3–98.0)
MONO#: 0.7 10*3/uL (ref 0.1–0.9)
MONO%: 8 % (ref 0.0–14.0)
NEUT#: 6.5 10*3/uL (ref 1.5–6.5)
NEUT%: 72.1 % (ref 39.0–75.0)
PLATELETS: 343 10*3/uL (ref 140–400)
RBC: 3.6 10*6/uL — AB (ref 4.20–5.82)
RDW: 15.7 % — ABNORMAL HIGH (ref 11.0–14.6)
WBC: 9 10*3/uL (ref 4.0–10.3)

## 2016-11-15 LAB — CEA (IN HOUSE-CHCC): CEA (CHCC-IN HOUSE): 56.83 ng/mL — AB (ref 0.00–5.00)

## 2016-11-15 MED ORDER — TRAMADOL HCL 50 MG PO TABS: 50.0000 mg | ORAL_TABLET | Freq: Four times a day (QID) | ORAL | 0 refills | Status: DC | PRN

## 2016-11-15 NOTE — Telephone Encounter (Signed)
Call placed to [place] in pathology to request MSI and IHC for mismatch repair proteins on duodenum biopsy per order of [name].  Per Tammy, she will take care of it.

## 2016-11-15 NOTE — Progress Notes (Signed)
  [facility] OFFICE PROGRESS NOTE   Diagnosis: Duodenal carcinoma  INTERVAL HISTORY:   Mr. Norfleet was taken to a repeat upper endoscopy by Dr. Ardis Hughs on 10/28/2016. Mild pain gastritis was noted. A circumferential neoplastic process was noted extending from proximal to the major papilla to 2 cm distal to the major papilla. The mass is nonobstructing. Multiple biopsies were obtained. The pathology confirmed invasive adenocarcinoma.  Mr. Elvin reports malaise. He has early satiety. He has pain in the right subcostal region for the past 3 weeks. He takes Tylenol for the pain. No bleeding. Constipation is relieved with prunes.  Objective:  Vital signs in last 24 hours:  Blood pressure 131/66, pulse 87, temperature 98.3 F (36.8 C), temperature source Oral, resp. rate 18, height '5\' 7"'$  (1.702 m), weight 163 lb 14.4 oz (74.3 kg), SpO2 95 %.   Lymphatics: No cervical or supraclavicular nodes Resp: Lungs clear bilaterally Cardio: Regular rate and rhythm, 2/6 systolic murmur GI: No hepatosplenomegaly, no mass Vascular: No leg edema      Lab Results:  Lab Results  Component Value Date   WBC 8.1 10/19/2016   HGB 10.4 (L) 10/19/2016   HCT 32.6 (L) 10/19/2016   MCV 90.6 10/19/2016   PLT 250 10/19/2016   NEUTROABS 3.6 09/17/2016     Medications: I have reviewed the patient's current medications.  Assessment/Plan: 1. Duodenal mass, status post a biopsy 04/30/2016 revealing a tubulovillous adenoma-high-grade dysplasia not identified  Upper endoscopy 10/28/2016 confirmed a duodenal mass surrounding the major papilla, nonobstructing, biopsy confirmed invasive adenocarcinoma  2. Biliary obstruction secondary to #1, status post placement of an internal-external biliary drain on 05/01/2016  3.   Klebsiella bacteremia 04/26/2016 secondary to biliary obstruction  4.   Anemia secondary to bacteremia and chronic disease  5.   Right foot drop  6.   Coronary artery  disease  7.   Arrhythmia-status post pacemaker placement  8.   History prostate cancer treated with radiation   9.   Irregular hyperpigmented mole at the parietal scalp     Disposition:  Mr. Embleton has been diagnosed with invasive adenocarcinoma of the duodenum. I discussed treatment options with Mr. Mirarchi and his family. He is not a candidate for surgery. We will consider systemic therapy versus supportive care.  I am concerned the right subcostal pain could be related to metastatic disease. He will be scheduled for a CT of the abdomen and then return for an office visit later this week.  We prescribed tramadol to use as needed for pain.  We will request MSI testing on the duodenum biopsy.  25 minutes were spent with the patient today. The majority of the time was used for counseling and coordination of care.    Betsy Coder, MD  11/15/2016  8:16 AM

## 2016-11-15 NOTE — Telephone Encounter (Signed)
Called [facility] to check if Patient was on work que for [place]. Patient is per Kristeen Miss at [facility] [place], They will contact patient with scheduled appt.

## 2016-11-15 NOTE — Progress Notes (Signed)
Call received from pathology that Dr. Avis Epley is requesting a lavender top blood tube to be sent to [facility] lab.  Spoke with Kennyth Lose in [facility] lab and lavender tube is available from today's blood work and she will send it to [name] lab.

## 2016-11-15 NOTE — Telephone Encounter (Signed)
Gave patients son AVS and calender per 3/19 los.[facility] to contact patient with schedule for CT.

## 2016-11-16 ENCOUNTER — Other Ambulatory Visit (HOSPITAL_COMMUNITY)
Admission: RE | Admit: 2016-11-16 | Discharge: 2016-11-16 | Disposition: A | Discharge status: Home / Self Care | Payer: Medicare Other | Source: Ambulatory Visit | Attending: Oncology | Admitting: Oncology

## 2016-11-16 DIAGNOSIS — C170 Malignant neoplasm of duodenum: Secondary | ICD-10-CM | POA: Insufficient documentation

## 2016-11-17 ENCOUNTER — Ambulatory Visit
Admission: RE | Admit: 2016-11-17 | Discharge: 2016-11-17 | Disposition: A | Discharge status: Home / Self Care | Payer: Medicare Other | Source: Ambulatory Visit | Attending: Oncology | Admitting: Oncology

## 2016-11-17 DIAGNOSIS — C179 Malignant neoplasm of small intestine, unspecified: Secondary | ICD-10-CM

## 2016-11-17 MED ORDER — IOPAMIDOL (ISOVUE-300) INJECTION 61%
100.0000 mL | Freq: Once | INTRAVENOUS | Status: AC | PRN
  Administered 2016-11-17: 100 mL via INTRAVENOUS

## 2016-11-19 ENCOUNTER — Other Ambulatory Visit: Payer: Self-pay | Admitting: *Deleted

## 2016-11-19 ENCOUNTER — Telehealth: Payer: Self-pay | Admitting: Oncology

## 2016-11-19 ENCOUNTER — Other Ambulatory Visit (HOSPITAL_BASED_OUTPATIENT_CLINIC_OR_DEPARTMENT_OTHER): Payer: Medicare Other

## 2016-11-19 ENCOUNTER — Ambulatory Visit (HOSPITAL_BASED_OUTPATIENT_CLINIC_OR_DEPARTMENT_OTHER): Payer: Medicare Other | Admitting: Oncology

## 2016-11-19 VITALS — BP 139/68 | HR 93 | Temp 97.9°F | Resp 18 | Wt 164.1 lb

## 2016-11-19 DIAGNOSIS — Z8546 Personal history of malignant neoplasm of prostate: Secondary | ICD-10-CM

## 2016-11-19 DIAGNOSIS — C170 Malignant neoplasm of duodenum: Secondary | ICD-10-CM

## 2016-11-19 DIAGNOSIS — D6489 Other specified anemias: Secondary | ICD-10-CM | POA: Diagnosis not present

## 2016-11-19 DIAGNOSIS — C179 Malignant neoplasm of small intestine, unspecified: Secondary | ICD-10-CM

## 2016-11-19 DIAGNOSIS — K3189 Other diseases of stomach and duodenum: Secondary | ICD-10-CM

## 2016-11-19 DIAGNOSIS — Z7189 Other specified counseling: Secondary | ICD-10-CM

## 2016-11-19 LAB — COMPREHENSIVE METABOLIC PANEL
ALBUMIN: 3.3 g/dL — AB (ref 3.5–5.0)
ALK PHOS: 203 U/L — AB (ref 40–150)
ALT: 30 U/L (ref 0–55)
AST: 47 U/L — ABNORMAL HIGH (ref 5–34)
Anion Gap: 8 mEq/L (ref 3–11)
BUN: 22.5 mg/dL (ref 7.0–26.0)
CO2: 27 meq/L (ref 22–29)
CREATININE: 0.9 mg/dL (ref 0.7–1.3)
Calcium: 10.6 mg/dL — ABNORMAL HIGH (ref 8.4–10.4)
Chloride: 104 mEq/L (ref 98–109)
EGFR: 76 mL/min/{1.73_m2} — AB (ref >90)
GLUCOSE: 98 mg/dL (ref 70–140)
Potassium: 4.8 mEq/L (ref 3.5–5.1)
SODIUM: 139 meq/L (ref 136–145)
TOTAL PROTEIN: 7.4 g/dL (ref 6.4–8.3)
Total Bilirubin: 0.42 mg/dL (ref 0.20–1.20)

## 2016-11-19 MED ORDER — LIDOCAINE-PRILOCAINE 2.5-2.5 % EX CREA: TOPICAL_CREAM | CUTANEOUS | 0 refills | Status: DC

## 2016-11-19 MED ORDER — PROCHLORPERAZINE MALEATE 5 MG PO TABS: 5.0000 mg | ORAL_TABLET | Freq: Four times a day (QID) | ORAL | 0 refills | Status: DC | PRN

## 2016-11-19 NOTE — Telephone Encounter (Signed)
Appointments scheduled per 3.23.18 LOS. Patient given AVS report and calendars with future scheduled appointments. Called IR to schedule port-a-cath placement for patient. Next available 11/29/16. Patient scheduled for this date. Desk nurse notified.

## 2016-11-19 NOTE — Progress Notes (Signed)
[place] OFFICE PROGRESS NOTE   Diagnosis: Small bowel carcinoma  INTERVAL HISTORY:   [name] turns as scheduled. He reports improvement in the right upper quadrant pain. He has not tried the tramadol. He has a poor appetite, but he has not lost weight. 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 No neuropathy symptoms in the hands.    Objective:  Vital signs in last 24 hours:  Blood pressure 139/68, pulse 93, temperature 97.9 F (36.6 C), temperature source Oral, resp. rate 18, weight 164 lb 1.6 oz (74.4 kg), SpO2 96 %.   Resp:  lungs clear bilaterally Cardio:  regular rate and rhythm GI:  no hepatosplenomegaly, nontender, no mass Vascular:  no leg edema   Lab Results:  Lab Results  Component Value Date   WBC 9.0 11/15/2016   HGB 10.6 (L) 11/15/2016   HCT 32.1 (L) 11/15/2016   MCV 89.2 11/15/2016   PLT 343 11/15/2016   NEUTROABS 6.5 11/15/2016  Calcium 10.6, albumin 3.3  11/15/2016: CEA-56.83   Imaging:  Ct Abdomen Pelvis W Contrast  Result Date: 11/17/2016 CLINICAL DATA:  New diagnosis of small bowel malignancy. Right abdominal pain with low-grade fever and constipation for 3 weeks. EXAM: CT ABDOMEN AND PELVIS WITH CONTRAST TECHNIQUE: Multidetector CT imaging of the abdomen and pelvis was performed using the standard protocol following bolus administration of intravenous contrast. CONTRAST:  163mL ISOVUE-300 IOPAMIDOL (ISOVUE-300) INJECTION 61% COMPARISON:  Chest CT 06/03/2016.  Abdominal CT 05/25/2016. FINDINGS: Lower chest: Interval resolution of previously demonstrated right pleural effusion. The lung bases are clear. The heart is enlarged. Patient has a pacemaker and atherosclerosis of the thoracic aorta and coronary arteries. Hepatobiliary: Interval development of extensive multifocal hepatic metastatic disease. Representative lesions include a 4.5 x 3.8 cm lesion in the dome of the right hepatic lobe (image 18), a  5.6 x 4.0 cm lesion anteriorly in the right hepatic lobe (image 17) and a 4.8 x 3.7 cm lesion inferiorly in the right hepatic lobe (image 34). There are several small lesions within the left hepatic lobe. Metallic biliary stent is present with pneumobilia implying stent patency. The gallbladder appears unremarkable. Pancreas: Pancreatic atrophy and diffuse dilatation of the pancreatic duct are again noted, similar to the previous study. No enlarging soft tissue components are identified. There is no surrounding inflammation. Spleen: Normal in size without focal abnormality. Adrenals/Urinary Tract: Both adrenal glands appear normal. Stable bilateral renal cysts and nonobstructing calculus in the lower pole of the right kidney. No evidence of ureteral calculus or hydronephrosis. The bladder appears unremarkable. Stomach/Bowel: The stomach is decompressed. The proximal duodenal lumen surrounding the biliary stent appears distended with soft tissue density which could reflect progressive tumor. There is no evidence of bowel obstruction, and the enteric contrast has passed into the distal small bowel and colon. No other bowel wall thickening or surrounding inflammatory changes. Mild sigmoid colon diverticular changes are present. Vascular/Lymphatic: There are no enlarged abdominal or pelvic lymph nodes. Stable small portacaval node. Aortic and branch vessel atherosclerosis. No acute vascular findings are seen. Reproductive: The prostate gland and seminal vesicles appear unremarkable. Other: No ascites or peritoneal nodularity. Musculoskeletal: No acute or significant osseous findings. There is a thoracolumbar scoliosis and an anterolisthesis at L5-S1, likely secondary to chronic facet disease. Previous proximal right femoral ORIF. IMPRESSION: 1. Interval development of multifocal hepatic metastatic disease. 2. Potential progression of proximal small bowel tumor with increased soft tissue density surrounding the metallic  biliary stent. No evidence  of resulting bowel or biliary obstruction (persistent pneumobilia). 3. Stable pancreatic atrophy and diffuse ductal dilatation. 4. Resolution of right pleural effusion. Electronically Signed   By: Richardean Sale M.D.   On: 11/17/2016 14:21    Medications: I have reviewed the patient's current medications.  Assessment/Plan: 1. Duodenal mass, status post a biopsy 04/30/2016 revealing a tubulovillous adenoma-high-grade dysplasia not identified  Upper endoscopy 10/28/2016 confirmed a duodenal mass surrounding the major papilla, nonobstructing, biopsy confirmed invasive adenocarcinoma  CT abdomen/pelvis 11/17/2016-multiple hepatic metastases progression of the proximal small bowel tumor, no biliary obstruction  2. Biliary obstruction secondary to #1, status post placement of an internal-external biliary drain on 05/01/2016, common bile duct stent 06/01/2016  3. Klebsiella bacteremia 04/26/2016 secondary to biliary obstruction  4. Anemia secondary to bacteremia and chronic disease  5. Right foot drop  6. Coronary artery disease  7. Arrhythmia-status post pacemaker placement  8. History prostate cancer treated with radiation  9. Irregular hyperpigmented mole at the parietal scalp  10. Mild hypercalcemia-he will discontinue the potassium supplement     Disposition: [name] has been diagnosed with metastatic small bowel carcinoma. I discussed the prognosis and treatment options with him. He understands no therapy will be curative. We discussed comfort/supportive care versus a trial of systemic therapy. We discussed single agent capecitabine and FOLFOX chemotherapy. He would like to proceed with FOLFOX.  We reviewed the potential toxicities associated with the FOLFOX regimen including the chance for nausea/vomiting, mucositis, diarrhea, alopecia, and hematologic toxicity. We discussed the hyperpigmentation, rash, sun sensitivity, and  hand/foot syndrome associated with 5 fluorouracil. We reviewed the allergic reaction and various types of neuropathy associated with oxaliplatin. He understands the potential of worsening peripheral neuropathy with oxaliplatin. He agrees to proceed. He will attend a chemotherapy teaching class. We referred him for placement of a Port-A-Cath.   The plan is to begin cycle 1 FOLFOX on 11/25/2016. He will return for an office visit prior to cycle 2 chemotherapy. We will follow his clinical status, the CEA, and a repeat CT as markers of response to therapy.  30 minutes were spent with the patient today. The majority of the time was used for counseling and coordination of care. A chemotherapy plan was entered.   Betsy Coder, MD  11/19/2016  12:30 PM

## 2016-11-19 NOTE — Telephone Encounter (Signed)
Called patient to inform him of changes in appointments.

## 2016-11-19 NOTE — Progress Notes (Signed)
START OFF PATHWAY REGIMEN - [Other Dx]   [id number] (q14d):   A cycle is every 14 days:     Oxaliplatin      Leucovorin      5-Fluorouracil      5-Fluorouracil   **Always confirm dose/schedule in your pharmacy ordering system**    Intent of Therapy: Non-Curative / Palliative Intent, Discussed with Patient

## 2016-11-22 ENCOUNTER — Other Ambulatory Visit: Payer: Medicare Other

## 2016-11-23 ENCOUNTER — Telehealth: Payer: Self-pay | Admitting: *Deleted

## 2016-11-23 NOTE — Telephone Encounter (Signed)
Call from pt asking for clarification of 11/29/16 appts. Questions answered and appointments reviewed. He voiced understanding. Encouraged him to call with any additional questions.

## 2016-11-25 ENCOUNTER — Other Ambulatory Visit: Payer: Self-pay | Admitting: Radiology

## 2016-11-25 ENCOUNTER — Other Ambulatory Visit: Payer: Medicare Other

## 2016-11-25 ENCOUNTER — Ambulatory Visit: Payer: Medicare Other

## 2016-11-26 NOTE — Patient Instructions (Signed)
Check in to Radiology at [facility] at [place].  NPO after midnight, have a driver.

## 2016-11-29 ENCOUNTER — Ambulatory Visit (HOSPITAL_COMMUNITY)
Admission: RE | Admit: 2016-11-29 | Discharge: 2016-11-29 | Disposition: A | Discharge status: Home / Self Care | Payer: Medicare Other | Source: Ambulatory Visit | Attending: Oncology | Admitting: Oncology

## 2016-11-29 ENCOUNTER — Other Ambulatory Visit: Payer: Self-pay | Admitting: Oncology

## 2016-11-29 ENCOUNTER — Encounter (HOSPITAL_COMMUNITY): Payer: Self-pay

## 2016-11-29 DIAGNOSIS — F329 Major depressive disorder, single episode, unspecified: Secondary | ICD-10-CM | POA: Diagnosis not present

## 2016-11-29 DIAGNOSIS — Z923 Personal history of irradiation: Secondary | ICD-10-CM | POA: Diagnosis not present

## 2016-11-29 DIAGNOSIS — E785 Hyperlipidemia, unspecified: Secondary | ICD-10-CM | POA: Insufficient documentation

## 2016-11-29 DIAGNOSIS — Z951 Presence of aortocoronary bypass graft: Secondary | ICD-10-CM | POA: Diagnosis not present

## 2016-11-29 DIAGNOSIS — I2510 Atherosclerotic heart disease of native coronary artery without angina pectoris: Secondary | ICD-10-CM | POA: Insufficient documentation

## 2016-11-29 DIAGNOSIS — C179 Malignant neoplasm of small intestine, unspecified: Secondary | ICD-10-CM

## 2016-11-29 DIAGNOSIS — I739 Peripheral vascular disease, unspecified: Secondary | ICD-10-CM | POA: Diagnosis not present

## 2016-11-29 DIAGNOSIS — I110 Hypertensive heart disease with heart failure: Secondary | ICD-10-CM | POA: Insufficient documentation

## 2016-11-29 DIAGNOSIS — I5020 Unspecified systolic (congestive) heart failure: Secondary | ICD-10-CM | POA: Insufficient documentation

## 2016-11-29 DIAGNOSIS — J45909 Unspecified asthma, uncomplicated: Secondary | ICD-10-CM | POA: Diagnosis not present

## 2016-11-29 DIAGNOSIS — Z8249 Family history of ischemic heart disease and other diseases of the circulatory system: Secondary | ICD-10-CM | POA: Diagnosis not present

## 2016-11-29 DIAGNOSIS — Z950 Presence of cardiac pacemaker: Secondary | ICD-10-CM | POA: Diagnosis not present

## 2016-11-29 DIAGNOSIS — Z9981 Dependence on supplemental oxygen: Secondary | ICD-10-CM | POA: Insufficient documentation

## 2016-11-29 DIAGNOSIS — Z955 Presence of coronary angioplasty implant and graft: Secondary | ICD-10-CM | POA: Diagnosis not present

## 2016-11-29 DIAGNOSIS — G629 Polyneuropathy, unspecified: Secondary | ICD-10-CM | POA: Insufficient documentation

## 2016-11-29 DIAGNOSIS — G4700 Insomnia, unspecified: Secondary | ICD-10-CM | POA: Diagnosis not present

## 2016-11-29 DIAGNOSIS — Z8546 Personal history of malignant neoplasm of prostate: Secondary | ICD-10-CM | POA: Insufficient documentation

## 2016-11-29 DIAGNOSIS — K219 Gastro-esophageal reflux disease without esophagitis: Secondary | ICD-10-CM | POA: Insufficient documentation

## 2016-11-29 DIAGNOSIS — I252 Old myocardial infarction: Secondary | ICD-10-CM | POA: Diagnosis not present

## 2016-11-29 DIAGNOSIS — I4892 Unspecified atrial flutter: Secondary | ICD-10-CM | POA: Diagnosis not present

## 2016-11-29 DIAGNOSIS — Z7951 Long term (current) use of inhaled steroids: Secondary | ICD-10-CM | POA: Diagnosis not present

## 2016-11-29 DIAGNOSIS — M1990 Unspecified osteoarthritis, unspecified site: Secondary | ICD-10-CM | POA: Insufficient documentation

## 2016-11-29 DIAGNOSIS — Z7982 Long term (current) use of aspirin: Secondary | ICD-10-CM | POA: Diagnosis not present

## 2016-11-29 HISTORY — PX: IR US GUIDE VASC ACCESS RIGHT: IMG2390

## 2016-11-29 HISTORY — PX: IR FLUORO GUIDE PORT INSERTION RIGHT: IMG5741

## 2016-11-29 LAB — CBC WITH DIFFERENTIAL/PLATELET
Basophils Absolute: 0 10*3/uL (ref 0.0–0.1)
Basophils Relative: 0 %
EOS ABS: 0.1 10*3/uL (ref 0.0–0.7)
EOS PCT: 1 %
HCT: 32.4 % — ABNORMAL LOW (ref 39.0–52.0)
HEMOGLOBIN: 10.5 g/dL — AB (ref 13.0–17.0)
LYMPHS ABS: 1.5 10*3/uL (ref 0.7–4.0)
Lymphocytes Relative: 15 %
MCH: 29.3 pg (ref 26.0–34.0)
MCHC: 32.4 g/dL (ref 30.0–36.0)
MCV: 90.5 fL (ref 78.0–100.0)
MONO ABS: 1 10*3/uL (ref 0.1–1.0)
Monocytes Relative: 9 %
NEUTROS PCT: 75 %
Neutro Abs: 7.6 10*3/uL (ref 1.7–7.7)
Platelets: 366 10*3/uL (ref 150–400)
RBC: 3.58 MIL/uL — ABNORMAL LOW (ref 4.22–5.81)
RDW: 15.1 % (ref 11.5–15.5)
WBC: 10.1 10*3/uL (ref 4.0–10.5)

## 2016-11-29 LAB — PROTIME-INR
INR: 1.11
PROTHROMBIN TIME: 14.3 s (ref 11.4–15.2)

## 2016-11-29 MED ORDER — HEPARIN SOD (PORK) LOCK FLUSH 100 UNIT/ML IV SOLN
INTRAVENOUS | Status: AC
  Filled 2016-11-29: qty 5

## 2016-11-29 MED ORDER — CEFAZOLIN SODIUM-DEXTROSE 2-4 GM/100ML-% IV SOLN
INTRAVENOUS | Status: AC
  Filled 2016-11-29: qty 100

## 2016-11-29 MED ORDER — FENTANYL CITRATE (PF) 100 MCG/2ML IJ SOLN
INTRAMUSCULAR | Status: AC | PRN
  Administered 2016-11-29 (×2): 25 ug via INTRAVENOUS

## 2016-11-29 MED ORDER — MIDAZOLAM HCL 2 MG/2ML IJ SOLN
INTRAMUSCULAR | Status: AC
  Filled 2016-11-29: qty 6

## 2016-11-29 MED ORDER — SODIUM CHLORIDE 0.9 % IV SOLN
INTRAVENOUS | Status: DC
  Administered 2016-11-29: 10:00:00 via INTRAVENOUS

## 2016-11-29 MED ORDER — LIDOCAINE-EPINEPHRINE (PF) 2 %-1:200000 IJ SOLN
INTRAMUSCULAR | Status: AC | PRN
  Administered 2016-11-29: 10 mL via INTRADERMAL

## 2016-11-29 MED ORDER — FENTANYL CITRATE (PF) 100 MCG/2ML IJ SOLN
INTRAMUSCULAR | Status: AC
  Filled 2016-11-29: qty 4

## 2016-11-29 MED ORDER — LIDOCAINE HCL 1 % IJ SOLN
INTRAMUSCULAR | Status: AC
  Filled 2016-11-29: qty 20

## 2016-11-29 MED ORDER — MIDAZOLAM HCL 2 MG/2ML IJ SOLN
INTRAMUSCULAR | Status: AC | PRN
  Administered 2016-11-29 (×2): 1 mg via INTRAVENOUS

## 2016-11-29 MED ORDER — CEFAZOLIN SODIUM-DEXTROSE 2-4 GM/100ML-% IV SOLN
2.0000 g | INTRAVENOUS | Status: AC
  Administered 2016-11-29: 2 g via INTRAVENOUS

## 2016-11-29 MED ORDER — LIDOCAINE HCL 1 % IJ SOLN
INTRAMUSCULAR | Status: AC | PRN
  Administered 2016-11-29: 10 mL via INTRADERMAL

## 2016-11-29 MED ORDER — LIDOCAINE-EPINEPHRINE (PF) 2 %-1:200000 IJ SOLN
INTRAMUSCULAR | Status: AC
  Filled 2016-11-29: qty 20

## 2016-11-29 MED ORDER — HEPARIN SOD (PORK) LOCK FLUSH 100 UNIT/ML IV SOLN
INTRAVENOUS | Status: AC | PRN
  Administered 2016-11-29: 500 [IU]

## 2016-11-29 NOTE — H&P (Signed)
Chief Complaint: Patient was seen in consultation today for Port-A-Cath placement  Referring Physician(s):  Sherrill,Gary B  Supervising Physician: Markus Daft  Patient Status: [facility] - Out-pt  History of Present Illness: [name] is a 83 y.o. male with past medical history of anemia, arthritis, asthma, HTN, CAD s/p CABG, HLD, MI who presents with recent diagnosis of small bowel carcinoma.   IR consulted for Port-A-cath placement at the request of [name].   Patient has been NPO.  He does not take blood thinners.  He has been in his usual state of health.   Past Medical History:  Diagnosis Date  . Anemia 2014   was on iron pills and then was able to come off  . Arthritis    "all over"  . Asthma       . Atrial flutter ([place])    a. diagnosed 03/2015  . Bacteremia 03/2016  . Complication of anesthesia    B/P dropped low and had to stay in recovery longer  . Coronary artery disease    a. s/p CABG 1986 b. redo CABG 1993 c. PTCA 2008 d. DES 2010  . Depression       . Diverticulosis   . GERD (gastroesophageal reflux disease)       . Hepatitis    hepatitis- long time ago, occupational contamination   . Hiatal hernia   . History of kidney stones   . Hyperlipemia   . Hypertension       . Insomnia       . Myocardial infarction LAST 2010   MI 1986 WITH CABG DONE  . On home oxygen therapy    "2L; just at night" (01/31/2015)  . Peripheral neuropathy (HCC)    TOES BOTH FEET  . Peripheral vascular disease (HCC)    thrombus- in leg- many yrs. ago- ?R- coumadin x1 mth  . Pneumonia 1960's AND FALL 2017  . Presence of permanent cardiac pacemaker   . Prostate cancer [facility])    radiation therapy- 2002  . Systolic CHF ([place])       . VT (ventricular tachycardia) ([place]) 06/2012   a. in the setting of STEMI b. on amio during hospitalization    Past Surgical History:  Procedure Laterality Date  . APPENDECTOMY  1943  . BILIARY DRAINAGE  05/14/2016  . CATARACT EXTRACTION  W/ INTRAOCULAR LENS  IMPLANT, BILATERAL    . COLONOSCOPY    . CORONARY ARTERY BYPASS GRAFT  1986;  1993   CABG X 5; CABG X 1  . CYSTOSCOPY W/ URETERAL STENT PLACEMENT Right 02/28/2013   Procedure: CYSTOSCOPY WITH RETROGRADE PYELOGRAM/URETERAL STENT PLACEMENT;  Surgeon: Dutch Gray, MD;  Location: [facility] OR[facility];  Service: Urology;  Laterality: Right;  . CYSTOSCOPY WITH URETEROSCOPY AND STENT PLACEMENT Right 03/23/2013   Procedure: RIGHT URETEROSCOPY, LASER LITHO AND STENT PLACEMENT;  Surgeon: Fredricka Bonine, MD;  Location: [facility];  Service: Urology;  Laterality: Right;  . EP IMPLANTABLE DEVICE N/A 04/11/2015   Procedure: Pacemaker Implant;  Surgeon: Will Meredith Leeds, MD;  Location: [place] CV LAB;  Service: Cardiovascular;  Laterality: N/A;  . ERCP N/A 04/30/2016   Procedure: ENDOSCOPIC RETROGRADE CHOLANGIOPANCREATOGRAPHY (ERCP);  Surgeon: Milus Banister, MD;  Location: [place];  Service: Endoscopy;  Laterality: N/A;  . ESOPHAGOGASTRODUODENOSCOPY (EGD) WITH PROPOFOL N/A 10/28/2016   Procedure: ESOPHAGOGASTRODUODENOSCOPY (EGD) WITH PROPOFOL;  Surgeon: Milus Banister, MD;  [facility];  Service: Endoscopy;  Laterality: N/A;  . FOOT FUSION Left 2000  heel  . FRACTURE SURGERY     heel- crushed -2002,  (hardware)   . HAMMER TOE SURGERY Right    little toe  . HARDWARE REMOVAL Right 2002   great toe; Dr. Sharol Given  . HOLMIUM LASER APPLICATION Right 1/61/0960   Procedure: HOLMIUM LASER APPLICATION;  Surgeon: Fredricka Bonine, MD;  [facility];  Service: Urology;  Laterality: Right;  . INGUINAL HERNIA REPAIR Left 01/31/2015   Procedure: OPEN REPAIR RECURRENT LEFT INGUINAL HERNIA;  Surgeon: Fanny Skates, MD;  Location: [place];  Service: General;  Laterality: Left;  . INSERTION OF MESH Left 01/31/2015   Procedure: INSERTION OF MESH;  Surgeon: Fanny Skates, MD;  Location: [place];  Service: General;  Laterality: Left;  . INTRAMEDULLARY (IM) NAIL INTERTROCHANTERIC Right  08/29/2013   Procedure: INTRAMEDULLARY (IM) NAIL INTERTROCHANTRIC;  Surgeon: Marianna Payment, MD;  Location: [place];  Service: Orthopedics;  Laterality: Right;  . IR GENERIC HISTORICAL  05/01/2016   IR INT EXT BILIARY DRAIN WITH CHOLANGIOGRAM 05/01/2016 Corrie Mckusick, DO [facility]-INTERV RAD  . IR GENERIC HISTORICAL  06/01/2016   IR BILIARY STENT(S) EXISTING ACCESS INC DILATION CATH EXCHANGE 06/01/2016 Corrie Mckusick, DO [facility]INTER[facility]  . IR GENERIC HISTORICAL  05/20/2016   IR RADIOLOGIST EVAL & MGMT 05/20/2016 Greggory Keen, MD [facility] INTER[facility] RAD  . JOINT REPLACEMENT     R great toe  . KNEE ARTHROSCOPY Right X 2  . LEFT AND RIGHT HEART CATHETERIZATION WITH CORONARY ANGIOGRAM N/A 07/15/2012   Procedure: LEFT AND RIGHT HEART CATHETERIZATION WITH CORONARY ANGIOGRAM;  Surgeon: Jettie Booze, MD;  Location: [facility] CATH LAB;  Service: Cardiovascular;  Laterality: N/A;  . LUMBAR LAMINECTOMY/DECOMPRESSION MICRODISCECTOMY  08/10/2011   Procedure: LUMBAR LAMINECTOMY/DECOMPRESSION MICRODISCECTOMY;  Surgeon: Cooper Render Pool;  Location: [place] NEURO ORS;  Service: Neurosurgery;  Laterality: Right;  RIGHT Lumbar five-sacral one LAMINECTOMY, MICRODISCECTOMY  . LUMBAR WOUND DEBRIDEMENT  08/22/2011   Procedure: LUMBAR WOUND DEBRIDEMENT;  Surgeon: Eustace Moore;  Location: [place] NEURO ORS;  Service: Neurosurgery;  Laterality: N/A;  Repair of CSF Leak requiring laminectomy  . PERCUTANEOUS CORONARY STENT INTERVENTION (PCI-S) N/A 07/15/2012   Procedure: PERCUTANEOUS CORONARY STENT INTERVENTION (PCI-S);  Surgeon: Jettie Booze, MD;  Location: [facility] CATH LAB;  Service: Cardiovascular;  Laterality: N/A;  . RHINOPLASTY  [id number]  . RIGHT HIP PINNING   5 YRS AGO   DR SUE  . SQUAMOUS CELL AREA REMOVED FROM LEFT LOWER ARM  DEC 2017 AND MORE REMOVED 10-18-16 AND AREA FROM RIGHT EAR REMOVED IN PAST  . TEE WITHOUT CARDIOVERSION N/A 04/28/2016   Procedure: TRANSESOPHAGEAL ECHOCARDIOGRAM (TEE);  Surgeon: Skeet Latch, MD;  Location: [facility]  ENDOSCOPY;  Service: Cardiovascular;  Laterality: N/A;  . TONSILLECTOMY  1940  . TRANSURETHRAL RESECTION OF PROSTATE  x2    Allergies: Patient has no known allergies.  Medications: Prior to Admission medications   Medication Sig Start Date End Date Taking? Authorizing Provider  acetaminophen (TYLENOL) 500 MG tablet Take 1,000 mg by mouth every 6 (six) hours as needed for mild pain, moderate pain or fever.    Yes Historical Provider, MD  albuterol (PROVENTIL HFA;VENTOLIN HFA) 108 (90 Base) MCG/ACT inhaler Inhale 2 puffs into the lungs every 6 (six) hours as needed for wheezing or shortness of breath. 12/30/15  Yes Mercy Riding, MD  Amino Acids-Protein Hydrolys (FEEDING SUPPLEMENT, PRO-STAT SUGAR FREE 64,) LIQD Take 30 mLs by mouth 2 (two) times daily. Patient taking differently: Take 30 mLs by mouth daily with lunch.  05/02/16  Yes Sela Hilding, MD  aspirin EC 81 MG EC tablet Take 1 tablet (81 mg total) by mouth daily. Patient taking differently: Take 81 mg by mouth at bedtime.  04/12/15  Yes Luke K Kilroy, PA-C  budesonide-formoterol (SYMBICORT) 80-4.5 MCG/ACT inhaler Inhale 2 puffs into the lungs 2 (two) times daily. 09/12/12  Yes Tanda Rockers, MD  carbamazepine (TEGRETOL) 100 MG chewable tablet Chew 300 mg by mouth 2 (two) times daily.   Yes Historical Provider, MD  docusate sodium (COLACE) 100 MG capsule Take 100 mg by mouth at bedtime.    Yes Historical Provider, MD  feeding supplement, ENSURE ENLIVE, (ENSURE ENLIVE) LIQD Take 237 mLs by mouth 2 (two) times daily between meals. 05/02/16  Yes Sela Hilding, MD  ferrous sulfate 325 (65 FE) MG EC tablet Take 325 mg by mouth 3 (three) times daily with meals.   Yes Historical Provider, MD  finasteride (PROSCAR) 5 MG tablet Take 5 mg by mouth every morning.    Yes Historical Provider, MD  folic acid (FOLVITE) 151 MCG tablet Take 400 mcg by mouth daily.   Yes Historical Provider, MD  furosemide (LASIX) 40 MG tablet Take 40 mg by mouth  daily.   Yes Historical Provider, MD  ipratropium (ATROVENT HFA) 17 MCG/ACT inhaler Inhale 2 puffs into the lungs 2 (two) times daily as needed for wheezing.    Yes Historical Provider, MD  ipratropium (ATROVENT) 0.02 % nebulizer solution Take 2.5 mLs (0.5 mg total) by nebulization 2 (two) times daily as needed for wheezing or shortness of breath. 05/02/16  Yes Sela Hilding, MD  isosorbide mononitrate (IMDUR) 30 MG 24 hr tablet Take 1 tablet (30 mg total) by mouth daily. 05/02/16  Yes Sela Hilding, MD  Melatonin 3 MG TABS Take 3 mg by mouth at bedtime.   Yes Historical Provider, MD  metoprolol succinate (TOPROL-XL) 12.5 mg TB24 24 hr tablet Take 12.5 mg by mouth daily.   Yes Historical Provider, MD  nitroGLYCERIN (NITROSTAT) 0.4 MG SL tablet Place 1 tablet (0.4 mg total) under the tongue every 5 (five) minutes as needed for chest pain (As needed for chest pain). 05/02/16  Yes Sela Hilding, MD  omeprazole (PRILOSEC) 20 MG capsule Take 20 mg by mouth daily.   Yes Historical Provider, MD  OXYGEN Inhale 2 L/min into the lungs at bedtime.   Yes Historical Provider, MD  potassium chloride SA (K-DUR,KLOR-CON) 20 MEQ tablet Take 20 mEq by mouth daily with lunch.    Yes Historical Provider, MD  ranitidine (ZANTAC) 150 MG tablet Take 1 tablet (150 mg total) by mouth 2 (two) times daily. Patient taking differently: Take 300 mg by mouth at bedtime.  06/21/16  Yes Elsia Nigel Sloop, DO  senna (SENOKOT) 8.6 MG tablet Take 1 tablet by mouth daily.    Yes Historical Provider, MD  sertraline (ZOLOFT) 100 MG tablet Take 150 mg by mouth every morning.    Yes Historical Provider, MD  tamsulosin (FLOMAX) 0.4 MG CAPS capsule Take 1 capsule (0.4 mg total) by mouth daily. Patient taking differently: Take 0.4 mg by mouth every evening.  09/06/14  Yes [place] [employer], DO  traZODone (DESYREL) 50 MG tablet TAKE ONE TABLET BY MOUTH AT BEDTIME 09/01/16  Yes Elsia Nigel Sloop, DO  Ca Phosphate-Cholecalciferol (CALTRATE GUMMY BITES)  250-400 MG-UNIT CHEW Chew 2 tablets by mouth 2 (two) times daily.    Historical Provider, MD  ipratropium-albuterol (DUONEB) 0.5-2.5 (3) MG/3ML SOLN Take 3 mLs by nebulization every 6 (six)  hours as needed. Patient taking differently: Take 3 mLs by nebulization every 6 (six) hours as needed (for shortness of breath).  05/31/16   Bufford Lope, DO  lidocaine-prilocaine (EMLA) cream Apply cream to port one hour prior to port access and cover with plastic wrap 11/19/16   Ladell Pier, MD  Multiple Vitamin (MULTIVITAMIN WITH MINERALS) TABS tablet Take 1 tablet by mouth daily with supper.    Historical Provider, MD  prochlorperazine (COMPAZINE) 5 MG tablet Take 1 tablet (5 mg total) by mouth every 6 (six) hours as needed for nausea or vomiting. 11/19/16   Ladell Pier, MD  traMADol (ULTRAM) 50 MG tablet Take 1 tablet (50 mg total) by mouth every 6 (six) hours as needed. 11/15/16   Ladell Pier, MD     Family History  Problem Relation Age of Onset  . Heart attack Mother   . Addison's disease Father   . Heart attack Sister   . Heart disease Brother   . Prostate cancer Brother   . Heart disease Sister   . Heart disease Brother   . Lung cancer Brother   . Anesthesia problems Neg Hx   . Hypotension Neg Hx   . Malignant hyperthermia Neg Hx   . Pseudochol deficiency Neg Hx     Social History   Social History  . Marital status: Married    Spouse name: N/A  . Number of children: N/A  . Years of education: N/A   Social History Main Topics  . Smoking status: Never Smoker  . Smokeless tobacco: Never Used  . Alcohol use No  . Drug use: No  . Sexual activity: Not Asked   Other Topics Concern  . None   Social History Narrative   Married, lives with spouse   2 children - daughter has MS, recent pulmonary and cardiac arrest   OCCUPATION: retired from the lab at [employer], MLT    Review of Systems  Constitutional: Negative for fatigue and fever.  Respiratory: Negative for cough and  shortness of breath.   Cardiovascular: Negative for chest pain.  Psychiatric/Behavioral: Negative for behavioral problems and confusion.    Vital Signs: BP (!) 122/57 (BP Location: Left Arm)   Pulse 66   Temp 98.1 F (36.7 C) (Oral)   Resp 18   Ht 5\' 7"  (1.702 m)   Wt 164 lb 1.6 oz (74.4 kg)   SpO2 97%   BMI 25.70 kg/m   Physical Exam  Constitutional: He is oriented to person, place, and time. He appears well-developed.  Cardiovascular: Normal rate, regular rhythm and normal heart sounds.   Pulmonary/Chest: Effort normal and breath sounds normal. No respiratory distress.  Neurological: He is alert and oriented to person, place, and time.  Skin: Skin is warm and dry.  Psychiatric: He has a normal mood and affect. His behavior is normal. Judgment and thought content normal.  Nursing note and vitals reviewed.   Mallampati Score:  MD Evaluation Airway: WNL Heart: WNL Abdomen: WNL Chest/ Lungs: WNL ASA  Classification: 3 Mallampati/Airway Score: Two  Imaging: Ct Abdomen Pelvis W Contrast  Result Date: 11/17/2016 CLINICAL DATA:  New diagnosis of small bowel malignancy. Right abdominal pain with low-grade fever and constipation for 3 weeks. EXAM: CT ABDOMEN AND PELVIS WITH CONTRAST TECHNIQUE: Multidetector CT imaging of the abdomen and pelvis was performed using the standard protocol following bolus administration of intravenous contrast. CONTRAST:  132mL ISOVUE-300 IOPAMIDOL (ISOVUE-300) INJECTION 61% COMPARISON:  Chest CT 06/03/2016.  Abdominal CT 05/25/2016. FINDINGS: Lower chest: Interval resolution of previously demonstrated right pleural effusion. The lung bases are clear. The heart is enlarged. Patient has a pacemaker and atherosclerosis of the thoracic aorta and coronary arteries. Hepatobiliary: Interval development of extensive multifocal hepatic metastatic disease. Representative lesions include a 4.5 x 3.8 cm lesion in the dome of the right hepatic lobe (image 18), a 5.6 x  4.0 cm lesion anteriorly in the right hepatic lobe (image 17) and a 4.8 x 3.7 cm lesion inferiorly in the right hepatic lobe (image 34). There are several small lesions within the left hepatic lobe. Metallic biliary stent is present with pneumobilia implying stent patency. The gallbladder appears unremarkable. Pancreas: Pancreatic atrophy and diffuse dilatation of the pancreatic duct are again noted, similar to the previous study. No enlarging soft tissue components are identified. There is no surrounding inflammation. Spleen: Normal in size without focal abnormality. Adrenals/Urinary Tract: Both adrenal glands appear normal. Stable bilateral renal cysts and nonobstructing calculus in the lower pole of the right kidney. No evidence of ureteral calculus or hydronephrosis. The bladder appears unremarkable. Stomach/Bowel: The stomach is decompressed. The proximal duodenal lumen surrounding the biliary stent appears distended with soft tissue density which could reflect progressive tumor. There is no evidence of bowel obstruction, and the enteric contrast has passed into the distal small bowel and colon. No other bowel wall thickening or surrounding inflammatory changes. Mild sigmoid colon diverticular changes are present. Vascular/Lymphatic: There are no enlarged abdominal or pelvic lymph nodes. Stable small portacaval node. Aortic and branch vessel atherosclerosis. No acute vascular findings are seen. Reproductive: The prostate gland and seminal vesicles appear unremarkable. Other: No ascites or peritoneal nodularity. Musculoskeletal: No acute or significant osseous findings. There is a thoracolumbar scoliosis and an anterolisthesis at L5-S1, likely secondary to chronic facet disease. Previous proximal right femoral ORIF. IMPRESSION: 1. Interval development of multifocal hepatic metastatic disease. 2. Potential progression of proximal small bowel tumor with increased soft tissue density surrounding the metallic  biliary stent. No evidence of resulting bowel or biliary obstruction (persistent pneumobilia). 3. Stable pancreatic atrophy and diffuse ductal dilatation. 4. Resolution of right pleural effusion. Electronically Signed   By: Richardean Sale M.D.   On: 11/17/2016 14:21    Labs:  CBC:  Recent Labs  09/17/16 1307 10/19/16 1137 11/15/16 0904 11/29/16 0947  WBC 6.4 8.1 9.0 10.1  HGB 10.3* 10.4* 10.6* 10.5*  HCT 31.0* 32.6* 32.1* 32.4*  PLT 289.0 250 343 366    COAGS:  Recent Labs  05/01/16 0221 06/01/16 0823  INR 1.18 1.46  APTT  --  36    BMP:  Recent Labs  06/05/16 0551 06/06/16 0520 06/07/16 0616 07/19/16 1523 10/19/16 1137 11/15/16 0904 11/19/16 1131  NA 136 135 134* 138 139 139 139  K 3.9 3.8 3.5 4.4 4.8 4.5 4.8  CL 103 98* 99* 104 106  --   --   CO2 27 29 29 29 29 25 27   GLUCOSE 92 100* 111* 97 116* 104 98  BUN 9 12 14  26* 28* 18.5 22.5  CALCIUM 8.9 9.0 8.9 9.9 10.2 10.6* 10.6*  CREATININE 0.65 0.65 0.65 0.81 1.04 0.8 0.9  GFRNONAA >60 >60 >60  --  >60  --   --   GFRAA >60 >60 >60  --  >60  --   --     LIVER FUNCTION TESTS:  Recent Labs  07/19/16 1523 10/19/16 1137 11/15/16 0904 11/19/16 1131  BILITOT 0.3 0.6 0.42 0.42  AST  29 39 44* 47*  ALT 16 24 30 30   ALKPHOS 118* 134* 197* 203*  PROT 7.5 7.5 7.5 7.4  ALBUMIN 3.5 3.7 3.2* 3.3*    TUMOR MARKERS:  Recent Labs  04/30/16 0242  CA199 91*    Assessment and Plan: [name] is a 83 y.o. male with past medical history of anemia, arthritis, asthma, HTN, CAD s/p CABG, HLD, MI who presents with recent diagnosis of small bowel carcinoma.  IR consulted for Port-A-Cath placement.  Patient presents for procedure today.  He has been NPO.  He does not take blood thinners.  He has been in his usual state of health.  Risks and benefits discussed with the patient including, but not limited to bleeding, infection, pneumothorax, or fibrin sheath development and need for additional procedures. All of  the patient's questions were answered, patient is agreeable to proceed. Consent signed and in chart.  Thank you for this interesting consult.  I greatly enjoyed meeting Eligio A Mick and look forward to participating in their care.  A copy of this report was sent to the requesting provider on this date.  Electronically Signed: Docia Barrier 11/29/2016, 10:34 AM   I spent a total of  30 Minutes   in face to face in clinical consultation, greater than 50% of which was counseling/coordinating care for small bowel carcinoma.

## 2016-11-29 NOTE — Procedures (Signed)
Placement of right jugular portacath.  Tip at SVC/RA junction.  Minimal blood loss and no immediate complication.  See full report in Imaging.

## 2016-11-29 NOTE — Progress Notes (Signed)
Asst up to void w walker. A little unsteady, but denies dizziness

## 2016-11-29 NOTE — Discharge Instructions (Signed)
Implanted [facility] Guide An implanted port is a type of central line that is placed under the skin. Central lines are used to provide IV access when treatment or nutrition needs to be given through a persons veins. Implanted ports are used for long-term IV access. An implanted port may be placed because:  You need IV medicine that would be irritating to the small veins in your hands or arms.  You need long-term IV medicines, such as antibiotics.  You need IV nutrition for a long period.  You need frequent blood draws for lab tests.  You need dialysis. Implanted ports are usually placed in the chest area, but they can also be placed in the upper arm, the abdomen, or the leg. An implanted port has two main parts:  Reservoir. The reservoir is round and will appear as a small, raised area under your skin. The reservoir is the part where a needle is inserted to give medicines or draw blood.  Catheter. The catheter is a thin, flexible tube that extends from the reservoir. The catheter is placed into a large vein. Medicine that is inserted into the reservoir goes into the catheter and then into the vein. How will I care for my incision site? May shower tomorrow and remove dressing How is my port accessed? Special steps must be taken to access the port:  Before the port is accessed, a numbing cream can be placed on the skin. This helps numb the skin over the port site.  Your health care provider uses a sterile technique to access the port.  Your health care provider must put on a mask and sterile gloves.  The skin over your port is cleaned carefully with an antiseptic and allowed to dry.  The port is gently pinched between sterile gloves, and a needle is inserted into the port.  Only "non-coring" port needles should be used to access the port. Once the port is accessed, a blood return should be checked. This helps ensure that the port is in the vein and is not clogged.  If your port needs  to remain accessed for a constant infusion, a clear (transparent) bandage will be placed over the needle site. The bandage and needle will need to be changed every week, or as directed by your health care provider.  Keep the bandage covering the needle clean and dry. Do not get it wet. Follow your health care providers instructions on how to take a shower or bath while the port is accessed.  If your port does not need to stay accessed, no bandage is needed over the port. What is flushing? Flushing helps keep the port from getting clogged. Follow your health care providers instructions on how and when to flush the port. Ports are usually flushed with saline solution or a medicine called heparin. The need for flushing will depend on how the port is used.  If the port is used for intermittent medicines or blood draws, the port will need to be flushed:  After medicines have been given.  After blood has been drawn.  As part of routine maintenance.  If a constant infusion is running, the port may not need to be flushed. How long will my port stay implanted? The port can stay in for as long as your health care provider thinks it is needed. When it is time for the port to come out, surgery will be done to remove it. The procedure is similar to the one performed when the port  was put in. When should I seek immediate medical care? When you have an implanted port, you should seek immediate medical care if:  You notice a bad smell coming from the incision site.  You have swelling, redness, or drainage at the incision site.  You have more swelling or pain at the port site or the surrounding area.  You have a fever that is not controlled with medicine. This information is not intended to replace advice given to you by your health care provider. Make sure you discuss any questions you have with your health care provider. Document Released: 08/16/2005 Document Revised: 01/22/2016 Document Reviewed:  04/23/2013 Elsevier Interactive Patient Education  2017 [place] Insertion, Care After This sheet gives you information about how to care for yourself after your procedure. Your health care provider may also give you more specific instructions. If you have problems or questions, contact your health care provider. What can I expect after the procedure? After your procedure, it is common to have:  Discomfort at the port insertion site.  Bruising on the skin over the port. This should improve over 3-4 days. Follow these instructions at home: [facility] care   After your port is placed, you will get a manufacturer's information card. The card has information about your port. Keep this card with you at all times.  Take care of the port as told by your health care provider. Ask your health care provider if you or a family member can get training for taking care of the port at home. A home health care nurse may also take care of the port.  Make sure to remember what type of port you have. Incision care   Follow instructions from your health care provider about how to take care of your port insertion site. Make sure you:  Wash your hands with soap and water before you change your bandage (dressing). If soap and water are not available, use hand sanitizer.  Change your dressing as told by your health care provider.  Leave stitches (sutures), skin glue, or adhesive strips in place. These skin closures may need to stay in place for 2 weeks or longer. If adhesive strip edges start to loosen and curl up, you may trim the loose edges. Do not remove adhesive strips completely unless your health care provider tells you to do that.  Check your port insertion site every day for signs of infection. Check for:  More redness, swelling, or pain.  More fluid or blood.  Warmth.  Pus or a bad smell. General instructions   Do not take baths, swim, or use a hot tub until your health care  provider approves.  Do not lift anything that is heavier than 10 lb (4.5 kg) for a week, or as told by your health care provider.  Ask your health care provider when it is okay to:  Return to work or school.  Resume usual physical activities or sports.  Do not drive for 24 hours if you were given a medicine to help you relax (sedative).  Take over-the-counter and prescription medicines only as told by your health care provider.  Wear a medical alert bracelet in case of an emergency. This will tell any health care providers that you have a port.  Keep all follow-up visits as told by your health care provider. This is important. Contact a health care provider if:  You cannot flush your port with saline as directed, or you cannot draw blood from the  port.  You have a fever or chills.  You have more redness, swelling, or pain around your port insertion site.  You have more fluid or blood coming from your port insertion site.  Your port insertion site feels warm to the touch.  You have pus or a bad smell coming from the port insertion site. Get help right away if:  You have chest pain or shortness of breath.  You have bleeding from your port that you cannot control. Summary  Take care of the port as told by your health care provider.  Change your dressing as told by your health care provider.  Keep all follow-up visits as told by your health care provider. This information is not intended to replace advice given to you by your health care provider. Make sure you discuss any questions you have with your health care provider. Document Released: 06/06/2013 Document Revised: 07/07/2016 Document Reviewed: 07/07/2016 Elsevier Interactive Patient Education  [address].   Moderate Conscious Sedation, Adult, Care After These instructions provide you with information about caring for yourself after your procedure. Your health care provider may also give you more specific  instructions. Your treatment has been planned according to current medical practices, but problems sometimes occur. Call your health care provider if you have any problems or questions after your procedure. What can I expect after the procedure? After your procedure, it is common:  To feel sleepy for several hours.  To feel clumsy and have poor balance for several hours.  To have poor judgment for several hours.  To vomit if you eat too soon. Follow these instructions at home: For at least 24 hours after the procedure:    Do not:  Participate in activities where you could fall or become injured.  Drive.  Use heavy machinery.  Drink alcohol.  Take sleeping pills or medicines that cause drowsiness.  Make important decisions or sign legal documents.  Take care of children on your own.  Rest. Eating and drinking   Follow the diet recommended by your health care provider.  If you vomit:  Drink water, juice, or soup when you can drink without vomiting.  Make sure you have little or no nausea before eating solid foods. General instructions   Have a responsible adult stay with you until you are awake and alert.  Take over-the-counter and prescription medicines only as told by your health care provider.  If you smoke, do not smoke without supervision.  Keep all follow-up visits as told by your health care provider. This is important. Contact a health care provider if:  You keep feeling nauseous or you keep vomiting.  You feel light-headed.  You develop a rash.  You have a fever. Get help right away if:  You have trouble breathing. This information is not intended to replace advice given to you by your health care provider. Make sure you discuss any questions you have with your health care provider. Document Released: 06/06/2013 Document Revised: 01/19/2016 Document Reviewed: 12/06/2015 Elsevier Interactive Patient Education  2017 [employer].

## 2016-12-02 ENCOUNTER — Other Ambulatory Visit (HOSPITAL_BASED_OUTPATIENT_CLINIC_OR_DEPARTMENT_OTHER): Payer: Medicare Other

## 2016-12-02 ENCOUNTER — Ambulatory Visit (HOSPITAL_BASED_OUTPATIENT_CLINIC_OR_DEPARTMENT_OTHER): Payer: Medicare Other

## 2016-12-02 ENCOUNTER — Other Ambulatory Visit: Payer: Self-pay | Admitting: Oncology

## 2016-12-02 VITALS — BP 143/75 | HR 71 | Temp 98.4°F | Resp 17

## 2016-12-02 DIAGNOSIS — C179 Malignant neoplasm of small intestine, unspecified: Secondary | ICD-10-CM

## 2016-12-02 DIAGNOSIS — Z5111 Encounter for antineoplastic chemotherapy: Secondary | ICD-10-CM | POA: Diagnosis not present

## 2016-12-02 DIAGNOSIS — Z95828 Presence of other vascular implants and grafts: Secondary | ICD-10-CM | POA: Insufficient documentation

## 2016-12-02 LAB — CBC WITH DIFFERENTIAL/PLATELET
BASO%: 0.6 % (ref 0.0–2.0)
BASOS ABS: 0.1 10*3/uL (ref 0.0–0.1)
EOS ABS: 0.1 10*3/uL (ref 0.0–0.5)
EOS%: 0.9 % (ref 0.0–7.0)
HEMATOCRIT: 30.4 % — AB (ref 38.4–49.9)
HEMOGLOBIN: 10.1 g/dL — AB (ref 13.0–17.1)
LYMPH#: 1.3 10*3/uL (ref 0.9–3.3)
LYMPH%: 12.9 % — ABNORMAL LOW (ref 14.0–49.0)
MCH: 29.6 pg (ref 27.2–33.4)
MCHC: 33.2 g/dL (ref 32.0–36.0)
MCV: 89.2 fL (ref 79.3–98.0)
MONO#: 0.8 10*3/uL (ref 0.1–0.9)
MONO%: 7.7 % (ref 0.0–14.0)
NEUT#: 8 10*3/uL — ABNORMAL HIGH (ref 1.5–6.5)
NEUT%: 77.9 % — ABNORMAL HIGH (ref 39.0–75.0)
Platelets: 344 10*3/uL (ref 140–400)
RBC: 3.41 10*6/uL — ABNORMAL LOW (ref 4.20–5.82)
RDW: 15.5 % — AB (ref 11.0–14.6)
WBC: 10.3 10*3/uL (ref 4.0–10.3)

## 2016-12-02 LAB — COMPREHENSIVE METABOLIC PANEL
ALBUMIN: 3.1 g/dL — AB (ref 3.5–5.0)
ALK PHOS: 203 U/L — AB (ref 40–150)
ALT: 30 U/L (ref 0–55)
AST: 55 U/L — ABNORMAL HIGH (ref 5–34)
Anion Gap: 8 mEq/L (ref 3–11)
BUN: 16.5 mg/dL (ref 7.0–26.0)
CALCIUM: 10.6 mg/dL — AB (ref 8.4–10.4)
CO2: 26 mEq/L (ref 22–29)
Chloride: 104 mEq/L (ref 98–109)
Creatinine: 0.8 mg/dL (ref 0.7–1.3)
EGFR: 79 mL/min/{1.73_m2} — AB (ref >90)
GLUCOSE: 122 mg/dL (ref 70–140)
POTASSIUM: 4.4 meq/L (ref 3.5–5.1)
SODIUM: 137 meq/L (ref 136–145)
Total Bilirubin: 0.34 mg/dL (ref 0.20–1.20)
Total Protein: 7.1 g/dL (ref 6.4–8.3)

## 2016-12-02 MED ORDER — LEUCOVORIN CALCIUM INJECTION 350 MG
400.0000 mg/m2 | Freq: Once | INTRAVENOUS | Status: AC
  Administered 2016-12-02: 752 mg via INTRAVENOUS
  Filled 2016-12-02: qty 37.6

## 2016-12-02 MED ORDER — SODIUM CHLORIDE 0.9% FLUSH
10.0000 mL | Freq: Once | INTRAVENOUS | Status: AC
  Administered 2016-12-02: 10 mL
  Filled 2016-12-02: qty 10

## 2016-12-02 MED ORDER — SODIUM CHLORIDE 0.9 % IV SOLN
2400.0000 mg/m2 | INTRAVENOUS | Status: DC
  Administered 2016-12-02: 4500 mg via INTRAVENOUS
  Filled 2016-12-02: qty 90

## 2016-12-02 MED ORDER — DEXTROSE 5 % IV SOLN
Freq: Once | INTRAVENOUS | Status: AC
  Administered 2016-12-02: 13:00:00 via INTRAVENOUS

## 2016-12-02 MED ORDER — PALONOSETRON HCL INJECTION 0.25 MG/5ML
0.2500 mg | Freq: Once | INTRAVENOUS | Status: AC
  Administered 2016-12-02: 0.25 mg via INTRAVENOUS

## 2016-12-02 MED ORDER — PALONOSETRON HCL INJECTION 0.25 MG/5ML
INTRAVENOUS | Status: AC
  Filled 2016-12-02: qty 5

## 2016-12-02 MED ORDER — DEXAMETHASONE SODIUM PHOSPHATE 10 MG/ML IJ SOLN
INTRAMUSCULAR | Status: AC
  Filled 2016-12-02: qty 1

## 2016-12-02 MED ORDER — DEXAMETHASONE SODIUM PHOSPHATE 10 MG/ML IJ SOLN
10.0000 mg | Freq: Once | INTRAMUSCULAR | Status: AC
  Administered 2016-12-02: 10 mg via INTRAVENOUS

## 2016-12-02 MED ORDER — SODIUM CHLORIDE 0.9 % IV SOLN: 10.0000 mg | Freq: Once | INTRAVENOUS | Status: DC

## 2016-12-02 MED ORDER — OXALIPLATIN CHEMO INJECTION 100 MG/20ML
85.0000 mg/m2 | Freq: Once | INTRAVENOUS | Status: AC
  Administered 2016-12-02: 160 mg via INTRAVENOUS
  Filled 2016-12-02: qty 32

## 2016-12-02 NOTE — Patient Instructions (Addendum)
[facility] Discharge Instructions for Patients Receiving Chemotherapy  Today you received the following chemotherapy agents Oxaliplatin, Leucovorin and Adrucil   To help prevent nausea and vomiting after your treatment, we encourage you to take your nausea medication as directed. No Zofran for 3 days. Take Compazine instead.    If you develop nausea and vomiting that is not controlled by your nausea medication, call the clinic.   BELOW ARE SYMPTOMS THAT SHOULD BE REPORTED IMMEDIATELY:  *FEVER GREATER THAN 100.5 F  *CHILLS WITH OR WITHOUT FEVER  NAUSEA AND VOMITING THAT IS NOT CONTROLLED WITH YOUR NAUSEA MEDICATION  *UNUSUAL SHORTNESS OF BREATH  *UNUSUAL BRUISING OR BLEEDING  TENDERNESS IN MOUTH AND THROAT WITH OR WITHOUT PRESENCE OF ULCERS  *URINARY PROBLEMS  *BOWEL PROBLEMS  UNUSUAL RASH Items with * indicate a potential emergency and should be followed up as soon as possible.  Feel free to call the clinic you have any questions or concerns. The clinic phone number is [phone].  Please show the [facility] at check-in to the Emergency Department and triage nurse.  Oxaliplatin Injection What is this medicine? OXALIPLATIN (ox AL i PLA tin) is a chemotherapy drug. It targets fast dividing cells, like cancer cells, and causes these cells to die. This medicine is used to treat cancers of the colon and rectum, and many other cancers. This medicine may be used for other purposes; ask your health care provider or pharmacist if you have questions. COMMON BRAND NAME(S): Eloxatin What should I tell my health care provider before I take this medicine? They need to know if you have any of these conditions: -kidney disease -an unusual or allergic reaction to oxaliplatin, other chemotherapy, other medicines, foods, dyes, or preservatives -pregnant or trying to get pregnant -breast-feeding How should I use this medicine? This drug is given as an infusion into  a vein. It is administered in a hospital or clinic by a specially trained health care professional. Talk to your pediatrician regarding the use of this medicine in children. Special care may be needed. Overdosage: If you think you have taken too much of this medicine contact a poison control center or emergency room at once. NOTE: This medicine is only for you. Do not share this medicine with others. What if I miss a dose? It is important not to miss a dose. Call your doctor or health care professional if you are unable to keep an appointment. What may interact with this medicine? -medicines to increase blood counts like filgrastim, pegfilgrastim, sargramostim -probenecid -some antibiotics like amikacin, gentamicin, neomycin, polymyxin B, streptomycin, tobramycin -zalcitabine Talk to your doctor or health care professional before taking any of these medicines: -acetaminophen -aspirin -ibuprofen -ketoprofen -naproxen This list may not describe all possible interactions. Give your health care provider a list of all the medicines, herbs, non-prescription drugs, or dietary supplements you use. Also tell them if you smoke, drink alcohol, or use illegal drugs. Some items may interact with your medicine. What should I watch for while using this medicine? Your condition will be monitored carefully while you are receiving this medicine. You will need important blood work done while you are taking this medicine. This medicine can make you more sensitive to cold. Do not drink cold drinks or use ice. Cover exposed skin before coming in contact with cold temperatures or cold objects. When out in cold weather wear warm clothing and cover your mouth and nose to warm the air that goes into your lungs. Tell  your doctor if you get sensitive to the cold. This drug may make you feel generally unwell. This is not uncommon, as chemotherapy can affect healthy cells as well as cancer cells. Report any side effects.  Continue your course of treatment even though you feel ill unless your doctor tells you to stop. In some cases, you may be given additional medicines to help with side effects. Follow all directions for their use. Call your doctor or health care professional for advice if you get a fever, chills or sore throat, or other symptoms of a cold or flu. Do not treat yourself. This drug decreases your body's ability to fight infections. Try to avoid being around people who are sick. This medicine may increase your risk to bruise or bleed. Call your doctor or health care professional if you notice any unusual bleeding. Be careful brushing and flossing your teeth or using a toothpick because you may get an infection or bleed more easily. If you have any dental work done, tell your dentist you are receiving this medicine. Avoid taking products that contain aspirin, acetaminophen, ibuprofen, naproxen, or ketoprofen unless instructed by your doctor. These medicines may hide a fever. Do not become pregnant while taking this medicine. Women should inform their doctor if they wish to become pregnant or think they might be pregnant. There is a potential for serious side effects to an unborn child. Talk to your health care professional or pharmacist for more information. Do not breast-feed an infant while taking this medicine. Call your doctor or health care professional if you get diarrhea. Do not treat yourself. What side effects may I notice from receiving this medicine? Side effects that you should report to your doctor or health care professional as soon as possible: -allergic reactions like skin rash, itching or hives, swelling of the face, lips, or tongue -low blood counts - This drug may decrease the number of white blood cells, red blood cells and platelets. You may be at increased risk for infections and bleeding. -signs of infection - fever or chills, cough, sore throat, pain or difficulty passing urine -signs  of decreased platelets or bleeding - bruising, pinpoint red spots on the skin, black, tarry stools, nosebleeds -signs of decreased red blood cells - unusually weak or tired, fainting spells, lightheadedness -breathing problems -chest pain, pressure -cough -diarrhea -jaw tightness -mouth sores -nausea and vomiting -pain, swelling, redness or irritation at the injection site -pain, tingling, numbness in the hands or feet -problems with balance, talking, walking -redness, blistering, peeling or loosening of the skin, including inside the mouth -trouble passing urine or change in the amount of urine Side effects that usually do not require medical attention (report to your doctor or health care professional if they continue or are bothersome): -changes in vision -constipation -hair loss -loss of appetite -metallic taste in the mouth or changes in taste -stomach pain This list may not describe all possible side effects. Call your doctor for medical advice about side effects. You may report side effects to FDA at 1-800-FDA-1088. Where should I keep my medicine? This drug is given in a hospital or clinic and will not be stored at home. NOTE: This sheet is a summary. It may not cover all possible information. If you have questions about this medicine, talk to your doctor, pharmacist, or health care provider.  2018 [employer]/Gold Standard (2008-03-12 17:22:47)  Leucovorin injection What is this medicine? LEUCOVORIN (loo koe VOR in) is used to prevent or treat the harmful effects  of some medicines. This medicine is used to treat anemia caused by a low amount of folic acid in the body. It is also used with 5-fluorouracil (5-FU) to treat colon cancer. This medicine may be used for other purposes; ask your health care provider or pharmacist if you have questions. What should I tell my health care provider before I take this medicine? They need to know if you have any of these conditions: -anemia  from low levels of vitamin B-12 in the blood -an unusual or allergic reaction to leucovorin, folic acid, other medicines, foods, dyes, or preservatives -pregnant or trying to get pregnant -breast-feeding How should I use this medicine? This medicine is for injection into a muscle or into a vein. It is given by a health care professional in a hospital or clinic setting. Talk to your pediatrician regarding the use of this medicine in children. Special care may be needed. Overdosage: If you think you have taken too much of this medicine contact a poison control center or emergency room at once. NOTE: This medicine is only for you. Do not share this medicine with others. What if I miss a dose? This does not apply. What may interact with this medicine? -capecitabine -fluorouracil -phenobarbital -phenytoin -primidone -trimethoprim-sulfamethoxazole This list may not describe all possible interactions. Give your health care provider a list of all the medicines, herbs, non-prescription drugs, or dietary supplements you use. Also tell them if you smoke, drink alcohol, or use illegal drugs. Some items may interact with your medicine. What should I watch for while using this medicine? Your condition will be monitored carefully while you are receiving this medicine. This medicine may increase the side effects of 5-fluorouracil, 5-FU. Tell your doctor or health care professional if you have diarrhea or mouth sores that do not get better or that get worse. What side effects may I notice from receiving this medicine? Side effects that you should report to your doctor or health care professional as soon as possible: -allergic reactions like skin rash, itching or hives, swelling of the face, lips, or tongue -breathing problems -fever, infection -mouth sores -unusual bleeding or bruising -unusually weak or tired Side effects that usually do not require medical attention (report to your doctor or health  care professional if they continue or are bothersome): -constipation or diarrhea -loss of appetite -nausea, vomiting This list may not describe all possible side effects. Call your doctor for medical advice about side effects. You may report side effects to FDA at 1-800-FDA-1088. Where should I keep my medicine? This drug is given in a hospital or clinic and will not be stored at home. NOTE: This sheet is a summary. It may not cover all possible information. If you have questions about this medicine, talk to your doctor, pharmacist, or health care provider.  2018 [employer]/Gold Standard (2008-02-20 16:50:29)  Fluorouracil, 5-FU injection What is this medicine? FLUOROURACIL, 5-FU (flure oh YOOR a sil) is a chemotherapy drug. It slows the growth of cancer cells. This medicine is used to treat many types of cancer like breast cancer, colon or rectal cancer, pancreatic cancer, and stomach cancer. This medicine may be used for other purposes; ask your health care provider or pharmacist if you have questions. COMMON BRAND NAME(S): Adrucil What should I tell my health care provider before I take this medicine? They need to know if you have any of these conditions: -blood disorders -dihydropyrimidine dehydrogenase (DPD) deficiency -infection (especially a virus infection such as chickenpox, cold sores, or herpes) -  kidney disease -liver disease -malnourished, poor nutrition -recent or ongoing radiation therapy -an unusual or allergic reaction to fluorouracil, other chemotherapy, other medicines, foods, dyes, or preservatives -pregnant or trying to get pregnant -breast-feeding How should I use this medicine? This drug is given as an infusion or injection into a vein. It is administered in a hospital or clinic by a specially trained health care professional. Talk to your pediatrician regarding the use of this medicine in children. Special care may be needed. Overdosage: If you think you have taken  too much of this medicine contact a poison control center or emergency room at once. NOTE: This medicine is only for you. Do not share this medicine with others. What if I miss a dose? It is important not to miss your dose. Call your doctor or health care professional if you are unable to keep an appointment. What may interact with this medicine? -allopurinol -cimetidine -dapsone -digoxin -hydroxyurea -leucovorin -levamisole -medicines for seizures like ethotoin, fosphenytoin, phenytoin -medicines to increase blood counts like filgrastim, pegfilgrastim, sargramostim -medicines that treat or prevent blood clots like warfarin, enoxaparin, and dalteparin -methotrexate -metronidazole -pyrimethamine -some other chemotherapy drugs like busulfan, cisplatin, estramustine, vinblastine -trimethoprim -trimetrexate -vaccines Talk to your doctor or health care professional before taking any of these medicines: -acetaminophen -aspirin -ibuprofen -ketoprofen -naproxen This list may not describe all possible interactions. Give your health care provider a list of all the medicines, herbs, non-prescription drugs, or dietary supplements you use. Also tell them if you smoke, drink alcohol, or use illegal drugs. Some items may interact with your medicine. What should I watch for while using this medicine? Visit your doctor for checks on your progress. This drug may make you feel generally unwell. This is not uncommon, as chemotherapy can affect healthy cells as well as cancer cells. Report any side effects. Continue your course of treatment even though you feel ill unless your doctor tells you to stop. In some cases, you may be given additional medicines to help with side effects. Follow all directions for their use. Call your doctor or health care professional for advice if you get a fever, chills or sore throat, or other symptoms of a cold or flu. Do not treat yourself. This drug decreases your body's  ability to fight infections. Try to avoid being around people who are sick. This medicine may increase your risk to bruise or bleed. Call your doctor or health care professional if you notice any unusual bleeding. Be careful brushing and flossing your teeth or using a toothpick because you may get an infection or bleed more easily. If you have any dental work done, tell your dentist you are receiving this medicine. Avoid taking products that contain aspirin, acetaminophen, ibuprofen, naproxen, or ketoprofen unless instructed by your doctor. These medicines may hide a fever. Do not become pregnant while taking this medicine. Women should inform their doctor if they wish to become pregnant or think they might be pregnant. There is a potential for serious side effects to an unborn child. Talk to your health care professional or pharmacist for more information. Do not breast-feed an infant while taking this medicine. Men should inform their doctor if they wish to father a child. This medicine may lower sperm counts. Do not treat diarrhea with over the counter products. Contact your doctor if you have diarrhea that lasts more than 2 days or if it is severe and watery. This medicine can make you more sensitive to the sun. Keep out of the  sun. If you cannot avoid being in the sun, wear protective clothing and use sunscreen. Do not use sun lamps or tanning beds/booths. What side effects may I notice from receiving this medicine? Side effects that you should report to your doctor or health care professional as soon as possible: -allergic reactions like skin rash, itching or hives, swelling of the face, lips, or tongue -low blood counts - this medicine may decrease the number of white blood cells, red blood cells and platelets. You may be at increased risk for infections and bleeding. -signs of infection - fever or chills, cough, sore throat, pain or difficulty passing urine -signs of decreased platelets or  bleeding - bruising, pinpoint red spots on the skin, black, tarry stools, blood in the urine -signs of decreased red blood cells - unusually weak or tired, fainting spells, lightheadedness -breathing problems -changes in vision -chest pain -mouth sores -nausea and vomiting -pain, swelling, redness at site where injected -pain, tingling, numbness in the hands or feet -redness, swelling, or sores on hands or feet -stomach pain -unusual bleeding Side effects that usually do not require medical attention (report to your doctor or health care professional if they continue or are bothersome): -changes in finger or toe nails -diarrhea -dry or itchy skin -hair loss -headache -loss of appetite -sensitivity of eyes to the light -stomach upset -unusually teary eyes This list may not describe all possible side effects. Call your doctor for medical advice about side effects. You may report side effects to FDA at 1-800-FDA-1088. Where should I keep my medicine? This drug is given in a hospital or clinic and will not be stored at home. NOTE: This sheet is a summary. It may not cover all possible information. If you have questions about this medicine, talk to your doctor, pharmacist, or health care provider.  2018 [employer]/Gold Standard (2007-12-20 13:53:16)

## 2016-12-03 ENCOUNTER — Telehealth: Payer: Self-pay

## 2016-12-03 NOTE — Telephone Encounter (Signed)
s/w pt and he is tired today. He did not sleep well last night. Discussed dexamethasone in treatment. He has not had BM today. He usually takes 2 colace in AM and 2 senna in PM. Instructed him to increase this if no BM tomorrow or add miralax. He mentioned using a hemorrhoid suppository tomorrow and that usually aid with BM. Discussed OK to use tomorrow but not later d/t decreased blood counts from chemotherapy and risk of infection. Encouraged fluid intake, he has drank > a quart but less than 64 oz today.

## 2016-12-03 NOTE — Telephone Encounter (Signed)
-----   Message from Arna Snipe, RN sent at 12/02/2016  4:17 PM EDT ----- Regarding: [name] - Chemo F/U call Contact: [phone] 1 st time Oxaliplatin and Leucovorin.

## 2016-12-04 ENCOUNTER — Ambulatory Visit (HOSPITAL_BASED_OUTPATIENT_CLINIC_OR_DEPARTMENT_OTHER): Payer: Self-pay

## 2016-12-04 ENCOUNTER — Emergency Department (HOSPITAL_COMMUNITY): Payer: Medicare Other

## 2016-12-04 ENCOUNTER — Encounter (HOSPITAL_COMMUNITY): Payer: Self-pay | Admitting: Nurse Practitioner

## 2016-12-04 ENCOUNTER — Emergency Department (HOSPITAL_COMMUNITY)
Admission: EM | Admit: 2016-12-04 | Discharge: 2016-12-04 | Disposition: A | Discharge status: Home / Self Care | Payer: Medicare Other | Attending: Emergency Medicine | Admitting: Emergency Medicine

## 2016-12-04 VITALS — BP 134/65 | HR 87 | Temp 98.1°F | Resp 18

## 2016-12-04 DIAGNOSIS — R509 Fever, unspecified: Secondary | ICD-10-CM | POA: Diagnosis present

## 2016-12-04 DIAGNOSIS — I2510 Atherosclerotic heart disease of native coronary artery without angina pectoris: Secondary | ICD-10-CM | POA: Diagnosis not present

## 2016-12-04 DIAGNOSIS — I110 Hypertensive heart disease with heart failure: Secondary | ICD-10-CM | POA: Diagnosis not present

## 2016-12-04 DIAGNOSIS — I5022 Chronic systolic (congestive) heart failure: Secondary | ICD-10-CM | POA: Diagnosis not present

## 2016-12-04 DIAGNOSIS — Z950 Presence of cardiac pacemaker: Secondary | ICD-10-CM | POA: Insufficient documentation

## 2016-12-04 DIAGNOSIS — Z951 Presence of aortocoronary bypass graft: Secondary | ICD-10-CM | POA: Insufficient documentation

## 2016-12-04 DIAGNOSIS — C179 Malignant neoplasm of small intestine, unspecified: Secondary | ICD-10-CM

## 2016-12-04 DIAGNOSIS — Z7982 Long term (current) use of aspirin: Secondary | ICD-10-CM | POA: Diagnosis not present

## 2016-12-04 DIAGNOSIS — I252 Old myocardial infarction: Secondary | ICD-10-CM | POA: Insufficient documentation

## 2016-12-04 DIAGNOSIS — Z79899 Other long term (current) drug therapy: Secondary | ICD-10-CM | POA: Insufficient documentation

## 2016-12-04 DIAGNOSIS — Z8546 Personal history of malignant neoplasm of prostate: Secondary | ICD-10-CM | POA: Diagnosis not present

## 2016-12-04 DIAGNOSIS — J45909 Unspecified asthma, uncomplicated: Secondary | ICD-10-CM | POA: Insufficient documentation

## 2016-12-04 LAB — URINALYSIS, ROUTINE W REFLEX MICROSCOPIC
Bacteria, UA: NONE SEEN
Bilirubin Urine: NEGATIVE
Glucose, UA: NEGATIVE mg/dL
Hgb urine dipstick: NEGATIVE
Ketones, ur: NEGATIVE mg/dL
Leukocytes, UA: NEGATIVE
Nitrite: NEGATIVE
PH: 7 (ref 5.0–8.0)
Protein, ur: NEGATIVE mg/dL
SPECIFIC GRAVITY, URINE: 1.016 (ref 1.005–1.030)
SQUAMOUS EPITHELIAL / LPF: NONE SEEN

## 2016-12-04 LAB — CBC WITH DIFFERENTIAL/PLATELET
Basophils Absolute: 0 10*3/uL (ref 0.0–0.1)
Basophils Relative: 0 %
EOS ABS: 0 10*3/uL (ref 0.0–0.7)
EOS PCT: 0 %
HCT: 29.7 % — ABNORMAL LOW (ref 39.0–52.0)
Hemoglobin: 9.3 g/dL — ABNORMAL LOW (ref 13.0–17.0)
LYMPHS ABS: 1.2 10*3/uL (ref 0.7–4.0)
Lymphocytes Relative: 11 %
MCH: 27.8 pg (ref 26.0–34.0)
MCHC: 31.3 g/dL (ref 30.0–36.0)
MCV: 88.7 fL (ref 78.0–100.0)
MONO ABS: 0.4 10*3/uL (ref 0.1–1.0)
MONOS PCT: 3 %
Neutro Abs: 9.6 10*3/uL — ABNORMAL HIGH (ref 1.7–7.7)
Neutrophils Relative %: 86 %
PLATELETS: 325 10*3/uL (ref 150–400)
RBC: 3.35 MIL/uL — ABNORMAL LOW (ref 4.22–5.81)
RDW: 14.6 % (ref 11.5–15.5)
WBC: 11.2 10*3/uL — ABNORMAL HIGH (ref 4.0–10.5)

## 2016-12-04 LAB — COMPREHENSIVE METABOLIC PANEL
ALK PHOS: 182 U/L — AB (ref 38–126)
ALT: 35 U/L (ref 17–63)
AST: 74 U/L — AB (ref 15–41)
Albumin: 3.1 g/dL — ABNORMAL LOW (ref 3.5–5.0)
Anion gap: 6 (ref 5–15)
BUN: 19 mg/dL (ref 6–20)
CHLORIDE: 101 mmol/L (ref 101–111)
CO2: 28 mmol/L (ref 22–32)
CREATININE: 0.81 mg/dL (ref 0.61–1.24)
Calcium: 9.6 mg/dL (ref 8.9–10.3)
GFR calc Af Amer: >60 mL/min (ref >60)
GFR calc non Af Amer: >60 mL/min (ref >60)
Glucose, Bld: 107 mg/dL — ABNORMAL HIGH (ref 65–99)
Potassium: 4.9 mmol/L (ref 3.5–5.1)
SODIUM: 135 mmol/L (ref 135–145)
Total Bilirubin: 0.4 mg/dL (ref 0.3–1.2)
Total Protein: 7 g/dL (ref 6.5–8.1)

## 2016-12-04 LAB — INFLUENZA PANEL BY PCR (TYPE A & B)
INFLBPCR: NEGATIVE
Influenza A By PCR: NEGATIVE

## 2016-12-04 LAB — I-STAT CG4 LACTIC ACID, ED: LACTIC ACID, VENOUS: 0.95 mmol/L (ref 0.5–1.9)

## 2016-12-04 MED ORDER — GI COCKTAIL ~~LOC~~
30.0000 mL | Freq: Once | ORAL | Status: AC
  Administered 2016-12-04: 30 mL via ORAL
  Filled 2016-12-04: qty 30

## 2016-12-04 MED ORDER — HEPARIN SOD (PORK) LOCK FLUSH 100 UNIT/ML IV SOLN
500.0000 [IU] | Freq: Once | INTRAVENOUS | Status: AC | PRN
  Administered 2016-12-04: 500 [IU]
  Filled 2016-12-04: qty 5

## 2016-12-04 MED ORDER — SODIUM CHLORIDE 0.9% FLUSH
10.0000 mL | INTRAVENOUS | Status: DC | PRN
  Administered 2016-12-04: 10 mL
  Filled 2016-12-04: qty 10

## 2016-12-04 NOTE — ED Provider Notes (Signed)
[facility] DEPT Provider Note   CSN: [id number] Arrival date & time: 12/04/16  1814     History   Chief Complaint Chief Complaint  Patient presents with  . Fever  . Chemotherapy    HPI [name] is a 83 y.o. male.Patient felt hot this afternoon took his temperature was 100.7. No treatment prior to coming here. Patient asymptomatic except feels generally weak. Denies headache denies abdominal pain denies shortness of breath denies urinary symptoms. He's had cough which has been chronic for several weeks, somewhat improving. Patient had chemotherapy 2 days ago. No treatment prior to coming here nothing makes symptoms better or worse HPI  Past Medical History:  Diagnosis Date  . Anemia 2014   was on iron pills and then was able to come off  . Arthritis    "all over"  . Asthma       . Atrial flutter ([place])    a. diagnosed 03/2015  . Bacteremia 03/2016  . Complication of anesthesia    B/P dropped low and had to stay in recovery longer  . Coronary artery disease    a. s/p CABG 1986 b. redo CABG 1993 c. PTCA 2008 d. DES 2010  . Depression       . Diverticulosis   . GERD (gastroesophageal reflux disease)       . Hepatitis    hepatitis- long time ago, occupational contamination   . Hiatal hernia   . History of kidney stones   . Hyperlipemia   . Hypertension       . Insomnia       . Myocardial infarction LAST 2010   MI 1986 WITH CABG DONE  . On home oxygen therapy    "2L; just at night" (01/31/2015)  . Peripheral neuropathy (HCC)    TOES BOTH FEET  . Peripheral vascular disease (HCC)    thrombus- in leg- many yrs. ago- ?R- coumadin x1 mth  . Pneumonia 1960's AND FALL 2017  . Presence of permanent cardiac pacemaker   . Prostate cancer [facility])    radiation therapy- 2002  . Systolic CHF ([place])       . VT (ventricular tachycardia) ([place]) 06/2012   a. in the setting of STEMI b. on amio during hospitalization    Patient Active Problem List   Diagnosis Date Noted    . Port catheter in place 12/02/2016  . Goals of care, counseling/discussion 11/19/2016  . 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 Acute on chronic respiratory failure with hypoxemia ([place]) 06/05/2016  . Loculated pleural effusion   . Pleural effusion on right   . Cough 06/02/2016  . HCAP (healthcare-associated pneumonia) 06/02/2016  . Chronic anemia   . Chronic systolic congestive heart failure ([place])   . Chronic bronchitis ([place])   . Common bile duct mass   . Fever 05/26/2016  . Pyrexia   . Biliary drain displacement   . 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 Cholangitis   . Peri-ampullary neoplasm   . Malnutrition of moderate degree 04/29/2016  . Common bile duct obstruction   . Elevated liver function tests   . Bacteremia due to Gram-negative bacteria 04/27/2016  . BPH (benign prostatic hyperplasia)   . Asthma, mild intermittent   . Insomnia 02/18/2016  . SOB (shortness of breath)   . Pacemaker 12/25/2015  . Atrial flutter, RVR 04/10/2015  . Unstable angina ([place]) 04/10/2015  . Coronary artery disease involving native coronary artery of native heart without angina pectoris 04/10/2015  . Urinary  retention due to benign prostatic hyperplasia   . Systolic CHF ([place]) 67/67/2094  . Depression 09/26/2013  . Intertrochanteric fracture of right hip ([place]) 08/29/2013  . Hypertension   . GERD (gastroesophageal reflux disease)   . COPD (chronic obstructive pulmonary disease) ([place])   . H/O cardiac arrest   . Junctional bradycardia 07/25/2012  . Glucose intolerance (impaired glucose tolerance) 07/19/2012  . Normocytic anemia 07/19/2012  . Acute MI, anterolateral wall ([place]) 07/16/2012  . Prostatitis 07/16/2012  . E. coli UTI (urinary tract infection) 07/16/2012  . Elevated transaminase level 07/16/2012  . Foot drop, right 07/28/2011  . LBBB (left bundle branch block) 03/29/2011  . Hx of CABG and re-do CABG  03/29/2011  . Ischemic heart disease   . Myocardial infarction   . Acute systolic  congestive heart failure, NYHA class 2-EF 40-45%   . Chronic respiratory failure ([place])   . Chronic asthma   . Peripheral neuropathy ([place])   . Dyslipidemia (high LDL; low HDL)   . Essential hypertension 04/04/2007  . PSORIASIS 04/04/2007  . OSTEOARTHRITIS 04/04/2007  . OSTEOPOROSIS 04/04/2007  . Coronary artery disease due to lipid rich plaque 01/05/2007  . HERNIA 01/05/2007    Past Surgical History:  Procedure Laterality Date  . APPENDECTOMY  1943  . BILIARY DRAINAGE  05/14/2016  . CATARACT EXTRACTION W/ INTRAOCULAR LENS  IMPLANT, BILATERAL    . COLONOSCOPY    . CORONARY ARTERY BYPASS GRAFT  1986;  1993   CABG X 5; CABG X 1  . CYSTOSCOPY W/ URETERAL STENT PLACEMENT Right 02/28/2013   Procedure: CYSTOSCOPY WITH RETROGRADE PYELOGRAM/URETERAL STENT PLACEMENT;  Surgeon: Dutch Gray, MD;  Location: [facility];  Service: Urology;  Laterality: Right;  . CYSTOSCOPY WITH URETEROSCOPY AND STENT PLACEMENT Right 03/23/2013   Procedure: RIGHT URETEROSCOPY, LASER LITHO AND STENT PLACEMENT;  Surgeon: Fredricka Bonine, MD;  Location: [facility];  Service: Urology;  Laterality: Right;  . EP IMPLANTABLE DEVICE N/A 04/11/2015   Procedure: Pacemaker Implant;  Surgeon: Will Meredith Leeds, MD;  Location: [facility] CV LAB;  Service: Cardiovascular;  Laterality: N/A;  . ERCP N/A 04/30/2016   Procedure: ENDOSCOPIC RETROGRADE CHOLANGIOPANCREATOGRAPHY (ERCP);  Surgeon: Milus Banister, MD;  Location: [facility];  Service: Endoscopy;  Laterality: N/A;  . ESOPHAGOGASTRODUODENOSCOPY (EGD) WITH PROPOFOL N/A 10/28/2016   Procedure: ESOPHAGOGASTRODUODENOSCOPY (EGD) WITH PROPOFOL;  Surgeon: Milus Banister, MD;  [facility];  Service: Endoscopy;  Laterality: N/A;  . FOOT FUSION Left 2000   heel  . FRACTURE SURGERY     heel- crushed -2002,  (hardware)   . HAMMER TOE SURGERY Right    little toe  . HARDWARE REMOVAL Right 2002   great toe; Dr. Sharol Given  . HOLMIUM LASER APPLICATION Right 03/07/6282   Procedure:  HOLMIUM LASER APPLICATION;  Surgeon: Fredricka Bonine, MD;  [facility];  Service: Urology;  Laterality: Right;  . INGUINAL HERNIA REPAIR Left 01/31/2015   Procedure: OPEN REPAIR RECURRENT LEFT INGUINAL HERNIA;  Surgeon: Fanny Skates, MD;  Location: [facility];  Service: General;  Laterality: Left;  . INSERTION OF MESH Left 01/31/2015   Procedure: INSERTION OF MESH;  Surgeon: Fanny Skates, MD;  Location: [facility];  Service: General;  Laterality: Left;  . INTRAMEDULLARY (IM) NAIL INTERTROCHANTERIC Right 08/29/2013   Procedure: INTRAMEDULLARY (IM) NAIL INTERTROCHANTRIC;  Surgeon: Marianna Payment, MD;  Location: [facility];  Service: Orthopedics;  Laterality: Right;  . IR FLUORO GUIDE PORT INSERTION RIGHT  11/29/2016  . IR GENERIC HISTORICAL  05/01/2016  IR INT EXT BILIARY DRAIN WITH CHOLANGIOGRAM 05/01/2016 Corrie Mckusick, DO [facility]  . IR GENERIC HISTORICAL  06/01/2016   IR BILIARY STENT(S) EXISTING ACCESS INC DILATION CATH EXCHANGE 06/01/2016 Corrie Mckusick, DO [facility]  . IR GENERIC HISTORICAL  05/20/2016   IR RADIOLOGIST EVAL & MGMT 05/20/2016 Greggory Keen, MD [facility]  . IR US GUIDE VASC ACCESS RIGHT  11/29/2016  . JOINT REPLACEMENT     R great toe  . KNEE ARTHROSCOPY Right X 2  . LEFT AND RIGHT HEART CATHETERIZATION WITH CORONARY ANGIOGRAM N/A 07/15/2012   Procedure: LEFT AND RIGHT HEART CATHETERIZATION WITH CORONARY ANGIOGRAM;  Surgeon: Jettie Booze, MD;  Location: [facility] CATH LAB;  Service: Cardiovascular;  Laterality: N/A;  . LUMBAR LAMINECTOMY/DECOMPRESSION MICRODISCECTOMY  08/10/2011   Procedure: LUMBAR LAMINECTOMY/DECOMPRESSION MICRODISCECTOMY;  Surgeon: Cooper Render Pool;  Location: [facility] NEURO ORS;  Service: Neurosurgery;  Laterality: Right;  RIGHT Lumbar five-sacral one LAMINECTOMY, MICRODISCECTOMY  . LUMBAR WOUND DEBRIDEMENT  08/22/2011   Procedure: LUMBAR WOUND DEBRIDEMENT;  Surgeon: Eustace Moore;  Location: [facility]URO ORS;  Service: Neurosurgery;  Laterality: N/A;  Repair of  CSF Leak requiring laminectomy  . PERCUTANEOUS CORONARY STENT INTERVENTION (PCI-S) N/A 07/15/2012   Procedure: PERCUTANEOUS CORONARY STENT INTERVENTION (PCI-S);  Surgeon: Jettie Booze, MD;  Location: [facility] CATH LAB;  Service: Cardiovascular;  Laterality: N/A;  . RHINOPLASTY  [phone]  . RIGHT HIP PINNING   5 YRS AGO   DR SUE  . SQUAMOUS CELL AREA REMOVED FROM LEFT LOWER ARM  DEC 2017 AND MORE REMOVED 10-18-16 AND AREA FROM RIGHT EAR REMOVED IN PAST  . TEE WITHOUT CARDIOVERSION N/A 04/28/2016   Procedure: TRANSESOPHAGEAL ECHOCARDIOGRAM (TEE);  Surgeon: Skeet Latch, MD;  Location[facility] ENDOSCOPY;  Service: Cardiovascular;  Laterality: N/A;  . TONSILLECTOMY  1940  . TRANSURETHRAL RESECTION OF PROSTATE  x2    OB History    No data available       Home Medications    Prior to Admission medications   Medication Sig Start Date End Date Taking? Authorizing Provider  acetaminophen (TYLENOL) 500 MG tablet Take 1,000 mg by mouth every 6 (six) hours as needed for mild pain, moderate pain or fever.    Yes Historical Provider, MD  albuterol (PROVENTIL HFA;VENTOLIN HFA) 108 (90 Base) MCG/ACT inhaler Inhale 2 puffs into the lungs every 6 (six) hours as needed for wheezing or shortness of breath. 12/30/15  Yes Mercy Riding, MD  Amino Acids-Protein Hydrolys (FEEDING SUPPLEMENT, PRO-STAT SUGAR FREE 64,) LIQD Take 30 mLs by mouth 2 (two) times daily. Patient taking differently: Take 30 mLs by mouth daily with lunch.  05/02/16  Yes Sela Hilding, MD  aspirin EC 81 MG EC tablet Take 1 tablet (81 mg total) by mouth daily. Patient taking differently: Take 81 mg by mouth at bedtime.  04/12/15  Yes Luke K Kilroy, PA-C  budesonide-formoterol (SYMBICORT) 80-4.5 MCG/ACT inhaler Inhale 2 puffs into the lungs 2 (two) times daily. 09/12/12  Yes Tanda Rockers, MD  carbamazepine (TEGRETOL) 100 MG chewable tablet Chew 300 mg by mouth 2 (two) times daily.   Yes Historical Provider, MD  docusate sodium (COLACE) 100  MG capsule Take 100 mg by mouth at bedtime.    Yes Historical Provider, MD  feeding supplement, ENSURE ENLIVE, (ENSURE ENLIVE) LIQD Take 237 mLs by mouth 2 (two) times daily between meals. 05/02/16  Yes Sela Hilding, MD  ferrous sulfate 325 (65 FE) MG EC tablet Take 325 mg by mouth 3 (  three) times daily with meals.   Yes Historical Provider, MD  finasteride (PROSCAR) 5 MG tablet Take 5 mg by mouth every morning.    Yes Historical Provider, MD  folic acid (FOLVITE) 488 MCG tablet Take 400 mcg by mouth daily.   Yes Historical Provider, MD  furosemide (LASIX) 40 MG tablet Take 40 mg by mouth daily.   Yes Historical Provider, MD  ipratropium (ATROVENT HFA) 17 MCG/ACT inhaler Inhale 2 puffs into the lungs 2 (two) times daily as needed for wheezing.    Yes Historical Provider, MD  ipratropium (ATROVENT) 0.02 % nebulizer solution Take 2.5 mLs (0.5 mg total) by nebulization 2 (two) times daily as needed for wheezing or shortness of breath. 05/02/16  Yes Sela Hilding, MD  ipratropium-albuterol (DUONEB) 0.5-2.5 (3) MG/3ML SOLN Take 3 mLs by nebulization every 6 (six) hours as needed. Patient taking differently: Take 3 mLs by nebulization every 6 (six) hours as needed (for shortness of breath).  05/31/16  Yes Elsia Nigel Sloop, DO  isosorbide mononitrate (IMDUR) 30 MG 24 hr tablet Take 1 tablet (30 mg total) by mouth daily. 05/02/16  Yes Sela Hilding, MD  lidocaine-prilocaine (EMLA) cream Apply cream to port one hour prior to port access and cover with plastic wrap 11/19/16  Yes Ladell Pier, MD  Melatonin 3 MG TABS Take 3 mg by mouth at bedtime.   Yes Historical Provider, MD  metoprolol succinate (TOPROL-XL) 12.5 mg TB24 24 hr tablet Take 12.5 mg by mouth daily.   Yes Historical Provider, MD  omeprazole (PRILOSEC) 20 MG capsule Take 20 mg by mouth daily.   Yes Historical Provider, MD  OXYGEN Inhale 2 L/min into the lungs at bedtime.   Yes Historical Provider, MD  potassium chloride SA (K-DUR,KLOR-CON)  20 MEQ tablet Take 20 mEq by mouth daily with lunch.    Yes Historical Provider, MD  prochlorperazine (COMPAZINE) 5 MG tablet Take 1 tablet (5 mg total) by mouth every 6 (six) hours as needed for nausea or vomiting. 11/19/16  Yes Ladell Pier, MD  ranitidine (ZANTAC) 150 MG tablet Take 1 tablet (150 mg total) by mouth 2 (two) times daily. Patient taking differently: Take 300 mg by mouth at bedtime.  06/21/16  Yes Elsia Nigel Sloop, DO  senna (SENOKOT) 8.6 MG tablet Take 1 tablet by mouth daily.    Yes Historical Provider, MD  sertraline (ZOLOFT) 100 MG tablet Take 150 mg by mouth every morning.    Yes Historical Provider, MD  tamsulosin (FLOMAX) 0.4 MG CAPS capsule Take 1 capsule (0.4 mg total) by mouth daily. Patient taking differently: Take 0.4 mg by mouth every evening.  09/06/14  Yes [place] N Rumley, DO  traMADol (ULTRAM) 50 MG tablet Take 1 tablet (50 mg total) by mouth every 6 (six) hours as needed. 11/15/16  Yes Ladell Pier, MD  traZODone (DESYREL) 50 MG tablet TAKE ONE TABLET BY MOUTH AT BEDTIME 09/01/16  Yes Elsia Nigel Sloop, DO  nitroGLYCERIN (NITROSTAT) 0.4 MG SL tablet Place 1 tablet (0.4 mg total) under the tongue every 5 (five) minutes as needed for chest pain (As needed for chest pain). 05/02/16   Sela Hilding, MD    Family History Family History  Problem Relation Age of Onset  . Heart attack Mother   . Addison's disease Father   . Heart attack Sister   . Heart disease Brother   . Prostate cancer Brother   . Heart disease Sister   . Heart disease Brother   .  Lung cancer Brother   . Anesthesia problems Neg Hx   . Hypotension Neg Hx   . Malignant hyperthermia Neg Hx   . Pseudochol deficiency Neg Hx     Social History Social History  Substance Use Topics  . Smoking status: Never Smoker  . Smokeless tobacco: Never Used  . Alcohol use No     Allergies   Patient has no known allergies.   Review of Systems Review of Systems  HENT: Negative.   Respiratory: Positive for  cough.   Cardiovascular: Negative.   Gastrointestinal: Negative.   Musculoskeletal: Negative.   Skin: Negative.   Allergic/Immunologic: Positive for immunocompromised state.       Cancer patient, chemotherapy  Neurological: Positive for weakness.  Psychiatric/Behavioral: Negative.   All other systems reviewed and are negative.    Physical Exam Updated Vital Signs BP (!) 148/70 (BP Location: Left Arm)   Pulse 77   Temp 99.3 F (37.4 C) (Oral)   Resp 12   Ht 5\' 7"  (1.702 m)   Wt 163 lb (73.9 kg)   SpO2 90%   BMI 25.53 kg/m   Physical Exam  Constitutional: He appears well-developed and well-nourished. No distress.  HENT:  Head: Normocephalic and atraumatic.  Eyes: Conjunctivae are normal. Pupils are equal, round, and reactive to light.  Neck: Neck supple. No tracheal deviation present. No thyromegaly present.  Cardiovascular: Normal rate and regular rhythm.   No murmur heard. Pulmonary/Chest: Effort normal and breath sounds normal.  Abdominal: Soft. Bowel sounds are normal. He exhibits no distension. There is no tenderness.  Musculoskeletal: Normal range of motion. He exhibits no edema or tenderness.  Neurological: He is alert. Coordination normal.  Skin: Skin is warm and dry. No rash noted.  Psychiatric: He has a normal mood and affect.  Nursing note and vitals reviewed.    ED Treatments / Results  Labs (all labs ordered are listed, but only abnormal results are displayed) Labs Reviewed  CULTURE, BLOOD (ROUTINE X 2)  CULTURE, BLOOD (ROUTINE X 2)  URINE CULTURE  COMPREHENSIVE METABOLIC PANEL  CBC WITH DIFFERENTIAL/PLATELET  URINALYSIS, ROUTINE W REFLEX MICROSCOPIC  I-STAT CG4 LACTIC ACID, ED    EKG  EKG Interpretation None       Radiology No results found.  Procedures Procedures (including critical care time)  Medications Ordered in ED Medications - No data to display   Initial Impression / Assessment and Plan / ED Course  I have reviewed the  triage vital signs and the nursing notes.  Pertinent labs & imaging results that were available during my care of the patient were reviewed by me and considered in my medical decision making (see chart for details).     9 PM patient complains of burning his chest typical of acid indigestion that he's expressed the past. GI cocktail ordered. 9:20 PM chest discomfort much improved and almost gone after treatment with GI cocktail.  10:15 PM patient asymptomatic, resting comfortably. Feels ready to go home. Case discussed with Dr.Gorsuch on call for Dr.Sherrill, oncologist. Plan patient to call office Monday, 12/06/2016 at 8 AM for follow-up. Return to the emergency department feels worse for any reason Final diagnoses:  None  Diagnosis fever New Prescriptions New Prescriptions   No medications on file     Orlie Dakin, MD 12/04/16 2224

## 2016-12-04 NOTE — ED Triage Notes (Signed)
Pt presents to [facility] today from home. He received his first chemotherapy treatment on 11/28/16 and had his pump out this morning. This afternoon he began feeling badly and had a fever of 100.7 at home. He called the cancer center who told him to come to the ED. He did not take any medications at home based on their guidance. He denies pain other than headache. He has been diagnosed with stomach and liver cancer.

## 2016-12-04 NOTE — Patient Instructions (Signed)
Implanted Port Home Guide An implanted port is a type of central line that is placed under the skin. Central lines are used to provide IV access when treatment or nutrition needs to be given through a person's veins. Implanted ports are used for long-term IV access. An implanted port may be placed because:  You need IV medicine that would be irritating to the small veins in your hands or arms.  You need long-term IV medicines, such as antibiotics.  You need IV nutrition for a long period.  You need frequent blood draws for lab tests.  You need dialysis.  Implanted ports are usually placed in the chest area, but they can also be placed in the upper arm, the abdomen, or the leg. An implanted port has two main parts:  Reservoir. The reservoir is round and will appear as a small, raised area under your skin. The reservoir is the part where a needle is inserted to give medicines or draw blood.  Catheter. The catheter is a thin, flexible tube that extends from the reservoir. The catheter is placed into a large vein. Medicine that is inserted into the reservoir goes into the catheter and then into the vein.  How will I care for my incision site? Do not get the incision site wet. Bathe or shower as directed by your health care provider. How is my port accessed? Special steps must be taken to access the port:  Before the port is accessed, a numbing cream can be placed on the skin. This helps numb the skin over the port site.  Your health care provider uses a sterile technique to access the port. ? Your health care provider must put on a mask and sterile gloves. ? The skin over your port is cleaned carefully with an antiseptic and allowed to dry. ? The port is gently pinched between sterile gloves, and a needle is inserted into the port.  Only "non-coring" port needles should be used to access the port. Once the port is accessed, a blood return should be checked. This helps ensure that the port  is in the vein and is not clogged.  If your port needs to remain accessed for a constant infusion, a clear (transparent) bandage will be placed over the needle site. The bandage and needle will need to be changed every week, or as directed by your health care provider.  Keep the bandage covering the needle clean and dry. Do not get it wet. Follow your health care provider's instructions on how to take a shower or bath while the port is accessed.  If your port does not need to stay accessed, no bandage is needed over the port.  What is flushing? Flushing helps keep the port from getting clogged. Follow your health care provider's instructions on how and when to flush the port. Ports are usually flushed with saline solution or a medicine called heparin. The need for flushing will depend on how the port is used.  If the port is used for intermittent medicines or blood draws, the port will need to be flushed: ? After medicines have been given. ? After blood has been drawn. ? As part of routine maintenance.  If a constant infusion is running, the port may not need to be flushed.  How long will my port stay implanted? The port can stay in for as long as your health care provider thinks it is needed. When it is time for the port to come out, surgery will be   done to remove it. The procedure is similar to the one performed when the port was put in. When should I seek immediate medical care? When you have an implanted port, you should seek immediate medical care if:  You notice a bad smell coming from the incision site.  You have swelling, redness, or drainage at the incision site.  You have more swelling or pain at the port site or the surrounding area.  You have a fever that is not controlled with medicine.  This information is not intended to replace advice given to you by your health care provider. Make sure you discuss any questions you have with your health care provider. Document  Released: 08/16/2005 Document Revised: 01/22/2016 Document Reviewed: 04/23/2013 Elsevier Interactive Patient Education  2017 [employer].  

## 2016-12-04 NOTE — Discharge Instructions (Signed)
Call. Dr. Gearldine Shown office Monday, 12/06/2016 at 8 AM. Tell office staff that you were seen here, and that Dr. Cathleen Fears spoke with Dr.Gorsuch about your case. You had blood cultures and urine cultures obtained for fever. Ask office staff to check the results. You may need to be seen by [name] in the office. Return if you feel worse for any reason between now and April 9

## 2016-12-06 ENCOUNTER — Telehealth: Payer: Self-pay | Admitting: Medical Oncology

## 2016-12-06 LAB — URINE CULTURE: Culture: <10000 — AB

## 2016-12-06 NOTE — Telephone Encounter (Signed)
D/c sat from ED after fever 100.4, no other symptoms. He wanted Dr Benay Spice to know that Blood cultures were done . I told pt blood cultures were negative so far. He wants a call with final results. He had temp last night of 100.4 took tylenol and came down to 99.1.   I told him to call us back if fever gets to 100.5 or higher.

## 2016-12-09 ENCOUNTER — Ambulatory Visit: Payer: Medicare Other

## 2016-12-09 ENCOUNTER — Ambulatory Visit: Payer: Medicare Other | Admitting: Nurse Practitioner

## 2016-12-09 ENCOUNTER — Other Ambulatory Visit: Payer: Medicare Other

## 2016-12-10 LAB — CULTURE, BLOOD (ROUTINE X 2)
CULTURE: NO GROWTH
CULTURE: NO GROWTH
SPECIAL REQUESTS: ADEQUATE
Special Requests: ADEQUATE

## 2016-12-12 ENCOUNTER — Other Ambulatory Visit: Payer: Self-pay | Admitting: Oncology

## 2016-12-16 ENCOUNTER — Other Ambulatory Visit (HOSPITAL_BASED_OUTPATIENT_CLINIC_OR_DEPARTMENT_OTHER): Payer: Medicare Other

## 2016-12-16 ENCOUNTER — Ambulatory Visit (HOSPITAL_BASED_OUTPATIENT_CLINIC_OR_DEPARTMENT_OTHER): Payer: Medicare Other

## 2016-12-16 ENCOUNTER — Telehealth: Payer: Self-pay | Admitting: Oncology

## 2016-12-16 ENCOUNTER — Ambulatory Visit (HOSPITAL_BASED_OUTPATIENT_CLINIC_OR_DEPARTMENT_OTHER): Payer: Medicare Other | Admitting: Nurse Practitioner

## 2016-12-16 VITALS — BP 118/60 | HR 88 | Temp 98.1°F | Resp 18 | Wt 158.3 lb

## 2016-12-16 DIAGNOSIS — G8929 Other chronic pain: Secondary | ICD-10-CM | POA: Diagnosis not present

## 2016-12-16 DIAGNOSIS — C179 Malignant neoplasm of small intestine, unspecified: Secondary | ICD-10-CM

## 2016-12-16 DIAGNOSIS — R634 Abnormal weight loss: Secondary | ICD-10-CM | POA: Diagnosis not present

## 2016-12-16 DIAGNOSIS — Z5111 Encounter for antineoplastic chemotherapy: Secondary | ICD-10-CM

## 2016-12-16 DIAGNOSIS — Z95828 Presence of other vascular implants and grafts: Secondary | ICD-10-CM

## 2016-12-16 LAB — CBC WITH DIFFERENTIAL/PLATELET
BASO%: 0.5 % (ref 0.0–2.0)
BASOS ABS: 0 10*3/uL (ref 0.0–0.1)
EOS%: 0.4 % (ref 0.0–7.0)
Eosinophils Absolute: 0 10*3/uL (ref 0.0–0.5)
HEMATOCRIT: 28.8 % — AB (ref 38.4–49.9)
HEMOGLOBIN: 9.6 g/dL — AB (ref 13.0–17.1)
LYMPH%: 14.5 % (ref 14.0–49.0)
MCH: 29.7 pg (ref 27.2–33.4)
MCHC: 33.2 g/dL (ref 32.0–36.0)
MCV: 89.4 fL (ref 79.3–98.0)
MONO#: 0.7 10*3/uL (ref 0.1–0.9)
MONO%: 9 % (ref 0.0–14.0)
NEUT%: 75.6 % — ABNORMAL HIGH (ref 39.0–75.0)
NEUTROS ABS: 6.1 10*3/uL (ref 1.5–6.5)
Platelets: 331 10*3/uL (ref 140–400)
RBC: 3.22 10*6/uL — ABNORMAL LOW (ref 4.20–5.82)
RDW: 14.5 % (ref 11.0–14.6)
WBC: 8.1 10*3/uL (ref 4.0–10.3)
lymph#: 1.2 10*3/uL (ref 0.9–3.3)

## 2016-12-16 LAB — COMPREHENSIVE METABOLIC PANEL
ALBUMIN: 2.9 g/dL — AB (ref 3.5–5.0)
ALK PHOS: 211 U/L — AB (ref 40–150)
ALT: 27 U/L (ref 0–55)
AST: 53 U/L — AB (ref 5–34)
Anion Gap: 10 mEq/L (ref 3–11)
BILIRUBIN TOTAL: 0.42 mg/dL (ref 0.20–1.20)
BUN: 18.8 mg/dL (ref 7.0–26.0)
CALCIUM: 10.6 mg/dL — AB (ref 8.4–10.4)
CHLORIDE: 102 meq/L (ref 98–109)
CO2: 25 mEq/L (ref 22–29)
Creatinine: 0.8 mg/dL (ref 0.7–1.3)
EGFR: 79 mL/min/{1.73_m2} — ABNORMAL LOW (ref >90)
Glucose: 106 mg/dl (ref 70–140)
Potassium: 4.6 mEq/L (ref 3.5–5.1)
Sodium: 137 mEq/L (ref 136–145)
Total Protein: 7 g/dL (ref 6.4–8.3)

## 2016-12-16 MED ORDER — PALONOSETRON HCL INJECTION 0.25 MG/5ML
INTRAVENOUS | Status: AC
  Filled 2016-12-16: qty 5

## 2016-12-16 MED ORDER — LEUCOVORIN CALCIUM INJECTION 350 MG
400.0000 mg/m2 | Freq: Once | INTRAMUSCULAR | Status: AC
  Administered 2016-12-16: 752 mg via INTRAVENOUS
  Filled 2016-12-16: qty 37.6

## 2016-12-16 MED ORDER — DEXTROSE 5 % IV SOLN
Freq: Once | INTRAVENOUS | Status: AC
  Administered 2016-12-16: 13:00:00 via INTRAVENOUS

## 2016-12-16 MED ORDER — PALONOSETRON HCL INJECTION 0.25 MG/5ML
0.2500 mg | Freq: Once | INTRAVENOUS | Status: AC
  Administered 2016-12-16: 0.25 mg via INTRAVENOUS

## 2016-12-16 MED ORDER — OXALIPLATIN CHEMO INJECTION 100 MG/20ML
85.0000 mg/m2 | Freq: Once | INTRAVENOUS | Status: AC
  Administered 2016-12-16: 160 mg via INTRAVENOUS
  Filled 2016-12-16: qty 32

## 2016-12-16 MED ORDER — DEXAMETHASONE SODIUM PHOSPHATE 10 MG/ML IJ SOLN
INTRAMUSCULAR | Status: AC
  Filled 2016-12-16: qty 1

## 2016-12-16 MED ORDER — DEXAMETHASONE SODIUM PHOSPHATE 10 MG/ML IJ SOLN
10.0000 mg | Freq: Once | INTRAMUSCULAR | Status: AC
  Administered 2016-12-16: 10 mg via INTRAVENOUS

## 2016-12-16 MED ORDER — SODIUM CHLORIDE 0.9% FLUSH
10.0000 mL | Freq: Once | INTRAVENOUS | Status: AC
  Administered 2016-12-16: 10 mL
  Filled 2016-12-16: qty 10

## 2016-12-16 MED ORDER — SODIUM CHLORIDE 0.9 % IV SOLN
2400.0000 mg/m2 | INTRAVENOUS | Status: DC
  Administered 2016-12-16: 4500 mg via INTRAVENOUS
  Filled 2016-12-16: qty 90

## 2016-12-16 NOTE — Telephone Encounter (Signed)
Appointments scheduled per 12/16/16 los. Patient was given a copy of the AVS report and appointment schedule per 12/16/16 los. °

## 2016-12-16 NOTE — Progress Notes (Addendum)
[place] OFFICE PROGRESS NOTE   Diagnosis: Small bowel carcinoma   INTERVAL HISTORY:   [name] returns as scheduled. He completed cycle 1 FOLFOX 12/02/2016. He was seen in the emergency Department on 12/04/2016 with a fever, temperature 100.7. Temperature in the emergency department 99.3. Blood culture showed no growth, urine culture showed no significant growth. Influenza panel negative. Chest x-ray negative for pneumonia.  [name] reports daily low-grade fever in the evenings. He takes Tylenol with relief. No associated shaking chills. He has mild intermittent nausea. No vomiting. He noted mouth soreness following the chemotherapy. No ulcers. He had loose stools on one occasion. No frank diarrhea. He continues to have chest pain every 2-3 nights. He feels the pain is related to severe reflux. He takes nitroglycerin, pain medication and Tums with relief.  Objective:  Vital signs in last 24 hours:  Blood pressure 118/60, pulse 88, temperature 98.1 F (36.7 C), temperature source Oral, resp. rate 18, weight 158 lb 4.8 oz (71.8 kg), SpO2 97 %.    HEENT: No thrush or ulcers. Resp: Lungs clear bilaterally. Cardio: Regular rate and rhythm. GI: Abdomen soft and nontender. No hepatomegaly. Vascular: No leg edema. Neuro: Alert and oriented.  Port-A-Cath without erythema.  Lab Results:  Lab Results  Component Value Date   WBC 8.1 12/16/2016   HGB 9.6 (L) 12/16/2016   HCT 28.8 (L) 12/16/2016   MCV 89.4 12/16/2016   PLT 331 12/16/2016   NEUTROABS 6.1 12/16/2016    Imaging:  No results found.  Medications: I have reviewed the patient's current medications.  Assessment/Plan: 1. Duodenal mass, status post a biopsy 04/30/2016 revealing a tubulovillous adenoma-high-grade dysplasia not identified  Upper endoscopy 10/28/2016 confirmed a duodenal mass surrounding the major papilla, nonobstructing, biopsy confirmed invasive adenocarcinoma  CT abdomen/pelvis  11/17/2016-multiple hepatic metastases progression of the proximal small bowel tumor, no biliary obstruction  Cycle 1 FOLFOX 12/02/2016  Cycle 2 FOLFOX 12/16/2016  2. Biliary obstruction secondary to #1, status post placement of an internal-external biliary drain on 05/01/2016, common bile duct stent 06/01/2016  3. Klebsiella bacteremia 04/26/2016 secondary to biliary obstruction  4. Anemia secondary to bacteremia and chronic disease  5. Right foot drop  6. Coronary artery disease  7. Arrhythmia-status post pacemaker placement  8. History prostate cancer treated with radiation  9. Irregular hyperpigmented mole at the parietal scalp  10. Mild hypercalcemia-he has discontinued calcium supplement (takes TUMS for reflux)    Disposition: [name] appears stable. He has completed 1 cycle of FOLFOX. Plan to proceed with cycle 2 today as scheduled. We will check a CEA when he returns in 2 weeks.  For the chest pain he will try Tums initially and then pain medication if no relief. He will contact the office if he has pain despite these measures. He is taking Prilosec and Zantac daily.  He has lost some weight since his last visit. We will make a referral to the [place] dietitian.  He will return for a follow-up visit and cycle 3 FOLFOX in 2 weeks. The plan is to obtain restaging CT scans after 5 cycles.  Patient seen with [name]. 25 minutes were spent face-to-face at today's visit with the majority of that time involved in counseling/coordination of care.    Ned Card ANP/GNP-BC   12/16/2016  12:14 PM  This was a shared visit with Ned Card. [name] was interviewed and examined. He tolerated the first cycle of FOLFOX well. The plan is to proceed  with cycle 2 today.  The low-grade fever is likely related to tumor fever. He will use Tylenol as needed.  Julieanne Manson, M.D.

## 2016-12-16 NOTE — Patient Instructions (Signed)
[facility] Discharge Instructions for Patients Receiving Chemotherapy  Today you received the following chemotherapy agents:  Leucovorin, Fluorouracil, Oxaliplatin,  To help prevent nausea and vomiting after your treatment, we encourage you to take your nausea medication.   If you develop nausea and vomiting that is not controlled by your nausea medication, call the clinic.   BELOW ARE SYMPTOMS THAT SHOULD BE REPORTED IMMEDIATELY:  *FEVER GREATER THAN 100.5 F  *CHILLS WITH OR WITHOUT FEVER  NAUSEA AND VOMITING THAT IS NOT CONTROLLED WITH YOUR NAUSEA MEDICATION  *UNUSUAL SHORTNESS OF BREATH  *UNUSUAL BRUISING OR BLEEDING  TENDERNESS IN MOUTH AND THROAT WITH OR WITHOUT PRESENCE OF ULCERS  *URINARY PROBLEMS  *BOWEL PROBLEMS  UNUSUAL RASH Items with * indicate a potential emergency and should be followed up as soon as possible.  Feel free to call the clinic you have any questions or concerns. The clinic phone number is [phone].  Please show the [facility] at check-in to the Emergency Department and triage nurse.

## 2016-12-18 ENCOUNTER — Ambulatory Visit (HOSPITAL_BASED_OUTPATIENT_CLINIC_OR_DEPARTMENT_OTHER): Payer: Medicare Other

## 2016-12-18 VITALS — BP 106/49 | HR 65 | Temp 99.1°F | Resp 16

## 2016-12-18 DIAGNOSIS — C179 Malignant neoplasm of small intestine, unspecified: Secondary | ICD-10-CM | POA: Diagnosis not present

## 2016-12-18 MED ORDER — SODIUM CHLORIDE 0.9% FLUSH
10.0000 mL | INTRAVENOUS | Status: DC | PRN
  Administered 2016-12-18: 10 mL
  Filled 2016-12-18: qty 10

## 2016-12-18 MED ORDER — HEPARIN SOD (PORK) LOCK FLUSH 100 UNIT/ML IV SOLN
500.0000 [IU] | Freq: Once | INTRAVENOUS | Status: AC | PRN
  Administered 2016-12-18: 500 [IU]
  Filled 2016-12-18: qty 5

## 2016-12-22 ENCOUNTER — Ambulatory Visit: Payer: Medicare Other | Admitting: Pulmonary Disease

## 2016-12-24 ENCOUNTER — Ambulatory Visit: Payer: Medicare Other

## 2016-12-24 ENCOUNTER — Telehealth: Payer: Self-pay | Admitting: *Deleted

## 2016-12-24 ENCOUNTER — Other Ambulatory Visit: Payer: Self-pay | Admitting: *Deleted

## 2016-12-24 DIAGNOSIS — N390 Urinary tract infection, site not specified: Secondary | ICD-10-CM

## 2016-12-24 MED ORDER — CIPROFLOXACIN HCL 500 MG PO TABS: 500.0000 mg | ORAL_TABLET | Freq: Two times a day (BID) | ORAL | 0 refills | Status: DC

## 2016-12-24 NOTE — Addendum Note (Signed)
Addended by: Rosalio Macadamia C on: 12/24/2016 12:10 PM   Modules accepted: Orders

## 2016-12-24 NOTE — Telephone Encounter (Signed)
Spoke with pt in lobby, he is unable to collect urine specimen. Reports he "has no control." Pt declined assistance from RN to collect specimen. Reviewed with [name]: Order received for Cipro 500 mg BID for 3 days. Pt instructed to call office if no improvement after course of antibiotic.

## 2016-12-24 NOTE — Telephone Encounter (Signed)
Pt called and left message about urinary problem.  Spoke with pt and was informed that pt has had burning, pain at bladder area, and incontinence with urine for past 3 - 4 days.  Stated had diarrhea last week but resolved now after taking Imodium.  Denied fever.  Stated able to drink po fluids - but not much due to incontinence issue.  Pt would like to know what [name] would recommend. Pt's    Phone     [phone].

## 2016-12-24 NOTE — Telephone Encounter (Signed)
Spoke with pt and instructed pt to come in

## 2016-12-24 NOTE — Telephone Encounter (Signed)
Spoke with pt and instructed pt to come in now for UA and Urine culture as per Dr. Gearldine Shown order.  Pt voiced understanding.

## 2016-12-26 ENCOUNTER — Other Ambulatory Visit: Payer: Self-pay | Admitting: Oncology

## 2016-12-27 ENCOUNTER — Other Ambulatory Visit: Payer: Self-pay | Admitting: Family Medicine

## 2016-12-27 ENCOUNTER — Other Ambulatory Visit: Payer: Self-pay | Admitting: Oncology

## 2016-12-28 ENCOUNTER — Telehealth: Payer: Self-pay

## 2016-12-28 MED ORDER — TRAMADOL HCL 50 MG PO TABS: 50.0000 mg | ORAL_TABLET | Freq: Four times a day (QID) | ORAL | 0 refills | Status: DC | PRN

## 2016-12-28 NOTE — Telephone Encounter (Signed)
Pt called for tramadol refill. Called him and verified which pharmacy. [facility] on [address]. Called in Rx per protocol.

## 2016-12-29 ENCOUNTER — Telehealth: Payer: Self-pay | Admitting: *Deleted

## 2016-12-29 NOTE — Telephone Encounter (Signed)
Returned patient call ASAP as requested.   "Someone was to call in Tramadol yesterday.  My wife went to [facility] on [address] at 6:00 pm and again this morning and no order has been received."  Order was called in to the [facility] on [address].  "I'll tell her to go here.  Will you please send refills to [facility] not [facility] in the future.  I get my medications from the [place] or [facility]"  Updated preferred medicine list per patient request.

## 2016-12-30 ENCOUNTER — Ambulatory Visit: Payer: Medicare Other

## 2016-12-30 ENCOUNTER — Ambulatory Visit: Payer: Medicare Other | Admitting: Nutrition

## 2016-12-30 ENCOUNTER — Other Ambulatory Visit (HOSPITAL_BASED_OUTPATIENT_CLINIC_OR_DEPARTMENT_OTHER): Payer: Medicare Other

## 2016-12-30 ENCOUNTER — Ambulatory Visit (HOSPITAL_BASED_OUTPATIENT_CLINIC_OR_DEPARTMENT_OTHER): Payer: Medicare Other | Admitting: Oncology

## 2016-12-30 ENCOUNTER — Ambulatory Visit (HOSPITAL_BASED_OUTPATIENT_CLINIC_OR_DEPARTMENT_OTHER): Payer: Medicare Other

## 2016-12-30 VITALS — BP 121/59 | HR 74 | Temp 98.5°F | Resp 17 | Ht 67.0 in | Wt 158.4 lb

## 2016-12-30 DIAGNOSIS — C179 Malignant neoplasm of small intestine, unspecified: Secondary | ICD-10-CM | POA: Diagnosis not present

## 2016-12-30 DIAGNOSIS — Z8546 Personal history of malignant neoplasm of prostate: Secondary | ICD-10-CM | POA: Diagnosis not present

## 2016-12-30 DIAGNOSIS — Z5111 Encounter for antineoplastic chemotherapy: Secondary | ICD-10-CM | POA: Diagnosis not present

## 2016-12-30 DIAGNOSIS — G629 Polyneuropathy, unspecified: Secondary | ICD-10-CM

## 2016-12-30 DIAGNOSIS — Z95828 Presence of other vascular implants and grafts: Secondary | ICD-10-CM

## 2016-12-30 LAB — CBC WITH DIFFERENTIAL/PLATELET
BASO%: 0.2 % (ref 0.0–2.0)
Basophils Absolute: 0 10*3/uL (ref 0.0–0.1)
EOS%: 0.1 % (ref 0.0–7.0)
Eosinophils Absolute: 0 10*3/uL (ref 0.0–0.5)
HCT: 29.6 % — ABNORMAL LOW (ref 38.4–49.9)
HEMOGLOBIN: 9.7 g/dL — AB (ref 13.0–17.1)
LYMPH#: 0.6 10*3/uL — AB (ref 0.9–3.3)
LYMPH%: 5.8 % — ABNORMAL LOW (ref 14.0–49.0)
MCH: 29.2 pg (ref 27.2–33.4)
MCHC: 32.8 g/dL (ref 32.0–36.0)
MCV: 89.2 fL (ref 79.3–98.0)
MONO#: 0.7 10*3/uL (ref 0.1–0.9)
MONO%: 7.2 % (ref 0.0–14.0)
NEUT#: 8.8 10*3/uL — ABNORMAL HIGH (ref 1.5–6.5)
NEUT%: 86.7 % — AB (ref 39.0–75.0)
Platelets: 209 10*3/uL (ref 140–400)
RBC: 3.32 10*6/uL — AB (ref 4.20–5.82)
RDW: 14.6 % (ref 11.0–14.6)
WBC: 10.1 10*3/uL (ref 4.0–10.3)
nRBC: 0 % (ref 0–0)

## 2016-12-30 LAB — COMPREHENSIVE METABOLIC PANEL
ALBUMIN: 2.8 g/dL — AB (ref 3.5–5.0)
ALT: 30 U/L (ref 0–55)
AST: 66 U/L — AB (ref 5–34)
Alkaline Phosphatase: 206 U/L — ABNORMAL HIGH (ref 40–150)
Anion Gap: 10 mEq/L (ref 3–11)
BUN: 18.5 mg/dL (ref 7.0–26.0)
CO2: 26 meq/L (ref 22–29)
Calcium: 10.3 mg/dL (ref 8.4–10.4)
Chloride: 101 mEq/L (ref 98–109)
Creatinine: 0.8 mg/dL (ref 0.7–1.3)
EGFR: 80 mL/min/{1.73_m2} — AB (ref >90)
GLUCOSE: 138 mg/dL (ref 70–140)
POTASSIUM: 4.1 meq/L (ref 3.5–5.1)
SODIUM: 137 meq/L (ref 136–145)
Total Bilirubin: 0.46 mg/dL (ref 0.20–1.20)
Total Protein: 7 g/dL (ref 6.4–8.3)

## 2016-12-30 LAB — CEA (IN HOUSE-CHCC): CEA (CHCC-In House): 204.34 ng/mL — ABNORMAL HIGH (ref 0.00–5.00)

## 2016-12-30 MED ORDER — DEXTROSE 5 % IV SOLN
Freq: Once | INTRAVENOUS | Status: AC
  Administered 2016-12-30: 11:00:00 via INTRAVENOUS

## 2016-12-30 MED ORDER — SODIUM CHLORIDE 0.9 % IV SOLN
2400.0000 mg/m2 | INTRAVENOUS | Status: DC
  Administered 2016-12-30: 4500 mg via INTRAVENOUS
  Filled 2016-12-30: qty 90

## 2016-12-30 MED ORDER — DEXTROSE 5 % IV SOLN
80.0000 mg/m2 | Freq: Once | INTRAVENOUS | Status: AC
  Administered 2016-12-30: 150 mg via INTRAVENOUS
  Filled 2016-12-30: qty 20

## 2016-12-30 MED ORDER — HEPARIN SOD (PORK) LOCK FLUSH 100 UNIT/ML IV SOLN
500.0000 [IU] | Freq: Once | INTRAVENOUS | Status: AC
  Administered 2016-12-30: 500 [IU] via INTRAVENOUS
  Filled 2016-12-30: qty 5

## 2016-12-30 MED ORDER — SODIUM CHLORIDE 0.9% FLUSH
10.0000 mL | INTRAVENOUS | Status: DC | PRN
  Filled 2016-12-30: qty 10

## 2016-12-30 MED ORDER — PALONOSETRON HCL INJECTION 0.25 MG/5ML
INTRAVENOUS | Status: AC
  Filled 2016-12-30: qty 5

## 2016-12-30 MED ORDER — PALONOSETRON HCL INJECTION 0.25 MG/5ML
0.2500 mg | Freq: Once | INTRAVENOUS | Status: AC
  Administered 2016-12-30: 0.25 mg via INTRAVENOUS

## 2016-12-30 MED ORDER — DEXAMETHASONE SODIUM PHOSPHATE 10 MG/ML IJ SOLN
10.0000 mg | Freq: Once | INTRAMUSCULAR | Status: AC
  Administered 2016-12-30: 10 mg via INTRAVENOUS

## 2016-12-30 MED ORDER — DEXAMETHASONE SODIUM PHOSPHATE 10 MG/ML IJ SOLN
INTRAMUSCULAR | Status: AC
  Filled 2016-12-30: qty 1

## 2016-12-30 MED ORDER — LEUCOVORIN CALCIUM INJECTION 350 MG
400.0000 mg/m2 | Freq: Once | INTRAMUSCULAR | Status: AC
  Administered 2016-12-30: 752 mg via INTRAVENOUS
  Filled 2016-12-30: qty 37.6

## 2016-12-30 NOTE — Progress Notes (Signed)
[name]   Diagnosis: Small bowel carcinoma  INTERVAL HISTORY:   [name] returns as scheduled. He completed cycle 2 FOLFOX 12/16/2016. He reports no mouth sores or nausea following chemotherapy. He had mild diarrhea. He has reflux symptoms at night. He reports a fall yesterday while using his walker. He has chronic neuropathy symptoms, worse in the feet. No cold sensitivity. He reports increased numbness in the feet, stable numbness in the hands since beginning chemotherapy.  He is here today with his grandson. He was treated for urinary tract infection last week after presenting with burning with urination and incontinence. He reports these symptoms have improved.  Objective:  Vital signs in last 24 hours:  Blood pressure (!) 121/59, pulse 74, temperature 98.5 F (36.9 C), temperature source Oral, resp. rate 17, height 5\' 7"  (1.702 m), weight 158 lb 6.4 oz (71.8 kg), SpO2 96 %.    HEENT: Small ecchymosis at the right buccal mucosa, no thrush or ulcers Resp: Lungs clear bilaterally Cardio: Regular rate and rhythm GI: No hepatosplenomegaly, nontender Vascular: No leg edema Neuro: Sensation is intact to light touch at the soles, moderate-severe loss of vibratory sense at the fingertips bilaterally    Portacath/PICC-without erythema  Lab Results:  Lab Results  Component Value Date   WBC 10.1 12/30/2016   HGB 9.7 (L) 12/30/2016   HCT 29.6 (L) 12/30/2016   MCV 89.2 12/30/2016   PLT 209 12/30/2016   NEUTROABS 8.8 (H) 12/30/2016    CMP     Component Value Date/Time   NA 137 12/30/2016 0908   K 4.1 12/30/2016 0908   CL 101 12/04/2016 1957   CO2 26 12/30/2016 0908   GLUCOSE 138 12/30/2016 0908   BUN 18.5 12/30/2016 0908   CREATININE 0.8 12/30/2016 0908   CALCIUM 10.3 12/30/2016 0908   PROT 7.0 12/30/2016 0908   ALBUMIN 2.8 (L) 12/30/2016 0908   AST 66 (H) 12/30/2016 0908   ALT 30 12/30/2016 0908   ALKPHOS 206 (H) 12/30/2016  0908   BILITOT 0.46 12/30/2016 0908   GFRNONAA >60 12/04/2016 1957   GFRNONAA 82 05/14/2016 1545   GFRAA >60 12/04/2016 1957   GFRAA >89 05/14/2016 1545    Lab Results  Component Value Date   CEA1 204.34 (H) 12/30/2016     Medications: I have reviewed the patient's current medications.  Assessment/Plan: 1. Duodenal mass, status post a biopsy 04/30/2016 revealing a tubulovillous adenoma-high-grade dysplasia not identified  Upper endoscopy 10/28/2016 confirmed a duodenal mass surrounding the major papilla, nonobstructing, biopsy confirmed invasive adenocarcinoma  CT abdomen/pelvis 11/17/2016-multiple hepatic metastases progression of the proximal small bowel tumor, no biliary obstruction  Cycle 1 FOLFOX 12/02/2016  Cycle 2 FOLFOX 12/16/2016  Cycle 3 FOLFOX 12/30/2016  2. Biliary obstruction secondary to #1, status post placement of an internal-external biliary drain on 05/01/2016, common bile duct stent 06/01/2016  3. Klebsiella bacteremia 04/26/2016 secondary to biliary obstruction  4. Anemia secondary to bacteremia and chronic disease  5. Right foot drop  6. Coronary artery disease  7. Arrhythmia-status post pacemaker placement  8. History prostate cancer treated with radiation  9. Irregular hyperpigmented mole at the parietal scalp  10. Mild hypercalcemia-he has discontinued calcium supplement (takes TUMS for reflux)  11. Peripheral neuropathy-predating chemotherapy     Disposition:  Mr. Delillo has completed 2 cycles of FOLFOX. He appears to be tolerating the chemotherapy well. He was treated for a urinary tract infection last week.  The plan is to proceed with  cycle 3 FOLFOX today. We will follow-up on the CEA from today. He will undergo a restaging CT evaluation after cycle 4.  The neuropathy symptoms are most likely unrelated to chemotherapy. We will consider discontinuing oxaliplatin if the neuropathy symptoms  progress.   Shane Coder, MD  12/30/2016  1:33 PM

## 2016-12-30 NOTE — Patient Instructions (Addendum)
[facility] Discharge Instructions for Patients Receiving Chemotherapy  Today you received the following chemotherapy agents:  Leucovorin, Fluorouracil, Oxaliplatin,  To help prevent nausea and vomiting after your treatment, we encourage you to take your nausea medication. NO ZOFRAN FOR THE FIRST 3 DAYS -TAKE YOUR COMPAZINE THE FIRST 3 DAYS AFTER CHEMO.   If you develop nausea and vomiting that is not controlled by your nausea medication, call the clinic.   BELOW ARE SYMPTOMS THAT SHOULD BE REPORTED IMMEDIATELY:  *FEVER GREATER THAN 100.5 F  *CHILLS WITH OR WITHOUT FEVER  NAUSEA AND VOMITING THAT IS NOT CONTROLLED WITH YOUR NAUSEA MEDICATION  *UNUSUAL SHORTNESS OF BREATH  *UNUSUAL BRUISING OR BLEEDING  TENDERNESS IN MOUTH AND THROAT WITH OR WITHOUT PRESENCE OF ULCERS  *URINARY PROBLEMS  *BOWEL PROBLEMS  UNUSUAL RASH Items with * indicate a potential emergency and should be followed up as soon as possible.  Feel free to call the clinic you have any questions or concerns. The clinic phone number is [phone].  Please show the [place] at check-in to the Emergency Department and triage nurse.

## 2016-12-30 NOTE — Progress Notes (Signed)
83 year old male diagnosed with small bowel cancer.  He is a patient of Dr. Julieanne Manson.  Past medical history includes prostate cancer, PVD, home oxygen, MI, hypertension, hyperlipidemia, GERD, diverticulosis, depression, and CAD.  Medications include Protostat, Colace, ferrous sulfate, Folvite, Lasix, Prilosec, Compazine, and Senokot.  Labs include albumin 2.9 on April 19.  Height: 67 inches. Weight: 158.4 pounds. Usual body weight: 165 pounds. BMI: 24.81.  Patient reports his appetite comes and goes. He does report some nausea but this improves when he takes nausea medication. He has not currently having bowel issues.  However, reports he did have diarrhea last week Enjoys milk shakes and oral nutrition supplements.  Nutrition diagnosis:  Unintended weight loss related to small bowel cancer evidenced by 7 pound weight loss from usual body weight.  Intervention: I educated patient to consume high-calorie high-protein foods in small frequent meals and snacks. Encouraged foods as tolerated but did give recommendations for when patient has nausea. Encouraged a milk shake or oral nutrition supplement one to 2 times a day. Provided fact sheets and contact information.  Questions were answered.  Teach back method used.  Monitoring, evaluation, goals: Patient will tolerate adequate calories and protein to minimize weight loss throughout treatment.  Next visit: Thursday, May 17, during infusion.  **Disclaimer: This note was dictated with voice recognition software. Similar sounding words can inadvertently be transcribed and this note may contain transcription errors which may not have been corrected upon publication of note.**

## 2016-12-30 NOTE — Patient Instructions (Signed)
Implanted Port Home Guide An implanted port is a type of central line that is placed under the skin. Central lines are used to provide IV access when treatment or nutrition needs to be given through a person's veins. Implanted ports are used for long-term IV access. An implanted port may be placed because:  You need IV medicine that would be irritating to the small veins in your hands or arms.  You need long-term IV medicines, such as antibiotics.  You need IV nutrition for a long period.  You need frequent blood draws for lab tests.  You need dialysis.  Implanted ports are usually placed in the chest area, but they can also be placed in the upper arm, the abdomen, or the leg. An implanted port has two main parts:  Reservoir. The reservoir is round and will appear as a small, raised area under your skin. The reservoir is the part where a needle is inserted to give medicines or draw blood.  Catheter. The catheter is a thin, flexible tube that extends from the reservoir. The catheter is placed into a large vein. Medicine that is inserted into the reservoir goes into the catheter and then into the vein.  How will I care for my incision site? Do not get the incision site wet. Bathe or shower as directed by your health care provider. How is my port accessed? Special steps must be taken to access the port:  Before the port is accessed, a numbing cream can be placed on the skin. This helps numb the skin over the port site.  Your health care provider uses a sterile technique to access the port. ? Your health care provider must put on a mask and sterile gloves. ? The skin over your port is cleaned carefully with an antiseptic and allowed to dry. ? The port is gently pinched between sterile gloves, and a needle is inserted into the port.  Only "non-coring" port needles should be used to access the port. Once the port is accessed, a blood return should be checked. This helps ensure that the port  is in the vein and is not clogged.  If your port needs to remain accessed for a constant infusion, a clear (transparent) bandage will be placed over the needle site. The bandage and needle will need to be changed every week, or as directed by your health care provider.  Keep the bandage covering the needle clean and dry. Do not get it wet. Follow your health care provider's instructions on how to take a shower or bath while the port is accessed.  If your port does not need to stay accessed, no bandage is needed over the port.  What is flushing? Flushing helps keep the port from getting clogged. Follow your health care provider's instructions on how and when to flush the port. Ports are usually flushed with saline solution or a medicine called heparin. The need for flushing will depend on how the port is used.  If the port is used for intermittent medicines or blood draws, the port will need to be flushed: ? After medicines have been given. ? After blood has been drawn. ? As part of routine maintenance.  If a constant infusion is running, the port may not need to be flushed.  How long will my port stay implanted? The port can stay in for as long as your health care provider thinks it is needed. When it is time for the port to come out, surgery will be   done to remove it. The procedure is similar to the one performed when the port was put in. When should I seek immediate medical care? When you have an implanted port, you should seek immediate medical care if:  You notice a bad smell coming from the incision site.  You have swelling, redness, or drainage at the incision site.  You have more swelling or pain at the port site or the surrounding area.  You have a fever that is not controlled with medicine.  This information is not intended to replace advice given to you by your health care provider. Make sure you discuss any questions you have with your health care provider. Document  Released: 08/16/2005 Document Revised: 01/22/2016 Document Reviewed: 04/23/2013 Elsevier Interactive Patient Education  2017 [employer].  

## 2017-01-01 ENCOUNTER — Ambulatory Visit (HOSPITAL_BASED_OUTPATIENT_CLINIC_OR_DEPARTMENT_OTHER): Payer: Self-pay

## 2017-01-01 DIAGNOSIS — C179 Malignant neoplasm of small intestine, unspecified: Secondary | ICD-10-CM

## 2017-01-01 MED ORDER — HEPARIN SOD (PORK) LOCK FLUSH 100 UNIT/ML IV SOLN
500.0000 [IU] | Freq: Once | INTRAVENOUS | Status: AC | PRN
  Administered 2017-01-01: 500 [IU]
  Filled 2017-01-01: qty 5

## 2017-01-01 MED ORDER — SODIUM CHLORIDE 0.9% FLUSH
10.0000 mL | INTRAVENOUS | Status: DC | PRN
  Administered 2017-01-01: 10 mL
  Filled 2017-01-01: qty 10

## 2017-01-04 ENCOUNTER — Telehealth: Payer: Self-pay | Admitting: Oncology

## 2017-01-04 NOTE — Telephone Encounter (Signed)
Lab, Flush, follow up and Chemo scheduled for 01/27/17, per 12/30/16 los. Will get copy of updated appointment schedule at next visit.

## 2017-01-05 ENCOUNTER — Ambulatory Visit (INDEPENDENT_AMBULATORY_CARE_PROVIDER_SITE_OTHER): Payer: Medicare Other | Admitting: *Deleted

## 2017-01-05 DIAGNOSIS — I495 Sick sinus syndrome: Secondary | ICD-10-CM | POA: Diagnosis not present

## 2017-01-05 NOTE — Progress Notes (Signed)
Remote pacemaker transmission.   

## 2017-01-06 LAB — CUP PACEART REMOTE DEVICE CHECK
Brady Statistic AP VP Percent: 0 %
Brady Statistic AS VS Percent: 30 %
Date Time Interrogation Session: 20180509125239
Implantable Lead Implant Date: 20160812
Implantable Lead Location: 753859
Implantable Lead Location: 753860
Lead Channel Pacing Threshold Amplitude: 1.5 V
Lead Channel Pacing Threshold Pulse Width: 0.4 ms
Lead Channel Sensing Intrinsic Amplitude: 5.6 mV
Lead Channel Setting Pacing Amplitude: 2 V
Lead Channel Setting Pacing Pulse Width: 0.4 ms
Lead Channel Setting Sensing Sensitivity: 2 mV
MDC IDC LEAD IMPLANT DT: 20160812
MDC IDC MSMT BATTERY IMPEDANCE: 133 Ohm
MDC IDC MSMT BATTERY REMAINING LONGEVITY: 148 mo
MDC IDC MSMT BATTERY VOLTAGE: 2.78 V
MDC IDC MSMT LEADCHNL RA IMPEDANCE VALUE: 465 Ohm
MDC IDC MSMT LEADCHNL RA PACING THRESHOLD AMPLITUDE: 0.625 V
MDC IDC MSMT LEADCHNL RA PACING THRESHOLD PULSEWIDTH: 0.4 ms
MDC IDC MSMT LEADCHNL RA SENSING INTR AMPL: 2.8 mV
MDC IDC MSMT LEADCHNL RV IMPEDANCE VALUE: 527 Ohm
MDC IDC PG IMPLANT DT: 20160812
MDC IDC SET LEADCHNL RV PACING AMPLITUDE: 3 V
MDC IDC STAT BRADY AP VS PERCENT: 69 %
MDC IDC STAT BRADY AS VP PERCENT: 0 %

## 2017-01-07 ENCOUNTER — Encounter: Payer: Self-pay | Admitting: Cardiology

## 2017-01-10 ENCOUNTER — Telehealth: Payer: Self-pay | Admitting: *Deleted

## 2017-01-10 ENCOUNTER — Telehealth: Payer: Self-pay

## 2017-01-10 MED ORDER — TRAMADOL HCL 50 MG PO TABS: 50.0000 mg | ORAL_TABLET | Freq: Four times a day (QID) | ORAL | 0 refills | Status: DC | PRN

## 2017-01-10 NOTE — Telephone Encounter (Signed)
Spoke with pt, [facility] and it had not been called in. Reviewed with [name], script called to pharmacy.

## 2017-01-10 NOTE — Telephone Encounter (Signed)
Pt called to refill tramadol. He is taking about 2/day. It is controlling his pain. He wants rx sent to [facility] on [address].   This was forwarded to Dr Benay Spice to see if he wants to increase the quantity prescribed.

## 2017-01-13 ENCOUNTER — Ambulatory Visit (HOSPITAL_BASED_OUTPATIENT_CLINIC_OR_DEPARTMENT_OTHER): Payer: Medicare Other

## 2017-01-13 ENCOUNTER — Ambulatory Visit (HOSPITAL_BASED_OUTPATIENT_CLINIC_OR_DEPARTMENT_OTHER): Payer: Medicare Other | Admitting: Nurse Practitioner

## 2017-01-13 ENCOUNTER — Other Ambulatory Visit (HOSPITAL_BASED_OUTPATIENT_CLINIC_OR_DEPARTMENT_OTHER): Payer: Medicare Other

## 2017-01-13 ENCOUNTER — Telehealth: Payer: Self-pay | Admitting: Oncology

## 2017-01-13 ENCOUNTER — Other Ambulatory Visit: Payer: Self-pay | Admitting: *Deleted

## 2017-01-13 ENCOUNTER — Telehealth: Payer: Self-pay | Admitting: *Deleted

## 2017-01-13 ENCOUNTER — Ambulatory Visit: Payer: Medicare Other | Admitting: Nutrition

## 2017-01-13 ENCOUNTER — Telehealth: Payer: Self-pay | Admitting: Cardiology

## 2017-01-13 VITALS — BP 120/58 | HR 79 | Temp 97.9°F | Resp 18 | Ht 67.0 in | Wt 152.3 lb

## 2017-01-13 DIAGNOSIS — C179 Malignant neoplasm of small intestine, unspecified: Secondary | ICD-10-CM

## 2017-01-13 DIAGNOSIS — Z95828 Presence of other vascular implants and grafts: Secondary | ICD-10-CM

## 2017-01-13 DIAGNOSIS — Z8546 Personal history of malignant neoplasm of prostate: Secondary | ICD-10-CM

## 2017-01-13 DIAGNOSIS — Z5111 Encounter for antineoplastic chemotherapy: Secondary | ICD-10-CM

## 2017-01-13 DIAGNOSIS — G629 Polyneuropathy, unspecified: Secondary | ICD-10-CM

## 2017-01-13 LAB — COMPREHENSIVE METABOLIC PANEL
ALBUMIN: 2.6 g/dL — AB (ref 3.5–5.0)
ALK PHOS: 219 U/L — AB (ref 40–150)
ALT: 29 U/L (ref 0–55)
ANION GAP: 11 meq/L (ref 3–11)
AST: 76 U/L — AB (ref 5–34)
BILIRUBIN TOTAL: 0.44 mg/dL (ref 0.20–1.20)
BUN: 19.5 mg/dL (ref 7.0–26.0)
CALCIUM: 10.7 mg/dL — AB (ref 8.4–10.4)
CO2: 25 mEq/L (ref 22–29)
CREATININE: 0.8 mg/dL (ref 0.7–1.3)
Chloride: 103 mEq/L (ref 98–109)
EGFR: 79 mL/min/{1.73_m2} — ABNORMAL LOW (ref >90)
Glucose: 149 mg/dl — ABNORMAL HIGH (ref 70–140)
Potassium: 4.4 mEq/L (ref 3.5–5.1)
Sodium: 139 mEq/L (ref 136–145)
TOTAL PROTEIN: 6.9 g/dL (ref 6.4–8.3)

## 2017-01-13 LAB — CBC WITH DIFFERENTIAL/PLATELET
BASO%: 0.4 % (ref 0.0–2.0)
Basophils Absolute: 0 10*3/uL (ref 0.0–0.1)
EOS%: 0.1 % (ref 0.0–7.0)
Eosinophils Absolute: 0 10*3/uL (ref 0.0–0.5)
HEMATOCRIT: 30.3 % — AB (ref 38.4–49.9)
HEMOGLOBIN: 9.6 g/dL — AB (ref 13.0–17.1)
LYMPH#: 0.7 10*3/uL — AB (ref 0.9–3.3)
LYMPH%: 8.4 % — ABNORMAL LOW (ref 14.0–49.0)
MCH: 29.4 pg (ref 27.2–33.4)
MCHC: 31.7 g/dL — ABNORMAL LOW (ref 32.0–36.0)
MCV: 92.7 fL (ref 79.3–98.0)
MONO#: 0.7 10*3/uL (ref 0.1–0.9)
MONO%: 9.5 % (ref 0.0–14.0)
NEUT#: 6.3 10*3/uL (ref 1.5–6.5)
NEUT%: 81.6 % — ABNORMAL HIGH (ref 39.0–75.0)
PLATELETS: 206 10*3/uL (ref 140–400)
RBC: 3.27 10*6/uL — ABNORMAL LOW (ref 4.20–5.82)
RDW: 16 % — AB (ref 11.0–14.6)
WBC: 7.7 10*3/uL (ref 4.0–10.3)

## 2017-01-13 MED ORDER — SODIUM CHLORIDE 0.9 % IV SOLN
2400.0000 mg/m2 | INTRAVENOUS | Status: DC
  Administered 2017-01-13: 4500 mg via INTRAVENOUS
  Filled 2017-01-13: qty 90

## 2017-01-13 MED ORDER — SODIUM CHLORIDE 0.9% FLUSH
10.0000 mL | Freq: Once | INTRAVENOUS | Status: AC
  Administered 2017-01-13: 10 mL
  Filled 2017-01-13: qty 10

## 2017-01-13 MED ORDER — DEXAMETHASONE SODIUM PHOSPHATE 10 MG/ML IJ SOLN
10.0000 mg | Freq: Once | INTRAMUSCULAR | Status: AC
  Administered 2017-01-13: 10 mg via INTRAVENOUS

## 2017-01-13 MED ORDER — OXALIPLATIN CHEMO INJECTION 100 MG/20ML
80.0000 mg/m2 | Freq: Once | INTRAVENOUS | Status: AC
  Administered 2017-01-13: 150 mg via INTRAVENOUS
  Filled 2017-01-13: qty 20

## 2017-01-13 MED ORDER — PALONOSETRON HCL INJECTION 0.25 MG/5ML
0.2500 mg | Freq: Once | INTRAVENOUS | Status: AC
  Administered 2017-01-13: 0.25 mg via INTRAVENOUS

## 2017-01-13 MED ORDER — SODIUM CHLORIDE 0.9% FLUSH
10.0000 mL | INTRAVENOUS | Status: DC | PRN
  Filled 2017-01-13: qty 10

## 2017-01-13 MED ORDER — DEXAMETHASONE SODIUM PHOSPHATE 10 MG/ML IJ SOLN
INTRAMUSCULAR | Status: AC
  Filled 2017-01-13: qty 1

## 2017-01-13 MED ORDER — DEXTROSE 5 % IV SOLN
Freq: Once | INTRAVENOUS | Status: AC
  Administered 2017-01-13: 12:00:00 via INTRAVENOUS

## 2017-01-13 MED ORDER — PALONOSETRON HCL INJECTION 0.25 MG/5ML
INTRAVENOUS | Status: AC
  Filled 2017-01-13: qty 5

## 2017-01-13 MED ORDER — DEXTROSE 5 % IV SOLN
400.0000 mg/m2 | Freq: Once | INTRAVENOUS | Status: AC
  Administered 2017-01-13: 752 mg via INTRAVENOUS
  Filled 2017-01-13: qty 37.6

## 2017-01-13 NOTE — Telephone Encounter (Signed)
New message      Pt want to know "how important is it for pt to take lasix".  Pt is incontient and is peeing a lot.  The nurse at the cancer center told pt she would call and ask. Please call.

## 2017-01-13 NOTE — Telephone Encounter (Signed)
Called & left message for Dr Stanford Breed asking how important it is for pt to take lasix.  Pt c/o of freq urination & incontinence when taking.  Ned Card NP asked that we check with Dr Stanford Breed & see how critical it is for him to take.  Pt wants to know if dose can be adjusted or if he can take prn.

## 2017-01-13 NOTE — Telephone Encounter (Signed)
Returned call to [facility] with Dr.Sherrill's office.She stated patient was complaining today when he takes Lasix 40 mg he urinates frequently sometimes uncontrollably.He sometimes don't take Lasix at all.Advised I will send message to Dr.Crenshaw to see if ok to decrease dose.

## 2017-01-13 NOTE — Telephone Encounter (Signed)
2 bottles of contrast was given to patient with a copy of instructions, per CT ordered.

## 2017-01-13 NOTE — Progress Notes (Signed)
Nutrition follow-up completed with patient during radiation therapy for small bowel cancer. Weight decreased and documented as 152.3 pounds on May 17th, down from 158.4 pounds May third. Patient reports frequent stools 4-5 times a day but denies diarrhea. He is eating very little secondary to poor appetite. He tries to drink at least 2 ensure daily which he gets through the [place]. Dietary recall reveals patient can tolerate poached eggs, toast, waffles, yogurt and fruit.  Nutrition diagnosis: Unintended weight loss continues.  Intervention: Educated patient on strategies for increasing calories and protein at mealtimes. Recommended patient increase Ensure Plus 3-4 times daily between meals. Teach back method was used.  Monitoring, evaluation, goals: Patient will work to increase calories and protein to minimize weight loss.  Next visit: Thursday, May 31 during infusion.  **Disclaimer: This note was dictated with voice recognition software. Similar sounding words can inadvertently be transcribed and this note may contain transcription errors which may not have been corrected upon publication of note.**

## 2017-01-13 NOTE — Progress Notes (Signed)
  [place] OFFICE PROGRESS NOTE   Diagnosis:  Small bowel carcinoma  INTERVAL HISTORY:   [name] returns as scheduled. He completed cycle 3 FOLFOX 12/30/2016. He has mild intermittent nausea. No vomiting. No mouth sores. He is using Biotene mouth rinse. No diarrhea. He does report frequent bowel movements. No significant cold sensitivity. Stable intermittent numbness in the hands and feet. He reports a poor appetite. He fell yesterday. He attributes this to a step from his kitchen to the next room.  Objective:  Vital signs in last 24 hours:  Blood pressure (!) 120/58, pulse 79, temperature 97.9 F (36.6 C), temperature source Oral, resp. rate 18, height 5\' 7"  (1.702 m), weight 152 lb 4.8 oz (69.1 kg), SpO2 99 %.    HEENT: No thrush or ulcers. Resp: Lungs clear bilaterally. Cardio: Regular rate and rhythm. GI: Abdomen soft and nontender. No hepatomegaly. Vascular: No leg edema. Port-A-Cath without erythema.  Lab Results:  Lab Results  Component Value Date   WBC 7.7 01/13/2017   HGB 9.6 (L) 01/13/2017   HCT 30.3 (L) 01/13/2017   MCV 92.7 01/13/2017   PLT 206 01/13/2017   NEUTROABS 6.3 01/13/2017    Imaging:  No results found.  Medications: I have reviewed the patient's current medications.  Assessment/Plan: 1. Duodenal mass, status post a biopsy 04/30/2016 revealing a tubulovillous adenoma-high-grade dysplasia not identified  Upper endoscopy 10/28/2016 confirmed a duodenal mass surrounding the major papilla, nonobstructing, biopsy confirmed invasive adenocarcinoma  CT abdomen/pelvis 11/17/2016-multiple hepatic metastases progression of the proximal small bowel tumor, no biliary obstruction  Cycle 1 FOLFOX 12/02/2016  Cycle 2 FOLFOX 12/16/2016  Cycle 3 FOLFOX 12/30/2016  Cycle 4 FOLFOX 01/13/2017  2. Biliary obstruction secondary to #1, status post placement of an internal-external biliary drain on 05/01/2016, common bile duct stent  06/01/2016  3. Klebsiella bacteremia 04/26/2016 secondary to biliary obstruction  4. Anemia secondary to bacteremia and chronic disease  5. Right foot drop  6. Coronary artery disease  7. Arrhythmia-status post pacemaker placement  8. History prostate cancer treated with radiation  9. Irregular hyperpigmented mole at the parietal scalp  10. Mild hypercalcemia-he has discontinued calciumsupplement (takes TUMS for reflux)  11. Peripheral neuropathy-predating chemotherapy   Disposition: [name] appears stable. He has completed 3 cycles of FOLFOX. Plan to proceed with cycle 4 today as scheduled. We are referring him for restaging CT scans prior to his next visit.  He has had several falls due to a particular step in his house. We are making a referral for a home physical therapy evaluation.  He has lost some weight since his last visit. Appetite is poor. He will meet with the [place] dietitian today.  He will return for a follow-up visit in 2 weeks. He will contact the office in the interim with any problems.  Plan reviewed with [name].    Ned Card ANP/GNP-BC   01/13/2017  11:30 AM

## 2017-01-13 NOTE — Addendum Note (Signed)
Addended by: Betsy Coder B on: 01/13/2017 11:52 AM   Modules accepted: Orders

## 2017-01-13 NOTE — Patient Instructions (Signed)
[facility] Discharge Instructions for Patients Receiving Chemotherapy  Today you received the following chemotherapy agents:  Oxaliplatin, Leucovorin, Fluorouracil  To help prevent nausea and vomiting after your treatment, we encourage you to take your nausea medication as prescribed.   If you develop nausea and vomiting that is not controlled by your nausea medication, call the clinic.   BELOW ARE SYMPTOMS THAT SHOULD BE REPORTED IMMEDIATELY:  *FEVER GREATER THAN 100.5 F  *CHILLS WITH OR WITHOUT FEVER  NAUSEA AND VOMITING THAT IS NOT CONTROLLED WITH YOUR NAUSEA MEDICATION  *UNUSUAL SHORTNESS OF BREATH  *UNUSUAL BRUISING OR BLEEDING  TENDERNESS IN MOUTH AND THROAT WITH OR WITHOUT PRESENCE OF ULCERS  *URINARY PROBLEMS  *BOWEL PROBLEMS  UNUSUAL RASH Items with * indicate a potential emergency and should be followed up as soon as possible.  Feel free to call the clinic you have any questions or concerns. The clinic phone number is [phone].  Please show the CHEMO ALERT CARD at check-in to the Emergency Department and triage nurse.   

## 2017-01-13 NOTE — Telephone Encounter (Signed)
Lab, Flush, Chemo and follow up with Dr Benay Spice was scheduled for 02/10/17, with Pump D/C on 02/12/17, per 01/13/17 los. Patient was given a copy of the AVS report and appointment schedule, per 01/13/17 los.

## 2017-01-13 NOTE — Telephone Encounter (Signed)
I dont think I put him on lasix; if someone else did, would direct to them. [name]

## 2017-01-14 NOTE — Telephone Encounter (Signed)
Left message on voice mail for dr sherrill's nurse to contact ordering provider.

## 2017-01-14 NOTE — Telephone Encounter (Addendum)
Message from [place] at Dr. Jacalyn Lefevre office reporting cardiologist did not prescribe Lasix. Recommends we "check with whoever prescribed it." Will review with provider, as it is unclear who prescribed lasix.

## 2017-01-15 ENCOUNTER — Ambulatory Visit (HOSPITAL_BASED_OUTPATIENT_CLINIC_OR_DEPARTMENT_OTHER): Payer: Self-pay

## 2017-01-15 VITALS — BP 138/51 | HR 81 | Temp 98.6°F | Resp 17

## 2017-01-15 DIAGNOSIS — C179 Malignant neoplasm of small intestine, unspecified: Secondary | ICD-10-CM

## 2017-01-15 MED ORDER — HEPARIN SOD (PORK) LOCK FLUSH 100 UNIT/ML IV SOLN
500.0000 [IU] | Freq: Once | INTRAVENOUS | Status: AC | PRN
  Administered 2017-01-15: 500 [IU]
  Filled 2017-01-15: qty 5

## 2017-01-15 MED ORDER — SODIUM CHLORIDE 0.9% FLUSH
10.0000 mL | INTRAVENOUS | Status: DC | PRN
  Administered 2017-01-15: 10 mL
  Filled 2017-01-15: qty 10

## 2017-01-15 NOTE — Patient Instructions (Signed)

## 2017-01-17 ENCOUNTER — Encounter: Payer: Self-pay | Admitting: Oncology

## 2017-01-17 ENCOUNTER — Telehealth: Payer: Self-pay

## 2017-01-17 ENCOUNTER — Ambulatory Visit (HOSPITAL_BASED_OUTPATIENT_CLINIC_OR_DEPARTMENT_OTHER): Payer: Medicare Other | Admitting: Oncology

## 2017-01-17 ENCOUNTER — Ambulatory Visit (HOSPITAL_BASED_OUTPATIENT_CLINIC_OR_DEPARTMENT_OTHER): Payer: Medicare Other

## 2017-01-17 DIAGNOSIS — R531 Weakness: Secondary | ICD-10-CM | POA: Diagnosis not present

## 2017-01-17 DIAGNOSIS — C179 Malignant neoplasm of small intestine, unspecified: Secondary | ICD-10-CM

## 2017-01-17 DIAGNOSIS — R300 Dysuria: Secondary | ICD-10-CM

## 2017-01-17 DIAGNOSIS — R1011 Right upper quadrant pain: Secondary | ICD-10-CM

## 2017-01-17 DIAGNOSIS — R5081 Fever presenting with conditions classified elsewhere: Secondary | ICD-10-CM

## 2017-01-17 DIAGNOSIS — R109 Unspecified abdominal pain: Secondary | ICD-10-CM | POA: Insufficient documentation

## 2017-01-17 LAB — CBC WITH DIFFERENTIAL/PLATELET
BASO%: 0.6 % (ref 0.0–2.0)
BASOS ABS: 0 10*3/uL (ref 0.0–0.1)
EOS ABS: 0 10*3/uL (ref 0.0–0.5)
EOS%: 0.5 % (ref 0.0–7.0)
HCT: 29.7 % — ABNORMAL LOW (ref 38.4–49.9)
HGB: 9.6 g/dL — ABNORMAL LOW (ref 13.0–17.1)
LYMPH%: 22.4 % (ref 14.0–49.0)
MCH: 29.5 pg (ref 27.2–33.4)
MCHC: 32.4 g/dL (ref 32.0–36.0)
MCV: 91.2 fL (ref 79.3–98.0)
MONO#: 0.1 10*3/uL (ref 0.1–0.9)
MONO%: 2.1 % (ref 0.0–14.0)
NEUT#: 3.2 10*3/uL (ref 1.5–6.5)
NEUT%: 74.4 % (ref 39.0–75.0)
Platelets: 273 10*3/uL (ref 140–400)
RBC: 3.26 10*6/uL — ABNORMAL LOW (ref 4.20–5.82)
RDW: 16.4 % — AB (ref 11.0–14.6)
WBC: 4.3 10*3/uL (ref 4.0–10.3)
lymph#: 1 10*3/uL (ref 0.9–3.3)

## 2017-01-17 LAB — COMPREHENSIVE METABOLIC PANEL
ALBUMIN: 2.5 g/dL — AB (ref 3.5–5.0)
ALT: 34 U/L (ref 0–55)
AST: 74 U/L — AB (ref 5–34)
Alkaline Phosphatase: 204 U/L — ABNORMAL HIGH (ref 40–150)
Anion Gap: 9 mEq/L (ref 3–11)
BUN: 18.2 mg/dL (ref 7.0–26.0)
CO2: 28 mEq/L (ref 22–29)
Calcium: 11 mg/dL — ABNORMAL HIGH (ref 8.4–10.4)
Chloride: 103 mEq/L (ref 98–109)
Creatinine: 0.7 mg/dL (ref 0.7–1.3)
EGFR: 83 mL/min/{1.73_m2} — ABNORMAL LOW (ref >90)
GLUCOSE: 115 mg/dL (ref 70–140)
POTASSIUM: 4.7 meq/L (ref 3.5–5.1)
SODIUM: 140 meq/L (ref 136–145)
Total Bilirubin: 0.43 mg/dL (ref 0.20–1.20)
Total Protein: 6.6 g/dL (ref 6.4–8.3)

## 2017-01-17 LAB — URINALYSIS, MICROSCOPIC - CHCC
Bilirubin (Urine): NEGATIVE
Blood: NEGATIVE
Glucose: NEGATIVE mg/dL
Ketones: NEGATIVE mg/dL
Nitrite: NEGATIVE
Protein: <30 mg/dL
RBC / HPF: NEGATIVE (ref 0–2)
Specific Gravity, Urine: 1.01 (ref 1.003–1.035)
UROBILINOGEN UR: 0.2 mg/dL (ref 0.2–1)
pH: 7.5 (ref 4.6–8.0)

## 2017-01-17 MED ORDER — ZOLEDRONIC ACID 4 MG/100ML IV SOLN
4.0000 mg | Freq: Once | INTRAVENOUS | Status: AC
  Administered 2017-01-17: 4 mg via INTRAVENOUS
  Filled 2017-01-17: qty 100

## 2017-01-17 MED ORDER — SODIUM CHLORIDE 0.9 % IV SOLN
Freq: Once | INTRAVENOUS | Status: AC
  Administered 2017-01-17: 15:00:00 via INTRAVENOUS

## 2017-01-17 MED ORDER — SODIUM CHLORIDE 0.9 % IJ SOLN
10.0000 mL | Freq: Once | INTRAMUSCULAR | Status: AC
  Administered 2017-01-17: 10 mL
  Filled 2017-01-17: qty 10

## 2017-01-17 MED ORDER — HYDROCODONE-ACETAMINOPHEN 5-325 MG PO TABS: 1.0000 | ORAL_TABLET | Freq: Four times a day (QID) | ORAL | 0 refills | Status: DC | PRN

## 2017-01-17 MED ORDER — HEPARIN SOD (PORK) LOCK FLUSH 100 UNIT/ML IV SOLN
500.0000 [IU] | Freq: Once | INTRAVENOUS | Status: AC
  Administered 2017-01-17: 500 [IU]
  Filled 2017-01-17: qty 5

## 2017-01-17 NOTE — Telephone Encounter (Signed)
Pt called stating he has sx of UTI with frequency and burning this weekend. He spiked a fever Sunday afternoon of 101.something. He had chills at that time. He called urologist this morning requesting they send someone out to collect a urine specimen, and they don't do that any more. He states he is too weak to get to [facility], and too sore in his right side over the liver. He denies SOB.   Last folfox 5/17-5/19, CT due 5/29 with f/u visit 5/31  S/w Dr Benay Spice request pt come to [facility] or ER since he is so weak. Too many variables to treat him over the phone.   Pt stated he will call grandson and try to get here. Gave him timeframe of 1230 for lab. In basket sent and cbc, cmet, ua w/culture ordered.

## 2017-01-17 NOTE — Progress Notes (Signed)
SYMPTOM MANAGEMENT CLINIC    Chief Complaint: Weakness and abdominal pain  HPI:  [name] 83 y.o. male diagnosed with stage IV colorectal carcinoma currently being treated with FOLFOX. He is status post 4 cycles of his chemotherapy. The patient comes into the symptom management clinic today for increased weakness and abdominal discomfort. The patient is accompanied by his grandson and wife to the visit today. The patient reports that he has been weak since this past Sunday and has been more frequently incontinent of urine. He reports having some burning with urination. He has been having right-sided abdominal pain over the weekend. He rates his pain 8-9 out of 10. He uses tramadol with minimal relief. He denies any nausea or vomiting. He does report constipation and has to strain to have a bowel movement. He does use Senokot and Dulcolax when needed. He has not noted any blood in his stool. The patient reports that he developed a fever with chills up to 101. He took Tylenol which brought the fever down. He has been using Tylenol around the clock since then. He denies cough and shortness of breath. Denies chest pain. He has neuropathy in his feet related to his chemotherapy. He states that the neuropathy makes it difficult for him to walk.    No history exists.    Review of Systems  Constitutional: Positive for chills, fever, malaise/fatigue and weight loss. Negative for diaphoresis.  HENT: Negative.   Eyes: Negative.   Respiratory: Negative.   Cardiovascular: Negative.   Gastrointestinal: Positive for abdominal pain, constipation and heartburn. Negative for blood in stool, diarrhea, melena, nausea and vomiting.  Genitourinary: Positive for dysuria. Negative for flank pain, frequency, hematuria and urgency.  Musculoskeletal: Negative.   Skin: Negative.   Neurological: Positive for weakness. Negative for dizziness, tremors, sensory change, focal weakness and headaches.    Endo/Heme/Allergies: Negative.   Psychiatric/Behavioral: Negative.     Past Medical History:  Diagnosis Date  . Anemia 2014   was on iron pills and then was able to come off  . Arthritis    "all over"  . Asthma       . Atrial flutter ([place])    a. diagnosed 03/2015  . Bacteremia 03/2016  . Complication of anesthesia    B/P dropped low and had to stay in recovery longer  . Coronary artery disease    a. s/p CABG 1986 b. redo CABG 1993 c. PTCA 2008 d. DES 2010  . Depression       . Diverticulosis   . GERD (gastroesophageal reflux disease)       . Hepatitis    hepatitis- long time ago, occupational contamination   . Hiatal hernia   . History of kidney stones   . Hyperlipemia   . Hypertension       . Insomnia       . Myocardial infarction ([place]) LAST 2010   MI 1986 WITH CABG DONE  . On home oxygen therapy    "2L; just at night" (01/31/2015)  . Peripheral neuropathy    TOES BOTH FEET  . Peripheral vascular disease (HCC)    thrombus- in leg- many yrs. ago- ?R- coumadin x1 mth  . Pneumonia 1960's AND FALL 2017  . Presence of permanent cardiac pacemaker   . Prostate cancer [facility])    radiation therapy- 2002  . Systolic CHF ([place])       . VT (ventricular tachycardia) ([place]) 06/2012   a. in the setting of STEMI b.  on amio during hospitalization    Past Surgical History:  Procedure Laterality Date  . APPENDECTOMY  1943  . BILIARY DRAINAGE  05/14/2016  . CATARACT EXTRACTION W/ INTRAOCULAR LENS  IMPLANT, BILATERAL    . COLONOSCOPY    . CORONARY ARTERY BYPASS GRAFT  1986;  1993   CABG X 5; CABG X 1  . CYSTOSCOPY W/ URETERAL STENT PLACEMENT Right 02/28/2013   Procedure: CYSTOSCOPY WITH RETROGRADE PYELOGRAM/URETERAL STENT PLACEMENT;  Surgeon: Dutch Gray, MD;  Location: [facility];  Service: Urology;  Laterality: Right;  . CYSTOSCOPY WITH URETEROSCOPY AND STENT PLACEMENT Right 03/23/2013   Procedure: RIGHT URETEROSCOPY, LASER LITHO AND STENT PLACEMENT;  Surgeon: Fredricka Bonine,  MD;  Location: [facility];  Service: Urology;  Laterality: Right;  . EP IMPLANTABLE DEVICE N/A 04/11/2015   Procedure: Pacemaker Implant;  Surgeon: Will Meredith Leeds, MD;  Location: [place] CV LAB;  Service: Cardiovascular;  Laterality: N/A;  . ERCP N/A 04/30/2016   Procedure: ENDOSCOPIC RETROGRADE CHOLANGIOPANCREATOGRAPHY (ERCP);  Surgeon: Milus Banister, MD;  Location: [place];  Service: Endoscopy;  Laterality: N/A;  . ESOPHAGOGASTRODUODENOSCOPY (EGD) WITH PROPOFOL N/A 10/28/2016   Procedure: ESOPHAGOGASTRODUODENOSCOPY (EGD) WITH PROPOFOL;  Surgeon: Milus Banister, MD;  [facility];  Service: Endoscopy;  Laterality: N/A;  . FOOT FUSION Left 2000   heel  . FRACTURE SURGERY     heel- crushed -2002,  (hardware)   . HAMMER TOE SURGERY Right    little toe  . HARDWARE REMOVAL Right 2002   great toe; Dr. Sharol Given  . HOLMIUM LASER APPLICATION Right 01/24/7823   Procedure: HOLMIUM LASER APPLICATION;  Surgeon: Fredricka Bonine, MD;  Location[facility];  Service: Urology;  Laterality: Right;  . INGUINAL HERNIA REPAIR Left 01/31/2015   Procedure: OPEN REPAIR RECURRENT LEFT INGUINAL HERNIA;  Surgeon: Fanny Skates, MD;  Location: [place];  Service: General;  Laterality: Left;  . INSERTION OF MESH Left 01/31/2015   Procedure: INSERTION OF MESH;  Surgeon: Fanny Skates, MD;  Location: [place];  Service: General;  Laterality: Left;  . INTRAMEDULLARY (IM) NAIL INTERTROCHANTERIC Right 08/29/2013   Procedure: INTRAMEDULLARY (IM) NAIL INTERTROCHANTRIC;  Surgeon: Marianna Payment, MD;  Location: [place];  Service: Orthopedics;  Laterality: Right;  . IR FLUORO GUIDE PORT INSERTION RIGHT  11/29/2016  . IR GENERIC HISTORICAL  05/01/2016   IR INT EXT BILIARY DRAIN WITH CHOLANGIOGRAM 05/01/2016 Corrie Mckusick, DO [facility]  . IR GENERIC HISTORICAL  06/01/2016   IR BILIARY STENT(S) EXISTING ACCESS INC DILATION CATH EXCHANGE 06/01/2016 Corrie Mckusick, DO [facility]  . IR GENERIC HISTORICAL  05/20/2016   IR  RADIOLOGIST EVAL & MGMT 05/20/2016 Greggory Keen, MD [facility]  . IR US GUIDE VASC ACCESS RIGHT  11/29/2016  . JOINT REPLACEMENT     R great toe  . KNEE ARTHROSCOPY Right X 2  . LEFT AND RIGHT HEART CATHETERIZATION WITH CORONARY ANGIOGRAM N/A 07/15/2012   Procedure: LEFT AND RIGHT HEART CATHETERIZATION WITH CORONARY ANGIOGRAM;  Surgeon: Jettie Booze, MD;  Location: [facility] CATH LAB;  Service: Cardiovascular;  Laterality: N/A;  . LUMBAR LAMINECTOMY/DECOMPRESSION MICRODISCECTOMY  08/10/2011   Procedure: LUMBAR LAMINECTOMY/DECOMPRESSION MICRODISCECTOMY;  Surgeon: Cooper Render Pool;  Location: [place] NEURO ORS;  Service: Neurosurgery;  Laterality: Right;  RIGHT Lumbar five-sacral one LAMINECTOMY, MICRODISCECTOMY  . LUMBAR WOUND DEBRIDEMENT  08/22/2011   Procedure: LUMBAR WOUND DEBRIDEMENT;  Surgeon: Eustace Moore;  Location: [place] NEURO ORS;  Service: Neurosurgery;  Laterality: N/A;  Repair of CSF Leak  requiring laminectomy  . PERCUTANEOUS CORONARY STENT INTERVENTION (PCI-S) N/A 07/15/2012   Procedure: PERCUTANEOUS CORONARY STENT INTERVENTION (PCI-S);  Surgeon: Jettie Booze, MD;  Location: [facility] CATH LAB;  Service: Cardiovascular;  Laterality: N/A;  . RHINOPLASTY  [id number]  . RIGHT HIP PINNING   5 YRS AGO   DR SUE  . SQUAMOUS CELL AREA REMOVED FROM LEFT LOWER ARM  DEC 2017 AND MORE REMOVED 10-18-16 AND AREA FROM RIGHT EAR REMOVED IN PAST  . TEE WITHOUT CARDIOVERSION N/A 04/28/2016   Procedure: TRANSESOPHAGEAL ECHOCARDIOGRAM (TEE);  Surgeon: Skeet Latch, MD;  Location: [facility] ENDOSCOPY;  Service: Cardiovascular;  Laterality: N/A;  . TONSILLECTOMY  1940  . TRANSURETHRAL RESECTION OF PROSTATE  x2    has Essential hypertension; Coronary artery disease due to lipid rich plaque; HERNIA; PSORIASIS; OSTEOARTHRITIS; OSTEOPOROSIS; Ischemic heart disease; Myocardial infarction ([place]); Acute systolic congestive heart failure, NYHA class 2-EF 40-45%; Chronic respiratory failure ([place]); Chronic asthma; Peripheral  neuropathy; Dyslipidemia (high LDL; low HDL); LBBB (left bundle branch block); Hx of CABG and re-do CABG ; Foot drop, right; Acute MI, anterolateral wall ([place]); Prostatitis; E. coli UTI (urinary tract infection); Elevated transaminase level; Glucose intolerance (impaired glucose tolerance); Normocytic anemia; Junctional bradycardia; Intertrochanteric fracture of right hip ([place]); Hypertension; GERD (gastroesophageal reflux disease); COPD (chronic obstructive pulmonary disease) ([place]); H/O cardiac arrest; Systolic CHF ([place]); Depression; Urinary retention due to benign prostatic hyperplasia; Atrial flutter, RVR; Unstable angina ([place]); Coronary artery disease involving native coronary artery of native heart without angina pectoris; Pacemaker; SOB (shortness of breath); Insomnia; Bacteremia due to Gram-negative bacteria; BPH (benign prostatic hyperplasia); Asthma, mild intermittent; Elevated liver function tests; Common bile duct obstruction; Malnutrition of moderate degree; Peri-ampullary neoplasm; Cholangitis; Duodenal mass; Fever; Pyrexia; Biliary drain displacement; Chronic systolic congestive heart failure ([place]); Chronic bronchitis ([place]); Common bile duct mass; Chronic anemia; Cough; HCAP (healthcare-associated pneumonia); Loculated pleural effusion; Pleural effusion on right; Acute on chronic respiratory failure with hypoxemia ([place]); Goals of care, counseling/discussion; 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    has No Known Allergies.  Allergies as of 01/17/2017   No Known Allergies     Medication List       Accurate as of 01/17/17  2:55 PM. Always use your most recent med list.          acetaminophen 500 MG tablet Commonly known as:  TYLENOL Take 1,000 mg by mouth every 6 (six) hours as needed for mild pain, moderate pain or fever.   albuterol 108 (90 Base) MCG/ACT inhaler Commonly known as:  PROVENTIL HFA;VENTOLIN HFA Inhale 2 puffs into the lungs every 6  (six) hours as needed for wheezing or shortness of breath.   aspirin 81 MG EC tablet Take 1 tablet (81 mg total) by mouth daily.   budesonide-formoterol 80-4.5 MCG/ACT inhaler Commonly known as:  SYMBICORT Inhale 2 puffs into the lungs 2 (two) times daily.   carbamazepine 100 MG chewable tablet Commonly known as:  TEGRETOL Chew 150 mg by mouth 2 (two) times daily.   docusate sodium 100 MG capsule Commonly known as:  COLACE Take 100 mg by mouth at bedtime.   feeding supplement (ENSURE ENLIVE) Liqd Take 237 mLs by mouth 2 (two) times daily between meals.   feeding supplement (PRO-STAT SUGAR FREE 64) Liqd Take 30 mLs by mouth 2 (two) times daily.   ferrous sulfate 325 (65 FE) MG EC tablet Take 325 mg by mouth 3 (three) times daily with meals.   finasteride 5 MG  tablet Commonly known as:  PROSCAR Take 5 mg by mouth every morning.   folic acid 568 MCG tablet Commonly known as:  FOLVITE Take 400 mcg by mouth daily.   furosemide 40 MG tablet Commonly known as:  LASIX Take 40 mg by mouth daily.   HYDROcodone-acetaminophen 5-325 MG tablet Commonly known as:  NORCO/VICODIN Take 1 tablet by mouth every 6 (six) hours as needed.   ipratropium 0.02 % nebulizer solution Commonly known as:  ATROVENT Take 2.5 mLs (0.5 mg total) by nebulization 2 (two) times daily as needed for wheezing or shortness of breath.   ipratropium 17 MCG/ACT inhaler Commonly known as:  ATROVENT HFA Inhale 2 puffs into the lungs 2 (two) times daily as needed for wheezing.   ipratropium-albuterol 0.5-2.5 (3) MG/3ML Soln Commonly known as:  DUONEB Take 3 mLs by nebulization every 6 (six) hours as needed.   isosorbide mononitrate 30 MG 24 hr tablet Commonly known as:  IMDUR Take 1 tablet (30 mg total) by mouth daily.   lidocaine-prilocaine cream Commonly known as:  EMLA Apply cream to port one hour prior to port access and cover with plastic wrap   Melatonin 3 MG Tabs Take 3 mg by mouth at  bedtime.   metoprolol succinate 12.5 mg Tb24 24 hr tablet Commonly known as:  TOPROL-XL Take 12.5 mg by mouth daily.   nitroGLYCERIN 0.4 MG SL tablet Commonly known as:  NITROSTAT Place 1 tablet (0.4 mg total) under the tongue every 5 (five) minutes as needed for chest pain (As needed for chest pain).   omeprazole 20 MG capsule Commonly known as:  PRILOSEC Take 20 mg by mouth daily.   OXYGEN Inhale 2 L/min into the lungs at bedtime.   potassium chloride SA 20 MEQ tablet Commonly known as:  K-DUR,KLOR-CON Take 20 mEq by mouth daily with lunch.   prochlorperazine 5 MG tablet Commonly known as:  COMPAZINE TAKE 1 TABLET BY MOUTH EVERY 6 HOURS AS NEEDED FOR NAUSEA OR VOMITING   ranitidine 150 MG tablet Commonly known as:  ZANTAC Take 1 tablet (150 mg total) by mouth 2 (two) times daily.   senna 8.6 MG tablet Commonly known as:  SENOKOT Take 1 tablet by mouth daily.   sertraline 100 MG tablet Commonly known as:  ZOLOFT Take 150 mg by mouth every morning.   tamsulosin 0.4 MG Caps capsule Commonly known as:  FLOMAX Take 1 capsule (0.4 mg total) by mouth daily.   traMADol 50 MG tablet Commonly known as:  ULTRAM Take 1 tablet (50 mg total) by mouth every 6 (six) hours as needed.   traZODone 50 MG tablet Commonly known as:  DESYREL TAKE ONE TABLET BY MOUTH AT BEDTIME        PHYSICAL EXAMINATION  Oncology Vitals 01/17/2017 01/15/2017  Height 170 cm -  Weight 65.772 kg -  Weight (lbs) 145 lbs -  BMI (kg/m2) 22.71 kg/m2 -  Temp 98.2 98.6  Pulse 85 81  Resp 18 17  SpO2 98 97  BSA (m2) 1.76 m2 -   BP Readings from Last 2 Encounters:  01/17/17 (!) 145/74  01/15/17 (!) 138/51    Physical Exam  Constitutional: He is oriented to person, place, and time. No distress.  Chronically ill appearing older gentleman.  HENT:  Head: Normocephalic.  Mouth/Throat: Oropharynx is clear and moist. No oropharyngeal exudate.  Eyes: Conjunctivae are normal. No scleral icterus.   Neck: Normal range of motion. No tracheal deviation present.  Cardiovascular: Normal rate, regular  rhythm and intact distal pulses.   Pulmonary/Chest: Effort normal and breath sounds normal. No respiratory distress. He has no wheezes. He has no rales.  Abdominal: Soft. Bowel sounds are normal. There is tenderness. There is no rebound and no guarding.  Tenderness with light palpation over the right upper quadrant.  Musculoskeletal: Normal range of motion. He exhibits no edema.  Lymphadenopathy:    He has no cervical adenopathy.  Neurological: He is alert and oriented to person, place, and time.  Skin: Skin is warm and dry. No rash noted. No erythema. No pallor.  Psychiatric: Mood, memory, affect and judgment normal.  Vitals reviewed.   LABORATORY DATA:. Appointment on 01/17/2017  Component Date Value Ref Range Status  . WBC 01/17/2017 4.3  4.0 - 10.3 10e3/uL Final  . NEUT# 01/17/2017 3.2  1.5 - 6.5 10e3/uL Final  . HGB 01/17/2017 9.6* 13.0 - 17.1 g/dL Final  . HCT 01/17/2017 29.7* 38.4 - 49.9 % Final  . Platelets 01/17/2017 273  140 - 400 10e3/uL Final  . MCV 01/17/2017 91.2  79.3 - 98.0 fL Final  . MCH 01/17/2017 29.5  27.2 - 33.4 pg Final  . MCHC 01/17/2017 32.4  32.0 - 36.0 g/dL Final  . RBC 01/17/2017 3.26* 4.20 - 5.82 10e6/uL Final  . RDW 01/17/2017 16.4* 11.0 - 14.6 % Final  . lymph# 01/17/2017 1.0  0.9 - 3.3 10e3/uL Final  . MONO# 01/17/2017 0.1  0.1 - 0.9 10e3/uL Final  . Eosinophils Absolute 01/17/2017 0.0  0.0 - 0.5 10e3/uL Final  . Basophils Absolute 01/17/2017 0.0  0.0 - 0.1 10e3/uL Final  . NEUT% 01/17/2017 74.4  39.0 - 75.0 % Final  . LYMPH% 01/17/2017 22.4  14.0 - 49.0 % Final  . MONO% 01/17/2017 2.1  0.0 - 14.0 % Final  . EOS% 01/17/2017 0.5  0.0 - 7.0 % Final  . BASO% 01/17/2017 0.6  0.0 - 2.0 % Final  . Sodium 01/17/2017 140  136 - 145 mEq/L Final  . Potassium 01/17/2017 4.7  3.5 - 5.1 mEq/L Final  . Chloride 01/17/2017 103  98 - 109 mEq/L Final  . CO2  01/17/2017 28  22 - 29 mEq/L Final  . Glucose 01/17/2017 115  70 - 140 mg/dl Final   Glucose reference range is for nonfasting patients. Fasting glucose reference range is 70- 100.  [name] BUN 01/17/2017 18.2  7.0 - 26.0 mg/dL Final  . Creatinine 01/17/2017 0.7  0.7 - 1.3 mg/dL Final  . Total Bilirubin 01/17/2017 0.43  0.20 - 1.20 mg/dL Final  . Alkaline Phosphatase 01/17/2017 204* 40 - 150 U/L Final  . AST 01/17/2017 74* 5 - 34 U/L Final  . ALT 01/17/2017 34  0 - 55 U/L Final  . Total Protein 01/17/2017 6.6  6.4 - 8.3 g/dL Final  . Albumin 01/17/2017 2.5* 3.5 - 5.0 g/dL Final  . Calcium 01/17/2017 11.0* 8.4 - 10.4 mg/dL Final  . Anion Gap 01/17/2017 9  3 - 11 mEq/L Final  . EGFR 01/17/2017 83* >90 ml/min/1.73 m2 Final   eGFR is calculated using the CKD-EPI Creatinine Equation (2009)  . Glucose 01/17/2017 Negative  Negative mg/dL Final  . Bilirubin (Urine) 01/17/2017 Negative  Negative Final  . Ketones 01/17/2017 Negative  Negative mg/dL Final  . Specific Gravity, Urine 01/17/2017 1.010  1.003 - 1.035 Final  . Blood 01/17/2017 Negative  Negative Final  . pH 01/17/2017 7.5  4.6 - 8.0 Final  . Protein 01/17/2017 < 30  Negative- <30 mg/dL Final  .  Urobilinogen, UR 01/17/2017 0.2  0.2 - 1 mg/dL Final  . Nitrite 01/17/2017 Negative  Negative Final  . Leukocyte Esterase 01/17/2017 Trace  Negative Final  . RBC / HPF 01/17/2017 Negative  0 - 2 Final  . WBC, UA 01/17/2017 0-2  0-2;Negative Final  . Bacteria, UA 01/17/2017 Few  Negative- Trace Final  . Epithelial Cells 01/17/2017 Occasional  Negative- Few Final  . Amorphous, UA 01/17/2017 Moderate  Negative- Small Final  . COMMENTS: 01/17/2017 Less than 10 mls submitted for testing.  Negative Final    RADIOGRAPHIC STUDIES: No results found.  ASSESSMENT/PLAN:    No problem-specific Assessment & Plan notes found for this encounter.  This is an 83 year old gentleman with stage IV colorectal cancer. He is status post 4 cycles of FOLFOX. He  presents today with increased weakness, fever, and abdominal pain. A urinalysis was checked it did not show overwhelming evidence of a UTI. The urine will be sent for urine culture. White blood cell count was normal today.  The patient was seen and examined with [name]. Discussed that his fever may be due to his cancer. As previously discussed, the patient will take Tylenol in approximately 4-5 PM every evening. Tramadol is not controlling his pain, and he was given a prescription for hydrocodone to be used every 6 hours as needed for pain. Discussed that this medication does have Tylenol and it patient should take Tylenol or hydrocodone, but not both.  Labs demonstrate hypercalcemia of malignancy. His corrected calcium is approximately 12.2. The patient will receive 500 mL of normal saline here in our office along with Zometa 4 mg. He was instructed to hydrate his much as possible at home.  The patient has restaging CT scan scheduled for early next week. We discussed with the patient and his family that if he feels worse prior to his next visit with [place], he should call our office and we will be happy to see him sooner.  Patient stated understanding of all instructions; and was in agreement with this plan of care. The patient knows to call the clinic with any problems, questions or concerns.    Mikey Bussing, NP 01/17/2017  This was a shared visit with Mikey Bussing. Mr. Trudie Buckler was interviewed and examined. His performance status has declined. I am concerned the fever, pain, and hypercalcemia are related to progression of the small bowel carcinoma. We discussed the situation with his family. He will receive intravenous fluids and Zometa today. We will follow-up on the urinalysis/culture to be sure he does not have a urinary tract infection. He will undergo restaging CTs next week. I will recommend a Hospice referral if the CTs confirmed disease progression.  Julieanne Manson, M.D.

## 2017-01-17 NOTE — Patient Instructions (Signed)
Zoledronic Acid injection (Hypercalcemia, Oncology) What is this medicine? ZOLEDRONIC ACID (ZOE le dron ik AS id) lowers the amount of calcium loss from bone. It is used to treat too much calcium in your blood from cancer. It is also used to prevent complications of cancer that has spread to the bone. This medicine may be used for other purposes; ask your health care provider or pharmacist if you have questions. COMMON BRAND NAME(S): Zometa What should I tell my health care provider before I take this medicine? They need to know if you have any of these conditions: -aspirin-sensitive asthma -cancer, especially if you are receiving medicines used to treat cancer -dental disease or wear dentures -infection -kidney disease -receiving corticosteroids like dexamethasone or prednisone -an unusual or allergic reaction to zoledronic acid, other medicines, foods, dyes, or preservatives -pregnant or trying to get pregnant -breast-feeding How should I use this medicine? This medicine is for infusion into a vein. It is given by a health care professional in a hospital or clinic setting. Talk to your pediatrician regarding the use of this medicine in children. Special care may be needed. Overdosage: If you think you have taken too much of this medicine contact a poison control center or emergency room at once. NOTE: This medicine is only for you. Do not share this medicine with others. What if I miss a dose? It is important not to miss your dose. Call your doctor or health care professional if you are unable to keep an appointment. What may interact with this medicine? -certain antibiotics given by injection -NSAIDs, medicines for pain and inflammation, like ibuprofen or naproxen -some diuretics like bumetanide, furosemide -teriparatide -thalidomide This list may not describe all possible interactions. Give your health care provider a list of all the medicines, herbs, non-prescription drugs, or  dietary supplements you use. Also tell them if you smoke, drink alcohol, or use illegal drugs. Some items may interact with your medicine. What should I watch for while using this medicine? Visit your doctor or health care professional for regular checkups. It may be some time before you see the benefit from this medicine. Do not stop taking your medicine unless your doctor tells you to. Your doctor may order blood tests or other tests to see how you are doing. Women should inform their doctor if they wish to become pregnant or think they might be pregnant. There is a potential for serious side effects to an unborn child. Talk to your health care professional or pharmacist for more information. You should make sure that you get enough calcium and vitamin D while you are taking this medicine. Discuss the foods you eat and the vitamins you take with your health care professional. Some people who take this medicine have severe bone, joint, and/or muscle pain. This medicine may also increase your risk for jaw problems or a broken thigh bone. Tell your doctor right away if you have severe pain in your jaw, bones, joints, or muscles. Tell your doctor if you have any pain that does not go away or that gets worse. Tell your dentist and dental surgeon that you are taking this medicine. You should not have major dental surgery while on this medicine. See your dentist to have a dental exam and fix any dental problems before starting this medicine. Take good care of your teeth while on this medicine. Make sure you see your dentist for regular follow-up appointments. What side effects may I notice from receiving this medicine? Side effects that   you should report to your doctor or health care professional as soon as possible: -allergic reactions like skin rash, itching or hives, swelling of the face, lips, or tongue -anxiety, confusion, or depression -breathing problems -changes in vision -eye pain -feeling faint or  lightheaded, falls -jaw pain, especially after dental work -mouth sores -muscle cramps, stiffness, or weakness -redness, blistering, peeling or loosening of the skin, including inside the mouth -trouble passing urine or change in the amount of urine Side effects that usually do not require medical attention (report to your doctor or health care professional if they continue or are bothersome): -bone, joint, or muscle pain -constipation -diarrhea -fever -hair loss -irritation at site where injected -loss of appetite -nausea, vomiting -stomach upset -trouble sleeping -trouble swallowing -weak or tired This list may not describe all possible side effects. Call your doctor for medical advice about side effects. You may report side effects to FDA at 1-800-FDA-1088. Where should I keep my medicine? This drug is given in a hospital or clinic and will not be stored at home. NOTE: This sheet is a summary. It may not cover all possible information. If you have questions about this medicine, talk to your doctor, pharmacist, or health care provider.  2018 Elsevier/Gold Standard (2014-01-12 14:19:39)  Dehydration, Adult Dehydration is when there is not enough fluid or water in your body. This happens when you lose more fluids than you take in. Dehydration can range from mild to very bad. It should be treated right away to keep it from getting very bad. Symptoms of mild dehydration may include:   Thirst.  Dry lips.  Slightly dry mouth.  Dry, warm skin.  Dizziness. Symptoms of moderate dehydration may include:   Very dry mouth.  Muscle cramps.  Dark pee (urine). Pee may be the color of tea.  Your body making less pee.  Your eyes making fewer tears.  Heartbeat that is uneven or faster than normal (palpitations).  Headache.  Light-headedness, especially when you stand up from sitting.  Fainting (syncope). Symptoms of very bad dehydration may include:   Changes in skin, such  as:  Cold and clammy skin.  Blotchy (mottled) or pale skin.  Skin that does not quickly return to normal after being lightly pinched and let go (poor skin turgor).  Changes in body fluids, such as:  Feeling very thirsty.  Your eyes making fewer tears.  Not sweating when body temperature is high, such as in hot weather.  Your body making very little pee.  Changes in vital signs, such as:  Weak pulse.  Pulse that is more than 100 beats a minute when you are sitting still.  Fast breathing.  Low blood pressure.  Other changes, such as:  Sunken eyes.  Cold hands and feet.  Confusion.  Lack of energy (lethargy).  Trouble waking up from sleep.  Short-term weight loss.  Unconsciousness. Follow these instructions at home:  If told by your doctor, drink an ORS:  Make an ORS by using instructions on the package.  Start by drinking small amounts, about  cup (120 mL) every 5-10 minutes.  Slowly drink more until you have had the amount that your doctor said to have.  Drink enough clear fluid to keep your pee clear or pale yellow. If you were told to drink an ORS, finish the ORS first, then start slowly drinking clear fluids. Drink fluids such as:  Water. Do not drink only water by itself. Doing that can make the salt (sodium) level  in your body get too low (hyponatremia).  Ice chips.  Fruit juice that you have added water to (diluted).  Low-calorie sports drinks.  Avoid:  Alcohol.  Drinks that have a lot of sugar. These include high-calorie sports drinks, fruit juice that does not have water added, and soda.  Caffeine.  Foods that are greasy or have a lot of fat or sugar.  Take over-the-counter and prescription medicines only as told by your doctor.  Do not take salt tablets. Doing that can make the salt level in your body get too high (hypernatremia).  Eat foods that have minerals (electrolytes). Examples include bananas, oranges, potatoes, tomatoes,  and spinach.  Keep all follow-up visits as told by your doctor. This is important. Contact a doctor if:  You have belly (abdominal) pain that:  Gets worse.  Stays in one area (localizes).  You have a rash.  You have a stiff neck.  You get angry or annoyed more easily than normal (irritability).  You are more sleepy than normal.  You have a harder time waking up than normal.  You feel:  Weak.  Dizzy.  Very thirsty.  You have peed (urinated) only a small amount of very dark pee during 6-8 hours. Get help right away if:  You have symptoms of very bad dehydration.  You cannot drink fluids without throwing up (vomiting).  Your symptoms get worse with treatment.  You have a fever.  You have a very bad headache.  You are throwing up or having watery poop (diarrhea) and it:  Gets worse.  Does not go away.  You have blood or something green (bile) in your throw-up.  You have blood in your poop (stool). This may cause poop to look black and tarry.  You have not peed in 6-8 hours.  You pass out (faint).  Your heart rate when you are sitting still is more than 100 beats a minute.  You have trouble breathing. This information is not intended to replace advice given to you by your health care provider. Make sure you discuss any questions you have with your health care provider. Document Released: 06/12/2009 Document Revised: 03/05/2016 Document Reviewed: 10/10/2015 Elsevier Interactive Patient Education  2017 [employer].

## 2017-01-18 ENCOUNTER — Ambulatory Visit: Payer: Medicare Other | Admitting: Pulmonary Disease

## 2017-01-19 LAB — URINE CULTURE

## 2017-01-21 ENCOUNTER — Telehealth: Payer: Self-pay

## 2017-01-21 ENCOUNTER — Other Ambulatory Visit: Payer: Self-pay | Admitting: *Deleted

## 2017-01-21 ENCOUNTER — Telehealth: Payer: Self-pay | Admitting: *Deleted

## 2017-01-21 MED ORDER — HYDROCODONE-ACETAMINOPHEN 5-325 MG PO TABS: 1.0000 | ORAL_TABLET | Freq: Four times a day (QID) | ORAL | 0 refills | Status: DC | PRN

## 2017-01-21 NOTE — Telephone Encounter (Signed)
Call placed to patient's grandson, Thurmond Butts to notify him that prescription for Hydrocodone is ready for pick up before 3:30PM today.

## 2017-01-21 NOTE — Telephone Encounter (Signed)
Grandson [name] called for hydrocodone refill. The pt is using about 4 per day and will run out Monday night, of holiday weekend.  The hydrocodone is working "just enough to keep him comfortable".   Please call Shane at [phone] when ready for pickup.

## 2017-01-22 ENCOUNTER — Encounter: Payer: Self-pay | Admitting: Oncology

## 2017-01-25 ENCOUNTER — Encounter: Payer: Self-pay | Admitting: Oncology

## 2017-01-25 ENCOUNTER — Other Ambulatory Visit: Payer: Self-pay | Admitting: Oncology

## 2017-01-25 ENCOUNTER — Telehealth: Payer: Self-pay | Admitting: *Deleted

## 2017-01-25 ENCOUNTER — Ambulatory Visit (HOSPITAL_COMMUNITY)
Admission: RE | Admit: 2017-01-25 | Discharge: 2017-01-25 | Disposition: A | Discharge status: Home / Self Care | Payer: Medicare Other | Source: Ambulatory Visit | Attending: Nurse Practitioner | Admitting: Nurse Practitioner

## 2017-01-25 ENCOUNTER — Ambulatory Visit (HOSPITAL_BASED_OUTPATIENT_CLINIC_OR_DEPARTMENT_OTHER): Payer: Medicare Other | Admitting: Oncology

## 2017-01-25 ENCOUNTER — Encounter (HOSPITAL_COMMUNITY): Payer: Self-pay

## 2017-01-25 VITALS — BP 137/71 | HR 88 | Temp 98.6°F | Resp 18 | Ht 67.0 in | Wt 156.0 lb

## 2017-01-25 DIAGNOSIS — I77811 Abdominal aortic ectasia: Secondary | ICD-10-CM | POA: Diagnosis not present

## 2017-01-25 DIAGNOSIS — C787 Secondary malignant neoplasm of liver and intrahepatic bile duct: Secondary | ICD-10-CM | POA: Diagnosis not present

## 2017-01-25 DIAGNOSIS — B9620 Unspecified Escherichia coli [E. coli] as the cause of diseases classified elsewhere: Secondary | ICD-10-CM

## 2017-01-25 DIAGNOSIS — I700 Atherosclerosis of aorta: Secondary | ICD-10-CM | POA: Insufficient documentation

## 2017-01-25 DIAGNOSIS — C179 Malignant neoplasm of small intestine, unspecified: Secondary | ICD-10-CM

## 2017-01-25 DIAGNOSIS — R911 Solitary pulmonary nodule: Secondary | ICD-10-CM | POA: Diagnosis not present

## 2017-01-25 DIAGNOSIS — M4856XA Collapsed vertebra, not elsewhere classified, lumbar region, initial encounter for fracture: Secondary | ICD-10-CM | POA: Insufficient documentation

## 2017-01-25 DIAGNOSIS — R1011 Right upper quadrant pain: Secondary | ICD-10-CM | POA: Diagnosis not present

## 2017-01-25 DIAGNOSIS — N390 Urinary tract infection, site not specified: Secondary | ICD-10-CM

## 2017-01-25 DIAGNOSIS — R531 Weakness: Secondary | ICD-10-CM

## 2017-01-25 DIAGNOSIS — N132 Hydronephrosis with renal and ureteral calculous obstruction: Secondary | ICD-10-CM | POA: Insufficient documentation

## 2017-01-25 MED ORDER — SULFAMETHOXAZOLE-TRIMETHOPRIM 800-160 MG PO TABS: 1.0000 | ORAL_TABLET | Freq: Two times a day (BID) | ORAL | 0 refills | Status: DC

## 2017-01-25 MED ORDER — IOPAMIDOL (ISOVUE-300) INJECTION 61%
INTRAVENOUS | Status: AC
  Filled 2017-01-25: qty 100

## 2017-01-25 MED ORDER — IOPAMIDOL (ISOVUE-300) INJECTION 61%
100.0000 mL | Freq: Once | INTRAVENOUS | Status: AC | PRN
  Administered 2017-01-25: 100 mL via INTRAVENOUS

## 2017-01-25 MED ORDER — OXYCODONE HCL 5 MG PO TABS: 5.0000 mg | ORAL_TABLET | ORAL | 0 refills | Status: AC | PRN

## 2017-01-25 NOTE — Progress Notes (Signed)
SYMPTOM MANAGEMENT CLINIC    Chief Complaint: Right upper quadrant pain and weakness  HPI:  [name] 83 y.o. male diagnosed with stage IV colorectal carcinoma who is received 4 cycles of FOLFOX. The patient was brought into the symptom management clinic today following his CT scans. His grandson called our office reporting that he remained very weak and had ongoing abdominal discomfort that was not completely resolved with the hydrocodone. Patient states the hydrocodone does take the edge off. He is having significant difficulty resting due to pain. He continues to have fevers in the evening. Denies chills. He denies chest pain and shortness of breath. The patient's grandson states that the patient is using a nebulizer more often. He has use it 3 times the past 24 hours. He is having a cough at night. Denies nausea and vomiting. Bowels are moving better. He is using stool softeners. The patient has expressed that he does not feel well and is interested in talking about hospice.   No history exists.    Review of Systems  Constitutional: Positive for fever and malaise/fatigue. Negative for chills, diaphoresis and weight loss.  HENT: Negative.   Eyes: Negative.   Respiratory: Positive for cough and wheezing. Negative for hemoptysis, sputum production and shortness of breath.   Cardiovascular: Negative.   Gastrointestinal: Positive for abdominal pain. Negative for blood in stool, constipation, diarrhea, heartburn, nausea and vomiting.  Genitourinary: Negative.   Musculoskeletal: Negative.   Skin: Negative.   Neurological: Positive for weakness. Negative for dizziness, tingling, tremors, sensory change, speech change, focal weakness, seizures, loss of consciousness and headaches.  Endo/Heme/Allergies: Negative.   Psychiatric/Behavioral: Negative.     Past Medical History:  Diagnosis Date  . Anemia 2014   was on iron pills and then was able to come off  . Arthritis    "all over"    . Asthma       . Atrial flutter ([place])    a. diagnosed 03/2015  . Bacteremia 03/2016  . Complication of anesthesia    B/P dropped low and had to stay in recovery longer  . Coronary artery disease    a. s/p CABG 1986 b. redo CABG 1993 c. PTCA 2008 d. DES 2010  . Depression       . Diverticulosis   . GERD (gastroesophageal reflux disease)       . Hepatitis    hepatitis- long time ago, occupational contamination   . Hiatal hernia   . History of kidney stones   . Hyperlipemia   . Hypertension       . Insomnia       . Myocardial infarction ([place]) LAST 2010   MI 1986 WITH CABG DONE  . On home oxygen therapy    "2L; just at night" (01/31/2015)  . Peripheral neuropathy    TOES BOTH FEET  . Peripheral vascular disease (HCC)    thrombus- in leg- many yrs. ago- ?R- coumadin x1 mth  . Pneumonia 1960's AND FALL 2017  . Presence of permanent cardiac pacemaker   . Prostate cancer [facility])    radiation therapy- 2002  . Systolic CHF ([place])       . VT (ventricular tachycardia) ([place]) 06/2012   a. in the setting of STEMI b. on amio during hospitalization    Past Surgical History:  Procedure Laterality Date  . APPENDECTOMY  1943  . BILIARY DRAINAGE  05/14/2016  . CATARACT EXTRACTION W/ INTRAOCULAR LENS  IMPLANT, BILATERAL    . COLONOSCOPY    .  CORONARY ARTERY BYPASS GRAFT  1986;  1993   CABG X 5; CABG X 1  . CYSTOSCOPY W/ URETERAL STENT PLACEMENT Right 02/28/2013   Procedure: CYSTOSCOPY WITH RETROGRADE PYELOGRAM/URETERAL STENT PLACEMENT;  Surgeon: Dutch Gray, MD;  Location: [facility];  Service: Urology;  Laterality: Right;  . CYSTOSCOPY WITH URETEROSCOPY AND STENT PLACEMENT Right 03/23/2013   Procedure: RIGHT URETEROSCOPY, LASER LITHO AND STENT PLACEMENT;  Surgeon: Fredricka Bonine, MD;  Location: [facility];  Service: Urology;  Laterality: Right;  . EP IMPLANTABLE DEVICE N/A 04/11/2015   Procedure: Pacemaker Implant;  Surgeon: Will Meredith Leeds, MD;  Location: [place] CV LAB;  Service:  Cardiovascular;  Laterality: N/A;  . ERCP N/A 04/30/2016   Procedure: ENDOSCOPIC RETROGRADE CHOLANGIOPANCREATOGRAPHY (ERCP);  Surgeon: Milus Banister, MD;  Location: [place];  Service: Endoscopy;  Laterality: N/A;  . ESOPHAGOGASTRODUODENOSCOPY (EGD) WITH PROPOFOL N/A 10/28/2016   Procedure: ESOPHAGOGASTRODUODENOSCOPY (EGD) WITH PROPOFOL;  Surgeon: Milus Banister, MD;  [facility];  Service: Endoscopy;  Laterality: N/A;  . FOOT FUSION Left 2000   heel  . FRACTURE SURGERY     heel- crushed -2002,  (hardware)   . HAMMER TOE SURGERY Right    little toe  . HARDWARE REMOVAL Right 2002   great toe; Dr. Sharol Given  . HOLMIUM LASER APPLICATION Right 2/84/1324   Procedure: HOLMIUM LASER APPLICATION;  Surgeon: Fredricka Bonine, MD;  [facility]: [facility];  Service: Urology;  Laterality: Right;  . INGUINAL HERNIA REPAIR Left 01/31/2015   Procedure: OPEN REPAIR RECURRENT LEFT INGUINAL HERNIA;  Surgeon: Fanny Skates, MD;  Location: [place];  Service: General;  Laterality: Left;  . INSERTION OF MESH Left 01/31/2015   Procedure: INSERTION OF MESH;  Surgeon: Fanny Skates, MD;  Location: [place];  Service: General;  Laterality: Left;  . INTRAMEDULLARY (IM) NAIL INTERTROCHANTERIC Right 08/29/2013   Procedure: INTRAMEDULLARY (IM) NAIL INTERTROCHANTRIC;  Surgeon: Marianna Payment, MD;  Location: [place];  Service: Orthopedics;  Laterality: Right;  . IR FLUORO GUIDE PORT INSERTION RIGHT  11/29/2016  . IR GENERIC HISTORICAL  05/01/2016   IR INT EXT BILIARY DRAIN WITH CHOLANGIOGRAM 05/01/2016 Corrie Mckusick, DO [facility]  . IR GENERIC HISTORICAL  06/01/2016   IR BILIARY STENT(S) EXISTING ACCESS INC DILATION CATH EXCHANGE 06/01/2016 Corrie Mckusick, DO [facility]  . IR GENERIC HISTORICAL  05/20/2016   IR RADIOLOGIST EVAL & MGMT 05/20/2016 Greggory Keen, MD [facility]  . IR US GUIDE VASC ACCESS RIGHT  11/29/2016  . JOINT REPLACEMENT     R great toe  . KNEE ARTHROSCOPY Right X 2  . LEFT AND RIGHT HEART  CATHETERIZATION WITH CORONARY ANGIOGRAM N/A 07/15/2012   Procedure: LEFT AND RIGHT HEART CATHETERIZATION WITH CORONARY ANGIOGRAM;  Surgeon: Jettie Booze, MD;  Location: [facility] CATH LAB;  Service: Cardiovascular;  Laterality: N/A;  . LUMBAR LAMINECTOMY/DECOMPRESSION MICRODISCECTOMY  08/10/2011   Procedure: LUMBAR LAMINECTOMY/DECOMPRESSION MICRODISCECTOMY;  Surgeon: Cooper Render Pool;  Location: [place] [facility]UR[facility] ORS;  Service: Neurosurgery;  Laterality: Right;  RIGHT Lumbar five-sacral one LAMINECTOMY, MICRODISCECTOMY  . LUMBAR WOUND DEBRIDEMENT  08/22/2011   Procedure: LUMBAR WOUND DEBRIDEMENT;  Surgeon: Eustace Moore;  Location: [place] NEURO ORS;  Service: Neurosurgery;  Laterality: N/A;  Repair of CSF Leak requiring laminectomy  . PERCUTANEOUS CORONARY STENT INTERVENTION (PCI-S) N/A 07/15/2012   Procedure: PERCUTANEOUS CORONARY STENT INTERVENTION (PCI-S);  Surgeon: Jettie Booze, MD;  Location: [facility] CATH LAB;  Service: Cardiovascular;  Laterality: N/A;  . RHINOPLASTY  [id number]  . RIGHT  HIP PINNING   5 YRS AGO   DR SUE  . SQUAMOUS CELL AREA REMOVED FROM LEFT LOWER ARM  DEC 2017 AND MORE REMOVED 10-18-16 AND AREA FROM RIGHT EAR REMOVED IN PAST  . TEE WITHOUT CARDIOVERSION N/A 04/28/2016   Procedure: TRANSESOPHAGEAL ECHOCARDIOGRAM (TEE);  Surgeon: Skeet Latch, MD;  Location: [facility] ENDOSCOPY;  Service: Cardiovascular;  Laterality: N/A;  . TONSILLECTOMY  1940  . TRANSURETHRAL RESECTION OF PROSTATE  x2    has Essential hypertension; Coronary artery disease due to lipid rich plaque; HERNIA; PSORIASIS; OSTEOARTHRITIS; OSTEOPOROSIS; Ischemic heart disease; Myocardial infarction ([place]); Acute systolic congestive heart failure, NYHA class 2-EF 40-45%; Chronic respiratory failure ([place]); Chronic asthma; Peripheral neuropathy; Dyslipidemia (high LDL; low HDL); LBBB (left bundle branch block); Hx of CABG and re-do CABG ; Foot drop, right; Acute MI, anterolateral wall ([place]); Prostatitis; E. coli UTI (urinary tract  infection); Elevated transaminase level; Glucose intolerance (impaired glucose tolerance); Normocytic anemia; Junctional bradycardia; Intertrochanteric fracture of right hip ([place]); Hypertension; GERD (gastroesophageal reflux disease); COPD (chronic obstructive pulmonary disease) ([place]); H/O cardiac arrest; Systolic CHF ([place]); Depression; Urinary retention due to benign prostatic hyperplasia; Atrial flutter, RVR; Unstable angina ([place]); Coronary artery disease involving native coronary artery of native heart without angina pectoris; Pacemaker; SOB (shortness of breath); Insomnia; Bacteremia due to Gram-negative bacteria; BPH (benign prostatic hyperplasia); Asthma, mild intermittent; Elevated liver function tests; Common bile duct obstruction; Malnutrition of moderate degree; Peri-ampullary neoplasm; Cholangitis; Duodenal mass; Fever; Pyrexia; Biliary drain displacement; Chronic systolic congestive heart failure ([place]); Chronic bronchitis ([place]); Common bile duct mass; Chronic anemia; Cough; HCAP (healthcare-associated pneumonia); Loculated pleural effusion; Pleural effusion on right; Acute on chronic respiratory failure with hypoxemia ([place]); Goals of care, counseling/discussion; 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    has No Known Allergies.  Allergies as of 01/25/2017   No Known Allergies     Medication List       Accurate as of 01/25/17  3:55 PM. Always use your most recent med list.          acetaminophen 500 MG tablet Commonly known as:  TYLENOL Take 1,000 mg by mouth every 6 (six) hours as needed for mild pain, moderate pain or fever.   albuterol 108 (90 Base) MCG/ACT inhaler Commonly known as:  PROVENTIL HFA;VENTOLIN HFA Inhale 2 puffs into the lungs every 6 (six) hours as needed for wheezing or shortness of breath.   aspirin 81 MG EC tablet Take 1 tablet (81 mg total) by mouth daily.   budesonide-formoterol 80-4.5  MCG/ACT inhaler Commonly known as:  SYMBICORT Inhale 2 puffs into the lungs 2 (two) times daily.   carbamazepine 100 MG chewable tablet Commonly known as:  TEGRETOL Chew 150 mg by mouth 2 (two) times daily.   docusate sodium 100 MG capsule Commonly known as:  COLACE Take 100 mg by mouth at bedtime.   feeding supplement (ENSURE ENLIVE) Liqd Take 237 mLs by mouth 2 (two) times daily between meals.   feeding supplement (PRO-STAT SUGAR FREE 64) Liqd Take 30 mLs by mouth 2 (two) times daily.   ferrous sulfate 325 (65 FE) MG EC tablet Take 325 mg by mouth 3 (three) times daily with meals.   finasteride 5 MG tablet Commonly known as:  PROSCAR Take 5 mg by mouth every morning.   folic acid 680 MCG tablet Commonly known as:  FOLVITE Take 400 mcg by mouth daily.   furosemide 40 MG tablet Commonly known as:  LASIX Take 40 mg by mouth daily.   HYDROcodone-acetaminophen 5-325 MG tablet Commonly known as:  NORCO/VICODIN Take 1 tablet by mouth every 6 (six) hours as needed.   ipratropium 0.02 % nebulizer solution Commonly known as:  ATROVENT Take 2.5 mLs (0.5 mg total) by nebulization 2 (two) times daily as needed for wheezing or shortness of breath.   ipratropium 17 MCG/ACT inhaler Commonly known as:  ATROVENT HFA Inhale 2 puffs into the lungs 2 (two) times daily as needed for wheezing.   ipratropium-albuterol 0.5-2.5 (3) MG/3ML Soln Commonly known as:  DUONEB Take 3 mLs by nebulization every 6 (six) hours as needed.   isosorbide mononitrate 30 MG 24 hr tablet Commonly known as:  IMDUR Take 1 tablet (30 mg total) by mouth daily.   lidocaine-prilocaine cream Commonly known as:  EMLA Apply cream to port one hour prior to port access and cover with plastic wrap   Melatonin 3 MG Tabs Take 3 mg by mouth at bedtime.   metoprolol succinate 12.5 mg Tb24 24 hr tablet Commonly known as:  TOPROL-XL Take 12.5 mg by mouth daily.   nitroGLYCERIN 0.4 MG SL tablet Commonly known  as:  NITROSTAT Place 1 tablet (0.4 mg total) under the tongue every 5 (five) minutes as needed for chest pain (As needed for chest pain).   omeprazole 20 MG capsule Commonly known as:  PRILOSEC Take 20 mg by mouth daily.   OXYGEN Inhale 2 L/min into the lungs at bedtime.   potassium chloride SA 20 MEQ tablet Commonly known as:  K-DUR,KLOR-CON Take 20 mEq by mouth daily with lunch.   prochlorperazine 5 MG tablet Commonly known as:  COMPAZINE TAKE 1 TABLET BY MOUTH EVERY 6 HOURS AS NEEDED FOR NAUSEA OR VOMITING   ranitidine 150 MG tablet Commonly known as:  ZANTAC Take 1 tablet (150 mg total) by mouth 2 (two) times daily.   senna 8.6 MG tablet Commonly known as:  SENOKOT Take 1 tablet by mouth daily.   sertraline 100 MG tablet Commonly known as:  ZOLOFT Take 150 mg by mouth every morning.   tamsulosin 0.4 MG Caps capsule Commonly known as:  FLOMAX Take 1 capsule (0.4 mg total) by mouth daily.   traMADol 50 MG tablet Commonly known as:  ULTRAM Take 1 tablet (50 mg total) by mouth every 6 (six) hours as needed.   traZODone 50 MG tablet Commonly known as:  DESYREL TAKE ONE TABLET BY MOUTH AT BEDTIME        PHYSICAL EXAMINATION  Oncology Vitals 01/25/2017 01/17/2017  Height 170 cm -  Weight 70.761 kg -  Weight (lbs) 156 lbs -  BMI (kg/m2) 24.43 kg/m2 -  Temp 98.6 -  Pulse 88 85  Resp 18 -  SpO2 96 96  BSA (m2) 1.83 m2 -   BP Readings from Last 2 Encounters:  01/25/17 137/71  01/17/17 136/68    Physical Exam  Constitutional: He is oriented to person, place, and time.  Chronically ill-appearing gentleman who is in no acute distress.  HENT:  Head: Normocephalic.  Mouth/Throat: Oropharynx is clear and moist. No oropharyngeal exudate.  Eyes: Conjunctivae are normal. Right eye exhibits no discharge. Left eye exhibits no discharge. No scleral icterus.  Neck: No JVD present. No tracheal deviation present. No thyromegaly present.  Cardiovascular: Normal rate,  regular rhythm and intact distal pulses.   Pulmonary/Chest: Effort normal. No stridor. He has wheezes. He has no rales.  Wheezes to the posterior upper lobes of the lungs.  Abdominal: Soft. Bowel sounds are normal. There is tenderness.  Tenderness with palpation over the right upper quadrant.  Musculoskeletal: He exhibits no edema.  Lymphadenopathy:    He has no cervical adenopathy.  Neurological: He is alert and oriented to person, place, and time. He displays normal reflexes. He exhibits normal muscle tone. Coordination normal.  Skin: Skin is warm and dry. No rash noted. No erythema. No pallor.  Psychiatric: Mood, memory, affect and judgment normal.  Vitals reviewed.   LABORATORY DATA:. No visits with results within 3 Day(s) from this visit.  Latest known visit with results is:  Appointment on 01/17/2017  Component Date Value Ref Range Status  . WBC 01/17/2017 4.3  4.0 - 10.3 10e3/uL Final  . NEUT# 01/17/2017 3.2  1.5 - 6.5 10e3/uL Final  . HGB 01/17/2017 9.6* 13.0 - 17.1 g/dL Final  . HCT 01/17/2017 29.7* 38.4 - 49.9 % Final  . Platelets 01/17/2017 273  140 - 400 10e3/uL Final  . MCV 01/17/2017 91.2  79.3 - 98.0 fL Final  . MCH 01/17/2017 29.5  27.2 - 33.4 pg Final  . MCHC 01/17/2017 32.4  32.0 - 36.0 g/dL Final  . RBC 01/17/2017 3.26* 4.20 - 5.82 10e6/uL Final  . RDW 01/17/2017 16.4* 11.0 - 14.6 % Final  . lymph# 01/17/2017 1.0  0.9 - 3.3 10e3/uL Final  . MONO# 01/17/2017 0.1  0.1 - 0.9 10e3/uL Final  . Eosinophils Absolute 01/17/2017 0.0  0.0 - 0.5 10e3/uL Final  . Basophils Absolute 01/17/2017 0.0  0.0 - 0.1 10e3/uL Final  . NEUT% 01/17/2017 74.4  39.0 - 75.0 % Final  . LYMPH% 01/17/2017 22.4  14.0 - 49.0 % Final  . MONO% 01/17/2017 2.1  0.0 - 14.0 % Final  . EOS% 01/17/2017 0.5  0.0 - 7.0 % Final  . BASO% 01/17/2017 0.6  0.0 - 2.0 % Final  . Sodium 01/17/2017 140  136 - 145 mEq/L Final  . Potassium 01/17/2017 4.7  3.5 - 5.1 mEq/L Final  . Chloride 01/17/2017 103  98 -  109 mEq/L Final  . CO2 01/17/2017 28  22 - 29 mEq/L Final  . Glucose 01/17/2017 115  70 - 140 mg/dl Final   Glucose reference range is for nonfasting patients. Fasting glucose reference range is 70- 100.  [name] BUN 01/17/2017 18.2  7.0 - 26.0 mg/dL Final  . Creatinine 01/17/2017 0.7  0.7 - 1.3 mg/dL Final  . Total Bilirubin 01/17/2017 0.43  0.20 - 1.20 mg/dL Final  . Alkaline Phosphatase 01/17/2017 204* 40 - 150 U/L Final  . AST 01/17/2017 74* 5 - 34 U/L Final  . ALT 01/17/2017 34  0 - 55 U/L Final  . Total Protein 01/17/2017 6.6  6.4 - 8.3 g/dL Final  . Albumin 01/17/2017 2.5* 3.5 - 5.0 g/dL Final  . Calcium 01/17/2017 11.0* 8.4 - 10.4 mg/dL Final  . Anion Gap 01/17/2017 9  3 - 11 mEq/L Final  . EGFR 01/17/2017 83* >90 ml/min/1.73 m2 Final   eGFR is calculated using the CKD-EPI Creatinine Equation (2009)  . Glucose 01/17/2017 Negative  Negative mg/dL Final  . Bilirubin (Urine) 01/17/2017 Negative  Negative Final  . Ketones 01/17/2017 Negative  Negative mg/dL Final  . Specific Gravity, Urine 01/17/2017 1.010  1.003 - 1.035 Final  . Blood 01/17/2017 Negative  Negative Final  . pH 01/17/2017 7.5  4.6 - 8.0 Final  . Protein 01/17/2017 < 30  Negative- <30 mg/dL Final  . Urobilinogen, UR 01/17/2017 0.2  0.2 -  1 mg/dL Final  . Nitrite 01/17/2017 Negative  Negative Final  . Leukocyte Esterase 01/17/2017 Trace  Negative Final  . RBC / HPF 01/17/2017 Negative  0 - 2 Final  . WBC, UA 01/17/2017 0-2  0-2;Negative Final  . Bacteria, UA 01/17/2017 Few  Negative- Trace Final  . Epithelial Cells 01/17/2017 Occasional  Negative- Few Final  . Amorphous, UA 01/17/2017 Moderate  Negative- Small Final  . COMMENTS: 01/17/2017 Less than 10 mls submitted for testing.  Negative Final  . Urine Culture, Routine 01/17/2017 Final report*  Final  . Urine Culture result 1 01/17/2017 Escherichia coli*  Final   Comment: Greater than 100,000 colony forming units per mL Cefazolin <=4 ug/mL Cefazolin with an MIC <=16  predicts susceptibility to the oral agents cefaclor, cefdinir, cefpodoxime, cefprozil, cefuroxime, cephalexin, and loracarbef when used for therapy of uncomplicated urinary tract infections due to E. coli, Klebsiella pneumoniae, and Proteus mirabilis.   [name] RESULT 2 01/17/2017 Comment   Final   Comment: Microbiological testing to rule out the presence of possible pathogens is in progress.   . ANTIMICROBIAL SUSCEPTIBILITY 01/17/2017 Comment   Final   Comment:       ** S = Susceptible; I = Intermediate; R = Resistant **                    P = Positive; N = Negative             MICS are expressed in micrograms per mL    Antibiotic                 RSLT#1    RSLT#2    RSLT#3    RSLT#4 Amoxicillin/Clavulanic Acid    S Ampicillin                     S Cefepime                       S Ceftriaxone                    S Cefuroxime                     I Cephalothin                    I Ciprofloxacin                  R Ertapenem                      S Gentamicin                     S Imipenem                       S Levofloxacin                   R Nitrofurantoin                 S Piperacillin                   S Tetracycline                   S Tobramycin                     S Trimethoprim/Sulfa  S     RADIOGRAPHIC STUDIES: Ct Abdomen Pelvis W Contrast  Result Date: 01/25/2017 CLINICAL DATA:  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 EXAM: CT ABDOMEN AND PELVIS WITH CONTRAST TECHNIQUE: Multidetector CT imaging of the abdomen and pelvis was performed using the standard protocol following bolus administration of intravenous contrast. CONTRAST:  119m ISOVUE-300 IOPAMIDOL (ISOVUE-300) INJECTION 61% COMPARISON:  11/17/2016 CT abdomen/pelvis. FINDINGS: Lower chest: Anterior left lower lobe 3 mm solid pulmonary nodule (series 4/image 14) appears new since 06/03/2016 chest CT angiogram. Stable discontinuity in the  lower most sternotomy wire. Coronary atherosclerosis status post CABG. Pacemaker lead tips are seen in the right atrium and right ventricular apex. Hepatobiliary: Liver is mildly enlarged by multiple (greater than 5) irregular low-attenuation liver masses, which have all increased in size in the interval. Representative liver masses as follows: - segment 6 right liver lobe 7.5 x 5.5 cm mass (series 2/image 39), increased from 4.8 x 3.7 cm - segment 7 right liver lobe 8.9 x 6.6 cm mass (series 2/image 22), increased from 4.5 x 3.8 cm - segment 8/4A anterior liver 12.0 x 6.3 cm mass (series 2/image 20), increased from 5.6 x 4.0 cm Stable well-positioned biliary stent in the lower common bile duct. Stable expected pneumobilia in the stable top-normal caliber intrahepatic bile ducts, common bile duct and gallbladder. Probable tiny layering gallstones in the nondistended gallbladder, with no gallbladder wall thickening or pericholecystic fluid. Pancreas: Stable diffuse pancreatic duct dilation up to 13 mm diameter. Stable diffuse pancreatic parenchymal atrophy. No discrete pancreatic mass. Spleen: Normal size. No mass. Adrenals/Urinary Tract: No discrete adrenal nodules. Mild right hydroureteronephrosis with new 6 mm right ureterovesical junction stone. Simple renal cysts in both kidneys measuring up to the 3.1 cm in the posterior upper right kidney and 3.2 cm in the anterior upper left kidney. Additional subcentimeter hypodense renal cortical lesions in both kidneys are too small to characterize. No left hydronephrosis. Normal bladder. Stomach/Bowel: Grossly normal stomach. Stable mild eccentric wall thickening in the lower periampullary duodenal wall. Appendectomy. Mild sigmoid diverticulosis. No large bowel wall thickening or pericolonic fat stranding. Oral contrast reaches the transverse colon. Vascular/Lymphatic: Atherosclerotic ectatic 2.5 cm infrarenal abdominal aorta, unchanged. Patent portal, splenic, hepatic  and renal veins. No pathologically enlarged lymph nodes in the abdomen or pelvis. Reproductive: Stable top-normal size prostate with nonspecific internal prostatic calcifications. Other: No pneumoperitoneum, ascites or focal fluid collection. Musculoskeletal: No aggressive appearing focal osseous lesions. Mild L2 vertebral compression fracture, new since 11/17/2016 CT. Moderate thoracolumbar spondylosis. IMPRESSION: 1. New obstructing 6 mm right UVJ stone with mild right hydroureteronephrosis. 2. Mild L2 vertebral compression fracture, new since 11/17/2016 CT. 3. Significant interval growth of bulky liver metastatic disease. 4. New 3 mm solid left lower lobe pulmonary nodule, pulmonary metastasis not excluded . 5. 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 Well-positioned lower common bile duct stent. Bile ducts are stable with expected pneumobilia. Probable cholelithiasis. 6. Aortic atherosclerosis. Stable ectatic 2.5 cm infrarenal abdominal aorta. These results will be called to the ordering clinician or representative by the Radiologist Assistant, and communication documented in the PACS or zVision Dashboard. Electronically Signed   By: JIlona SorrelM.D.   On: 01/25/2017 15:45    ASSESSMENT/PLAN:    No problem-specific Assessment & Plan notes found for this encounter.  This is an 83year old gentleman with stage IV colorectal carcinoma. Patient is status post 4 cycles of FOLFOX. Patient has been getting weaker  and is having ongoing right upper quadrant pain. Hydrocodone has not been fully effective for the pain.  The patient had a restaging CT scan performed earlier today. Results of an reviewed which show a new obstructing 6 mm right UVJ stone with mild right hydroureter nephrosis, mild L2 vertebral compression fracture, significant interval growth of bulky liver metastatic disease, new 3 mm solid left lower lobe pulmonary nodule, pulmonary  metastasis not excluded, stable eccentric, periampullary duodenal wall thickening compatible with stable primary neoplasm, and aortic atherosclerosis.  Given the location of the pain, suspect the pain is due to enlargement of the liver metastases. The patient was given a prescription for oxycodone 5 mg tablets to take 1-2 tablets every 4 hours as needed for severe pain. He was encouraged to use a stool softener and laxative to prevent constipation.  Urine culture from last week does show greater than 100,000 Escherichia coli and he was given a prescription for Bactrim DS 1 tablet twice a day for one week. CT scan shows a right UVJ stone and we will ask his urologist, Dr. [name], to review his CT scans to determine if there are any interventions that we should consider.  [name] has seen and examined this patient with me today. He has discussed the CT scan results in detail with the patient, his wife, and grandson. Treatment options were discussed including considering second line chemotherapy versus hospice. Discussed with the patient that he may not tolerate chemotherapy well due to his deconditioning. The patient has opted for hospice. A referral will be made from our office to hospice and positive care of [facility].  CODE STATUS was discussed with the patient and he requested to be a DO NOT RESUSCITATE.  We will cancel the patient's visits for later this week when he was due for his next cycle of chemotherapy. He will keep his appointment as scheduled on June 14 that we can reevaluate his pain.   Patient stated understanding of all instructions; and was in agreement with this plan of care. The patient knows to call the clinic with any problems, questions or concerns.   Mikey Bussing, NP 01/25/2017  This was a shared visit with Mikey Bussing. Mr. Mccaskey was interviewed and examined.  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 I suspect his pain  is related to liver metastases. He is not a candidate for salvage chemotherapy. He agrees to a [facility] referral. We discussed CPR and ACLS issues. He will be placed on a no CODE BLUE status. We adjusted the narcotic analgesic regimen today.  I doubt his pain is related to the right UVJ stone, though the stone may explain the recent urinary tract infection. He completed course of Bactrim. I will ask Dr. [name] to look at the CT and recommend palliative treatment if indicated.  Julieanne Manson, M.D.

## 2017-01-25 NOTE — Telephone Encounter (Signed)
[name] reports he's moved in to help care for his grandfather.  "Has [name] received the message we sent this weekend?  The [facility] staff came for physical therapy and would not perform any services stating he's too weak and it will not benefit him.  Per [facility] we need to ask [name] for a Hospice referral.  We'd like to know do we need to change course.  He's for a CT scan today.   He wants to have the scan if needed.  Please call the home at [phone].  We've used the nebulizer  Three times in the past 24 hours due to increased cough.  He's in constant pain.  Hydrocodone dulls the pain.  Not sleeping.  Last night he laid down at 8:30/9:00 pm ansd was up at 11:45 pm."   Will notify provider.

## 2017-01-26 ENCOUNTER — Telehealth: Payer: Self-pay | Admitting: Oncology

## 2017-01-26 NOTE — Telephone Encounter (Signed)
Per 5/29 los cxd all appointments 5/31, 6/2 and treatment only 6/14. Spoke with patient re cancellations and next appointment for lab/flush/LT 6/14.

## 2017-01-27 ENCOUNTER — Telehealth: Payer: Self-pay | Admitting: *Deleted

## 2017-01-27 ENCOUNTER — Encounter: Payer: Medicare Other | Admitting: Nutrition

## 2017-01-27 ENCOUNTER — Ambulatory Visit: Payer: Medicare Other

## 2017-01-27 ENCOUNTER — Ambulatory Visit: Payer: Medicare Other | Admitting: Nurse Practitioner

## 2017-01-27 ENCOUNTER — Other Ambulatory Visit: Payer: Medicare Other

## 2017-01-27 NOTE — Telephone Encounter (Signed)
Call placed back to patient in reference to his call regarding a w/c through [facility].  Patient's hospice nurse at his home at the time of call and stated that she would take care of ordering a w/c for patient.

## 2017-01-27 NOTE — Telephone Encounter (Signed)
Returned call to patient after receipt of voice mail to "confirm our office will pick up a wheel chair from [facility] we ordered last week for him.  There will not be a co-pay if you all pick up the wheel chair is my understanding.  We've been waiting for wheelchair since last week because [facility] needs the model and size.  A nurse from Hospice will come today between 2:00 to 3:00 pm.  I've already received a hospital bed and potty chair through Hospice.  I can transfer from bed to a wheelchair.  "  Voicemail left for collaborative.  Asked that he also notify hospice of this request.

## 2017-01-28 ENCOUNTER — Encounter (HOSPITAL_COMMUNITY): Payer: Self-pay

## 2017-01-28 ENCOUNTER — Emergency Department (HOSPITAL_COMMUNITY)

## 2017-01-28 ENCOUNTER — Inpatient Hospital Stay (HOSPITAL_COMMUNITY)
Admission: EM | Admit: 2017-01-28 | Discharge: 2017-01-31 | DRG: 193 | Disposition: A | Discharge status: Hospice | Attending: Family Medicine | Admitting: Family Medicine

## 2017-01-28 DIAGNOSIS — C787 Secondary malignant neoplasm of liver and intrahepatic bile duct: Secondary | ICD-10-CM | POA: Diagnosis present

## 2017-01-28 DIAGNOSIS — C179 Malignant neoplasm of small intestine, unspecified: Secondary | ICD-10-CM | POA: Diagnosis present

## 2017-01-28 DIAGNOSIS — Z8249 Family history of ischemic heart disease and other diseases of the circulatory system: Secondary | ICD-10-CM

## 2017-01-28 DIAGNOSIS — J9690 Respiratory failure, unspecified, unspecified whether with hypoxia or hypercapnia: Secondary | ICD-10-CM | POA: Diagnosis present

## 2017-01-28 DIAGNOSIS — K219 Gastro-esophageal reflux disease without esophagitis: Secondary | ICD-10-CM | POA: Diagnosis present

## 2017-01-28 DIAGNOSIS — Z801 Family history of malignant neoplasm of trachea, bronchus and lung: Secondary | ICD-10-CM

## 2017-01-28 DIAGNOSIS — E43 Unspecified severe protein-calorie malnutrition: Secondary | ICD-10-CM | POA: Insufficient documentation

## 2017-01-28 DIAGNOSIS — I2510 Atherosclerotic heart disease of native coronary artery without angina pectoris: Secondary | ICD-10-CM | POA: Diagnosis present

## 2017-01-28 DIAGNOSIS — J4520 Mild intermittent asthma, uncomplicated: Secondary | ICD-10-CM | POA: Diagnosis present

## 2017-01-28 DIAGNOSIS — I509 Heart failure, unspecified: Secondary | ICD-10-CM | POA: Diagnosis not present

## 2017-01-28 DIAGNOSIS — C19 Malignant neoplasm of rectosigmoid junction: Secondary | ICD-10-CM

## 2017-01-28 DIAGNOSIS — I700 Atherosclerosis of aorta: Secondary | ICD-10-CM | POA: Diagnosis present

## 2017-01-28 DIAGNOSIS — Z951 Presence of aortocoronary bypass graft: Secondary | ICD-10-CM

## 2017-01-28 DIAGNOSIS — I252 Old myocardial infarction: Secondary | ICD-10-CM

## 2017-01-28 DIAGNOSIS — J9601 Acute respiratory failure with hypoxia: Secondary | ICD-10-CM | POA: Diagnosis not present

## 2017-01-28 DIAGNOSIS — D638 Anemia in other chronic diseases classified elsewhere: Secondary | ICD-10-CM | POA: Diagnosis present

## 2017-01-28 DIAGNOSIS — Z8546 Personal history of malignant neoplasm of prostate: Secondary | ICD-10-CM

## 2017-01-28 DIAGNOSIS — R748 Abnormal levels of other serum enzymes: Secondary | ICD-10-CM | POA: Diagnosis present

## 2017-01-28 DIAGNOSIS — E785 Hyperlipidemia, unspecified: Secondary | ICD-10-CM | POA: Diagnosis present

## 2017-01-28 DIAGNOSIS — Z87442 Personal history of urinary calculi: Secondary | ICD-10-CM

## 2017-01-28 DIAGNOSIS — Z8042 Family history of malignant neoplasm of prostate: Secondary | ICD-10-CM

## 2017-01-28 DIAGNOSIS — J189 Pneumonia, unspecified organism: Principal | ICD-10-CM | POA: Diagnosis present

## 2017-01-28 DIAGNOSIS — Z8674 Personal history of sudden cardiac arrest: Secondary | ICD-10-CM

## 2017-01-28 DIAGNOSIS — G629 Polyneuropathy, unspecified: Secondary | ICD-10-CM | POA: Diagnosis present

## 2017-01-28 DIAGNOSIS — F329 Major depressive disorder, single episode, unspecified: Secondary | ICD-10-CM | POA: Diagnosis present

## 2017-01-28 DIAGNOSIS — J9621 Acute and chronic respiratory failure with hypoxia: Secondary | ICD-10-CM | POA: Diagnosis present

## 2017-01-28 DIAGNOSIS — G4700 Insomnia, unspecified: Secondary | ICD-10-CM | POA: Diagnosis present

## 2017-01-28 DIAGNOSIS — Z8701 Personal history of pneumonia (recurrent): Secondary | ICD-10-CM

## 2017-01-28 DIAGNOSIS — I110 Hypertensive heart disease with heart failure: Secondary | ICD-10-CM | POA: Diagnosis present

## 2017-01-28 DIAGNOSIS — Z961 Presence of intraocular lens: Secondary | ICD-10-CM | POA: Diagnosis present

## 2017-01-28 DIAGNOSIS — I5022 Chronic systolic (congestive) heart failure: Secondary | ICD-10-CM | POA: Diagnosis present

## 2017-01-28 DIAGNOSIS — M810 Age-related osteoporosis without current pathological fracture: Secondary | ICD-10-CM | POA: Diagnosis present

## 2017-01-28 DIAGNOSIS — C7800 Secondary malignant neoplasm of unspecified lung: Secondary | ICD-10-CM | POA: Diagnosis present

## 2017-01-28 DIAGNOSIS — Y95 Nosocomial condition: Secondary | ICD-10-CM | POA: Diagnosis present

## 2017-01-28 DIAGNOSIS — M1990 Unspecified osteoarthritis, unspecified site: Secondary | ICD-10-CM | POA: Diagnosis present

## 2017-01-28 DIAGNOSIS — Z66 Do not resuscitate: Secondary | ICD-10-CM | POA: Diagnosis present

## 2017-01-28 DIAGNOSIS — Z9981 Dependence on supplemental oxygen: Secondary | ICD-10-CM

## 2017-01-28 DIAGNOSIS — R0602 Shortness of breath: Secondary | ICD-10-CM

## 2017-01-28 DIAGNOSIS — Z515 Encounter for palliative care: Secondary | ICD-10-CM | POA: Diagnosis present

## 2017-01-28 DIAGNOSIS — Z87440 Personal history of urinary (tract) infections: Secondary | ICD-10-CM

## 2017-01-28 DIAGNOSIS — J9600 Acute respiratory failure, unspecified whether with hypoxia or hypercapnia: Secondary | ICD-10-CM | POA: Diagnosis present

## 2017-01-28 DIAGNOSIS — I4892 Unspecified atrial flutter: Secondary | ICD-10-CM | POA: Diagnosis present

## 2017-01-28 DIAGNOSIS — J440 Chronic obstructive pulmonary disease with acute lower respiratory infection: Secondary | ICD-10-CM | POA: Diagnosis present

## 2017-01-28 DIAGNOSIS — Z79899 Other long term (current) drug therapy: Secondary | ICD-10-CM

## 2017-01-28 DIAGNOSIS — Z7982 Long term (current) use of aspirin: Secondary | ICD-10-CM

## 2017-01-28 DIAGNOSIS — J441 Chronic obstructive pulmonary disease with (acute) exacerbation: Secondary | ICD-10-CM | POA: Diagnosis not present

## 2017-01-28 DIAGNOSIS — Z9841 Cataract extraction status, right eye: Secondary | ICD-10-CM

## 2017-01-28 DIAGNOSIS — I447 Left bundle-branch block, unspecified: Secondary | ICD-10-CM | POA: Diagnosis present

## 2017-01-28 DIAGNOSIS — Z9842 Cataract extraction status, left eye: Secondary | ICD-10-CM

## 2017-01-28 DIAGNOSIS — Z950 Presence of cardiac pacemaker: Secondary | ICD-10-CM

## 2017-01-28 DIAGNOSIS — I739 Peripheral vascular disease, unspecified: Secondary | ICD-10-CM | POA: Diagnosis present

## 2017-01-28 LAB — I-STAT CHEM 8, ED
BUN: 18 mg/dL (ref 6–20)
CHLORIDE: 104 mmol/L (ref 101–111)
Calcium, Ion: 1.24 mmol/L (ref 1.15–1.40)
Creatinine, Ser: 0.7 mg/dL (ref 0.61–1.24)
Glucose, Bld: 142 mg/dL — ABNORMAL HIGH (ref 65–99)
HCT: 25 % — ABNORMAL LOW (ref 39.0–52.0)
Hemoglobin: 8.5 g/dL — ABNORMAL LOW (ref 13.0–17.0)
POTASSIUM: 4.6 mmol/L (ref 3.5–5.1)
SODIUM: 135 mmol/L (ref 135–145)
TCO2: 23 mmol/L (ref 0–100)

## 2017-01-28 LAB — I-STAT VENOUS BLOOD GAS, ED
ACID-BASE DEFICIT: 5 mmol/L — AB (ref 0.0–2.0)
BICARBONATE: 19.5 mmol/L — AB (ref 20.0–28.0)
O2 Saturation: 94 %
PO2 VEN: 71 mmHg — AB (ref 32.0–45.0)
TCO2: 21 mmol/L (ref 0–100)
pCO2, Ven: 33.5 mmHg — ABNORMAL LOW (ref 44.0–60.0)
pH, Ven: 7.373 (ref 7.250–7.430)

## 2017-01-28 LAB — COMPREHENSIVE METABOLIC PANEL
ALT: 36 U/L (ref 17–63)
ANION GAP: 10 (ref 5–15)
AST: 102 U/L — ABNORMAL HIGH (ref 15–41)
Albumin: 2.2 g/dL — ABNORMAL LOW (ref 3.5–5.0)
Alkaline Phosphatase: 200 U/L — ABNORMAL HIGH (ref 38–126)
BILIRUBIN TOTAL: 0.5 mg/dL (ref 0.3–1.2)
BUN: 17 mg/dL (ref 6–20)
CALCIUM: 9.2 mg/dL (ref 8.9–10.3)
CO2: 20 mmol/L — ABNORMAL LOW (ref 22–32)
Chloride: 105 mmol/L (ref 101–111)
Creatinine, Ser: 0.83 mg/dL (ref 0.61–1.24)
Glucose, Bld: 141 mg/dL — ABNORMAL HIGH (ref 65–99)
POTASSIUM: 4.8 mmol/L (ref 3.5–5.1)
Sodium: 135 mmol/L (ref 135–145)
TOTAL PROTEIN: 6.4 g/dL — AB (ref 6.5–8.1)

## 2017-01-28 LAB — CBC WITH DIFFERENTIAL/PLATELET
BASOS PCT: 0 %
Basophils Absolute: 0 10*3/uL (ref 0.0–0.1)
Eosinophils Absolute: 0 10*3/uL (ref 0.0–0.7)
Eosinophils Relative: 0 %
HCT: 27.5 % — ABNORMAL LOW (ref 39.0–52.0)
Hemoglobin: 8.5 g/dL — ABNORMAL LOW (ref 13.0–17.0)
Lymphocytes Relative: 11 %
Lymphs Abs: 0.7 10*3/uL (ref 0.7–4.0)
MCH: 28.4 pg (ref 26.0–34.0)
MCHC: 30.9 g/dL (ref 30.0–36.0)
MCV: 92 fL (ref 78.0–100.0)
MONO ABS: 0.8 10*3/uL (ref 0.1–1.0)
Monocytes Relative: 13 %
Neutro Abs: 4.8 10*3/uL (ref 1.7–7.7)
Neutrophils Relative %: 76 %
Platelets: 279 10*3/uL (ref 150–400)
RBC: 2.99 MIL/uL — ABNORMAL LOW (ref 4.22–5.81)
RDW: 18.9 % — AB (ref 11.5–15.5)
WBC: 6.3 10*3/uL (ref 4.0–10.5)

## 2017-01-28 LAB — I-STAT CG4 LACTIC ACID, ED
LACTIC ACID, VENOUS: 1.82 mmol/L (ref 0.5–1.9)
Lactic Acid, Venous: 2.08 mmol/L (ref 0.5–1.9)

## 2017-01-28 LAB — I-STAT TROPONIN, ED: TROPONIN I, POC: 0.19 ng/mL — AB (ref 0.00–0.08)

## 2017-01-28 LAB — POC OCCULT BLOOD, ED: FECAL OCCULT BLD: NEGATIVE

## 2017-01-28 IMAGING — CT CT ANGIO CHEST
2 of 6 series · 18 of 36 positions shown · IV contrast (Omni 300)
Comparison: Radiographs June 02, 2016.

CLINICAL DATA: Chest pain, shortness of breath.

EXAM:
CT ANGIOGRAPHY CHEST WITH CONTRAST
TECHNIQUE: Multidetector CT imaging of the chest was performed using the
standard protocol during bolus administration of intravenous
contrast. Multiplanar CT image reconstructions and MIPs were
obtained to evaluate the vascular anatomy.
CONTRAST:  80 mL of Isovue 370 intravenously.

[Series 6: pe thins · axial · 0.71mm/px · z∈[-6,+288]mm · 17 of 332 slices shown]
[im 19/332  lung]
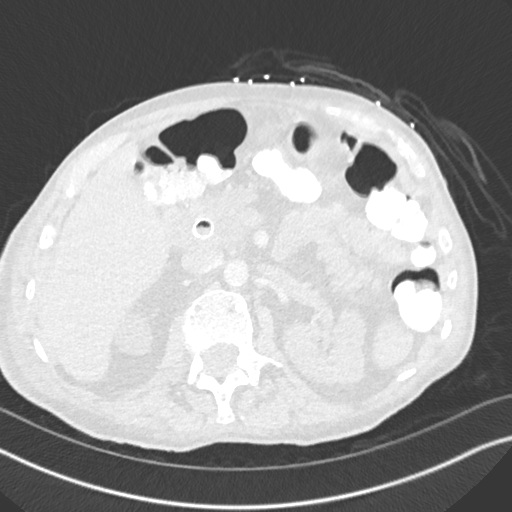
[im 37/332  mediastinal]
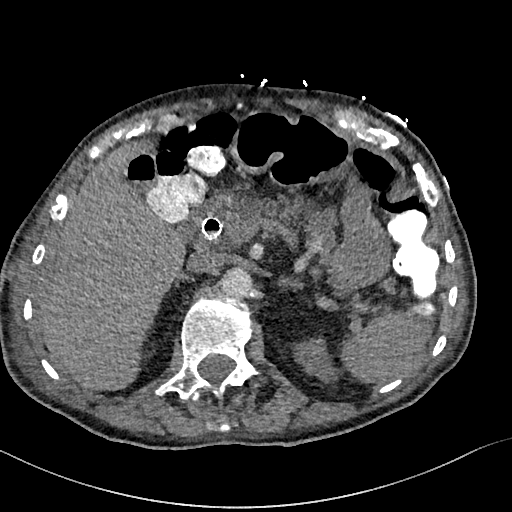
[im 56/332  lung]
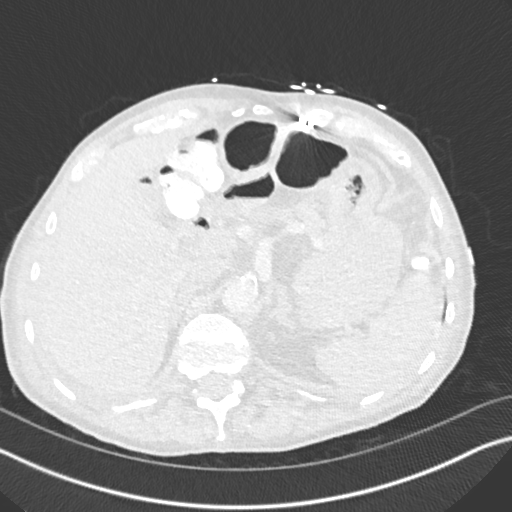
[im 74/332  mediastinal]
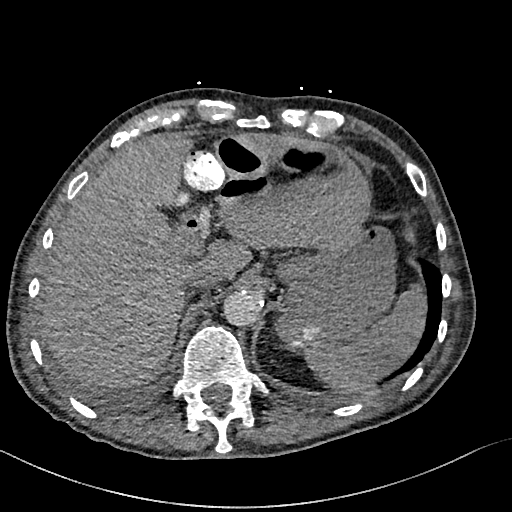
[im 92/332  lung]
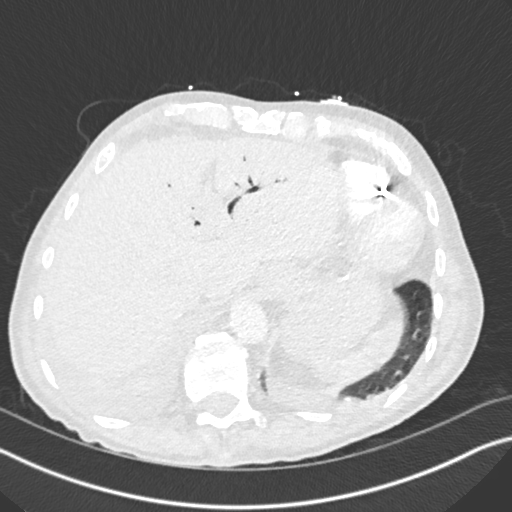
[im 111/332  mediastinal]
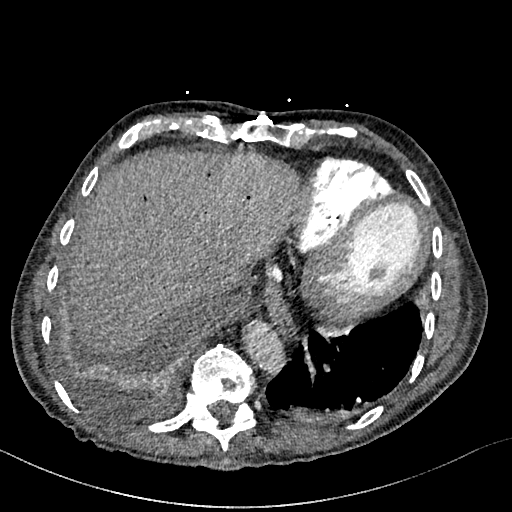
[im 129/332  lung]
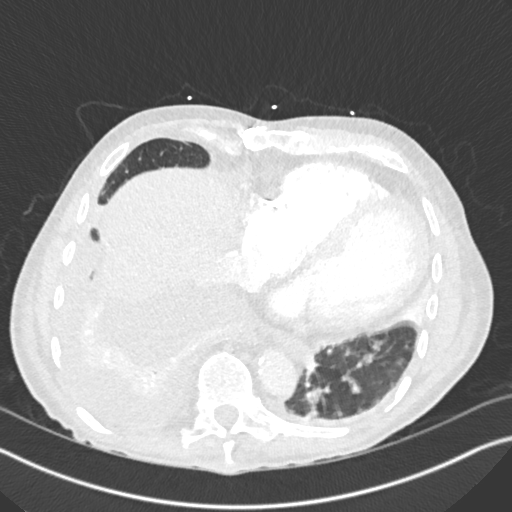
[im 148/332  mediastinal]
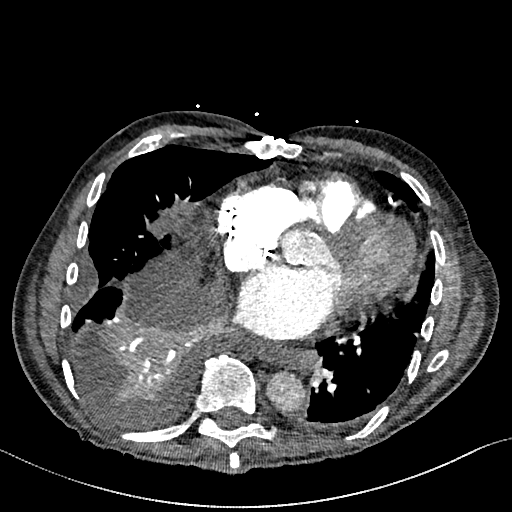
[im 166/332  lung]
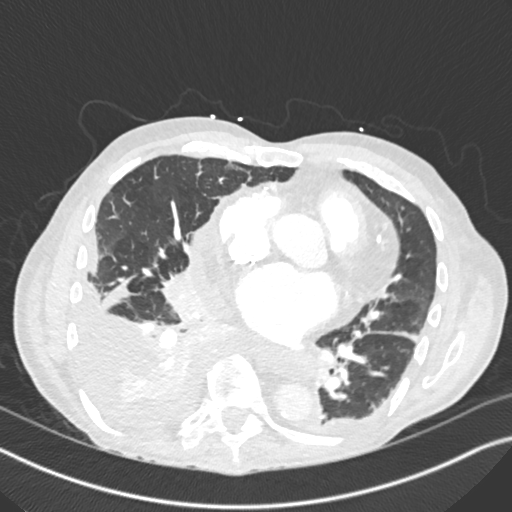
[im 184/332  mediastinal]
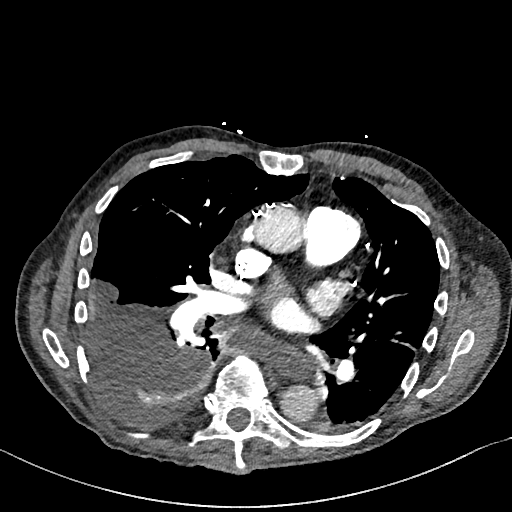
[im 203/332  lung]
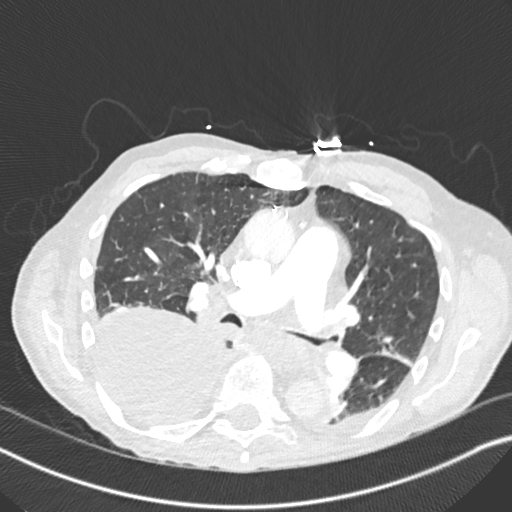
[im 221/332  mediastinal]
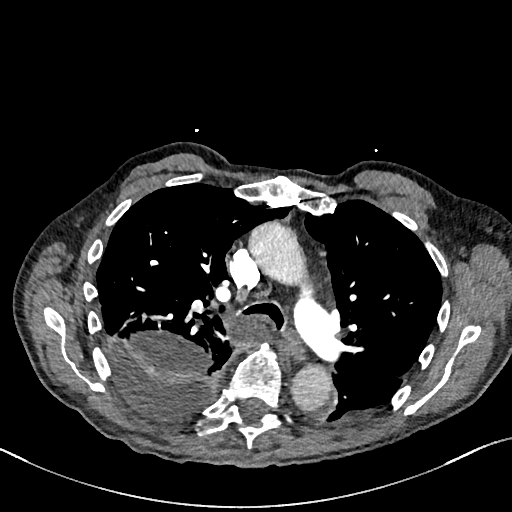
[im 240/332  lung]
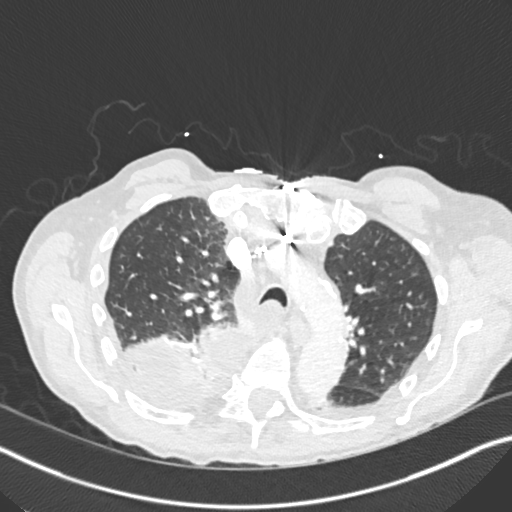
[im 258/332  mediastinal]
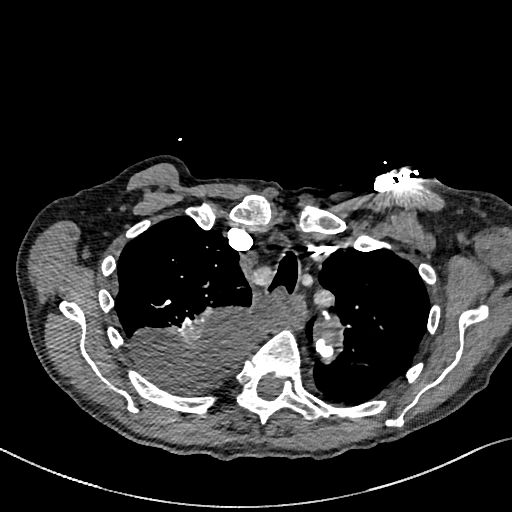
[im 276/332  lung]
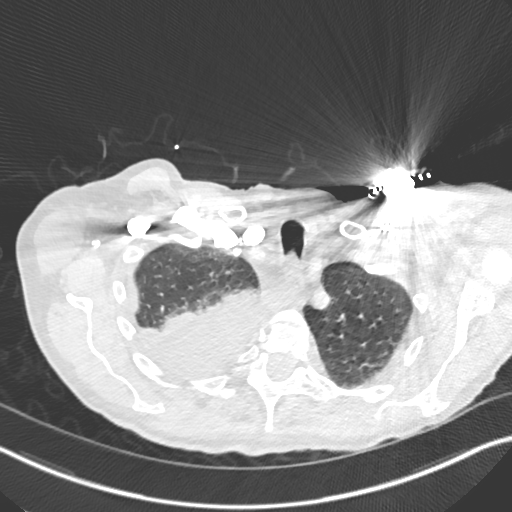
[im 295/332  mediastinal]
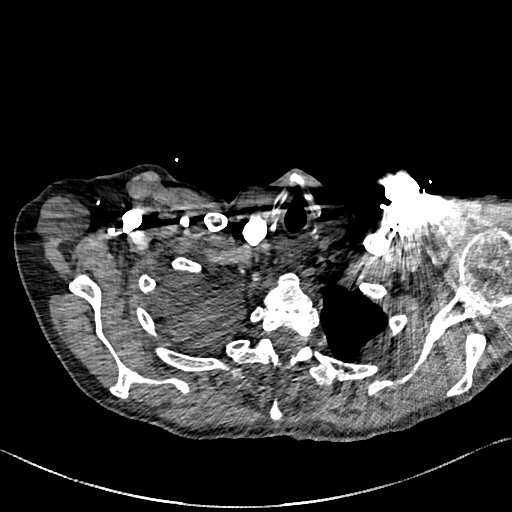
[im 313/332  lung]
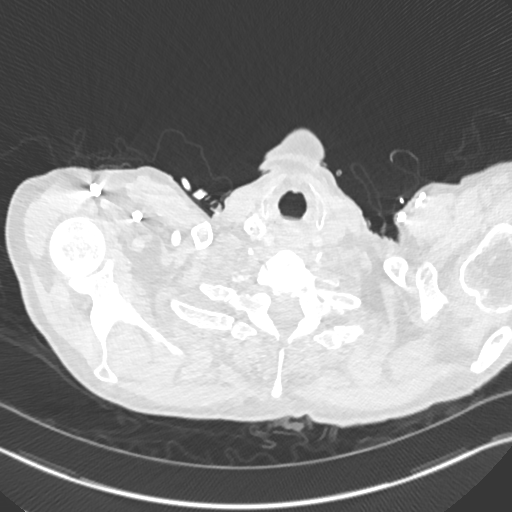

[Series 7: pe 2mm cor · coronal · 0.65mm/px · 1 of 138 slices shown]
[im 69/138  mediastinal]
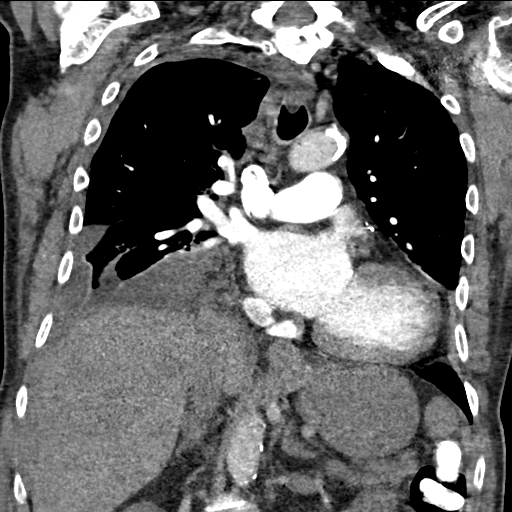

[18 of 36 positions shown; findings below may reference images not displayed]

FINDINGS: Cardiovascular: Atherosclerosis of thoracic aorta is noted without
aneurysm. Status post coronary artery bypass graft. There is no
definite evidence of pulmonary embolus.

Mediastinum/Nodes: No mediastinal mass or adenopathy is noted.

Lungs/Pleura: No pneumothorax is noted. Mild left posterior basilar
subsegmental atelectasis is noted. Large loculated right pleural
effusion is noted.

Upper Abdomen: Left hepatic pneumobilia is noted. Biliary stent is
noted in common bile duct.

Musculoskeletal: No significant osseous abnormality is noted.

Review of the MIP images confirms the above findings.
IMPRESSION: Aortic atherosclerosis.

No evidence of pulmonary embolus.

Large loculated right pleural effusion is noted.

## 2017-01-28 MED ORDER — VANCOMYCIN HCL 10 G IV SOLR
1500.0000 mg | Freq: Once | INTRAVENOUS | Status: AC
  Administered 2017-01-28: 1500 mg via INTRAVENOUS
  Filled 2017-01-28: qty 1500

## 2017-01-28 MED ORDER — PIPERACILLIN-TAZOBACTAM 3.375 G IVPB 30 MIN
3.3750 g | Freq: Once | INTRAVENOUS | Status: AC
  Administered 2017-01-28: 3.375 g via INTRAVENOUS
  Filled 2017-01-28: qty 50

## 2017-01-28 MED ORDER — SODIUM CHLORIDE 0.9% FLUSH
3.0000 mL | Freq: Two times a day (BID) | INTRAVENOUS | Status: DC
  Administered 2017-01-28 – 2017-01-30 (×5): 3 mL via INTRAVENOUS

## 2017-01-28 MED ORDER — HEPARIN SODIUM (PORCINE) 5000 UNIT/ML IJ SOLN
5000.0000 [IU] | Freq: Three times a day (TID) | INTRAMUSCULAR | Status: DC
  Administered 2017-01-28 – 2017-01-29 (×2): 5000 [IU] via SUBCUTANEOUS
  Filled 2017-01-28: qty 1

## 2017-01-28 MED ORDER — IOPAMIDOL (ISOVUE-370) INJECTION 76%
INTRAVENOUS | Status: AC
  Administered 2017-01-28: 100 mL
  Filled 2017-01-28: qty 100

## 2017-01-28 MED ORDER — MORPHINE SULFATE (PF) 4 MG/ML IV SOLN
2.0000 mg | INTRAVENOUS | Status: DC | PRN
  Administered 2017-01-28 – 2017-01-29 (×2): 2 mg via INTRAVENOUS
  Filled 2017-01-28 (×2): qty 1

## 2017-01-28 MED ORDER — ACETAMINOPHEN 650 MG RE SUPP: 650.0000 mg | Freq: Four times a day (QID) | RECTAL | Status: DC | PRN

## 2017-01-28 MED ORDER — PIPERACILLIN-TAZOBACTAM 3.375 G IVPB
3.3750 g | Freq: Three times a day (TID) | INTRAVENOUS | Status: DC
  Administered 2017-01-29 (×2): 3.375 g via INTRAVENOUS
  Filled 2017-01-28 (×3): qty 50

## 2017-01-28 MED ORDER — VANCOMYCIN HCL IN DEXTROSE 750-5 MG/150ML-% IV SOLN
750.0000 mg | Freq: Two times a day (BID) | INTRAVENOUS | Status: DC
  Administered 2017-01-29: 750 mg via INTRAVENOUS
  Filled 2017-01-28: qty 150

## 2017-01-28 MED ORDER — IPRATROPIUM-ALBUTEROL 0.5-2.5 (3) MG/3ML IN SOLN
3.0000 mL | RESPIRATORY_TRACT | Status: AC
  Administered 2017-01-28 (×3): 3 mL via RESPIRATORY_TRACT
  Filled 2017-01-28: qty 3
  Filled 2017-01-28: qty 6

## 2017-01-28 MED ORDER — ALBUTEROL SULFATE (2.5 MG/3ML) 0.083% IN NEBU: 2.5000 mg | INHALATION_SOLUTION | Freq: Four times a day (QID) | RESPIRATORY_TRACT | Status: DC | PRN

## 2017-01-28 MED ORDER — MOMETASONE FURO-FORMOTEROL FUM 100-5 MCG/ACT IN AERO
2.0000 | INHALATION_SPRAY | Freq: Two times a day (BID) | RESPIRATORY_TRACT | Status: DC
  Administered 2017-01-29 – 2017-01-31 (×3): 2 via RESPIRATORY_TRACT
  Filled 2017-01-28 (×2): qty 8.8

## 2017-01-28 MED ORDER — SODIUM CHLORIDE 0.9 % IV BOLUS (SEPSIS)
1000.0000 mL | Freq: Once | INTRAVENOUS | Status: AC
  Administered 2017-01-28: 1000 mL via INTRAVENOUS

## 2017-01-28 MED ORDER — IPRATROPIUM-ALBUTEROL 0.5-2.5 (3) MG/3ML IN SOLN
3.0000 mL | RESPIRATORY_TRACT | Status: DC | PRN
  Administered 2017-01-29 – 2017-01-31 (×8): 3 mL via RESPIRATORY_TRACT
  Filled 2017-01-28 (×8): qty 3

## 2017-01-28 MED ORDER — PANTOPRAZOLE SODIUM 40 MG IV SOLR
40.0000 mg | INTRAVENOUS | Status: DC
  Administered 2017-01-28: 40 mg via INTRAVENOUS
  Filled 2017-01-28: qty 40

## 2017-01-28 MED ORDER — SODIUM CHLORIDE 0.9 % IV SOLN
INTRAVENOUS | Status: DC
Start: 2017-01-28 — End: 2017-01-29
  Administered 2017-01-28: 21:00:00 via INTRAVENOUS

## 2017-01-28 NOTE — ED Notes (Signed)
No urine output noted from condom cath, will continue to monitor for urine specimen.

## 2017-01-28 NOTE — ED Notes (Signed)
Pt's son leaving for the evening. Requested call if pt condition changes.  Mark: [phone]

## 2017-01-28 NOTE — ED Notes (Signed)
MD informed of placement of condom catheter on patient. States ok to wait for urine

## 2017-01-28 NOTE — ED Notes (Signed)
Pt requesting pain medication, admitting provider paged.

## 2017-01-28 NOTE — ED Triage Notes (Signed)
Pt presents for evaluation of sob today. Reports is hospice patient with DNR. Hospice nurse states was given 1 duoneb with no improvement. Pt reports productive cough. Given additional 1 duoneb and 125 mg solumedrol by EMS.

## 2017-01-28 NOTE — Progress Notes (Signed)
Transported pt from ED to 3MW6 without complications.

## 2017-01-28 NOTE — H&P (Signed)
[facility] Admission History and Physical Service Pager: [phone]  Patient name: [name] Medical record number: [id number] Date of birth: 12-Oct-1924 Age: 83 y.o. Gender: male  Primary Care Provider: Bufford Lope, DO Consultants: None Code Status: DNR  Chief Complaint: worsening cough and increasing O2 requirement  Assessment and Plan: [name] is a 83 y.o. male presenting with cough, hypoxia and fevers. PMH is significant for stage IV colorectal carcinoma, HTN, CAD, psoriais, OA, ischemic heart disease, MI, CHF (EF 45-50% with diffuse hypokinesis and G1DD on ECHO 06/08/16), asthma, dyslipidemia, hx of CABG, depression and GERD.  Acute Respiratory Failure. Concern for HCAP given fever, dyspnea, productive cough, and increased O2 requirement. Uses 2L O2 at home in setting of CHF but now requiring bipap. Received vancomycin and zosyn in the ED. Fevers, however, have been ongoing and may be due to progression of small bowel carcinoma. Also has history of asthma and has diffuse wheezing on exam. No edema on exam but does have crackles at lung bases. No evidence of effusion or infiltrate on CXR. CTA chest performed for concern for PE in setting of cancer, fever, and hypoxemia but was negative for PE. Did, however, show area of small airway impaction in RLL and irregular 10 mm pulmonary nodule of R lung not previously noted. Initial LA was not elevated but repeat mildly elevated at 2.08. No leukocytosis with WBC of 6.3 and thought to be able to mount some response with recent WBC as high as 11.2. Fever to 100.2 F.  - Admit to SDU, attending Dr. Mingo Amber - Continue bipap overnight as do not want to tire out with intubation not an option. Try wean in a.m. - Continue broad spectrum antibiotics with vanc and zosyn until blood cultures result  - Dulera 100-5 mcg/act 2 puffs BID (substitute for home symbicort) - Albuterol 2 puff q6h prn - Duonebs q2h prn - s/p  solumedrol 125 mg - Continue steroids in a.m. for possible asthma exacerbation with prednisone 60 mg x 4 more days - Oral care q2h - Obtain BNP - Neuro checks q4h to ensure no change in mentation  Fever. Concern for HCAP due to cough and wheezing. qSofa of 1 for increased RR. Recent UTI. Recent restaging showed progression of cancer, so this also could be cause.  - Blood cultures x 2 obtained - Obtain UA and urine culture (will be s/p antibiotics) - Rectal tylenol 650 mg q6h prn while NPO  Stage IV colorectal carcinoma. S/p 4 cycles of FOLFOX. Now enrolled in Hospice. Recent imaging showed progression of liver metastases and new metastasis to lung. CTA chest performed in ED mentioned irregular right lung pulmonary nodule not previously noted.  - Restart po oxy IR when able - IV morphine 2mg  q4h prn  Elevated troponin. I-stat 0.19 compared to 0.04 04/2016. Hx of CABG with CAD and HLD. S/p CABG in 1986 and repeat CABG in 1993. PCI in 2008 and 2010. 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 Suspect demand ischemia. Would not qualify for aggressive intervention given feeble medical status. EKG similar to previous with wide QRS and R axis deviation. - Restart daily aspirin 81 mg when able  - Will not trend troponins, as would not change management - Monitor on telemetry  Abdominal pain. Thought to be 2/2 progression of cancer with enlarging liver metastases. Taking oxycodone IR 5 mg (1-2 tablets) q4h prn per Hospice nurse. Having regular BMs though dose have to strain.  Stent to common bile duct placed last fall due to obstruction caused by adenoma of duodenum.  - IV morphine 2 mg q4h prn as substitute for PO pain medication - Restart on bowel regimen when able to take PO  Anemia. On admission Hbg 8.5, near baseline ~9.0. Known anemia of chronic disease and takes iron supplementation at home. FOBT negative.  - continue to trend Hbg - transfusion threshold <8 with cardiac  history  HTN. Normotensive on admission at 108/61-132/72.  - hold home regimen of imdur, metoprolol (NPO)  Chronic systolic HF. Last Echo on 03/2016 showing normal EF and diastolic dysfunction. Prior echos have showed G1DD with pEF.  Last followed with cardiology in August. - give IVF now considering recent poor PO intake; consider stopping after 12 hrs - not on diuretic at baseline - continue home metoprolol and imdur when able  Depression. Stable - Restart home Zoloft 150 mg daily when able  BPH - Restart Flomax 0.4 mg daily and finasteride when able - monitor UOP  Insomnia - Continue home Melatonin 3 mg when able - Continue home Trazodone 50 mg QHS when able  GERD - Restart home prilosec and zantac when taking PO - IV protonix 40 mg daily  FEN/GI: NPO while on bipap, IV protonix Prophylaxis: SQ heparin  Disposition: Plan to transition to PO antibiotics and supplemental O2 to return home with hospice care.   History of Present Illness:  [name] is an 83 y.o. male presenting with increased oxygen requirement, cough, and fever for last 2 days. He is normally on 2L at home for chronic systolic heart failure. Symptoms began 2 days ago. He was seen by his hospice nurse and started on doxycycline yesterday for possible pneumonia but was worse today with more productive cough and requiring increasing amounts of oxygen. He was given 1 duoneb at home without improvement and an additional treatment along with 125 mg of solumedrol by EMS. He required bipap in the ED to maintain O2 sats. He enrolled in hospice 2 days ago after trying palliative chemotherapy because pain was becoming harder to control. New prescriptions include oxycodone IR 5 mg and lorazepam 0.5 mg.   Wife also reports patient has not been eating well, though he will take ensure drinks. Patient denies chest pain but does report burning epigastric pain that he gets from GERD. He has been feeling weaker as well. No  n/v. No blood in stool. Denies sick contacts.   Per recent Symptom Management Clinic notes from the [facility] on 5/21, patient had been having weakness, burning with urination, and fever with chills. He completed a 7-day course of bactrim for E. coli UTI. He has also been having regular fevers at home and taking nightly tylenol.  Restaging CT performed 5/29 showed progression of cancer with "new obstructing 6 mm right UVJ stone with mild right hydroureter nephrosis, mild L2 vertebral compression fracture, significant interval growth of bulky liver metastatic disease, new 3 mm solid left lower lobe pulmonary nodule, pulmonary metastasis not excluded, stable eccentric, periampullary duodenal wall thickening compatible with stable primary neoplasm, and aortic atherosclerosis." Enlargement of liver metastases thought to be source of worsening RUQ pain.   Review Of Systems: Per HPI with the following additions:   Review of Systems  Constitutional: Positive for chills and fever.  HENT: Negative for sore throat.   Respiratory: Positive for cough, sputum production, shortness of breath and wheezing. Negative for hemoptysis.   Cardiovascular: Negative for palpitations and leg  swelling.  Gastrointestinal: Positive for abdominal pain, constipation and heartburn. Negative for blood in stool, diarrhea and vomiting.  Genitourinary: Negative for dysuria and frequency.  Musculoskeletal: Negative for falls and myalgias.  Skin: Negative for rash.  Neurological: Positive for weakness.  Psychiatric/Behavioral: Positive for depression. The patient has insomnia.     Patient Active Problem List   Diagnosis Date Noted  . Respiratory failure ([place]) 01/28/2017  . Acute respiratory failure ([place]) 01/28/2017  . Abdominal pain 01/17/2017  . Hypercalcemia 01/17/2017  . Port catheter in place 12/02/2016  . Goals of care, counseling/discussion 11/19/2016  . Small bowel carcinoma ([place]) 11/19/2016  . Acute  on chronic respiratory failure with hypoxemia ([place]) 06/05/2016  . Loculated pleural effusion   . Pleural effusion on right   . Cough 06/02/2016  . HCAP (healthcare-associated pneumonia) 06/02/2016  . Chronic anemia   . Chronic systolic congestive heart failure ([place])   . Chronic bronchitis ([place])   . Common bile duct mass   . Fever 05/26/2016  . Pyrexia   . Biliary drain displacement   . 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 Cholangitis   . Peri-ampullary neoplasm   . Malnutrition of moderate degree 04/29/2016  . Common bile duct obstruction   . Elevated liver function tests   . Bacteremia due to Gram-negative bacteria 04/27/2016  . BPH (benign prostatic hyperplasia)   . Asthma, mild intermittent   . Insomnia 02/18/2016  . SOB (shortness of breath)   . Pacemaker 12/25/2015  . Atrial flutter, RVR 04/10/2015  . Unstable angina ([place]) 04/10/2015  . Coronary artery disease involving native coronary artery of native heart without angina pectoris 04/10/2015  . Urinary retention due to benign prostatic hyperplasia   . Systolic CHF ([place]) 27/78/2423  . Depression 09/26/2013  . Intertrochanteric fracture of right hip ([place]) 08/29/2013  . Hypertension   . GERD (gastroesophageal reflux disease)   . COPD (chronic obstructive pulmonary disease) ([place])   . H/O cardiac arrest   . Junctional bradycardia 07/25/2012  . Glucose intolerance (impaired glucose tolerance) 07/19/2012  . Normocytic anemia 07/19/2012  . Acute MI, anterolateral wall ([place]) 07/16/2012  . Prostatitis 07/16/2012  . E. coli UTI (urinary tract infection) 07/16/2012  . Elevated transaminase level 07/16/2012  . Foot drop, right 07/28/2011  . LBBB (left bundle branch block) 03/29/2011  . Hx of CABG and re-do CABG  03/29/2011  . Ischemic heart disease   . Myocardial infarction ([place])   . Acute systolic congestive heart failure, NYHA class 2-EF 40-45%   . Chronic respiratory failure ([place])   . Chronic asthma   . Peripheral neuropathy   .  Dyslipidemia (high LDL; low HDL)   . Essential hypertension 04/04/2007  . PSORIASIS 04/04/2007  . OSTEOARTHRITIS 04/04/2007  . OSTEOPOROSIS 04/04/2007  . Coronary artery disease due to lipid rich plaque 01/05/2007  . HERNIA 01/05/2007    Past Medical History: Past Medical History:  Diagnosis Date  . Anemia 2014   was on iron pills and then was able to come off  . Arthritis    "all over"  . Asthma       . Atrial flutter ([place])    a. diagnosed 03/2015  . Bacteremia 03/2016  . Complication of anesthesia    B/P dropped low and had to stay in recovery longer  . Coronary artery disease    a. s/p CABG 1986 b. redo CABG 1993 c. PTCA 2008 d. DES 2010  . Depression       . Diverticulosis   .  GERD (gastroesophageal reflux disease)       . Hepatitis    hepatitis- long time ago, occupational contamination   . Hiatal hernia   . History of kidney stones   . Hyperlipemia   . Hypertension       . Insomnia       . Myocardial infarction ([place]) LAST 2010   MI 1986 WITH CABG DONE  . On home oxygen therapy    "2L; just at night" (01/31/2015)  . Peripheral neuropathy    TOES BOTH FEET  . Peripheral vascular disease (HCC)    thrombus- in leg- many yrs. ago- ?R- coumadin x1 mth  . Pneumonia 1960's AND FALL 2017  . Presence of permanent cardiac pacemaker   . Prostate cancer [facility])    radiation therapy- 2002  . Systolic CHF ([place])       . VT (ventricular tachycardia) ([place]) 06/2012   a. in the setting of STEMI b. on amio during hospitalization    Past Surgical History: Past Surgical History:  Procedure Laterality Date  . APPENDECTOMY  1943  . BILIARY DRAINAGE  05/14/2016  . CATARACT EXTRACTION W/ INTRAOCULAR LENS  IMPLANT, BILATERAL    . COLONOSCOPY    . CORONARY ARTERY BYPASS GRAFT  1986;  1993   CABG X 5; CABG X 1  . CYSTOSCOPY W/ URETERAL STENT PLACEMENT Right 02/28/2013   Procedure: CYSTOSCOPY WITH RETROGRADE PYELOGRAM/URETERAL STENT PLACEMENT;  Surgeon: Dutch Gray, MD;  Location:  [facility];  Service: Urology;  Laterality: Right;  . CYSTOSCOPY WITH URETEROSCOPY AND STENT PLACEMENT Right 03/23/2013   Procedure: RIGHT URETEROSCOPY, LASER LITHO AND STENT PLACEMENT;  Surgeon: Fredricka Bonine, MD;  Location: [facility];  Service: Urology;  Laterality: Right;  . EP IMPLANTABLE DEVICE N/A 04/11/2015   Procedure: Pacemaker Implant;  Surgeon: Will Meredith Leeds, MD;  Location: [place] CV LAB;  Service: Cardiovascular;  Laterality: N/A;  . ERCP N/A 04/30/2016   Procedure: ENDOSCOPIC RETROGRADE CHOLANGIOPANCREATOGRAPHY (ERCP);  Surgeon: Milus Banister, MD;  Location: [place];  Service: Endoscopy;  Laterality: N/A;  . ESOPHAGOGASTRODUODENOSCOPY (EGD) WITH PROPOFOL N/A 10/28/2016   Procedure: ESOPHAGOGASTRODUODENOSCOPY (EGD) WITH PROPOFOL;  Surgeon: Milus Banister, MD;  [facility];  Service: Endoscopy;  Laterality: N/A;  . FOOT FUSION Left 2000   heel  . FRACTURE SURGERY     heel- crushed -2002,  (hardware)   . HAMMER TOE SURGERY Right    little toe  . HARDWARE REMOVAL Right 2002   great toe; Dr. Sharol Given  . HOLMIUM LASER APPLICATION Right 8/52/7782   Procedure: HOLMIUM LASER APPLICATION;  Surgeon: Fredricka Bonine, MD;  [facility];  Service: Urology;  Laterality: Right;  . INGUINAL HERNIA REPAIR Left 01/31/2015   Procedure: OPEN REPAIR RECURRENT LEFT INGUINAL HERNIA;  Surgeon: Fanny Skates, MD;  Location: [place];  Service: General;  Laterality: Left;  . INSERTION OF MESH Left 01/31/2015   Procedure: INSERTION OF MESH;  Surgeon: Fanny Skates, MD;  Location: [place];  Service: General;  Laterality: Left;  . INTRAMEDULLARY (IM) NAIL INTERTROCHANTERIC Right 08/29/2013   Procedure: INTRAMEDULLARY (IM) NAIL INTERTROCHANTRIC;  Surgeon: Marianna Payment, MD;  Location: [place];  Service: Orthopedics;  Laterality: Right;  . IR FLUORO GUIDE PORT INSERTION RIGHT  11/29/2016  . IR GENERIC HISTORICAL  05/01/2016   IR INT EXT BILIARY DRAIN WITH CHOLANGIOGRAM 05/01/2016  Corrie Mckusick, DO [facility]  . IR GENERIC HISTORICAL  06/01/2016   IR BILIARY STENT(S) EXISTING ACCESS INC DILATION CATH  EXCHANGE 06/01/2016 Corrie Mckusick, DO [facility]  . IR GENERIC HISTORICAL  05/20/2016   IR RADIOLOGIST EVAL & MGMT 05/20/2016 Greggory Keen, MD [facility]  . IR US GUIDE VASC ACCESS RIGHT  11/29/2016  . JOINT REPLACEMENT     R great toe  . KNEE ARTHROSCOPY Right X 2  . LEFT AND RIGHT HEART CATHETERIZATION WITH CORONARY ANGIOGRAM N/A 07/15/2012   Procedure: LEFT AND RIGHT HEART CATHETERIZATION WITH CORONARY ANGIOGRAM;  Surgeon: Jettie Booze, MD;  Location: [facility] C[facility] LAB;  Service: Cardiovascular;  Laterality: N/A;  . LUMBAR LAMINECTOMY/DECOMPRESSION MICRODISCECTOMY  08/10/2011   Procedure: LUMBAR LAMINECTOMY/DECOMPRESSION MICRODISCECTOMY;  Surgeon: Cooper Render Pool;  Location: [place] NEURO ORS;  Service: Neurosurgery;  Laterality: Right;  RIGHT Lumbar five-sacral one LAMINECTOMY, MICRODISCECTOMY  . LUMBAR WOUND DEBRIDEMENT  08/22/2011   Procedure: LUMBAR WOUND DEBRIDEMENT;  Surgeon: Eustace Moore;  Location: [place] NEURO ORS;  Service: Neurosurgery;  Laterality: N/A;  Repair of CSF Leak requiring laminectomy  . PERCUTANEOUS CORONARY STENT INTERVENTION (PCI-S) N/A 07/15/2012   Procedure: PERCUTANEOUS CORONARY STENT INTERVENTION (PCI-S);  Surgeon: Jettie Booze, MD;  Location: [facility] CATH LAB;  Service: Cardiovascular;  Laterality: N/A;  . RHINOPLASTY  [id number]  . RIGHT HIP PINNING   5 YRS AGO   DR SUE  . SQUAMOUS CELL AREA REMOVED FROM LEFT LOWER ARM  DEC 2017 AND MORE REMOVED 10-18-16 AND AREA FROM RIGHT EAR REMOVED IN PAST  . TEE WITHOUT CARDIOVERSION N/A 04/28/2016   Procedure: TRANSESOPHAGEAL ECHOCARDIOGRAM (TEE);  Surgeon: Skeet Latch, MD;  Location: [facility] ENDOSCOPY;  Service: Cardiovascular;  Laterality: N/A;  . TONSILLECTOMY  1940  . TRANSURETHRAL RESECTION OF PROSTATE  x2    Social History: Social History  Substance Use Topics  . Smoking status: Never Smoker   . Smokeless tobacco: Never Used  . Alcohol use No   Additional social history: Lives with wife and grandson Please also refer to relevant sections of EMR.  Family History: Family History  Problem Relation Age of Onset  . Heart attack Mother   . Addison's disease Father   . Heart attack Sister   . Heart disease Brother   . Prostate cancer Brother   . Heart disease Sister   . Heart disease Brother   . Lung cancer Brother   . Anesthesia problems Neg Hx   . Hypotension Neg Hx   . Malignant hyperthermia Neg Hx   . Pseudochol deficiency Neg Hx    Allergies and Medications: No Known Allergies No current facility-administered medications on file prior to encounter.    Current Outpatient Prescriptions on File Prior to Encounter  Medication Sig Dispense Refill  . acetaminophen (TYLENOL) 500 MG tablet Take 1,000 mg by mouth every 6 (six) hours as needed for mild pain, moderate pain or fever.     [name] aspirin EC 81 MG EC tablet Take 1 tablet (81 mg total) by mouth daily. (Patient taking differently: Take 81 mg by mouth at bedtime. )    . carbamazepine (TEGRETOL) 100 MG chewable tablet Chew 150 mg by mouth 2 (two) times daily.     [name] docusate sodium (COLACE) 100 MG capsule Take 100 mg by mouth at bedtime.     . feeding supplement, ENSURE ENLIVE, (ENSURE ENLIVE) LIQD Take 237 mLs by mouth 2 (two) times daily between meals. (Patient taking differently: Take 237 mLs by mouth 3 (three) times daily between meals. ) 237 mL 12  . ferrous sulfate 325 (65 FE) MG EC tablet Take 325 mg  by mouth 3 (three) times daily with meals.    . finasteride (PROSCAR) 5 MG tablet Take 5 mg by mouth daily.     [name] ipratropium-albuterol (DUONEB) 0.5-2.5 (3) MG/3ML SOLN Take 3 mLs by nebulization every 6 (six) hours as needed. (Patient taking differently: Take 3 mLs by nebulization every 6 (six) hours as needed (for shortness of breath). ) 360 mL 2  . isosorbide mononitrate (IMDUR) 30 MG 24 hr tablet Take 1 tablet (30 mg  total) by mouth daily. 30 tablet 0  . Melatonin 3 MG TABS Take 3 mg by mouth at bedtime.    . metoprolol succinate (TOPROL-XL) 12.5 mg TB24 24 hr tablet Take 12.5 mg by mouth daily.    . nitroGLYCERIN (NITROSTAT) 0.4 MG SL tablet Place 1 tablet (0.4 mg total) under the tongue every 5 (five) minutes as needed for chest pain (As needed for chest pain). 25 tablet 3  . omeprazole (PRILOSEC) 20 MG capsule Take 20 mg by mouth daily.    [name] oxyCODONE (OXY IR/ROXICODONE) 5 MG immediate release tablet Take 1-2 tablets (5-10 mg total) by mouth every 4 (four) hours as needed for severe pain. 60 tablet 0  . OXYGEN Inhale 2 L/min into the lungs continuous.     . potassium chloride SA (K-DUR,KLOR-CON) 20 MEQ tablet Take 20 mEq by mouth daily with lunch.     . prochlorperazine (COMPAZINE) 5 MG tablet TAKE 1 TABLET BY MOUTH EVERY 6 HOURS AS NEEDED FOR NAUSEA OR VOMITING (Patient taking differently: TAKE 1 TABLET (5 MG) BY MOUTH DAILY AT BEDTIME) 30 tablet 0  . ranitidine (ZANTAC) 150 MG tablet Take 1 tablet (150 mg total) by mouth 2 (two) times daily. (Patient taking differently: Take 300 mg by mouth daily with supper. ) 180 tablet 3  . senna (SENOKOT) 8.6 MG tablet Take 2 tablets by mouth at bedtime.     . sertraline (ZOLOFT) 100 MG tablet Take 150 mg by mouth daily.     . tamsulosin (FLOMAX) 0.4 MG CAPS capsule Take 1 capsule (0.4 mg total) by mouth daily. (Patient taking differently: Take 0.4 mg by mouth at bedtime. ) 30 capsule 0  . traZODone (DESYREL) 50 MG tablet TAKE ONE TABLET BY MOUTH AT BEDTIME 30 tablet 3  . albuterol (PROVENTIL HFA;VENTOLIN HFA) 108 (90 Base) MCG/ACT inhaler Inhale 2 puffs into the lungs every 6 (six) hours as needed for wheezing or shortness of breath. (Patient not taking: Reported on 01/28/2017) 1 Inhaler 0  . Amino Acids-Protein Hydrolys (FEEDING SUPPLEMENT, PRO-STAT SUGAR FREE 64,) LIQD Take 30 mLs by mouth 2 (two) times daily. (Patient not taking: Reported on 01/28/2017) 900 mL 0  .  budesonide-formoterol (SYMBICORT) 80-4.5 MCG/ACT inhaler Inhale 2 puffs into the lungs 2 (two) times daily. (Patient not taking: Reported on 01/28/2017)    . HYDROcodone-acetaminophen (NORCO/VICODIN) 5-325 MG tablet Take 1 tablet by mouth every 6 (six) hours as needed. (Patient not taking: Reported on 01/28/2017) 30 tablet 0  . ipratropium (ATROVENT) 0.02 % nebulizer solution Take 2.5 mLs (0.5 mg total) by nebulization 2 (two) times daily as needed for wheezing or shortness of breath. (Patient not taking: Reported on 01/28/2017) 75 mL 12  . lidocaine-prilocaine (EMLA) cream Apply cream to port one hour prior to port access and cover with plastic wrap (Patient not taking: Reported on 01/28/2017) 30 g 0  . sulfamethoxazole-trimethoprim (BACTRIM DS,SEPTRA DS) 800-160 MG tablet Take 1 tablet by mouth 2 (two) times daily. (Patient not taking: Reported on 01/28/2017) 14  tablet 0  . traMADol (ULTRAM) 50 MG tablet Take 1 tablet (50 mg total) by mouth every 6 (six) hours as needed. (Patient not taking: Reported on 01/25/2017) 60 tablet 0    Objective: BP 128/65   Pulse 86   Temp 100.2 F (37.9 C) (Rectal)   Resp 17   SpO2 97%  Exam: General: Frail appearing male, alert, very pleasant with bipap in place Eyes: PERRLA, EOMI ENTM: MM dry. No rhinorrhea. Neck: Supple. FROM. Cardiovascular: RRR, S1, S2. No m/r/g. Palpable DP and PT pulses bilaterally.  Respiratory: Diffuse expiratory wheezing with bibasilar crackles over posterior lung fields. No retractions. Able to speak over bipap.  Gastrointestinal: +BS, diffuse TTP with palpation, especially over RUQ and epigastric region.  MSK: Strength 5/5 of upper and lower extremities, able to sit up in bed without assistance. Has R foot drop (s/p surgery per wife).  Derm: Large nevus across left plantar surface of foot. No abrasions.  Neuro: AO to person, time but thought place [facility] hospital (though has been receiving his cancer treatments there). Answering questions  about his medical care appropriately. No focal deficits.  Psych: Normal mood and affect.   Labs and Imaging: CBC BMET   Recent Labs Lab 01/28/17 1510 01/28/17 1546  WBC 6.3  --   HGB 8.5* 8.5*  HCT 27.5* 25.0*  PLT 279  --     Recent Labs Lab 01/28/17 1510 01/28/17 1546  NA 135 135  K 4.8 4.6  CL 105 104  CO2 20*  --   BUN 17 18  CREATININE 0.83 0.70  GLUCOSE 141* 142*  CALCIUM 9.2  --      Ct Angio Chest Pe W Or Wo Contrast  Result Date: 01/28/2017 CLINICAL DATA:  Shortness of breath and productive cough. EXAM: CT ANGIOGRAPHY CHEST WITH CONTRAST TECHNIQUE: Multidetector CT imaging of the chest was performed using the standard protocol during bolus administration of intravenous contrast. Multiplanar CT image reconstructions and MIPs were obtained to evaluate the vascular anatomy. CONTRAST:  80 cc Isovue 370 COMPARISON:  06/03/2016 FINDINGS: Cardiovascular: The heart is enlarged. No pericardial effusion. Status post CABG. Atherosclerotic calcification is noted in the wall of the thoracic aorta. No filling defect in the opacified pulmonary arteries to suggest the presence of an acute pulmonary embolus. Right Port-A-Cath tip is positioned at the SVC/RA junction. Left-sided permanent pacemaker noted. Mediastinum/Nodes: No mediastinal lymphadenopathy. There is no hilar lymphadenopathy. The esophagus has normal imaging features. There is no axillary lymphadenopathy. Lungs/Pleura: Changes of emphysema noted bilaterally. There is bilateral dependent collapse/ consolidation. Areas of small airway impaction identified right lower lobe. 10 mm right lung pulmonary nodule seen image 48 series 7 not definitely visualized on prior study. 3 mm lingular nodule. Upper Abdomen: Common bile duct stent incompletely visualized. Pneumobilia compatible with the presence of the stent device. Large liver metastases again noted. Musculoskeletal: Bone windows reveal no worrisome lytic or sclerotic osseous lesions.  Review of the MIP images confirms the above findings. IMPRESSION: 1. No CT evidence for acute pulmonary embolus. 2. Emphysema. 3. 10 mm irregular right pulmonary nodule. Metastatic disease not excluded. 4. Liver metastases. Electronically Signed   By: Misty Stanley M.D.   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 EXAM: CT ABDOMEN AND PELVIS WITH CONTRAST TECHNIQUE: Multidetector CT imaging of the abdomen and pelvis was performed using the standard protocol  following bolus administration of intravenous contrast. CONTRAST:  19mL ISOVUE-300 IOPAMIDOL (ISOVUE-300) INJECTION 61% COMPARISON:  11/17/2016 CT abdomen/pelvis. FINDINGS: Lower chest: Anterior left lower lobe 3 mm solid pulmonary nodule (series 4/image 14) appears new since 06/03/2016 chest CT angiogram. Stable discontinuity in the lower most sternotomy wire. Coronary atherosclerosis status post CABG. Pacemaker lead tips are seen in the right atrium and right ventricular apex. Hepatobiliary: Liver is mildly enlarged by multiple (greater than 5) irregular low-attenuation liver masses, which have all increased in size in the interval. Representative liver masses as follows: - segment 6 right liver lobe 7.5 x 5.5 cm mass (series 2/image 39), increased from 4.8 x 3.7 cm - segment 7 right liver lobe 8.9 x 6.6 cm mass (series 2/image 22), increased from 4.5 x 3.8 cm - segment 8/4A anterior liver 12.0 x 6.3 cm mass (series 2/image 20), increased from 5.6 x 4.0 cm Stable well-positioned biliary stent in the lower common bile duct. Stable expected pneumobilia in the stable top-normal caliber intrahepatic bile ducts, common bile duct and gallbladder. Probable tiny layering gallstones in the nondistended gallbladder, with no gallbladder wall thickening or pericholecystic fluid. Pancreas: Stable diffuse  pancreatic duct dilation up to 13 mm diameter. Stable diffuse pancreatic parenchymal atrophy. No discrete pancreatic mass. Spleen: Normal size. No mass. Adrenals/Urinary Tract: No discrete adrenal nodules. Mild right hydroureteronephrosis with new 6 mm right ureterovesical junction stone. Simple renal cysts in both kidneys measuring up to the 3.1 cm in the posterior upper right kidney and 3.2 cm in the anterior upper left kidney. Additional subcentimeter hypodense renal cortical lesions in both kidneys are too small to characterize. No left hydronephrosis. Normal bladder. Stomach/Bowel: Grossly normal stomach. Stable mild eccentric wall thickening in the lower periampullary duodenal wall. Appendectomy. Mild sigmoid diverticulosis. No large bowel wall thickening or pericolonic fat stranding. Oral contrast reaches the transverse colon. Vascular/Lymphatic: Atherosclerotic ectatic 2.5 cm infrarenal abdominal aorta, unchanged. Patent portal, splenic, hepatic and renal veins. No pathologically enlarged lymph nodes in the abdomen or pelvis. Reproductive: Stable top-normal size prostate with nonspecific internal prostatic calcifications. Other: No pneumoperitoneum, ascites or focal fluid collection. Musculoskeletal: No aggressive appearing focal osseous lesions. Mild L2 vertebral compression fracture, new since 11/17/2016 CT. Moderate thoracolumbar spondylosis. IMPRESSION: 1. New obstructing 6 mm right UVJ stone with mild right hydroureteronephrosis. 2. Mild L2 vertebral compression fracture, new since 11/17/2016 CT. 3. Significant interval growth of bulky liver metastatic disease. 4. New 3 mm solid left lower lobe pulmonary nodule, pulmonary metastasis not excluded . 5. 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 Well-positioned lower common bile duct stent. Bile ducts are stable with expected pneumobilia. Probable cholelithiasis. 6. Aortic atherosclerosis. Stable  ectatic 2.5 cm infrarenal abdominal aorta. These results will be called to the ordering clinician or representative by the Radiologist Assistant, and communication documented in the PACS or zVision Dashboard. Electronically Signed   By: Ilona Sorrel M.D.   On: 01/25/2017 15:45   Dg Chest Port 1 View  Result Date: 01/28/2017 CLINICAL DATA:  Dyspnea, hypertension and asthma EXAM: PORTABLE CHEST 1 VIEW COMPARISON:  12/04/2016 FINDINGS: Stable cardiomegaly with aortic atherosclerosis. Status post CABG. Wall port catheter tip is seen at the cavoatrial junction. Left-sided pacemaker apparatus is visualized with right atrial and right ventricular leads. There is minimal atelectasis at the right lung base. No overt pulmonary edema, effusion or pneumothorax. IMPRESSION: Stable post CABG change, aortic atherosclerosis and cardiomegaly. Minimal atelectasis at the right lung base. Electronically Signed   By: Shanon Brow  Randel Pigg M.D.   On: 01/28/2017 15:52    Rogue Bussing, MD 01/28/2017, 8:26 PM PGY-2, [facility] pager: [phone], text pages welcome

## 2017-01-28 NOTE — ED Notes (Signed)
Elevated Troponin reported to Dr. Tyrone Nine

## 2017-01-28 NOTE — ED Provider Notes (Signed)
[facility] DEPT Provider Note   CSN: [id number] Arrival date & time: 01/28/17  1435     History   Chief Complaint Chief Complaint  Patient presents with  . Shortness of Breath    HPI [name] is a 83 y.o. male.  83 yo M with a chief complaint of cough congestion and fever. Going on for the past 4 days or so. Getting significantly worse. Patient was recently on palliative chemotherapy, for colorectal carcinoma. The patient was placed on hospice a couple days ago. Symptoms of gotten significantly worse. The patient is having extreme difficulty getting up and moving around. After discussion with their hospice providers they decided to come to the hospital for a couple days of IV antibiotics and hopefully back home for continued hospice care.   The history is provided by the patient and a relative. The history is limited by the condition of the patient.  Shortness of Breath  This is a new problem. The average episode lasts 4 days. The problem occurs continuously.The current episode started more than 2 days ago. The problem has been rapidly worsening. Associated symptoms include a fever and cough. Pertinent negatives include no headaches, no chest pain, no vomiting, no abdominal pain and no rash. He has tried nothing for the symptoms. The treatment provided no relief.    Past Medical History:  Diagnosis Date  . Anemia 2014   was on iron pills and then was able to come off  . Arthritis    "all over"  . Asthma       . Atrial flutter ([place])    a. diagnosed 03/2015  . Bacteremia 03/2016  . Complication of anesthesia    B/P dropped low and had to stay in recovery longer  . Coronary artery disease    a. s/p CABG 1986 b. redo CABG 1993 c. PTCA 2008 d. DES 2010  . Depression       . Diverticulosis   . GERD (gastroesophageal reflux disease)       . Hepatitis    hepatitis- long time ago, occupational contamination   . Hiatal hernia   . History of kidney stones   .  Hyperlipemia   . Hypertension       . Insomnia       . Myocardial infarction ([place]) LAST 2010   MI 1986 WITH CABG DONE  . On home oxygen therapy    "2L; just at night" (01/31/2015)  . Peripheral neuropathy    TOES BOTH FEET  . Peripheral vascular disease (HCC)    thrombus- in leg- many yrs. ago- ?R- coumadin x1 mth  . Pneumonia 1960's AND FALL 2017  . Presence of permanent cardiac pacemaker   . Prostate cancer [facility])    radiation therapy- 2002  . Systolic CHF ([place])       . VT (ventricular tachycardia) ([place]) 06/2012   a. in the setting of STEMI b. on amio during hospitalization    Patient Active Problem List   Diagnosis Date Noted  . Respiratory failure ([place]) 01/28/2017  . Acute respiratory failure ([place]) 01/28/2017  . Abdominal pain 01/17/2017  . Hypercalcemia 01/17/2017  . Port catheter in place 12/02/2016  . Goals of care, counseling/discussion 11/19/2016  . 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 Acute on chronic respiratory failure with hypoxemia ([place]) 06/05/2016  . Loculated pleural effusion   . Pleural effusion on right   . Cough 06/02/2016  . HCAP (healthcare-associated pneumonia) 06/02/2016  . Chronic anemia   .  Chronic systolic congestive heart failure ([place])   . Chronic bronchitis ([place])   . Common bile duct mass   . Fever 05/26/2016  . Pyrexia   . Biliary drain displacement   . 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 Cholangitis   . Peri-ampullary neoplasm   . Malnutrition of moderate degree 04/29/2016  . Common bile duct obstruction   . Elevated liver function tests   . Bacteremia due to Gram-negative bacteria 04/27/2016  . BPH (benign prostatic hyperplasia)   . Asthma, mild intermittent   . Insomnia 02/18/2016  . SOB (shortness of breath)   . Pacemaker 12/25/2015  . Atrial flutter, RVR 04/10/2015  . Unstable angina ([place]) 04/10/2015  . Coronary artery disease involving native coronary artery of native heart without angina pectoris 04/10/2015  . Urinary retention  due to benign prostatic hyperplasia   . Systolic CHF ([place]) 53/66/4403  . Depression 09/26/2013  . Intertrochanteric fracture of right hip ([place]) 08/29/2013  . Hypertension   . GERD (gastroesophageal reflux disease)   . COPD (chronic obstructive pulmonary disease) ([place])   . H/O cardiac arrest   . Junctional bradycardia 07/25/2012  . Glucose intolerance (impaired glucose tolerance) 07/19/2012  . Normocytic anemia 07/19/2012  . Acute MI, anterolateral wall ([place]) 07/16/2012  . Prostatitis 07/16/2012  . E. coli UTI (urinary tract infection) 07/16/2012  . Elevated transaminase level 07/16/2012  . Foot drop, right 07/28/2011  . LBBB (left bundle branch block) 03/29/2011  . Hx of CABG and re-do CABG  03/29/2011  . Ischemic heart disease   . Myocardial infarction ([place])   . Acute systolic congestive heart failure, NYHA class 2-EF 40-45%   . Chronic respiratory failure ([place])   . Chronic asthma   . Peripheral neuropathy   . Dyslipidemia (high LDL; low HDL)   . Essential hypertension 04/04/2007  . PSORIASIS 04/04/2007  . OSTEOARTHRITIS 04/04/2007  . OSTEOPOROSIS 04/04/2007  . Coronary artery disease due to lipid rich plaque 01/05/2007  . HERNIA 01/05/2007    Past Surgical History:  Procedure Laterality Date  . APPENDECTOMY  1943  . BILIARY DRAINAGE  05/14/2016  . CATARACT EXTRACTION W/ INTRAOCULAR LENS  IMPLANT, BILATERAL    . COLONOSCOPY    . CORONARY ARTERY BYPASS GRAFT  1986;  1993   CABG X 5; CABG X 1  . CYSTOSCOPY W/ URETERAL STENT PLACEMENT Right 02/28/2013   Procedure: CYSTOSCOPY WITH RETROGRADE PYELOGRAM/URETERAL STENT PLACEMENT;  Surgeon: Dutch Gray, MD;  Location: [facility];  Service: Urology;  Laterality: Right;  . CYSTOSCOPY WITH URETEROSCOPY AND STENT PLACEMENT Right 03/23/2013   Procedure: RIGHT URETEROSCOPY, LASER LITHO AND STENT PLACEMENT;  Surgeon: Fredricka Bonine, MD;  Location: [facility];  Service: Urology;  Laterality: Right;  . EP IMPLANTABLE DEVICE N/A 04/11/2015    Procedure: Pacemaker Implant;  Surgeon: Will Meredith Leeds, MD;  Location: [facility];  Service: Cardiovascular;  Laterality: N/A;  . ERCP N/A 04/30/2016   Procedure: ENDOSCOPIC RETROGRADE CHOLANGIOPANCREATOGRAPHY (ERCP);  Surgeon: Milus Banister, MD;  Location: [facility];  Service: Endoscopy;  Laterality: N/A;  . ESOPHAGOGASTRODUODENOSCOPY (EGD) WITH PROPOFOL N/A 10/28/2016   Procedure: ESOPHAGOGASTRODUODENOSCOPY (EGD) WITH PROPOFOL;  Surgeon: Milus Banister, MD;  Location: [facility];  Service: Endoscopy;  Laterality: N/A;  . FOOT FUSION Left 2000   heel  . FRACTURE SURGERY     heel- crushed -2002,  (hardware)   . HAMMER TOE SURGERY Right    little toe  . HARDWARE REMOVAL Right 2002   great toe; Dr. Sharol Given  .  HOLMIUM LASER APPLICATION Right 04/27/5620   Procedure: HOLMIUM LASER APPLICATION;  Surgeon: Fredricka Bonine, MD;  Location: [facility];  Service: Urology;  Laterality: Right;  . INGUINAL HERNIA REPAIR Left 01/31/2015   Procedure: OPEN REPAIR RECURRENT LEFT INGUINAL HERNIA;  Surgeon: Fanny Skates, MD;  Location: [facility];  Service: General;  Laterality: Left;  . INSERTION OF MESH Left 01/31/2015   Procedure: INSERTION OF MESH;  Surgeon: Fanny Skates, MD;  Location: [facility];  Service: General;  Laterality: Left;  . INTRAMEDULLARY (IM) NAIL INTERTROCHANTERIC Right 08/29/2013   Procedure: INTRAMEDULLARY (IM) NAIL INTERTROCHANTRIC;  Surgeon: Marianna Payment, MD;  Location: [facility];  Service: Orthopedics;  Laterality: Right;  . IR FLUORO GUIDE PORT INSERTION RIGHT  11/29/2016  . IR GENERIC HISTORICAL  05/01/2016   IR INT EXT BILIARY DRAIN WITH CHOLANGIOGRAM 05/01/2016 Corrie Mckusick, DO [facility]  . IR GENERIC HISTORICAL  06/01/2016   IR BILIARY STENT(S) EXISTING ACCESS INC DILATION CATH EXCHANGE 06/01/2016 Corrie Mckusick, DO [facility]  . IR GENERIC HISTORICAL  05/20/2016   IR RADIOLOGIST EVAL & MGMT 05/20/2016 Greggory Keen, MD [facility]  . IR US GUIDE VASC ACCESS RIGHT   11/29/2016  . JOINT REPLACEMENT     R great toe  . KNEE ARTHROSCOPY Right X 2  . LEFT AND RIGHT HEART CATHETERIZATION WITH CORONARY ANGIOGRAM N/A 07/15/2012   Procedure: LEFT AND RIGHT HEART CATHETERIZATION WITH CORONARY ANGIOGRAM;  Surgeon: Jettie Booze, MD;  Location[facility];  Service: Cardiovascular;  Laterality: N/A;  . LUMBAR LAMINECTOMY/DECOMPRESSION MICRODISCECTOMY  08/10/2011   Procedure: LUMBAR LAMINECTOMY/DECOMPRESSION MICRODISCECTOMY;  Surgeon: Cooper Render Pool;  Location: [facility];  Service: Neurosurgery;  Laterality: Right;  RIGHT Lumbar five-sacral one LAMINECTOMY, MICRODISCECTOMY  . LUMBAR WOUND DEBRIDEMENT  08/22/2011   Procedure: LUMBAR WOUND DEBRIDEMENT;  Surgeon: Eustace Moore;  Location: [facility];  Service: Neurosurgery;  Laterality: N/A;  Repair of CSF Leak requiring laminectomy  . PERCUTANEOUS CORONARY STENT INTERVENTION (PCI-S) N/A 07/15/2012   Procedure: PERCUTANEOUS CORONARY STENT INTERVENTION (PCI-S);  Surgeon: Jettie Booze, MD;  Location[facility] C[facility] LAB;  Service: Cardiovascular;  Laterality: N/A;  . RHINOPLASTY  [id number]  . RIGHT HIP PINNING   5 YRS AGO   DR SUE  . SQUAMOUS CELL AREA REMOVED FROM LEFT LOWER ARM  DEC 2017 AND MORE REMOVED 10-18-16 AND AREA FROM RIGHT EAR REMOVED IN PAST  . TEE WITHOUT CARDIOVERSION N/A 04/28/2016   Procedure: TRANSESOPHAGEAL ECHOCARDIOGRAM (TEE);  Surgeon: Skeet Latch, MD;  Location: [facility] ENDOSCOPY;  Service: Cardiovascular;  Laterality: N/A;  . TONSILLECTOMY  1940  . TRANSURETHRAL RESECTION OF PROSTATE  x2    OB History    No data available       Home Medications    Prior to Admission medications   Medication Sig Start Date End Date Taking? Authorizing Provider  acetaminophen (TYLENOL) 500 MG tablet Take 1,000 mg by mouth every 6 (six) hours as needed for mild pain, moderate pain or fever.    Yes [provider]  aspirin EC 81 MG EC tablet Take 1 tablet (81 mg total) by mouth daily. Patient  taking differently: Take 81 mg by mouth at bedtime.  04/12/15  Yes Kilroy, Doreene Burke, PA-C  carbamazepine (TEGRETOL) 100 MG chewable tablet Chew 150 mg by mouth 2 (two) times daily.    Yes [provider]  docusate sodium (COLACE) 100 MG capsule Take 100 mg by mouth at bedtime.    Yes [provider]  doxycycline (VIBRA-TABS) 100 MG tablet Take 100 mg by mouth 2 (two) times daily. 10 day course started 01/27/17 pm   Yes [provider]  feeding supplement, ENSURE ENLIVE, (ENSURE ENLIVE) LIQD Take 237 mLs by mouth 2 (two) times daily between meals. Patient taking differently: Take 237 mLs by mouth 3 (three) times daily between meals.  05/02/16  Yes Sela Hilding, MD  ferrous sulfate 325 (65 FE) MG EC tablet Take 325 mg by mouth 3 (three) times daily with meals.   Yes [provider]  finasteride (PROSCAR) 5 MG tablet Take 5 mg by mouth daily.    Yes [provider]  ipratropium-albuterol (DUONEB) 0.5-2.5 (3) MG/3ML SOLN Take 3 mLs by nebulization every 6 (six) hours as needed. Patient taking differently: Take 3 mLs by nebulization every 6 (six) hours as needed (for shortness of breath).  05/31/16  Yes Orson Eva J, DO  isosorbide mononitrate (IMDUR) 30 MG 24 hr tablet Take 1 tablet (30 mg total) by mouth daily. 05/02/16  Yes Sela Hilding, MD  LORazepam (ATIVAN) 0.5 MG tablet Take 0.5 mg by mouth every 4 (four) hours as needed (shortness of breath).   Yes [provider]  Melatonin 3 MG TABS Take 3 mg by mouth at bedtime.   Yes [provider]  metoprolol succinate (TOPROL-XL) 12.5 mg TB24 24 hr tablet Take 12.5 mg by mouth daily.   Yes [provider]  nitroGLYCERIN (NITROSTAT) 0.4 MG SL tablet Place 1 tablet (0.4 mg total) under the tongue every 5 (five) minutes as needed for chest pain (As needed for chest pain). 05/02/16  Yes Sela Hilding, MD  omeprazole (PRILOSEC) 20 MG capsule Take 20 mg by mouth daily.   Yes [provider]  oxyCODONE (OXY IR/ROXICODONE) 5 MG immediate release tablet Take 1-2 tablets (5-10 mg total) by mouth every 4 (four) hours as needed for severe pain. 01/25/17  Yes Curcio, Roselie Awkward, NP  OXYGEN Inhale 2 L/min into the lungs continuous.    Yes [provider]  potassium chloride SA (K-DUR,KLOR-CON) 20 MEQ tablet Take 20 mEq by mouth daily with lunch.    Yes [provider]  prochlorperazine (COMPAZINE) 5 MG tablet TAKE 1 TABLET BY MOUTH EVERY 6 HOURS AS NEEDED FOR NAUSEA OR VOMITING Patient taking differently: TAKE 1 TABLET (5 MG) BY MOUTH DAILY AT BEDTIME 12/27/16  Yes Ladell Pier, MD  ranitidine (ZANTAC) 150 MG tablet Take 1 tablet (150 mg total) by mouth 2 (two) times daily. Patient taking differently: Take 300 mg by mouth daily with supper.  06/21/16  Yes Bufford Lope, DO  senna (SENOKOT) 8.6 MG tablet Take 2 tablets by mouth at bedtime.    Yes [provider]  sertraline (ZOLOFT) 100 MG tablet Take 150 mg by mouth daily.    Yes [provider]  tamsulosin (FLOMAX) 0.4 MG CAPS capsule Take 1 capsule (0.4 mg total) by mouth daily. Patient taking differently: Take 0.4 mg by mouth at bedtime.  09/06/14  Yes Rumley, Washburn N, DO  traZODone (DESYREL) 50 MG tablet TAKE ONE TABLET BY MOUTH AT BEDTIME 12/27/16  Yes Yoo, Elsia J, DO  albuterol (PROVENTIL HFA;VENTOLIN HFA) 108 (90 Base) MCG/ACT inhaler Inhale 2 puffs into the lungs every 6 (six) hours as needed for wheezing or shortness of breath. Patient not taking: Reported on 01/28/2017 12/30/15   Mercy Riding, MD  Amino Acids-Protein Hydrolys (FEEDING SUPPLEMENT, PRO-STAT SUGAR FREE 64,) LIQD Take 30 mLs by  mouth 2 (two) times daily. Patient not taking: Reported on 01/28/2017 05/02/16   Sela Hilding, MD  budesonide-formoterol [facility]) 80-4.5 MCG/ACT inhaler Inhale 2 puffs into the lungs 2 (two) times daily. Patient not taking: Reported on 01/28/2017 09/12/12   Tanda Rockers, MD    HYDROcodone-acetaminophen (NORCO/VICODIN) 5-325 MG tablet Take 1 tablet by mouth every 6 (six) hours as needed. Patient not taking: Reported on 01/28/2017 01/21/17   Ladell Pier, MD  ipratropium (ATROVENT) 0.02 % nebulizer solution Take 2.5 mLs (0.5 mg total) by nebulization 2 (two) times daily as needed for wheezing or shortness of breath. Patient not taking: Reported on 01/28/2017 05/02/16   Sela Hilding, MD  lidocaine-prilocaine (EMLA) cream Apply cream to port one hour prior to port access and cover with plastic wrap Patient not taking: Reported on 01/28/2017 11/19/16   Ladell Pier, MD  sulfamethoxazole-trimethoprim (BACTRIM DS,SEPTRA DS) 800-160 MG tablet Take 1 tablet by mouth 2 (two) times daily. Patient not taking: Reported on 01/28/2017 01/25/17   Maryanna Shape, NP  traMADol (ULTRAM) 50 MG tablet Take 1 tablet (50 mg total) by mouth every 6 (six) hours as needed. Patient not taking: Reported on 01/25/2017 01/10/17   Ladell Pier, MD    Family History Family History  Problem Relation Age of Onset  . Heart attack Mother   . Addison's disease Father   . Heart attack Sister   . Heart disease Brother   . Prostate cancer Brother   . Heart disease Sister   . Heart disease Brother   . Lung cancer Brother   . Anesthesia problems Neg Hx   . Hypotension Neg Hx   . Malignant hyperthermia Neg Hx   . Pseudochol deficiency Neg Hx     Social History Social History  Substance Use Topics  . Smoking status: Never Smoker  . Smokeless tobacco: Never Used  . Alcohol use No     Allergies   Patient has no known allergies.   Review of Systems Review of Systems  Constitutional: Positive for fever. Negative for chills.  HENT: Negative for congestion and facial swelling.   Eyes: Negative for discharge and visual disturbance.  Respiratory: Positive for cough and shortness of breath.   Cardiovascular: Negative for chest pain and palpitations.  Gastrointestinal: Negative for  abdominal pain, diarrhea and vomiting.  Musculoskeletal: Negative for arthralgias and myalgias.  Skin: Negative for color change and rash.  Neurological: Negative for tremors, syncope and headaches.  Psychiatric/Behavioral: Negative for confusion and dysphoric mood.     Physical Exam Updated Vital Signs BP 121/67 (BP Location: Right Arm)   Pulse 83   Temp 97.9 F (36.6 C) (Oral)   Resp 19   Ht 5\' 7"  (1.702 m)   Wt 72.3 kg (159 lb 6.3 oz)   SpO2 92%   BMI 24.96 kg/m   Physical Exam  Constitutional: He is oriented to person, place, and time.  Cachectic, chronically ill-appearing  HENT:  Head: Normocephalic and atraumatic.  Eyes: EOM are normal. Pupils are equal, round, and reactive to light.  Neck: Normal range of motion. Neck supple. No JVD present.  Cardiovascular: Normal rate and regular rhythm.  Exam reveals no gallop and no friction rub.   No murmur heard. Pulmonary/Chest: He is in respiratory distress. He has wheezes. He exhibits no tenderness.  Tachypnea, diffuse rhonchi. Scattered wheezes.  Abdominal: He exhibits no distension and no mass. There is no tenderness. There is no rebound and no guarding.  Musculoskeletal: Normal  range of motion.  Neurological: He is alert and oriented to person, place, and time.  Skin: No rash noted. No pallor.  Psychiatric: He has a normal mood and affect. His behavior is normal.  Nursing note and vitals reviewed.    ED Treatments / Results  Labs (all labs ordered are listed, but only abnormal results are displayed) Labs Reviewed  CBC WITH DIFFERENTIAL/PLATELET - Abnormal; Notable for the following:       Result Value   RBC 2.99 (*)    Hemoglobin 8.5 (*)    HCT 27.5 (*)    RDW 18.9 (*)    All other components within normal limits  COMPREHENSIVE METABOLIC PANEL - Abnormal; Notable for the following:    CO2 20 (*)    Glucose, Bld 141 (*)    Total Protein 6.4 (*)    Albumin 2.2 (*)    AST 102 (*)    Alkaline Phosphatase 200  (*)    All other components within normal limits  I-STAT CHEM 8, ED - Abnormal; Notable for the following:    Glucose, Bld 142 (*)    Hemoglobin 8.5 (*)    HCT 25.0 (*)    All other components within normal limits  I-STAT TROPOININ, ED - Abnormal; Notable for the following:    Troponin i, poc 0.19 (*)    All other components within normal limits  I-STAT CG4 LACTIC ACID, ED - Abnormal; Notable for the following:    Lactic Acid, Venous 2.08 (*)    All other components within normal limits  I-STAT VENOUS BLOOD GAS, ED - Abnormal; Notable for the following:    pCO2, Ven 33.5 (*)    pO2, Ven 71.0 (*)    Bicarbonate 19.5 (*)    Acid-base deficit 5.0 (*)    All other components within normal limits  CULTURE, BLOOD (ROUTINE X 2)  CULTURE, BLOOD (ROUTINE X 2)  URINE CULTURE  URINALYSIS, ROUTINE W REFLEX MICROSCOPIC  COMPREHENSIVE METABOLIC PANEL  CBC  PROTIME-INR  BRAIN NATRIURETIC PEPTIDE  I-STAT CG4 LACTIC ACID, ED  POC OCCULT BLOOD, ED    EKG  EKG Interpretation  Date/Time:  Friday January 28 2017 14:44:15 EDT Ventricular Rate:  93 PR Interval:    QRS Duration: 170 QT Interval:  381 QTC Calculation: 474 R Axis:   136 Text Interpretation:  Sinus or ectopic atrial rhythm Short PR interval Nonspecific intraventricular conduction delay Repol abnrm, global ischemia, diffuse leads Baseline wander in lead(s) I II aVR with QRS widening Otherwise no significant change Confirmed by Deno Etienne [id number]) on 01/28/2017 4:10:14 PM       Radiology Ct Angio Chest Pe W Or Wo Contrast  Result Date: 01/28/2017 CLINICAL DATA:  Shortness of breath and productive cough. EXAM: CT ANGIOGRAPHY CHEST WITH CONTRAST TECHNIQUE: Multidetector CT imaging of the chest was performed using the standard protocol during bolus administration of intravenous contrast. Multiplanar CT image reconstructions and MIPs were obtained to evaluate the vascular anatomy. CONTRAST:  80 cc Isovue 370 COMPARISON:  06/03/2016  FINDINGS: Cardiovascular: The heart is enlarged. No pericardial effusion. Status post CABG. Atherosclerotic calcification is noted in the wall of the thoracic aorta. No filling defect in the opacified pulmonary arteries to suggest the presence of an acute pulmonary embolus. Right Port-A-Cath tip is positioned at the SVC/RA junction. Left-sided permanent pacemaker noted. Mediastinum/Nodes: No mediastinal lymphadenopathy. There is no hilar lymphadenopathy. The esophagus has normal imaging features. There is no axillary lymphadenopathy. Lungs/Pleura: Changes of emphysema noted bilaterally. There is  bilateral dependent collapse/ consolidation. Areas of small airway impaction identified right lower lobe. 10 mm right lung pulmonary nodule seen image 48 series 7 not definitely visualized on prior study. 3 mm lingular nodule. Upper Abdomen: Common bile duct stent incompletely visualized. Pneumobilia compatible with the presence of the stent device. Large liver metastases again noted. Musculoskeletal: Bone windows reveal no worrisome lytic or sclerotic osseous lesions. Review of the MIP images confirms the above findings. IMPRESSION: 1. No CT evidence for acute pulmonary embolus. 2. Emphysema. 3. 10 mm irregular right pulmonary nodule. Metastatic disease not excluded. 4. Liver metastases. Electronically Signed   By: Misty Stanley M.D.   On: 01/28/2017 17:46   Dg Chest Port 1 View  Result Date: 01/28/2017 CLINICAL DATA:  Dyspnea, hypertension and asthma EXAM: PORTABLE CHEST 1 VIEW COMPARISON:  12/04/2016 FINDINGS: Stable cardiomegaly with aortic atherosclerosis. Status post CABG. Wall port catheter tip is seen at the cavoatrial junction. Left-sided pacemaker apparatus is visualized with right atrial and right ventricular leads. There is minimal atelectasis at the right lung base. No overt pulmonary edema, effusion or pneumothorax. IMPRESSION: Stable post CABG change, aortic atherosclerosis and cardiomegaly. Minimal  atelectasis at the right lung base. Electronically Signed   By: Ashley Royalty M.D.   On: 01/28/2017 15:52    Procedures Procedures (including critical care time)  Medications Ordered in ED Medications  sodium chloride flush (NS) 0.9 % injection 3 mL (3 mLs Intravenous Given 01/28/17 2211)  heparin injection 5,000 Units (5,000 Units Subcutaneous Given 01/28/17 2208)  ipratropium-albuterol (DUONEB) 0.5-2.5 (3) MG/3ML nebulizer solution 3 mL (not administered)  pantoprazole (PROTONIX) injection 40 mg (40 mg Intravenous Given 01/28/17 2208)  0.9 %  sodium chloride infusion ( Intravenous New Bag/Given 01/28/17 2126)  mometasone-formoterol (DULERA) 100-5 MCG/ACT inhaler 2 puff (2 puffs Inhalation Not Given 01/28/17 2229)  albuterol (PROVENTIL) (2.5 MG/3ML) 0.083% nebulizer solution 2.5 mg (not administered)  morphine 4 MG/ML injection 2 mg (2 mg Intravenous Given 01/28/17 2116)  acetaminophen (TYLENOL) suppository 650 mg (not administered)  vancomycin (VANCOCIN) IVPB 750 mg/150 ml premix (not administered)  piperacillin-tazobactam (ZOSYN) IVPB 3.375 g (not administered)  vancomycin (VANCOCIN) 1,500 mg in sodium chloride 0.9 % 500 mL IVPB (0 mg Intravenous Stopped 01/28/17 1753)  piperacillin-tazobactam (ZOSYN) IVPB 3.375 g (0 g Intravenous Stopped 01/28/17 1615)  ipratropium-albuterol (DUONEB) 0.5-2.5 (3) MG/3ML nebulizer solution 3 mL (3 mLs Nebulization Given 01/28/17 1601)  sodium chloride 0.9 % bolus 1,000 mL (0 mLs Intravenous Stopped 01/28/17 1730)  iopamidol (ISOVUE-370) 76 % injection (100 mLs  Contrast Given 01/28/17 1725)     Initial Impression / Assessment and Plan / ED Course  I have reviewed the triage vital signs and the nursing notes.  Pertinent labs & imaging results that were available during my care of the patient were reviewed by me and considered in my medical decision making (see chart for details).     83 yo M With a chief complaint of cough shortness of breath and fever. Patient diagnosed  with pneumonia yesterday and started on doxycycline. On 2 L of oxygen normally now needing 8 at home. Febrile with tachypnea here. We'll broaden his antibiotics.The patient was started on BiPAP with significant improvement of his work breathing. CT scan of the chest negative for PE. Negative for pneumonia as well. Admit.   CRITICAL CARE Performed by: Cecilio Asper   Total critical care time: 35 minutes  Critical care time was exclusive of separately billable procedures and treating other patients.  Critical care  was necessary to treat or prevent imminent or life-threatening deterioration.  Critical care was time spent personally by me on the following activities: development of treatment plan with patient and/or surrogate as well as nursing, discussions with consultants, evaluation of patient's response to treatment, examination of patient, obtaining history from patient or surrogate, ordering and performing treatments and interventions, ordering and review of laboratory studies, ordering and review of radiographic studies, pulse oximetry and re-evaluation of patient's condition.  The patients results and plan were reviewed and discussed.   Any x-rays performed were independently reviewed by myself.   Differential diagnosis were considered with the presenting HPI.  Medications  sodium chloride flush (NS) 0.9 % injection 3 mL (3 mLs Intravenous Given 01/28/17 2211)  heparin injection 5,000 Units (5,000 Units Subcutaneous Given 01/28/17 2208)  ipratropium-albuterol (DUONEB) 0.5-2.5 (3) MG/3ML nebulizer solution 3 mL (not administered)  pantoprazole (PROTONIX) injection 40 mg (40 mg Intravenous Given 01/28/17 2208)  0.9 %  sodium chloride infusion ( Intravenous New Bag/Given 01/28/17 2126)  mometasone-formoterol (DULERA) 100-5 MCG/ACT inhaler 2 puff (2 puffs Inhalation Not Given 01/28/17 2229)  albuterol (PROVENTIL) (2.5 MG/3ML) 0.083% nebulizer solution 2.5 mg (not administered)  morphine 4  MG/ML injection 2 mg (2 mg Intravenous Given 01/28/17 2116)  acetaminophen (TYLENOL) suppository 650 mg (not administered)  vancomycin (VANCOCIN) IVPB 750 mg/150 ml premix (not administered)  piperacillin-tazobactam (ZOSYN) IVPB 3.375 g (not administered)  vancomycin (VANCOCIN) 1,500 mg in sodium chloride 0.9 % 500 mL IVPB (0 mg Intravenous Stopped 01/28/17 1753)  piperacillin-tazobactam (ZOSYN) IVPB 3.375 g (0 g Intravenous Stopped 01/28/17 1615)  ipratropium-albuterol (DUONEB) 0.5-2.5 (3) MG/3ML nebulizer solution 3 mL (3 mLs Nebulization Given 01/28/17 1601)  sodium chloride 0.9 % bolus 1,000 mL (0 mLs Intravenous Stopped 01/28/17 1730)  iopamidol (ISOVUE-370) 76 % injection (100 mLs  Contrast Given 01/28/17 1725)    Vitals:   01/28/17 2100 01/28/17 2130 01/28/17 2200 01/28/17 2247  BP: 115/67 118/62 106/62 121/67  Pulse: 84 83 82 83  Resp: 19 16 18 19   Temp:    97.9 F (36.6 C)  TempSrc:    Oral  SpO2: 96% 97% 97% 92%  Weight:    72.3 kg (159 lb 6.3 oz)  Height:    5\' 7"  (1.702 m)    Final diagnoses:  SOB (shortness of breath)  Acute respiratory failure with hypoxia (HCC)    Admission/ observation were discussed with the admitting physician, patient and/or family and they are comfortable with the plan.   Final Clinical Impressions(s) / ED Diagnoses   Final diagnoses:  SOB (shortness of breath)  Acute respiratory failure with hypoxia [facility])    New Prescriptions Current Discharge Medication List       Deno Etienne, DO 01/28/17 2322

## 2017-01-28 NOTE — ED Notes (Signed)
Explained to pt and family no orders available for pain medication at this time but admitting has been paged, pt family visibly upset and states "that's supposed to be a part of his orders that's the whole point". Explained to pt and family it can take time to finalize orders for admission but will continue to try and contact admitting for pain medication order.

## 2017-01-28 NOTE — Progress Notes (Signed)
Late entry: Visited patient at bedside around 11 pm. He remains AOx3 with O2 sats in high 90s on Bipap.

## 2017-01-28 NOTE — ED Notes (Signed)
Provider at the bedside.  

## 2017-01-28 NOTE — Progress Notes (Signed)
[facility] Liaison:  RN visit  Met with patient, wife and son at bedside in the Emergency room.  MD at bedside.  Patient was receiving breathing treatment as I arrived.  Patient still very short of breath, but states the breathing treatments make him feel better.  After discussion with MD and family, patient most likely to be admitted for pneumonia and will be treated with IV antibiotics.    Patient did present DNR form to EMS on arrival and has been given to ED.    Will continue to follow patient while in the hospital and anticipate discharge needs.    Thank you.  Edyth Gunnels, RN, [facility] Liaison [phone]

## 2017-01-28 NOTE — Progress Notes (Signed)
Pharmacy Antibiotic Note  [name] is a 83 y.o. male admitted on 01/28/2017 with pneumonia.  Pharmacy has been consulted for Vancomycin and Zosyn dosing.  Vancomycin 1500mg  and Zosyn 3.375gm given ~1545  Plan: Zosyn 3.375gm IV q8h - doses over 4 hours Vancomycin 750mg  IV q12h Will f/u micro data, renal function, and pt's clinical condition Vanc trough prn   Height: 5\' 7"  (170.2 cm) Weight: 159 lb 6.3 oz (72.3 kg) IBW/kg (Calculated) : 66.1  Temp (24hrs), Avg:98.8 F (37.1 C), Min:97.9 F (36.6 C), Max:100.2 F (37.9 C)   Recent Labs Lab 01/28/17 1510 01/28/17 1546 01/28/17 1847  WBC 6.3  --   --   CREATININE 0.83 0.70  --   LATICACIDVEN  --  1.82 2.08*    Estimated Creatinine Clearance (by C-G formula based on SCr of 0.7 mg/dL) Male: 46.4 mL/min Male: 58.5 mL/min    No Known Allergies  Antimicrobials this admission: 6/1 Vanc >>  6/1 Zosyn >>   Dose adjustments this admission: n/a  Microbiology results: 6/1 BCx x2:   UCx:    Thank you for allowing pharmacy to be a part of this patient's care.  Sherlon Handing, PharmD, BCPS Clinical pharmacist, pager [phone] 01/28/2017 11:03 PM

## 2017-01-29 DIAGNOSIS — E43 Unspecified severe protein-calorie malnutrition: Secondary | ICD-10-CM | POA: Insufficient documentation

## 2017-01-29 DIAGNOSIS — C19 Malignant neoplasm of rectosigmoid junction: Secondary | ICD-10-CM

## 2017-01-29 LAB — COMPREHENSIVE METABOLIC PANEL
ALBUMIN: 1.9 g/dL — AB (ref 3.5–5.0)
ALT: 36 U/L (ref 17–63)
ANION GAP: 7 (ref 5–15)
AST: 100 U/L — ABNORMAL HIGH (ref 15–41)
Alkaline Phosphatase: 174 U/L — ABNORMAL HIGH (ref 38–126)
BUN: 15 mg/dL (ref 6–20)
CHLORIDE: 109 mmol/L (ref 101–111)
CO2: 24 mmol/L (ref 22–32)
Calcium: 8.9 mg/dL (ref 8.9–10.3)
Creatinine, Ser: 0.7 mg/dL (ref 0.61–1.24)
GFR calc Af Amer: >60 mL/min (ref >60)
GFR calc non Af Amer: >60 mL/min (ref >60)
GLUCOSE: 91 mg/dL (ref 65–99)
POTASSIUM: 4.8 mmol/L (ref 3.5–5.1)
SODIUM: 140 mmol/L (ref 135–145)
Total Bilirubin: 0.5 mg/dL (ref 0.3–1.2)
Total Protein: 6.1 g/dL — ABNORMAL LOW (ref 6.5–8.1)

## 2017-01-29 LAB — CBC
HCT: 27.7 % — ABNORMAL LOW (ref 39.0–52.0)
HEMATOCRIT: 25.8 % — AB (ref 39.0–52.0)
HEMOGLOBIN: 7.9 g/dL — AB (ref 13.0–17.0)
HEMOGLOBIN: 8.5 g/dL — AB (ref 13.0–17.0)
MCH: 28.4 pg (ref 26.0–34.0)
MCH: 28.4 pg (ref 26.0–34.0)
MCHC: 30.6 g/dL (ref 30.0–36.0)
MCHC: 30.7 g/dL (ref 30.0–36.0)
MCV: 92.8 fL (ref 78.0–100.0)
MCV: 92.6 fL (ref 78.0–100.0)
Platelets: 283 10*3/uL (ref 150–400)
Platelets: 261 10*3/uL (ref 150–400)
RBC: 2.78 MIL/uL — ABNORMAL LOW (ref 4.22–5.81)
RBC: 2.99 MIL/uL — AB (ref 4.22–5.81)
RDW: 19.1 % — ABNORMAL HIGH (ref 11.5–15.5)
RDW: 19.3 % — ABNORMAL HIGH (ref 11.5–15.5)
WBC: 6.2 10*3/uL (ref 4.0–10.5)
WBC: 6.5 10*3/uL (ref 4.0–10.5)

## 2017-01-29 LAB — BRAIN NATRIURETIC PEPTIDE: B Natriuretic Peptide: 933.3 pg/mL — ABNORMAL HIGH (ref 0.0–100.0)

## 2017-01-29 LAB — URINALYSIS, ROUTINE W REFLEX MICROSCOPIC
BILIRUBIN URINE: NEGATIVE
Glucose, UA: NEGATIVE mg/dL
HGB URINE DIPSTICK: NEGATIVE
KETONES UR: NEGATIVE mg/dL
Leukocytes, UA: NEGATIVE
NITRITE: NEGATIVE
Protein, ur: NEGATIVE mg/dL
pH: 5 (ref 5.0–8.0)

## 2017-01-29 LAB — LACTIC ACID, PLASMA
LACTIC ACID, VENOUS: 1.2 mmol/L (ref 0.5–1.9)
Lactic Acid, Venous: 1.2 mmol/L (ref 0.5–1.9)

## 2017-01-29 LAB — PROTIME-INR
INR: 1.31
Prothrombin Time: 16.4 seconds — ABNORMAL HIGH (ref 11.4–15.2)

## 2017-01-29 LAB — MRSA PCR SCREENING: MRSA by PCR: NEGATIVE

## 2017-01-29 MED ORDER — MORPHINE SULFATE (PF) 4 MG/ML IV SOLN
2.0000 mg | INTRAVENOUS | Status: DC | PRN
  Administered 2017-01-29: 2 mg via INTRAVENOUS
  Filled 2017-01-29: qty 1

## 2017-01-29 MED ORDER — PREDNISONE 50 MG PO TABS
60.0000 mg | ORAL_TABLET | Freq: Every day | ORAL | Status: DC
  Administered 2017-01-30 – 2017-01-31 (×2): 60 mg via ORAL
  Filled 2017-01-29 (×2): qty 1

## 2017-01-29 MED ORDER — FUROSEMIDE 10 MG/ML IJ SOLN
20.0000 mg | Freq: Once | INTRAMUSCULAR | Status: AC
  Administered 2017-01-29: 20 mg via INTRAVENOUS
  Filled 2017-01-29: qty 2

## 2017-01-29 MED ORDER — DOCUSATE SODIUM 100 MG PO CAPS
100.0000 mg | ORAL_CAPSULE | Freq: Every day | ORAL | Status: DC
  Administered 2017-01-29 – 2017-01-30 (×2): 100 mg via ORAL
  Filled 2017-01-29 (×2): qty 1

## 2017-01-29 MED ORDER — FAMOTIDINE 20 MG PO TABS
20.0000 mg | ORAL_TABLET | Freq: Every day | ORAL | Status: DC
  Administered 2017-01-29 – 2017-01-30 (×2): 20 mg via ORAL
  Filled 2017-01-29 (×2): qty 1

## 2017-01-29 MED ORDER — SERTRALINE HCL 50 MG PO TABS
150.0000 mg | ORAL_TABLET | Freq: Every day | ORAL | Status: DC
  Administered 2017-01-29 – 2017-01-31 (×3): 150 mg via ORAL
  Filled 2017-01-29 (×4): qty 1

## 2017-01-29 MED ORDER — LEVOFLOXACIN 750 MG PO TABS
750.0000 mg | ORAL_TABLET | Freq: Every day | ORAL | Status: DC
  Administered 2017-01-29 – 2017-01-31 (×3): 750 mg via ORAL
  Filled 2017-01-29 (×3): qty 1

## 2017-01-29 MED ORDER — TRAZODONE HCL 50 MG PO TABS
50.0000 mg | ORAL_TABLET | Freq: Every day | ORAL | Status: DC
  Administered 2017-01-29 – 2017-01-30 (×2): 50 mg via ORAL
  Filled 2017-01-29 (×2): qty 1

## 2017-01-29 MED ORDER — ACETAMINOPHEN 500 MG PO TABS: 1000.0000 mg | ORAL_TABLET | Freq: Four times a day (QID) | ORAL | Status: DC | PRN

## 2017-01-29 MED ORDER — ENSURE ENLIVE PO LIQD
237.0000 mL | Freq: Three times a day (TID) | ORAL | Status: DC
  Administered 2017-01-29 – 2017-01-31 (×7): 237 mL via ORAL

## 2017-01-29 MED ORDER — PREDNISONE 20 MG PO TABS
60.0000 mg | ORAL_TABLET | Freq: Every day | ORAL | Status: DC
  Administered 2017-01-29: 60 mg via ORAL
  Filled 2017-01-29: qty 3

## 2017-01-29 MED ORDER — FINASTERIDE 5 MG PO TABS
5.0000 mg | ORAL_TABLET | Freq: Every day | ORAL | Status: DC
  Administered 2017-01-29 – 2017-01-31 (×3): 5 mg via ORAL
  Filled 2017-01-29 (×3): qty 1

## 2017-01-29 MED ORDER — PREDNISONE 20 MG PO TABS: 50.0000 mg | ORAL_TABLET | Freq: Every day | ORAL | Status: DC

## 2017-01-29 MED ORDER — ASPIRIN EC 81 MG PO TBEC
81.0000 mg | DELAYED_RELEASE_TABLET | Freq: Every day | ORAL | Status: DC
  Administered 2017-01-29 – 2017-01-30 (×2): 81 mg via ORAL
  Filled 2017-01-29 (×2): qty 1

## 2017-01-29 MED ORDER — IPRATROPIUM-ALBUTEROL 0.5-2.5 (3) MG/3ML IN SOLN
3.0000 mL | Freq: Once | RESPIRATORY_TRACT | Status: AC
  Administered 2017-01-29: 3 mL via RESPIRATORY_TRACT
  Filled 2017-01-29: qty 3

## 2017-01-29 MED ORDER — ORAL CARE MOUTH RINSE
15.0000 mL | Freq: Two times a day (BID) | OROMUCOSAL | Status: DC
  Administered 2017-01-29 – 2017-01-30 (×5): 15 mL via OROMUCOSAL

## 2017-01-29 MED ORDER — TAMSULOSIN HCL 0.4 MG PO CAPS
0.4000 mg | ORAL_CAPSULE | Freq: Every day | ORAL | Status: DC
  Administered 2017-01-29 – 2017-01-30 (×2): 0.4 mg via ORAL
  Filled 2017-01-29 (×2): qty 1

## 2017-01-29 MED ORDER — OXYCODONE HCL 5 MG PO TABS
5.0000 mg | ORAL_TABLET | ORAL | Status: DC | PRN
  Administered 2017-01-29 (×2): 10 mg via ORAL
  Administered 2017-01-29: 5 mg via ORAL
  Administered 2017-01-30 – 2017-01-31 (×4): 10 mg via ORAL
  Filled 2017-01-29 (×7): qty 2

## 2017-01-29 MED ORDER — SENNA 8.6 MG PO TABS
2.0000 | ORAL_TABLET | Freq: Every day | ORAL | Status: DC
  Administered 2017-01-29 – 2017-01-30 (×2): 17.2 mg via ORAL
  Filled 2017-01-29 (×2): qty 2

## 2017-01-29 MED ORDER — PANTOPRAZOLE SODIUM 40 MG PO TBEC
40.0000 mg | DELAYED_RELEASE_TABLET | Freq: Every day | ORAL | Status: DC
  Administered 2017-01-29 – 2017-01-31 (×3): 40 mg via ORAL
  Filled 2017-01-29 (×3): qty 1

## 2017-01-29 NOTE — Progress Notes (Signed)
Shane Spurr, MD and this RN agreed to trial patient off BiPAP. Patient was taken off BiPAP and switched to 3L [place]. O2 saturation at 99% with no complaints of SOB. Will continue to monitor.

## 2017-01-29 NOTE — Progress Notes (Signed)
[id number]-  [facility] RN Visit at 0815am.  This is a related and covered GIP admission of 01/28/2017 with [facility] diagnosis of Malignant Neoplasm of Small Intestines, per Dr. Clifton James Monguilod.  Patient has [place] DNR.  EMS activated after Patty, [facility] RN, went out to the home to see patient with respiratory distress.  Patient's O2 levels were in the 80's upon RN arrival to the home.  O2 was increased and patient was still in distress when EMS activated.  Visited with patient and son at bedside this morning.   Patient was alert and oriented this morning.  Patient was on a nasal cannula and states that he is feeling much better.  Patient used BIPAP overnight which allow for rest.  Patient was talking in full sentences today with some SOB.  Patient used suction intermittently to get rid of excess phlegm.    Patient currently receiving:  Mometasone-formoterol (DULERA) 100-5 MCG/ACT inhaler 2 puff, Dose 2 puff, BID via IN.  Continuous medications:  Piperacillin-tazobactam (ZOSYN) IVPB 3.375 g, Dose 3.375 g, Q8H via IV; vancomycin (VANCOCIN) IVPB 750 mg/150 ml premix, Dose 750 mg, Q12H via IV.  Patient received PRN medications:  Morphine 4 MG/ML injection 2 mg, Dose 2 mg, Q4H PRN via IV at 0529am.    Dr. Clifton James Monguilod and Dr. Electa Sniff were made aware of patient admission.  Transfer summary and medication list to be placed on shadow chart.  Will continue to monitor patient during hospitalization and anticipate any discharge needs.  Thank you,  Edyth Gunnels, RN, BSN [facility] Liaison [phone]   All hospital liaisons are now on [place].

## 2017-01-29 NOTE — Progress Notes (Signed)
Family Medicine Teaching Service Daily Progress Note Intern Pager: [phone]  Patient name: [name] Medical record number: [id number] Date of birth: February 23, 1925 Age: 83 y.o. Gender: male  Primary Care Provider: Bufford Lope, DO Consultants: None Code Status: DNR  Pt Overview and Major Events to Date:  6/1 - Admitted for acute respiratory failure, needed bipap 6/2 - off bipap  Assessment and Plan: [name] is a 83 y.o. male presenting with cough, hypoxia and fevers. PMH is significant for stage IV colorectal carcinoma, HTN, CAD, psoriais, OA, ischemic heart disease, MI, CHF (EF 45-50% with diffuse hypokinesis and G1DD on ECHO 06/08/16), asthma, dyslipidemia, hx of CABG, depression and GERD.  Acute Respiratory Failure. Improved. Off of bipap this morning. Remains well oriented. Differential includes HCAP, complication of progressing metastatic colorectal carcinoma, CHF exacerbation, and asthma exacerbation with significant wheezing on exam. No evidence of effusion or infiltrate on CXR. CTA chest negative for PE. BNP elevated to 933, up from 122 a year ago. - Admited to SDU, attending Dr. Mingo Amber; transfer to floor - Weaned off of bipap this a.m.; continue to wean O2 down to home 2L as able. - Transition to levaquin 750 mg x 4 more days for possible HCAP - Dulera 100-5 mcg/act 2 puffs BID (substitute for home symbicort) - Albuterol 2 puff q6h prn - Duonebs q2h prn - s/p solumedrol 125 mg - Prednisone 60 mg x 4 more days - IV morphine 2 mg q4h prn if has air hunger - IV lasix 20 mg  Fever. Concern for HCAP due to cough and wheezing. qSofa of 1 for increased RR. Recent UTI. Recent restaging showed progression of cancer, so this also could be cause.  - Blood cultures x 2 obtained - Obtain UA and urine culture (will be s/p antibiotics; check if resistant to levaquin) - Tylenol prn  Stage IV colorectal carcinoma. S/p 4 cycles of FOLFOX. Now enrolled in Hospice. Recent imaging  showed progression of liver metastases and new metastasis to lung. CTA chest performed in ED mentioned irregular right lung pulmonary nodule not previously noted.  - PO oxy IR 5-10 mg q4h prn  Elevated troponin. I-stat 0.19 compared to 0.04 04/2016. Hx of CABG with CAD and HLD. S/p CABG in 1986 and repeat CABG in 1993. PCI in 2008 and 2010. 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 Suspect demand ischemia.  - Restart daily aspirin 81 mg  - Monitor on telemetry  Abdominal pain. Thought to be 2/2 progression of cancer with enlarging liver metastases. Taking oxycodone IR 5 mg (1-2 tablets) q4h prn per Hospice nurse. Having regular BMs though dose have to strain. Stent to common bile duct placed last fall due to obstruction caused by adenoma of duodenum.  - PO oxy IR as above - Continue home bowel regimen  Anemia.7.9 this a.m. On admission Hbg 8.5, near baseline ~9.0. Known anemia of chronic disease and takes iron supplementation at home. FOBT negative.  - continue to trend Hbg - Recheck hgb later this afternoon; would consider transfusion if still < 8.0  HTN.Normotensive.  - Restart home regimen of imdur, metoprolol as able  Chronic systolic HF. Last Echo on 03/2016 showing normal EF and diastolic dysfunction. Prior echos have shown G1DD with pEF.  - Elevated BNP - IV lasix 20 mg - continue home metoprolol and imdur when able  Depression.Stable - Restarted home Zoloft 150 mg daily  BPH - Restarted Flomax 0.4 mg daily and finasteride  - monitor  UOP  Insomnia - Continue home Melatonin 3 mg  - Continue home Trazodone 50 mg QHS  GERD. Takes prilosec and zantac at home. - protonix and pepcid while hospitalized  FEN/GI: regular diet, protonix, pepcid Prophylaxis: SCDs  Disposition: Home with hospice services; transfer to floor bed  Subjective:  [name] feels his breathing is much better this morning. He requests another breathing treatment though  because he can tell he is wheezing. Feels that he does cough some with drinking. Aware of abdominal pain but says is doing okay.  Objective: Temp:  [97.7 F (36.5 C)-100.2 F (37.9 C)] 97.7 F (36.5 C) (06/02 0315) Pulse Rate:  [82-112] 83 (06/02 0315) Resp:  [14-27] 15 (06/02 0315) BP: (106-132)/(57-75) 122/69 (06/02 0315) SpO2:  [92 %-99 %] 98 % (06/02 0806) FiO2 (%):  [30 %] 30 % (06/02 0015) Weight:  [159 lb 6.3 oz (72.3 kg)] 159 lb 6.3 oz (72.3 kg) (06/01 2247) Physical Exam: General: Frail older man with Froid in place.  Cardiovascular: RRR, S1, S2, no mrg Respiratory: Expiratory wheezing throughout posterior fields but can hear good air entry. Some coarse breath sounds but no crackles. Transmitted upper airway sounds.  Abdomen: Diffusely TTP, worst over RUQ, but still soft. +BS Extremities: No LE edema, R foot drop  Laboratory:  Recent Labs Lab 01/28/17 1510 01/28/17 1546 01/29/17 0427  WBC 6.3  --  6.2  HGB 8.5* 8.5* 7.9*  HCT 27.5* 25.0* 25.8*  PLT 279  --  283    Recent Labs Lab 01/28/17 1510 01/28/17 1546 01/29/17 0427  NA 135 135 140  K 4.8 4.6 4.8  CL 105 104 109  CO2 20*  --  24  BUN 17 18 15   CREATININE 0.83 0.70 0.70  CALCIUM 9.2  --  8.9  PROT 6.4*  --  6.1*  BILITOT 0.5  --  0.5  ALKPHOS 200*  --  174*  ALT 36  --  36  AST 102*  --  100*  GLUCOSE 141* 142* 91   Ct Angio Chest Pe W Or Wo Contrast  Result Date: 01/28/2017 CLINICAL DATA:  Shortness of breath and productive cough. EXAM: CT ANGIOGRAPHY CHEST WITH CONTRAST TECHNIQUE: Multidetector CT imaging of the chest was performed using the standard protocol during bolus administration of intravenous contrast. Multiplanar CT image reconstructions and MIPs were obtained to evaluate the vascular anatomy. CONTRAST:  80 cc Isovue 370 COMPARISON:  06/03/2016 FINDINGS: Cardiovascular: The heart is enlarged. No pericardial effusion. Status post CABG. Atherosclerotic calcification is noted in the wall of the  thoracic aorta. No filling defect in the opacified pulmonary arteries to suggest the presence of an acute pulmonary embolus. Right Port-A-Cath tip is positioned at the SVC/RA junction. Left-sided permanent pacemaker noted. Mediastinum/Nodes: No mediastinal lymphadenopathy. There is no hilar lymphadenopathy. The esophagus has normal imaging features. There is no axillary lymphadenopathy. Lungs/Pleura: Changes of emphysema noted bilaterally. There is bilateral dependent collapse/ consolidation. Areas of small airway impaction identified right lower lobe. 10 mm right lung pulmonary nodule seen image 48 series 7 not definitely visualized on prior study. 3 mm lingular nodule. Upper Abdomen: Common bile duct stent incompletely visualized. Pneumobilia compatible with the presence of the stent device. Large liver metastases again noted. Musculoskeletal: Bone windows reveal no worrisome lytic or sclerotic osseous lesions. Review of the MIP images confirms the above findings. IMPRESSION: 1. No CT evidence for acute pulmonary embolus. 2. Emphysema. 3. 10 mm irregular right pulmonary nodule. Metastatic disease not excluded. 4. Liver metastases.  Electronically Signed   By: Misty Stanley M.D.   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 EXAM: CT ABDOMEN AND PELVIS WITH CONTRAST TECHNIQUE: Multidetector CT imaging of the abdomen and pelvis was performed using the standard protocol following bolus administration of intravenous contrast. CONTRAST:  160mL ISOVUE-300 IOPAMIDOL (ISOVUE-300) INJECTION 61% COMPARISON:  11/17/2016 CT abdomen/pelvis. FINDINGS: Lower chest: Anterior left lower lobe 3 mm solid pulmonary nodule (series 4/image 14) appears new since 06/03/2016 chest CT angiogram. Stable discontinuity in the lower most sternotomy wire. Coronary  atherosclerosis status post CABG. Pacemaker lead tips are seen in the right atrium and right ventricular apex. Hepatobiliary: Liver is mildly enlarged by multiple (greater than 5) irregular low-attenuation liver masses, which have all increased in size in the interval. Representative liver masses as follows: - segment 6 right liver lobe 7.5 x 5.5 cm mass (series 2/image 39), increased from 4.8 x 3.7 cm - segment 7 right liver lobe 8.9 x 6.6 cm mass (series 2/image 22), increased from 4.5 x 3.8 cm - segment 8/4A anterior liver 12.0 x 6.3 cm mass (series 2/image 20), increased from 5.6 x 4.0 cm Stable well-positioned biliary stent in the lower common bile duct. Stable expected pneumobilia in the stable top-normal caliber intrahepatic bile ducts, common bile duct and gallbladder. Probable tiny layering gallstones in the nondistended gallbladder, with no gallbladder wall thickening or pericholecystic fluid. Pancreas: Stable diffuse pancreatic duct dilation up to 13 mm diameter. Stable diffuse pancreatic parenchymal atrophy. No discrete pancreatic mass. Spleen: Normal size. No mass. Adrenals/Urinary Tract: No discrete adrenal nodules. Mild right hydroureteronephrosis with new 6 mm right ureterovesical junction stone. Simple renal cysts in both kidneys measuring up to the 3.1 cm in the posterior upper right kidney and 3.2 cm in the anterior upper left kidney. Additional subcentimeter hypodense renal cortical lesions in both kidneys are too small to characterize. No left hydronephrosis. Normal bladder. Stomach/Bowel: Grossly normal stomach. Stable mild eccentric wall thickening in the lower periampullary duodenal wall. Appendectomy. Mild sigmoid diverticulosis. No large bowel wall thickening or pericolonic fat stranding. Oral contrast reaches the transverse colon. Vascular/Lymphatic: Atherosclerotic ectatic 2.5 cm infrarenal abdominal aorta, unchanged. Patent portal, splenic, hepatic and renal veins. No pathologically  enlarged lymph nodes in the abdomen or pelvis. Reproductive: Stable top-normal size prostate with nonspecific internal prostatic calcifications. Other: No pneumoperitoneum, ascites or focal fluid collection. Musculoskeletal: No aggressive appearing focal osseous lesions. Mild L2 vertebral compression fracture, new since 11/17/2016 CT. Moderate thoracolumbar spondylosis. IMPRESSION: 1. New obstructing 6 mm right UVJ stone with mild right hydroureteronephrosis. 2. Mild L2 vertebral compression fracture, new since 11/17/2016 CT. 3. Significant interval growth of bulky liver metastatic disease. 4. New 3 mm solid left lower lobe pulmonary nodule, pulmonary metastasis not excluded . 5. 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 Well-positioned lower common bile duct stent. Bile ducts are stable with expected pneumobilia. Probable cholelithiasis. 6. Aortic atherosclerosis. Stable ectatic 2.5 cm infrarenal abdominal aorta. These results will be called to the ordering clinician or representative by the Radiologist Assistant, and communication documented in the PACS or zVision Dashboard. Electronically Signed   By: Ilona Sorrel M.D.   On: 01/25/2017 15:45   Dg Chest Port 1 View  Result Date: 01/28/2017 CLINICAL DATA:  Dyspnea, hypertension and asthma EXAM: PORTABLE CHEST 1 VIEW COMPARISON:  12/04/2016 FINDINGS: Stable cardiomegaly with aortic atherosclerosis.  Status post CABG. Wall port catheter tip is seen at the cavoatrial junction. Left-sided pacemaker apparatus is visualized with right atrial and right ventricular leads. There is minimal atelectasis at the right lung base. No overt pulmonary edema, effusion or pneumothorax. IMPRESSION: Stable post CABG change, aortic atherosclerosis and cardiomegaly. Minimal atelectasis at the right lung base. Electronically Signed   By: Ashley Royalty M.D.   On: 01/28/2017 15:52    Rogue Bussing, MD 01/29/2017, 8:16  AM PGY-2, [place] Intern pager: [phone], text pages welcome

## 2017-01-29 NOTE — Progress Notes (Signed)
Initial Nutrition Assessment  DOCUMENTATION CODES:   Severe malnutrition in context of chronic illness  INTERVENTION:   Ensure Enlive po TID, each supplement provides 350 kcal and 20 grams of protein  NUTRITION DIAGNOSIS:   Malnutrition (Severe) related to chronic illness, cancer and cancer related treatments as evidenced by severe depletion of body fat, severe depletion of muscle mass.  GOAL:   Patient will meet greater than or equal to 90% of their needs  MONITOR:   PO intake, Supplement acceptance, Labs, Weight trends  REASON FOR ASSESSMENT:   Malnutrition Screening Tool    ASSESSMENT:    83 yo male admitted with acute respiratory failure. Pt with metastatic colorectal cancer with progression of liver mets and new mets to lung. Pt with additional hx of HTN, CHF, depression, CAD/CABG  Family at bedside, pt alert but weak. Off Bipap at present. Pt reports he was able to eat some cereal at breakfast this AM. Pt has not been eating much at home except drinking 2-3 Ensure shakes.  Per Everlene Other RN, pt having difficulty tolerating water this AM, coughing with thin liquids,  taking meds in applesauce. Pt may benefit from SLP evaluation if issues persists  Family reports pt with 5-6 pound wt loss in past few months. 3% wt loss in 2 months per weight encounters (not significant for time frame)  Nutrition-Focused physical exam completed. Findings are severe fat depletion, moderate to severe muscle depletion, and no edema.   Labs: reviewed Meds: prednisone  Diet Order:  Diet regular Room service appropriate? Yes; Fluid consistency: Thin  Skin:  Reviewed, no issues  Last BM:  01/28/17  Height:   Ht Readings from Last 1 Encounters:  01/29/17 5\' 10"  (1.778 m)    Weight:   Wt Readings from Last 1 Encounters:  01/29/17 159 lb 6.3 oz (72.3 kg)    Ideal Body Weight:  75.5 kg  BMI:  Body mass index is 22.87 kg/m.  Estimated Nutritional Needs:   Kcal:  6803-2122  kcals  Protein:  88-100 g  Fluid:  >/= 1.8 L  EDUCATION NEEDS:   No education needs identified at this time  [place], [place], LDN [phone] Pager  [phone] Weekend/On-Call Pager

## 2017-01-29 NOTE — Progress Notes (Signed)
Pt arrived on unit. Pt A&O x4. Telemetry placed. Call bell within reach. Will continue to monitor.

## 2017-01-29 NOTE — Progress Notes (Signed)
Called report to [facility] on [facility] . Aware pt will come per bed to [facility] bed 11

## 2017-01-29 NOTE — Progress Notes (Signed)
Patient sleeping comfortably on bipap and sat'ing 99%. Has been asking appropriate questions of his nurse overnight. RT to assess off of bipap this a.m.

## 2017-01-29 NOTE — Progress Notes (Signed)
Pt transferred to [place] with all belongings per bed Family aware Pt has glasses on his face

## 2017-01-29 NOTE — Progress Notes (Signed)
RT NOTE:  Pt taken off BIPAP per MD request. RT will assist is needed.

## 2017-01-30 LAB — COMPREHENSIVE METABOLIC PANEL
ALBUMIN: 1.9 g/dL — AB (ref 3.5–5.0)
ALT: 40 U/L (ref 17–63)
AST: 92 U/L — AB (ref 15–41)
Alkaline Phosphatase: 192 U/L — ABNORMAL HIGH (ref 38–126)
Anion gap: 9 (ref 5–15)
BUN: 15 mg/dL (ref 6–20)
CHLORIDE: 104 mmol/L (ref 101–111)
CO2: 24 mmol/L (ref 22–32)
CREATININE: 0.59 mg/dL — AB (ref 0.61–1.24)
Calcium: 9.2 mg/dL (ref 8.9–10.3)
GFR calc Af Amer: >60 mL/min (ref >60)
GFR calc non Af Amer: >60 mL/min (ref >60)
GLUCOSE: 97 mg/dL (ref 65–99)
POTASSIUM: 3.9 mmol/L (ref 3.5–5.1)
SODIUM: 137 mmol/L (ref 135–145)
Total Bilirubin: 0.5 mg/dL (ref 0.3–1.2)
Total Protein: 6 g/dL — ABNORMAL LOW (ref 6.5–8.1)

## 2017-01-30 LAB — CBC
HCT: 28.2 % — ABNORMAL LOW (ref 39.0–52.0)
Hemoglobin: 8.6 g/dL — ABNORMAL LOW (ref 13.0–17.0)
MCH: 28.3 pg (ref 26.0–34.0)
MCHC: 30.5 g/dL (ref 30.0–36.0)
MCV: 92.8 fL (ref 78.0–100.0)
PLATELETS: 333 10*3/uL (ref 150–400)
RBC: 3.04 MIL/uL — AB (ref 4.22–5.81)
RDW: 19.2 % — AB (ref 11.5–15.5)
WBC: 7.5 10*3/uL (ref 4.0–10.5)

## 2017-01-30 LAB — URINE CULTURE: Culture: NO GROWTH

## 2017-01-30 MED ORDER — MAGNESIUM HYDROXIDE 400 MG/5ML PO SUSP
15.0000 mL | Freq: Every day | ORAL | Status: DC | PRN
Start: 2017-01-30 — End: 2017-01-31

## 2017-01-30 NOTE — Progress Notes (Signed)
Family Medicine Teaching Service Daily Progress Note Intern Pager: [phone]  Patient name: [name] Medical record number: [id number] Date of birth: 22-Nov-1924 Age: 83 y.o. Gender: male  Primary Care Provider: Bufford Lope, DO Consultants: None Code Status: DNR  Pt Overview and Major Events to Date:  6/1 - Admitted for acute respiratory failure, needed bipap 6/2 - off bipap  Assessment and Plan: [name] is a 83 y.o. male presenting with cough, hypoxia and fevers. PMH is significant for stage IV colorectal carcinoma, HTN, CAD, psoriais, OA, ischemic heart disease, MI, CHF (EF 45-50% with diffuse hypokinesis and G1DD on ECHO 06/08/16), asthma, dyslipidemia, hx of CABG, depression and GERD.  Acute Respiratory Failure. Improved. Off of bipap. Remains confused. Differential includes HCAP, complication of progressing metastatic colorectal carcinoma, CHF exacerbation, and asthma exacerbation with significant wheezing on exam. No evidence of effusion or infiltrate on CXR. CTA chest negative for PE. BNP elevated to 933, up from 122 a year ago. Currently on [place] at 3L (home 2L). - continue to wean O2 down to home 2L as able. - Levaquin 750 mg for possible HCAP - Dulera 100-5 mcg/act 2 puffs BID (substitute for home symbicort) - Albuterol 2 puff q6h prn - Duonebs q2h prn - s/p solumedrol 125 mg - Prednisone 60 mg x 3 more days - IV morphine 2 mg q4h prn if has air hunger - s/p IV lasix 20 mg x1  Fever. Concern for HCAP due to cough and wheezing. qSofa of 1 for increased RR. Recent UTI. Recent restaging showed progression of cancer, so this also could be cause. No fevers since being admitted. - Blood cultures x 2 obtained - NGTD - urine culture - NGTD - Tylenol prn  Stage IV colorectal carcinoma. S/p 4 cycles of FOLFOX. Now enrolled in Hospice. Recent imaging showed progression of liver metastases and new metastasis to lung. CTA chest performed in ED mentioned irregular right lung  pulmonary nodule not previously noted.  - PO oxy IR 5-10 mg q4h prn - Morphine 2mg  q4hrs prn -will discharge back home with [facility] hospice  Elevated troponin. I-stat 0.19 compared to 0.04 04/2016. Hx of CABG with CAD and HLD. S/p CABG in 1986 and repeat CABG in 1993. PCI in 2008 and 2010. 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 Suspect demand ischemia.  - Restart daily aspirin 81 mg  - Monitor on telemetry  Abdominal pain. Thought to be 2/2 progression of cancer with enlarging liver metastases. Taking oxycodone IR 5 mg (1-2 tablets) q4h prn per Hospice nurse. Having regular BMs though dose have to strain. Stent to common bile duct placed last fall due to obstruction caused by adenoma of duodenum.  - PO oxy IR as above - Continue home bowel regimen; milk of mag added  Anemia.8.6 this a.m, near baseline ~9.0. Known anemia of chronic disease and takes iron supplementation at home. FOBT negative.  - continue to trend Hbg  HTN.Normotensive.  - Restart home regimen of imdur, metoprolol as able  Chronic systolic HF. Last Echo on 03/2016 showing normal EF and diastolic dysfunction. Prior echos have shown G1DD with pEF. Weight and fluid status stable - Elevated BNP 933 - s/p IV lasix 20 mg x1 - continue home metoprolol and imdur when able  Depression.Stable - Restarted home Zoloft 150 mg daily  BPH - Restarted Flomax 0.4 mg daily and finasteride  - monitor UOP  Insomnia - Continue home Melatonin 3 mg  - Continue home Trazodone  50 mg QHS  GERD. Takes prilosec and zantac at home. - protonix and pepcid while hospitalized  FEN/GI: regular diet, protonix, pepcid Prophylaxis: SCDs  Disposition: Home with hospice services tomorrow  Subjective:  [name] feels his breathing is much better this morning. Breathing treatments are helping. He feels confused. Pain is controlled.  Objective: Temp:  [98.1 F (36.7 C)-99.8 F (37.7 C)] 98.6 F (37 C) (06/03  0540) Pulse Rate:  [90-96] 96 (06/03 0540) Resp:  [18-20] 18 (06/03 0540) BP: (115-144)/(57-70) 134/61 (06/03 0540) SpO2:  [90 %-98 %] 96 % (06/03 0540) Weight:  [159 lb 6.3 oz (72.3 kg)-160 lb 1.6 oz (72.6 kg)] 160 lb 1.6 oz (72.6 kg) (06/02 1728) Physical Exam: General: Frail older man with [place] in place sitting in bedside chair.  Cardiovascular: RRR, S1, S2, no mrg Respiratory: Scattered wheezing throughout posterior fields but can hear good air entry. Abdomen: Diffusely TTP, worst over RUQ, but still soft. +BS Extremities: No LE edema, R foot drop  Laboratory:  Recent Labs Lab 01/29/17 0427 01/29/17 1421 01/30/17 0440  WBC 6.2 6.5 7.5  HGB 7.9* 8.5* 8.6*  HCT 25.8* 27.7* 28.2*  PLT 283 261 333    Recent Labs Lab 01/28/17 1510 01/28/17 1546 01/29/17 0427 01/30/17 0440  NA 135 135 140 137  K 4.8 4.6 4.8 3.9  CL 105 104 109 104  CO2 20*  --  24 24  BUN 17 18 15 15   CREATININE 0.83 0.70 0.70 0.59*  CALCIUM 9.2  --  8.9 9.2  PROT 6.4*  --  6.1* 6.0*  BILITOT 0.5  --  0.5 0.5  ALKPHOS 200*  --  174* 192*  ALT 36  --  36 40  AST 102*  --  100* 92*  GLUCOSE 141* 142* 91 97   Ct Angio Chest Pe W Or Wo Contrast  Result Date: 01/28/2017 CLINICAL DATA:  Shortness of breath and productive cough. EXAM: CT ANGIOGRAPHY CHEST WITH CONTRAST TECHNIQUE: Multidetector CT imaging of the chest was performed using the standard protocol during bolus administration of intravenous contrast. Multiplanar CT image reconstructions and MIPs were obtained to evaluate the vascular anatomy. CONTRAST:  80 cc Isovue 370 COMPARISON:  06/03/2016 FINDINGS: Cardiovascular: The heart is enlarged. No pericardial effusion. Status post CABG. Atherosclerotic calcification is noted in the wall of the thoracic aorta. No filling defect in the opacified pulmonary arteries to suggest the presence of an acute pulmonary embolus. Right Port-A-Cath tip is positioned at the SVC/RA junction. Left-sided permanent pacemaker  noted. Mediastinum/Nodes: No mediastinal lymphadenopathy. There is no hilar lymphadenopathy. The esophagus has normal imaging features. There is no axillary lymphadenopathy. Lungs/Pleura: Changes of emphysema noted bilaterally. There is bilateral dependent collapse/ consolidation. Areas of small airway impaction identified right lower lobe. 10 mm right lung pulmonary nodule seen image 48 series 7 not definitely visualized on prior study. 3 mm lingular nodule. Upper Abdomen: Common bile duct stent incompletely visualized. Pneumobilia compatible with the presence of the stent device. Large liver metastases again noted. Musculoskeletal: Bone windows reveal no worrisome lytic or sclerotic osseous lesions. Review of the MIP images confirms the above findings. IMPRESSION: 1. No CT evidence for acute pulmonary embolus. 2. Emphysema. 3. 10 mm irregular right pulmonary nodule. Metastatic disease not excluded. 4. Liver metastases. Electronically Signed   By: Misty Stanley M.D.   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 EXAM: CT ABDOMEN AND PELVIS WITH CONTRAST TECHNIQUE: Multidetector CT imaging of the abdomen and pelvis was performed using the standard protocol following bolus administration of intravenous contrast. CONTRAST:  178mL ISOVUE-300 IOPAMIDOL (ISOVUE-300) INJECTION 61% COMPARISON:  11/17/2016 CT abdomen/pelvis. FINDINGS: Lower chest: Anterior left lower lobe 3 mm solid pulmonary nodule (series 4/image 14) appears new since 06/03/2016 chest CT angiogram. Stable discontinuity in the lower most sternotomy wire. Coronary atherosclerosis status post CABG. Pacemaker lead tips are seen in the right atrium and right ventricular apex. Hepatobiliary: Liver is mildly enlarged by multiple (greater than 5) irregular low-attenuation liver masses, which have  all increased in size in the interval. Representative liver masses as follows: - segment 6 right liver lobe 7.5 x 5.5 cm mass (series 2/image 39), increased from 4.8 x 3.7 cm - segment 7 right liver lobe 8.9 x 6.6 cm mass (series 2/image 22), increased from 4.5 x 3.8 cm - segment 8/4A anterior liver 12.0 x 6.3 cm mass (series 2/image 20), increased from 5.6 x 4.0 cm Stable well-positioned biliary stent in the lower common bile duct. Stable expected pneumobilia in the stable top-normal caliber intrahepatic bile ducts, common bile duct and gallbladder. Probable tiny layering gallstones in the nondistended gallbladder, with no gallbladder wall thickening or pericholecystic fluid. Pancreas: Stable diffuse pancreatic duct dilation up to 13 mm diameter. Stable diffuse pancreatic parenchymal atrophy. No discrete pancreatic mass. Spleen: Normal size. No mass. Adrenals/Urinary Tract: No discrete adrenal nodules. Mild right hydroureteronephrosis with new 6 mm right ureterovesical junction stone. Simple renal cysts in both kidneys measuring up to the 3.1 cm in the posterior upper right kidney and 3.2 cm in the anterior upper left kidney. Additional subcentimeter hypodense renal cortical lesions in both kidneys are too small to characterize. No left hydronephrosis. Normal bladder. Stomach/Bowel: Grossly normal stomach. Stable mild eccentric wall thickening in the lower periampullary duodenal wall. Appendectomy. Mild sigmoid diverticulosis. No large bowel wall thickening or pericolonic fat stranding. Oral contrast reaches the transverse colon. Vascular/Lymphatic: Atherosclerotic ectatic 2.5 cm infrarenal abdominal aorta, unchanged. Patent portal, splenic, hepatic and renal veins. No pathologically enlarged lymph nodes in the abdomen or pelvis. Reproductive: Stable top-normal size prostate with nonspecific internal prostatic calcifications. Other: No pneumoperitoneum, ascites or focal fluid collection. Musculoskeletal: No  aggressive appearing focal osseous lesions. Mild L2 vertebral compression fracture, new since 11/17/2016 CT. Moderate thoracolumbar spondylosis. IMPRESSION: 1. New obstructing 6 mm right UVJ stone with mild right hydroureteronephrosis. 2. Mild L2 vertebral compression fracture, new since 11/17/2016 CT. 3. Significant interval growth of bulky liver metastatic disease. 4. New 3 mm solid left lower lobe pulmonary nodule, pulmonary metastasis not excluded . 5. 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 Well-positioned lower common bile duct stent. Bile ducts are stable with expected pneumobilia. Probable cholelithiasis. 6. Aortic atherosclerosis. Stable ectatic 2.5 cm infrarenal abdominal aorta. These results will be called to the ordering clinician or representative by the Radiologist Assistant, and communication documented in the PACS or zVision Dashboard. Electronically Signed   By: Ilona Sorrel M.D.   On: 01/25/2017 15:45   Dg Chest Port 1 View  Result Date: 01/28/2017 CLINICAL DATA:  Dyspnea, hypertension and asthma EXAM: PORTABLE CHEST 1 VIEW COMPARISON:  12/04/2016 FINDINGS: Stable cardiomegaly with aortic atherosclerosis. Status post CABG. Wall port catheter tip is seen at the cavoatrial junction. Left-sided pacemaker apparatus is visualized with right atrial and right ventricular leads. There is minimal atelectasis at the right lung base. No overt pulmonary edema,  effusion or pneumothorax. IMPRESSION: Stable post CABG change, aortic atherosclerosis and cardiomegaly. Minimal atelectasis at the right lung base. Electronically Signed   By: Ashley Royalty M.D.   On: 01/28/2017 15:52    Katheren Shams, DO 01/30/2017, 8:01 AM PGY-3, [place] Intern pager: [phone], text pages welcome

## 2017-01-30 NOTE — Progress Notes (Signed)
[facility] ([facility]) GIP W/E SW visit  As previously documented, this is a related and covered GIP admission of 01/28/2017 with [facility] diagnosis of Malignant Neoplasm of Small Intestines per Dr. Alferd Patee. Patient has [place] DNR. EMS activated after Patty, [facility] RN, went out to the home to see patient with respiratory distress. Patient's O2 levels were in the 80's upon RN arrival to the home. O2 was increased and patient was still in distress when EMS activated.  Visited with patient at beside. He was receiving breathing treatment. Spouse and g-son present. Patient alert and oriented with some confusion. He explained confusion overnight and feels he will be less confused once home. Appreciate report from bedside RN and conversation with MD. Plan is for patient to return home tomorrow. [facility] RN is scheduled to see him at 2 PM.   [facility] hospital liaison will continue see daily and assist with planning.   Please do not hesitate to call with hospice related questions.   Thank you,  Erling Conte, LCSW [phone]

## 2017-01-31 DIAGNOSIS — C19 Malignant neoplasm of rectosigmoid junction: Secondary | ICD-10-CM

## 2017-01-31 DIAGNOSIS — I509 Heart failure, unspecified: Secondary | ICD-10-CM

## 2017-01-31 DIAGNOSIS — J441 Chronic obstructive pulmonary disease with (acute) exacerbation: Secondary | ICD-10-CM

## 2017-01-31 DIAGNOSIS — J9601 Acute respiratory failure with hypoxia: Secondary | ICD-10-CM

## 2017-01-31 DIAGNOSIS — E43 Unspecified severe protein-calorie malnutrition: Secondary | ICD-10-CM

## 2017-01-31 MED ORDER — LEVOFLOXACIN 750 MG PO TABS: 750.0000 mg | ORAL_TABLET | Freq: Every day | ORAL | 0 refills | Status: AC

## 2017-01-31 NOTE — Progress Notes (Signed)
CM arranged [facility] for transportation to home with oxygen. Address confirmed with pt with son and grandson @ bedside.  CM made nurse aware. Whitman Hero RN, BSN,CM

## 2017-01-31 NOTE — Progress Notes (Signed)
Family Medicine Teaching Service Daily Progress Note Intern Pager: [phone]  Patient name: [name] Medical record number: [id number] Date of birth: [birth year] Age: 83 y.o. Gender: male  Primary Care Provider: Bufford Lope, DO Consultants: None Code Status: DNR  Pt Overview and Major Events to Date:  6/1 - Admitted for acute respiratory failure, needed bipap 6/2 - off bipap  Assessment and Plan: [name] is a 83 y.o. male presenting with cough, hypoxia and fevers. PMH is significant for stage IV colorectal carcinoma, HTN, CAD, psoriais, OA, ischemic heart disease, MI, CHF (EF 45-50% with diffuse hypokinesis and G1DD on ECHO 06/08/16), asthma, dyslipidemia, hx of CABG, depression and GERD.  Acute Respiratory Failure. Improved s/p lasix 20 mg x1 and nebulizer treatments. Off of bipap. Currently on [place] at 4L (home 2L).  - continue to wean O2 down to home 2L as able. - Levaquin 750 mg for possible HCAP, total 7 days, last dose 02/04/17 - Dulera 100-5 mcg/act 2 puffs BID (substitute for home symbicort) - Albuterol 2 puff q6h prn - Duonebs q2h prn - s/p solumedrol 125 mg. Prednisone 60 mg x 2 more days - IV morphine 2 mg q4h prn if has air hunger  Fever, resolved. Concern for HCAP due to cough and wheezing. Recent restaging showed progression of cancer, so this also could be cause. No fevers since being admitted. - Blood cultures x 2 obtained - NGTD - urine culture - NGTD - Tylenol prn  Stage IV colorectal carcinoma. S/p 4 cycles of FOLFOX. Now enrolled in Hospice. Recent imaging showed progression of liver metastases and new metastases to lung.  - PO oxy IR 5-10 mg q4h prn - Morphine 2mg  q4hrs prn - Discharge back home with [facility] hospice today  Elevated troponin. I-stat 0.19 compared to 0.04 04/2016. Hx of CABG with CAD and HLD. S/p CABG in 1986 and repeat CABG in 1993. PCI in 2008 and 2010. 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 Suspect demand ischemia.   - Restart daily aspirin 81 mg  - Monitor on telemetry  Abdominal pain. Thought to be 2/2 progression of cancer with enlarging liver metastases. Taking oxycodone IR 5 mg (1-2 tablets) q4h prn per Hospice nurse. Having regular BMs though dose have to strain. Stent to common bile duct placed last fall due to obstruction caused by adenoma of duodenum.  - PO oxy IR as above - Continue home bowel regimen; milk of mag added  Anemia.Stable, near baseline ~9.0. Known anemia of chronic disease and takes iron supplementation at home. FOBT negative.  - continue to trend Hbg  HTN.Normotensive.  - Continue  home regimen of imdur, metoprolol as able  Chronic systolic HF. Last Echo on 03/2016 showing normal EF and diastolic dysfunction. Prior echos have shown G1DD with pEF. Weight and fluid status stable - s/p IV lasix 20 mg x1 - continue home metoprolol and imdur when able  Depression.Stable - Restarted home Zoloft 150 mg daily  BPH - Restarted Flomax 0.4 mg daily and finasteride  - monitor UOP  Insomnia - Continue home Melatonin 3 mg  - Continue home Trazodone 50 mg QHS  GERD. Takes prilosec and zantac at home. - protonix and pepcid while hospitalized  FEN/GI: regular diet, protonix, pepcid Prophylaxis: SCDs  Disposition: Home with hospice services today. Will touch base with CM regarding transportation home, patient may need ambulance for transport.  Subjective:  No acute events overnight. Wants to go home today. No new concerns.  Objective: Temp:  [98.1 F (36.7 C)-98.5 F (36.9 C)] 98.4 F (36.9 C) (06/04 0506) Pulse Rate:  [83-95] 89 (06/04 0506) Resp:  [16-18] 18 (06/04 0506) BP: (113-144)/(45-75) 119/45 (06/04 0506) SpO2:  [94 %-96 %] 96 % (06/04 0506) Physical Exam: General: Frail older man with [place] in place sitting in bedside chair.  Cardiovascular: RRR, S1, S2, no mrg Respiratory: Scattered wheezing throughout posterior fields but can hear good air  entry. Abdomen: Diffusely TTP, worst over RUQ, but still soft. +BS  Extremities: No LE edema, R foot drop  Laboratory:  Recent Labs Lab 01/29/17 0427 01/29/17 1421 01/30/17 0440  WBC 6.2 6.5 7.5  HGB 7.9* 8.5* 8.6*  HCT 25.8* 27.7* 28.2*  PLT 283 261 333    Recent Labs Lab 01/28/17 1510 01/28/17 1546 01/29/17 0427 01/30/17 0440  NA 135 135 140 137  K 4.8 4.6 4.8 3.9  CL 105 104 109 104  CO2 20*  --  24 24  BUN 17 18 15 15   CREATININE 0.83 0.70 0.70 0.59*  CALCIUM 9.2  --  8.9 9.2  PROT 6.4*  --  6.1* 6.0*  BILITOT 0.5  --  0.5 0.5  ALKPHOS 200*  --  174* 192*  ALT 36  --  36 40  AST 102*  --  100* 92*  GLUCOSE 141* 142* 91 97   Ct Angio Chest Pe W Or Wo Contrast Result Date: 01/28/2017 IMPRESSION: 1. No CT evidence for acute pulmonary embolus. 2. Emphysema. 3. 10 mm irregular right pulmonary nodule. Metastatic disease not excluded. 4. Liver metastases. Electronically Signed   By: Misty Stanley M.D.   On: 01/28/2017 17:46   Ct Abdomen Pelvis W Contrast Result Date: 01/25/2017  IMPRESSION: 1. New obstructing 6 mm right UVJ stone with mild right hydroureteronephrosis. 2. Mild L2 vertebral compression fracture, new since 11/17/2016 CT. 3. Significant interval growth of bulky liver metastatic disease. 4. New 3 mm solid left lower lobe pulmonary nodule, pulmonary metastasis not excluded . 5. 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 Well-positioned lower common bile duct stent. Bile ducts are stable with expected pneumobilia. Probable cholelithiasis. 6. Aortic atherosclerosis. Stable ectatic 2.5 cm infrarenal abdominal aorta. These results will be called to the ordering clinician or representative by the Radiologist Assistant, and communication documented in the PACS or zVision Dashboard. Electronically Signed   By: Ilona Sorrel M.D.   On: 01/25/2017 15:45   Dg Chest Port 1 View Result Date: 01/28/2017 IMPRESSION: Stable post  CABG change, aortic atherosclerosis and cardiomegaly. Minimal atelectasis at the right lung base. Electronically Signed   By: Ashley Royalty M.D.   On: 01/28/2017 15:52    Bufford Lope, DO 01/31/2017, 7:09 AM PGY-1, [place] Intern pager: [phone], text pages welcome

## 2017-01-31 NOTE — Discharge Summary (Signed)
[facility] Discharge Summary  Patient name: [name] Medical record number: [id number] Date of birth: 1924-12-06 Age: 83 y.o. Gender: male Date of Admission: 01/28/2017  Date of Discharge: 01/31/17 Admitting Physician: Alveda Reasons, MD  Primary Care Provider: Bufford Lope, DO Consultants: none  Indication for Hospitalization: Acute respiratory failure  Discharge Diagnoses/Problem List:  HCAP Stage IV colorectal carcinoma Abdominal pain Anemia HTN Chronic systolic HF Depression  BPH Insomnia GERD  Disposition: Home with hospice  Discharge Condition: Improved  Discharge Exam:  General: Frail older man with Turner in place sitting in bedside chair.  Cardiovascular: RRR, S1, S2, no mrg Respiratory: Scattered wheezing throughout posterior fields but can hear good air entry. Abdomen: Diffusely TTP, worst over RUQ, but still soft. +BS  Extremities: No LE edema  Brief Hospital Course:  [name] is a 83 y.o. male PMH is significant for stage IV colorectal carcinoma, HTN, CAD, psoriais, OA, ischemic heart disease, MI, CHF (EF 45-50% with diffuse hypokinesis and G1DD on ECHO 06/08/16), asthma, dyslipidemia, hx of CABG, depression and GERD who presented with cough, hypoxia and fevers concerning for HAP vs complication of progressing metastatic colorectal carcinoma given small airway impaction in RLL and irregular 10 mm pulmonary nodule of R lung on CTA chest. He required bipap and was started on broad spectrum antibiotics with clinical improvement. Was deescalated to po levaquin and continued to do well with scheduled nebulizer treatments though he had an increased O2 requirement 4L by nasal cannula as opposed to 2L prior to admission. He was discharged home under the care of hospice services to complete a 7 day course of antibiotics for possible HAP.   Issues for Follow Up:  1. Patient should continue taking Levaquin 750 mg qd for possible HCAP,  total 7 days, last dose 02/04/17.  Significant Procedures: none  Significant Labs and Imaging:   Recent Labs Lab 01/29/17 0427 01/29/17 1421 01/30/17 0440  WBC 6.2 6.5 7.5  HGB 7.9* 8.5* 8.6*  HCT 25.8* 27.7* 28.2*  PLT 283 261 333    Recent Labs Lab 01/28/17 1510 01/28/17 1546 01/29/17 0427 01/30/17 0440  NA 135 135 140 137  K 4.8 4.6 4.8 3.9  CL 105 104 109 104  CO2 20*  --  24 24  GLUCOSE 141* 142* 91 97  BUN 17 18 15 15   CREATININE 0.83 0.70 0.70 0.59*  CALCIUM 9.2  --  8.9 9.2  ALKPHOS 200*  --  174* 192*  AST 102*  --  100* 92*  ALT 36  --  36 40  ALBUMIN 2.2*  --  1.9* 1.9*     Results/Tests Pending at Time of Discharge: none  Discharge Medications:  Allergies as of 01/31/2017   No Known Allergies     Medication List    STOP taking these medications   doxycycline 100 MG tablet Commonly known as:  VIBRA-TABS   HYDROcodone-acetaminophen 5-325 MG tablet Commonly known as:  NORCO/VICODIN   lidocaine-prilocaine cream Commonly known as:  EMLA   sulfamethoxazole-trimethoprim 800-160 MG tablet Commonly known as:  BACTRIM DS,SEPTRA DS   traMADol 50 MG tablet Commonly known as:  ULTRAM     TAKE these medications   acetaminophen 500 MG tablet Commonly known as:  TYLENOL Take 1,000 mg by mouth every 6 (six) hours as needed for mild pain, moderate pain or fever.   albuterol 108 (90 Base) MCG/ACT inhaler Commonly known as:  PROVENTIL HFA;VENTOLIN HFA Inhale 2 puffs into the  lungs every 6 (six) hours as needed for wheezing or shortness of breath.   aspirin 81 MG EC tablet Take 1 tablet (81 mg total) by mouth daily. What changed:  when to take this   budesonide-formoterol 80-4.5 MCG/ACT inhaler Commonly known as:  SYMBICORT Inhale 2 puffs into the lungs 2 (two) times daily.   carbamazepine 100 MG chewable tablet Commonly known as:  TEGRETOL Chew 150 mg by mouth 2 (two) times daily.   docusate sodium 100 MG capsule Commonly known as:   COLACE Take 100 mg by mouth at bedtime.   feeding supplement (ENSURE ENLIVE) Liqd Take 237 mLs by mouth 2 (two) times daily between meals. What changed:  when to take this   feeding supplement (PRO-STAT SUGAR FREE 64) Liqd Take 30 mLs by mouth 2 (two) times daily.   ferrous sulfate 325 (65 FE) MG EC tablet Take 325 mg by mouth 3 (three) times daily with meals.   finasteride 5 MG tablet Commonly known as:  PROSCAR Take 5 mg by mouth daily.   ipratropium 0.02 % nebulizer solution Commonly known as:  ATROVENT Take 2.5 mLs (0.5 mg total) by nebulization 2 (two) times daily as needed for wheezing or shortness of breath.   ipratropium-albuterol 0.5-2.5 (3) MG/3ML Soln Commonly known as:  DUONEB Take 3 mLs by nebulization every 6 (six) hours as needed. What changed:  reasons to take this   isosorbide mononitrate 30 MG 24 hr tablet Commonly known as:  IMDUR Take 1 tablet (30 mg total) by mouth daily.   levofloxacin 750 MG tablet Commonly known as:  LEVAQUIN Take 1 tablet (750 mg total) by mouth daily.   LORazepam 0.5 MG tablet Commonly known as:  ATIVAN Take 0.5 mg by mouth every 4 (four) hours as needed (shortness of breath).   Melatonin 3 MG Tabs Take 3 mg by mouth at bedtime.   metoprolol succinate 12.5 mg Tb24 24 hr tablet Commonly known as:  TOPROL-XL Take 12.5 mg by mouth daily.   nitroGLYCERIN 0.4 MG SL tablet Commonly known as:  NITROSTAT Place 1 tablet (0.4 mg total) under the tongue every 5 (five) minutes as needed for chest pain (As needed for chest pain).   omeprazole 20 MG capsule Commonly known as:  PRILOSEC Take 20 mg by mouth daily.   oxyCODONE 5 MG immediate release tablet Commonly known as:  Oxy IR/ROXICODONE Take 1-2 tablets (5-10 mg total) by mouth every 4 (four) hours as needed for severe pain. Notes to patient:  Last dose given 8am this morning. Next dose can be given anytime after noon   OXYGEN Inhale 2 L/min into the lungs continuous.    potassium chloride SA 20 MEQ tablet Commonly known as:  K-DUR,KLOR-CON Take 20 mEq by mouth daily with lunch.   prochlorperazine 5 MG tablet Commonly known as:  COMPAZINE TAKE 1 TABLET BY MOUTH EVERY 6 HOURS AS NEEDED FOR NAUSEA OR VOMITING What changed:  See the new instructions.   ranitidine 150 MG tablet Commonly known as:  ZANTAC Take 1 tablet (150 mg total) by mouth 2 (two) times daily. What changed:  how much to take  when to take this   senna 8.6 MG tablet Commonly known as:  SENOKOT Take 2 tablets by mouth at bedtime.   sertraline 100 MG tablet Commonly known as:  ZOLOFT Take 150 mg by mouth daily.   tamsulosin 0.4 MG Caps capsule Commonly known as:  FLOMAX Take 1 capsule (0.4 mg total) by mouth daily. What  changed:  when to take this   traZODone 50 MG tablet Commonly known as:  DESYREL TAKE ONE TABLET BY MOUTH AT BEDTIME       Discharge Instructions: Please refer to Patient Instructions section of EMR for full details.  Patient was counseled important signs and symptoms that should prompt return to medical care, changes in medications, dietary instructions, activity restrictions, and follow up appointments.   Follow-Up Appointments: Follow-up Information    [facility].   Specialty:  Hospice and Palliative Medicine Contact information: [place] [id number] [place], [address], DO 01/31/2017, 10:08 PM PGY-1, [place]

## 2017-01-31 NOTE — Discharge Instructions (Signed)
Please continue taking your antibiotic (Levaquin) one tablet a day for the next four days. Your last dose will be on June 8.   Please continue taking all of your other medications as you were before your hospital admission.   [facility] and [facility] will continue to follow with you at home.

## 2017-02-02 LAB — CULTURE, BLOOD (ROUTINE X 2)
CULTURE: NO GROWTH
Culture: NO GROWTH
Special Requests: ADEQUATE
Special Requests: ADEQUATE

## 2017-02-08 ENCOUNTER — Telehealth: Payer: Self-pay | Admitting: Oncology

## 2017-02-08 NOTE — Telephone Encounter (Signed)
Patient's son called and cancelled all appointment and did not want to reschedule

## 2017-02-10 ENCOUNTER — Ambulatory Visit: Payer: Medicare Other | Admitting: Nurse Practitioner

## 2017-02-10 ENCOUNTER — Encounter: Payer: Medicare Other | Admitting: Nutrition

## 2017-02-10 ENCOUNTER — Other Ambulatory Visit: Payer: Medicare Other

## 2017-02-10 ENCOUNTER — Ambulatory Visit: Payer: Medicare Other

## 2017-02-11 ENCOUNTER — Telehealth: Payer: Self-pay | Admitting: Oncology

## 2017-02-11 NOTE — Telephone Encounter (Signed)
Horris Latino called from hospice to inform us that [name] passed this morning 6/15 @ 8:09 @ home

## 2017-02-27 DEATH — deceased

## 2017-04-13 ENCOUNTER — Other Ambulatory Visit: Payer: Self-pay | Admitting: Nurse Practitioner

## 2017-12-28 DEATH — deceased
